# Patient Record
Sex: Male | Born: 1985 | Race: Black or African American | Hispanic: No | Marital: Single
Health system: Midwestern US, Community
[De-identification: ages and names within clinical notes are randomized; demographics above are authoritative.]

## PROBLEM LIST (undated history)

## (undated) DIAGNOSIS — R591 Generalized enlarged lymph nodes: Secondary | ICD-10-CM

## (undated) DIAGNOSIS — J984 Other disorders of lung: Secondary | ICD-10-CM

## (undated) DIAGNOSIS — R6510 Systemic inflammatory response syndrome (SIRS) of non-infectious origin without acute organ dysfunction: Secondary | ICD-10-CM

## (undated) DIAGNOSIS — G932 Benign intracranial hypertension: Secondary | ICD-10-CM

## (undated) DIAGNOSIS — J189 Pneumonia, unspecified organism: Secondary | ICD-10-CM

## (undated) DIAGNOSIS — B451 Cerebral cryptococcosis: Secondary | ICD-10-CM

## (undated) DIAGNOSIS — G9340 Encephalopathy, unspecified: Secondary | ICD-10-CM

## (undated) DIAGNOSIS — R945 Abnormal results of liver function studies: Secondary | ICD-10-CM

## (undated) DIAGNOSIS — N179 Acute kidney failure, unspecified: Secondary | ICD-10-CM

## (undated) DIAGNOSIS — I749 Embolism and thrombosis of unspecified artery: Secondary | ICD-10-CM

## (undated) DIAGNOSIS — G039 Meningitis, unspecified: Secondary | ICD-10-CM

## (undated) DIAGNOSIS — R918 Other nonspecific abnormal finding of lung field: Secondary | ICD-10-CM

## (undated) DIAGNOSIS — E871 Hypo-osmolality and hyponatremia: Secondary | ICD-10-CM

## (undated) DIAGNOSIS — B457 Disseminated cryptococcosis: Secondary | ICD-10-CM

## (undated) DIAGNOSIS — K611 Rectal abscess: Secondary | ICD-10-CM

## (undated) DIAGNOSIS — E43 Unspecified severe protein-calorie malnutrition: Secondary | ICD-10-CM

## (undated) DIAGNOSIS — L270 Generalized skin eruption due to drugs and medicaments taken internally: Secondary | ICD-10-CM

## (undated) DIAGNOSIS — D649 Anemia, unspecified: Secondary | ICD-10-CM

## (undated) DIAGNOSIS — B20 Human immunodeficiency virus [HIV] disease: Secondary | ICD-10-CM

## (undated) DIAGNOSIS — J9600 Acute respiratory failure, unspecified whether with hypoxia or hypercapnia: Secondary | ICD-10-CM

## (undated) HISTORY — DX: Cerebral cryptococcosis: B45.1

## (undated) HISTORY — DX: Meningitis, unspecified: G03.9

## (undated) HISTORY — DX: Benign intracranial hypertension: G93.2

## (undated) HISTORY — DX: Hypo-osmolality and hyponatremia: E87.1

## (undated) HISTORY — DX: Pneumonia, unspecified organism: J18.9

## (undated) HISTORY — DX: Other disorders of lung: J98.4

## (undated) HISTORY — DX: Human immunodeficiency virus (HIV) disease: B20

## (undated) HISTORY — DX: Acute respiratory failure, unspecified whether with hypoxia or hypercapnia: J96.00

## (undated) HISTORY — DX: Encephalopathy, unspecified: G93.40

## (undated) HISTORY — DX: Anemia, unspecified: D64.9

## (undated) HISTORY — DX: Generalized enlarged lymph nodes: R59.1

## (undated) HISTORY — DX: Generalized skin eruption due to drugs and medicaments taken internally: L27.0

## (undated) HISTORY — DX: Unspecified severe protein-calorie malnutrition: E43

## (undated) HISTORY — DX: Hypomagnesemia: E83.42

## (undated) HISTORY — DX: Acute kidney failure, unspecified: N17.9

## (undated) HISTORY — DX: Disseminated cryptococcosis: B45.7

## (undated) HISTORY — DX: Other nonspecific abnormal finding of lung field: R91.8

## (undated) HISTORY — DX: Systemic inflammatory response syndrome (sirs) of non-infectious origin without acute organ dysfunction: R65.10

## (undated) HISTORY — PX: INCISION AND DRAINAGE PERIRECTAL ABSCESS: SHX1804

## (undated) HISTORY — DX: Embolism and thrombosis of unspecified artery: I74.9

## (undated) HISTORY — DX: Abnormal results of liver function studies: R94.5

---

## 2008-04-04 HISTORY — PX: FLEXIBLE SIGMOIDOSCOPY: SHX1649

## 2008-04-04 LAB — HM SIGMOIDOSCOPY

## 2011-06-20 DIAGNOSIS — G8929 Other chronic pain: Secondary | ICD-10-CM | POA: Diagnosis present

## 2016-04-04 ENCOUNTER — Emergency Department: Admit: 2016-04-04 | Payer: Self-pay | Primary: Family Medicine

## 2016-04-04 ENCOUNTER — Inpatient Hospital Stay
Admit: 2016-04-04 | Discharge: 2016-04-04 | Disposition: A | Discharge status: Home / Self Care | Payer: Self-pay | Attending: Emergency Medicine

## 2016-04-04 DIAGNOSIS — R1084 Generalized abdominal pain: Secondary | ICD-10-CM

## 2016-04-04 LAB — HEPATIC FUNCTION PANEL
A-G Ratio: 0.6 — ABNORMAL LOW (ref 0.8–1.7)
ALT (SGPT): 15 U/L — ABNORMAL LOW (ref 16–61)
AST (SGOT): 17 U/L (ref 15–37)
Albumin: 3.3 g/dL — ABNORMAL LOW (ref 3.4–5.0)
Alk. phosphatase: 83 U/L (ref 45–117)
Bilirubin, direct: 0.2 MG/DL (ref 0.0–0.2)
Bilirubin, total: 0.4 MG/DL (ref 0.2–1.0)
Globulin: 5.5 g/dL — ABNORMAL HIGH (ref 2.0–4.0)
Protein, total: 8.8 g/dL — ABNORMAL HIGH (ref 6.4–8.2)

## 2016-04-04 LAB — LIPASE: Lipase: 140 U/L (ref 73–393)

## 2016-04-04 LAB — URINALYSIS W/ RFLX MICROSCOPIC
Bilirubin: NEGATIVE
Blood: NEGATIVE
Glucose: NEGATIVE mg/dL
Ketone: NEGATIVE mg/dL
Leukocyte Esterase: NEGATIVE
Nitrites: NEGATIVE
Specific gravity: 1.026 (ref 1.005–1.030)
Urobilinogen: 2 EU/dL — ABNORMAL HIGH (ref 0.2–1.0)
pH (UA): 6.5 (ref 5.0–8.0)

## 2016-04-04 LAB — CBC WITH AUTOMATED DIFF
ABS. BASOPHILS: 0 10*3/uL (ref 0.0–0.06)
ABS. EOSINOPHILS: 0.1 10*3/uL (ref 0.0–0.4)
ABS. LYMPHOCYTES: 0.9 10*3/uL (ref 0.9–3.6)
ABS. MONOCYTES: 0.5 10*3/uL (ref 0.05–1.2)
ABS. NEUTROPHILS: 3.2 10*3/uL (ref 1.8–8.0)
BASOPHILS: 0 % (ref 0–2)
EOSINOPHILS: 2 % (ref 0–5)
HCT: 34.2 % — ABNORMAL LOW (ref 36.0–48.0)
HGB: 10.9 g/dL — ABNORMAL LOW (ref 13.0–16.0)
LYMPHOCYTES: 19 % — ABNORMAL LOW (ref 21–52)
MCH: 27.7 PG (ref 24.0–34.0)
MCHC: 31.9 g/dL (ref 31.0–37.0)
MCV: 87 FL (ref 74.0–97.0)
MONOCYTES: 10 % (ref 3–10)
MPV: 10.8 FL (ref 9.2–11.8)
NEUTROPHILS: 69 % (ref 40–73)
PLATELET: 170 10*3/uL (ref 135–420)
RBC: 3.93 M/uL — ABNORMAL LOW (ref 4.70–5.50)
RDW: 13.5 % (ref 11.6–14.5)
WBC: 4.7 10*3/uL (ref 4.6–13.2)

## 2016-04-04 LAB — URINE MICROSCOPIC ONLY
RBC: 0 /hpf (ref 0–5)
WBC: 0 /hpf (ref 0–4)

## 2016-04-04 LAB — METABOLIC PANEL, BASIC
Anion gap: 4 mmol/L (ref 3.0–18)
BUN/Creatinine ratio: 17 (ref 12–20)
BUN: 13 MG/DL (ref 7.0–18)
CO2: 27 mmol/L (ref 21–32)
Calcium: 8.4 MG/DL — ABNORMAL LOW (ref 8.5–10.1)
Chloride: 106 mmol/L (ref 100–108)
Creatinine: 0.75 MG/DL (ref 0.6–1.3)
GFR est AA: >60 mL/min/{1.73_m2} (ref >60)
GFR est non-AA: >60 mL/min/{1.73_m2} (ref >60)
Glucose: 88 mg/dL (ref 74–99)
Potassium: 3.5 mmol/L (ref 3.5–5.5)
Sodium: 137 mmol/L (ref 136–145)

## 2016-04-04 MED ORDER — TRAMADOL 50 MG TAB
50 mg | ORAL_TABLET | Freq: Four times a day (QID) | ORAL | 0 refills | Status: AC | PRN
Start: 2016-04-04 — End: ?

## 2016-04-04 MED ORDER — NAPROXEN 500 MG TAB
500 mg | ORAL_TABLET | Freq: Two times a day (BID) | ORAL | 0 refills | Status: AC
Start: 2016-04-04 — End: 2016-04-14

## 2016-04-04 MED ORDER — POLYETHYLENE GLYCOL 3350 100 % ORAL POWDER
17 gram/dose | Freq: Every day | ORAL | 0 refills | Status: AC
Start: 2016-04-04 — End: ?

## 2016-04-04 MED ORDER — KETOROLAC TROMETHAMINE 30 MG/ML INJECTION
30 mg/mL (1 mL) | INTRAMUSCULAR | Status: AC
Start: 2016-04-04 — End: 2016-04-04
  Administered 2016-04-04: 08:00:00 via INTRAVENOUS

## 2016-04-04 MED ORDER — METHYLPREDNISOLONE 4 MG TABS IN A DOSE PACK
4 mg | ORAL | 0 refills | Status: AC
Start: 2016-04-04 — End: ?

## 2016-04-04 MED FILL — KETOROLAC TROMETHAMINE 30 MG/ML INJECTION: 30 mg/mL (1 mL) | INTRAMUSCULAR | Qty: 1

## 2016-04-04 NOTE — ED Provider Notes (Signed)
HPI Comments: 30yo M c/o back pain, abdominal pain, and testicular pain.  Back pain started 2 days ago after a pallet fell and hit him in the lower back.  It has been sore since then.  [name] still ambulatory.  The pain shoots down the right posterior thigh and occasionally causes numbness and tingling in the toes.  No weakness or permanent numbness in the extremities.  No urinary incontinence.  Since yesterday he has been having lower abdominal pain and bilateral testicular pain.  No swelling.  Started around noon.  Denies nausea, vomiting, diarrhea.  No change in appetite.  Denies dysuria, hematuria, penile discharge.  No fevers.  The pain has been dull and constant.      Patient is a 30 y.o. male presenting with back pain.   Back Pain    Associated symptoms include abdominal pain. Pertinent negatives include no chest pain, no fever and no dysuria.        History reviewed. No pertinent past medical history.    History reviewed. No pertinent surgical history.      History reviewed. No pertinent family history.    Social History     Social History   ??? Marital status: SINGLE     Spouse name: N/A   ??? Number of children: N/A   ??? Years of education: N/A     Occupational History   ??? Not on file.     Social History Main Topics   ??? Smoking status: Current Every Day Smoker     Packs/day: 0.25     Years: 15.00   ??? Smokeless tobacco: Not on file   ??? Alcohol use No   ??? Drug use: No   ??? Sexual activity: Not on file     Other Topics Concern   ??? Not on file     Social History Narrative   ??? No narrative on file         ALLERGIES: Review of patient's allergies indicates no known allergies.    Review of Systems   Constitutional: Negative for fever.   HENT: Negative for facial swelling.    Eyes: Negative for visual disturbance.   Respiratory: Negative for shortness of breath.    Cardiovascular: Negative for chest pain.   Gastrointestinal: Positive for abdominal pain. Negative for constipation, diarrhea, nausea and vomiting.    Genitourinary: Positive for testicular pain. Negative for discharge, dysuria, penile pain and scrotal swelling.   Musculoskeletal: Positive for back pain. Negative for neck pain.   Skin: Negative for rash.   Neurological: Negative for dizziness.   Psychiatric/Behavioral: Negative for confusion.   All other systems reviewed and are negative.      Vitals:    04/04/16 0105   BP: 121/68   Pulse: 92   Resp: 16   Temp: 98.9 ??F (37.2 ??C)   SpO2: 99%   Weight: 77.1 kg (170 lb)            Physical Exam   Constitutional: He appears well-developed and well-nourished. No distress.   HENT:   Head: Normocephalic and atraumatic.   Eyes: Conjunctivae are normal.   Neck: Normal range of motion. Neck supple.   Cardiovascular: Normal rate.    Pulmonary/Chest: Effort normal.   Abdominal: Soft. There is tenderness in the right lower quadrant, suprapubic area and left lower quadrant.   Genitourinary: Right testis shows tenderness. Right testis shows no swelling. Left testis shows tenderness. Left testis shows no swelling. No discharge found.   Musculoskeletal:  Normal range of motion.   Right lower back tenderness, primarily muscular site, mild tenderness to L-spine.     Neurological: [name] alert.   Skin: Skin is warm and dry. [name] not diaphoretic.   Psychiatric: He has a normal mood and affect.   Nursing note and vitals reviewed.       MDM  Number of Diagnoses or Management Options  Abdominal pain, generalized: new and requires workup  Acute right-sided low back pain with right-sided sciatica: new and requires workup  Constipation, unspecified constipation type: new and requires workup  Testicular pain: new and requires workup  Diagnosis management comments: 30yo M with right back pain s/p trauma with sciatic symptoms, and lower abdominal pain with testicular pain.      [place] and CT negative- only issue constipation.  Labs WNL.  Treatment for lower back pain with sciatica.  Treatment for constipation.   Discussed  treatment plan, return precautions, symptomatic relief, and expected time to improvement.  All questions answered. Patient is stable for discharge and outpatient management.           Amount and/or Complexity of Data Reviewed  Clinical lab tests: ordered and reviewed  Tests in the radiology section of CPT??: ordered and reviewed      ED Course       Procedures      Diagnosis:   1. Acute right-sided low back pain with right-sided sciatica    2. Constipation, unspecified constipation type    3. Testicular pain    4. Abdominal pain, generalized          Disposition: home    Follow-up Information     Follow up With Details Comments Contact Info    [facility] EMERGENCY DEPT In 2 days If symptoms do not improve [address] [facility] [address]  [phone]    [facility] EMERGENCY DEPT  Immediately if symptoms worsen [address] [place] [address]  [phone]          Patient's Medications   Start Taking    METHYLPREDNISOLONE (MEDROL, PAK,) 4 MG TABLET    Take as directed    NAPROXEN (NAPROSYN) 500 MG TABLET    Take 1 Tab by mouth two (2) times daily (with meals) for 10 days.    POLYETHYLENE GLYCOL (MIRALAX) 17 GRAM/DOSE POWDER    Take 17 g by mouth daily. 1 tablespoon with 8 oz of water daily    TRAMADOL (ULTRAM) 50 MG TABLET    Take 1 Tab by mouth every six (6) hours as needed for Pain. Max Daily Amount: 200 mg.   Continue Taking    No medications on file   These Medications have changed    No medications on file   Stop Taking    No medications on file     Ladavia Lindenbaum A Joakim Huesman, PA-C

## 2016-04-04 NOTE — ED Triage Notes (Signed)
Pt states boxes fell on him at work on Friday, and back pain has gotten progressively worse, pain is across the lower back with radiation down to the right foot, pt describes pain as sharp, with numbness in right foot. Pt has pain with passing stool, states he has been "holding it" pt is able to ambulate but painful to walk, states the pain is better when he is supine, pt took a friends percocet 5 hours ago that seemed to reduce the pain.

## 2016-04-04 NOTE — ED Notes (Signed)
I have reviewed discharge instructions with the patient.  The patient verbalized understanding. The patient was discharged with three prescriptions.

## 2016-04-06 LAB — CHLAMYDIA/NEISSERIA AMPLIFICATION
Chlamydia amplification: NEGATIVE
N. gonorrhoeae amplification: NEGATIVE

## 2016-07-07 DIAGNOSIS — B20 Human immunodeficiency virus [HIV] disease: Secondary | ICD-10-CM | POA: Diagnosis present

## 2016-07-07 HISTORY — DX: Human immunodeficiency virus (HIV) disease: B20

## 2016-10-31 ENCOUNTER — Encounter (HOSPITAL_COMMUNITY): Payer: Self-pay | Admitting: Emergency Medicine

## 2016-10-31 ENCOUNTER — Emergency Department (HOSPITAL_COMMUNITY): Payer: Medicaid Other

## 2016-10-31 ENCOUNTER — Emergency Department (HOSPITAL_COMMUNITY)
Admission: EM | Admit: 2016-10-31 | Discharge: 2016-10-31 | Disposition: A | Discharge status: Home / Self Care | Payer: Medicaid Other | Attending: Emergency Medicine | Admitting: Emergency Medicine

## 2016-10-31 DIAGNOSIS — F17210 Nicotine dependence, cigarettes, uncomplicated: Secondary | ICD-10-CM | POA: Diagnosis not present

## 2016-10-31 DIAGNOSIS — L0231 Cutaneous abscess of buttock: Secondary | ICD-10-CM | POA: Diagnosis present

## 2016-10-31 DIAGNOSIS — L0291 Cutaneous abscess, unspecified: Secondary | ICD-10-CM

## 2016-10-31 DIAGNOSIS — Z79899 Other long term (current) drug therapy: Secondary | ICD-10-CM | POA: Insufficient documentation

## 2016-10-31 HISTORY — DX: Rectal abscess: K61.1

## 2016-10-31 HISTORY — DX: Human immunodeficiency virus (HIV) disease: B20

## 2016-10-31 LAB — BASIC METABOLIC PANEL
Anion gap: 5 (ref 5–15)
BUN: 13 mg/dL (ref 6–20)
CALCIUM: 8.3 mg/dL — AB (ref 8.9–10.3)
CHLORIDE: 106 mmol/L (ref 101–111)
CO2: 26 mmol/L (ref 22–32)
CREATININE: 0.84 mg/dL (ref 0.61–1.24)
GFR calc non Af Amer: >60 mL/min (ref >60)
GLUCOSE: 87 mg/dL (ref 65–99)
Potassium: 3.6 mmol/L (ref 3.5–5.1)
Sodium: 137 mmol/L (ref 135–145)

## 2016-10-31 LAB — CBC WITH DIFFERENTIAL/PLATELET
Basophils Absolute: 0 10*3/uL (ref 0.0–0.1)
Basophils Relative: 0 %
Eosinophils Absolute: 0.1 10*3/uL (ref 0.0–0.7)
Eosinophils Relative: 2 %
HCT: 27.9 % — ABNORMAL LOW (ref 39.0–52.0)
Hemoglobin: 8.8 g/dL — ABNORMAL LOW (ref 13.0–17.0)
Lymphocytes Relative: 11 %
Lymphs Abs: 0.8 10*3/uL (ref 0.7–4.0)
MCH: 26.2 pg (ref 26.0–34.0)
MCHC: 31.5 g/dL (ref 30.0–36.0)
MCV: 83 fL (ref 78.0–100.0)
Monocytes Absolute: 0.6 10*3/uL (ref 0.1–1.0)
Monocytes Relative: 8 %
Neutro Abs: 5.6 10*3/uL (ref 1.7–7.7)
Neutrophils Relative %: 79 %
Platelets: 306 10*3/uL (ref 150–400)
RBC: 3.36 MIL/uL — ABNORMAL LOW (ref 4.22–5.81)
RDW: 15.8 % — ABNORMAL HIGH (ref 11.5–15.5)
WBC: 7.1 10*3/uL (ref 4.0–10.5)

## 2016-10-31 LAB — I-STAT CHEM 8, ED
BUN: 12 mg/dL (ref 6–20)
Calcium, Ion: 1.16 mmol/L (ref 1.15–1.40)
Chloride: 105 mmol/L (ref 101–111)
Creatinine, Ser: 0.8 mg/dL (ref 0.61–1.24)
Glucose, Bld: 90 mg/dL (ref 65–99)
HCT: 29 % — ABNORMAL LOW (ref 39.0–52.0)
Hemoglobin: 9.9 g/dL — ABNORMAL LOW (ref 13.0–17.0)
Potassium: 4 mmol/L (ref 3.5–5.1)
Sodium: 142 mmol/L (ref 135–145)
TCO2: 27 mmol/L (ref 0–100)

## 2016-10-31 MED ORDER — HYDROMORPHONE HCL 1 MG/ML IJ SOLN
1.0000 mg | Freq: Once | INTRAMUSCULAR | Status: AC
  Administered 2016-10-31: 1 mg via INTRAVENOUS
  Filled 2016-10-31: qty 1

## 2016-10-31 MED ORDER — LIDOCAINE HCL 2 % IJ SOLN
20.0000 mL | Freq: Once | INTRAMUSCULAR | Status: AC
  Administered 2016-10-31: 400 mg
  Filled 2016-10-31: qty 20

## 2016-10-31 MED ORDER — OXYCODONE-ACETAMINOPHEN 5-325 MG PO TABS: 2.0000 | ORAL_TABLET | ORAL | 0 refills | Status: DC | PRN

## 2016-10-31 MED ORDER — SULFAMETHOXAZOLE-TRIMETHOPRIM 800-160 MG PO TABS: 1.0000 | ORAL_TABLET | Freq: Two times a day (BID) | ORAL | 0 refills | Status: AC

## 2016-10-31 MED ORDER — LIDOCAINE HCL 2 % IJ SOLN
INTRAMUSCULAR | Status: AC
  Filled 2016-10-31: qty 20

## 2016-10-31 MED ORDER — MORPHINE SULFATE (PF) 4 MG/ML IV SOLN
4.0000 mg | Freq: Once | INTRAVENOUS | Status: AC
  Administered 2016-10-31: 4 mg via INTRAVENOUS
  Filled 2016-10-31: qty 1

## 2016-10-31 MED ORDER — IOPAMIDOL (ISOVUE-300) INJECTION 61%
INTRAVENOUS | Status: AC
  Administered 2016-10-31: 100 mL
  Filled 2016-10-31: qty 100

## 2016-10-31 NOTE — ED Notes (Signed)
Patient saw seen leaving the department in patient gown. Patient was caught and brought back by staff. When back in the department patient asked for juice, stated he was cleared to eat and drink.

## 2016-10-31 NOTE — ED Notes (Signed)
Patient's phone # [phone]

## 2016-10-31 NOTE — Consult Note (Signed)
[facility] Surgery Consult Note  William Pena [birth year]  [id number].    Requesting MD: Roderic Palau, MD Chief Complaint/Reason for Consult: perirectal abscess  HPI:  William Pena is a 31 y/o male with a medical history significant for HIV and recurrent perirectal abscess who presented to [facility] with a cc of perirectal pain. According to the patient, and consistent with cart review in care everywhere, the patient had an I&D of a perirectal abscess 07/2016 but abscess worsened and a perianal fistula developed requiring another I&D with placement of a seton 08/2016. Today the patient reports perirectal pain that is described as constant and worse with sitting. [name] in [place] visiting family for Easter and did not want to come to the ED, but the pain became intolerable and he had to seek help. He reports his last bowel movement was today and denies hematochezia, melena, or purulent stools. He reports that at baseline he wears a maxi-pad to contain the constant, foul-smelling drainage from his seton. When asked about his compliance with HIV medications he admits that, until recently, he was not seeing an ID doctor regularly and was not compliant with his medications. He reports that the past couple of months he has been seeing Dr. Tobie Poet (ID) and Dr. Ronita Hipps (colorectal surgery) in [place]. He reports that he is struggling financially and that has contributed to his medical non-compliance. When asked about sitz baths he reports that he currently lives with a lot of people who share a bathroom and he does not feel it is clean enough for soaking. He states that paying for medication is difficult but he does have some family (aunt, mother) who are willing to help. He currently smokes 1 pack of cigarettes every 3 days and denies the use if marijuana or illicit drugs. He reports taking percocet 5-325 and oxycodone to control his pain as prescribed by his physicians in [place] (consistent with history on [place]  substance database, I personally reviewed). The patient does request that any future surgeries be performed by his current doctors in [place]. He has an appointment with his colorectal surgeon on 11/16/16.  ROS: Review of Systems  Constitutional: Negative for chills and fever.  Respiratory: Negative for cough, sputum production, shortness of breath and wheezing.   Cardiovascular: Negative for chest pain and palpitations.  Gastrointestinal: Negative for abdominal pain, blood in stool, constipation, diarrhea, heartburn, melena, nausea and vomiting.  Genitourinary: Negative.   All other systems reviewed and are negative.   No family history on file.  Past Medical History:  Diagnosis Date  . HIV (human immunodeficiency virus infection) ([place])   . Perirectal abscess     History reviewed. No pertinent surgical history.  Social History:  reports that he has been smoking Cigarettes.  He has been smoking about 0.33 packs per day. He does not have any smokeless tobacco history on file. He reports that he does not drink alcohol. His drug history is not on file.  Allergies: No Known Allergies   (Not in a hospital admission)  Blood pressure 112/71, pulse 77, temperature 98.1 F (36.7 C), temperature source Oral, resp. rate 16, SpO2 97 %. Physical Exam: Physical Exam  Constitutional: He appears well-developed. No distress.  HENT:  Head: Normocephalic and atraumatic.  Mouth/Throat: Oropharynx is clear and moist.  Eyes: EOM are normal. Pupils are equal, round, and reactive to light.  Neck: Normal range of motion. Neck supple. No tracheal deviation present. No thyromegaly present.  Cardiovascular: Normal rate, regular rhythm, normal heart sounds and  intact distal pulses.   Pulmonary/Chest: Effort normal and breath sounds normal. No stridor. No respiratory distress.  Abdominal: Soft. Bowel sounds are normal. He exhibits no distension and no mass. There is no tenderness. There is no guarding.   Genitourinary:  Genitourinary Comments: plastic and silk seton in right, medial gluteal cleft with what appears to be a well-healing wound superiorly - no visible granulation tissue. Two <1cm abscesses inferior to the seton that are tender to palpation without significant surrounding induration or erythema. There is no drainage. The anal sphincter is in tact.   Skin: [name] not diaphoretic.    Results for orders placed or performed during the hospital encounter of 10/31/16 (from the past 48 hour(s))  CBC with Differential     Status: Abnormal   Collection Time: 10/31/16  9:21 AM  Result Value Ref Range   WBC 7.1 4.0 - 10.5 K/uL   RBC 3.36 (L) 4.22 - 5.81 MIL/uL   Hemoglobin 8.8 (L) 13.0 - 17.0 g/dL   HCT 27.9 (L) 39.0 - 52.0 %   MCV 83.0 78.0 - 100.0 fL   MCH 26.2 26.0 - 34.0 pg   MCHC 31.5 30.0 - 36.0 g/dL   RDW 15.8 (H) 11.5 - 15.5 %   Platelets 306 150 - 400 K/uL   Neutrophils Relative % 79 %   Neutro Abs 5.6 1.7 - 7.7 K/uL   Lymphocytes Relative 11 %   Lymphs Abs 0.8 0.7 - 4.0 K/uL   Monocytes Relative 8 %   Monocytes Absolute 0.6 0.1 - 1.0 K/uL   Eosinophils Relative 2 %   Eosinophils Absolute 0.1 0.0 - 0.7 K/uL   Basophils Relative 0 %   Basophils Absolute 0.0 0.0 - 0.1 K/uL  Basic metabolic panel     Status: Abnormal   Collection Time: 10/31/16 10:09 AM  Result Value Ref Range   Sodium 137 135 - 145 mmol/L   Potassium 3.6 3.5 - 5.1 mmol/L   Chloride 106 101 - 111 mmol/L   CO2 26 22 - 32 mmol/L   Glucose, Bld 87 65 - 99 mg/dL   BUN 13 6 - 20 mg/dL   Creatinine, Ser 0.84 0.61 - 1.24 mg/dL   Calcium 8.3 (L) 8.9 - 10.3 mg/dL   GFR calc non Af Amer >60 >60 mL/min   GFR calc Af Amer >60 >60 mL/min    Comment: (NOTE) The eGFR has been calculated using the CKD EPI equation. This calculation has not been validated in all clinical situations. eGFR's persistently <60 mL/min signify possible Chronic Kidney Disease.    Anion gap 5 5 - 15  I-Stat Chem 8, ED     Status:  Abnormal   Collection Time: 10/31/16 11:25 AM  Result Value Ref Range   Sodium 142 135 - 145 mmol/L   Potassium 4.0 3.5 - 5.1 mmol/L   Chloride 105 101 - 111 mmol/L   BUN 12 6 - 20 mg/dL   Creatinine, Ser 0.80 0.61 - 1.24 mg/dL   Glucose, Bld 90 65 - 99 mg/dL   Calcium, Ion 1.16 1.15 - 1.40 mmol/L   TCO2 27 0 - 100 mmol/L   Hemoglobin 9.9 (L) 13.0 - 17.0 g/dL   HCT 29.0 (L) 39.0 - 52.0 %   Ct Abdomen Pelvis W Contrast  Result Date: 10/31/2016 CLINICAL DATA:  Perirectal abscesses since December, status post multiple surgeries, with drains in place EXAM: CT ABDOMEN AND PELVIS WITH CONTRAST TECHNIQUE: Multidetector CT imaging of the abdomen and pelvis  was performed using the standard protocol following bolus administration of intravenous contrast. CONTRAST:  150m ISOVUE-300 IOPAMIDOL (ISOVUE-300) INJECTION 61% COMPARISON:  None. FINDINGS: Lower chest: Lung bases are clear. Hepatobiliary: Liver is within normal limits. Subcentimeter gallstone (series 2/image 32), without associated inflammatory changes. Pancreas: Within normal limits. Spleen: Within normal limits. Adrenals/Urinary Tract: Adrenal glands are within normal limits. Kidneys are within normal limits. Bladder is within normal limits. Stomach/Bowel: Stomach is within normal limits. No evidence of bowel obstruction. Prior appendectomy. Wall thickening involving the distal rectum. Phlegmonous change along the posterior aspect of the anorectal region (series 2/image 95), although poorly evaluated on CT. Suspected fistulous communication along the 7 o'clock position (series 2/image 99), leading to a 3.8 x 1.5 cm subcutaneous abscess in the medial right buttock (series 3/image 6), with suspected cutaneous drainage (series 3/image 5). Additional surgical drain is present but incompletely visualized (series 2/ image 97). Vascular/Lymphatic: No evidence of abdominal aortic aneurysm. No suspicious abdominopelvic lymphadenopathy. Reproductive: Prostate is  grossly unremarkable. Other: No abdominopelvic ascites. Trace presacral fluid (series 2/ image 81). Musculoskeletal: Visualized osseous structures are within normal limits. IMPRESSION: Wall thickening/inflammatory changes involving the distal rectum. Suspected anorectal fistula at the 7 o'clock position, although poorly evaluated on CT. Associated 3.8 x 1.5 subcutaneous abscess in the medial right buttock, with suspected cutaneous drainage. Electronically Signed   By: SJulian HyM.D.   On: 10/31/2016 12:56   Assessment/Plan Recurrent perirectal abscesses with perianal fistula, in the setting of uncontrolled HIV. I discussed with the patient the importance of continued compliance with his HIV medications, as well as smoking cessation, and the role it plays in wound healing. At this time the patient is afebrile, vitals stable, no leukocytosis, and he is having regular bowel function. He has no acute surgical needs and I think it would be better for his continuity of care and long-term prognosis if he followed up with Dr. MRonita Hipps I recommend discharge with: - outpatient surgical follow up with Dr. MRonita Hipps this week if possible.  -  2 weeks of oral antibiotic therapy - PO pain control with Tylenol, ibuprofen, and oxycodone.  - Good hygiene and daily bathing; TID sitz baths if he gets access to a clean tub.  EJill Alexanders [facility] 10/31/2016, 2:37 PM Pager: [phone]: [phone]-Fri 7:00 am-4:30 pm Sat-Sun 7:00 am-11:30 am

## 2016-10-31 NOTE — ED Triage Notes (Signed)
Pt c/o perirectal abscesses onset in December, multiple surgeries performed, drains still in place from last surgery, new abscesses, very painful with malodorous drainage.

## 2016-10-31 NOTE — ED Notes (Signed)
Patient given 3 cups of juice.

## 2016-10-31 NOTE — ED Provider Notes (Signed)
[facility] DEPT Provider Note   CSN: [id number] Arrival date & time: 10/31/16  0827     History   Chief Complaint Chief Complaint  Patient presents with  . Abscess    HPI William Pena is a 31 y.o. male.  HPI   31 year old male with a history of HIV presents today with perirectal abscess.  Patient is in town visiting family for the holiday, his infectious disease specialist and Psychologist, sport and exercise is at [employer].  Patient notes that in January of this year he underwent surgery for recurring persisting perirectal abscess and gluteal abscess with a seton placed.  Patient notes that he has had waxing and waning of symptoms, notes over the last 2 days he has had worsening pain around the surgical site.  Patient notes pain with defecation, denies any blood or pus with his stool.  Patient denies any fevers, chills, nausea or vomiting.  Patient notes he has run out of his pain medication.  Patient is not taking his daily Bactrim as he ran out approximately 1 week ago.    Past Medical History:  Diagnosis Date  . HIV (human immunodeficiency virus infection) ([place])   . Perirectal abscess     There are no active problems to display for this patient.   History reviewed. No pertinent surgical history.     Home Medications    Prior to Admission medications   Medication Sig Start Date End Date Taking? Authorizing Provider  oxyCODONE-acetaminophen (PERCOCET/ROXICET) 5-325 MG tablet Take 2 tablets by mouth every 4 (four) hours as needed for severe pain. 10/31/16   Okey Regal, PA-C  sulfamethoxazole-trimethoprim (BACTRIM DS,SEPTRA DS) 800-160 MG tablet Take 1 tablet by mouth 2 (two) times daily. 10/31/16 11/07/16  Okey Regal, PA-C    Family History No family history on file.  Social History Social History  Substance Use Topics  . Smoking status: Current Every Day Smoker    Packs/day: 0.33    Types: Cigarettes  . Smokeless tobacco: Not on file  . Alcohol use No     Allergies     Patient has no known allergies.   Review of Systems Review of Systems  All other systems reviewed and are negative.    Physical Exam Updated Vital Signs BP 116/75 (BP Location: Right Arm)   Pulse 77   Temp 98.1 F (36.7 C) (Oral)   Resp 16   SpO2 97%   Physical Exam  Constitutional: [name] oriented to person, place, and time. He appears well-developed and well-nourished.  HENT:  Head: Normocephalic and atraumatic.  Eyes: Conjunctivae are normal. Pupils are equal, round, and reactive to light. Right eye exhibits no discharge. Left eye exhibits no discharge. No scleral icterus.  Neck: Normal range of motion. No JVD present. No tracheal deviation present.  Pulmonary/Chest: Effort normal. No stridor.  Genitourinary:  Genitourinary Comments: Seton drain noted in right perirectal region w/localized swelling, but no erythema or obvious fluctuance noted. No obvious anal sphincter involvement.  Two separate areas of fluctuance noted to right buttocks  Neurological: [name] alert and oriented to person, place, and time. Coordination normal.  Psychiatric: He has a normal mood and affect. His behavior is normal. Judgment and thought content normal.  Nursing note and vitals reviewed.    ED Treatments / Results  Labs (all labs ordered are listed, but only abnormal results are displayed) Labs Reviewed  CBC WITH DIFFERENTIAL/PLATELET - Abnormal; Notable for the following:       Result Value   RBC 3.36 (*)  Hemoglobin 8.8 (*)    HCT 27.9 (*)    RDW 15.8 (*)    All other components within normal limits  BASIC METABOLIC PANEL - Abnormal; Notable for the following:    Calcium 8.3 (*)    All other components within normal limits  I-STAT CHEM 8, ED - Abnormal; Notable for the following:    Hemoglobin 9.9 (*)    HCT 29.0 (*)    All other components within normal limits    EKG  EKG Interpretation None       Radiology Ct Abdomen Pelvis W Contrast  Result Date:  10/31/2016 CLINICAL DATA:  Perirectal abscesses since December, status post multiple surgeries, with drains in place EXAM: CT ABDOMEN AND PELVIS WITH CONTRAST TECHNIQUE: Multidetector CT imaging of the abdomen and pelvis was performed using the standard protocol following bolus administration of intravenous contrast. CONTRAST:  132mL ISOVUE-300 IOPAMIDOL (ISOVUE-300) INJECTION 61% COMPARISON:  None. FINDINGS: Lower chest: Lung bases are clear. Hepatobiliary: Liver is within normal limits. Subcentimeter gallstone (series 2/image 32), without associated inflammatory changes. Pancreas: Within normal limits. Spleen: Within normal limits. Adrenals/Urinary Tract: Adrenal glands are within normal limits. Kidneys are within normal limits. Bladder is within normal limits. Stomach/Bowel: Stomach is within normal limits. No evidence of bowel obstruction. Prior appendectomy. Wall thickening involving the distal rectum. Phlegmonous change along the posterior aspect of the anorectal region (series 2/image 95), although poorly evaluated on CT. Suspected fistulous communication along the 7 o'clock position (series 2/image 99), leading to a 3.8 x 1.5 cm subcutaneous abscess in the medial right buttock (series 3/image 6), with suspected cutaneous drainage (series 3/image 5). Additional surgical drain is present but incompletely visualized (series 2/ image 97). Vascular/Lymphatic: No evidence of abdominal aortic aneurysm. No suspicious abdominopelvic lymphadenopathy. Reproductive: Prostate is grossly unremarkable. Other: No abdominopelvic ascites. Trace presacral fluid (series 2/ image 81). Musculoskeletal: Visualized osseous structures are within normal limits. IMPRESSION: Wall thickening/inflammatory changes involving the distal rectum. Suspected anorectal fistula at the 7 o'clock position, although poorly evaluated on CT. Associated 3.8 x 1.5 subcutaneous abscess in the medial right buttock, with suspected cutaneous drainage.  Electronically Signed   By: Julian Hy M.D.   On: 10/31/2016 12:56    Procedures Procedures (including critical care time)  INCISION AND DRAINAGE Performed by: Elmer Ramp Consent: Verbal consent obtained. Risks and benefits: risks, benefits and alternatives were discussed Type: abscess  Body area: Buttocks  Anesthesia: local infiltration  Incision was made with a scalpel.  Local anesthetic: lidocaine 2 % 0 epinephrine  Anesthetic total: 4 ml  Complexity: complex Blunt dissection to break up loculations  Drainage: purulent  Drainage amount: Scant  Packing material:   Patient tolerance: Patient tolerated the procedure well with no immediate complications.   Medications Ordered in ED Medications  morphine 4 MG/ML injection 4 mg (4 mg Intravenous Given 10/31/16 0930)  morphine 4 MG/ML injection 4 mg (4 mg Intravenous Given 10/31/16 1148)  iopamidol (ISOVUE-300) 61 % injection (100 mLs  Contrast Given 10/31/16 1226)  HYDROmorphone (DILAUDID) injection 1 mg (1 mg Intravenous Given 10/31/16 1411)  lidocaine (XYLOCAINE) 2 % (with pres) injection 400 mg (400 mg Infiltration Given by Other 10/31/16 1458)  HYDROmorphone (DILAUDID) injection 1 mg (1 mg Intravenous Given 10/31/16 1639)     Initial Impression / Assessment and Plan / ED Course  I have reviewed the triage vital signs and the nursing notes.  Pertinent labs & imaging results that were available during my care of the patient were reviewed  by me and considered in my medical decision making (see chart for details).      Final Clinical Impressions(s) / ED Diagnoses   Final diagnoses:  Abscess      Assessment/Plan: 31 year old male presents today with rectal abscess.  CT scan shows questionable fistula.  General surgery was consulted who evaluated patient who recommended outpatient management with antibiotics and close follow-up with the surgeon.  Patient is very well-appearing afebrile nontoxic.  He did  have 2 separate distinct abscess that I I&D here.  Patient will follow up with infectious disease and surgery tomorrow.  [name] given strict return precautions, he verbalized understanding and agreement to today's plan had no further questions or concerns.     New Prescriptions Discharge Medication List as of 10/31/2016  4:20 PM    START taking these medications   Details  oxyCODONE-acetaminophen (PERCOCET/ROXICET) 5-325 MG tablet Take 2 tablets by mouth every 4 (four) hours as needed for severe pain., Starting Mon 10/31/2016, Print    sulfamethoxazole-trimethoprim (BACTRIM DS,SEPTRA DS) 800-160 MG tablet Take 1 tablet by mouth 2 (two) times daily., Starting Mon 10/31/2016, Until Mon 11/07/2016, Print         Okey Regal, PA-C 10/31/16 [place], MD 11/01/16 [phone]

## 2016-10-31 NOTE — Discharge Instructions (Signed)
Please read attached information.  Please follow-up with both your infectious disease specialist and surgeon tomorrow informing him of today's visit and all relevant data.  Please take antibiotics as directed, pain medication as prescribed.  If you develop any new or worsening signs or symptoms follow-up immediately.  Please continue taking all prescribed medications.

## 2016-10-31 NOTE — ED Notes (Signed)
Discharge instructions, follow up care, and rx x2 reviewed with patient. Patient verbalized understanding. 

## 2016-12-15 ENCOUNTER — Encounter (HOSPITAL_COMMUNITY): Payer: Self-pay | Admitting: Emergency Medicine

## 2016-12-15 ENCOUNTER — Emergency Department (HOSPITAL_COMMUNITY)
Admission: EM | Admit: 2016-12-15 | Discharge: 2016-12-15 | Disposition: A | Discharge status: Home / Self Care | Attending: Emergency Medicine | Admitting: Emergency Medicine

## 2016-12-15 DIAGNOSIS — K6289 Other specified diseases of anus and rectum: Secondary | ICD-10-CM | POA: Diagnosis not present

## 2016-12-15 DIAGNOSIS — Y939 Activity, unspecified: Secondary | ICD-10-CM | POA: Insufficient documentation

## 2016-12-15 DIAGNOSIS — Y999 Unspecified external cause status: Secondary | ICD-10-CM | POA: Insufficient documentation

## 2016-12-15 DIAGNOSIS — Z87891 Personal history of nicotine dependence: Secondary | ICD-10-CM | POA: Insufficient documentation

## 2016-12-15 DIAGNOSIS — W1839XA Other fall on same level, initial encounter: Secondary | ICD-10-CM | POA: Diagnosis not present

## 2016-12-15 DIAGNOSIS — S0990XA Unspecified injury of head, initial encounter: Secondary | ICD-10-CM | POA: Diagnosis present

## 2016-12-15 DIAGNOSIS — Y929 Unspecified place or not applicable: Secondary | ICD-10-CM | POA: Diagnosis not present

## 2016-12-15 MED ORDER — IBUPROFEN 800 MG PO TABS: 800.0000 mg | ORAL_TABLET | Freq: Three times a day (TID) | ORAL | 0 refills | Status: DC | PRN

## 2016-12-15 MED ORDER — IBUPROFEN 800 MG PO TABS
800.0000 mg | ORAL_TABLET | Freq: Once | ORAL | Status: AC
  Administered 2016-12-15: 800 mg via ORAL
  Filled 2016-12-15: qty 1

## 2016-12-15 NOTE — ED Provider Notes (Signed)
Emergency Department Provider Note   I have reviewed the triage vital signs and the nursing notes.   HISTORY  Chief Complaint Fall; Head Injury; and Abscess   HPI William Pena is a 31 y.o. male with PMH of HIV and chronic perirectal abscess presents to the emergency department for evaluation of rectal pain and fall with head injury. The patient has chronic problems with rectal abscess. He said multiple drainages in the past. [name] having pain in the area with some drainage. No fevers or chills. No lower abdominal pain. Patient is followed by a team at [facility] by report. Patient also had a fall in the shower yesterday evening. He states he slipped and fell striking the back of his head. No loss of consciousness. No nausea or vomiting since the incident. He has been experiencing a throbbing headache and difficulty concentrating. No difficulty walking. No neck pain.    Past Medical History:  Diagnosis Date  . HIV (human immunodeficiency virus infection) ([place])   . Perirectal abscess     There are no active problems to display for this patient.   History reviewed. No pertinent surgical history.  Current Outpatient Rx  . Order #: [id number] Class: Historical Med  . Order #: [id number] Class: Historical Med  . Order #: [id number] Class: Historical Med  . Order #: [id number] Class: Print    Allergies Patient has no known allergies.  History reviewed. No pertinent family history.  Social History Social History  Substance Use Topics  . Smoking status: Former Smoker    Packs/day: 0.33    Types: Cigarettes  . Smokeless tobacco: Never Used  . Alcohol use No    Review of Systems  Constitutional: No fever/chills Eyes: No visual changes. ENT: No sore throat. Cardiovascular: Denies chest pain. Respiratory: Denies shortness of breath. Gastrointestinal: No abdominal pain.  No nausea, no vomiting.  No diarrhea.  No constipation. Genitourinary: Negative for  dysuria. Musculoskeletal: Negative for back pain. Skin: Negative for rash. Positive healing rectal abscess.  Neurological: Negative for focal weakness or numbness. Positive HA.   10-point ROS otherwise negative.  ____________________________________________   PHYSICAL EXAM:  VITAL SIGNS: ED Triage Vitals [12/15/16 0543]  Enc Vitals Group     BP (!) 141/82     Pulse Rate 90     Resp 18     Temp 99.6 F (37.6 C)     Temp Source Oral     SpO2 98 %     Pain Score 10   Constitutional: Alert and oriented. Well appearing and in no acute distress. Eyes: Conjunctivae are normal. Head: Atraumatic. Normal TMs bilaterally with no other signs of basilar skull fracture.  Nose: No congestion/rhinnorhea. Mouth/Throat: Mucous membranes are moist.  Oropharynx non-erythematous. Neck: No stridor.  No meningeal signs.   Cardiovascular: Normal rate, regular rhythm. Good peripheral circulation. Grossly normal heart sounds.   Respiratory: Normal respiratory effort.  No retractions. Lungs CTAB. Gastrointestinal: Soft and nontender. No distention. Healing perirectal abscess with primrose drain in place. No area of new fluid collection, erythema, or induration.  Musculoskeletal: No lower extremity tenderness nor edema. No gross deformities of extremities. Neurologic:  Normal speech and language. No gross focal neurologic deficits are appreciated.  Skin:  Skin is warm, dry and intact. No rash noted. Psychiatric: Mood and affect are normal. Speech and behavior are normal.  ____________________________________________   PROCEDURES  Procedure(s) performed:   Procedures  None ____________________________________________   INITIAL IMPRESSION / ASSESSMENT AND PLAN / ED COURSE  Pertinent labs &  imaging results that were available during my care of the patient were reviewed by me and considered in my medical decision making (see chart for details).  Patient resents to the emergency department for  evaluation of fall in the shower yesterday with acute on chronic pain from rectal abscess. Patient with no evidence of acute abscess formation. He has several areas that of recently been incised and drained with a primrose drain in place. No erythema, fluctuance, induration. Plan for him to continue his Bactrim and follow with his primary care physician [facility] leaving jail. He showed no evidence of head trauma. No evidence of basilar skull fracture. I do not see an indication for head CT at this time. Suspect mild concussion symptoms. Will discharge back to [facility].   At this time, I do not feel there is any life-threatening condition present. I have reviewed and discussed all results (EKG, imaging, lab, urine as appropriate), exam findings with patient. I have reviewed nursing notes and appropriate previous records.  I feel the patient is safe to be discharged home without further emergent workup. Discussed usual and customary return precautions. Patient and family (if present) verbalize understanding and are comfortable with this plan.  Patient will follow-up with their primary care provider. If they do not have a primary care provider, information for follow-up has been provided to them. All questions have been answered.  ____________________________________________  FINAL CLINICAL IMPRESSION(S) / ED DIAGNOSES  Final diagnoses:  Injury of head, initial encounter  Rectal pain     MEDICATIONS GIVEN DURING THIS VISIT:  Medications  ibuprofen (ADVIL,MOTRIN) tablet 800 mg (800 mg Oral Given 12/15/16 0734)     NEW OUTPATIENT MEDICATIONS STARTED DURING THIS VISIT:  None   Note:  This document was prepared using Dragon voice recognition software and may include unintentional dictation errors.  Nanda Quinton, MD Emergency Medicine   Syann Cupples, [facility], MD 12/16/16 [phone]

## 2016-12-15 NOTE — ED Triage Notes (Signed)
[facility] [place] [facility] [place] he fell in the shower yesterday and hit his head  Pt denies LOC  Pt state since he fell he has had nausea, vomiting, and blurred vision and has had a constant migraine  Pt states he also has rectal abscess that is draining

## 2016-12-15 NOTE — Discharge Instructions (Signed)
You were seen in the Emergency Department (ED) today for a head injury.  Based on your evaluation, you may have sustained a concussion (or bruise) to your brain.  If you had a CT scan done, it did not show any evidence of serious injury or bleeding.    Continue taking your antibiotics for the healing rectal abscess. Those appear to be healing well at this time.   Symptoms to expect from a concussion include nausea, mild to moderate headache, difficulty concentrating or sleeping, and mild lightheadedness.  These symptoms should improve over the next few days to weeks, but it may take many weeks before you feel back to normal.  Return to the emergency department or follow-up with your primary care doctor if your symptoms are not improving over this time.  Signs of a more serious head injury include vomiting, severe headache, excessive sleepiness or confusion, and weakness or numbness in your face, arms or legs.  Return immediately to the Emergency Department if you experience any of these more concerning symptoms.    Rest, avoid strenuous physical or mental activity, and avoid activities that could potentially result in another head injury until all your symptoms from this head injury are completely resolved for at least 2-3 weeks.  If you participate in sports, get cleared by your doctor or trainer before returning to play.  You may take ibuprofen or acetaminophen over the counter according to label instructions for mild headache or scalp soreness.

## 2016-12-17 ENCOUNTER — Emergency Department (HOSPITAL_COMMUNITY)
Admission: EM | Admit: 2016-12-17 | Discharge: 2016-12-18 | Disposition: A | Discharge status: Home / Self Care | Attending: Emergency Medicine | Admitting: Emergency Medicine

## 2016-12-17 ENCOUNTER — Encounter (HOSPITAL_COMMUNITY): Payer: Self-pay

## 2016-12-17 ENCOUNTER — Emergency Department (HOSPITAL_COMMUNITY)

## 2016-12-17 DIAGNOSIS — Z79899 Other long term (current) drug therapy: Secondary | ICD-10-CM | POA: Insufficient documentation

## 2016-12-17 DIAGNOSIS — K611 Rectal abscess: Secondary | ICD-10-CM | POA: Insufficient documentation

## 2016-12-17 DIAGNOSIS — Z87891 Personal history of nicotine dependence: Secondary | ICD-10-CM | POA: Diagnosis not present

## 2016-12-17 LAB — URINALYSIS, ROUTINE W REFLEX MICROSCOPIC
Bacteria, UA: NONE SEEN
GLUCOSE, UA: NEGATIVE mg/dL
Ketones, ur: 20 mg/dL — AB
LEUKOCYTES UA: NEGATIVE
Nitrite: NEGATIVE
PH: 8 (ref 5.0–8.0)
Protein, ur: 100 mg/dL — AB
Specific Gravity, Urine: 1.03 (ref 1.005–1.030)
Squamous Epithelial / LPF: NONE SEEN

## 2016-12-17 LAB — I-STAT CHEM 8, ED
BUN: 19 mg/dL (ref 6–20)
CREATININE: 0.8 mg/dL (ref 0.61–1.24)
Calcium, Ion: 1.12 mmol/L — ABNORMAL LOW (ref 1.15–1.40)
Chloride: 97 mmol/L — ABNORMAL LOW (ref 101–111)
Glucose, Bld: 95 mg/dL (ref 65–99)
HCT: 33 % — ABNORMAL LOW (ref 39.0–52.0)
Hemoglobin: 11.2 g/dL — ABNORMAL LOW (ref 13.0–17.0)
Potassium: 3.9 mmol/L (ref 3.5–5.1)
Sodium: 137 mmol/L (ref 135–145)
TCO2: 33 mmol/L (ref 0–100)

## 2016-12-17 LAB — CBC WITH DIFFERENTIAL/PLATELET
BASOS PCT: 0 %
Basophils Absolute: 0 10*3/uL (ref 0.0–0.1)
EOS PCT: 0 %
Eosinophils Absolute: 0 10*3/uL (ref 0.0–0.7)
HEMATOCRIT: 31.2 % — AB (ref 39.0–52.0)
Hemoglobin: 9.6 g/dL — ABNORMAL LOW (ref 13.0–17.0)
LYMPHS ABS: 0.5 10*3/uL — AB (ref 0.7–4.0)
Lymphocytes Relative: 8 %
MCH: 26.3 pg (ref 26.0–34.0)
MCHC: 30.8 g/dL (ref 30.0–36.0)
MCV: 85.5 fL (ref 78.0–100.0)
Monocytes Absolute: 0.1 10*3/uL (ref 0.1–1.0)
Monocytes Relative: 1 %
NEUTROS ABS: 5.4 10*3/uL (ref 1.7–7.7)
Neutrophils Relative %: 85 %
OTHER: 6 %
Platelets: 191 10*3/uL (ref 150–400)
RBC: 3.65 MIL/uL — ABNORMAL LOW (ref 4.22–5.81)
RDW: 14.9 % (ref 11.5–15.5)
WBC: 6.3 10*3/uL (ref 4.0–10.5)

## 2016-12-17 LAB — I-STAT CG4 LACTIC ACID, ED: Lactic Acid, Venous: 1.27 mmol/L (ref 0.5–1.9)

## 2016-12-17 MED ORDER — KETOROLAC TROMETHAMINE 15 MG/ML IJ SOLN
15.0000 mg | Freq: Once | INTRAMUSCULAR | Status: AC
  Administered 2016-12-17: 15 mg via INTRAVENOUS
  Filled 2016-12-17: qty 1

## 2016-12-17 MED ORDER — SODIUM CHLORIDE 0.9 % IV BOLUS (SEPSIS)
1000.0000 mL | Freq: Once | INTRAVENOUS | Status: AC
  Administered 2016-12-17: 1000 mL via INTRAVENOUS

## 2016-12-17 MED ORDER — AMOXICILLIN-POT CLAVULANATE 875-125 MG PO TABS
1.0000 | ORAL_TABLET | Freq: Once | ORAL | Status: AC
  Administered 2016-12-17: 1 via ORAL
  Filled 2016-12-17: qty 1

## 2016-12-17 MED ORDER — ONDANSETRON HCL 4 MG/2ML IJ SOLN
4.0000 mg | Freq: Once | INTRAMUSCULAR | Status: AC
  Administered 2016-12-17: 4 mg via INTRAVENOUS
  Filled 2016-12-17: qty 2

## 2016-12-17 MED ORDER — DICYCLOMINE HCL 10 MG PO CAPS
10.0000 mg | ORAL_CAPSULE | Freq: Once | ORAL | Status: AC
  Administered 2016-12-17: 10 mg via ORAL
  Filled 2016-12-17: qty 1

## 2016-12-17 MED ORDER — IOPAMIDOL (ISOVUE-300) INJECTION 61%
INTRAVENOUS | Status: AC
  Administered 2016-12-17: 100 mL via INTRAVENOUS
  Filled 2016-12-17: qty 100

## 2016-12-17 MED ORDER — MORPHINE SULFATE (PF) 4 MG/ML IV SOLN
4.0000 mg | Freq: Once | INTRAVENOUS | Status: AC
  Administered 2016-12-17: 4 mg via INTRAVENOUS
  Filled 2016-12-17: qty 1

## 2016-12-17 NOTE — ED Provider Notes (Signed)
[facility] DEPT Provider Note   CSN: [id number] Arrival date & time: 12/17/16  1813     History   Chief Complaint Chief Complaint  Patient presents with  . Hypotension  . Rectal Problems    HPI William Pena is a 31 y.o. male.  The history is provided by the patient. No language interpreter was used.    William Pena is a 31 y.o. male who presents to the Emergency Department complaining of multiple compliants.  Mr. Cohron is currently incarcerated at the [facility] and has a history of HIV and perirectal abscesses been treated surgically at [facility]. Presents today for evaluation of nausea and vomiting after starting 4 days ago and striking his head. He also endorses persistent and worsening abdominal pain in the left lower quadrant radiating to the pelvis as well as increasing pain at its prior perirectal abscess site. He endorses subjective fevers. No diarrhea, dysuria. He does have decreased urinary output. [name] not currently on any of his medications and has been off of them for the last month.  Past Medical History:  Diagnosis Date  . HIV (human immunodeficiency virus infection) ([place])   . Perirectal abscess     There are no active problems to display for this patient.   Past Surgical History:  Procedure Laterality Date  . INCISION AND DRAINAGE PERIRECTAL ABSCESS         Home Medications    Prior to Admission medications   Medication Sig Start Date End Date Taking? Authorizing Provider  abacavir-dolutegravir-lamiVUDine (TRIUMEQ) 600-50-300 MG tablet Take 1 tablet by mouth daily.   Yes [provider]  acetaminophen (TYLENOL) 325 MG tablet Take 650 mg by mouth every 6 (six) hours as needed for moderate pain.    Yes [provider]  amoxicillin-clavulanate (AUGMENTIN) 875-125 MG tablet Take 1 tablet by mouth every 12 (twelve) hours. 12/18/16   Quintella Reichert, MD  ibuprofen (ADVIL,MOTRIN) 800 MG tablet Take 1 tablet (800 mg  total) by mouth every 8 (eight) hours as needed. Patient not taking: Reported on 12/17/2016 12/15/16   Long, Wonda Olds, MD  ondansetron (ZOFRAN ODT) 4 MG disintegrating tablet Take 1 tablet (4 mg total) by mouth every 8 (eight) hours as needed for nausea or vomiting. 12/18/16   Quintella Reichert, MD    Family History No family history on file.  Social History Social History  Substance Use Topics  . Smoking status: Former Smoker    Packs/day: 0.33    Types: Cigarettes  . Smokeless tobacco: Never Used  . Alcohol use No     Allergies   Patient has no known allergies.   Review of Systems Review of Systems  All other systems reviewed and are negative.    Physical Exam Updated Vital Signs BP 121/85 (BP Location: Right Arm)   Pulse (!) 109   Temp 98 F (36.7 C) (Oral)   Resp 18   Ht 5\' 6"  (1.676 m)   Wt 145 lb (65.8 kg)   SpO2 96%   BMI 23.40 kg/m   Physical Exam  Constitutional: [name] oriented to person, place, and time. He appears well-developed and well-nourished.  Uncomfortable appearing  HENT:  Head: Normocephalic and atraumatic.  Cardiovascular: Normal rate and regular rhythm.   No murmur heard. Pulmonary/Chest: Effort normal and breath sounds normal. No respiratory distress.  Abdominal: Soft. There is no rebound and no guarding.  Moderate diffuse abdominal tenderness without any guarding or rebound  Genitourinary:  Genitourinary Comments: There is significant  perirectal tenderness to palpation predominantly over the region of multiple healing abscesses to the right perirectal region. There is a loop drain in place with no active drainage. No significant induration.  Musculoskeletal: He exhibits no edema or tenderness.  Neurological: [name] alert and oriented to person, place, and time.  Skin: Skin is warm and dry.  Psychiatric: He has a normal mood and affect. His behavior is normal.  Nursing note and vitals reviewed.    ED Treatments / Results  Labs (all labs  ordered are listed, but only abnormal results are displayed) Labs Reviewed  COMPREHENSIVE METABOLIC PANEL - Abnormal; Notable for the following:       Result Value   Chloride 97 (*)    Total Protein 9.9 (*)    Albumin 3.2 (*)    ALT 13 (*)    All other components within normal limits  CBC WITH DIFFERENTIAL/PLATELET - Abnormal; Notable for the following:    RBC 3.65 (*)    Hemoglobin 9.6 (*)    HCT 31.2 (*)    Lymphs Abs 0.5 (*)    All other components within normal limits  URINALYSIS, ROUTINE W REFLEX MICROSCOPIC - Abnormal; Notable for the following:    Color, Urine AMBER (*)    Hgb urine dipstick SMALL (*)    Bilirubin Urine SMALL (*)    Ketones, ur 20 (*)    Protein, ur 100 (*)    All other components within normal limits  I-STAT CHEM 8, ED - Abnormal; Notable for the following:    Chloride 97 (*)    Calcium, Ion 1.12 (*)    Hemoglobin 11.2 (*)    HCT 33.0 (*)    All other components within normal limits  PATHOLOGIST SMEAR REVIEW  I-STAT CG4 LACTIC ACID, ED    EKG  EKG Interpretation None       Radiology Ct Head Wo Contrast  Result Date: 12/17/2016 CLINICAL DATA:  Headache EXAM: CT HEAD WITHOUT CONTRAST TECHNIQUE: Contiguous axial images were obtained from the base of the skull through the vertex without intravenous contrast. COMPARISON:  None. FINDINGS: Brain: No evidence of acute infarction, hemorrhage, hydrocephalus, extra-axial collection or mass lesion/mass effect. Vascular: No hyperdense vessel or unexpected calcification. Skull: Normal. Negative for fracture or focal lesion. Sinuses/Orbits: No acute finding. Other: None IMPRESSION: No acute intracranial abnormality. Electronically Signed   By: Ashley Royalty M.D.   On: 12/17/2016 21:58   Ct Abdomen Pelvis W Contrast  Result Date: 12/17/2016 CLINICAL DATA:  Known rectal abscesses. Frequent vomiting past 4 days. Hypotension earlier today. HIV positive. EXAM: CT ABDOMEN AND PELVIS WITH CONTRAST TECHNIQUE:  Multidetector CT imaging of the abdomen and pelvis was performed using the standard protocol following bolus administration of intravenous contrast. CONTRAST:  <See Chart> ISOVUE-300 IOPAMIDOL (ISOVUE-300) INJECTION 61% COMPARISON:  10/31/2016 FINDINGS: Lower chest: Lung bases are within normal. Hepatobiliary: Single punctate gallstone. Liver and biliary tree are within normal. Pancreas: Within normal. Spleen: Within normal. Adrenals/Urinary Tract: Adrenal glands are normal. Kidneys are normal in size without hydronephrosis or nephrolithiasis. Ureters and bladder are normal. Stomach/Bowel: Stomach and small bowel are within normal. Appendix is within normal. Colon is unremarkable. Vascular/Lymphatic: Vascular structures are within normal. Mildly prominent inguinal lymph nodes unchanged. Reproductive: Unremarkable. Other: Slight interval decrease in size of a medial right gluteal subcutaneous abscess measuring 1 x 3.3 cm (previously 1.5 x 3.8 cm). Small caliber drain projects very superficial lesion over the lower gluteal crease just above this subcutaneous abscess. Mild wall thickening  of the rectum with small round enhancing right perirectal abscess measuring 0.8 x 1.6 cm. Subtle perirectal inflammation. Few small perirectal lymph nodes are present. Slight worsening external iliac chain adenopathy with the largest node over the right iliac chain measuring 1.6 x 3.3 cm. Musculoskeletal: Within normal. IMPRESSION: Persistent rectal wall thickening and perirectal inflammation with new small right perirectal abscess measuring 0.8 x 1.6 cm. Interval decrease in size of a medial right gluteal subcutaneous abscess measuring 1 x 3.3 cm (previously 1.5 x 3.8 cm). Bilateral inguinal and iliac chain/ pelvic adenopathy slightly worse. Findings likely related to patient's ongoing infection described above as well as known HIV. Minimal cholelithiasis unchanged. These results were called by telephone at the time of interpretation  on 12/17/2016 at 10:13 pm to Dr. Quintella Reichert , who verbally acknowledged these results. Electronically Signed   By: Marin Olp M.D.   On: 12/17/2016 22:14    Procedures Procedures (including critical care time)  Medications Ordered in ED Medications  sodium chloride 0.9 % bolus 1,000 mL (0 mLs Intravenous Stopped 12/17/16 2201)  ondansetron (ZOFRAN) injection 4 mg (4 mg Intravenous Given 12/17/16 2008)  iopamidol (ISOVUE-300) 61 % injection (100 mLs Intravenous Contrast Given 12/17/16 2136)  dicyclomine (BENTYL) capsule 10 mg (10 mg Oral Given 12/17/16 2201)  ketorolac (TORADOL) 15 MG/ML injection 15 mg (15 mg Intravenous Given 12/17/16 2244)  amoxicillin-clavulanate (AUGMENTIN) 875-125 MG per tablet 1 tablet (1 tablet Oral Given 12/17/16 2344)  morphine 4 MG/ML injection 4 mg (4 mg Intravenous Given 12/17/16 2343)     Initial Impression / Assessment and Plan / ED Course  I have reviewed the triage vital signs and the nursing notes.  Pertinent labs & imaging results that were available during my care of the patient were reviewed by me and considered in my medical decision making (see chart for details).   patient with history of perirectal abscess and HIV here with vomiting for the last 4 days after hitting his head as well as perirectal pain that has been chronic but worsening since December. [name] nontoxic appearing on examination. He does have chronic changes to the perirectal area with some tenderness to the right perirectal region with skin thickening. There is no discrete abscess on examination. Given his tenderness CT abdomen was obtained and there is note of development of a new small abscess. Labs are at his baseline. [name] tolerating oral fluids in the emergency department without difficulty. Discussed with Dr. Donne Hazel with general surgery who recommended starting oral antibiotics with patient follow-up with his surgeon. Counseled patient on findings of studies and need to restart  antibiotics. Discussed outpatient follow-up and return precautions.  Final Clinical Impressions(s) / ED Diagnoses   Final diagnoses:  Peri-rectal abscess    New Prescriptions New Prescriptions   AMOXICILLIN-CLAVULANATE (AUGMENTIN) 875-125 MG TABLET    Take 1 tablet by mouth every 12 (twelve) hours.   ONDANSETRON (ZOFRAN ODT) 4 MG DISINTEGRATING TABLET    Take 1 tablet (4 mg total) by mouth every 8 (eight) hours as needed for nausea or vomiting.     Quintella Reichert, MD 12/18/16 [phone]

## 2016-12-17 NOTE — ED Notes (Signed)
Bed: QH60 Expected date:  Expected time:  Means of arrival:  Comments: Pt still in rm

## 2016-12-17 NOTE — ED Triage Notes (Signed)
He states he has known rectal abscesses; and that he has been vomiting frequently x 4 days. He comes with paperwork from the jail nurse which indicates he was hypotensive earlier today (80/40). [name] in no distress, but tells me he is having a lot of rectal pain.

## 2016-12-18 LAB — COMPREHENSIVE METABOLIC PANEL
ALT: 13 U/L — ABNORMAL LOW (ref 17–63)
ANION GAP: 8 (ref 5–15)
AST: 21 U/L (ref 15–41)
Albumin: 3.2 g/dL — ABNORMAL LOW (ref 3.5–5.0)
Alkaline Phosphatase: 69 U/L (ref 38–126)
BUN: 17 mg/dL (ref 6–20)
CHLORIDE: 97 mmol/L — AB (ref 101–111)
CO2: 30 mmol/L (ref 22–32)
CREATININE: 0.88 mg/dL (ref 0.61–1.24)
Calcium: 8.9 mg/dL (ref 8.9–10.3)
Glucose, Bld: 91 mg/dL (ref 65–99)
POTASSIUM: 3.7 mmol/L (ref 3.5–5.1)
Sodium: 135 mmol/L (ref 135–145)
TOTAL PROTEIN: 9.9 g/dL — AB (ref 6.5–8.1)
Total Bilirubin: 1 mg/dL (ref 0.3–1.2)

## 2016-12-18 MED ORDER — ONDANSETRON 4 MG PO TBDP: 4.0000 mg | ORAL_TABLET | Freq: Three times a day (TID) | ORAL | 0 refills | Status: DC | PRN

## 2016-12-18 MED ORDER — OXYCODONE-ACETAMINOPHEN 5-325 MG PO TABS
1.0000 | ORAL_TABLET | Freq: Once | ORAL | Status: AC
  Administered 2016-12-18: 1 via ORAL
  Filled 2016-12-18: qty 1

## 2016-12-18 MED ORDER — AMOXICILLIN-POT CLAVULANATE 875-125 MG PO TABS: 1.0000 | ORAL_TABLET | Freq: Two times a day (BID) | ORAL | 0 refills | Status: DC

## 2016-12-19 LAB — PATHOLOGIST SMEAR REVIEW

## 2016-12-20 ENCOUNTER — Emergency Department (HOSPITAL_COMMUNITY): Payer: Medicaid Other

## 2016-12-20 ENCOUNTER — Encounter (HOSPITAL_COMMUNITY): Payer: Self-pay

## 2016-12-20 ENCOUNTER — Emergency Department (HOSPITAL_COMMUNITY)
Admission: EM | Admit: 2016-12-20 | Discharge: 2016-12-20 | Disposition: A | Discharge status: Home / Self Care | Payer: Medicaid Other | Source: Home / Self Care | Attending: Emergency Medicine | Admitting: Emergency Medicine

## 2016-12-20 ENCOUNTER — Inpatient Hospital Stay (HOSPITAL_COMMUNITY)
Admission: EM | Admit: 2016-12-20 | Discharge: 2017-02-06 | DRG: 969 | Disposition: A | Discharge status: Skilled Nursing Facility | Payer: Medicaid Other | Attending: Family Medicine | Admitting: Family Medicine

## 2016-12-20 DIAGNOSIS — G9341 Metabolic encephalopathy: Secondary | ICD-10-CM | POA: Diagnosis present

## 2016-12-20 DIAGNOSIS — I749 Embolism and thrombosis of unspecified artery: Secondary | ICD-10-CM

## 2016-12-20 DIAGNOSIS — R6510 Systemic inflammatory response syndrome (SIRS) of non-infectious origin without acute organ dysfunction: Secondary | ICD-10-CM

## 2016-12-20 DIAGNOSIS — R7989 Other specified abnormal findings of blood chemistry: Secondary | ICD-10-CM | POA: Diagnosis present

## 2016-12-20 DIAGNOSIS — R291 Meningismus: Secondary | ICD-10-CM

## 2016-12-20 DIAGNOSIS — R0602 Shortness of breath: Secondary | ICD-10-CM

## 2016-12-20 DIAGNOSIS — T360X5A Adverse effect of penicillins, initial encounter: Secondary | ICD-10-CM | POA: Diagnosis not present

## 2016-12-20 DIAGNOSIS — Z978 Presence of other specified devices: Secondary | ICD-10-CM

## 2016-12-20 DIAGNOSIS — J984 Other disorders of lung: Secondary | ICD-10-CM

## 2016-12-20 DIAGNOSIS — Z9114 Patient's other noncompliance with medication regimen: Secondary | ICD-10-CM

## 2016-12-20 DIAGNOSIS — R402143 Coma scale, eyes open, spontaneous, at hospital admission: Secondary | ICD-10-CM | POA: Diagnosis present

## 2016-12-20 DIAGNOSIS — L270 Generalized skin eruption due to drugs and medicaments taken internally: Secondary | ICD-10-CM | POA: Diagnosis not present

## 2016-12-20 DIAGNOSIS — A419 Sepsis, unspecified organism: Secondary | ICD-10-CM | POA: Diagnosis not present

## 2016-12-20 DIAGNOSIS — Z4659 Encounter for fitting and adjustment of other gastrointestinal appliance and device: Secondary | ICD-10-CM

## 2016-12-20 DIAGNOSIS — Z91018 Allergy to other foods: Secondary | ICD-10-CM

## 2016-12-20 DIAGNOSIS — K611 Rectal abscess: Secondary | ICD-10-CM

## 2016-12-20 DIAGNOSIS — Z419 Encounter for procedure for purposes other than remedying health state, unspecified: Secondary | ICD-10-CM

## 2016-12-20 DIAGNOSIS — D62 Acute posthemorrhagic anemia: Secondary | ICD-10-CM | POA: Diagnosis not present

## 2016-12-20 DIAGNOSIS — Z79899 Other long term (current) drug therapy: Secondary | ICD-10-CM | POA: Insufficient documentation

## 2016-12-20 DIAGNOSIS — Z6822 Body mass index (BMI) 22.0-22.9, adult: Secondary | ICD-10-CM

## 2016-12-20 DIAGNOSIS — E876 Hypokalemia: Secondary | ICD-10-CM | POA: Diagnosis not present

## 2016-12-20 DIAGNOSIS — G039 Meningitis, unspecified: Secondary | ICD-10-CM

## 2016-12-20 DIAGNOSIS — K612 Anorectal abscess: Secondary | ICD-10-CM | POA: Diagnosis present

## 2016-12-20 DIAGNOSIS — B20 Human immunodeficiency virus [HIV] disease: Principal | ICD-10-CM | POA: Diagnosis present

## 2016-12-20 DIAGNOSIS — G9340 Encephalopathy, unspecified: Secondary | ICD-10-CM

## 2016-12-20 DIAGNOSIS — Z87891 Personal history of nicotine dependence: Secondary | ICD-10-CM

## 2016-12-20 DIAGNOSIS — J9600 Acute respiratory failure, unspecified whether with hypoxia or hypercapnia: Secondary | ICD-10-CM

## 2016-12-20 DIAGNOSIS — I6340 Cerebral infarction due to embolism of unspecified cerebral artery: Secondary | ICD-10-CM | POA: Diagnosis not present

## 2016-12-20 DIAGNOSIS — D638 Anemia in other chronic diseases classified elsewhere: Secondary | ICD-10-CM | POA: Diagnosis present

## 2016-12-20 DIAGNOSIS — R402363 Coma scale, best motor response, obeys commands, at hospital admission: Secondary | ICD-10-CM | POA: Diagnosis present

## 2016-12-20 DIAGNOSIS — J9601 Acute respiratory failure with hypoxia: Secondary | ICD-10-CM | POA: Diagnosis not present

## 2016-12-20 DIAGNOSIS — R339 Retention of urine, unspecified: Secondary | ICD-10-CM | POA: Diagnosis not present

## 2016-12-20 DIAGNOSIS — G932 Benign intracranial hypertension: Secondary | ICD-10-CM

## 2016-12-20 DIAGNOSIS — R591 Generalized enlarged lymph nodes: Secondary | ICD-10-CM

## 2016-12-20 DIAGNOSIS — E222 Syndrome of inappropriate secretion of antidiuretic hormone: Secondary | ICD-10-CM | POA: Diagnosis present

## 2016-12-20 DIAGNOSIS — I10 Essential (primary) hypertension: Secondary | ICD-10-CM | POA: Diagnosis present

## 2016-12-20 DIAGNOSIS — R918 Other nonspecific abnormal finding of lung field: Secondary | ICD-10-CM

## 2016-12-20 DIAGNOSIS — G8929 Other chronic pain: Secondary | ICD-10-CM | POA: Diagnosis present

## 2016-12-20 DIAGNOSIS — Z881 Allergy status to other antibiotic agents status: Secondary | ICD-10-CM

## 2016-12-20 DIAGNOSIS — R4182 Altered mental status, unspecified: Secondary | ICD-10-CM

## 2016-12-20 DIAGNOSIS — R402253 Coma scale, best verbal response, oriented, at hospital admission: Secondary | ICD-10-CM | POA: Diagnosis present

## 2016-12-20 DIAGNOSIS — E43 Unspecified severe protein-calorie malnutrition: Secondary | ICD-10-CM | POA: Diagnosis present

## 2016-12-20 DIAGNOSIS — N179 Acute kidney failure, unspecified: Secondary | ICD-10-CM | POA: Diagnosis not present

## 2016-12-20 DIAGNOSIS — B457 Disseminated cryptococcosis: Secondary | ICD-10-CM | POA: Diagnosis present

## 2016-12-20 DIAGNOSIS — K6289 Other specified diseases of anus and rectum: Secondary | ICD-10-CM

## 2016-12-20 DIAGNOSIS — R4020 Unspecified coma: Secondary | ICD-10-CM

## 2016-12-20 DIAGNOSIS — J9690 Respiratory failure, unspecified, unspecified whether with hypoxia or hypercapnia: Secondary | ICD-10-CM

## 2016-12-20 DIAGNOSIS — B370 Candidal stomatitis: Secondary | ICD-10-CM | POA: Diagnosis not present

## 2016-12-20 DIAGNOSIS — J189 Pneumonia, unspecified organism: Secondary | ICD-10-CM

## 2016-12-20 DIAGNOSIS — B450 Pulmonary cryptococcosis: Secondary | ICD-10-CM | POA: Diagnosis present

## 2016-12-20 DIAGNOSIS — R945 Abnormal results of liver function studies: Secondary | ICD-10-CM

## 2016-12-20 DIAGNOSIS — G0490 Encephalitis and encephalomyelitis, unspecified: Secondary | ICD-10-CM | POA: Diagnosis present

## 2016-12-20 DIAGNOSIS — L8990 Pressure ulcer of unspecified site, unspecified stage: Secondary | ICD-10-CM | POA: Insufficient documentation

## 2016-12-20 DIAGNOSIS — K639 Disease of intestine, unspecified: Secondary | ICD-10-CM

## 2016-12-20 DIAGNOSIS — J168 Pneumonia due to other specified infectious organisms: Secondary | ICD-10-CM | POA: Diagnosis present

## 2016-12-20 DIAGNOSIS — B451 Cerebral cryptococcosis: Secondary | ICD-10-CM | POA: Diagnosis present

## 2016-12-20 DIAGNOSIS — R509 Fever, unspecified: Secondary | ICD-10-CM

## 2016-12-20 DIAGNOSIS — L89152 Pressure ulcer of sacral region, stage 2: Secondary | ICD-10-CM | POA: Diagnosis not present

## 2016-12-20 DIAGNOSIS — E871 Hypo-osmolality and hyponatremia: Secondary | ICD-10-CM | POA: Diagnosis present

## 2016-12-20 DIAGNOSIS — D649 Anemia, unspecified: Secondary | ICD-10-CM | POA: Diagnosis present

## 2016-12-20 DIAGNOSIS — K560 Paralytic ileus: Secondary | ICD-10-CM

## 2016-12-20 LAB — CBC WITH DIFFERENTIAL/PLATELET
BASOS PCT: 1 %
Basophils Absolute: 0.1 10*3/uL (ref 0.0–0.1)
EOS PCT: 1 %
Eosinophils Absolute: 0.1 10*3/uL (ref 0.0–0.7)
HCT: 31.1 % — ABNORMAL LOW (ref 39.0–52.0)
HEMOGLOBIN: 9.9 g/dL — AB (ref 13.0–17.0)
Lymphocytes Relative: 8 %
Lymphs Abs: 0.6 10*3/uL — ABNORMAL LOW (ref 0.7–4.0)
MCH: 26.5 pg (ref 26.0–34.0)
MCHC: 31.8 g/dL (ref 30.0–36.0)
MCV: 83.4 fL (ref 78.0–100.0)
MONO ABS: 0.2 10*3/uL (ref 0.1–1.0)
Monocytes Relative: 3 %
NEUTROS ABS: 5.9 10*3/uL (ref 1.7–7.7)
Neutrophils Relative %: 87 %
PLATELETS: 131 10*3/uL — AB (ref 150–400)
RBC: 3.73 MIL/uL — ABNORMAL LOW (ref 4.22–5.81)
RDW: 14.8 % (ref 11.5–15.5)
WBC: 6.9 10*3/uL (ref 4.0–10.5)

## 2016-12-20 LAB — COMPREHENSIVE METABOLIC PANEL
ALBUMIN: 2.6 g/dL — AB (ref 3.5–5.0)
ALK PHOS: 75 U/L (ref 38–126)
ALT: 38 U/L (ref 17–63)
ALT: 62 U/L (ref 17–63)
ANION GAP: 9 (ref 5–15)
AST: 62 U/L — ABNORMAL HIGH (ref 15–41)
AST: 139 U/L — AB (ref 15–41)
Albumin: 2.9 g/dL — ABNORMAL LOW (ref 3.5–5.0)
Alkaline Phosphatase: 76 U/L (ref 38–126)
Anion gap: 5 (ref 5–15)
BUN: 16 mg/dL (ref 6–20)
BUN: 13 mg/dL (ref 6–20)
CALCIUM: 8.2 mg/dL — AB (ref 8.9–10.3)
CO2: 25 mmol/L (ref 22–32)
CO2: 24 mmol/L (ref 22–32)
CREATININE: 0.93 mg/dL (ref 0.61–1.24)
Calcium: 7.5 mg/dL — ABNORMAL LOW (ref 8.9–10.3)
Chloride: 94 mmol/L — ABNORMAL LOW (ref 101–111)
Chloride: 98 mmol/L — ABNORMAL LOW (ref 101–111)
Creatinine, Ser: 1.02 mg/dL (ref 0.61–1.24)
GFR calc Af Amer: >60 mL/min (ref >60)
GFR calc non Af Amer: >60 mL/min (ref >60)
GLUCOSE: 89 mg/dL (ref 65–99)
Glucose, Bld: 93 mg/dL (ref 65–99)
Potassium: 3.8 mmol/L (ref 3.5–5.1)
Potassium: 5.4 mmol/L — ABNORMAL HIGH (ref 3.5–5.1)
SODIUM: 127 mmol/L — AB (ref 135–145)
Sodium: 128 mmol/L — ABNORMAL LOW (ref 135–145)
Total Bilirubin: 0.6 mg/dL (ref 0.3–1.2)
Total Bilirubin: 1.3 mg/dL — ABNORMAL HIGH (ref 0.3–1.2)
Total Protein: 9.2 g/dL — ABNORMAL HIGH (ref 6.5–8.1)
Total Protein: 8.4 g/dL — ABNORMAL HIGH (ref 6.5–8.1)

## 2016-12-20 LAB — CBC
HCT: 28.9 % — ABNORMAL LOW (ref 39.0–52.0)
HEMOGLOBIN: 9.1 g/dL — AB (ref 13.0–17.0)
MCH: 26.1 pg (ref 26.0–34.0)
MCHC: 31.5 g/dL (ref 30.0–36.0)
MCV: 83 fL (ref 78.0–100.0)
Platelets: 110 10*3/uL — ABNORMAL LOW (ref 150–400)
RBC: 3.48 MIL/uL — ABNORMAL LOW (ref 4.22–5.81)
RDW: 14.8 % (ref 11.5–15.5)
WBC: 6.8 10*3/uL (ref 4.0–10.5)

## 2016-12-20 LAB — URINALYSIS, ROUTINE W REFLEX MICROSCOPIC
Bacteria, UA: NONE SEEN
Bilirubin Urine: NEGATIVE
GLUCOSE, UA: NEGATIVE mg/dL
Hgb urine dipstick: NEGATIVE
Ketones, ur: NEGATIVE mg/dL
Leukocytes, UA: NEGATIVE
Nitrite: NEGATIVE
PROTEIN: 100 mg/dL — AB
SPECIFIC GRAVITY, URINE: 1.026 (ref 1.005–1.030)
pH: 6 (ref 5.0–8.0)

## 2016-12-20 LAB — I-STAT CG4 LACTIC ACID, ED
LACTIC ACID, VENOUS: 1.02 mmol/L (ref 0.5–1.9)
LACTIC ACID, VENOUS: 1.14 mmol/L (ref 0.5–1.9)
LACTIC ACID, VENOUS: 2.33 mmol/L — AB (ref 0.5–1.9)

## 2016-12-20 MED ORDER — SODIUM CHLORIDE 0.9 % IV SOLN
INTRAVENOUS | Status: DC
Start: 2016-12-20 — End: 2016-12-20
  Administered 2016-12-20 (×2): via INTRAVENOUS

## 2016-12-20 MED ORDER — METRONIDAZOLE IN NACL 5-0.79 MG/ML-% IV SOLN
500.0000 mg | Freq: Once | INTRAVENOUS | Status: AC
  Administered 2016-12-20: 500 mg via INTRAVENOUS
  Filled 2016-12-20: qty 100

## 2016-12-20 MED ORDER — DIPHENHYDRAMINE HCL 25 MG PO CAPS
25.0000 mg | ORAL_CAPSULE | Freq: Once | ORAL | Status: AC
  Administered 2016-12-20: 25 mg via ORAL
  Filled 2016-12-20: qty 1

## 2016-12-20 MED ORDER — HYDROMORPHONE HCL 1 MG/ML IJ SOLN
1.0000 mg | Freq: Once | INTRAMUSCULAR | Status: AC
  Administered 2016-12-20: 1 mg via INTRAVENOUS
  Filled 2016-12-20: qty 1

## 2016-12-20 MED ORDER — MORPHINE SULFATE (PF) 4 MG/ML IV SOLN
INTRAVENOUS | Status: AC
  Filled 2016-12-20: qty 1

## 2016-12-20 MED ORDER — METOCLOPRAMIDE HCL 5 MG/ML IJ SOLN
10.0000 mg | Freq: Once | INTRAMUSCULAR | Status: AC
  Administered 2016-12-20: 10 mg via INTRAVENOUS
  Filled 2016-12-20: qty 2

## 2016-12-20 MED ORDER — MORPHINE SULFATE (PF) 4 MG/ML IV SOLN
4.0000 mg | Freq: Once | INTRAVENOUS | Status: AC
  Administered 2016-12-20: 4 mg via INTRAVENOUS
  Filled 2016-12-20: qty 1

## 2016-12-20 MED ORDER — IOPAMIDOL (ISOVUE-300) INJECTION 61%
INTRAVENOUS | Status: AC
  Administered 2016-12-20: 100 mL
  Filled 2016-12-20: qty 30

## 2016-12-20 MED ORDER — METRONIDAZOLE 500 MG PO TABS: 500.0000 mg | ORAL_TABLET | Freq: Two times a day (BID) | ORAL | 0 refills | Status: DC

## 2016-12-20 MED ORDER — SODIUM CHLORIDE 0.9 % IV BOLUS (SEPSIS)
1000.0000 mL | Freq: Once | INTRAVENOUS | Status: AC
  Administered 2016-12-20: 1000 mL via INTRAVENOUS

## 2016-12-20 MED ORDER — ACETAMINOPHEN 500 MG PO TABS
1000.0000 mg | ORAL_TABLET | Freq: Once | ORAL | Status: AC
  Administered 2016-12-20: 1000 mg via ORAL
  Filled 2016-12-20: qty 2

## 2016-12-20 MED ORDER — CLINDAMYCIN HCL 150 MG PO CAPS: 150.0000 mg | ORAL_CAPSULE | Freq: Four times a day (QID) | ORAL | 0 refills | Status: DC

## 2016-12-20 MED ORDER — OXYCODONE HCL 5 MG PO TABS
5.0000 mg | ORAL_TABLET | Freq: Once | ORAL | Status: DC
  Filled 2016-12-20: qty 1

## 2016-12-20 MED ORDER — IOPAMIDOL (ISOVUE-300) INJECTION 61%
INTRAVENOUS | Status: AC
  Filled 2016-12-20: qty 100

## 2016-12-20 MED ORDER — SODIUM CHLORIDE 0.9 % IV BOLUS (SEPSIS)
2000.0000 mL | Freq: Once | INTRAVENOUS | Status: AC
  Administered 2016-12-20: 2000 mL via INTRAVENOUS

## 2016-12-20 MED ORDER — CLINDAMYCIN PHOSPHATE 600 MG/50ML IV SOLN
600.0000 mg | Freq: Once | INTRAVENOUS | Status: AC
  Administered 2016-12-20: 600 mg via INTRAVENOUS
  Filled 2016-12-20: qty 50

## 2016-12-20 MED ORDER — DIPHENHYDRAMINE HCL 50 MG/ML IJ SOLN
12.5000 mg | Freq: Once | INTRAMUSCULAR | Status: AC
  Administered 2016-12-20: 12.5 mg via INTRAVENOUS
  Filled 2016-12-20: qty 1

## 2016-12-20 NOTE — ED Notes (Signed)
Bed: CA98 Expected date:  Expected time:  Means of arrival:  Comments: N, v from jail

## 2016-12-20 NOTE — ED Notes (Signed)
Pt refused oral pain medication. Requests he only be given IV narcotics for pain.  States he "can't take by mouth medications, only IV".

## 2016-12-20 NOTE — ED Notes (Signed)
PT UNDER SHERIFF CUSTODY

## 2016-12-20 NOTE — ED Triage Notes (Addendum)
Pt brought in by [facility]. Pt c/o rectal bleeding and generalized body aches x1 week. Pt states he has chest pain, shortness of breath, light headed. Pt was seen at [facility] this morning for same.  Denies cough. AOx4.

## 2016-12-20 NOTE — ED Notes (Signed)
ED Provider at bedside. Aware of rectal bleeding and request for pain medication.

## 2016-12-20 NOTE — ED Provider Notes (Signed)
[facility] DEPT Provider Note   CSN: [id number] Arrival date & time: 12/20/16  1815     History   Chief Complaint Chief Complaint  Patient presents with  . Rectal Bleeding  . Generalized Body Aches    HPI William Pena is a 31 y.o. male.  The history is provided by the patient.  Illness  This is a chronic problem. The current episode started more than 1 week ago. The problem occurs constantly. The problem has been gradually improving. Pertinent negatives include no chest pain, no abdominal pain, no headaches and no shortness of breath. Associated symptoms comments: Patient with hx of rectal abscesses for the last few months. Had a drain placed 1 month ago by a Psychologist, sport and exercise at [employer]. Patient unable to follow up because currently incarcerated. Has multiple visits to ED this past week. Patient with continued fevers and pain. . Nothing aggravates the symptoms. The symptoms are relieved by acetaminophen. Treatments tried: multiple round of antibiotics and currently on clindamycin and flagyl.    Past Medical History:  Diagnosis Date  . HIV (human immunodeficiency virus infection) ([place])   . Perirectal abscess     Patient Active Problem List   Diagnosis Date Noted  . Perirectal abscess 12/21/2016    Past Surgical History:  Procedure Laterality Date  . INCISION AND DRAINAGE PERIRECTAL ABSCESS         Home Medications    Prior to Admission medications   Medication Sig Start Date End Date Taking? Authorizing Provider  acetaminophen (TYLENOL) 325 MG tablet Take 975 mg by mouth 3 (three) times daily as needed for moderate pain.     [provider]  amoxicillin-clavulanate (AUGMENTIN) 875-125 MG tablet Take 1 tablet by mouth every 12 (twelve) hours. Patient not taking: Reported on 12/20/2016 12/18/16   Quintella Reichert, MD  clindamycin (CLEOCIN) 150 MG capsule Take 1 capsule (150 mg total) by mouth every 6 (six) hours. 12/20/16   Lacretia Leigh, MD  docusate sodium (COLACE)  100 MG capsule Take 100 mg by mouth 2 (two) times daily.    [provider]  ibuprofen (ADVIL,MOTRIN) 800 MG tablet Take 1 tablet (800 mg total) by mouth every 8 (eight) hours as needed. Patient not taking: Reported on 12/17/2016 12/15/16   Long, Wonda Olds, MD  metroNIDAZOLE (FLAGYL) 500 MG tablet Take 1 tablet (500 mg total) by mouth 2 (two) times daily. 12/20/16   Lacretia Leigh, MD  ondansetron (ZOFRAN ODT) 4 MG disintegrating tablet Take 1 tablet (4 mg total) by mouth every 8 (eight) hours as needed for nausea or vomiting. 12/18/16   Quintella Reichert, MD  sulfamethoxazole-trimethoprim (BACTRIM DS,SEPTRA DS) 800-160 MG tablet Take 1 tablet by mouth daily.    [provider]    Family History History reviewed. No pertinent family history.  Social History Social History  Substance Use Topics  . Smoking status: Former Smoker    Packs/day: 0.33    Types: Cigarettes  . Smokeless tobacco: Never Used  . Alcohol use No     Allergies   Augmentin [amoxicillin-pot clavulanate]   Review of Systems Review of Systems  Constitutional: Negative for chills and fever.  HENT: Negative for ear pain and sore throat.   Eyes: Negative for pain and visual disturbance.  Respiratory: Negative for cough and shortness of breath.   Cardiovascular: Negative for chest pain and palpitations.  Gastrointestinal: Negative for abdominal pain and vomiting.  Genitourinary: Negative for dysuria and hematuria.  Musculoskeletal: Negative for arthralgias and back pain.  Skin:  Positive for wound. Negative for color change and rash.  Neurological: Negative for seizures, syncope and headaches.  All other systems reviewed and are negative.    Physical Exam Updated Vital Signs  ED Triage Vitals  Enc Vitals Group     BP 12/20/16 1830 (!) 123/91     Pulse Rate 12/20/16 1830 78     Resp 12/20/16 1830 12     Temp 12/20/16 1819 (!) 103.2 F (39.6 C)     Temp Source 12/20/16 1819 Oral     SpO2  12/20/16 1830 97 %     Weight 12/20/16 1819 140 lb (63.5 kg)     Height 12/20/16 1819 5\' 6"  (1.676 m)     Head Circumference --      Peak Flow --      Pain Score 12/20/16 1818 10     Pain Loc --      Pain Edu? --      Excl. in [place]? --     Physical Exam  Constitutional: [name] oriented to person, place, and time. He appears well-developed. He appears distressed.  HENT:  Head: Normocephalic and atraumatic.  Eyes: Conjunctivae are normal. Pupils are equal, round, and reactive to light.  Neck: Normal range of motion. Neck supple.  Cardiovascular: Normal rate, regular rhythm, normal heart sounds and intact distal pulses.   No murmur heard. Pulmonary/Chest: Effort normal and breath sounds normal. No respiratory distress. He has no wheezes. He has no rales.  Abdominal: Soft. Bowel sounds are normal. He exhibits no distension. There is no tenderness.  Genitourinary:  Genitourinary Comments: Rectal drain in place, no purulent drainage, no obvious fluctuance, multiple I and D sites  Musculoskeletal: Normal range of motion. He exhibits no edema.  Neurological: [name] alert and oriented to person, place, and time.  Skin: Skin is warm and dry.  Psychiatric: He has a normal mood and affect.  Nursing note and vitals reviewed.    ED Treatments / Results  Labs (all labs ordered are listed, but only abnormal results are displayed) Labs Reviewed  COMPREHENSIVE METABOLIC PANEL - Abnormal; Notable for the following:       Result Value   Sodium 127 (*)    Potassium 5.4 (*)    Chloride 98 (*)    Calcium 7.5 (*)    Total Protein 8.4 (*)    Albumin 2.6 (*)    AST 139 (*)    Total Bilirubin 1.3 (*)    All other components within normal limits  CBC - Abnormal; Notable for the following:    RBC 3.48 (*)    Hemoglobin 9.1 (*)    HCT 28.9 (*)    Platelets 110 (*)    All other components within normal limits  I-STAT CG4 LACTIC ACID, ED - Abnormal; Notable for the following:    Lactic Acid, Venous  2.33 (*)    All other components within normal limits  I-STAT CG4 LACTIC ACID, ED    EKG  EKG Interpretation  Date/Time:  Tuesday Dec 20 2016 18:21:04 EDT Ventricular Rate:  80 PR Interval:    QRS Duration: 92 QT Interval:  348 QTC Calculation: 402 R Axis:   89 Text Interpretation:  Sinus rhythm Borderline short PR interval RSR' in V1 or V2, right VCD or RVH No acute changes No old tracing to compare Confirmed by Varney Biles [id number]) on 12/20/2016 6:53:22 PM       Radiology Dg Chest 2 View  Result Date: 12/20/2016 CLINICAL  DATA:  Abdominal pain with nausea and vomiting and shortness of breath EXAM: CHEST  2 VIEW COMPARISON:  None. FINDINGS: The heart size and mediastinal contours are within normal limits. Both lungs are clear. The visualized skeletal structures are unremarkable. IMPRESSION: No active cardiopulmonary disease. Electronically Signed   By: Inez Catalina M.D.   On: 12/20/2016 07:48    Procedures Procedures (including critical care time)  Medications Ordered in ED Medications  iopamidol (ISOVUE-300) 61 % injection (not administered)  piperacillin-tazobactam (ZOSYN) IVPB 3.375 g (not administered)  simethicone (MYLICON) chewable tablet 40 mg (not administered)  acetaminophen (TYLENOL) tablet 1,000 mg (1,000 mg Oral Given 12/20/16 1855)  HYDROmorphone (DILAUDID) injection 1 mg (1 mg Intravenous Given 12/20/16 1944)  sodium chloride 0.9 % bolus 1,000 mL (0 mLs Intravenous Stopped 12/20/16 2200)  diphenhydrAMINE (BENADRYL) capsule 25 mg (25 mg Oral Given 12/20/16 2226)  HYDROmorphone (DILAUDID) injection 1 mg (1 mg Intravenous Given 12/20/16 2225)  iopamidol (ISOVUE-300) 61 % injection (100 mLs  Contrast Given 12/20/16 2334)     Initial Impression / Assessment and Plan / ED Course  I have reviewed the triage vital signs and the nursing notes.  Pertinent labs & imaging results that were available during my care of the patient were reviewed by me and considered in my  medical decision making (see chart for details).     William Pena is a 31 year old male with history of HIV who presents with fever. Patient's vitals at time of arrival to the ED are significant for fever but otherwise unremarkable. Patient is currently incarcerated and here with prison staff. Patient with multiple visits to the ED over the last several days including this morning for fever and concern for worsening infection from rectal abscesses. Patient had rectal abscess drained at [facility] 1 month ago and has now been incarcerated. Patient was seen at [facility] emergency department today and had unremarkable sepsis workup. Patient had normal lactic acid, no leukocytosis, negative urine studies, negative chest x-ray. Patient was given IV clindamycin and flagyl and discharged to home with prescription. Patient returns due to increased fever to 103 and tachycardia at prison. Concern for worsening infection. Exam is overall unremarkable. Rectal abscesses have no purulent drainage, no obvious new fluctuance. However, patient is uncomfortable. There is a drain in place with no active drainage. General Surgery was consulted as patient has had ED visits and continues to have fever despite antibiotics. Concerned that the infection is not controlled. Neurosurgery recommends repeating CT abdomen and pelvis with IV and rectal contrast. CBC, CMP, lactic acid sent for. Patient given normal saline bolus and IV Dilaudid for pain and tylenol. Hx of HIV, not currently treated, last CD4 count <1 and HIV quant 50,000 but no new recent lab work.  Patient with no leukocytosis. Lactic acidosis likely an error as repeat was normal. Patient otherwise with no significant electrolyte abnormalities or anemia. CT scan showed possible pneumonia, proctitis, rectal wall thickening, likely remaining abscess. General surgery likely to take patient for surgery in the morning as patient likely needs re-exploration. Patient to be admitted  to medicine service for antibiotics and further treatment. Patient to be started on vanc/zosyn/azithro and multiple sources of infection and empiric rx of GC/Cl. CD4, HIV quant, blood cultures collected.   Final Clinical Impressions(s) / ED Diagnoses   Final diagnoses:  Rectal abscess  Fever, unspecified fever cause  Community acquired pneumonia, unspecified laterality    New Prescriptions New Prescriptions   No medications on file  Lennice Sites, DO 12/27/16 Shively, Ankit, MD 12/27/16 2152

## 2016-12-20 NOTE — ED Triage Notes (Signed)
Pt comes to ed, from jail, pt c/o is abdominal pain x5 days with nausea and vomiting. Pt was here 2 days ago. Pt  Has rectal abscess and dark urine. Pt hx of HIV and vomiting blood.  Pt in police custody.  657ml, Tylenol.

## 2016-12-20 NOTE — ED Provider Notes (Signed)
[facility] DEPT Provider Note   CSN: [id number] Arrival date & time: 12/20/16  0531     History   Chief Complaint Chief Complaint  Patient presents with  . Abdominal Pain    generalized   . Emesis    x5   . Generalized Body Aches  . Fever    HPI William Pena is a 31 y.o. male.  31 year old male with history of HIV and perirectal abscess presents with one week of worsening abdominal discomfort as well as nausea and vomiting with some associated mild headache. He denies any photophobia. No mental status changes. Mild neck discomfort appreciated which is from his prior fall. Seen in the ED twice for perirectal pain and had abdominal CT 2 days ago which showed a new forming abscess. Gen. surgery was consulted at that time and patient was recommended to take oral antibiotics. He states compliance with this and developed a temperature today. [name] been short of breath but denies any cough. Does endorse some urinary symptoms. Denies any increased rectal drainage. [name] currently incarcerated and was brought here by general officials      Past Medical History:  Diagnosis Date  . HIV (human immunodeficiency virus infection) ([place])   . Perirectal abscess     There are no active problems to display for this patient.   Past Surgical History:  Procedure Laterality Date  . INCISION AND DRAINAGE PERIRECTAL ABSCESS         Home Medications    Prior to Admission medications   Medication Sig Start Date End Date Taking? Authorizing Provider  abacavir-dolutegravir-lamiVUDine (TRIUMEQ) 600-50-300 MG tablet Take 1 tablet by mouth daily.    [provider]  acetaminophen (TYLENOL) 325 MG tablet Take 650 mg by mouth every 6 (six) hours as needed for moderate pain.     [provider]  amoxicillin-clavulanate (AUGMENTIN) 875-125 MG tablet Take 1 tablet by mouth every 12 (twelve) hours. 12/18/16   Quintella Reichert, MD  ibuprofen (ADVIL,MOTRIN) 800 MG tablet Take 1  tablet (800 mg total) by mouth every 8 (eight) hours as needed. Patient not taking: Reported on 12/17/2016 12/15/16   Long, Wonda Olds, MD  ondansetron (ZOFRAN ODT) 4 MG disintegrating tablet Take 1 tablet (4 mg total) by mouth every 8 (eight) hours as needed for nausea or vomiting. 12/18/16   Quintella Reichert, MD    Family History No family history on file.  Social History Social History  Substance Use Topics  . Smoking status: Former Smoker    Packs/day: 0.33    Types: Cigarettes  . Smokeless tobacco: Never Used  . Alcohol use No     Allergies   Patient has no known allergies.   Review of Systems Review of Systems  All other systems reviewed and are negative.    Physical Exam Updated Vital Signs BP (!) 121/97   Pulse 78   Temp (!) 100.4 F (38 C) (Oral)   Resp 18   Ht 1.676 m (5\' 6" )   Wt 65.8 kg (145 lb)   SpO2 100%   BMI 23.40 kg/m   Physical Exam  Constitutional: [name] oriented to person, place, and time. He appears well-developed and well-nourished.  Non-toxic appearance. No distress.  HENT:  Head: Normocephalic and atraumatic.  Eyes: Conjunctivae, EOM and lids are normal. Pupils are equal, round, and reactive to light.  Neck: Normal range of motion. Neck supple. No tracheal deviation present. No thyroid mass present.  Cardiovascular: Regular rhythm and normal heart sounds.  Tachycardia present.  Exam reveals no gallop.   No murmur heard. Pulmonary/Chest: Effort normal and breath sounds normal. No stridor. No respiratory distress. He has no decreased breath sounds. He has no wheezes. He has no rhonchi. He has no rales.  Abdominal: Soft. Normal appearance and bowel sounds are normal. He exhibits no distension. There is no tenderness. There is no rebound and no CVA tenderness.  Musculoskeletal: Normal range of motion. He exhibits no edema or tenderness.  Neurological: [name] alert and oriented to person, place, and time. He has normal strength. No cranial nerve  deficit or sensory deficit. GCS eye subscore is 4. GCS verbal subscore is 5. GCS motor subscore is 6.  Skin: Skin is warm and dry. No abrasion and no rash noted.  Psychiatric: He has a normal mood and affect. His speech is normal and behavior is normal.  Nursing note and vitals reviewed.    ED Treatments / Results  Labs (all labs ordered are listed, but only abnormal results are displayed) Labs Reviewed  CBC WITH DIFFERENTIAL/PLATELET - Abnormal; Notable for the following:       Result Value   RBC 3.73 (*)    Hemoglobin 9.9 (*)    HCT 31.1 (*)    Platelets 131 (*)    All other components within normal limits  CULTURE, BLOOD (ROUTINE X 2)  CULTURE, BLOOD (ROUTINE X 2)  COMPREHENSIVE METABOLIC PANEL  URINALYSIS, ROUTINE W REFLEX MICROSCOPIC  I-STAT CG4 LACTIC ACID, ED    EKG  EKG Interpretation None       Radiology No results found.  Procedures Procedures (including critical care time)  Medications Ordered in ED Medications  0.9 %  sodium chloride infusion ( Intravenous New Bag/Given 12/20/16 0706)  sodium chloride 0.9 % bolus 2,000 mL (not administered)  metoCLOPramide (REGLAN) injection 10 mg (not administered)  morphine 4 MG/ML injection 4 mg (not administered)  diphenhydrAMINE (BENADRYL) injection 12.5 mg (not administered)     Initial Impression / Assessment and Plan / ED Course  I have reviewed the triage vital signs and the nursing notes.  Pertinent labs & imaging results that were available during my care of the patient were reviewed by me and considered in my medical decision making (see chart for details).     Patient had not been compliant with his Augmentin due to a prior allergic reaction. I spoke with the jail staff and have informed him that I will place the patient on clindamycin as well as Flagyl. Patient was febrile here and that was treated and he was also given Flagyl and clindamycin IV piggyback. He has no leukocytosis at this time. Patient's  anal and gluteal folds were examined and no signs of purulent drainage. Drain is intact. He was medicated for pain during the stay here and is taking by mouth fluids and food well. His urinalysis shows no signs of dehydration. His lactate is normal. Patient to receive prescriptions for Flagyl and clindamycin. No clear indication for admission at this time.  Final Clinical Impressions(s) / ED Diagnoses   Final diagnoses:  SOB (shortness of breath)    New Prescriptions New Prescriptions   No medications on file     Lacretia Leigh, MD 12/20/16 1313

## 2016-12-20 NOTE — ED Notes (Signed)
Pt ambulated to restroom. 

## 2016-12-20 NOTE — ED Notes (Signed)
Pt states blood in stool. Bright red in stool  Reported by pt ans sheriff

## 2016-12-20 NOTE — H&P (Addendum)
William Pena is an 31 y.o. male.   Chief Complaint: Perirectal abscess HPI:  Pt is a 31 yo M with history of recurrent perirectal abscesses.  He was treated at [facility] with surgery in January by Dr. Ronita Hipps, but now is incarcerated in [place].  He has had multiple different types of drains, but currently just has a draining seton in place at the site of a fistula.  He has been to the ED 3 times in the last week.  Due to his current incarceration in [facility], he cannot be transferred to [place].  He reports severe rectal pain.  He has had fever to 103 and had sepsis workup this AM at [facility] long.  He did have a CT on 5/19 that showed small abscess and antibiotics were recommended.  He was not able to be compliant with that for unclear reasons.  He has also had abdominal pain.  [name] also [facility] for his AIDS by Dr. Tobie Poet.  [name] frequently homeless as well.  [name] reportedly on his HIV meds.  Last CD 4 count looks like 1 /microliter (10/04/2016) and last viral load looks like 50,936/mL (08/08/2016).  Past Medical History:  Diagnosis Date  . HIV (human immunodeficiency virus infection) ([place])   . Perirectal abscess     Past Surgical History:  Procedure Laterality Date  . INCISION AND DRAINAGE PERIRECTAL ABSCESS      History reviewed. No pertinent family history. Social History:  reports that he has quit smoking. His smoking use included Cigarettes. He smoked 0.33 packs per day. He has never used smokeless tobacco. He reports that he does not drink alcohol or use drugs.  Allergies:  Allergies  Allergen Reactions  . Augmentin [Amoxicillin-Pot Clavulanate]     PER Jail MAR   Medications Triumeq (abacavir-dolutegravir-lamivudine) augmentin (new Colace Ibuprofen Bactrim (PCP prophylaxis) bactrim   Results for orders placed or performed during the hospital encounter of 12/20/16 (from the past 48 hour(s))  Comprehensive metabolic panel     Status: Abnormal   Collection Time:  12/20/16  7:47 PM  Result Value Ref Range   Sodium 127 (L) 135 - 145 mmol/L   Potassium 5.4 (H) 3.5 - 5.1 mmol/L   Chloride 98 (L) 101 - 111 mmol/L   CO2 24 22 - 32 mmol/L   Glucose, Bld 89 65 - 99 mg/dL   BUN 13 6 - 20 mg/dL   Creatinine, Ser 1.02 0.61 - 1.24 mg/dL   Calcium 7.5 (L) 8.9 - 10.3 mg/dL   Total Protein 8.4 (H) 6.5 - 8.1 g/dL   Albumin 2.6 (L) 3.5 - 5.0 g/dL   AST 139 (H) 15 - 41 U/L   ALT 62 17 - 63 U/L   Alkaline Phosphatase 76 38 - 126 U/L   Total Bilirubin 1.3 (H) 0.3 - 1.2 mg/dL   GFR calc non Af Amer >60 >60 mL/min   GFR calc Af Amer >60 >60 mL/min    Comment: (NOTE) The eGFR has been calculated using the CKD EPI equation. This calculation has not been validated in all clinical situations. eGFR's persistently <60 mL/min signify possible Chronic Kidney Disease.    Anion gap 5 5 - 15  CBC     Status: Abnormal   Collection Time: 12/20/16  7:47 PM  Result Value Ref Range   WBC 6.8 4.0 - 10.5 K/uL   RBC 3.48 (L) 4.22 - 5.81 MIL/uL   Hemoglobin 9.1 (L) 13.0 - 17.0 g/dL  HCT 28.9 (L) 39.0 - 52.0 %   MCV 83.0 78.0 - 100.0 fL   MCH 26.1 26.0 - 34.0 pg   MCHC 31.5 30.0 - 36.0 g/dL   RDW 14.8 11.5 - 15.5 %   Platelets 110 (L) 150 - 400 K/uL    Comment: REPEATED TO VERIFY SPECIMEN CHECKED FOR CLOTS PLATELETS APPEAR DECREASED PLATELET COUNT CONFIRMED BY SMEAR   I-Stat CG4 Lactic Acid, ED     Status: Abnormal   Collection Time: 12/20/16  9:49 PM  Result Value Ref Range   Lactic Acid, Venous 2.33 (HH) 0.5 - 1.9 mmol/L   Comment NOTIFIED PHYSICIAN   I-Stat CG4 Lactic Acid, ED     Status: None   Collection Time: 12/20/16 10:01 PM  Result Value Ref Range   Lactic Acid, Venous 1.14 0.5 - 1.9 mmol/L   Dg Chest 2 View  Result Date: 12/20/2016 CLINICAL DATA:  Abdominal pain with nausea and vomiting and shortness of breath EXAM: CHEST  2 VIEW COMPARISON:  None. FINDINGS: The heart size and mediastinal contours are within normal limits. Both lungs are clear. The  visualized skeletal structures are unremarkable. IMPRESSION: No active cardiopulmonary disease. Electronically Signed   By: Inez Catalina M.D.   On: 12/20/2016 07:48    Review of Systems  Constitutional: Positive for chills and fever. Negative for weight loss.  HENT: Negative.   Eyes: Negative.   Respiratory: Negative.   Cardiovascular: Negative.   Gastrointestinal: Positive for abdominal pain and nausea.       Rectal pain  Genitourinary: Negative.   Musculoskeletal: Negative.   Skin: Negative.   Neurological: Negative.   Endo/Heme/Allergies: Negative.   Psychiatric/Behavioral: Negative.     Blood pressure 123/78, pulse 78, temperature (!) 102.1 F (38.9 C), resp. rate (!) 3, height '5\' 6"'  (1.676 m), weight 63.5 kg (140 lb), SpO2 97 %. Physical Exam  Constitutional: [name] oriented to person, place, and time. He appears well-developed and well-nourished. No distress.  HENT:  Head: Normocephalic and atraumatic.  Eyes: Conjunctivae are normal. Pupils are equal, round, and reactive to light. No scleral icterus.  Neck: Normal range of motion. Neck supple.  Cardiovascular: Normal rate and regular rhythm.   Respiratory: Effort normal. No respiratory distress.  GI: Soft. He exhibits no distension. There is no tenderness.  Genitourinary:     Genitourinary Comments: Red area designates significant tenderness. The black dots in the red area denote prior incisions/openings.  There is no active drainage in these locations.   Black loop is draining seton.  This is not tender.   Musculoskeletal: Normal range of motion. He exhibits no deformity.  Neurological: [name] alert and oriented to person, place, and time.  Skin: Skin is warm and dry. [name] not diaphoretic.  Numerous tattoos  Psychiatric: He has a normal mood and affect. His behavior is normal. Judgment and thought content normal.     Assessment/Plan Perirectal abscess Proctitis Possible  pneumonia hyponatremia AIDS Incarceration  Ct to better evaluate deep areas given recurrent and complicated nature of his perirectal history.  Especially if going to the OR, would be helpful to know if he has deep component.   CT does not show deep abscess.    Pt to be referred to medicine for admit given other issues. Will likely plan I&D later 5/23 unless other issues arise or if abscess spontaneously drains.   IV antibiotics   Lesly Pontarelli, MD 12/21/2016, 1:01 AM

## 2016-12-20 NOTE — ED Notes (Signed)
Pt is alert and oriented x 4 and is verbally responsive.  

## 2016-12-20 NOTE — Discharge Instructions (Signed)
Return here for purulent drainage from the rectum, fever above 101 that does not respond to Tylenol, or any other problems

## 2016-12-20 NOTE — ED Notes (Signed)
Pt transported to CT ?

## 2016-12-21 ENCOUNTER — Encounter (HOSPITAL_COMMUNITY): Payer: Self-pay | Admitting: Family Medicine

## 2016-12-21 ENCOUNTER — Encounter (HOSPITAL_COMMUNITY)
Admission: EM | Disposition: A | Discharge status: Skilled Nursing Facility | Payer: Self-pay | Source: Home / Self Care | Attending: Emergency Medicine

## 2016-12-21 ENCOUNTER — Inpatient Hospital Stay (HOSPITAL_COMMUNITY): Payer: Medicaid Other

## 2016-12-21 ENCOUNTER — Inpatient Hospital Stay (HOSPITAL_COMMUNITY): Payer: Medicaid Other | Admitting: Certified Registered Nurse Anesthetist

## 2016-12-21 ENCOUNTER — Ambulatory Visit: Payer: Self-pay | Admitting: Internal Medicine

## 2016-12-21 DIAGNOSIS — A419 Sepsis, unspecified organism: Secondary | ICD-10-CM | POA: Diagnosis not present

## 2016-12-21 DIAGNOSIS — D62 Acute posthemorrhagic anemia: Secondary | ICD-10-CM | POA: Diagnosis not present

## 2016-12-21 DIAGNOSIS — B370 Candidal stomatitis: Secondary | ICD-10-CM | POA: Diagnosis not present

## 2016-12-21 DIAGNOSIS — D61818 Other pancytopenia: Secondary | ICD-10-CM | POA: Diagnosis not present

## 2016-12-21 DIAGNOSIS — R591 Generalized enlarged lymph nodes: Secondary | ICD-10-CM | POA: Diagnosis not present

## 2016-12-21 DIAGNOSIS — E222 Syndrome of inappropriate secretion of antidiuretic hormone: Secondary | ICD-10-CM | POA: Diagnosis present

## 2016-12-21 DIAGNOSIS — D638 Anemia in other chronic diseases classified elsewhere: Secondary | ICD-10-CM | POA: Diagnosis not present

## 2016-12-21 DIAGNOSIS — Z4659 Encounter for fitting and adjustment of other gastrointestinal appliance and device: Secondary | ICD-10-CM | POA: Diagnosis not present

## 2016-12-21 DIAGNOSIS — G039 Meningitis, unspecified: Secondary | ICD-10-CM | POA: Diagnosis not present

## 2016-12-21 DIAGNOSIS — G9340 Encephalopathy, unspecified: Secondary | ICD-10-CM | POA: Diagnosis not present

## 2016-12-21 DIAGNOSIS — Z7189 Other specified counseling: Secondary | ICD-10-CM | POA: Diagnosis not present

## 2016-12-21 DIAGNOSIS — K611 Rectal abscess: Secondary | ICD-10-CM | POA: Diagnosis present

## 2016-12-21 DIAGNOSIS — J984 Other disorders of lung: Secondary | ICD-10-CM | POA: Diagnosis not present

## 2016-12-21 DIAGNOSIS — D649 Anemia, unspecified: Secondary | ICD-10-CM

## 2016-12-21 DIAGNOSIS — E871 Hypo-osmolality and hyponatremia: Secondary | ICD-10-CM | POA: Diagnosis present

## 2016-12-21 DIAGNOSIS — G0490 Encephalitis and encephalomyelitis, unspecified: Secondary | ICD-10-CM | POA: Diagnosis present

## 2016-12-21 DIAGNOSIS — N179 Acute kidney failure, unspecified: Secondary | ICD-10-CM | POA: Diagnosis not present

## 2016-12-21 DIAGNOSIS — J9600 Acute respiratory failure, unspecified whether with hypoxia or hypercapnia: Secondary | ICD-10-CM | POA: Diagnosis not present

## 2016-12-21 DIAGNOSIS — J189 Pneumonia, unspecified organism: Secondary | ICD-10-CM | POA: Diagnosis not present

## 2016-12-21 DIAGNOSIS — R7989 Other specified abnormal findings of blood chemistry: Secondary | ICD-10-CM

## 2016-12-21 DIAGNOSIS — R1310 Dysphagia, unspecified: Secondary | ICD-10-CM | POA: Diagnosis not present

## 2016-12-21 DIAGNOSIS — L270 Generalized skin eruption due to drugs and medicaments taken internally: Secondary | ICD-10-CM | POA: Diagnosis not present

## 2016-12-21 DIAGNOSIS — B20 Human immunodeficiency virus [HIV] disease: Secondary | ICD-10-CM | POA: Diagnosis present

## 2016-12-21 DIAGNOSIS — B451 Cerebral cryptococcosis: Secondary | ICD-10-CM | POA: Diagnosis present

## 2016-12-21 DIAGNOSIS — T360X5A Adverse effect of penicillins, initial encounter: Secondary | ICD-10-CM | POA: Diagnosis not present

## 2016-12-21 DIAGNOSIS — I639 Cerebral infarction, unspecified: Secondary | ICD-10-CM | POA: Diagnosis not present

## 2016-12-21 DIAGNOSIS — Z9114 Patient's other noncompliance with medication regimen: Secondary | ICD-10-CM | POA: Diagnosis not present

## 2016-12-21 DIAGNOSIS — K612 Anorectal abscess: Secondary | ICD-10-CM | POA: Diagnosis present

## 2016-12-21 DIAGNOSIS — G9341 Metabolic encephalopathy: Secondary | ICD-10-CM | POA: Diagnosis present

## 2016-12-21 DIAGNOSIS — R338 Other retention of urine: Secondary | ICD-10-CM | POA: Diagnosis not present

## 2016-12-21 DIAGNOSIS — Z87891 Personal history of nicotine dependence: Secondary | ICD-10-CM | POA: Diagnosis not present

## 2016-12-21 DIAGNOSIS — B450 Pulmonary cryptococcosis: Secondary | ICD-10-CM | POA: Diagnosis present

## 2016-12-21 DIAGNOSIS — E876 Hypokalemia: Secondary | ICD-10-CM | POA: Diagnosis not present

## 2016-12-21 DIAGNOSIS — D469 Myelodysplastic syndrome, unspecified: Secondary | ICD-10-CM | POA: Diagnosis not present

## 2016-12-21 DIAGNOSIS — Z880 Allergy status to penicillin: Secondary | ICD-10-CM | POA: Diagnosis not present

## 2016-12-21 DIAGNOSIS — G8929 Other chronic pain: Secondary | ICD-10-CM | POA: Diagnosis not present

## 2016-12-21 DIAGNOSIS — B458 Other forms of cryptococcosis: Secondary | ICD-10-CM | POA: Diagnosis not present

## 2016-12-21 DIAGNOSIS — Z978 Presence of other specified devices: Secondary | ICD-10-CM | POA: Diagnosis not present

## 2016-12-21 DIAGNOSIS — Z881 Allergy status to other antibiotic agents status: Secondary | ICD-10-CM | POA: Diagnosis not present

## 2016-12-21 DIAGNOSIS — R4182 Altered mental status, unspecified: Secondary | ICD-10-CM | POA: Diagnosis not present

## 2016-12-21 DIAGNOSIS — B459 Cryptococcosis, unspecified: Secondary | ICD-10-CM | POA: Diagnosis not present

## 2016-12-21 DIAGNOSIS — I6340 Cerebral infarction due to embolism of unspecified cerebral artery: Secondary | ICD-10-CM | POA: Diagnosis not present

## 2016-12-21 DIAGNOSIS — D696 Thrombocytopenia, unspecified: Secondary | ICD-10-CM | POA: Diagnosis not present

## 2016-12-21 DIAGNOSIS — Z91018 Allergy to other foods: Secondary | ICD-10-CM | POA: Diagnosis not present

## 2016-12-21 DIAGNOSIS — Z515 Encounter for palliative care: Secondary | ICD-10-CM | POA: Diagnosis not present

## 2016-12-21 DIAGNOSIS — G932 Benign intracranial hypertension: Secondary | ICD-10-CM | POA: Diagnosis not present

## 2016-12-21 DIAGNOSIS — Z833 Family history of diabetes mellitus: Secondary | ICD-10-CM | POA: Diagnosis not present

## 2016-12-21 DIAGNOSIS — J9601 Acute respiratory failure with hypoxia: Secondary | ICD-10-CM | POA: Diagnosis not present

## 2016-12-21 DIAGNOSIS — R945 Abnormal results of liver function studies: Secondary | ICD-10-CM

## 2016-12-21 DIAGNOSIS — B457 Disseminated cryptococcosis: Secondary | ICD-10-CM | POA: Diagnosis present

## 2016-12-21 DIAGNOSIS — E43 Unspecified severe protein-calorie malnutrition: Secondary | ICD-10-CM | POA: Diagnosis present

## 2016-12-21 DIAGNOSIS — I749 Embolism and thrombosis of unspecified artery: Secondary | ICD-10-CM | POA: Diagnosis not present

## 2016-12-21 DIAGNOSIS — J168 Pneumonia due to other specified infectious organisms: Secondary | ICD-10-CM | POA: Diagnosis present

## 2016-12-21 HISTORY — DX: Hypo-osmolality and hyponatremia: E87.1

## 2016-12-21 HISTORY — DX: Other specified abnormal findings of blood chemistry: R79.89

## 2016-12-21 HISTORY — PX: INCISION AND DRAINAGE PERIRECTAL ABSCESS: SHX1804

## 2016-12-21 HISTORY — DX: Anemia, unspecified: D64.9

## 2016-12-21 LAB — CBC
HCT: 28.8 % — ABNORMAL LOW (ref 39.0–52.0)
Hemoglobin: 8.9 g/dL — ABNORMAL LOW (ref 13.0–17.0)
MCH: 25.9 pg — ABNORMAL LOW (ref 26.0–34.0)
MCHC: 30.9 g/dL (ref 30.0–36.0)
MCV: 83.7 fL (ref 78.0–100.0)
PLATELETS: 111 10*3/uL — AB (ref 150–400)
RBC: 3.44 MIL/uL — AB (ref 4.22–5.81)
RDW: 14.9 % (ref 11.5–15.5)
WBC: 6.9 10*3/uL (ref 4.0–10.5)

## 2016-12-21 LAB — SURGICAL PCR SCREEN
MRSA, PCR: NEGATIVE
STAPHYLOCOCCUS AUREUS: NEGATIVE

## 2016-12-21 LAB — BASIC METABOLIC PANEL
ANION GAP: 4 — AB (ref 5–15)
BUN: 9 mg/dL (ref 6–20)
CO2: 24 mmol/L (ref 22–32)
Calcium: 7.8 mg/dL — ABNORMAL LOW (ref 8.9–10.3)
Chloride: 98 mmol/L — ABNORMAL LOW (ref 101–111)
Creatinine, Ser: 0.91 mg/dL (ref 0.61–1.24)
GFR calc Af Amer: >60 mL/min (ref >60)
Glucose, Bld: 85 mg/dL (ref 65–99)
POTASSIUM: 3.8 mmol/L (ref 3.5–5.1)
SODIUM: 126 mmol/L — AB (ref 135–145)

## 2016-12-21 LAB — T-HELPER CELLS (CD4) COUNT (NOT AT ARMC)
CD4 % Helper T Cell: 2 % — ABNORMAL LOW (ref 33–55)
CD4 T Cell Abs: 21 /uL — ABNORMAL LOW (ref 400–2700)

## 2016-12-21 LAB — CRYPTOCOCCAL ANTIGEN: CRYPTO AG: POSITIVE — AB

## 2016-12-21 LAB — OSMOLALITY: OSMOLALITY: 270 mosm/kg — AB (ref 275–295)

## 2016-12-21 SURGERY — INCISION AND DRAINAGE, ABSCESS, PERIRECTAL
Anesthesia: General

## 2016-12-21 MED ORDER — MORPHINE SULFATE (PF) 4 MG/ML IV SOLN
1.0000 mg | INTRAVENOUS | Status: DC | PRN
  Administered 2016-12-21 (×5): 2 mg via INTRAVENOUS
  Filled 2016-12-21 (×5): qty 1

## 2016-12-21 MED ORDER — CEFEPIME HCL 2 G IJ SOLR
2.0000 g | Freq: Three times a day (TID) | INTRAMUSCULAR | Status: DC
  Administered 2016-12-21: 2 g via INTRAVENOUS
  Filled 2016-12-21 (×3): qty 2

## 2016-12-21 MED ORDER — ABACAVIR-DOLUTEGRAVIR-LAMIVUD 600-50-300 MG PO TABS
1.0000 | ORAL_TABLET | Freq: Every day | ORAL | Status: DC
  Administered 2016-12-21: 1 via ORAL
  Filled 2016-12-21 (×3): qty 1

## 2016-12-21 MED ORDER — DOCUSATE SODIUM 100 MG PO CAPS: 100.0000 mg | ORAL_CAPSULE | Freq: Two times a day (BID) | ORAL | Status: DC

## 2016-12-21 MED ORDER — PIPERACILLIN-TAZOBACTAM 3.375 G IVPB: 3.3750 g | Freq: Three times a day (TID) | INTRAVENOUS | Status: DC

## 2016-12-21 MED ORDER — OXYCODONE HCL 5 MG PO TABS: 5.0000 mg | ORAL_TABLET | ORAL | Status: DC | PRN

## 2016-12-21 MED ORDER — DEXTROSE 5 % IV SOLN
2.0000 g | Freq: Once | INTRAVENOUS | Status: AC
  Administered 2016-12-21: 2 g via INTRAVENOUS
  Filled 2016-12-21: qty 2

## 2016-12-21 MED ORDER — ONDANSETRON HCL 4 MG/2ML IJ SOLN
4.0000 mg | Freq: Four times a day (QID) | INTRAMUSCULAR | Status: DC | PRN
  Administered 2016-12-22: 4 mg via INTRAVENOUS
  Filled 2016-12-21 (×3): qty 2

## 2016-12-21 MED ORDER — SULFAMETHOXAZOLE-TRIMETHOPRIM 800-160 MG PO TABS
1.0000 | ORAL_TABLET | Freq: Every day | ORAL | Status: DC
  Administered 2016-12-22 – 2016-12-23 (×2): 1 via ORAL
  Filled 2016-12-21 (×3): qty 1

## 2016-12-21 MED ORDER — AZITHROMYCIN 250 MG PO TABS: 250.0000 mg | ORAL_TABLET | Freq: Every day | ORAL | Status: DC

## 2016-12-21 MED ORDER — VANCOMYCIN HCL IN DEXTROSE 1-5 GM/200ML-% IV SOLN
1000.0000 mg | Freq: Once | INTRAVENOUS | Status: AC
  Administered 2016-12-21: 1000 mg via INTRAVENOUS
  Filled 2016-12-21: qty 200

## 2016-12-21 MED ORDER — ONDANSETRON 4 MG PO TBDP: 4.0000 mg | ORAL_TABLET | Freq: Four times a day (QID) | ORAL | Status: DC | PRN

## 2016-12-21 MED ORDER — ONDANSETRON 4 MG PO TBDP
4.0000 mg | ORAL_TABLET | Freq: Four times a day (QID) | ORAL | Status: DC | PRN
  Filled 2016-12-21: qty 1

## 2016-12-21 MED ORDER — ONDANSETRON 4 MG PO TBDP: 4.0000 mg | ORAL_TABLET | Freq: Three times a day (TID) | ORAL | Status: DC | PRN

## 2016-12-21 MED ORDER — HYDROMORPHONE HCL 1 MG/ML IJ SOLN
1.0000 mg | INTRAMUSCULAR | Status: DC | PRN
  Administered 2016-12-21 – 2016-12-22 (×5): 1 mg via INTRAVENOUS
  Filled 2016-12-21 (×5): qty 1

## 2016-12-21 MED ORDER — ENOXAPARIN SODIUM 30 MG/0.3ML ~~LOC~~ SOLN: 30.0000 mg | SUBCUTANEOUS | Status: DC

## 2016-12-21 MED ORDER — FLUCYTOSINE 250 MG PO CAPS
250.0000 mg | ORAL_CAPSULE | Freq: Four times a day (QID) | ORAL | Status: DC
  Filled 2016-12-21 (×2): qty 1

## 2016-12-21 MED ORDER — DIPHENHYDRAMINE HCL 25 MG PO CAPS
25.0000 mg | ORAL_CAPSULE | Freq: Four times a day (QID) | ORAL | Status: DC | PRN
  Administered 2016-12-24: 25 mg via ORAL
  Filled 2016-12-21: qty 1

## 2016-12-21 MED ORDER — ONDANSETRON HCL 4 MG/2ML IJ SOLN: 4.0000 mg | Freq: Four times a day (QID) | INTRAMUSCULAR | Status: DC | PRN

## 2016-12-21 MED ORDER — SODIUM CHLORIDE 0.9 % IV SOLN
INTRAVENOUS | Status: DC
  Administered 2016-12-21 – 2017-02-01 (×38): via INTRAVENOUS
  Administered 2017-02-02: 1000 mL via INTRAVENOUS
  Administered 2017-02-05: 06:00:00 via INTRAVENOUS

## 2016-12-21 MED ORDER — LACTATED RINGERS IV SOLN
INTRAVENOUS | Status: DC | PRN
  Administered 2016-12-21 (×2): via INTRAVENOUS

## 2016-12-21 MED ORDER — VANCOMYCIN HCL IN DEXTROSE 750-5 MG/150ML-% IV SOLN
750.0000 mg | Freq: Three times a day (TID) | INTRAVENOUS | Status: DC
  Administered 2016-12-21 – 2016-12-22 (×2): 750 mg via INTRAVENOUS
  Filled 2016-12-21 (×4): qty 150

## 2016-12-21 MED ORDER — KETOROLAC TROMETHAMINE 15 MG/ML IJ SOLN
15.0000 mg | Freq: Four times a day (QID) | INTRAMUSCULAR | Status: AC
  Administered 2016-12-21: 15 mg via INTRAVENOUS
  Filled 2016-12-21: qty 1

## 2016-12-21 MED ORDER — ACETAMINOPHEN 325 MG PO TABS: 650.0000 mg | ORAL_TABLET | Freq: Four times a day (QID) | ORAL | Status: DC | PRN

## 2016-12-21 MED ORDER — FENTANYL CITRATE (PF) 100 MCG/2ML IJ SOLN
INTRAMUSCULAR | Status: DC | PRN
  Administered 2016-12-21 (×3): 50 ug via INTRAVENOUS

## 2016-12-21 MED ORDER — ACETAMINOPHEN 650 MG RE SUPP: 650.0000 mg | Freq: Four times a day (QID) | RECTAL | Status: DC | PRN

## 2016-12-21 MED ORDER — DEXTROSE-NACL 5-0.9 % IV SOLN: INTRAVENOUS | Status: DC

## 2016-12-21 MED ORDER — SIMETHICONE 80 MG PO CHEW
40.0000 mg | CHEWABLE_TABLET | Freq: Four times a day (QID) | ORAL | Status: DC | PRN
  Filled 2016-12-21: qty 1

## 2016-12-21 MED ORDER — PROPOFOL 10 MG/ML IV BOLUS
INTRAVENOUS | Status: DC | PRN
  Administered 2016-12-21: 150 mg via INTRAVENOUS

## 2016-12-21 MED ORDER — SULFAMETHOXAZOLE-TRIMETHOPRIM 800-160 MG PO TABS: 1.0000 | ORAL_TABLET | Freq: Every day | ORAL | Status: DC

## 2016-12-21 MED ORDER — FENTANYL CITRATE (PF) 250 MCG/5ML IJ SOLN
INTRAMUSCULAR | Status: AC
  Filled 2016-12-21: qty 5

## 2016-12-21 MED ORDER — ACETAMINOPHEN 325 MG PO TABS: 975.0000 mg | ORAL_TABLET | Freq: Three times a day (TID) | ORAL | Status: DC | PRN

## 2016-12-21 MED ORDER — DIPHENHYDRAMINE HCL 50 MG/ML IJ SOLN
25.0000 mg | Freq: Four times a day (QID) | INTRAMUSCULAR | Status: DC | PRN
  Administered 2016-12-22: 25 mg via INTRAVENOUS
  Filled 2016-12-21: qty 1

## 2016-12-21 MED ORDER — OXYCODONE HCL 5 MG/5ML PO SOLN: 5.0000 mg | Freq: Once | ORAL | Status: DC | PRN

## 2016-12-21 MED ORDER — PIPERACILLIN-TAZOBACTAM 3.375 G IVPB 30 MIN: 3.3750 g | Freq: Once | INTRAVENOUS | Status: DC

## 2016-12-21 MED ORDER — HYDROMORPHONE HCL 1 MG/ML IJ SOLN: 0.2500 mg | INTRAMUSCULAR | Status: DC | PRN

## 2016-12-21 MED ORDER — FLUCYTOSINE 250 MG PO CAPS
1500.0000 mg | ORAL_CAPSULE | Freq: Four times a day (QID) | ORAL | Status: DC
  Administered 2016-12-22: 1500 mg via ORAL
  Filled 2016-12-21: qty 3
  Filled 2016-12-21: qty 6
  Filled 2016-12-21: qty 3

## 2016-12-21 MED ORDER — DEXTROSE 5 % IV SOLN
3.0000 mg/kg | INTRAVENOUS | Status: DC
  Administered 2016-12-22: 190 mg via INTRAVENOUS
  Filled 2016-12-21: qty 190

## 2016-12-21 MED ORDER — POLYETHYLENE GLYCOL 3350 17 G PO PACK: 17.0000 g | PACK | Freq: Every day | ORAL | Status: DC | PRN

## 2016-12-21 MED ORDER — MIDAZOLAM HCL 5 MG/5ML IJ SOLN
INTRAMUSCULAR | Status: DC | PRN
  Administered 2016-12-21: 2 mg via INTRAVENOUS

## 2016-12-21 MED ORDER — PROPOFOL 10 MG/ML IV BOLUS
INTRAVENOUS | Status: AC
  Filled 2016-12-21: qty 40

## 2016-12-21 MED ORDER — 0.9 % SODIUM CHLORIDE (POUR BTL) OPTIME
TOPICAL | Status: DC | PRN
  Administered 2016-12-21: 2000 mL

## 2016-12-21 MED ORDER — ENOXAPARIN SODIUM 40 MG/0.4ML ~~LOC~~ SOLN
40.0000 mg | SUBCUTANEOUS | Status: DC
Start: 2016-12-22 — End: 2016-12-21

## 2016-12-21 MED ORDER — SODIUM CHLORIDE 0.9 % IV BOLUS (SEPSIS)
500.0000 mL | INTRAVENOUS | Status: AC
  Administered 2016-12-22: 500 mL via INTRAVENOUS

## 2016-12-21 MED ORDER — SODIUM CHLORIDE 0.9 % IV BOLUS (SEPSIS)
500.0000 mL | INTRAVENOUS | Status: DC
  Administered 2016-12-22: 500 mL via INTRAVENOUS

## 2016-12-21 MED ORDER — POTASSIUM CHLORIDE IN NACL 20-0.45 MEQ/L-% IV SOLN
INTRAVENOUS | Status: DC
  Filled 2016-12-21: qty 1000

## 2016-12-21 MED ORDER — DIPHENHYDRAMINE HCL 12.5 MG/5ML PO ELIX: 12.5000 mg | ORAL_SOLUTION | Freq: Four times a day (QID) | ORAL | Status: DC | PRN

## 2016-12-21 MED ORDER — DEXTROSE 5 % IV SOLN
2.0000 g | INTRAVENOUS | Status: DC
  Administered 2016-12-21: 2 g via INTRAVENOUS
  Filled 2016-12-21 (×2): qty 2

## 2016-12-21 MED ORDER — MORPHINE SULFATE (PF) 4 MG/ML IV SOLN: 1.0000 mg | INTRAVENOUS | Status: DC | PRN

## 2016-12-21 MED ORDER — ONDANSETRON HCL 4 MG/2ML IJ SOLN
INTRAMUSCULAR | Status: DC | PRN
  Administered 2016-12-21: 4 mg via INTRAVENOUS

## 2016-12-21 MED ORDER — MIDAZOLAM HCL 2 MG/2ML IJ SOLN
INTRAMUSCULAR | Status: AC
  Filled 2016-12-21: qty 2

## 2016-12-21 MED ORDER — OXYCODONE HCL 5 MG PO TABS
5.0000 mg | ORAL_TABLET | ORAL | Status: DC | PRN
  Administered 2016-12-22 (×2): 10 mg via ORAL
  Filled 2016-12-21 (×2): qty 2

## 2016-12-21 MED ORDER — SODIUM CHLORIDE 0.9 % IV BOLUS (SEPSIS): 500.0000 mL | INTRAVENOUS | Status: DC

## 2016-12-21 MED ORDER — LIDOCAINE 2% (20 MG/ML) 5 ML SYRINGE
INTRAMUSCULAR | Status: DC | PRN
  Administered 2016-12-21: 50 mg via INTRAVENOUS

## 2016-12-21 MED ORDER — KETOROLAC TROMETHAMINE 15 MG/ML IJ SOLN
15.0000 mg | Freq: Four times a day (QID) | INTRAMUSCULAR | Status: DC | PRN
  Administered 2016-12-22 – 2016-12-26 (×4): 15 mg via INTRAVENOUS
  Filled 2016-12-21 (×4): qty 1

## 2016-12-21 MED ORDER — DIPHENHYDRAMINE HCL 50 MG/ML IJ SOLN: 12.5000 mg | Freq: Four times a day (QID) | INTRAMUSCULAR | Status: DC | PRN

## 2016-12-21 MED ORDER — OXYCODONE HCL 5 MG PO TABS: 5.0000 mg | ORAL_TABLET | Freq: Once | ORAL | Status: DC | PRN

## 2016-12-21 MED ORDER — DEXTROSE 5 % IV SOLN
2.0000 g | INTRAVENOUS | Status: DC
  Filled 2016-12-21: qty 2

## 2016-12-21 MED ORDER — AZITHROMYCIN 1 G PO PACK
1.0000 g | PACK | Freq: Once | ORAL | Status: AC
  Administered 2016-12-21: 1 g via ORAL
  Filled 2016-12-21: qty 1

## 2016-12-21 MED ORDER — METRONIDAZOLE IN NACL 5-0.79 MG/ML-% IV SOLN
500.0000 mg | Freq: Three times a day (TID) | INTRAVENOUS | Status: DC
  Administered 2016-12-21 – 2016-12-22 (×4): 500 mg via INTRAVENOUS
  Filled 2016-12-21 (×6): qty 100

## 2016-12-21 MED ORDER — METRONIDAZOLE IN NACL 5-0.79 MG/ML-% IV SOLN
500.0000 mg | Freq: Three times a day (TID) | INTRAVENOUS | Status: DC
Start: 2016-12-21 — End: 2016-12-21
  Administered 2016-12-21: 500 mg via INTRAVENOUS
  Filled 2016-12-21 (×3): qty 100

## 2016-12-21 SURGICAL SUPPLY — 45 items
BLADE SURG 10 STRL SS (BLADE) ×3 IMPLANT
BLADE SURG 15 STRL LF DISP TIS (BLADE) ×1 IMPLANT
BLADE SURG 15 STRL SS (BLADE) ×2
CANISTER SUCT 3000ML PPV (MISCELLANEOUS) ×3 IMPLANT
COVER SURGICAL LIGHT HANDLE (MISCELLANEOUS) ×3 IMPLANT
DRAPE STERI 35X30 U-POUCH (DRAPES) ×3 IMPLANT
DRAPE UTILITY XL STRL (DRAPES) IMPLANT
DRSG PAD ABDOMINAL 8X10 ST (GAUZE/BANDAGES/DRESSINGS) ×3 IMPLANT
ELECT CAUTERY BLADE 6.4 (BLADE) ×3 IMPLANT
ELECT REM PT RETURN 9FT ADLT (ELECTROSURGICAL) ×3
ELECTRODE REM PT RTRN 9FT ADLT (ELECTROSURGICAL) ×1 IMPLANT
GAUZE PACKING IODOFORM 1/4X15 (GAUZE/BANDAGES/DRESSINGS) ×3 IMPLANT
GAUZE SPONGE 4X4 12PLY STRL (GAUZE/BANDAGES/DRESSINGS) IMPLANT
GAUZE SPONGE 4X4 12PLY STRL LF (GAUZE/BANDAGES/DRESSINGS) ×3 IMPLANT
GLOVE BIO SURGEON STRL SZ7.5 (GLOVE) ×3 IMPLANT
GLOVE BIOGEL M 6.5 STRL (GLOVE) ×12 IMPLANT
GLOVE BIOGEL PI IND STRL 6 (GLOVE) ×1 IMPLANT
GLOVE BIOGEL PI IND STRL 8 (GLOVE) ×1 IMPLANT
GLOVE BIOGEL PI INDICATOR 6 (GLOVE) ×2
GLOVE BIOGEL PI INDICATOR 8 (GLOVE) ×2
GOWN STRL REUS W/ TWL LRG LVL3 (GOWN DISPOSABLE) ×3 IMPLANT
GOWN STRL REUS W/ TWL XL LVL3 (GOWN DISPOSABLE) IMPLANT
GOWN STRL REUS W/TWL LRG LVL3 (GOWN DISPOSABLE) ×6
GOWN STRL REUS W/TWL XL LVL3 (GOWN DISPOSABLE)
IV CATH 22GX1 FEP (IV SOLUTION) ×3 IMPLANT
KIT BASIN OR (CUSTOM PROCEDURE TRAY) ×3 IMPLANT
KIT ROOM TURNOVER OR (KITS) ×3 IMPLANT
NS IRRIG 1000ML POUR BTL (IV SOLUTION) ×6 IMPLANT
PACK LITHOTOMY IV (CUSTOM PROCEDURE TRAY) ×3 IMPLANT
PAD ARMBOARD 7.5X6 YLW CONV (MISCELLANEOUS) ×3 IMPLANT
PENCIL BUTTON HOLSTER BLD 10FT (ELECTRODE) ×3 IMPLANT
SPONGE LAP 18X18 X RAY DECT (DISPOSABLE) ×3 IMPLANT
SURGILUBE 2OZ TUBE FLIPTOP (MISCELLANEOUS) ×3 IMPLANT
SWAB COLLECTION DEVICE MRSA (MISCELLANEOUS) ×3 IMPLANT
SWAB CULTURE ESWAB REG 1ML (MISCELLANEOUS) IMPLANT
SWAB CULTURE LIQUID MINI MALE (MISCELLANEOUS) ×3 IMPLANT
SYR 30ML LL (SYRINGE) ×3 IMPLANT
SYR BULB IRRIGATION 50ML (SYRINGE) ×3 IMPLANT
TOWEL OR 17X24 6PK STRL BLUE (TOWEL DISPOSABLE) ×6 IMPLANT
TOWEL OR 17X26 10 PK STRL BLUE (TOWEL DISPOSABLE) IMPLANT
TUBE CONNECTING 12'X1/4 (SUCTIONS) ×1
TUBE CONNECTING 12X1/4 (SUCTIONS) ×2 IMPLANT
UNDERPAD 30X30 (UNDERPADS AND DIAPERS) ×9 IMPLANT
WATER STERILE IRR 1000ML POUR (IV SOLUTION) ×6 IMPLANT
YANKAUER SUCT BULB TIP NO VENT (SUCTIONS) ×3 IMPLANT

## 2016-12-21 NOTE — Anesthesia Preprocedure Evaluation (Signed)
Anesthesia Evaluation  Patient identified by MRN, date of birth, ID band Patient awake    Reviewed: Allergy & Precautions, NPO status , Patient's Chart, lab work & pertinent test results  Airway Mallampati: II   Neck ROM: full    Dental   Pulmonary former smoker,    breath sounds clear to auscultation       Cardiovascular negative cardio ROS   Rhythm:regular Rate:Normal     Neuro/Psych    GI/Hepatic   Endo/Other    Renal/GU      Musculoskeletal   Abdominal   Peds  Hematology  (+) Blood dyscrasia, anemia , HIV,   Anesthesia Other Findings   Reproductive/Obstetrics                             Anesthesia Physical Anesthesia Plan  ASA: II  Anesthesia Plan: General   Post-op Pain Management:    Induction: Intravenous  Airway Management Planned: LMA  Additional Equipment:   Intra-op Plan:   Post-operative Plan:   Informed Consent: I have reviewed the patients History and Physical, chart, labs and discussed the procedure including the risks, benefits and alternatives for the proposed anesthesia with the patient or authorized representative who has indicated his/her understanding and acceptance.     Plan Discussed with: CRNA, Anesthesiologist and Surgeon  Anesthesia Plan Comments:         Anesthesia Quick Evaluation

## 2016-12-21 NOTE — Anesthesia Postprocedure Evaluation (Signed)
Anesthesia Post Note  Patient: William Pena  Procedure(s) Performed: Procedure(s) (LRB): IRRIGATION AND DEBRIDEMENT PERIRECTAL ABSCESS (N/A)  Patient location during evaluation: PACU Anesthesia Type: General Level of consciousness: awake and alert Pain management: pain level controlled Vital Signs Assessment: post-procedure vital signs reviewed and stable Respiratory status: spontaneous breathing, nonlabored ventilation and respiratory function stable Cardiovascular status: blood pressure returned to baseline and stable Postop Assessment: no signs of nausea or vomiting Anesthetic complications: no       Last Vitals:  Vitals:   12/21/16 1511 12/21/16 1525  BP: 139/88 (!) 149/91  Pulse:  76  Resp: 16 18  Temp: 36.6 C 36.5 C    Last Pain:  Vitals:   12/21/16 1525  TempSrc: Oral  PainSc:                  William Pena,William Pena

## 2016-12-21 NOTE — Consult Note (Signed)
Hospitalist Service Medical Consultation   William Pena  DTO:[id number]  DOB: Jul 18, 1986  DOA: 12/20/2016  PCP: [name], MD   Outpatient Specialists:  Dr. Tobie Poet, [facility]     Dr. Ronita Hipps, [facility]   Requesting physician: Stark Klein, MD  Reason for consultation: HIV/AIDS, hyponatremia   History of Present Illness: William Pena is an 31 y.o. male with HIV/AIDS last CD4 count <50 in Jan who presents with recurrent perirectal abscess.  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  He was seen at [facility] in Dec, had I&D in the OR, then returned in January 2018 one month later with worsening, again went for drainage, had drain and seton placed.    Since then, he has had pain but no fever, chills, purulent drainage, need for repeat antibiotics.  He restarted ART in late Feb, was taking daily until about 6 weeks ago when he was incarcerated in [name].  Since being in [facility] he has not been given either his ART or PCP ppx.  He has been having to change his dressings himself, which he cannot see to do.  Now, in the last week, he has had intermittent fevers, chills, worsening rectal pain, new brown and green drainage, nausea and vomiting.  Seen four days ago for N/V, found to have new abscesses on CT, given oral antibiotics Augmentin and instructions to follow up with his surgeon at [facility] which he cannot do because he is in jail.  Seen again this morning for fever, worsening pain, given Flagyl and clindamycin, discharged on both orally and discharged.  Now, tonight, worse fever, too weak to stand, was brought back.  ED course: -Temp 103.79F, heart rate 78, respirations and pulse ox normal, BP 123/91 -Na 127, K 5.4, Cr 0.93 (baseline 0.9), WBC 6.8K, Hgb 9.1 (baseline) -Lactic acid 1.04 -UA clear -CXR clear -CT abdomen pelvis showed small  subcutaneous abscess again, mild right airspace opacity -Blood cultures were obtained and he was given vanc and Zosyn for perirectal abscess and Zithromax presumably for empiric coverage of chlamydia -Case was discussed with Gen Surg who recommended operative debridement today       Review of Systems:  Review of Systems  Constitutional: Positive for chills, fever and malaise/fatigue.  Respiratory: Negative for cough, hemoptysis, sputum production and shortness of breath.   Cardiovascular: Negative for chest pain.  Neurological: Positive for weakness.     Past Medical History: Past Medical History:  Diagnosis Date  . HIV (human immunodeficiency virus infection) ([place])   . Perirectal abscess     Past Surgical History: Past Surgical History:  Procedure Laterality Date  . INCISION AND DRAINAGE PERIRECTAL ABSCESS       Allergies:   Allergies  Allergen Reactions  . Augmentin [Amoxicillin-Pot Clavulanate]     "break out"     Social History:   From [place], lived with GF before incarceration he claims.  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  Old notes report he was homeless.  Nonsmoker.  Denies IVDU.     Family History: Family History  Problem Relation Age of Onset  . Diabetes Other      Physical Exam: Vitals:   12/20/16 2215 12/20/16 2230 12/21/16 0051 12/21/16 0143  BP: 102/65 123/78 139/82   Pulse: 81 78  Resp:      Temp:    100.1 F (37.8 C)  TempSrc:    Oral  SpO2: 98% 97%    Weight:      Height:        Constitutional: Alert and awake, oriented x3, not in any acute distress.  Listless. Eyes: PERLA, EOMI, irises appear normal, anicteric sclera,  ENMT: external ears and nose appear normal, hearing normal            Lips appears normal, oropharynx mucosa, tongue, posterior pharynx appear normal  Lymph: posterior auricular nodes, submandibular nodes, in symmetric distribution, no supraclavic, axillary Neck: neck appears normal, no masses other than nodes  as above, normal ROM, no thyromegaly, no JVD  CVS: S1-S2 clear, no murmur rubs or gallops, no LE edema, normal pedal pulses  Respiratory:  no wheezing, rales on right. Respiratory effort normal. No accessory muscle use.  GI: Tender on left, voluntary guarding, no rigidity, no hepatomegaly that I can appreciate  Musculoskeletal: no cyanosis, clubbing or edema noted bilaterally Neuro: Cranial nerves II-XII intact, strength, sensation normal Psych: judgement and insight appear normal, stable mood and affect, mental status Skin: no rashes or lesions or ulcers, no induration or nodules, rectal exam deferred    Data reviewed:  I have personally reviewed following labs and imaging studies Labs:  CBC:  Recent Labs Lab 12/17/16 2001 12/17/16 2033 12/20/16 0647 12/20/16 1947  WBC 6.3  --  6.9 6.8  NEUTROABS 5.4  --  5.9  --   HGB 9.6* 11.2* 9.9* 9.1*  HCT 31.2* 33.0* 31.1* 28.9*  MCV 85.5  --  83.4 83.0  PLT 191  --  131* 110*    Basic Metabolic Panel:  Recent Labs Lab 12/17/16 2001 12/17/16 2033 12/20/16 0647 12/20/16 1947  NA 135 137 128* 127*  K 3.7 3.9 3.8 5.4*  CL 97* 97* 94* 98*  CO2 30  --  25 24  GLUCOSE 91 95 93 89  BUN 17 19 16 13   CREATININE 0.88 0.80 0.93 1.02  CALCIUM 8.9  --  8.2* 7.5*   GFR Estimated Creatinine Clearance: 94.2 mL/min (by C-G formula based on SCr of 1.02 mg/dL). Liver Function Tests:  Recent Labs Lab 12/17/16 2001 12/20/16 0647 12/20/16 1947  AST 21 62* 139*  ALT 13* 38 62  ALKPHOS 69 75 76  BILITOT 1.0 0.6 1.3*  PROT 9.9* 9.2* 8.4*  ALBUMIN 3.2* 2.9* 2.6*   No results for input(s): LIPASE, AMYLASE in the last 168 hours. No results for input(s): AMMONIA in the last 168 hours. Coagulation profile No results for input(s): INR, PROTIME in the last 168 hours.  Cardiac Enzymes: No results for input(s): CKTOTAL, CKMB, CKMBINDEX, TROPONINI in the last 168 hours. BNP: Invalid input(s): POCBNP CBG: No results for input(s): GLUCAP in  the last 168 hours. D-Dimer No results for input(s): DDIMER in the last 72 hours. Hgb A1c No results for input(s): HGBA1C in the last 72 hours. Lipid Profile No results for input(s): CHOL, HDL, LDLCALC, TRIG, CHOLHDL, LDLDIRECT in the last 72 hours. Thyroid function studies No results for input(s): TSH, T4TOTAL, T3FREE, THYROIDAB in the last 72 hours.  Invalid input(s): FREET3 Anemia work up No results for input(s): VITAMINB12, FOLATE, FERRITIN, TIBC, IRON, RETICCTPCT in the last 72 hours. Urinalysis    Component Value Date/Time   COLORURINE YELLOW 12/20/2016 0706   APPEARANCEUR CLEAR 12/20/2016 0706   LABSPEC 1.026 12/20/2016 0706   PHURINE 6.0 12/20/2016 0706   GLUCOSEU NEGATIVE 12/20/2016  [place] 12/20/2016 0706   BILIRUBINUR NEGATIVE 12/20/2016 0706   KETONESUR NEGATIVE 12/20/2016 0706   PROTEINUR 100 (A) 12/20/2016 0706   NITRITE NEGATIVE 12/20/2016 0706   LEUKOCYTESUR NEGATIVE 12/20/2016 0706     Sepsis Labs Invalid input(s): PROCALCITONIN,  WBC,  LACTICIDVEN Microbiology No results found for this or any previous visit (from the past 240 hour(s)).     Inpatient Medications:   Scheduled Meds: . abacavir-dolutegravir-lamiVUDine  1 tablet Oral Daily  . azithromycin  1 g Oral Once  . iopamidol      . sulfamethoxazole-trimethoprim  1 tablet Oral Daily   Continuous Infusions: . ceFEPime (MAXIPIME) IV    . ceFEPime (MAXIPIME) IV    . metronidazole    . vancomycin    . vancomycin       Radiological Exams on Admission: CXR reviewed, I do not appreciate a RLL opacity; CT report reviewed; CT lung fields reviewed, agree looks like infiltrate Dg Chest 2 View  Result Date: 12/20/2016 CLINICAL DATA:  Abdominal pain with nausea and vomiting and shortness of breath EXAM: CHEST  2 VIEW COMPARISON:  None. FINDINGS: The heart size and mediastinal contours are within normal limits. Both lungs are clear. The visualized skeletal structures are unremarkable.  IMPRESSION: No active cardiopulmonary disease. Electronically Signed   By: Inez Catalina M.D.   On: 12/20/2016 07:48   Ct Abdomen Pelvis W Contrast  Result Date: 12/21/2016 CLINICAL DATA:  Known rectal abscesses, status post incision and drainage. Personal history of HIV. Initial encounter. EXAM: CT ABDOMEN AND PELVIS WITH CONTRAST TECHNIQUE: Multidetector CT imaging of the abdomen and pelvis was performed using the standard protocol following bolus administration of intravenous contrast. CONTRAST:  138mL ISOVUE-300 IOPAMIDOL (ISOVUE-300) INJECTION 61% COMPARISON:  CT of the abdomen and pelvis performed 12/17/2016 FINDINGS: Lower chest: Mild right basilar airspace opacity raises concern for pneumonia. A 1.6 cm azygoesophageal recess node is seen. Hepatobiliary: The liver is unremarkable in appearance. A stone is noted dependently within the gallbladder. The gallbladder is otherwise unremarkable. The common bile duct remains normal in caliber. Pancreas: The pancreas is within normal limits. Spleen: The spleen is unremarkable in appearance. Adrenals/Urinary Tract: The adrenal glands are unremarkable in appearance. The kidneys are within normal limits. There is no evidence of hydronephrosis. No renal or ureteral stones are identified. No perinephric stranding is seen. Stomach/Bowel: The stomach is unremarkable in appearance. The small bowel is within normal limits. The patient is status post appendectomy. Mild wall thickening is noted along the distal sigmoid colon and rectum, raising concern for proctitis. The remainder of the colon is filled with contrast and is unremarkable in appearance. Vascular/Lymphatic: The abdominal aorta is unremarkable in appearance. The inferior vena cava is grossly unremarkable. Prominent retroperitoneal nodes are seen, measuring up to 1.1 cm in short axis. Prominent nodes are also noted at both sides of the pelvis, measuring up to 1.7 cm in short axis. Enlarged bilateral inguinal nodes  are seen, measuring up to 1.6 cm in short axis. Reproductive: The bladder is mildly distended and contains trace contrast. The prostate remains normal in size. Other: Trace ascites is noted about the right side of the abdomen. Diffuse presacral soft tissue inflammation is again noted. There is diffuse soft tissue edema tracking about the anorectal canal, without definite evidence of abscess. Edema is seen tracking along the right side of the gluteal cleft, similar in appearance to the prior study. This may reflect a persistent subcutaneous abscess, given stability from the prior  study, though not well assessed on delayed images. A small drainage catheter is noted posterior to the anorectal canal. Scrotal wall edema is noted, worse on the right, with small bilateral hydroceles. Musculoskeletal: No acute osseous abnormalities are identified. The visualized musculature is unremarkable in appearance. IMPRESSION: 1. Edema tracking along the right side of the gluteal cleft is similar in appearance to the prior study. This reflected a subcutaneous abscess on the prior study. This most likely reflects a persistent small subcutaneous abscess. 2. Diffuse soft tissue edema tracking about the anorectal canal, without additional abscess. Diffuse presacral soft tissue inflammation noted. 3. Scrotal wall edema, worse on the right, with small bilateral hydroceles. 4. Mild right basilar airspace opacity raises concern for pneumonia. 5. Mild wall thickening along the distal sigmoid colon and rectum, raising concern for proctitis. 6. Enlarged azygoesophageal recess, retroperitoneal, pelvic sidewall and bilateral inguinal nodes seen, measuring up to 1.7 cm in short axis. This likely reflects the underlying infection. 7. Cholelithiasis.  Gallbladder otherwise unremarkable. Electronically Signed   By: Garald Balding M.D.   On: 12/21/2016 01:29     ECG: Rate 80, QTc 402, normal sinus rhytthm. Independently  reviewed.         Impression/Recommendations   1. Perirectal abscess Does not appear to meet SIRS/sepsis criteria at admission.  Culture from [facility] from [place] in Jan showed GPCs and anaerobes. -Per General Surgery -Agree with vancomycin, cefepime    2. HIV/AIDS: -F/u Viral load, CD4 -Restart Triumeq -Restart Bactrim daily for PCP ppx -Consult Social Work, patient states his lawyer can get him out on unsecured bond for medical necessity  3. Hyponatremia: -Check urine Na, free water clearance -IVF and trend Na  4. Anemia: Normocytic, stable.    5. Elevated LFTs: Mild. -Trend  6. Abnormal lung CT: Patient asymptomatic and CXR clear, but rales on exam correspond to CT findings.   -Covered by vancomycin, cefepime -Add azithromycin for 5 days         Thank you for this consultation.  Our [facility] hospitalist team will follow the patient with you.     Edwin Dada M.D. [facility] 12/21/2016, 2:04 AM

## 2016-12-21 NOTE — ED Notes (Signed)
Per Dr. Barry Dienes pt only to have ice chips until 3a then NPO

## 2016-12-21 NOTE — ED Notes (Signed)
Pt ambulated to restroom. 

## 2016-12-21 NOTE — ED Notes (Signed)
Pt transported back to CT 

## 2016-12-21 NOTE — Progress Notes (Addendum)
Consult  PROGRESS NOTE                                                                                                                                                                                                             Patient Demographics:    William Pena, is a 31 y.o. male, DOB - Jul 02, 1986, VZD:[id number]  Admit date - 12/20/2016   Admitting Physician Md Edison Pace, MD  Outpatient Primary MD for the patient is [name], [name], MD  LOS - 0   Chief Complaint  Patient presents with  . Rectal Bleeding  . Generalized Body Aches       Brief Narrative   This is a no charge note, patient seen earlier by Dr. Loleta Books as a consult, chart, labs and imaging were reviewed.   Subjective:    William Pena today has, No headache, No chest pain, No abdominal pain -Had fever 103 on presentation.    Assessment  & Plan :    Principal Problem:   Perirectal abscess Active Problems:   Chronic pain   AIDS (acquired immunodeficiency syndrome), CD4 <=200 (HCC)   Hyponatremia   Normocytic anemia   Elevated LFTs    Perirectal abscess - Management per primary surgical team, and for I&D today. - Culture from [place] from [place] in Jan showed GPCs and anaerobes. - Per General Surgery - On IV vancomycin and cefepime, ID to see today .  HIV/AIDS: - F/u Viral load, CD4, most recent CD4 count is one on 10/04/2016 at [facility]. - Restart Triumeq, Restart Bactrim daily for PCP ppx - Consult Social Work, patient states his lawyer can get him out on unsecured bond for medical necessity  Hyponatremia: - Sodium is 126 today, patient appears to be volume depleted, will start on IV normal saline.  Anemia: - Normocytic, stable.    Elevated LFTs: - Mild.Trend  Abnormal lung CT: - Patient asymptomatic and CXR clear, but rales on exam correspond to CT findings.   - Covered by vancomycin, cefepime - Add azithromycin for 5 days      Code Status :  Full  Family Communication  : D/W patient  Procedures  : None  DVT Prophylaxis  : SCDs  Lab Results  Component Value Date   PLT 111 (L) 12/21/2016    Antibiotics  :    Anti-infectives  Start     Dose/Rate Route Frequency Ordered Stop   12/22/16 1000  azithromycin (ZITHROMAX) tablet 250 mg  Status:  Discontinued     250 mg Oral Daily 12/21/16 0224 12/21/16 1131   12/21/16 1600  cefTRIAXone (ROCEPHIN) 2 g in dextrose 5 % 50 mL IVPB     2 g 100 mL/hr over 30 Minutes Intravenous Every 24 hours 12/21/16 1131     12/21/16 1400  vancomycin (VANCOCIN) IVPB 750 mg/150 ml premix     750 mg 150 mL/hr over 60 Minutes Intravenous Every 8 hours 12/21/16 0142     12/21/16 1000  sulfamethoxazole-trimethoprim (BACTRIM DS,SEPTRA DS) 800-160 MG per tablet 1 tablet  Status:  Discontinued     1 tablet Oral Daily 12/21/16 0058 12/21/16 0100   12/21/16 1000  abacavir-dolutegravir-lamiVUDine (TRIUMEQ) 600-50-300 MG per tablet 1 tablet     1 tablet Oral Daily 12/21/16 0204     12/21/16 1000  sulfamethoxazole-trimethoprim (BACTRIM DS,SEPTRA DS) 800-160 MG per tablet 1 tablet     1 tablet Oral Daily 12/21/16 0204     12/21/16 0800  ceFEPIme (MAXIPIME) 2 g in dextrose 5 % 50 mL IVPB  Status:  Discontinued     2 g 100 mL/hr over 30 Minutes Intravenous Every 8 hours 12/21/16 0137 12/21/16 1131   12/21/16 0600  piperacillin-tazobactam (ZOSYN) IVPB 3.375 g  Status:  Discontinued     3.375 g 12.5 mL/hr over 240 Minutes Intravenous Every 8 hours 12/21/16 0058 12/21/16 0132   12/21/16 0200  metroNIDAZOLE (FLAGYL) IVPB 500 mg     500 mg 100 mL/hr over 60 Minutes Intravenous Every 8 hours 12/21/16 0137     12/21/16 0145  ceFEPIme (MAXIPIME) 2 g in dextrose 5 % 50 mL IVPB     2 g 100 mL/hr over 30 Minutes Intravenous  Once 12/21/16 0137 12/21/16 0358   12/21/16 0145  vancomycin (VANCOCIN) IVPB 1000 mg/200 mL premix     1,000 mg 200 mL/hr over 60 Minutes Intravenous  Once 12/21/16 0142 12/21/16 0429    12/21/16 0130  piperacillin-tazobactam (ZOSYN) IVPB 3.375 g  Status:  Discontinued     3.375 g 100 mL/hr over 30 Minutes Intravenous  Once 12/21/16 0115 12/21/16 0132   12/21/16 0130  azithromycin (ZITHROMAX) powder 1 g     1 g Oral  Once 12/21/16 0125 12/21/16 0328        Objective:   Vitals:   12/21/16 0051 12/21/16 0143 12/21/16 0223 12/21/16 0459  BP: 139/82  124/71 119/82  Pulse:   65 62  Resp:   18 17  Temp:  100.1 F (37.8 C) (!) 100.8 F (38.2 C) 99.9 F (37.7 C)  TempSrc:  Oral Oral Oral  SpO2:   97% 98%  Weight:   64.2 kg (141 lb 8 oz)   Height:   5\' 6"  (1.676 m)     Wt Readings from Last 3 Encounters:  12/21/16 64.2 kg (141 lb 8 oz)  12/20/16 65.8 kg (145 lb)  12/17/16 65.8 kg (145 lb)     Intake/Output Summary (Last 24 hours) at 12/21/16 1133 Last data filed at 12/21/16 0900  Gross per 24 hour  Intake              350 ml  Output              600 ml  Net             -250 ml     Physical  Exam  Sleeping comfortably in no apparent distress  Symmetrical Chest wall movement, Good air movement bilaterally, CTAB RRR,No Gallops,Rubs or new Murmurs, No Parasternal Heave +ve B.Sounds, Abd Soft, No tenderness,  No rebound - guarding or rigidity. No Cyanosis, Clubbing or edema, No new Rash or bruise     Data Review:    CBC  Recent Labs Lab 12/17/16 2001 12/17/16 2033 12/20/16 0647 12/20/16 1947 12/21/16 0825  WBC 6.3  --  6.9 6.8 6.9  HGB 9.6* 11.2* 9.9* 9.1* 8.9*  HCT 31.2* 33.0* 31.1* 28.9* 28.8*  PLT 191  --  131* 110* 111*  MCV 85.5  --  83.4 83.0 83.7  MCH 26.3  --  26.5 26.1 25.9*  MCHC 30.8  --  31.8 31.5 30.9  RDW 14.9  --  14.8 14.8 14.9  LYMPHSABS 0.5*  --  0.6*  --   --   MONOABS 0.1  --  0.2  --   --   EOSABS 0.0  --  0.1  --   --   BASOSABS 0.0  --  0.1  --   --     Chemistries   Recent Labs Lab 12/17/16 2001 12/17/16 2033 12/20/16 0647 12/20/16 1947 12/21/16 0825  NA 135 137 128* 127* 126*  K 3.7 3.9 3.8 5.4* 3.8  CL  97* 97* 94* 98* 98*  CO2 30  --  25 24 24   GLUCOSE 91 95 93 89 85  BUN 17 19 16 13 9   CREATININE 0.88 0.80 0.93 1.02 0.91  CALCIUM 8.9  --  8.2* 7.5* 7.8*  AST 21  --  62* 139*  --   ALT 13*  --  38 62  --   ALKPHOS 69  --  75 76  --   BILITOT 1.0  --  0.6 1.3*  --    ------------------------------------------------------------------------------------------------------------------ No results for input(s): CHOL, HDL, LDLCALC, TRIG, CHOLHDL, LDLDIRECT in the last 72 hours.  No results found for: HGBA1C ------------------------------------------------------------------------------------------------------------------ No results for input(s): TSH, T4TOTAL, T3FREE, THYROIDAB in the last 72 hours.  Invalid input(s): FREET3 ------------------------------------------------------------------------------------------------------------------ No results for input(s): VITAMINB12, FOLATE, FERRITIN, TIBC, IRON, RETICCTPCT in the last 72 hours.  Coagulation profile No results for input(s): INR, PROTIME in the last 168 hours.  No results for input(s): DDIMER in the last 72 hours.  Cardiac Enzymes No results for input(s): CKMB, TROPONINI, MYOGLOBIN in the last 168 hours.  Invalid input(s): CK ------------------------------------------------------------------------------------------------------------------ No results found for: BNP  Inpatient Medications  Scheduled Meds: . abacavir-dolutegravir-lamiVUDine  1 tablet Oral Daily  . sulfamethoxazole-trimethoprim  1 tablet Oral Daily   Continuous Infusions: . sodium chloride 100 mL/hr at 12/21/16 0922  . cefTRIAXone (ROCEPHIN)  IV    . metronidazole 500 mg (12/21/16 1007)  . vancomycin     PRN Meds:.morphine injection, simethicone  Micro Results Recent Results (from the past 240 hour(s))  Surgical pcr screen     Status: None   Collection Time: 12/21/16  2:25 AM  Result Value Ref Range Status   MRSA, PCR NEGATIVE NEGATIVE Final    Staphylococcus aureus NEGATIVE NEGATIVE Final    Comment:        The Xpert SA Assay (FDA approved for NASAL specimens in patients over 81 years of age), is one component of a comprehensive surveillance program.  Test performance has been validated by [name] for patients greater than or equal to 5 year old. It is not intended to diagnose infection nor to guide or monitor treatment.  Radiology Reports Dg Chest 2 View  Result Date: 12/20/2016 CLINICAL DATA:  Abdominal pain with nausea and vomiting and shortness of breath EXAM: CHEST  2 VIEW COMPARISON:  None. FINDINGS: The heart size and mediastinal contours are within normal limits. Both lungs are clear. The visualized skeletal structures are unremarkable. IMPRESSION: No active cardiopulmonary disease. Electronically Signed   By: Inez Catalina M.D.   On: 12/20/2016 07:48   Ct Head Wo Contrast  Result Date: 12/17/2016 CLINICAL DATA:  Headache EXAM: CT HEAD WITHOUT CONTRAST TECHNIQUE: Contiguous axial images were obtained from the base of the skull through the vertex without intravenous contrast. COMPARISON:  None. FINDINGS: Brain: No evidence of acute infarction, hemorrhage, hydrocephalus, extra-axial collection or mass lesion/mass effect. Vascular: No hyperdense vessel or unexpected calcification. Skull: Normal. Negative for fracture or focal lesion. Sinuses/Orbits: No acute finding. Other: None IMPRESSION: No acute intracranial abnormality. Electronically Signed   By: Ashley Royalty M.D.   On: 12/17/2016 21:58   Ct Abdomen Pelvis W Contrast  Result Date: 12/21/2016 CLINICAL DATA:  Known rectal abscesses, status post incision and drainage. Personal history of HIV. Initial encounter. EXAM: CT ABDOMEN AND PELVIS WITH CONTRAST TECHNIQUE: Multidetector CT imaging of the abdomen and pelvis was performed using the standard protocol following bolus administration of intravenous contrast. CONTRAST:  115mL ISOVUE-300 IOPAMIDOL (ISOVUE-300)  INJECTION 61% COMPARISON:  CT of the abdomen and pelvis performed 12/17/2016 FINDINGS: Lower chest: Mild right basilar airspace opacity raises concern for pneumonia. A 1.6 cm azygoesophageal recess node is seen. Hepatobiliary: The liver is unremarkable in appearance. A stone is noted dependently within the gallbladder. The gallbladder is otherwise unremarkable. The common bile duct remains normal in caliber. Pancreas: The pancreas is within normal limits. Spleen: The spleen is unremarkable in appearance. Adrenals/Urinary Tract: The adrenal glands are unremarkable in appearance. The kidneys are within normal limits. There is no evidence of hydronephrosis. No renal or ureteral stones are identified. No perinephric stranding is seen. Stomach/Bowel: The stomach is unremarkable in appearance. The small bowel is within normal limits. The patient is status post appendectomy. Mild wall thickening is noted along the distal sigmoid colon and rectum, raising concern for proctitis. The remainder of the colon is filled with contrast and is unremarkable in appearance. Vascular/Lymphatic: The abdominal aorta is unremarkable in appearance. The inferior vena cava is grossly unremarkable. Prominent retroperitoneal nodes are seen, measuring up to 1.1 cm in short axis. Prominent nodes are also noted at both sides of the pelvis, measuring up to 1.7 cm in short axis. Enlarged bilateral inguinal nodes are seen, measuring up to 1.6 cm in short axis. Reproductive: The bladder is mildly distended and contains trace contrast. The prostate remains normal in size. Other: Trace ascites is noted about the right side of the abdomen. Diffuse presacral soft tissue inflammation is again noted. There is diffuse soft tissue edema tracking about the anorectal canal, without definite evidence of abscess. Edema is seen tracking along the right side of the gluteal cleft, similar in appearance to the prior study. This may reflect a persistent subcutaneous  abscess, given stability from the prior study, though not well assessed on delayed images. A small drainage catheter is noted posterior to the anorectal canal. Scrotal wall edema is noted, worse on the right, with small bilateral hydroceles. Musculoskeletal: No acute osseous abnormalities are identified. The visualized musculature is unremarkable in appearance. IMPRESSION: 1. Edema tracking along the right side of the gluteal cleft is similar in appearance to the prior study. This reflected a  subcutaneous abscess on the prior study. This most likely reflects a persistent small subcutaneous abscess. 2. Diffuse soft tissue edema tracking about the anorectal canal, without additional abscess. Diffuse presacral soft tissue inflammation noted. 3. Scrotal wall edema, worse on the right, with small bilateral hydroceles. 4. Mild right basilar airspace opacity raises concern for pneumonia. 5. Mild wall thickening along the distal sigmoid colon and rectum, raising concern for proctitis. 6. Enlarged azygoesophageal recess, retroperitoneal, pelvic sidewall and bilateral inguinal nodes seen, measuring up to 1.7 cm in short axis. This likely reflects the underlying infection. 7. Cholelithiasis.  Gallbladder otherwise unremarkable. Electronically Signed   By: Garald Balding M.D.   On: 12/21/2016 01:29   Ct Abdomen Pelvis W Contrast  Result Date: 12/17/2016 CLINICAL DATA:  Known rectal abscesses. Frequent vomiting past 4 days. Hypotension earlier today. HIV positive. EXAM: CT ABDOMEN AND PELVIS WITH CONTRAST TECHNIQUE: Multidetector CT imaging of the abdomen and pelvis was performed using the standard protocol following bolus administration of intravenous contrast. CONTRAST:  <See Chart> ISOVUE-300 IOPAMIDOL (ISOVUE-300) INJECTION 61% COMPARISON:  10/31/2016 FINDINGS: Lower chest: Lung bases are within normal. Hepatobiliary: Single punctate gallstone. Liver and biliary tree are within normal. Pancreas: Within normal. Spleen:  Within normal. Adrenals/Urinary Tract: Adrenal glands are normal. Kidneys are normal in size without hydronephrosis or nephrolithiasis. Ureters and bladder are normal. Stomach/Bowel: Stomach and small bowel are within normal. Appendix is within normal. Colon is unremarkable. Vascular/Lymphatic: Vascular structures are within normal. Mildly prominent inguinal lymph nodes unchanged. Reproductive: Unremarkable. Other: Slight interval decrease in size of a medial right gluteal subcutaneous abscess measuring 1 x 3.3 cm (previously 1.5 x 3.8 cm). Small caliber drain projects very superficial lesion over the lower gluteal crease just above this subcutaneous abscess. Mild wall thickening of the rectum with small round enhancing right perirectal abscess measuring 0.8 x 1.6 cm. Subtle perirectal inflammation. Few small perirectal lymph nodes are present. Slight worsening external iliac chain adenopathy with the largest node over the right iliac chain measuring 1.6 x 3.3 cm. Musculoskeletal: Within normal. IMPRESSION: Persistent rectal wall thickening and perirectal inflammation with new small right perirectal abscess measuring 0.8 x 1.6 cm. Interval decrease in size of a medial right gluteal subcutaneous abscess measuring 1 x 3.3 cm (previously 1.5 x 3.8 cm). Bilateral inguinal and iliac chain/ pelvic adenopathy slightly worse. Findings likely related to patient's ongoing infection described above as well as known HIV. Minimal cholelithiasis unchanged. These results were called by telephone at the time of interpretation on 12/17/2016 at 10:13 pm to Dr. Quintella Reichert , who verbally acknowledged these results. Electronically Signed   By: Marin Olp M.D.   On: 12/17/2016 22:14     ELGERGAWY, DAWOOD M.D on 12/21/2016 at 11:33 AM  Between 7am to 7pm - Pager - [phone]  After 7pm go to [email or web address] - password [facility]  [facility] -  Office  [phone]

## 2016-12-21 NOTE — Progress Notes (Addendum)
Pt is in bed, calm, cooperative. Police officer in the room, pt hand cuffed in bed. NPO maintained, for surgery today. 1200 To pre op, report given.  1520 Received pt back from PACU, A&O, denies pain at this time. Pt had I incontinence of loose stools. Dressing to buttocks changed.  [name] NP was notified of  Positive cryptococcal antigen titer >/= 2560.

## 2016-12-21 NOTE — Discharge Instructions (Signed)
Perirectal Abscess An abscess is an infected area that contains a collection of pus. A perirectal abscess is an abscess that is near the opening of the anus or around the rectum. A perirectal abscess can cause a lot of pain, especially during bowel movements. What are the causes? This condition is almost always caused by an infection that starts in an anal gland. What increases the risk? This condition is more likely to develop in:  People with diabetes or inflammatory bowel disease.  People whose body defense system (immune system) is weak.  People who have anal sex.  People who have a sexually transmitted disease (STD).  People who have certain kinds of cancers, such as rectal carcinoma, leukemia, or lymphoma. What are the signs or symptoms? The main symptom of this condition is pain. The pain may be a throbbing pain that gets worse during bowel movements. Other symptoms include:  Fever.  Swelling.  Redness.  Bleeding.  Constipation. How is this diagnosed? The condition is diagnosed with a physical exam. If the abscess is not visible, a health care provider may need to place a finger inside the rectum to find the abscess. Sometimes, imaging tests are done to determine the size and location of the abscess. These tests may include:  An ultrasound.  An MRI.  A CT scan. How is this treated? This condition is usually treated with incision and drainage surgery. Incision and drainage surgery involves making an incision over the abscess to drain the pus. Treatment may also involve antibiotic medicine, pain medicine, stool softeners, or laxatives. Follow these instructions at home:  Take medicines only as directed by your health care provider.  If you were prescribed an antibiotic, finish all of it even if you start to feel better.  To relieve pain, try sitting:  In a warm, shallow bath (sitz bath).  On a heating pad with the setting on low.  On an inflatable donut-shaped  cushion.  Follow any diet instructions as directed by your health care provider.  Keep all follow-up visits as directed by your health care provider. This is important. Contact a health care provider if:  Your abscess is bleeding.  You have pain, swelling, or redness that is getting worse.  You are constipated.  You feel ill.  You have muscle aches or chills.  You have a fever.  Your symptoms return after the abscess has healed. This information is not intended to replace advice given to you by your health care provider. Make sure you discuss any questions you have with your health care provider. Document Released: 07/15/2000 Document Revised: 12/24/2015 Document Reviewed: 05/28/2014 Elsevier Interactive Patient Education  2017 [employer].   Incision and Drainage, Care After Refer to this sheet in the next few weeks. These instructions provide you with information about caring for yourself after your procedure. Your health care provider may also give you more specific instructions. Your treatment has been planned according to current medical practices, but problems sometimes occur. Call your health care provider if you have any problems or questions after your procedure. What can I expect after the procedure? After the procedure, it is common to have:  Pain or discomfort around your incision site.  Drainage from your incision. Follow these instructions at home:  Take over-the-counter and prescription medicines only as told by your health care provider.  If you were prescribed an antibiotic medicine, take it as told by your health care provider.Do not stop taking the antibiotic even if you start to  feel better.  Followinstructions from your health care provider about:  How to take care of your incision.  When and how you should change your packing and bandage (dressing). Wash your hands with soap and water before you change your dressing. If soap and water are not  available, use hand sanitizer.  When you should remove your dressing.  Do not take baths, swim, or use a hot tub until your health care provider approves.  Keep all follow-up visits as told by your health care provider. This is important.  Check your incision area every day for signs of infection. Check for:  More redness, swelling, or pain.  More fluid or blood.  Warmth.  Pus or a bad smell. Contact a health care provider if:  Your cyst or abscess returns.  You have a fever.  You have more redness, swelling, or pain around your incision.  You have more fluid or blood coming from your incision.  Your incision feels warm to the touch.  You have pus or a bad smell coming from your incision. Get help right away if:  You have severe pain or bleeding.  You cannot eat or drink without vomiting.  You have decreased urine output.  You become short of breath.  You have chest pain.  You cough up blood.  The area where the incision and drainage occurred becomes numb or it tingles. This information is not intended to replace advice given to you by your health care provider. Make sure you discuss any questions you have with your health care provider. Document Released: 10/10/2011 Document Revised: 12/18/2015 Document Reviewed: 05/08/2015 Elsevier Interactive Patient Education  2017 [employer].

## 2016-12-21 NOTE — Progress Notes (Signed)
Pharmacy Antibiotic Note  William Pena is a 31 y.o. male admitted on 12/20/2016 with recurrent perirectal abscesses and possible PNA on CT.  Pharmacy has been consulted for vancomycin dosing.  Also started on cefepime and Flagyl for gram negative and anaerobe coverage.  Plan: Vancomycin 1000mg  x1 in ED then 750mg  IV every 8 hours.  Goal trough 15-20 mcg/mL.  Height: 5\' 6"  (167.6 cm) Weight: 140 lb (63.5 kg) IBW/kg (Calculated) : 63.8  Temp (24hrs), Avg:100.9 F (38.3 C), Min:98.6 F (37 C), Max:103.2 F (39.6 C)   Recent Labs Lab 12/17/16 2001 12/17/16 2017 12/17/16 2033 12/20/16 0647 12/20/16 0704 12/20/16 1947 12/20/16 2149 12/20/16 2201  WBC 6.3  --   --  6.9  --  6.8  --   --   CREATININE 0.88  --  0.80 0.93  --  1.02  --   --   LATICACIDVEN  --  1.27  --   --  1.02  --  2.33* 1.14    Estimated Creatinine Clearance: 94.2 mL/min (by C-G formula based on SCr of 1.02 mg/dL).    Allergies  Allergen Reactions  . Augmentin [Amoxicillin-Pot Clavulanate]     "break out"    Thank you for allowing pharmacy to be a part of this patient's care.  Wynona Neat, PharmD, BCPS  12/21/2016 1:37 AM

## 2016-12-21 NOTE — Care Management Note (Signed)
Case Management Note  Patient Details  Name: William Pena MRN: [id number] Date of Birth: 1986-07-12  Subjective/Objective:                    Action/Plan:  Patient from [facility]. Spoke with Agricultural engineer of Nursing at [facility].   Medical update given verbally and requested medical information faxed to [facility] at [phone].   Expected Discharge Date:                  Expected Discharge Plan:  [facility]  In-House Referral:     Discharge planning Services     Post Acute Care Choice:    Choice offered to:     DME Arranged:    DME Agency:     HH Arranged:    [place] Agency:     Status of Service:  In process, will continue to follow  If discussed at Long Length of Stay Meetings, dates discussed:    Additional Comments:  Marilu Favre, RN 12/21/2016, 10:32 AM

## 2016-12-21 NOTE — Transfer of Care (Signed)
Immediate Anesthesia Transfer of Care Note  Patient: William Pena  Procedure(s) Performed: Procedure(s): IRRIGATION AND DEBRIDEMENT PERIRECTAL ABSCESS (N/A)  Patient Location: PACU  Anesthesia Type:General  Level of Consciousness: drowsy and responds to stimulation  Airway & Oxygen Therapy: Patient Spontanous Breathing and Patient connected to nasal cannula oxygen  Post-op Assessment: Report given to RN and Post -op Vital signs reviewed and stable  Post vital signs: Reviewed and stable  Last Vitals:  Vitals:   12/21/16 0223 12/21/16 0459  BP: 124/71 119/82  Pulse: 65 62  Resp: 18 17  Temp: (!) 38.2 C 37.7 C    Last Pain:  Vitals:   12/21/16 1038  TempSrc:   PainSc: Asleep         Complications: No apparent anesthesia complications

## 2016-12-21 NOTE — Op Note (Signed)
12/21/2016  1:44 PM  PATIENT:  William Pena  31 y.o. male  PRE-OPERATIVE DIAGNOSIS:  PERIRECTAL ABSCESS  POST-OPERATIVE DIAGNOSIS:  PERIRECTAL ABSCESS  PROCEDURE:  Procedure(s):  EXAM UNDER ANESTHESIA, IRRIGATION AND DEBRIDEMENT PERIRECTAL ABSCESS (N/A)  SURGEON:  Surgeon(s) and Role:    * Ralene Ok, MD - Primary  ASSISTANTS: none   ANESTHESIA:   general  EBL: 25cc  Total I/O In: -  Out: 450 [Urine:450]  BLOOD ADMINISTERED:none  DRAINS: 1/4" iodoform guaze   LOCAL MEDICATIONS USED:  NONE  SPECIMEN:  Source of Specimen:  Perianal abscess  DISPOSITION OF SPECIMEN:  micro  COUNTS:  YES  TOURNIQUET:  * No tourniquets in log *  DICTATION: .Dragon Dictation Indications for procedure: Patient is a 31 year old male with a history of recurrent perianal abscesses. Patient has history of HIV. Patient presented with several day history of right gluteal/perianal pain. Patient presented to ER for further evaluation. Patient through a CT scan which revealed some inflammation to the right gluteal area. Patient was consented and taken back to the operating (EUA and I&D of perianal abscess.  Findings: The seton in place that appeared to be superficial to the anal sphincter. This joint to previous. Abscesses. This was superficial enough that this was incised and marsupialized. There was a pocket of purulence into draining sites just lateral to this. These were connected. This went deep however did not communicate with the rectum. There was no fistula visualized.  Details of procedure: After the patient was consented he was taken back to the operating room and placed in the supine position with bilateral wrist in place. He underwent general ventricular anesthesia. He was placed in lithotomy position. A timeout was called and all facts verified. Patient prepped and draped sterile fashion.  At this time the area of the lateral drain site was cannulated with a hemostat. This  appeared to proceed deep and towards the rectum. This was incised. Cultures were obtained. There was some purulent scar from this area. At this time an anoscope was introduced into the rectum. There was some stool in the rectum. At this time a Angiocath and hydrogen peroxide were injected into the exterior drainage site. This was infiltrated. There was no communication seen or tracked internally. There was a second drain site just anterior and lateral to the initial drainage site. These 2 connected. This was incised and the tract was connected. At this time there was irrigated out with sterile saline. The area was then packed with 1/4 inch iodoform gauze.  The previous seton that was in placed with superficial and seemed to be superficial to the internal sphincter. The tract was incised and marsupialized. The seton was removed.  At this time the area was dressed with 4 x 4's, AVD pad, and mesh panties. The patient tied the procedure well. Patient was taken to recovery in stable condition.  PLAN OF CARE: Admit for overnight observation  PATIENT DISPOSITION:  PACU - hemodynamically stable.   Delay start of Pharmacological VTE agent (>24hrs) due to surgical blood loss or risk of bleeding: not applicable

## 2016-12-21 NOTE — Progress Notes (Signed)
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 Please document opening pressure and send CSF Cryptococcal Ag, Toxo PCR, Cell count, Protein, Glucose, Aerobic and Fungal cultures. Discussed with Dr. Baxter Flattery.   Of note his CD4 count today is 21. VL still pending.   [name], MSN, NP-C [facility] for Infectious [place] Cell: [phone] Pager: [phone]  12/21/2016 4:14 PM

## 2016-12-21 NOTE — Consult Note (Signed)
[facility] for Infectious Disease  Total days of antibiotics 0        Day 5/23         Admission Date: 12/20/2016 Consult Date: 12/21/2016       Reason for Consult: HIV disease, perirectal abscess     Referring Physician: Dr. Waldron Labs  Principal Problem:   Perirectal abscess Active Problems:   Chronic pain   AIDS (acquired immunodeficiency syndrome), CD4 <=200 (HCC)   Hyponatremia   Normocytic anemia   Elevated LFTs    HPI: William Pena is a 31 y.o. male with recurrent perirectal abscesses. Most recently was debrided in December 2017 and January 2018 at [facility] by Dr. Ronita Hipps. He presented to ED with temp 103 deg, purulent drainage and severe perirectal pain. Over the last week he has reported intermittent fevers, chills, n/v in addition to the above. Has been incarcerated in [place] jail for the last 6 weeks and has been without his ART/OI prophylaxis and reports performing his dressings himself. CT on 5/19 at [facility] showed small abscess and was recommended to be treated with Augmentin, then Flagyl + Clindamycin, however he was unable to take these as advised. He had blood cultures drawn and has received flagyl, vancomycin, clindamycin and cefepime thus far. Was also started on Azithromycin and Bactrim DS daily for OI prophylaxis. [name] being followed by [facility] Barry Dienes) while he is here for I&D today.   HIV - Diagnosed in 2006; most recently with follow up at [facility] 10/24/16 after 3 years of no care due in part to incarceration. At that time was started on Triumeq. He receives care for his HIV disease at [facility] as well and per chart records was off meds for about 5 years. Of note he cannot be transferred at this time due to incarceration in [place].   08-08-2016 >> VL- 50,936 CD4 - 1  He reports that this will be his third surgery for his recurrent abscesses. Had some period of improvement after treatment with augmentin . Significant tenderness and pain in his neck for several weeks now  with intermittent fevers/chills. Denies headaches, n/v/d.   Past Medical History:  Diagnosis Date  . HIV (human immunodeficiency virus infection) ([place])   . Perirectal abscess     Allergies:  Allergies  Allergen Reactions  . Augmentin [Amoxicillin-Pot Clavulanate]     "break out"    Current antibiotics: Antibiotics Given (last 72 hours)    Date/Time Action Medication Dose Rate   12/21/16 0328 Given   azithromycin (ZITHROMAX) powder 1 g 1 g    12/21/16 0328 New Bag/Given   ceFEPIme (MAXIPIME) 2 g in dextrose 5 % 50 mL IVPB 2 g 100 mL/hr   12/21/16 0329 New Bag/Given   metroNIDAZOLE (FLAGYL) IVPB 500 mg 500 mg 100 mL/hr   12/21/16 0329 New Bag/Given   vancomycin (VANCOCIN) IVPB 1000 mg/200 mL premix 1,000 mg 200 mL/hr   12/21/16 6433 New Bag/Given   ceFEPIme (MAXIPIME) 2 g in dextrose 5 % 50 mL IVPB 2 g 100 mL/hr      MEDICATIONS: . abacavir-dolutegravir-lamiVUDine  1 tablet Oral Daily  . [START ON 12/22/2016] azithromycin  250 mg Oral Daily  . iopamidol      . sulfamethoxazole-trimethoprim  1 tablet Oral Daily    Social History  Substance Use Topics  . Smoking status: Former Smoker    Packs/day: 0.33    Types: Cigarettes  . Smokeless tobacco: Never Used  . Alcohol use No  Family History  Problem Relation Age of Onset  . Diabetes Other     Review of Systems - History obtained from chart review and the patient General ROS: positive for fever, chills ENT ROS: negative for sore throat, dental complaints or oral lesions Respiratory ROS: no cough, shortness of breath, or wheezing Cardiovascular ROS: no chest pain or dyspnea on exertion Gastrointestinal ROS: no abdominal pain, change in bowel habits, or black or bloody stools Genito-Urinary ROS: positive for draining wound around anus Neurological ROS: no TIA or stroke symptoms, negative for headaches Neck/Back: swollen lymph nodes and pain/tenderness to neck    OBJECTIVE: Temp:  [98.6 F (37 C)-103.2 F (39.6  C)] 99.9 F (37.7 C) (05/23 0459) Pulse Rate:  [62-81] 62 (05/23 0459) Resp:  [3-18] 17 (05/23 0459) BP: (102-139)/(61-91) 119/82 (05/23 0459) SpO2:  [92 %-99 %] 98 % (05/23 0459) Weight:  [140 lb (63.5 kg)-141 lb 8 oz (64.2 kg)] 141 lb 8 oz (64.2 kg) (05/23 0223)   General appearance: Sleepy but wakes easily in discussion. Appears to be in pain.  Throat: lips, mucosa, and tongue normal; teeth and gums normal Neck: marked submandibular, anterior, posterior adenopathy Back: symmetric, no curvature. ROM normal. No CVA tenderness., tenderness with chin to chest and rotation of neck Resp: clear to auscultation bilaterally Cardio: regular rate and rhythm and S1, S2 normal GI: soft, non-tender; bowel sounds normal; no masses,  no organomegaly Male genitalia: deferred as he is going to surgery soon and in pain Skin: Skin color, texture, turgor normal. No rashes or lesions or multiple tattoos   LABS: Results for orders placed or performed during the hospital encounter of 12/20/16 (from the past 48 hour(s))  Comprehensive metabolic panel     Status: Abnormal   Collection Time: 12/20/16  7:47 PM  Result Value Ref Range   Sodium 127 (L) 135 - 145 mmol/L   Potassium 5.4 (H) 3.5 - 5.1 mmol/L   Chloride 98 (L) 101 - 111 mmol/L   CO2 24 22 - 32 mmol/L   Glucose, Bld 89 65 - 99 mg/dL   BUN 13 6 - 20 mg/dL   Creatinine, Ser 1.02 0.61 - 1.24 mg/dL   Calcium 7.5 (L) 8.9 - 10.3 mg/dL   Total Protein 8.4 (H) 6.5 - 8.1 g/dL   Albumin 2.6 (L) 3.5 - 5.0 g/dL   AST 139 (H) 15 - 41 U/L   ALT 62 17 - 63 U/L   Alkaline Phosphatase 76 38 - 126 U/L   Total Bilirubin 1.3 (H) 0.3 - 1.2 mg/dL   GFR calc non Af Amer >60 >60 mL/min   GFR calc Af Amer >60 >60 mL/min    Comment: (NOTE) The eGFR has been calculated using the CKD EPI equation. This calculation has not been validated in all clinical situations. eGFR's persistently <60 mL/min signify possible Chronic Kidney Disease.    Anion gap 5 5 - 15  CBC      Status: Abnormal   Collection Time: 12/20/16  7:47 PM  Result Value Ref Range   WBC 6.8 4.0 - 10.5 K/uL   RBC 3.48 (L) 4.22 - 5.81 MIL/uL   Hemoglobin 9.1 (L) 13.0 - 17.0 g/dL   HCT 28.9 (L) 39.0 - 52.0 %   MCV 83.0 78.0 - 100.0 fL   MCH 26.1 26.0 - 34.0 pg   MCHC 31.5 30.0 - 36.0 g/dL   RDW 14.8 11.5 - 15.5 %   Platelets 110 (L) 150 - 400 K/uL  Comment: REPEATED TO VERIFY SPECIMEN CHECKED FOR CLOTS PLATELETS APPEAR DECREASED PLATELET COUNT CONFIRMED BY SMEAR   I-Stat CG4 Lactic Acid, ED     Status: Abnormal   Collection Time: 12/20/16  9:49 PM  Result Value Ref Range   Lactic Acid, Venous 2.33 (HH) 0.5 - 1.9 mmol/L   Comment NOTIFIED PHYSICIAN   I-Stat CG4 Lactic Acid, ED     Status: None   Collection Time: 12/20/16 10:01 PM  Result Value Ref Range   Lactic Acid, Venous 1.14 0.5 - 1.9 mmol/L  Surgical pcr screen     Status: None   Collection Time: 12/21/16  2:25 AM  Result Value Ref Range   MRSA, PCR NEGATIVE NEGATIVE   Staphylococcus aureus NEGATIVE NEGATIVE    Comment:        The Xpert SA Assay (FDA approved for NASAL specimens in patients over 37 years of age), is one component of a comprehensive surveillance program.  Test performance has been validated by [facility] for patients greater than or equal to 60 year old. It is not intended to diagnose infection nor to guide or monitor treatment.   Osmolality     Status: Abnormal   Collection Time: 12/21/16  4:11 AM  Result Value Ref Range   Osmolality 270 (L) 275 - 295 mOsm/kg    MICRO:   IMAGING: Dg Chest 2 View  Result Date: 12/20/2016 CLINICAL DATA:  Abdominal pain with nausea and vomiting and shortness of breath EXAM: CHEST  2 VIEW COMPARISON:  None. FINDINGS: The heart size and mediastinal contours are within normal limits. Both lungs are clear. The visualized skeletal structures are unremarkable. IMPRESSION: No active cardiopulmonary disease. Electronically Signed   By: Inez Catalina M.D.   On:  12/20/2016 07:48   Ct Abdomen Pelvis W Contrast  Result Date: 12/21/2016 CLINICAL DATA:  Known rectal abscesses, status post incision and drainage. Personal history of HIV. Initial encounter. EXAM: CT ABDOMEN AND PELVIS WITH CONTRAST TECHNIQUE: Multidetector CT imaging of the abdomen and pelvis was performed using the standard protocol following bolus administration of intravenous contrast. CONTRAST:  191m ISOVUE-300 IOPAMIDOL (ISOVUE-300) INJECTION 61% COMPARISON:  CT of the abdomen and pelvis performed 12/17/2016 FINDINGS: Lower chest: Mild right basilar airspace opacity raises concern for pneumonia. A 1.6 cm azygoesophageal recess node is seen. Hepatobiliary: The liver is unremarkable in appearance. A stone is noted dependently within the gallbladder. The gallbladder is otherwise unremarkable. The common bile duct remains normal in caliber. Pancreas: The pancreas is within normal limits. Spleen: The spleen is unremarkable in appearance. Adrenals/Urinary Tract: The adrenal glands are unremarkable in appearance. The kidneys are within normal limits. There is no evidence of hydronephrosis. No renal or ureteral stones are identified. No perinephric stranding is seen. Stomach/Bowel: The stomach is unremarkable in appearance. The small bowel is within normal limits. The patient is status post appendectomy. Mild wall thickening is noted along the distal sigmoid colon and rectum, raising concern for proctitis. The remainder of the colon is filled with contrast and is unremarkable in appearance. Vascular/Lymphatic: The abdominal aorta is unremarkable in appearance. The inferior vena cava is grossly unremarkable. Prominent retroperitoneal nodes are seen, measuring up to 1.1 cm in short axis. Prominent nodes are also noted at both sides of the pelvis, measuring up to 1.7 cm in short axis. Enlarged bilateral inguinal nodes are seen, measuring up to 1.6 cm in short axis. Reproductive: The bladder is mildly distended and  contains trace contrast. The prostate remains normal in size.  Other: Trace ascites is noted about the right side of the abdomen. Diffuse presacral soft tissue inflammation is again noted. There is diffuse soft tissue edema tracking about the anorectal canal, without definite evidence of abscess. Edema is seen tracking along the right side of the gluteal cleft, similar in appearance to the prior study. This may reflect a persistent subcutaneous abscess, given stability from the prior study, though not well assessed on delayed images. A small drainage catheter is noted posterior to the anorectal canal. Scrotal wall edema is noted, worse on the right, with small bilateral hydroceles. Musculoskeletal: No acute osseous abnormalities are identified. The visualized musculature is unremarkable in appearance. IMPRESSION: 1. Edema tracking along the right side of the gluteal cleft is similar in appearance to the prior study. This reflected a subcutaneous abscess on the prior study. This most likely reflects a persistent small subcutaneous abscess. 2. Diffuse soft tissue edema tracking about the anorectal canal, without additional abscess. Diffuse presacral soft tissue inflammation noted. 3. Scrotal wall edema, worse on the right, with small bilateral hydroceles. 4. Mild right basilar airspace opacity raises concern for pneumonia. 5. Mild wall thickening along the distal sigmoid colon and rectum, raising concern for proctitis. 6. Enlarged azygoesophageal recess, retroperitoneal, pelvic sidewall and bilateral inguinal nodes seen, measuring up to 1.7 cm in short axis. This likely reflects the underlying infection. 7. Cholelithiasis.  Gallbladder otherwise unremarkable. Electronically Signed   By: Garald Balding M.D.   On: 12/21/2016 01:29    HISTORICAL MICRO/IMAGING  Assessment/Plan:  31 yo male with recurrent draining perirectal abscesses in the setting of AIDS.   1. Perirectal Abscess = -planning I&D this afternoon    -presumably polymicrobial considering location of wound. Will narrow antibiotics a bit. Keep Flagyl and Vancomycin for now until we have cultures. Start ceftriaxone.   2. AIDS =  -HLA B 5701 neg 08/09/16. Continue Triumeq for now -Positive for GC/C 08/03/2016 (no documentation of treatment) - will recheck urine and oral swab (don't want to perform rectal with current draining abscesses/pain) -Check RPR today -[name] at significant risk for acquired OI's including PCP, cryptococcal meningitis and disseminated MAC with CD4 count of 1, recent incarceration and no prophylaxis.  -Will check head CT today after surgery as he will need LP with his nuchal rigidity, fevers and lymphadenopathy in setting of AIDS.  -Check acid fast blood culture   3. Pneumonia =  -CT of a/p revealed opacity in RLL concerning for pneumonia -Will check Chest CT to get a better image of RLL opacity and lymphadenopathy. May need to consider needle biopsy to assess for MAC.   [name], MSN, NP-C [facility] for Infectious [place] Cell: [phone] Pager: [phone]  12/21/2016  1:06 PM

## 2016-12-22 ENCOUNTER — Encounter (HOSPITAL_COMMUNITY)

## 2016-12-22 ENCOUNTER — Encounter (HOSPITAL_COMMUNITY)
Admission: EM | Disposition: A | Discharge status: Skilled Nursing Facility | Payer: Self-pay | Source: Home / Self Care | Attending: Emergency Medicine

## 2016-12-22 ENCOUNTER — Encounter (HOSPITAL_COMMUNITY): Payer: Self-pay | Admitting: General Surgery

## 2016-12-22 DIAGNOSIS — R918 Other nonspecific abnormal finding of lung field: Secondary | ICD-10-CM

## 2016-12-22 LAB — CERVICOVAGINAL ANCILLARY ONLY
Chlamydia: NEGATIVE
NEISSERIA GONORRHEA: NEGATIVE

## 2016-12-22 LAB — CSF CELL COUNT WITH DIFFERENTIAL
RBC COUNT CSF: 106 /mm3 — AB
TUBE #: 4
WBC, CSF: 4 /mm3 (ref 0–5)

## 2016-12-22 LAB — COMPREHENSIVE METABOLIC PANEL
ALBUMIN: 2.2 g/dL — AB (ref 3.5–5.0)
ALT: 89 U/L — ABNORMAL HIGH (ref 17–63)
AST: 154 U/L — ABNORMAL HIGH (ref 15–41)
Alkaline Phosphatase: 82 U/L (ref 38–126)
Anion gap: 6 (ref 5–15)
BILIRUBIN TOTAL: 0.8 mg/dL (ref 0.3–1.2)
BUN: 7 mg/dL (ref 6–20)
CO2: 24 mmol/L (ref 22–32)
Calcium: 7.3 mg/dL — ABNORMAL LOW (ref 8.9–10.3)
Chloride: 95 mmol/L — ABNORMAL LOW (ref 101–111)
Creatinine, Ser: 0.79 mg/dL (ref 0.61–1.24)
Glucose, Bld: 107 mg/dL — ABNORMAL HIGH (ref 65–99)
POTASSIUM: 3.4 mmol/L — AB (ref 3.5–5.1)
Sodium: 125 mmol/L — ABNORMAL LOW (ref 135–145)
TOTAL PROTEIN: 7.7 g/dL (ref 6.5–8.1)

## 2016-12-22 LAB — CBC
HEMATOCRIT: 26.6 % — AB (ref 39.0–52.0)
HEMOGLOBIN: 8.3 g/dL — AB (ref 13.0–17.0)
MCH: 25.9 pg — ABNORMAL LOW (ref 26.0–34.0)
MCHC: 31.2 g/dL (ref 30.0–36.0)
MCV: 82.9 fL (ref 78.0–100.0)
Platelets: 95 10*3/uL — ABNORMAL LOW (ref 150–400)
RBC: 3.21 MIL/uL — AB (ref 4.22–5.81)
RDW: 14.7 % (ref 11.5–15.5)
WBC: 9.3 10*3/uL (ref 4.0–10.5)

## 2016-12-22 LAB — OSMOLALITY, URINE: Osmolality, Ur: 531 mOsm/kg (ref 300–900)

## 2016-12-22 LAB — APTT: aPTT: 28 seconds (ref 24–36)

## 2016-12-22 LAB — OSMOLALITY: Osmolality: 269 mOsm/kg — ABNORMAL LOW (ref 275–295)

## 2016-12-22 LAB — PROTEIN AND GLUCOSE, CSF
GLUCOSE CSF: 44 mg/dL (ref 40–70)
Total  Protein, CSF: 72 mg/dL — ABNORMAL HIGH (ref 15–45)

## 2016-12-22 LAB — CYTOLOGY, (ORAL, ANAL, URETHRAL) ANCILLARY ONLY
Chlamydia: NEGATIVE
NEISSERIA GONORRHEA: NEGATIVE

## 2016-12-22 LAB — CRYPTOCOCCAL ANTIGEN, CSF: Crypto Ag: POSITIVE — AB

## 2016-12-22 LAB — SODIUM, URINE, RANDOM: Sodium, Ur: 165 mmol/L

## 2016-12-22 LAB — RPR: RPR: NONREACTIVE

## 2016-12-22 LAB — LACTIC ACID, PLASMA: Lactic Acid, Venous: 1.5 mmol/L (ref 0.5–1.9)

## 2016-12-22 LAB — PROTIME-INR
INR: 1.2
Prothrombin Time: 15.2 seconds (ref 11.4–15.2)

## 2016-12-22 LAB — HIV-1 RNA QUANT-NO REFLEX-BLD
HIV 1 RNA Quant: 1550000 copies/mL
LOG10 HIV-1 RNA: 6.19 {Log_copies}/mL

## 2016-12-22 SURGERY — VIDEO BRONCHOSCOPY WITHOUT FLUORO
Anesthesia: Moderate Sedation | Laterality: Bilateral

## 2016-12-22 MED ORDER — FLUCYTOSINE 250 MG PO CAPS
1500.0000 mg | ORAL_CAPSULE | Freq: Four times a day (QID) | ORAL | Status: DC
  Administered 2016-12-22 – 2016-12-26 (×17): 1500 mg via ORAL
  Filled 2016-12-22: qty 3
  Filled 2016-12-22 (×16): qty 6
  Filled 2016-12-22: qty 3
  Filled 2016-12-22 (×6): qty 6

## 2016-12-22 MED ORDER — POTASSIUM CHLORIDE CRYS ER 20 MEQ PO TBCR
40.0000 meq | EXTENDED_RELEASE_TABLET | Freq: Once | ORAL | Status: AC
  Administered 2016-12-22: 40 meq via ORAL
  Filled 2016-12-22: qty 2

## 2016-12-22 MED ORDER — DOXYCYCLINE HYCLATE 100 MG PO TABS
100.0000 mg | ORAL_TABLET | Freq: Two times a day (BID) | ORAL | Status: DC
  Administered 2016-12-22: 100 mg via ORAL
  Filled 2016-12-22: qty 1

## 2016-12-22 MED ORDER — SODIUM CHLORIDE 0.9 % IV BOLUS (SEPSIS)
500.0000 mL | INTRAVENOUS | Status: DC
  Administered 2016-12-23 – 2016-12-26 (×3): 500 mL via INTRAVENOUS

## 2016-12-22 MED ORDER — SODIUM CHLORIDE 0.9 % IV BOLUS (SEPSIS)
500.0000 mL | Freq: Once | INTRAVENOUS | Status: AC
  Administered 2016-12-22: 500 mL via INTRAVENOUS

## 2016-12-22 MED ORDER — MIDAZOLAM HCL 5 MG/ML IJ SOLN
INTRAMUSCULAR | Status: AC
  Filled 2016-12-22: qty 2

## 2016-12-22 MED ORDER — ACETAMINOPHEN 650 MG RE SUPP: 650.0000 mg | Freq: Four times a day (QID) | RECTAL | Status: DC

## 2016-12-22 MED ORDER — AMOXICILLIN-POT CLAVULANATE 875-125 MG PO TABS
1.0000 | ORAL_TABLET | Freq: Two times a day (BID) | ORAL | Status: DC
  Administered 2016-12-22 – 2016-12-23 (×3): 1 via ORAL
  Filled 2016-12-22 (×4): qty 1

## 2016-12-22 MED ORDER — MAGIC MOUTHWASH
5.0000 mL | Freq: Four times a day (QID) | ORAL | Status: DC
  Administered 2016-12-22 – 2016-12-26 (×13): 5 mL via ORAL
  Filled 2016-12-22 (×18): qty 5

## 2016-12-22 MED ORDER — ACETAMINOPHEN 325 MG PO TABS: 650.0000 mg | ORAL_TABLET | Freq: Four times a day (QID) | ORAL | Status: DC

## 2016-12-22 MED ORDER — SODIUM CHLORIDE 0.9 % IV BOLUS (SEPSIS)
500.0000 mL | INTRAVENOUS | Status: DC
  Administered 2016-12-23 – 2016-12-26 (×4): 500 mL via INTRAVENOUS

## 2016-12-22 MED ORDER — LIDOCAINE HCL (PF) 1 % IJ SOLN
5.0000 mL | Freq: Once | INTRAMUSCULAR | Status: DC
  Filled 2016-12-22 (×3): qty 5

## 2016-12-22 MED ORDER — DIPHENHYDRAMINE HCL 50 MG/ML IJ SOLN
25.0000 mg | Freq: Once | INTRAMUSCULAR | Status: AC
Start: 2016-12-22 — End: 2016-12-22
  Administered 2016-12-22: 25 mg via INTRAVENOUS
  Filled 2016-12-22: qty 1

## 2016-12-22 MED ORDER — VALACYCLOVIR HCL 500 MG PO TABS
1000.0000 mg | ORAL_TABLET | Freq: Two times a day (BID) | ORAL | Status: DC
  Administered 2016-12-22 – 2016-12-26 (×8): 1000 mg via ORAL
  Filled 2016-12-22 (×9): qty 2

## 2016-12-22 MED ORDER — HYDROMORPHONE HCL 1 MG/ML IJ SOLN
1.0000 mg | INTRAMUSCULAR | Status: DC | PRN
  Administered 2016-12-22 – 2016-12-23 (×5): 1 mg via INTRAVENOUS
  Filled 2016-12-22 (×5): qty 1

## 2016-12-22 MED ORDER — OXYCODONE HCL 5 MG PO TABS
5.0000 mg | ORAL_TABLET | ORAL | Status: DC | PRN
  Administered 2016-12-22 (×2): 10 mg via ORAL
  Administered 2016-12-23 (×2): 5 mg via ORAL
  Administered 2016-12-24: 10 mg via ORAL
  Administered 2016-12-24: 5 mg via ORAL
  Administered 2016-12-24 – 2016-12-25 (×3): 10 mg via ORAL
  Filled 2016-12-22 (×2): qty 2
  Filled 2016-12-22: qty 1
  Filled 2016-12-22: qty 2
  Filled 2016-12-22: qty 1
  Filled 2016-12-22 (×3): qty 2
  Filled 2016-12-22: qty 1

## 2016-12-22 MED ORDER — AZITHROMYCIN 600 MG PO TABS
1200.0000 mg | ORAL_TABLET | ORAL | Status: DC
  Administered 2016-12-23: 1200 mg via ORAL
  Filled 2016-12-22: qty 2

## 2016-12-22 MED ORDER — FENTANYL CITRATE (PF) 100 MCG/2ML IJ SOLN
INTRAMUSCULAR | Status: AC
  Filled 2016-12-22: qty 4

## 2016-12-22 MED ORDER — IBUPROFEN 200 MG PO TABS
600.0000 mg | ORAL_TABLET | Freq: Three times a day (TID) | ORAL | Status: DC
  Administered 2016-12-22 – 2016-12-24 (×7): 600 mg via ORAL
  Filled 2016-12-22 (×2): qty 1
  Filled 2016-12-22: qty 3
  Filled 2016-12-22 (×2): qty 1
  Filled 2016-12-22 (×2): qty 3

## 2016-12-22 MED ORDER — OXYCODONE HCL 5 MG PO TABS: 15.0000 mg | ORAL_TABLET | ORAL | Status: DC | PRN

## 2016-12-22 MED ORDER — DEXTROSE 5 % IV SOLN
200.0000 mg | INTRAVENOUS | Status: DC
  Administered 2016-12-23 – 2016-12-27 (×5): 200 mg via INTRAVENOUS
  Filled 2016-12-22 (×9): qty 200

## 2016-12-22 MED ORDER — DOXYCYCLINE HYCLATE 100 MG PO TABS
100.0000 mg | ORAL_TABLET | Freq: Two times a day (BID) | ORAL | Status: DC
  Administered 2016-12-22 – 2016-12-23 (×3): 100 mg via ORAL
  Filled 2016-12-22 (×3): qty 1

## 2016-12-22 MED ORDER — ACETAMINOPHEN 650 MG RE SUPP: 650.0000 mg | Freq: Four times a day (QID) | RECTAL | Status: DC | PRN

## 2016-12-22 MED ORDER — ACETAMINOPHEN 325 MG PO TABS
650.0000 mg | ORAL_TABLET | Freq: Four times a day (QID) | ORAL | Status: DC | PRN
  Administered 2016-12-22 – 2016-12-23 (×3): 650 mg via ORAL
  Filled 2016-12-22 (×3): qty 2

## 2016-12-22 NOTE — Progress Notes (Signed)
MD paged due to Cryptococcal Ag Titer resulting positive.

## 2016-12-22 NOTE — Consult Note (Signed)
Name: William Pena MRN: [id number] DOB: 07/09/1986    ADMISSION DATE:  12/20/2016 CONSULTATION DATE:  5/24 REFERRING MD :  Baxter Flattery   CHIEF COMPLAINT:  Request for BAL in setting of cavitary pulmonary lesions   BRIEF PATIENT DESCRIPTION:  31 year old male w/ HIV/AIDS. Admitted for perirectal abscess on 5/22 but also noted to have fever, night sweats, stiff neck. ID consulted and CT showed cavitary lesions in RUL, serum cryptococcal antigenemia titer was elevated. Pulm asked to see and perform FOB w/ washing to help determine  if CT changes reflect mTB OR possibly pulmonary cryptococcal disease.  SIGNIFICANT EVENTS    STUDIES:  CT head 5/24: nml CT chest 5/24: Bilateral airspace disease with a small cavitary lesion in the right upper lobe. Findings compatible with pneumonia. Trace right pleural fluid versus right pleural thickening. CT abd/pelvis 5/23: 1. Edema tracking along the right side of the gluteal cleft is similar in appearance to the prior study. This reflected a subcutaneous abscess on the prior study. This most likely reflects a persistent small subcutaneous abscess. 2. Diffuse soft tissue edema tracking about the anorectal canal, without additional abscess. Diffuse presacral soft tissue inflammation noted. 3. Scrotal wall edema, worse on the right, with small bilateral Hydroceles. 4. Mild right basilar airspace opacity raises concern for pneumonia. 5. Mild wall thickening along the distal sigmoid colon and rectum, raising concern for proctitis. 6. Enlarged azygoesophageal recess, retroperitoneal, pelvic sidewall and bilateral inguinal nodes seen, measuring up to 1.7 cm in short axis. This likely reflects the underlying infection. 7. Cholelithiasis.  Gallbladder otherwise unremarkable.  Culture data 5/23 BCX2>>> AFB 5/23>> Surgical wound culture 5/23: few wbc, few gm variable rods>>> Acid fast culture w/ reflexed sensitivities from sputum 5/23>>> afb from sputum  5/23>>>  ABX azith 5/22>>> Cefepime 5/22>>>5/23 Ceftriaxone 5/23>>> Flagyl 5/23>>> vanc 5/23>>> Amphotericin B 5/24>>> Flucytosine 5/24>>>  HISTORY OF PRESENT ILLNESS:   This is a 31 year old make w/ HIV/AIDS (last CD4 1 w/ VL 81K). Just started back on ART 2 months ago however he was incarcerated and reported he was not given his medications. He was admitted on 5/22 for perirectal abscess. During admission he was also noted to have HA, fever, chills, night sweats neck pain and LAN. ID was consulted and recommended imaging of chest, LP, and cultures be sent from perirectal I&D. Since that time CT chest results: "Bilateral airspace disease with a small cavitary lesion in the right upper lobe. Findings compatible with pneumonia.Trace right pleural fluid versus right pleural thickening". Also his serum cryptococcal antigenemia titer was elevated raising the possibility of disseminated cryptococcal infection. Including lung involvement. We have been asked to see by infectious disease for bronchoscopy and washings in effort to help determine if CT changes reflect mTB OR possibly pulmonary cryptococcal disease.   PAST MEDICAL HISTORY :   has a past medical history of HIV (human immunodeficiency virus infection) (William Pena) and Perirectal abscess.  has a past surgical history that includes Incision and drainage perirectal abscess and Incision and drainage perirectal abscess (N/A, 12/21/2016). Prior to Admission medications   Medication Sig Start Date End Date Taking? Authorizing Provider  acetaminophen (TYLENOL) 325 MG tablet Take 975 mg by mouth 3 (three) times daily as needed for moderate pain.    Yes [provider]  docusate sodium (COLACE) 100 MG capsule Take 100 mg by mouth 2 (two) times daily.   Yes [provider]  ondansetron (ZOFRAN ODT) 4 MG disintegrating tablet Take 1 tablet (4 mg  total) by mouth every 8 (eight) hours as needed for nausea or vomiting. 12/18/16  Yes Quintella Reichert, MD  sulfamethoxazole-trimethoprim (BACTRIM DS,SEPTRA DS) 800-160 MG tablet Take 1 tablet by mouth daily. 12/10/16 01/10/17 Yes [provider]  ibuprofen (ADVIL,MOTRIN) 800 MG tablet Take 1 tablet (800 mg total) by mouth every 8 (eight) hours as needed. Patient not taking: Reported on 12/17/2016 12/15/16   Long, Wonda Olds, MD   Allergies  Allergen Reactions  . Augmentin [Amoxicillin-Pot Clavulanate]     "break out"    FAMILY HISTORY:  family history includes Diabetes in his other. SOCIAL HISTORY:  reports that he has quit smoking. His smoking use included Cigarettes. He smoked 0.33 packs per day. He has never used smokeless tobacco. He reports that he does not drink alcohol or use drugs.  REVIEW OF SYSTEMS:   Constitutional: Negative for fever, chills, weight loss, malaise/fatigue and diaphoresis.  HENT: Negative for hearing loss, ear pain, nosebleeds, congestion, sore throat, neck pain, tinnitus and ear discharge.   Eyes: Negative for blurred vision, double vision, photophobia, pain, discharge and redness.  Respiratory: Negative for cough, hemoptysis, sputum production, shortness of breath, wheezing and stridor.   Cardiovascular: Negative for chest pain, palpitations, orthopnea, claudication, leg swelling and PND.  Gastrointestinal: Negative for heartburn, nausea, vomiting, abdominal pain, diarrhea, constipation, blood in stool and melena.  Genitourinary: Negative for dysuria, urgency, frequency, hematuria and flank pain.  Musculoskeletal: Negative for myalgias, back pain, joint pain and falls.  Skin: Negative for itching and rash.  Neurological: Negative for dizziness, tingling, tremors, sensory change, speech change, focal weakness, seizures, loss of consciousness, weakness and headaches.  Endo/Heme/Allergies: Negative for environmental allergies and polydipsia. Does not bruise/bleed easily.    VITAL SIGNS: Temp:  [97.7 F (36.5 C)-98.9 F (37.2 C)] 98.8 F (37.1  C) (05/24 0547) Pulse Rate:  [61-88] 62 (05/24 0547) Resp:  [8-19] 19 (05/24 0547) BP: (109-161)/(69-113) 137/87 (05/24 0547) SpO2:  [87 %-100 %] 99 % (05/24 0547)  PHYSICAL EXAMINATION: General:  Muscular male lying in bed Neuro:  alet and oriened x 3 but somewhat slow to respond  Psych: stoic HEENT:  No neck nodes Cardiovascular:  He refused Lungs:  No distress but refused ausculatation Abdomen:  Looks normal refused palpation Musculoskeletal:  No cyanosis no clubbing no edeam Skin:  Significant gang type tatoos in face and upper chest including derogatory terms about police  CT CHEST - personally visualised    Recent Labs Lab 12/20/16 1947 12/21/16 0825 12/22/16 0658  NA 127* 126* 125*  K 5.4* 3.8 3.4*  CL 98* 98* 95*  CO2 _0 BUN _1 CREATININE 1.02 0.91 0.79  GLUCOSE 89 85 107*    Recent Labs Lab 12/20/16 1947 12/21/16 0825 12/22/16 0658  HGB 9.1* 8.9* 8.3*  HCT 28.9* 28.8* 26.6*  WBC 6.8 6.9 9.3  PLT 110* 111* 95*   Ct Head Wo Contrast  Result Date: 12/21/2016 CLINICAL DATA:  HIV. Concern for disseminated infection or meningitis. Lymphadenopathy. Nuchal rigidity. EXAM: CT HEAD WITHOUT CONTRAST TECHNIQUE: Contiguous axial images were obtained from the base of the skull through the vertex without intravenous contrast. COMPARISON:  Head CT 12/17/2016 FINDINGS: Brain: No mass lesion, intraparenchymal hemorrhage or extra-axial collection. No evidence of acute cortical infarct. Brain parenchyma and CSF-containing spaces are normal for age. Vascular: No hyperdense vessel or unexpected calcification. Skull: Normal visualized skull base, calvarium and extracranial soft tissues. Sinuses/Orbits: No sinus fluid levels or advanced mucosal thickening. No  mastoid effusion. Normal orbits. IMPRESSION: Normal head CT. Electronically Signed   By: Ulyses Jarred M.D.   On: 12/21/2016 19:06   Ct Chest Wo Contrast  Result Date: 12/21/2016 CLINICAL DATA:  Evaluate for  lymph node enlargement. 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 31 year old with HIV. EXAM: CT CHEST WITHOUT CONTRAST TECHNIQUE: Multidetector CT imaging of the chest was performed following the standard protocol without IV contrast. COMPARISON:  Chest radiograph 12/20/2016 FINDINGS: Cardiovascular: Normal caliber of the thoracic aorta. No gross abnormality to the pulmonary arteries. Limited evaluation of the vascular structures without intravenous contrast. Mediastinum/Nodes: Limited evaluation for mediastinal and hilar lymphadenopathy on this noncontrast examination. There appears to be subcarinal fullness measuring up to 1.8 cm which probably represents enlarged lymph nodes. Soft tissue fullness in the right hilum on sequence 4, image 75 measures 1.6 cm in the short axis. Multiple small lymph nodes scattered throughout the bilateral axillary regions. There appears to be multiple lymph nodes in the paratracheal region. There is low density in the anterior mediastinum which is nonspecific but could be associated with thymus. Cannot exclude right supraclavicular lymphadenopathy. Lungs/Pleura: There is trace right pleural fluid versus right pleural thickening. Trachea and mainstem bronchi are patent. Patchy areas of airspace disease in both lower lobes and both upper lobes. Small cavitary lesion in the right upper lobe measuring 6 mm on sequence 6, image 47. Scattered areas of interstitial thickening in the right lung. Upper Abdomen: Images of the upper abdomen are unremarkable. Musculoskeletal: No acute bone abnormality. IMPRESSION: Bilateral airspace disease with a small cavitary lesion in the right upper lobe. Findings compatible with pneumonia. Trace right pleural fluid versus right pleural thickening. Mediastinal and hilar lymphadenopathy. Evaluation of the lymphadenopathy is limited on this noncontrast examination. There are also prominent bilateral axillary lymph nodes. Electronically Signed   By: Markus Daft M.D.   On:  12/21/2016 21:15   Ct Abdomen Pelvis W Contrast  Result Date: 12/21/2016 CLINICAL DATA:  Known rectal abscesses, status post incision and drainage. Personal history of HIV. Initial encounter. EXAM: CT ABDOMEN AND PELVIS WITH CONTRAST TECHNIQUE: Multidetector CT imaging of the abdomen and pelvis was performed using the standard protocol following bolus administration of intravenous contrast. CONTRAST:  164m ISOVUE-300 IOPAMIDOL (ISOVUE-300) INJECTION 61% COMPARISON:  CT of the abdomen and pelvis performed 12/17/2016 FINDINGS: Lower chest: Mild right basilar airspace opacity raises concern for pneumonia. A 1.6 cm azygoesophageal recess node is seen. Hepatobiliary: The liver is unremarkable in appearance. A stone is noted dependently within the gallbladder. The gallbladder is otherwise unremarkable. The common bile duct remains normal in caliber. Pancreas: The pancreas is within normal limits. Spleen: The spleen is unremarkable in appearance. Adrenals/Urinary Tract: The adrenal glands are unremarkable in appearance. The kidneys are within normal limits. There is no evidence of hydronephrosis. No renal or ureteral stones are identified. No perinephric stranding is seen. Stomach/Bowel: The stomach is unremarkable in appearance. The small bowel is within normal limits. The patient is status post appendectomy. Mild wall thickening is noted along the distal sigmoid colon and rectum, raising concern for proctitis. The remainder of the colon is filled with contrast and is unremarkable in appearance. Vascular/Lymphatic: The abdominal aorta is unremarkable in appearance. The inferior vena cava is grossly unremarkable. Prominent retroperitoneal nodes are seen, measuring up to 1.1 cm in short axis. Prominent nodes are also noted at both sides of the pelvis, measuring up to 1.7 cm in short axis. Enlarged bilateral inguinal nodes are seen, measuring up to 1.6 cm in short  axis. Reproductive: The bladder is mildly distended and  contains trace contrast. The prostate remains normal in size. Other: Trace ascites is noted about the right side of the abdomen. Diffuse presacral soft tissue inflammation is again noted. There is diffuse soft tissue edema tracking about the anorectal canal, without definite evidence of abscess. Edema is seen tracking along the right side of the gluteal cleft, similar in appearance to the prior study. This may reflect a persistent subcutaneous abscess, given stability from the prior study, though not well assessed on delayed images. A small drainage catheter is noted posterior to the anorectal canal. Scrotal wall edema is noted, worse on the right, with small bilateral hydroceles. Musculoskeletal: No acute osseous abnormalities are identified. The visualized musculature is unremarkable in appearance. IMPRESSION: 1. Edema tracking along the right side of the gluteal cleft is similar in appearance to the prior study. This reflected a subcutaneous abscess on the prior study. This most likely reflects a persistent small subcutaneous abscess. 2. Diffuse soft tissue edema tracking about the anorectal canal, without additional abscess. Diffuse presacral soft tissue inflammation noted. 3. Scrotal wall edema, worse on the right, with small bilateral hydroceles. 4. Mild right basilar airspace opacity raises concern for pneumonia. 5. Mild wall thickening along the distal sigmoid colon and rectum, raising concern for proctitis. 6. Enlarged azygoesophageal recess, retroperitoneal, pelvic sidewall and bilateral inguinal nodes seen, measuring up to 1.7 cm in short axis. This likely reflects the underlying infection. 7. Cholelithiasis.  Gallbladder otherwise unremarkable. Electronically Signed   By: Garald Balding M.D.   On: 12/21/2016 01:29    ASSESSMENT / PLAN:  Advanced HIV/AIDS Perirectal abscess Cavitary RUL lesion w/ bilateral pulmonary infiltrates.  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  R/o mTB vs pulmonary  cryptococcal disease.   Discussion Agree he is at high risk for opportunistic infection  Plan For FOB see Dr Golden Pop attestation above   Risks of pneumothorax, hemothorax, sedation/anesthesia complications such as cardiac or respiratory arrest or hypotension, stroke and bleeding all explained. Benefits of diagnosis but limitations of non-diagnosis also explained. Patient verbalized understanding and wished to proceed.     Dr. Brand Males, M.D., [place].C.P Pulmonary and Critical Care Medicine Staff Physician [facility] Pager: [phone], If no answer or between  15:00h - 7:00h: call [phone]  12/22/2016 11:23 AM

## 2016-12-22 NOTE — Progress Notes (Signed)
[facility]    Date of Admission:  12/20/2016   Total days of antibiotics 2        Day 2 Metronidazole, ceftriaxone (5/23)        Day 2 Ampho + flucytosine (5/23)        Bactrim/azithro prophylaxis   ID: William Pena is a 31 y.o. male with AIDS that presented to the hospital for recurrent perirectal abscess drainage. Also found to have fevers, lymphadenopathy, nuchal rigidity,   Principal Problem:   Perirectal abscess Active Problems:   Chronic pain   AIDS (acquired immunodeficiency syndrome), CD4 <=200 (HCC)   Hyponatremia   Normocytic anemia   Elevated LFTs   Subjective: Feeling a little better today. Pain in neck is still present.   [facility] to discuss ART adherence; report he has not been compliant with medications. When asking him why he was not taking the medications while incarcerated he did not answer me.   Medications:  . [START ON 12/23/2016] azithromycin  1,200 mg Oral Weekly  . doxycycline  100 mg Oral Q12H  . flucytosine  1,500 mg Oral Q6H  . ibuprofen  600 mg Oral TID  . sulfamethoxazole-trimethoprim  1 tablet Oral Daily    Objective: Vital signs in last 24 hours: Temp:  [97.7 F (36.5 C)-98.9 F (37.2 C)] 98.5 F (36.9 C) (05/24 1032) Pulse Rate:  [61-88] 62 (05/24 1032) Resp:  [8-19] 19 (05/24 1032) BP: (109-161)/(69-113) 147/88 (05/24 1032) SpO2:  [87 %-100 %] 100 % (05/24 1032)   General appearance: Cooperative but falls asleep easiy. Tearful during conversation at times.  Throat: Oral thrush noted Neck: marked anterior cervical adenopathy Resp: Diminished but clear, no cough observed  Chest wall: Right clavicular lymph node palpable - non-tender Cardio: normal apical impulse, S1, S2 normal and no murmur, rub or gallop Extremities: extremities normal, atraumatic, no cyanosis or edema and multiple tattoos Pulses: 2+ and symmetric Lymph nodes: Cervical adenopathy: enlarged posterior and anterior LNs,  enlarged submandibular LNs, Axillary adenopathy: bilateral enlarged LNs, Supraclavicular adenopathy: right LN enlarged and non-tender and Inguinal adenopathy: palpable and enlarged, non-tender Neurologic: Alert and oriented X 3. Denies headache, blurry vision or visual field changes. Positive posterior neck tenderness and pain with passive rotation and flexion.   Lab Results  Recent Labs  12/21/16 0825 12/22/16 0658  WBC 6.9 9.3  HGB 8.9* 8.3*  HCT 28.8* 26.6*  NA 126* 125*  K 3.8 3.4*  CL 98* 95*  CO2 24 24  BUN 9 7  CREATININE 0.91 0.79   Liver Panel  Recent Labs  12/20/16 1947 12/22/16 0658  PROT 8.4* 7.7  ALBUMIN 2.6* 2.2*  AST 139* 154*  ALT 62 89*  ALKPHOS 76 82  BILITOT 1.3* 0.8   Sedimentation Rate No results for input(s): ESRSEDRATE in the last 72 hours. C-Reactive Protein No results for input(s): CRP in the last 72 hours.  Microbiology:  Studies/Results: Ct Head Wo Contrast  Result Date: 12/21/2016 CLINICAL DATA:  HIV. Concern for disseminated infection or meningitis. Lymphadenopathy. Nuchal rigidity. EXAM: CT HEAD WITHOUT CONTRAST TECHNIQUE: Contiguous axial images were obtained from the base of the skull through the vertex without intravenous contrast. COMPARISON:  Head CT 12/17/2016 FINDINGS: Brain: No mass lesion, intraparenchymal hemorrhage or extra-axial collection. No evidence of acute cortical infarct. Brain parenchyma and CSF-containing spaces are normal for age. Vascular: No hyperdense vessel or unexpected calcification. Skull: Normal visualized skull base, calvarium and extracranial soft tissues. Sinuses/Orbits: No sinus  fluid levels or advanced mucosal thickening. No mastoid effusion. Normal orbits. IMPRESSION: Normal head CT. Electronically Signed   By: Ulyses Jarred M.D.   On: 12/21/2016 19:06   Ct Chest Wo Contrast  Result Date: 12/21/2016 CLINICAL DATA:  Evaluate for lymph node enlargement. 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 31 year old with HIV.  EXAM: CT CHEST WITHOUT CONTRAST TECHNIQUE: Multidetector CT imaging of the chest was performed following the standard protocol without IV contrast. COMPARISON:  Chest radiograph 12/20/2016 FINDINGS: Cardiovascular: Normal caliber of the thoracic aorta. No gross abnormality to the pulmonary arteries. Limited evaluation of the vascular structures without intravenous contrast. Mediastinum/Nodes: Limited evaluation for mediastinal and hilar lymphadenopathy on this noncontrast examination. There appears to be subcarinal fullness measuring up to 1.8 cm which probably represents enlarged lymph nodes. Soft tissue fullness in the right hilum on sequence 4, image 75 measures 1.6 cm in the short axis. Multiple small lymph nodes scattered throughout the bilateral axillary regions. There appears to be multiple lymph nodes in the paratracheal region. There is low density in the anterior mediastinum which is nonspecific but could be associated with thymus. Cannot exclude right supraclavicular lymphadenopathy. Lungs/Pleura: There is trace right pleural fluid versus right pleural thickening. Trachea and mainstem bronchi are patent. Patchy areas of airspace disease in both lower lobes and both upper lobes. Small cavitary lesion in the right upper lobe measuring 6 mm on sequence 6, image 47. Scattered areas of interstitial thickening in the right lung. Upper Abdomen: Images of the upper abdomen are unremarkable. Musculoskeletal: No acute bone abnormality. IMPRESSION: Bilateral airspace disease with a small cavitary lesion in the right upper lobe. Findings compatible with pneumonia. Trace right pleural fluid versus right pleural thickening. Mediastinal and hilar lymphadenopathy. Evaluation of the lymphadenopathy is limited on this noncontrast examination. There are also prominent bilateral axillary lymph nodes. Electronically Signed   By: Markus Daft M.D.   On: 12/21/2016 21:15   Ct Abdomen Pelvis W Contrast  Result Date:  12/21/2016 CLINICAL DATA:  Known rectal abscesses, status post incision and drainage. Personal history of HIV. Initial encounter. EXAM: CT ABDOMEN AND PELVIS WITH CONTRAST TECHNIQUE: Multidetector CT imaging of the abdomen and pelvis was performed using the standard protocol following bolus administration of intravenous contrast. CONTRAST:  181m ISOVUE-300 IOPAMIDOL (ISOVUE-300) INJECTION 61% COMPARISON:  CT of the abdomen and pelvis performed 12/17/2016 FINDINGS: Lower chest: Mild right basilar airspace opacity raises concern for pneumonia. A 1.6 cm azygoesophageal recess node is seen. Hepatobiliary: The liver is unremarkable in appearance. A stone is noted dependently within the gallbladder. The gallbladder is otherwise unremarkable. The common bile duct remains normal in caliber. Pancreas: The pancreas is within normal limits. Spleen: The spleen is unremarkable in appearance. Adrenals/Urinary Tract: The adrenal glands are unremarkable in appearance. The kidneys are within normal limits. There is no evidence of hydronephrosis. No renal or ureteral stones are identified. No perinephric stranding is seen. Stomach/Bowel: The stomach is unremarkable in appearance. The small bowel is within normal limits. The patient is status post appendectomy. Mild wall thickening is noted along the distal sigmoid colon and rectum, raising concern for proctitis. The remainder of the colon is filled with contrast and is unremarkable in appearance. Vascular/Lymphatic: The abdominal aorta is unremarkable in appearance. The inferior vena cava is grossly unremarkable. Prominent retroperitoneal nodes are seen, measuring up to 1.1 cm in short axis. Prominent nodes are also noted at both sides of the pelvis, measuring up to 1.7 cm in short axis. Enlarged bilateral inguinal nodes are seen,  measuring up to 1.6 cm in short axis. Reproductive: The bladder is mildly distended and contains trace contrast. The prostate remains normal in size.  Other: Trace ascites is noted about the right side of the abdomen. Diffuse presacral soft tissue inflammation is again noted. There is diffuse soft tissue edema tracking about the anorectal canal, without definite evidence of abscess. Edema is seen tracking along the right side of the gluteal cleft, similar in appearance to the prior study. This may reflect a persistent subcutaneous abscess, given stability from the prior study, though not well assessed on delayed images. A small drainage catheter is noted posterior to the anorectal canal. Scrotal wall edema is noted, worse on the right, with small bilateral hydroceles. Musculoskeletal: No acute osseous abnormalities are identified. The visualized musculature is unremarkable in appearance. IMPRESSION: 1. Edema tracking along the right side of the gluteal cleft is similar in appearance to the prior study. This reflected a subcutaneous abscess on the prior study. This most likely reflects a persistent small subcutaneous abscess. 2. Diffuse soft tissue edema tracking about the anorectal canal, without additional abscess. Diffuse presacral soft tissue inflammation noted. 3. Scrotal wall edema, worse on the right, with small bilateral hydroceles. 4. Mild right basilar airspace opacity raises concern for pneumonia. 5. Mild wall thickening along the distal sigmoid colon and rectum, raising concern for proctitis. 6. Enlarged azygoesophageal recess, retroperitoneal, pelvic sidewall and bilateral inguinal nodes seen, measuring up to 1.7 cm in short axis. This likely reflects the underlying infection. 7. Cholelithiasis.  Gallbladder otherwise unremarkable. Electronically Signed   By: Garald Balding M.D.   On: 12/21/2016 01:29     Assessment/Plan: 1. Perirectal Abscess = -s/p I&D >> cultures with few gram variable rods and WBC -Continue Ceftriaxone and Metronidazole.  -D/C Vancomycin to limit nephrotoxic agents as he now will require liposomal ampho for suspected  CM -Plan per surgery re: care and dressing changes.    2. AIDS =  -Will stop ART for now as he is at r/f IRIS. Discussed with patient.  -VL pending, CD4 21 -LP to be done today, appreciate assistance -Continue Bactrim QD with Azithro Qweek -RPR neg -D/W IR re: the possibility of biopsying lymph node with ultrasound as we are concerned about disseminated disease.   3. 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   3. 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 Adding Valtrex 1g BID x 10d course.   [name], MSN, NP-C [facility] for Infectious [place] Cell: [phone] Pager: [phone]  12/22/2016 4:12 PM    \

## 2016-12-22 NOTE — Procedures (Signed)
Lumbar Puncture Procedure Note  Pre-operative Diagnosis: r/o crypto men  Post-operative Diagnosis: same   Indications: Diagnostic  Procedure Details   Consent: Informed consent was obtained. Risks of the procedure were discussed including: infection, bleeding, pain and headache.  The patient was positioned under sterile conditions. Betadine solution and sterile drapes were utilized. A spinal needle was inserted at the L3 - L4 interspace.  Spinal fluid was obtained and sent to the laboratory.  Findings 59mL of bloody spinal fluid was obtained. But CLEARED - traumatic mild Opening Pressure: wnl cm H2O pressure. Closing Pressure: wnl cm H2O pressure.  Complications:  None; patient tolerated the procedure well.        Condition: stable  Plan Bed rest for 5 hours.  He kept archiog back, so could not fill tube 3    Lavon Paganini. Titus Mould, MD, [place] Pgr: [facility] \

## 2016-12-22 NOTE — Progress Notes (Signed)
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 He was incarcerated on 11/04/16 and seems his ART was not reported at intake by the patient. He missed FU appt with [facility] ID team d/t incarceration and has not had follow up since with any ID team.   [name], MSN, NP-C [place] for [facility]ious [place] Cell: [phone] Pager: [phone]

## 2016-12-22 NOTE — Progress Notes (Signed)
[facility] Progress Note  1 Day Post-Op  Subjective: CC: Pain Patient complaining of severe pain. Sleeping when I entered room, and fell asleep multiple times while I was in room. Tolerating PO intake. UOP good. VSS.   Objective: Vital signs in last 24 hours: Temp:  [97.7 F (36.5 C)-98.9 F (37.2 C)] 98.8 F (37.1 C) (05/24 0547) Pulse Rate:  [61-88] 62 (05/24 0547) Resp:  [8-19] 19 (05/24 0547) BP: (109-161)/(69-113) 137/87 (05/24 0547) SpO2:  [87 %-100 %] 99 % (05/24 0547) Last BM Date: 12/21/16  Intake/Output from previous day: 05/23 0701 - 05/24 0700 In: 4079 [P.O.:714; I.V.:2465; IV Piggyback:900] Out: 1450 [Urine:1450] Intake/Output this shift: Total I/O In: 0  Out: 200 [Urine:200]  PE: Gen:  Lethargic, but easily roused Card:  Regular rate and rhythm Pulm:  Normal effort, clear to auscultation bilaterally, good air movement Abd: Soft, non-tender, non-distended Perineal wound: 3 cm deep, packing removed with minimal bloody drainage, minimal erythema around incision, no induration. Skin: warm and dry, no rashes  Psych: A&Ox3   Lab Results:   Recent Labs  12/21/16 0825 12/22/16 0658  WBC 6.9 9.3  HGB 8.9* 8.3*  HCT 28.8* 26.6*  PLT 111* 95*   BMET  Recent Labs  12/21/16 0825 12/22/16 0658  NA 126* 125*  K 3.8 3.4*  CL 98* 95*  CO2 24 24  GLUCOSE 85 107*  BUN 9 7  CREATININE 0.91 0.79  CALCIUM 7.8* 7.3*   PT/INR No results for input(s): LABPROT, INR in the last 72 hours. CMP     Component Value Date/Time   NA 125 (L) 12/22/2016 0658   K 3.4 (L) 12/22/2016 0658   CL 95 (L) 12/22/2016 0658   CO2 24 12/22/2016 0658   GLUCOSE 107 (H) 12/22/2016 0658   BUN 7 12/22/2016 0658   CREATININE 0.79 12/22/2016 0658   CALCIUM 7.3 (L) 12/22/2016 0658   PROT 7.7 12/22/2016 0658   ALBUMIN 2.2 (L) 12/22/2016 0658   AST 154 (H) 12/22/2016 0658   ALT 89 (H) 12/22/2016 0658   ALKPHOS 82 12/22/2016 0658   BILITOT 0.8 12/22/2016 0658    GFRNONAA >60 12/22/2016 0658   GFRAA >60 12/22/2016 0658   Lipase  No results found for: LIPASE     Studies/Results: Ct Head Wo Contrast  Result Date: 12/21/2016 CLINICAL DATA:  HIV. Concern for disseminated infection or meningitis. Lymphadenopathy. Nuchal rigidity. EXAM: CT HEAD WITHOUT CONTRAST TECHNIQUE: Contiguous axial images were obtained from the base of the skull through the vertex without intravenous contrast. COMPARISON:  Head CT 12/17/2016 FINDINGS: Brain: No mass lesion, intraparenchymal hemorrhage or extra-axial collection. No evidence of acute cortical infarct. Brain parenchyma and CSF-containing spaces are normal for age. Vascular: No hyperdense vessel or unexpected calcification. Skull: Normal visualized skull base, calvarium and extracranial soft tissues. Sinuses/Orbits: No sinus fluid levels or advanced mucosal thickening. No mastoid effusion. Normal orbits. IMPRESSION: Normal head CT. Electronically Signed   By: Ulyses Jarred M.D.   On: 12/21/2016 19:06   Ct Chest Wo Contrast  Result Date: 12/21/2016 CLINICAL DATA:  Evaluate for lymph node enlargement. 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 31 year old with HIV. EXAM: CT CHEST WITHOUT CONTRAST TECHNIQUE: Multidetector CT imaging of the chest was performed following the standard protocol without IV contrast. COMPARISON:  Chest radiograph 12/20/2016 FINDINGS: Cardiovascular: Normal caliber of the thoracic aorta. No gross abnormality to the pulmonary arteries. Limited evaluation of the vascular structures without intravenous contrast. Mediastinum/Nodes: Limited evaluation for mediastinal and hilar lymphadenopathy on this  noncontrast examination. There appears to be subcarinal fullness measuring up to 1.8 cm which probably represents enlarged lymph nodes. Soft tissue fullness in the right hilum on sequence 4, image 75 measures 1.6 cm in the short axis. Multiple small lymph nodes scattered throughout the bilateral axillary regions. There appears  to be multiple lymph nodes in the paratracheal region. There is low density in the anterior mediastinum which is nonspecific but could be associated with thymus. Cannot exclude right supraclavicular lymphadenopathy. Lungs/Pleura: There is trace right pleural fluid versus right pleural thickening. Trachea and mainstem bronchi are patent. Patchy areas of airspace disease in both lower lobes and both upper lobes. Small cavitary lesion in the right upper lobe measuring 6 mm on sequence 6, image 47. Scattered areas of interstitial thickening in the right lung. Upper Abdomen: Images of the upper abdomen are unremarkable. Musculoskeletal: No acute bone abnormality. IMPRESSION: Bilateral airspace disease with a small cavitary lesion in the right upper lobe. Findings compatible with pneumonia. Trace right pleural fluid versus right pleural thickening. Mediastinal and hilar lymphadenopathy. Evaluation of the lymphadenopathy is limited on this noncontrast examination. There are also prominent bilateral axillary lymph nodes. Electronically Signed   By: Markus Daft M.D.   On: 12/21/2016 21:15   Ct Abdomen Pelvis W Contrast  Result Date: 12/21/2016 CLINICAL DATA:  Known rectal abscesses, status post incision and drainage. Personal history of HIV. Initial encounter. EXAM: CT ABDOMEN AND PELVIS WITH CONTRAST TECHNIQUE: Multidetector CT imaging of the abdomen and pelvis was performed using the standard protocol following bolus administration of intravenous contrast. CONTRAST:  164m ISOVUE-300 IOPAMIDOL (ISOVUE-300) INJECTION 61% COMPARISON:  CT of the abdomen and pelvis performed 12/17/2016 FINDINGS: Lower chest: Mild right basilar airspace opacity raises concern for pneumonia. A 1.6 cm azygoesophageal recess node is seen. Hepatobiliary: The liver is unremarkable in appearance. A stone is noted dependently within the gallbladder. The gallbladder is otherwise unremarkable. The common bile duct remains normal in caliber. Pancreas:  The pancreas is within normal limits. Spleen: The spleen is unremarkable in appearance. Adrenals/Urinary Tract: The adrenal glands are unremarkable in appearance. The kidneys are within normal limits. There is no evidence of hydronephrosis. No renal or ureteral stones are identified. No perinephric stranding is seen. Stomach/Bowel: The stomach is unremarkable in appearance. The small bowel is within normal limits. The patient is status post appendectomy. Mild wall thickening is noted along the distal sigmoid colon and rectum, raising concern for proctitis. The remainder of the colon is filled with contrast and is unremarkable in appearance. Vascular/Lymphatic: The abdominal aorta is unremarkable in appearance. The inferior vena cava is grossly unremarkable. Prominent retroperitoneal nodes are seen, measuring up to 1.1 cm in short axis. Prominent nodes are also noted at both sides of the pelvis, measuring up to 1.7 cm in short axis. Enlarged bilateral inguinal nodes are seen, measuring up to 1.6 cm in short axis. Reproductive: The bladder is mildly distended and contains trace contrast. The prostate remains normal in size. Other: Trace ascites is noted about the right side of the abdomen. Diffuse presacral soft tissue inflammation is again noted. There is diffuse soft tissue edema tracking about the anorectal canal, without definite evidence of abscess. Edema is seen tracking along the right side of the gluteal cleft, similar in appearance to the prior study. This may reflect a persistent subcutaneous abscess, given stability from the prior study, though not well assessed on delayed images. A small drainage catheter is noted posterior to the anorectal canal. Scrotal wall edema is  noted, worse on the right, with small bilateral hydroceles. Musculoskeletal: No acute osseous abnormalities are identified. The visualized musculature is unremarkable in appearance. IMPRESSION: 1. Edema tracking along the right side of the  gluteal cleft is similar in appearance to the prior study. This reflected a subcutaneous abscess on the prior study. This most likely reflects a persistent small subcutaneous abscess. 2. Diffuse soft tissue edema tracking about the anorectal canal, without additional abscess. Diffuse presacral soft tissue inflammation noted. 3. Scrotal wall edema, worse on the right, with small bilateral hydroceles. 4. Mild right basilar airspace opacity raises concern for pneumonia. 5. Mild wall thickening along the distal sigmoid colon and rectum, raising concern for proctitis. 6. Enlarged azygoesophageal recess, retroperitoneal, pelvic sidewall and bilateral inguinal nodes seen, measuring up to 1.7 cm in short axis. This likely reflects the underlying infection. 7. Cholelithiasis.  Gallbladder otherwise unremarkable. Electronically Signed   By: Garald Balding M.D.   On: 12/21/2016 01:29    Anti-infectives: Anti-infectives    Start     Dose/Rate Route Frequency Ordered Stop   12/23/16 0900  amphotericin B liposome (AMBISOME) 200 mg in dextrose 5 % 500 mL IVPB     200 mg 250 mL/hr over 120 Minutes Intravenous Every 24 hours 12/22/16 0919     12/23/16 0800  azithromycin (ZITHROMAX) tablet 1,200 mg     1,200 mg Oral Weekly 12/22/16 0811     12/22/16 1200  flucytosine (ANCOBON) capsule 1,500 mg     1,500 mg Oral Every 6 hours 12/22/16 0614     12/22/16 1000  azithromycin (ZITHROMAX) tablet 250 mg  Status:  Discontinued     250 mg Oral Daily 12/21/16 0224 12/21/16 1131   12/22/16 1000  doxycycline (VIBRA-TABS) tablet 100 mg     100 mg Oral Every 12 hours 12/22/16 0942     12/22/16 0000  amphotericin B liposome (AMBISOME) 190 mg in dextrose 5 % 500 mL IVPB  Status:  Discontinued     3 mg/kg  64.2 kg 250 mL/hr over 120 Minutes Intravenous Every 24 hours 12/21/16 2313 12/22/16 0919   12/22/16 0000  flucytosine (ANCOBON) capsule 250 mg  Status:  Discontinued     250 mg Oral Every 6 hours 12/21/16 2314 12/21/16 2325    12/22/16 0000  flucytosine (ANCOBON) capsule 1,500 mg  Status:  Discontinued     1,500 mg Oral Every 6 hours 12/21/16 2326 12/22/16 0614   12/21/16 1645  cefTRIAXone (ROCEPHIN) 2 g in dextrose 5 % 50 mL IVPB     2 g 100 mL/hr over 30 Minutes Intravenous Every 24 hours 12/21/16 1539     12/21/16 1645  metroNIDAZOLE (FLAGYL) IVPB 500 mg  Status:  Discontinued     500 mg 100 mL/hr over 60 Minutes Intravenous Every 8 hours 12/21/16 1539 12/21/16 2348   12/21/16 1600  cefTRIAXone (ROCEPHIN) 2 g in dextrose 5 % 50 mL IVPB  Status:  Discontinued     2 g 100 mL/hr over 30 Minutes Intravenous Every 24 hours 12/21/16 1131 12/21/16 1558   12/21/16 1400  vancomycin (VANCOCIN) IVPB 750 mg/150 ml premix  Status:  Discontinued     750 mg 150 mL/hr over 60 Minutes Intravenous Every 8 hours 12/21/16 0142 12/22/16 0942   12/21/16 1000  sulfamethoxazole-trimethoprim (BACTRIM DS,SEPTRA DS) 800-160 MG per tablet 1 tablet  Status:  Discontinued     1 tablet Oral Daily 12/21/16 0058 12/21/16 0100   12/21/16 1000  abacavir-dolutegravir-lamiVUDine (TRIUMEQ) 600-50-300 MG per tablet  1 tablet  Status:  Discontinued     1 tablet Oral Daily 12/21/16 0204 12/22/16 0903   12/21/16 1000  sulfamethoxazole-trimethoprim (BACTRIM DS,SEPTRA DS) 800-160 MG per tablet 1 tablet     1 tablet Oral Daily 12/21/16 0204     12/21/16 0800  ceFEPIme (MAXIPIME) 2 g in dextrose 5 % 50 mL IVPB  Status:  Discontinued     2 g 100 mL/hr over 30 Minutes Intravenous Every 8 hours 12/21/16 0137 12/21/16 1131   12/21/16 0600  piperacillin-tazobactam (ZOSYN) IVPB 3.375 g  Status:  Discontinued     3.375 g 12.5 mL/hr over 240 Minutes Intravenous Every 8 hours 12/21/16 0058 12/21/16 0132   12/21/16 0200  metroNIDAZOLE (FLAGYL) IVPB 500 mg     500 mg 100 mL/hr over 60 Minutes Intravenous Every 8 hours 12/21/16 0137     12/21/16 0145  ceFEPIme (MAXIPIME) 2 g in dextrose 5 % 50 mL IVPB     2 g 100 mL/hr over 30 Minutes Intravenous  Once 12/21/16  0137 12/21/16 0358   12/21/16 0145  vancomycin (VANCOCIN) IVPB 1000 mg/200 mL premix     1,000 mg 200 mL/hr over 60 Minutes Intravenous  Once 12/21/16 0142 12/21/16 0429   12/21/16 0130  piperacillin-tazobactam (ZOSYN) IVPB 3.375 g  Status:  Discontinued     3.375 g 100 mL/hr over 30 Minutes Intravenous  Once 12/21/16 0115 12/21/16 0132   12/21/16 0130  azithromycin (ZITHROMAX) powder 1 g     1 g Oral  Once 12/21/16 0125 12/21/16 0328       Assessment/Plan Perirectal abscess S/p I&D 5/23 Dr Rosendo Gros - POD#1 - packing removed and wound with minimal bloody drainage - repacked wound loosely with iodoform gauze - dressing changes BID or if contaminated - patient is complaining of severe pain but falling asleep during exam and dressing change. Will decrease pain medication due to concern over sedation and risk of respiratory compromise.   Proctitis Possible PNA Possible TB AIDS Incarceration  Internal medicine asking to take over primary management, we agree that this is appropriate at this time. Will continue to follow for wound care.   LOS: 1 day    Brigid Re , [facility] 12/22/2016, 9:44 AM Pager: [phone] Consults: [phone] Mon-Fri 7:00 am-4:30 pm Sat-Sun 7:00 am-11:30 am

## 2016-12-22 NOTE — Consult Note (Signed)
Chief Complaint: Patient was seen in consultation today for inguinal lymphadenopathy biopsy Chief Complaint  Patient presents with  . Rectal Bleeding  . Generalized Body Aches   at the request of Mee Hives NP-ID  Referring Physician(s): Mee Hives NP- ID  Supervising Physician: Arne Cleveland  Patient Status: [facility] - In-pt  History of Present Illness: William Pena is a 31 y.o. male   HIV/AIDS To ED with recurrent perirectal abscess Fevers; LAN; nuchal rigidity Work up : IMPRESSION: 1. Edema tracking along the right side of the gluteal cleft is similar in appearance to the prior study. This reflected a subcutaneous abscess on the prior study. This most likely reflects a persistent small subcutaneous abscess. 2. Diffuse soft tissue edema tracking about the anorectal canal, without additional abscess. Diffuse presacral soft tissue inflammation noted. 3. Scrotal wall edema, worse on the right, with small bilateral hydroceles. 4. Mild right basilar airspace opacity raises concern for pneumonia. 5. Mild wall thickening along the distal sigmoid colon and rectum, raising concern for proctitis. 6. Enlarged azygoesophageal recess, retroperitoneal, pelvic sidewall and bilateral inguinal nodes seen, measuring up to 1.7 cm in short axis. This likely reflects the underlying infection. 7. Cholelithiasis.  Gallbladder otherwise unremarkable.  Request from ID for biopsy to determine disseminated disease Dr Vernard Gambles has reviewed imaging and approves procedure   Past Medical History:  Diagnosis Date  . HIV (human immunodeficiency virus infection) ([place])   . Perirectal abscess     Past Surgical History:  Procedure Laterality Date  . INCISION AND DRAINAGE PERIRECTAL ABSCESS    . INCISION AND DRAINAGE PERIRECTAL ABSCESS N/A 12/21/2016   Procedure: IRRIGATION AND DEBRIDEMENT PERIRECTAL ABSCESS;  Surgeon: Ralene Ok, MD;  Location: [facility];  Service: General;  Laterality: N/A;     Allergies: Augmentin [amoxicillin-pot clavulanate]  Medications: Prior to Admission medications   Medication Sig Start Date End Date Taking? Authorizing Provider  acetaminophen (TYLENOL) 325 MG tablet Take 975 mg by mouth 3 (three) times daily as needed for moderate pain.    Yes [provider]  docusate sodium (COLACE) 100 MG capsule Take 100 mg by mouth 2 (two) times daily.   Yes [provider]  ondansetron (ZOFRAN ODT) 4 MG disintegrating tablet Take 1 tablet (4 mg total) by mouth every 8 (eight) hours as needed for nausea or vomiting. 12/18/16  Yes Quintella Reichert, MD  sulfamethoxazole-trimethoprim (BACTRIM DS,SEPTRA DS) 800-160 MG tablet Take 1 tablet by mouth daily. 12/10/16 01/10/17 Yes [provider]  ibuprofen (ADVIL,MOTRIN) 800 MG tablet Take 1 tablet (800 mg total) by mouth every 8 (eight) hours as needed. Patient not taking: Reported on 12/17/2016 12/15/16   Long, Wonda Olds, MD     Family History  Problem Relation Age of Onset  . Diabetes Other     Social History   Social History  . Marital status: Single    Spouse name: N/A  . Number of children: N/A  . Years of education: N/A   Social History Main Topics  . Smoking status: Former Smoker    Packs/day: 0.33    Types: Cigarettes  . Smokeless tobacco: Never Used  . Alcohol use No  . Drug use: No  . Sexual activity: Not Asked   Other Topics Concern  . None   Social History Narrative  . None    Review of Systems: A 12 point ROS discussed and pertinent positives are indicated in the HPI above.  All other systems are negative.  Review of Systems  Constitutional: Positive for activity change, appetite change, fatigue and fever.  Respiratory: Negative for cough and shortness of breath.   Cardiovascular: Negative for chest pain.  Gastrointestinal: Positive for abdominal pain.  Musculoskeletal: Positive for neck stiffness.  Neurological: Positive for weakness.  Psychiatric/Behavioral:  Negative for behavioral problems and confusion.    Vital Signs: BP (!) 147/88 (BP Location: Left Arm)   Pulse 62   Temp 98.5 F (36.9 C) (Oral)   Resp 19   Ht _0  (1.676 m)   Wt 141 lb 8 oz (64.2 kg)   SpO2 100%   BMI 22.84 kg/m   Physical Exam  Constitutional: [name] oriented to person, place, and time.  Cardiovascular: Normal rate and regular rhythm.   Pulmonary/Chest: Effort normal and breath sounds normal.  Abdominal: Soft. Bowel sounds are normal.  Musculoskeletal: Normal range of motion.  Neurological: [name] alert and oriented to person, place, and time.  Alert and oriented Groggy  Skin: Skin is warm and dry.  Multiple tattoos  Psychiatric: He has a normal mood and affect. His behavior is normal. Judgment and thought content normal.  Nursing note and vitals reviewed.   Mallampati Score:  MD Evaluation Airway: WNL Heart: WNL Abdomen: WNL Chest/ Lungs: WNL ASA  Classification: 3 Mallampati/Airway Score: One  Imaging: Dg Chest 2 View  Result Date: 12/20/2016 CLINICAL DATA:  Abdominal pain with nausea and vomiting and shortness of breath EXAM: CHEST  2 VIEW COMPARISON:  None. FINDINGS: The heart size and mediastinal contours are within normal limits. Both lungs are clear. The visualized skeletal structures are unremarkable. IMPRESSION: No active cardiopulmonary disease. Electronically Signed   By: Inez Catalina M.D.   On: 12/20/2016 07:48   Ct Head Wo Contrast  Result Date: 12/21/2016 CLINICAL DATA:  HIV. Concern for disseminated infection or meningitis. Lymphadenopathy. Nuchal rigidity. EXAM: CT HEAD WITHOUT CONTRAST TECHNIQUE: Contiguous axial images were obtained from the base of the skull through the vertex without intravenous contrast. COMPARISON:  Head CT 12/17/2016 FINDINGS: Brain: No mass lesion, intraparenchymal hemorrhage or extra-axial collection. No evidence of acute cortical infarct. Brain parenchyma and CSF-containing spaces are normal for age. Vascular:  No hyperdense vessel or unexpected calcification. Skull: Normal visualized skull base, calvarium and extracranial soft tissues. Sinuses/Orbits: No sinus fluid levels or advanced mucosal thickening. No mastoid effusion. Normal orbits. IMPRESSION: Normal head CT. Electronically Signed   By: Ulyses Jarred M.D.   On: 12/21/2016 19:06   Ct Head Wo Contrast  Result Date: 12/17/2016 CLINICAL DATA:  Headache EXAM: CT HEAD WITHOUT CONTRAST TECHNIQUE: Contiguous axial images were obtained from the base of the skull through the vertex without intravenous contrast. COMPARISON:  None. FINDINGS: Brain: No evidence of acute infarction, hemorrhage, hydrocephalus, extra-axial collection or mass lesion/mass effect. Vascular: No hyperdense vessel or unexpected calcification. Skull: Normal. Negative for fracture or focal lesion. Sinuses/Orbits: No acute finding. Other: None IMPRESSION: No acute intracranial abnormality. Electronically Signed   By: Ashley Royalty M.D.   On: 12/17/2016 21:58   Ct Chest Wo Contrast  Result Date: 12/21/2016 CLINICAL DATA:  Evaluate for lymph node enlargement. 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 31 year old with HIV. EXAM: CT CHEST WITHOUT CONTRAST TECHNIQUE: Multidetector CT imaging of the chest was performed following the standard protocol without IV contrast. COMPARISON:  Chest radiograph 12/20/2016 FINDINGS: Cardiovascular: Normal caliber of the thoracic aorta. No gross abnormality to the pulmonary arteries. Limited evaluation of the vascular structures without intravenous contrast. Mediastinum/Nodes: Limited evaluation for mediastinal and hilar lymphadenopathy on this noncontrast  examination. There appears to be subcarinal fullness measuring up to 1.8 cm which probably represents enlarged lymph nodes. Soft tissue fullness in the right hilum on sequence 4, image 75 measures 1.6 cm in the short axis. Multiple small lymph nodes scattered throughout the bilateral axillary regions. There appears to be  multiple lymph nodes in the paratracheal region. There is low density in the anterior mediastinum which is nonspecific but could be associated with thymus. Cannot exclude right supraclavicular lymphadenopathy. Lungs/Pleura: There is trace right pleural fluid versus right pleural thickening. Trachea and mainstem bronchi are patent. Patchy areas of airspace disease in both lower lobes and both upper lobes. Small cavitary lesion in the right upper lobe measuring 6 mm on sequence 6, image 47. Scattered areas of interstitial thickening in the right lung. Upper Abdomen: Images of the upper abdomen are unremarkable. Musculoskeletal: No acute bone abnormality. IMPRESSION: Bilateral airspace disease with a small cavitary lesion in the right upper lobe. Findings compatible with pneumonia. Trace right pleural fluid versus right pleural thickening. Mediastinal and hilar lymphadenopathy. Evaluation of the lymphadenopathy is limited on this noncontrast examination. There are also prominent bilateral axillary lymph nodes. Electronically Signed   By: Markus Daft M.D.   On: 12/21/2016 21:15   Ct Abdomen Pelvis W Contrast  Result Date: 12/21/2016 CLINICAL DATA:  Known rectal abscesses, status post incision and drainage. Personal history of HIV. Initial encounter. EXAM: CT ABDOMEN AND PELVIS WITH CONTRAST TECHNIQUE: Multidetector CT imaging of the abdomen and pelvis was performed using the standard protocol following bolus administration of intravenous contrast. CONTRAST:  152m ISOVUE-300 IOPAMIDOL (ISOVUE-300) INJECTION 61% COMPARISON:  CT of the abdomen and pelvis performed 12/17/2016 FINDINGS: Lower chest: Mild right basilar airspace opacity raises concern for pneumonia. A 1.6 cm azygoesophageal recess node is seen. Hepatobiliary: The liver is unremarkable in appearance. A stone is noted dependently within the gallbladder. The gallbladder is otherwise unremarkable. The common bile duct remains normal in caliber. Pancreas: The  pancreas is within normal limits. Spleen: The spleen is unremarkable in appearance. Adrenals/Urinary Tract: The adrenal glands are unremarkable in appearance. The kidneys are within normal limits. There is no evidence of hydronephrosis. No renal or ureteral stones are identified. No perinephric stranding is seen. Stomach/Bowel: The stomach is unremarkable in appearance. The small bowel is within normal limits. The patient is status post appendectomy. Mild wall thickening is noted along the distal sigmoid colon and rectum, raising concern for proctitis. The remainder of the colon is filled with contrast and is unremarkable in appearance. Vascular/Lymphatic: The abdominal aorta is unremarkable in appearance. The inferior vena cava is grossly unremarkable. Prominent retroperitoneal nodes are seen, measuring up to 1.1 cm in short axis. Prominent nodes are also noted at both sides of the pelvis, measuring up to 1.7 cm in short axis. Enlarged bilateral inguinal nodes are seen, measuring up to 1.6 cm in short axis. Reproductive: The bladder is mildly distended and contains trace contrast. The prostate remains normal in size. Other: Trace ascites is noted about the right side of the abdomen. Diffuse presacral soft tissue inflammation is again noted. There is diffuse soft tissue edema tracking about the anorectal canal, without definite evidence of abscess. Edema is seen tracking along the right side of the gluteal cleft, similar in appearance to the prior study. This may reflect a persistent subcutaneous abscess, given stability from the prior study, though not well assessed on delayed images. A small drainage catheter is noted posterior to the anorectal canal. Scrotal wall edema is noted,  worse on the right, with small bilateral hydroceles. Musculoskeletal: No acute osseous abnormalities are identified. The visualized musculature is unremarkable in appearance. IMPRESSION: 1. Edema tracking along the right side of the  gluteal cleft is similar in appearance to the prior study. This reflected a subcutaneous abscess on the prior study. This most likely reflects a persistent small subcutaneous abscess. 2. Diffuse soft tissue edema tracking about the anorectal canal, without additional abscess. Diffuse presacral soft tissue inflammation noted. 3. Scrotal wall edema, worse on the right, with small bilateral hydroceles. 4. Mild right basilar airspace opacity raises concern for pneumonia. 5. Mild wall thickening along the distal sigmoid colon and rectum, raising concern for proctitis. 6. Enlarged azygoesophageal recess, retroperitoneal, pelvic sidewall and bilateral inguinal nodes seen, measuring up to 1.7 cm in short axis. This likely reflects the underlying infection. 7. Cholelithiasis.  Gallbladder otherwise unremarkable. Electronically Signed   By: Garald Balding M.D.   On: 12/21/2016 01:29   Ct Abdomen Pelvis W Contrast  Result Date: 12/17/2016 CLINICAL DATA:  Known rectal abscesses. Frequent vomiting past 4 days. Hypotension earlier today. HIV positive. EXAM: CT ABDOMEN AND PELVIS WITH CONTRAST TECHNIQUE: Multidetector CT imaging of the abdomen and pelvis was performed using the standard protocol following bolus administration of intravenous contrast. CONTRAST:  <See Chart> ISOVUE-300 IOPAMIDOL (ISOVUE-300) INJECTION 61% COMPARISON:  10/31/2016 FINDINGS: Lower chest: Lung bases are within normal. Hepatobiliary: Single punctate gallstone. Liver and biliary tree are within normal. Pancreas: Within normal. Spleen: Within normal. Adrenals/Urinary Tract: Adrenal glands are normal. Kidneys are normal in size without hydronephrosis or nephrolithiasis. Ureters and bladder are normal. Stomach/Bowel: Stomach and small bowel are within normal. Appendix is within normal. Colon is unremarkable. Vascular/Lymphatic: Vascular structures are within normal. Mildly prominent inguinal lymph nodes unchanged. Reproductive: Unremarkable. Other:  Slight interval decrease in size of a medial right gluteal subcutaneous abscess measuring 1 x 3.3 cm (previously 1.5 x 3.8 cm). Small caliber drain projects very superficial lesion over the lower gluteal crease just above this subcutaneous abscess. Mild wall thickening of the rectum with small round enhancing right perirectal abscess measuring 0.8 x 1.6 cm. Subtle perirectal inflammation. Few small perirectal lymph nodes are present. Slight worsening external iliac chain adenopathy with the largest node over the right iliac chain measuring 1.6 x 3.3 cm. Musculoskeletal: Within normal. IMPRESSION: Persistent rectal wall thickening and perirectal inflammation with new small right perirectal abscess measuring 0.8 x 1.6 cm. Interval decrease in size of a medial right gluteal subcutaneous abscess measuring 1 x 3.3 cm (previously 1.5 x 3.8 cm). Bilateral inguinal and iliac chain/ pelvic adenopathy slightly worse. Findings likely related to patient's ongoing infection described above as well as known HIV. Minimal cholelithiasis unchanged. These results were called by telephone at the time of interpretation on 12/17/2016 at 10:13 pm to Dr. Quintella Reichert , who verbally acknowledged these results. Electronically Signed   By: Marin Olp M.D.   On: 12/17/2016 22:14    Labs:  CBC:  Recent Labs  12/20/16 0647 12/20/16 1947 12/21/16 0825 12/22/16 0658  WBC 6.9 6.8 6.9 9.3  HGB 9.9* 9.1* 8.9* 8.3*  HCT 31.1* 28.9* 28.8* 26.6*  PLT 131* 110* 111* 95*    COAGS: No results for input(s): INR, APTT in the last 8760 hours.  BMP:  Recent Labs  12/20/16 0647 12/20/16 1947 12/21/16 0825 12/22/16 0658  NA 128* 127* 126* 125*  K 3.8 5.4* 3.8 3.4*  CL 94* 98* 98* 95*  CO2 _0 GLUCOSE 93  89 85 107*  BUN _0 CALCIUM 8.2* 7.5* 7.8* 7.3*  CREATININE 0.93 1.02 0.91 0.79  GFRNONAA >60 >60 >60 >60  GFRAA >60 >60 >60 >60    LIVER FUNCTION TESTS:  Recent Labs  12/17/16 2001 12/20/16 0647  12/20/16 1947 12/22/16 0658  BILITOT 1.0 0.6 1.3* 0.8  AST 21 62* 139* 154*  ALT 13* 38 62 89*  ALKPHOS 69 75 76 82  PROT 9.9* 9.2* 8.4* 7.7  ALBUMIN 3.2* 2.9* 2.6* 2.2*    TUMOR MARKERS: No results for input(s): AFPTM, CEA, CA199, CHROMGRNA in the last 8760 hours.  Assessment and Plan:  HIV/AIDS Lymphadenopathy; fevers For inguinal LN biopsy in IR 5/25 Risks and Benefits discussed with the patient including, but not limited to bleeding, infection, damage to adjacent structures or low yield requiring additional tests. All of the patient's questions were answered, patient is agreeable to proceed. Consent signed and in chart.   Thank you for this interesting consult.  I greatly enjoyed meeting [facility] and look forward to participating in their care.  A copy of this report was sent to the requesting provider on this date.  Electronically Signed: Lavonia Drafts, PA-C 12/22/2016, 11:49 AM   I spent a total of 40 Minutes    in face to face in clinical consultation, greater than 50% of which was counseling/coordinating care for inguinal LN bx

## 2016-12-22 NOTE — Progress Notes (Signed)
RN paged attending MD to give the results of gram stain. Per microbiology, gram stain from spinal fluid is showing yeast. RN awaiting a return call.

## 2016-12-22 NOTE — Progress Notes (Addendum)
Consult  PROGRESS NOTE                                                                                                                                                                                                             Patient Demographics:    William Pena, is a 31 y.o. male, DOB - [birth year], FUX:[id number]  Admit date - 12/20/2016   Admitting Physician Md Edison Pace, MD  Outpatient Primary MD for the patient is [name], [name], MD  LOS - 1   Chief Complaint  Patient presents with  . Rectal Bleeding  . 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  [facility] took over primary management on 5/24 given significant comorbidities.   Subjective:    [name] today Feeling a little better today, still complains of neck pain, a febrile . Denies any cough, chest pain or shortness of breath    Assessment  & Plan :    Principal Problem:   Perirectal abscess Active Problems:   Chronic pain   AIDS (acquired immune deficiency syndrome) (HCC)   Hyponatremia   Normocytic anemia   Elevated LFTs   Pulmonary infiltrates   Perirectal abscess - Status post I&D 5/23 by Dr. Rosendo Gros, wound care per surgical team, initially on vancomycin and cefepime, vancomycin stopped 5/24. And cefepime transitioned to ceftriaxone and Flagyl on 5/23.  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 - Patient with nuchal rigidity, need to rule out meningitis, LP under fluoroscopy is pending. - Antibiotics management per ID, continue with Bactrim daily, and azithromycin weekly. - IR seeing patient for possible lymph node biopsy as concern for disseminated disease. 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 - high risk for adrenal insufficiency given disseminated disease, will perform ACTH stimulation 3. test  Anemia: - Normocytic, stable.    Elevated LFTs: - Mild.Trend  Pneumonia - CT chest with right upper small cavitary lesions, suspicion for mTB vs. MAC vs Crypto lung involvement - [facility] in consult appreciated, plan for bronchoscopy to R/O TB.     Code Status : Full  Family Communication  : D/W patient  Consults : Surgery, ID, IR, PCCM  Procedures  : None  DVT Prophylaxis  : SCDs  Lab Results  Component Value Date   PLT 95 (L) 12/22/2016    Antibiotics  :    Anti-infectives    Start     Dose/Rate Route Frequency Ordered Stop   12/23/16 0900  amphotericin B liposome (AMBISOME) 200 mg in dextrose 5 % 500 mL IVPB     200 mg 250 mL/hr over 120 Minutes Intravenous Every 24 hours 12/22/16 0919     12/23/16 0800  azithromycin (ZITHROMAX) tablet 1,200 mg     1,200 mg Oral Weekly 12/22/16 0811     12/22/16 1200  flucytosine (ANCOBON) capsule 1,500 mg     1,500 mg Oral Every 6 hours 12/22/16 0614     12/22/16 1000  azithromycin (ZITHROMAX) tablet 250 mg  Status:  Discontinued     250 mg Oral Daily 12/21/16 0224 12/21/16 1131    12/22/16 1000  doxycycline (VIBRA-TABS) tablet 100 mg     100 mg Oral Every 12 hours 12/22/16 0942     12/22/16 0000  amphotericin B liposome (AMBISOME) 190 mg in dextrose 5 % 500 mL IVPB  Status:  Discontinued     3 mg/kg  64.2 kg 250 mL/hr over 120 Minutes Intravenous Every 24 hours 12/21/16 2313 12/22/16 0919   12/22/16 0000  flucytosine (ANCOBON) capsule 250 mg  Status:  Discontinued     250 mg Oral Every 6 hours 12/21/16 2314 12/21/16 2325   12/22/16 0000  flucytosine (ANCOBON) capsule 1,500 mg  Status:  Discontinued     1,500 mg Oral Every 6 hours 12/21/16 2326 12/22/16 0614   12/21/16 1645  cefTRIAXone (ROCEPHIN) 2 g in dextrose 5 % 50 mL IVPB     2 g 100 mL/hr over 30 Minutes Intravenous Every 24 hours 12/21/16 1539     12/21/16 1645  metroNIDAZOLE (FLAGYL) IVPB 500 mg  Status:  Discontinued     500 mg 100 mL/hr over 60 Minutes Intravenous Every 8 hours 12/21/16 1539 12/21/16 2348   12/21/16 1600  cefTRIAXone (ROCEPHIN) 2 g in dextrose 5 % 50 mL IVPB  Status:  Discontinued     2 g 100 mL/hr over 30 Minutes Intravenous Every 24 hours 12/21/16 1131 12/21/16 1558   12/21/16 1400  vancomycin (VANCOCIN) IVPB 750 mg/150 ml premix  Status:  Discontinued     750 mg 150 mL/hr over 60 Minutes Intravenous Every 8 hours 12/21/16 0142 12/22/16 0942   12/21/16 1000  sulfamethoxazole-trimethoprim (BACTRIM DS,SEPTRA DS) 800-160 MG per tablet 1 tablet  Status:  Discontinued     1 tablet Oral Daily 12/21/16 0058 12/21/16 0100   12/21/16 1000  abacavir-dolutegravir-lamiVUDine (TRIUMEQ) 600-50-300 MG per tablet 1 tablet  Status:  Discontinued  1 tablet Oral Daily 12/21/16 0204 12/22/16 0903   12/21/16 1000  sulfamethoxazole-trimethoprim (BACTRIM DS,SEPTRA DS) 800-160 MG per tablet 1 tablet     1 tablet Oral Daily 12/21/16 0204     12/21/16 0800  ceFEPIme (MAXIPIME) 2 g in dextrose 5 % 50 mL IVPB  Status:  Discontinued     2 g 100 mL/hr over 30 Minutes Intravenous Every 8 hours 12/21/16 0137  12/21/16 1131   12/21/16 0600  piperacillin-tazobactam (ZOSYN) IVPB 3.375 g  Status:  Discontinued     3.375 g 12.5 mL/hr over 240 Minutes Intravenous Every 8 hours 12/21/16 0058 12/21/16 0132   12/21/16 0200  metroNIDAZOLE (FLAGYL) IVPB 500 mg     500 mg 100 mL/hr over 60 Minutes Intravenous Every 8 hours 12/21/16 0137     12/21/16 0145  ceFEPIme (MAXIPIME) 2 g in dextrose 5 % 50 mL IVPB     2 g 100 mL/hr over 30 Minutes Intravenous  Once 12/21/16 0137 12/21/16 0358   12/21/16 0145  vancomycin (VANCOCIN) IVPB 1000 mg/200 mL premix     1,000 mg 200 mL/hr over 60 Minutes Intravenous  Once 12/21/16 0142 12/21/16 0429   12/21/16 0130  piperacillin-tazobactam (ZOSYN) IVPB 3.375 g  Status:  Discontinued     3.375 g 100 mL/hr over 30 Minutes Intravenous  Once 12/21/16 0115 12/21/16 0132   12/21/16 0130  azithromycin (ZITHROMAX) powder 1 g     1 g Oral  Once 12/21/16 0125 12/21/16 0328        Objective:   Vitals:   12/21/16 1525 12/21/16 2102 12/22/16 0547 12/22/16 1032  BP: (!) 149/91 119/82 137/87 (!) 147/88  Pulse: 76 61 62 62  Resp: _0 Temp: 97.7 F (36.5 C) 98.9 F (37.2 C) 98.8 F (37.1 C) 98.5 F (36.9 C)  TempSrc: Oral Oral  Oral  SpO2: 100% 96% 99% 100%  Weight:      Height:        Wt Readings from Last 3 Encounters:  12/21/16 64.2 kg (141 lb 8 oz)  12/20/16 65.8 kg (145 lb)  12/17/16 65.8 kg (145 lb)     Intake/Output Summary (Last 24 hours) at 12/22/16 1318 Last data filed at 12/22/16 1127  Gross per 24 hour  Intake          4198.99 ml  Output             1750 ml  Net          2448.99 ml     Physical Exam  lethargic today, but wakes up and answer questions appropriately  Oral thrush, cervical lymphadenopathy, posterior neck tenderness Further entry bilaterally, no wheezing rales or rhonchi, no use of accessory muscle S1, S2 Heard, RRR, no rubs murmurs gallops, Abdomen soft, nontender, nondistended Ext  with no edemas, clubbing or  cyanosis     Data Review:    CBC  Recent Labs Lab 12/17/16 2001 12/17/16 2033 12/20/16 0647 12/20/16 1947 12/21/16 0825 12/22/16 0658  WBC 6.3  --  6.9 6.8 6.9 9.3  HGB 9.6* 11.2* 9.9* 9.1* 8.9* 8.3*  HCT 31.2* 33.0* 31.1* 28.9* 28.8* 26.6*  PLT 191  --  131* 110* 111* 95*  MCV 85.5  --  83.4 83.0 83.7 82.9  MCH 26.3  --  26.5 26.1 25.9* 25.9*  MCHC 30.8  --  31.8 31.5 30.9 31.2  RDW 14.9  --  14.8 14.8 14.9 14.7  LYMPHSABS 0.5*  --  0.6*  --   --   --  MONOABS 0.1  --  0.2  --   --   --   EOSABS 0.0  --  0.1  --   --   --   BASOSABS 0.0  --  0.1  --   --   --     Chemistries   Recent Labs Lab 12/17/16 2001 12/17/16 2033 12/20/16 0647 12/20/16 1947 12/21/16 0825 12/22/16 0658  NA 135 137 128* 127* 126* 125*  K 3.7 3.9 3.8 5.4* 3.8 3.4*  CL 97* 97* 94* 98* 98* 95*  CO2 30  --  _0 GLUCOSE 91 95 93 89 85 107*  BUN _1 CREATININE 0.88 0.80 0.93 1.02 0.91 0.79  CALCIUM 8.9  --  8.2* 7.5* 7.8* 7.3*  AST 21  --  62* 139*  --  154*  ALT 13*  --  38 62  --  89*  ALKPHOS 69  --  75 76  --  82  BILITOT 1.0  --  0.6 1.3*  --  0.8   ------------------------------------------------------------------------------------------------------------------ No results for input(s): CHOL, HDL, LDLCALC, TRIG, CHOLHDL, LDLDIRECT in the last 72 hours.  No results found for: HGBA1C ------------------------------------------------------------------------------------------------------------------ No results for input(s): TSH, T4TOTAL, T3FREE, THYROIDAB in the last 72 hours.  Invalid input(s): FREET3 ------------------------------------------------------------------------------------------------------------------ No results for input(s): VITAMINB12, FOLATE, FERRITIN, TIBC, IRON, RETICCTPCT in the last 72 hours.  Coagulation profile  Recent Labs Lab 12/22/16 1125  INR 1.20    No results for input(s): DDIMER in the last 72 hours.  Cardiac Enzymes No  results for input(s): CKMB, TROPONINI, MYOGLOBIN in the last 168 hours.  Invalid input(s): CK ------------------------------------------------------------------------------------------------------------------ No results found for: BNP  Inpatient Medications  Scheduled Meds: . [START ON 12/23/2016] azithromycin  1,200 mg Oral Weekly  . doxycycline  100 mg Oral Q12H  . flucytosine  1,500 mg Oral Q6H  . ibuprofen  600 mg Oral TID  . lidocaine (PF)  5 mL Other Once  . sulfamethoxazole-trimethoprim  1 tablet Oral Daily   Continuous Infusions: . sodium chloride 100 mL/hr at 12/22/16 0546  . [START ON 12/23/2016] amphotericin  B  Liposome (AMBISOME) ADULT IV    . cefTRIAXone (ROCEPHIN)  IV Stopped (12/21/16 1652)  . metronidazole Stopped (12/22/16 1122)  . [START ON 12/23/2016] sodium chloride    . [START ON 12/23/2016] sodium chloride     PRN Meds:.acetaminophen **OR** acetaminophen, diphenhydrAMINE **OR** diphenhydrAMINE, HYDROmorphone (DILAUDID) injection, [COMPLETED] ketorolac **FOLLOWED BY** ketorolac, ondansetron **OR** ondansetron (ZOFRAN) IV, oxyCODONE, polyethylene glycol, simethicone  Micro Results Recent Results (from the past 240 hour(s))  Culture, blood (Routine X 2) w Reflex to ID Panel     Status: None (Preliminary result)   Collection Time: 12/20/16  6:42 AM  Result Value Ref Range Status   Specimen Description BLOOD LEFT ANTECUBITAL  Final   Special Requests   Final    BOTTLES DRAWN AEROBIC AND ANAEROBIC Blood Culture adequate volume   Culture   Final    NO GROWTH 1 DAY Performed at [facility], [place] [address] [place], [place] [address]    Report Status PENDING  Incomplete  Culture, blood (Routine X 2) w Reflex to ID Panel     Status: None (Preliminary result)   Collection Time: 12/20/16  6:54 AM  Result Value Ref Range Status   Specimen Description BLOOD RIGHT ANTECUBITAL  Final   Special Requests IN PEDIATRIC BOTTLE Blood Culture adequate volume  Final    Culture  Final    NO GROWTH 1 DAY Performed at [facility], [place] [address], [place], [place] [address]    Report Status PENDING  Incomplete  Surgical pcr screen     Status: None   Collection Time: 12/21/16  2:25 AM  Result Value Ref Range Status   MRSA, PCR NEGATIVE NEGATIVE Final   Staphylococcus aureus NEGATIVE NEGATIVE Final    Comment:        The Xpert SA Assay (FDA approved for NASAL specimens in patients over 77 years of age), is one component of a comprehensive surveillance program.  Test performance has been validated by [facility] for patients greater than or equal to 70 year old. It is not intended to diagnose infection nor to guide or monitor treatment.   Aerobic/Anaerobic Culture (surgical/deep wound)     Status: None (Preliminary result)   Collection Time: 12/21/16  1:39 PM  Result Value Ref Range Status   Specimen Description ABSCESS  Final   Special Requests   Final    PERIANAL PATIENT ON FOLLOWING  SEPTRA DS AND ZITHROMAX   Gram Stain   Final    FEW WBC PRESENT,BOTH PMN AND MONONUCLEAR FEW GRAM VARIABLE ROD    Culture CULTURE REINCUBATED FOR BETTER GROWTH  Final   Report Status PENDING  Incomplete    Radiology Reports Dg Chest 2 View  Result Date: 12/20/2016 CLINICAL DATA:  Abdominal pain with nausea and vomiting and shortness of breath EXAM: CHEST  2 VIEW COMPARISON:  None. FINDINGS: The heart size and mediastinal contours are within normal limits. Both lungs are clear. The visualized skeletal structures are unremarkable. IMPRESSION: No active cardiopulmonary disease. Electronically Signed   By: Inez Catalina M.D.   On: 12/20/2016 07:48   Ct Head Wo Contrast  Result Date: 12/21/2016 CLINICAL DATA:  HIV. Concern for disseminated infection or meningitis. Lymphadenopathy. Nuchal rigidity. EXAM: CT HEAD WITHOUT CONTRAST TECHNIQUE: Contiguous axial images were obtained from the base of the skull through the vertex without intravenous contrast.  COMPARISON:  Head CT 12/17/2016 FINDINGS: Brain: No mass lesion, intraparenchymal hemorrhage or extra-axial collection. No evidence of acute cortical infarct. Brain parenchyma and CSF-containing spaces are normal for age. Vascular: No hyperdense vessel or unexpected calcification. Skull: Normal visualized skull base, calvarium and extracranial soft tissues. Sinuses/Orbits: No sinus fluid levels or advanced mucosal thickening. No mastoid effusion. Normal orbits. IMPRESSION: Normal head CT. Electronically Signed   By: Ulyses Jarred M.D.   On: 12/21/2016 19:06   Ct Head Wo Contrast  Result Date: 12/17/2016 CLINICAL DATA:  Headache EXAM: CT HEAD WITHOUT CONTRAST TECHNIQUE: Contiguous axial images were obtained from the base of the skull through the vertex without intravenous contrast. COMPARISON:  None. FINDINGS: Brain: No evidence of acute infarction, hemorrhage, hydrocephalus, extra-axial collection or mass lesion/mass effect. Vascular: No hyperdense vessel or unexpected calcification. Skull: Normal. Negative for fracture or focal lesion. Sinuses/Orbits: No acute finding. Other: None IMPRESSION: No acute intracranial abnormality. Electronically Signed   By: Ashley Royalty M.D.   On: 12/17/2016 21:58   Ct Chest Wo Contrast  Result Date: 12/21/2016 CLINICAL DATA:  Evaluate for lymph node enlargement. 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 31 year old with HIV. EXAM: CT CHEST WITHOUT CONTRAST TECHNIQUE: Multidetector CT imaging of the chest was performed following the standard protocol without IV contrast. COMPARISON:  Chest radiograph 12/20/2016 FINDINGS: Cardiovascular: Normal caliber of the thoracic aorta. No gross abnormality to the pulmonary arteries. Limited evaluation of the vascular structures without intravenous contrast. Mediastinum/Nodes: Limited evaluation for  mediastinal and hilar lymphadenopathy on this noncontrast examination. There appears to be subcarinal fullness measuring up to 1.8 cm which probably  represents enlarged lymph nodes. Soft tissue fullness in the right hilum on sequence 4, image 75 measures 1.6 cm in the short axis. Multiple small lymph nodes scattered throughout the bilateral axillary regions. There appears to be multiple lymph nodes in the paratracheal region. There is low density in the anterior mediastinum which is nonspecific but could be associated with thymus. Cannot exclude right supraclavicular lymphadenopathy. Lungs/Pleura: There is trace right pleural fluid versus right pleural thickening. Trachea and mainstem bronchi are patent. Patchy areas of airspace disease in both lower lobes and both upper lobes. Small cavitary lesion in the right upper lobe measuring 6 mm on sequence 6, image 47. Scattered areas of interstitial thickening in the right lung. Upper Abdomen: Images of the upper abdomen are unremarkable. Musculoskeletal: No acute bone abnormality. IMPRESSION: Bilateral airspace disease with a small cavitary lesion in the right upper lobe. Findings compatible with pneumonia. Trace right pleural fluid versus right pleural thickening. Mediastinal and hilar lymphadenopathy. Evaluation of the lymphadenopathy is limited on this noncontrast examination. There are also prominent bilateral axillary lymph nodes. Electronically Signed   By: Markus Daft M.D.   On: 12/21/2016 21:15   Ct Abdomen Pelvis W Contrast  Result Date: 12/21/2016 CLINICAL DATA:  Known rectal abscesses, status post incision and drainage. Personal history of HIV. Initial encounter. EXAM: CT ABDOMEN AND PELVIS WITH CONTRAST TECHNIQUE: Multidetector CT imaging of the abdomen and pelvis was performed using the standard protocol following bolus administration of intravenous contrast. CONTRAST:  153m ISOVUE-300 IOPAMIDOL (ISOVUE-300) INJECTION 61% COMPARISON:  CT of the abdomen and pelvis performed 12/17/2016 FINDINGS: Lower chest: Mild right basilar airspace opacity raises concern for pneumonia. A 1.6 cm azygoesophageal  recess node is seen. Hepatobiliary: The liver is unremarkable in appearance. A stone is noted dependently within the gallbladder. The gallbladder is otherwise unremarkable. The common bile duct remains normal in caliber. Pancreas: The pancreas is within normal limits. Spleen: The spleen is unremarkable in appearance. Adrenals/Urinary Tract: The adrenal glands are unremarkable in appearance. The kidneys are within normal limits. There is no evidence of hydronephrosis. No renal or ureteral stones are identified. No perinephric stranding is seen. Stomach/Bowel: The stomach is unremarkable in appearance. The small bowel is within normal limits. The patient is status post appendectomy. Mild wall thickening is noted along the distal sigmoid colon and rectum, raising concern for proctitis. The remainder of the colon is filled with contrast and is unremarkable in appearance. Vascular/Lymphatic: The abdominal aorta is unremarkable in appearance. The inferior vena cava is grossly unremarkable. Prominent retroperitoneal nodes are seen, measuring up to 1.1 cm in short axis. Prominent nodes are also noted at both sides of the pelvis, measuring up to 1.7 cm in short axis. Enlarged bilateral inguinal nodes are seen, measuring up to 1.6 cm in short axis. Reproductive: The bladder is mildly distended and contains trace contrast. The prostate remains normal in size. Other: Trace ascites is noted about the right side of the abdomen. Diffuse presacral soft tissue inflammation is again noted. There is diffuse soft tissue edema tracking about the anorectal canal, without definite evidence of abscess. Edema is seen tracking along the right side of the gluteal cleft, similar in appearance to the prior study. This may reflect a persistent subcutaneous abscess, given stability from the prior study, though not well assessed on delayed images. A small drainage catheter is noted posterior to the  anorectal canal. Scrotal wall edema is noted,  worse on the right, with small bilateral hydroceles. Musculoskeletal: No acute osseous abnormalities are identified. The visualized musculature is unremarkable in appearance. IMPRESSION: 1. Edema tracking along the right side of the gluteal cleft is similar in appearance to the prior study. This reflected a subcutaneous abscess on the prior study. This most likely reflects a persistent small subcutaneous abscess. 2. Diffuse soft tissue edema tracking about the anorectal canal, without additional abscess. Diffuse presacral soft tissue inflammation noted. 3. Scrotal wall edema, worse on the right, with small bilateral hydroceles. 4. Mild right basilar airspace opacity raises concern for pneumonia. 5. Mild wall thickening along the distal sigmoid colon and rectum, raising concern for proctitis. 6. Enlarged azygoesophageal recess, retroperitoneal, pelvic sidewall and bilateral inguinal nodes seen, measuring up to 1.7 cm in short axis. This likely reflects the underlying infection. 7. Cholelithiasis.  Gallbladder otherwise unremarkable. Electronically Signed   By: Garald Balding M.D.   On: 12/21/2016 01:29   Ct Abdomen Pelvis W Contrast  Result Date: 12/17/2016 CLINICAL DATA:  Known rectal abscesses. Frequent vomiting past 4 days. Hypotension earlier today. HIV positive. EXAM: CT ABDOMEN AND PELVIS WITH CONTRAST TECHNIQUE: Multidetector CT imaging of the abdomen and pelvis was performed using the standard protocol following bolus administration of intravenous contrast. CONTRAST:  <See Chart> ISOVUE-300 IOPAMIDOL (ISOVUE-300) INJECTION 61% COMPARISON:  10/31/2016 FINDINGS: Lower chest: Lung bases are within normal. Hepatobiliary: Single punctate gallstone. Liver and biliary tree are within normal. Pancreas: Within normal. Spleen: Within normal. Adrenals/Urinary Tract: Adrenal glands are normal. Kidneys are normal in size without hydronephrosis or nephrolithiasis. Ureters and bladder are normal. Stomach/Bowel: Stomach  and small bowel are within normal. Appendix is within normal. Colon is unremarkable. Vascular/Lymphatic: Vascular structures are within normal. Mildly prominent inguinal lymph nodes unchanged. Reproductive: Unremarkable. Other: Slight interval decrease in size of a medial right gluteal subcutaneous abscess measuring 1 x 3.3 cm (previously 1.5 x 3.8 cm). Small caliber drain projects very superficial lesion over the lower gluteal crease just above this subcutaneous abscess. Mild wall thickening of the rectum with small round enhancing right perirectal abscess measuring 0.8 x 1.6 cm. Subtle perirectal inflammation. Few small perirectal lymph nodes are present. Slight worsening external iliac chain adenopathy with the largest node over the right iliac chain measuring 1.6 x 3.3 cm. Musculoskeletal: Within normal. IMPRESSION: Persistent rectal wall thickening and perirectal inflammation with new small right perirectal abscess measuring 0.8 x 1.6 cm. Interval decrease in size of a medial right gluteal subcutaneous abscess measuring 1 x 3.3 cm (previously 1.5 x 3.8 cm). Bilateral inguinal and iliac chain/ pelvic adenopathy slightly worse. Findings likely related to patient's ongoing infection described above as well as known HIV. Minimal cholelithiasis unchanged. These results were called by telephone at the time of interpretation on 12/17/2016 at 10:13 pm to Dr. Quintella Reichert , who verbally acknowledged these results. Electronically Signed   By: Marin Olp M.D.   On: 12/17/2016 22:14     Valiant Dills M.D on 12/22/2016 at 1:18 PM  Between 7am to 7pm - Pager - [phone]  After 7pm go to [email or web address] - password [email or web address]  [facility] -  Office  [phone]

## 2016-12-23 ENCOUNTER — Inpatient Hospital Stay (HOSPITAL_COMMUNITY): Payer: Medicaid Other

## 2016-12-23 ENCOUNTER — Encounter (HOSPITAL_COMMUNITY)

## 2016-12-23 ENCOUNTER — Encounter (HOSPITAL_COMMUNITY)
Admission: EM | Disposition: A | Discharge status: Skilled Nursing Facility | Payer: Self-pay | Source: Home / Self Care | Attending: Emergency Medicine

## 2016-12-23 DIAGNOSIS — J9600 Acute respiratory failure, unspecified whether with hypoxia or hypercapnia: Secondary | ICD-10-CM

## 2016-12-23 DIAGNOSIS — G9340 Encephalopathy, unspecified: Secondary | ICD-10-CM

## 2016-12-23 DIAGNOSIS — R6510 Systemic inflammatory response syndrome (SIRS) of non-infectious origin without acute organ dysfunction: Secondary | ICD-10-CM

## 2016-12-23 HISTORY — DX: Systemic inflammatory response syndrome (sirs) of non-infectious origin without acute organ dysfunction: R65.10

## 2016-12-23 HISTORY — DX: Encephalopathy, unspecified: G93.40

## 2016-12-23 HISTORY — DX: Acute respiratory failure, unspecified whether with hypoxia or hypercapnia: J96.00

## 2016-12-23 HISTORY — DX: Hypomagnesemia: E83.42

## 2016-12-23 LAB — BLOOD GAS, ARTERIAL
ACID-BASE DEFICIT: 4.2 mmol/L — AB (ref 0.0–2.0)
BICARBONATE: 19.6 mmol/L — AB (ref 20.0–28.0)
Drawn by: 27407
FIO2: 21
O2 SAT: 94.9 %
PATIENT TEMPERATURE: 98.6
PCO2 ART: 31.9 mmHg — AB (ref 32.0–48.0)
PO2 ART: 72.8 mmHg — AB (ref 83.0–108.0)
pH, Arterial: 7.406 (ref 7.350–7.450)

## 2016-12-23 LAB — BASIC METABOLIC PANEL
Anion gap: 7 (ref 5–15)
BUN: 10 mg/dL (ref 6–20)
CO2: 21 mmol/L — AB (ref 22–32)
Calcium: 7.5 mg/dL — ABNORMAL LOW (ref 8.9–10.3)
Chloride: 101 mmol/L (ref 101–111)
Creatinine, Ser: 1.12 mg/dL (ref 0.61–1.24)
GFR calc Af Amer: >60 mL/min (ref >60)
Glucose, Bld: 73 mg/dL (ref 65–99)
POTASSIUM: 4.7 mmol/L (ref 3.5–5.1)
Sodium: 129 mmol/L — ABNORMAL LOW (ref 135–145)

## 2016-12-23 LAB — BLOOD CULTURE ID PANEL (REFLEXED)
Acinetobacter baumannii: NOT DETECTED
CANDIDA GLABRATA: NOT DETECTED
CANDIDA KRUSEI: NOT DETECTED
CANDIDA PARAPSILOSIS: NOT DETECTED
CANDIDA TROPICALIS: NOT DETECTED
Candida albicans: NOT DETECTED
ESCHERICHIA COLI: NOT DETECTED
Enterobacter cloacae complex: NOT DETECTED
Enterobacteriaceae species: NOT DETECTED
Enterococcus species: NOT DETECTED
Haemophilus influenzae: NOT DETECTED
Klebsiella oxytoca: NOT DETECTED
Klebsiella pneumoniae: NOT DETECTED
Listeria monocytogenes: NOT DETECTED
Neisseria meningitidis: NOT DETECTED
Proteus species: NOT DETECTED
Pseudomonas aeruginosa: NOT DETECTED
SERRATIA MARCESCENS: NOT DETECTED
STAPHYLOCOCCUS AUREUS BCID: NOT DETECTED
Staphylococcus species: NOT DETECTED
Streptococcus agalactiae: NOT DETECTED
Streptococcus pneumoniae: NOT DETECTED
Streptococcus pyogenes: NOT DETECTED
Streptococcus species: NOT DETECTED

## 2016-12-23 LAB — CBC
HCT: 26.6 % — ABNORMAL LOW (ref 39.0–52.0)
HEMOGLOBIN: 8.3 g/dL — AB (ref 13.0–17.0)
MCH: 25.9 pg — ABNORMAL LOW (ref 26.0–34.0)
MCHC: 31.2 g/dL (ref 30.0–36.0)
MCV: 82.9 fL (ref 78.0–100.0)
PLATELETS: 128 10*3/uL — AB (ref 150–400)
RBC: 3.21 MIL/uL — ABNORMAL LOW (ref 4.22–5.81)
RDW: 15 % (ref 11.5–15.5)
WBC: 6.2 10*3/uL (ref 4.0–10.5)

## 2016-12-23 LAB — LACTIC ACID, PLASMA: LACTIC ACID, VENOUS: 1.3 mmol/L (ref 0.5–1.9)

## 2016-12-23 LAB — PROCALCITONIN: PROCALCITONIN: 10.72 ng/mL

## 2016-12-23 LAB — MAGNESIUM: Magnesium: 1.3 mg/dL — ABNORMAL LOW (ref 1.7–2.4)

## 2016-12-23 SURGERY — VIDEO BRONCHOSCOPY WITHOUT FLUORO
Anesthesia: Moderate Sedation | Laterality: Bilateral

## 2016-12-23 MED ORDER — MAGNESIUM SULFATE 4 GM/100ML IV SOLN
4.0000 g | Freq: Once | INTRAVENOUS | Status: AC
  Administered 2016-12-23: 4 g via INTRAVENOUS
  Filled 2016-12-23 (×2): qty 100

## 2016-12-23 MED ORDER — HYDROMORPHONE HCL 1 MG/ML IJ SOLN
0.5000 mg | INTRAMUSCULAR | Status: DC | PRN
  Administered 2016-12-23 – 2016-12-25 (×5): 0.5 mg via INTRAVENOUS
  Filled 2016-12-23 (×5): qty 1

## 2016-12-23 MED ORDER — DIPHENHYDRAMINE HCL 50 MG/ML IJ SOLN
25.0000 mg | Freq: Once | INTRAMUSCULAR | Status: AC
Start: 2016-12-23 — End: 2016-12-23
  Administered 2016-12-23: 25 mg via INTRAVENOUS
  Filled 2016-12-23: qty 1

## 2016-12-23 MED ORDER — SULFAMETHOXAZOLE-TRIMETHOPRIM 800-160 MG PO TABS
1.0000 | ORAL_TABLET | Freq: Two times a day (BID) | ORAL | Status: DC
  Administered 2016-12-23 – 2016-12-26 (×6): 1 via ORAL
  Filled 2016-12-23 (×7): qty 1

## 2016-12-23 MED ORDER — FAMOTIDINE IN NACL 20-0.9 MG/50ML-% IV SOLN
20.0000 mg | Freq: Once | INTRAVENOUS | Status: AC
  Administered 2016-12-23: 20 mg via INTRAVENOUS
  Filled 2016-12-23: qty 50

## 2016-12-23 MED ORDER — SODIUM CHLORIDE 0.9 % IV BOLUS (SEPSIS)
1000.0000 mL | Freq: Once | INTRAVENOUS | Status: AC
  Administered 2016-12-23: 1000 mL via INTRAVENOUS

## 2016-12-23 MED ORDER — SODIUM CHLORIDE 0.9 % IV SOLN
INTRAVENOUS | Status: DC
  Administered 2016-12-23 – 2016-12-25 (×2): via INTRAVENOUS

## 2016-12-23 MED ORDER — CEFTRIAXONE SODIUM 2 G IJ SOLR
2.0000 g | INTRAMUSCULAR | Status: DC
  Administered 2016-12-23 – 2016-12-27 (×5): 2 g via INTRAVENOUS
  Filled 2016-12-23 (×8): qty 2

## 2016-12-23 NOTE — Progress Notes (Addendum)
[facility] for Infectious Disease    Date of Admission:  12/20/2016   Total days of antibiotics 4        Day 2 L-ampho/flucytosine        Day 2 valcyclovir        Day 2 doxy/amox clav   ID: William Pena is a 31 y.o. 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 CSF and blood cx showing budding yeast c/w cryptococcal infection. This morning was thought to have altered mental status (which cancelled his bronch procedure) but now improved  Medications:  . amoxicillin-clavulanate  1 tablet Oral BID WC  . azithromycin  1,200 mg Oral Weekly  . doxycycline  100 mg Oral BID WC  . flucytosine  1,500 mg Oral Q6H  . ibuprofen  600 mg Oral TID  . lidocaine (PF)  5 mL Other Once  . magic mouthwash  5 mL Oral QID  . sulfamethoxazole-trimethoprim  1 tablet Oral Daily  . valACYclovir  1,000 mg Oral BID    Objective: Vital signs in last 24 hours: Temp:  [99.9 F (37.7 C)-102.1 F (38.9 C)] 100.5 F (38.1 C) (05/25 1754) Pulse Rate:  [101-119] 101 (05/25 0610) Resp:  [17-18] 17 (05/25 0610) BP: (115-132)/(63-85) 130/85 (05/25 1754) SpO2:  [95 %-97 %] 97 % (05/25 0610)  Physical Exam  Constitutional: [name] oriented to person, place, and time. He appears well-developed and well-nourished. No distress. Has newly redface HENT: + thrush, black tongue Mouth/Throat: Oropharynx is clear and moist. No oropharyngeal exudate.  Cardiovascular: Normal rate, regular rhythm and normal heart sounds. Exam reveals no gallop and no friction rub.  No murmur heard.    Pulmonary/Chest: mild bilateral rhonchi Abdominal: Soft. Bowel sounds are normal. He exhibits no distension. There is no tenderness.  Lymphadenopathy: +cervical adenopathy.  Neurological: [name] alert and oriented to person, place, and time.  Skin: erythema to face, possibly to chest Psychiatric: He has a normal mood and affect. His behavior is normal.    Lab Results  Recent Labs  12/22/16 0658 12/23/16 0507  WBC 9.3 6.2  HGB 8.3* 8.3*  HCT 26.6* 26.6*  NA 125* 129*  K 3.4* 4.7  CL 95* 101  CO2 24 21*  BUN 7 10  CREATININE 0.79 1.12   Liver Panel  Recent Labs  12/20/16 1947 12/22/16 0658  PROT 8.4* 7.7  ALBUMIN 2.6* 2.2*  AST 139* 154*  ALT 62 89*  ALKPHOS 76 82  BILITOT 1.3* 0.8    Microbiology: 5/23 or cx = mixed flora 5/22 blood cx = yeast 5/24 csf= too young to read. Yeast on gram stain CSF + crag 1:2058+ Studies/Results: Ct Head Wo Contrast  Result Date: 12/23/2016 CLINICAL DATA:  Mental status changes EXAM: CT HEAD WITHOUT CONTRAST TECHNIQUE: Contiguous axial images were obtained from the base of the skull through the vertex without intravenous contrast. COMPARISON:  None. FINDINGS: Brain: No evidence of acute infarction, hemorrhage, hydrocephalus, extra-axial collection or mass lesion/mass effect. Vascular: No hyperdense vessel or unexpected calcification. Skull: Normal. Negative for fracture or focal lesion. Sinuses/Orbits: No acute  finding. Other: None. IMPRESSION: Normal brain. Electronically Signed   By: Kerby Moors M.D.   On: 12/23/2016 12:11   Ct Head Wo Contrast  Result Date: 12/21/2016 CLINICAL DATA:  HIV. Concern for disseminated infection or meningitis. Lymphadenopathy. Nuchal rigidity. EXAM: CT HEAD WITHOUT CONTRAST TECHNIQUE: Contiguous axial images were obtained from the base of the skull through the vertex without intravenous contrast. COMPARISON:  Head CT 12/17/2016 FINDINGS: Brain: No mass lesion, intraparenchymal hemorrhage or extra-axial  collection. No evidence of acute cortical infarct. Brain parenchyma and CSF-containing spaces are normal for age. Vascular: No hyperdense vessel or unexpected calcification. Skull: Normal visualized skull base, calvarium and extracranial soft tissues. Sinuses/Orbits: No sinus fluid levels or advanced mucosal thickening. No mastoid effusion. Normal orbits. IMPRESSION: Normal head CT. Electronically Signed   By: Ulyses Jarred M.D.   On: 12/21/2016 19:06   Ct Chest Wo Contrast  Result Date: 12/21/2016 CLINICAL DATA:  Evaluate for lymph node enlargement. 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 31 year old with HIV. EXAM: CT CHEST WITHOUT CONTRAST TECHNIQUE: Multidetector CT imaging of the chest was performed following the standard protocol without IV contrast. COMPARISON:  Chest radiograph 12/20/2016 FINDINGS: Cardiovascular: Normal caliber of the thoracic aorta. No gross abnormality to the pulmonary arteries. Limited evaluation of the vascular structures without intravenous contrast. Mediastinum/Nodes: Limited evaluation for mediastinal and hilar lymphadenopathy on this noncontrast examination. There appears to be subcarinal fullness measuring up to 1.8 cm which probably represents enlarged lymph nodes. Soft tissue fullness in the right hilum on sequence 4, image 75 measures 1.6 cm in the short axis. Multiple small lymph nodes scattered throughout the bilateral axillary regions. There appears to be multiple lymph nodes in the paratracheal region. There is low density in the anterior mediastinum which is nonspecific but could be associated with thymus. Cannot exclude right supraclavicular lymphadenopathy. Lungs/Pleura: There is trace right pleural fluid versus right pleural thickening. Trachea and mainstem bronchi are patent. Patchy areas of airspace disease in both lower lobes and both upper lobes. Small cavitary lesion in the right upper lobe measuring 6 mm on sequence 6, image 47. Scattered areas of interstitial thickening  in the right lung. Upper Abdomen: Images of the upper abdomen are unremarkable. Musculoskeletal: No acute bone abnormality. IMPRESSION: Bilateral airspace disease with a small cavitary lesion in the right upper lobe. Findings compatible with pneumonia. Trace right pleural fluid versus right pleural thickening. Mediastinal and hilar lymphadenopathy. Evaluation of the lymphadenopathy is limited on this noncontrast examination. There are also prominent bilateral axillary lymph nodes. Electronically Signed   By: Markus Daft M.D.   On: 12/21/2016 21:15   Dg Chest Port 1 View  Result Date: 12/23/2016 CLINICAL DATA:  Generalized body aches 1 week. Chest pain. Short of breath. HIV positive. EXAM: PORTABLE CHEST 1 VIEW COMPARISON:  12/20/2016 FINDINGS: The heart is borderline enlarged. Central and basilar hazy opacities of developed. No pneumothorax. There is new prominence of the hilar regions. IMPRESSION: New hilar prominence and bilateral hazy airspace opacities compared with 3 days ago. Differential diagnosis includes new pulmonary edema with increased size of the hilar lymph nodes or an inflammatory process. Opportunistic infection should be considered. Electronically Signed   By: Marybelle Killings M.D.   On: 12/23/2016 09:44     Assessment/Plan: Plan: 1) CM = continue with l-ampho plus flucytosine with 548m pre and post NS infusion. His LP showed paucity for WBC in his CSF. This is a very poor prognostic factor since he would be at higher risk for IRIS for when  ART is restarted. Based on COAT study, we may consider postponing ART initiation beyond 2 wk and possibly start at 5 wk post CM treatment.   Briefly spoke about severity of illness and mortality. [name] full code per our discussion  2) genital herpes = continue with valtrex 1gm bid x 7 d  3) cavitary lung disease=  We will need to keep on airborne until can rule out mTb. It is likely cryptococcal disease since he has + in blood and in CSF. The ddx  includes cryptococcal disease, mTb, MAC. Need to touch base back with PCCM to decide when they can do bronchoscopy. Recommend to check quantiferon.   4) diffuse lymphadenopathy = it would be useful to get LN biopsy  by IR to see if also has concurrent MAC. Send tissue to path as well as AFB and fungal culture. Will check MAC PCr and MTB PCR  5) aids= poorly controlled. Holding off on ART start due to CM treatment  6) peri rectal abscess= he has rash to amox/clav so we will change to ceftriaxone. And temporarily increase bactrim to BID dosing for abscess coverage. Currently on day 4  7) oi proph = continue on bactrim and weekly azithro  8) rash = likely from amox/clav. Will d/c  Dr Tommy Medal to see patient over the weekend.  Baxter Flattery [facility] for Infectious Diseases Cell: [phone] Pager: [phone]  12/23/2016, 6:10 PM

## 2016-12-23 NOTE — Assessment & Plan Note (Signed)
On 12/23/2016 AM at [facility] noticed to have florid SIRS  Plan Will check lactate and procalcitonin Fluid bolus 1L  Rest per triad High risk decompensation - if needs ICU call CCM back

## 2016-12-23 NOTE — Progress Notes (Signed)
Patient brought to bronchoscopy suite for bronchoscopy procedure.  Upon arrival, patient was confused.  Oral temperature checked with a temperature of 102.9.  Unable to have consent signed by patient due to confusion.  MD called, came to examine patient.  MD cancelled procedure due to decreased SpO2 of 92%, and increased temperature.  He felt he was too unstable for moderate sedation.

## 2016-12-23 NOTE — Consult Note (Signed)
Name: William Pena MRN: [id number] DOB: March 15, 1986    ADMISSION DATE:  12/20/2016 CONSULTATION DATE:  5/24 REFERRING MD :  Baxter Flattery   CHIEF COMPLAINT:  Request for BAL in setting of cavitary pulmonary lesions    BRIEF  This is a 32 year old make w/ HIV/AIDS (last CD4 1 w/ VL 81K). Just started back on ART 2 months ago however he was incarcerated and reported he was not given his medications. He was admitted on 5/22 for perirectal abscess. During admission he was also noted to have HA, fever, chills, night sweats neck pain and LAN. ID was consulted and recommended imaging of chest, LP, and cultures be sent from perirectal I&D. Since that time CT chest results: "Bilateral airspace disease with a small cavitary lesion in the right upper lobe. Findings compatible with pneumonia.Trace right pleural fluid versus right pleural thickening". Also his serum cryptococcal antigenemia titer was elevated raising the possibility of disseminated cryptococcal infection. Including lung involvement. We have been asked to see by infectious disease for bronchoscopy and washings in effort to help determine if CT changes reflect mTB OR possibly pulmonary cryptococcal disease.    SIGNIFICANT EVENTS    STUDIES:  CT head 5/24: nml CT chest 5/24: Bilateral airspace disease with a small cavitary lesion in the right upper lobe. Findings compatible with pneumonia. Trace right pleural fluid versus right pleural thickening. CT abd/pelvis 5/23: 1. Edema tracking along the right side of the gluteal cleft is similar in appearance to the prior study. This reflected a subcutaneous abscess on the prior study. This most likely reflects a persistent small subcutaneous abscess. 2. Diffuse soft tissue edema tracking about the anorectal canal, without additional abscess. Diffuse presacral soft tissue inflammation noted. 3. Scrotal wall edema, worse on the right, with small bilateral Hydroceles. 4. Mild right basilar airspace  opacity raises concern for pneumonia. 5. Mild wall thickening along the distal sigmoid colon and rectum, raising concern for proctitis. 6. Enlarged azygoesophageal recess, retroperitoneal, pelvic sidewall and bilateral inguinal nodes seen, measuring up to 1.7 cm in short axis. This likely reflects the underlying infection. 7. Cholelithiasis.  Gallbladder otherwise unremarkable.  Culture data 5/23 BCX2>>> AFB 5/23>> Surgical wound culture 5/23: few wbc, few gm variable rods>>> Acid fast culture w/ reflexed sensitivities from sputum 5/23>>> afb from sputum 5/23>>>  ABX azith 5/22>>> Cefepime 5/22>>>5/23 Ceftriaxone 5/23>>> Flagyl 5/23>>> vanc 5/23>>> Amphotericin B 5/24>>> Flucytosine 5/24>>>   SUBJECTIVE/OVERNIGHT/INTERVAL HX 12/23/2016 - bronch had to be postponed yesterday (he had verbally consented yesterday) due to LP and need to be supine for several hours post LP . This AM brought down to bronch suite but noted tob e febrile 102.17F and confused and unable to consent.   VITAL SIGNS: Temp:  [97.9 F (36.6 C)-102.1 F (38.9 C)] 100.1 F (37.8 C) (05/25 0610) Pulse Rate:  [62-119] 101 (05/25 0610) Resp:  [17-19] 17 (05/25 0610) BP: (115-147)/(63-94) 123/67 (05/25 0610) SpO2:  [95 %-100 %] 97 % (05/25 0610)  PHYSICAL EXAMINATION:   General Appearance:    Looks more flushed and quieter but without distress  Head:    Normocephalic, without obvious abnormality, atraumatic  Eyes:    PERRL - yes, conjunctiva/corneas - yes      Ears:    Normal external ear canals, both ears  Nose:   NG tube - no  Throat:  ETT TUBE - no , OG tube - no  Neck:   Supple,  No enlargement/tenderness/nodules     Lungs:  Clear to auscultation bilaterally, BUT MILDLY TACHYPNEIC  Chest wall:    No deformity  Heart:    S1 and S2 normal, no murmur, CVP - no.  Pressors - no TACHYCARDIC  Abdomen:     Soft, no masses, no organomegaly  Genitalia:    Not done  Rectal:   not done  Extremities:    Extremities- intact     Skin:   Intact in exposed areas with TATOOS     Neurologic:   Sedation - none -> RASS - 0 . Moves all 4s - yes. CAM-ICU - positive for delirium . Orientation - x1+ only       PULMONARY  Recent Labs Lab 12/17/16 2033  TCO2 33    CBC  Recent Labs Lab 12/21/16 0825 12/22/16 0658 12/23/16 0507  HGB 8.9* 8.3* 8.3*  HCT 28.8* 26.6* 26.6*  WBC 6.9 9.3 6.2  PLT 111* 95* 128*    COAGULATION  Recent Labs Lab 12/22/16 1125  INR 1.20    CARDIAC  No results for input(s): TROPONINI in the last 168 hours. No results for input(s): PROBNP in the last 168 hours.   CHEMISTRY  Recent Labs Lab 12/20/16 0647 12/20/16 1947 12/21/16 0825 12/22/16 0658 12/23/16 0507  NA 128* 127* 126* 125* 129*  K 3.8 5.4* 3.8 3.4* 4.7  CL 94* 98* 98* 95* 101  CO2 _0 21*  GLUCOSE 93 89 85 107* 73  BUN _1 CREATININE 0.93 1.02 0.91 0.79 1.12  CALCIUM 8.2* 7.5* 7.8* 7.3* 7.5*  MG  --   --   --   --  1.3*   Estimated Creatinine Clearance: 86.2 mL/min (by C-G formula based on SCr of 1.12 mg/dL).   LIVER  Recent Labs Lab 12/17/16 2001 12/20/16 0647 12/20/16 1947 12/22/16 0658 12/22/16 1125  AST 21 62* 139* 154*  --   ALT 13* 38 62 89*  --   ALKPHOS 69 75 76 82  --   BILITOT 1.0 0.6 1.3* 0.8  --   PROT 9.9* 9.2* 8.4* 7.7  --   ALBUMIN 3.2* 2.9* 2.6* 2.2*  --   INR  --   --   --   --  1.20     INFECTIOUS  Recent Labs Lab 12/20/16 2149 12/20/16 2201 12/22/16 2053  LATICACIDVEN 2.33* 1.14 1.5     ENDOCRINE CBG (last 3)  No results for input(s): GLUCAP in the last 72 hours.       IMAGING x48h  - image(s) personally visualized  -   highlighted in bold Ct Head Wo Contrast  Result Date: 12/21/2016 CLINICAL DATA:  HIV. Concern for disseminated infection or meningitis. Lymphadenopathy. Nuchal rigidity. EXAM: CT HEAD WITHOUT CONTRAST TECHNIQUE: Contiguous axial images were obtained from the base of the skull through the  vertex without intravenous contrast. COMPARISON:  Head CT 12/17/2016 FINDINGS: Brain: No mass lesion, intraparenchymal hemorrhage or extra-axial collection. No evidence of acute cortical infarct. Brain parenchyma and CSF-containing spaces are normal for age. Vascular: No hyperdense vessel or unexpected calcification. Skull: Normal visualized skull base, calvarium and extracranial soft tissues. Sinuses/Orbits: No sinus fluid levels or advanced mucosal thickening. No mastoid effusion. Normal orbits. IMPRESSION: Normal head CT. Electronically Signed   By: Ulyses Jarred M.D.   On: 12/21/2016 19:06   Ct Chest Wo Contrast  Result Date: 12/21/2016 CLINICAL DATA:  Evaluate for lymph node enlargement. 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 31 year old with HIV. EXAM: CT CHEST WITHOUT CONTRAST TECHNIQUE:  Multidetector CT imaging of the chest was performed following the standard protocol without IV contrast. COMPARISON:  Chest radiograph 12/20/2016 FINDINGS: Cardiovascular: Normal caliber of the thoracic aorta. No gross abnormality to the pulmonary arteries. Limited evaluation of the vascular structures without intravenous contrast. Mediastinum/Nodes: Limited evaluation for mediastinal and hilar lymphadenopathy on this noncontrast examination. There appears to be subcarinal fullness measuring up to 1.8 cm which probably represents enlarged lymph nodes. Soft tissue fullness in the right hilum on sequence 4, image 75 measures 1.6 cm in the short axis. Multiple small lymph nodes scattered throughout the bilateral axillary regions. There appears to be multiple lymph nodes in the paratracheal region. There is low density in the anterior mediastinum which is nonspecific but could be associated with thymus. Cannot exclude right supraclavicular lymphadenopathy. Lungs/Pleura: There is trace right pleural fluid versus right pleural thickening. Trachea and mainstem bronchi are patent. Patchy areas of airspace disease in both lower lobes and  both upper lobes. Small cavitary lesion in the right upper lobe measuring 6 mm on sequence 6, image 47. Scattered areas of interstitial thickening in the right lung. Upper Abdomen: Images of the upper abdomen are unremarkable. Musculoskeletal: No acute bone abnormality. IMPRESSION: Bilateral airspace disease with a small cavitary lesion in the right upper lobe. Findings compatible with pneumonia. Trace right pleural fluid versus right pleural thickening. Mediastinal and hilar lymphadenopathy. Evaluation of the lymphadenopathy is limited on this noncontrast examination. There are also prominent bilateral axillary lymph nodes. Electronically Signed   By: Markus Daft M.D.   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  Plan Per ID and triad hospitaslit  SIRS (systemic inflammatory response syndrome) ([place]) On 12/23/2016 AM at [facility] noticed to have florid SIRS  Plan Will check lactate and procalcitonin Fluid bolus 1L  Rest per triad High risk decompensation - if needs ICU call CCM back  Acute respiratory failure with hypoxia ([place]) Has mild hypoxemia pulse ox 92% with tachypnea RR 20 on 12/23/2016  This is in setting of immune suppression and pulmonary infiltrates High risk decompensation  PLAN Check cxr Check abg Low threshold to bipap to ward off intubation   D/w Dr Irena Reichmann who will follow status and results PCCM will be  on standby. Call if needed either for bronch when stable or ccm if decompensates   .  Rest per NP/medical resident whose note is outlined above and that I agree with  The patient is critically ill with multiple organ systems failure and requires high complexity decision making for assessment and support, frequent evaluation and titration of therapies, application of advanced monitoring technologies and extensive interpretation of multiple databases.   Critical Care Time devoted to patient care services described in this note is  30  Minutes. This time reflects time of care of this signee Dr Brand Males. This critical care time does not reflect procedure time, or teaching time or supervisory time of PA/NP/Med student/Med Resident etc  but could involve care discussion time    Dr. Brand Males, M.D., [facility].C.P Pulmonary and Critical Care Medicine Staff Physician [facility] Pager: [phone], If no answer or between  15:00h - 7:00h: call [phone]  12/23/2016 9:03 AM

## 2016-12-23 NOTE — Assessment & Plan Note (Signed)
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

## 2016-12-23 NOTE — Assessment & Plan Note (Signed)
neds bronch to rule out MTb  And brought down to bronch suite  Unable to bronc 12/23/2016 in bronch suite due to risk due to development of SIRS + encephalopathy + mild hypoxemia  In addition no HCPOA at bedside to cosnent  Plan Cancel bronch ID to consider emprirc Rx Please call back for bronch when stable On other hand if decompensates and gets intubated can bronch then

## 2016-12-23 NOTE — Progress Notes (Signed)
PHARMACY - PHYSICIAN COMMUNICATION CRITICAL VALUE ALERT - BLOOD CULTURE IDENTIFICATION (BCID)  Results for orders placed or performed during the hospital encounter of 12/20/16  Blood Culture ID Panel (Reflexed) (Collected: 12/20/2016  6:54 AM)  Result Value Ref Range   Enterococcus species NOT DETECTED NOT DETECTED   Listeria monocytogenes NOT DETECTED NOT DETECTED   Staphylococcus species NOT DETECTED NOT DETECTED   Staphylococcus aureus NOT DETECTED NOT DETECTED   Streptococcus species NOT DETECTED NOT DETECTED   Streptococcus agalactiae NOT DETECTED NOT DETECTED   Streptococcus pneumoniae NOT DETECTED NOT DETECTED   Streptococcus pyogenes NOT DETECTED NOT DETECTED   Acinetobacter baumannii NOT DETECTED NOT DETECTED   Enterobacteriaceae species NOT DETECTED NOT DETECTED   Enterobacter cloacae complex NOT DETECTED NOT DETECTED   Escherichia coli NOT DETECTED NOT DETECTED   Klebsiella oxytoca NOT DETECTED NOT DETECTED   Klebsiella pneumoniae NOT DETECTED NOT DETECTED   Proteus species NOT DETECTED NOT DETECTED   Serratia marcescens NOT DETECTED NOT DETECTED   Haemophilus influenzae NOT DETECTED NOT DETECTED   Neisseria meningitidis NOT DETECTED NOT DETECTED   Pseudomonas aeruginosa NOT DETECTED NOT DETECTED   Candida albicans NOT DETECTED NOT DETECTED   Candida glabrata NOT DETECTED NOT DETECTED   Candida krusei NOT DETECTED NOT DETECTED   Candida parapsilosis NOT DETECTED NOT DETECTED   Candida tropicalis NOT DETECTED NOT DETECTED    31 year old male who is 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 He has a blood culture drawn on admission that is growing yeast.  The BCID was negative, which would be expected as Cryptococcus is not on the BCID panel.  Changes to prescribed antibiotics required: Continue liposomal Amphotericin B + flucytosine.    Norva Riffle 12/23/2016  9:16 AM

## 2016-12-23 NOTE — Progress Notes (Signed)
[facility] Progress Note  2 Days Post-Op  Subjective: CC: No complaints Patient not speaking to me much this AM, grunting yes/no.  UOP good. Febrile overnight.  Objective: Vital signs in last 24 hours: Temp:  [97.9 F (36.6 C)-102.1 F (38.9 C)] 100.1 F (37.8 C) (05/25 0610) Pulse Rate:  [62-119] 101 (05/25 0610) Resp:  [17-19] 17 (05/25 0610) BP: (115-147)/(63-94) 123/67 (05/25 0610) SpO2:  [95 %-100 %] 97 % (05/25 0610) Last BM Date: 12/21/16  Intake/Output from previous day: 05/24 0701 - 05/25 0700 In: 2728.8 [P.O.:340; I.V.:2338.8; IV Piggyback:50] Out: 1700 [Urine:1200; Emesis/NG output:500] Intake/Output this shift: No intake/output data recorded.  PE: Gen:  Alert, NAD Card:  Regular rate and rhythm Pulm:  Normal effort, clear to auscultation bilaterally Abd: Soft, non-tender, non-distended Perineal wound: wound depth 2.5 cm. Bloody drainage on packing, minimal erythema around.  Skin: warm and dry, no rashes. Multiple tattoos  Psych: A&Ox3   Lab Results:   Recent Labs  12/22/16 0658 12/23/16 0507  WBC 9.3 6.2  HGB 8.3* 8.3*  HCT 26.6* 26.6*  PLT 95* 128*   BMET  Recent Labs  12/22/16 0658 12/23/16 0507  NA 125* 129*  K 3.4* 4.7  CL 95* 101  CO2 24 21*  GLUCOSE 107* 73  BUN 7 10  CREATININE 0.79 1.12  CALCIUM 7.3* 7.5*   PT/INR  Recent Labs  12/22/16 1125  LABPROT 15.2  INR 1.20   CMP     Component Value Date/Time   NA 129 (L) 12/23/2016 0507   K 4.7 12/23/2016 0507   CL 101 12/23/2016 0507   CO2 21 (L) 12/23/2016 0507   GLUCOSE 73 12/23/2016 0507   BUN 10 12/23/2016 0507   CREATININE 1.12 12/23/2016 0507   CALCIUM 7.5 (L) 12/23/2016 0507   PROT 7.7 12/22/2016 0658   ALBUMIN 2.2 (L) 12/22/2016 0658   AST 154 (H) 12/22/2016 0658   ALT 89 (H) 12/22/2016 0658   ALKPHOS 82 12/22/2016 0658   BILITOT 0.8 12/22/2016 0658   GFRNONAA >60 12/23/2016 0507   GFRAA >60 12/23/2016 0507    Studies/Results: Ct Head Wo  Contrast  Result Date: 12/21/2016 CLINICAL DATA:  HIV. Concern for disseminated infection or meningitis. Lymphadenopathy. Nuchal rigidity. EXAM: CT HEAD WITHOUT CONTRAST TECHNIQUE: Contiguous axial images were obtained from the base of the skull through the vertex without intravenous contrast. COMPARISON:  Head CT 12/17/2016 FINDINGS: Brain: No mass lesion, intraparenchymal hemorrhage or extra-axial collection. No evidence of acute cortical infarct. Brain parenchyma and CSF-containing spaces are normal for age. Vascular: No hyperdense vessel or unexpected calcification. Skull: Normal visualized skull base, calvarium and extracranial soft tissues. Sinuses/Orbits: No sinus fluid levels or advanced mucosal thickening. No mastoid effusion. Normal orbits. IMPRESSION: Normal head CT. Electronically Signed   By: Ulyses Jarred M.D.   On: 12/21/2016 19:06   Ct Chest Wo Contrast  Result Date: 12/21/2016 CLINICAL DATA:  Evaluate for lymph node enlargement. 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 31 year old with HIV. EXAM: CT CHEST WITHOUT CONTRAST TECHNIQUE: Multidetector CT imaging of the chest was performed following the standard protocol without IV contrast. COMPARISON:  Chest radiograph 12/20/2016 FINDINGS: Cardiovascular: Normal caliber of the thoracic aorta. No gross abnormality to the pulmonary arteries. Limited evaluation of the vascular structures without intravenous contrast. Mediastinum/Nodes: Limited evaluation for mediastinal and hilar lymphadenopathy on this noncontrast examination. There appears to be subcarinal fullness measuring up to 1.8 cm which probably represents enlarged lymph nodes. Soft tissue fullness in the right hilum on sequence  4, image 75 measures 1.6 cm in the short axis. Multiple small lymph nodes scattered throughout the bilateral axillary regions. There appears to be multiple lymph nodes in the paratracheal region. There is low density in the anterior mediastinum which is nonspecific but could be  associated with thymus. Cannot exclude right supraclavicular lymphadenopathy. Lungs/Pleura: There is trace right pleural fluid versus right pleural thickening. Trachea and mainstem bronchi are patent. Patchy areas of airspace disease in both lower lobes and both upper lobes. Small cavitary lesion in the right upper lobe measuring 6 mm on sequence 6, image 47. Scattered areas of interstitial thickening in the right lung. Upper Abdomen: Images of the upper abdomen are unremarkable. Musculoskeletal: No acute bone abnormality. IMPRESSION: Bilateral airspace disease with a small cavitary lesion in the right upper lobe. Findings compatible with pneumonia. Trace right pleural fluid versus right pleural thickening. Mediastinal and hilar lymphadenopathy. Evaluation of the lymphadenopathy is limited on this noncontrast examination. There are also prominent bilateral axillary lymph nodes. Electronically Signed   By: Markus Daft M.D.   On: 12/21/2016 21:15    Anti-infectives: Anti-infectives    Start     Dose/Rate Route Frequency Ordered Stop   12/23/16 0900  amphotericin B liposome (AMBISOME) 200 mg in dextrose 5 % 500 mL IVPB     200 mg 250 mL/hr over 120 Minutes Intravenous Every 24 hours 12/22/16 0919     12/23/16 0800  azithromycin (ZITHROMAX) tablet 1,200 mg     1,200 mg Oral Weekly 12/22/16 0811     12/22/16 2200  valACYclovir (VALTREX) tablet 1,000 mg     1,000 mg Oral 2 times daily 12/22/16 1611 12/29/16 2159   12/22/16 1800  amoxicillin-clavulanate (AUGMENTIN) 875-125 MG per tablet 1 tablet     1 tablet Oral 2 times daily with meals 12/22/16 1446     12/22/16 1800  doxycycline (VIBRA-TABS) tablet 100 mg     100 mg Oral 2 times daily with meals 12/22/16 1446     12/22/16 1200  flucytosine (ANCOBON) capsule 1,500 mg     1,500 mg Oral Every 6 hours 12/22/16 0614     12/22/16 1000  azithromycin (ZITHROMAX) tablet 250 mg  Status:  Discontinued     250 mg Oral Daily 12/21/16 0224 12/21/16 1131   12/22/16  1000  doxycycline (VIBRA-TABS) tablet 100 mg  Status:  Discontinued     100 mg Oral Every 12 hours 12/22/16 0942 12/22/16 1446   12/22/16 0000  amphotericin B liposome (AMBISOME) 190 mg in dextrose 5 % 500 mL IVPB  Status:  Discontinued     3 mg/kg  64.2 kg 250 mL/hr over 120 Minutes Intravenous Every 24 hours 12/21/16 2313 12/22/16 0919   12/22/16 0000  flucytosine (ANCOBON) capsule 250 mg  Status:  Discontinued     250 mg Oral Every 6 hours 12/21/16 2314 12/21/16 2325   12/22/16 0000  flucytosine (ANCOBON) capsule 1,500 mg  Status:  Discontinued     1,500 mg Oral Every 6 hours 12/21/16 2326 12/22/16 0614   12/21/16 1645  cefTRIAXone (ROCEPHIN) 2 g in dextrose 5 % 50 mL IVPB  Status:  Discontinued     2 g 100 mL/hr over 30 Minutes Intravenous Every 24 hours 12/21/16 1539 12/22/16 1446   12/21/16 1645  metroNIDAZOLE (FLAGYL) IVPB 500 mg  Status:  Discontinued     500 mg 100 mL/hr over 60 Minutes Intravenous Every 8 hours 12/21/16 1539 12/21/16 2348   12/21/16 1600  cefTRIAXone (ROCEPHIN) 2  g in dextrose 5 % 50 mL IVPB  Status:  Discontinued     2 g 100 mL/hr over 30 Minutes Intravenous Every 24 hours 12/21/16 1131 12/21/16 1558   12/21/16 1400  vancomycin (VANCOCIN) IVPB 750 mg/150 ml premix  Status:  Discontinued     750 mg 150 mL/hr over 60 Minutes Intravenous Every 8 hours 12/21/16 0142 12/22/16 0942   12/21/16 1000  sulfamethoxazole-trimethoprim (BACTRIM DS,SEPTRA DS) 800-160 MG per tablet 1 tablet  Status:  Discontinued     1 tablet Oral Daily 12/21/16 0058 12/21/16 0100   12/21/16 1000  abacavir-dolutegravir-lamiVUDine (TRIUMEQ) 600-50-300 MG per tablet 1 tablet  Status:  Discontinued     1 tablet Oral Daily 12/21/16 0204 12/22/16 0903   12/21/16 1000  sulfamethoxazole-trimethoprim (BACTRIM DS,SEPTRA DS) 800-160 MG per tablet 1 tablet     1 tablet Oral Daily 12/21/16 0204     12/21/16 0800  ceFEPIme (MAXIPIME) 2 g in dextrose 5 % 50 mL IVPB  Status:  Discontinued     2 g 100 mL/hr  over 30 Minutes Intravenous Every 8 hours 12/21/16 0137 12/21/16 1131   12/21/16 0600  piperacillin-tazobactam (ZOSYN) IVPB 3.375 g  Status:  Discontinued     3.375 g 12.5 mL/hr over 240 Minutes Intravenous Every 8 hours 12/21/16 0058 12/21/16 0132   12/21/16 0200  metroNIDAZOLE (FLAGYL) IVPB 500 mg  Status:  Discontinued     500 mg 100 mL/hr over 60 Minutes Intravenous Every 8 hours 12/21/16 0137 12/22/16 1446   12/21/16 0145  ceFEPIme (MAXIPIME) 2 g in dextrose 5 % 50 mL IVPB     2 g 100 mL/hr over 30 Minutes Intravenous  Once 12/21/16 0137 12/21/16 0358   12/21/16 0145  vancomycin (VANCOCIN) IVPB 1000 mg/200 mL premix     1,000 mg 200 mL/hr over 60 Minutes Intravenous  Once 12/21/16 0142 12/21/16 0429   12/21/16 0130  piperacillin-tazobactam (ZOSYN) IVPB 3.375 g  Status:  Discontinued     3.375 g 100 mL/hr over 30 Minutes Intravenous  Once 12/21/16 0115 12/21/16 0132   12/21/16 0130  azithromycin (ZITHROMAX) powder 1 g     1 g Oral  Once 12/21/16 0125 12/21/16 0328      Assessment/Plan Perirectal abscess S/p I&D 5/23 Dr Rosendo Gros - POD#2 - packing removed and wound with moderate bloody drainage - repacked wound loosely with iodoform gauze - dressing changes BID or if contaminated - continue abx per ID  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

## 2016-12-23 NOTE — Progress Notes (Signed)
Consult  PROGRESS NOTE                                                                                                                                                                                                             Patient Demographics:    William Pena, is a 31 y.o. male, DOB - May 17, 1986, FUX:[id number]  Admit date - 12/20/2016   Admitting Physician Md Edison Pace, MD  Outpatient Primary MD for the patient is [name], [name], MD  LOS - 2   Chief Complaint  Patient presents with  . Rectal Bleeding  . 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  [facility] hospitalist took over primary management on 5/24 given significant comorbidities.   Subjective:    William Pena today Appears to be confused, unable to answer questions, no significant events overnight, a bronchoscopy and felt ill yesterday given tenuous respiratory status.   Assessment  & Plan :    Principal Problem:   Perirectal abscess Active Problems:   Chronic pain   AIDS (acquired immune deficiency syndrome) (HCC)   Hyponatremia   Normocytic anemia   Elevated LFTs   Pulmonary infiltrates   Hypomagnesemia   Encephalopathy acute   SIRS (systemic inflammatory response syndrome) (HCC)   Acute respiratory failure with hypoxia (HCC)   Perirectal abscess - Status post I&D 5/23 by Dr. Rosendo Gros, wound care per surgical team, initially on vancomycin and cefepime, Currently on Augmentin and  doxycycline.  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 - Hepatic management per ID, continue with Bactrim daily, and azithromycin weekly - Patient with diffuse lymphadenopathy, and 4 IR biopsy of lymph node to rule out MAC. 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 - high risk for adrenal insufficiency given disseminated disease,follow  ACTH stimulation  test  Anemia: - Normocytic, stable.    Elevated LFTs: - Mild.Trend  Pneumonia - CT chest with right upper small cavitary lesions, suspicion for mTB vs. MAC vs Crypto lung involvement - Bronchoscopy has been canceled today given fever, tachypnea.  Acute encephalopathy - Lethargic earlier today, CT head with no acute finding, ABG with no evidence of CO2 retention, mental status currently back to baseline, I think this is most likely related to narcotics, I will decrease his IV Dilaudid.   Code Status : Full  Family Communication  : D/W patient  Consults : Surgery, ID, IR, PCCM  Procedures  : LP 2/23  DVT Prophylaxis  : SCDs, will resume on lovenox after LN biopsy.  Lab Results  Component Value Date   PLT 128 (L) 12/23/2016    Antibiotics  :    Anti-infectives    Start     Dose/Rate Route Frequency Ordered Stop   12/23/16 0900  amphotericin B  liposome (AMBISOME) 200 mg in dextrose 5 % 500 mL IVPB     200 mg 250 mL/hr over 120 Minutes Intravenous Every 24 hours 12/22/16 0919     12/23/16 0800  azithromycin (ZITHROMAX) tablet 1,200 mg     1,200 mg Oral Weekly 12/22/16 0811     12/22/16 2200  valACYclovir (VALTREX) tablet 1,000 mg     1,000 mg Oral 2 times daily 12/22/16 1611 12/29/16 2159   12/22/16 1800  amoxicillin-clavulanate (AUGMENTIN) 875-125 MG per tablet 1 tablet     1 tablet Oral 2 times daily with meals 12/22/16 1446     12/22/16 1800  doxycycline (VIBRA-TABS) tablet 100 mg     100 mg Oral 2 times daily with meals 12/22/16 1446     12/22/16 1200  flucytosine (ANCOBON) capsule 1,500 mg     1,500 mg Oral Every 6 hours 12/22/16 0614     12/22/16 1000  azithromycin (ZITHROMAX) tablet 250 mg  Status:  Discontinued     250 mg Oral Daily 12/21/16 0224 12/21/16 1131   12/22/16 1000  doxycycline (VIBRA-TABS) tablet 100 mg  Status:  Discontinued     100 mg Oral Every 12 hours 12/22/16 0942 12/22/16 1446   12/22/16 0000  amphotericin B liposome (AMBISOME) 190 mg in dextrose 5 % 500 mL IVPB  Status:  Discontinued     3 mg/kg  64.2 kg 250 mL/hr over 120 Minutes Intravenous Every 24 hours 12/21/16 2313 12/22/16 0919   12/22/16 0000  flucytosine (ANCOBON) capsule 250 mg  Status:  Discontinued     250 mg Oral Every 6 hours 12/21/16 2314 12/21/16 2325   12/22/16 0000  flucytosine (ANCOBON) capsule 1,500 mg  Status:  Discontinued     1,500 mg Oral Every 6 hours 12/21/16 2326 12/22/16 0614   12/21/16 1645  cefTRIAXone (ROCEPHIN) 2 g in dextrose 5 % 50 mL IVPB  Status:  Discontinued     2 g 100 mL/hr over 30 Minutes Intravenous Every 24 hours 12/21/16 1539  12/22/16 1446   12/21/16 1645  metroNIDAZOLE (FLAGYL) IVPB 500 mg  Status:  Discontinued     500 mg 100 mL/hr over 60 Minutes Intravenous Every 8 hours 12/21/16 1539 12/21/16 2348   12/21/16 1600  cefTRIAXone (ROCEPHIN) 2 g in dextrose 5 % 50 mL IVPB  Status:  Discontinued     2  g 100 mL/hr over 30 Minutes Intravenous Every 24 hours 12/21/16 1131 12/21/16 1558   12/21/16 1400  vancomycin (VANCOCIN) IVPB 750 mg/150 ml premix  Status:  Discontinued     750 mg 150 mL/hr over 60 Minutes Intravenous Every 8 hours 12/21/16 0142 12/22/16 0942   12/21/16 1000  sulfamethoxazole-trimethoprim (BACTRIM DS,SEPTRA DS) 800-160 MG per tablet 1 tablet  Status:  Discontinued     1 tablet Oral Daily 12/21/16 0058 12/21/16 0100   12/21/16 1000  abacavir-dolutegravir-lamiVUDine (TRIUMEQ) 600-50-300 MG per tablet 1 tablet  Status:  Discontinued     1 tablet Oral Daily 12/21/16 0204 12/22/16 0903   12/21/16 1000  sulfamethoxazole-trimethoprim (BACTRIM DS,SEPTRA DS) 800-160 MG per tablet 1 tablet     1 tablet Oral Daily 12/21/16 0204     12/21/16 0800  ceFEPIme (MAXIPIME) 2 g in dextrose 5 % 50 mL IVPB  Status:  Discontinued     2 g 100 mL/hr over 30 Minutes Intravenous Every 8 hours 12/21/16 0137 12/21/16 1131   12/21/16 0600  piperacillin-tazobactam (ZOSYN) IVPB 3.375 g  Status:  Discontinued     3.375 g 12.5 mL/hr over 240 Minutes Intravenous Every 8 hours 12/21/16 0058 12/21/16 0132   12/21/16 0200  metroNIDAZOLE (FLAGYL) IVPB 500 mg  Status:  Discontinued     500 mg 100 mL/hr over 60 Minutes Intravenous Every 8 hours 12/21/16 0137 12/22/16 1446   12/21/16 0145  ceFEPIme (MAXIPIME) 2 g in dextrose 5 % 50 mL IVPB     2 g 100 mL/hr over 30 Minutes Intravenous  Once 12/21/16 0137 12/21/16 0358   12/21/16 0145  vancomycin (VANCOCIN) IVPB 1000 mg/200 mL premix     1,000 mg 200 mL/hr over 60 Minutes Intravenous  Once 12/21/16 0142 12/21/16 0429   12/21/16 0130  piperacillin-tazobactam (ZOSYN) IVPB 3.375 g  Status:  Discontinued     3.375 g 100 mL/hr over 30 Minutes Intravenous  Once 12/21/16 0115 12/21/16 0132   12/21/16 0130  azithromycin (ZITHROMAX) powder 1 g     1 g Oral  Once 12/21/16 0125 12/21/16 0328        Objective:   Vitals:   12/22/16 1357 12/22/16 2026 12/22/16 2203  12/23/16 0610  BP: (!) 129/94 132/78 115/63 123/67  Pulse: 63 (!) 119 (!) 108 (!) 101  Resp: 19 18  17   Temp: 97.9 F (36.6 C) (!) 102.1 F (38.9 C) 99.9 F (37.7 C) 100.1 F (37.8 C)  TempSrc: Oral Oral Oral Oral  SpO2: 98% 95% 95% 97%  Weight:      Height:        Wt Readings from Last 3 Encounters:  12/21/16 64.2 kg (141 lb 8 oz)  12/20/16 65.8 kg (145 lb)  12/17/16 65.8 kg (145 lb)     Intake/Output Summary (Last 24 hours) at 12/23/16 1326 Last data filed at 12/23/16 1215  Gross per 24 hour  Intake          2608.75 ml  Output             1250 ml  Net  1358.75 ml     Physical Exam  confused, lethargic  but wakes up, and follow commands  oral thrush present, cervical lymphadenopathy Good air entry bilaterally, metatarsus muscle  S1, S2 Heard, RRR, no rubs murmurs gallob Abdomen soft, nontender nondistended no edema, clubbing or cyanosis     Data Review:    CBC  Recent Labs Lab 12/17/16 2001  12/20/16 0647 12/20/16 1947 12/21/16 0825 12/22/16 0658 12/23/16 0507  WBC 6.3  --  6.9 6.8 6.9 9.3 6.2  HGB 9.6*  < > 9.9* 9.1* 8.9* 8.3* 8.3*  HCT 31.2*  < > 31.1* 28.9* 28.8* 26.6* 26.6*  PLT 191  --  131* 110* 111* 95* 128*  MCV 85.5  --  83.4 83.0 83.7 82.9 82.9  MCH 26.3  --  26.5 26.1 25.9* 25.9* 25.9*  MCHC 30.8  --  31.8 31.5 30.9 31.2 31.2  RDW 14.9  --  14.8 14.8 14.9 14.7 15.0  LYMPHSABS 0.5*  --  0.6*  --   --   --   --   MONOABS 0.1  --  0.2  --   --   --   --   EOSABS 0.0  --  0.1  --   --   --   --   BASOSABS 0.0  --  0.1  --   --   --   --   < > = values in this interval not displayed.  Chemistries   Recent Labs Lab 12/17/16 2001  12/20/16 0647 12/20/16 1947 12/21/16 0825 12/22/16 0658 12/23/16 0507  NA 135  < > 128* 127* 126* 125* 129*  K 3.7  < > 3.8 5.4* 3.8 3.4* 4.7  CL 97*  < > 94* 98* 98* 95* 101  CO2 30  --  25 24 24 24  21*  GLUCOSE 91  < > 93 89 85 107* 73  BUN 17  < > 16 13 9 7 10   CREATININE 0.88  < > 0.93  1.02 0.91 0.79 1.12  CALCIUM 8.9  --  8.2* 7.5* 7.8* 7.3* 7.5*  MG  --   --   --   --   --   --  1.3*  AST 21  --  62* 139*  --  154*  --   ALT 13*  --  38 62  --  89*  --   ALKPHOS 69  --  75 76  --  82  --   BILITOT 1.0  --  0.6 1.3*  --  0.8  --   < > = values in this interval not displayed. ------------------------------------------------------------------------------------------------------------------ No results for input(s): CHOL, HDL, LDLCALC, TRIG, CHOLHDL, LDLDIRECT in the last 72 hours.  No results found for: HGBA1C ------------------------------------------------------------------------------------------------------------------ No results for input(s): TSH, T4TOTAL, T3FREE, THYROIDAB in the last 72 hours.  Invalid input(s): FREET3 ------------------------------------------------------------------------------------------------------------------ No results for input(s): VITAMINB12, FOLATE, FERRITIN, TIBC, IRON, RETICCTPCT in the last 72 hours.  Coagulation profile  Recent Labs Lab 12/22/16 1125  INR 1.20    No results for input(s): DDIMER in the last 72 hours.  Cardiac Enzymes No results for input(s): CKMB, TROPONINI, MYOGLOBIN in the last 168 hours.  Invalid input(s): CK ------------------------------------------------------------------------------------------------------------------ No results found for: BNP  Inpatient Medications  Scheduled Meds: . amoxicillin-clavulanate  1 tablet Oral BID WC  . azithromycin  1,200 mg Oral Weekly  . doxycycline  100 mg Oral BID WC  . flucytosine  1,500 mg Oral Q6H  . ibuprofen  600 mg  Oral TID  . lidocaine (PF)  5 mL Other Once  . magic mouthwash  5 mL Oral QID  . sulfamethoxazole-trimethoprim  1 tablet Oral Daily  . valACYclovir  1,000 mg Oral BID   Continuous Infusions: . sodium chloride 100 mL/hr at 12/23/16 0449  . amphotericin  B  Liposome (AMBISOME) ADULT IV Stopped (12/23/16 1242)  . magnesium sulfate 1 - 4 g  bolus IVPB 4 g (12/23/16 1208)  . sodium chloride Stopped (12/23/16 1030)  . sodium chloride 500 mL (12/23/16 1243)   PRN Meds:.acetaminophen **OR** acetaminophen, diphenhydrAMINE **OR** diphenhydrAMINE, HYDROmorphone (DILAUDID) injection, [COMPLETED] ketorolac **FOLLOWED BY** ketorolac, ondansetron **OR** ondansetron (ZOFRAN) IV, oxyCODONE, polyethylene glycol, simethicone  Micro Results Recent Results (from the past 240 hour(s))  Culture, blood (Routine X 2) w Reflex to ID Panel     Status: None (Preliminary result)   Collection Time: 12/20/16  6:42 AM  Result Value Ref Range Status   Specimen Description BLOOD LEFT ANTECUBITAL  Final   Special Requests   Final    BOTTLES DRAWN AEROBIC AND ANAEROBIC Blood Culture adequate volume   Culture   Final    NO GROWTH 2 DAYS Performed at [facility], [place] [address] [place], [place] [address]    Report Status PENDING  Incomplete  Culture, blood (Routine X 2) w Reflex to ID Panel     Status: None (Preliminary result)   Collection Time: 12/20/16  6:54 AM  Result Value Ref Range Status   Specimen Description BLOOD RIGHT ANTECUBITAL  Final   Special Requests IN PEDIATRIC BOTTLE Blood Culture adequate volume  Final   Culture  Setup Time   Final    IN PEDIATRIC BOTTLE YEAST Organism ID to follow CRITICAL RESULT CALLED TO, READ BACK BY AND VERIFIED WITHJosefine Class, AT [id number] 12/23/16 BY Rush Landmark Performed at [facility], [place] [address], [place], [place] [address]    Culture YEAST  Final   Report Status PENDING  Incomplete  Blood Culture ID Panel (Reflexed)     Status: None   Collection Time: 12/20/16  6:54 AM  Result Value Ref Range Status   Enterococcus species NOT DETECTED NOT DETECTED Final   Listeria monocytogenes NOT DETECTED NOT DETECTED Final   Staphylococcus species NOT DETECTED NOT DETECTED Final   Staphylococcus aureus NOT DETECTED NOT DETECTED Final   Streptococcus species NOT DETECTED NOT DETECTED Final    Streptococcus agalactiae NOT DETECTED NOT DETECTED Final   Streptococcus pneumoniae NOT DETECTED NOT DETECTED Final   Streptococcus pyogenes NOT DETECTED NOT DETECTED Final   Acinetobacter baumannii NOT DETECTED NOT DETECTED Final   Enterobacteriaceae species NOT DETECTED NOT DETECTED Final   Enterobacter cloacae complex NOT DETECTED NOT DETECTED Final   Escherichia coli NOT DETECTED NOT DETECTED Final   Klebsiella oxytoca NOT DETECTED NOT DETECTED Final   Klebsiella pneumoniae NOT DETECTED NOT DETECTED Final   Proteus species NOT DETECTED NOT DETECTED Final   Serratia marcescens NOT DETECTED NOT DETECTED Final   Haemophilus influenzae NOT DETECTED NOT DETECTED Final   Neisseria meningitidis NOT DETECTED NOT DETECTED Final   Pseudomonas aeruginosa NOT DETECTED NOT DETECTED Final   Candida albicans NOT DETECTED NOT DETECTED Final   Candida glabrata NOT DETECTED NOT DETECTED Final   Candida krusei NOT DETECTED NOT DETECTED Final   Candida parapsilosis NOT DETECTED NOT DETECTED Final   Candida tropicalis NOT DETECTED NOT DETECTED Final    Comment: Performed at [facility], [place]  [address] [place], [place] [address]  Surgical pcr screen     Status: None   Collection Time: 12/21/16  2:25 AM  Result Value Ref Range Status   MRSA, PCR NEGATIVE NEGATIVE Final   Staphylococcus aureus NEGATIVE NEGATIVE Final    Comment:        The Xpert SA Assay (FDA approved for NASAL specimens in patients over 53 years of age), is one component of a comprehensive surveillance program.  Test performance has been validated by [facility] for patients greater than or equal to 42 year old. It is not intended to diagnose infection nor to guide or monitor treatment.   Blood culture (routine x 2)     Status: None (Preliminary result)   Collection Time: 12/21/16  4:11 AM  Result Value Ref Range Status   Specimen Description BLOOD LEFT ANTECUBITAL  Final   Special Requests   Final    BOTTLES DRAWN  AEROBIC ONLY Blood Culture adequate volume   Culture NO GROWTH 1 DAY  Final   Report Status PENDING  Incomplete  Blood culture (routine x 2)     Status: None (Preliminary result)   Collection Time: 12/21/16  4:11 AM  Result Value Ref Range Status   Specimen Description BLOOD RIGHT HAND  Final   Special Requests IN PEDIATRIC BOTTLE Blood Culture adequate volume  Final   Culture NO GROWTH 1 DAY  Final   Report Status PENDING  Incomplete  Aerobic/Anaerobic Culture (surgical/deep wound)     Status: None (Preliminary result)   Collection Time: 12/21/16  1:39 PM  Result Value Ref Range Status   Specimen Description ABSCESS  Final   Special Requests   Final    PERIANAL PATIENT ON FOLLOWING  SEPTRA DS AND ZITHROMAX   Gram Stain   Final    FEW WBC PRESENT,BOTH PMN AND MONONUCLEAR FEW GRAM VARIABLE ROD    Culture CULTURE REINCUBATED FOR BETTER GROWTH  Final   Report Status PENDING  Incomplete  CSF culture with Stat gram stain     Status: None (Preliminary result)   Collection Time: 12/22/16  9:29 AM  Result Value Ref Range Status   Specimen Description BACK  Final   Special Requests Immunocompromised  Final   Gram Stain   Final    CYTOSPIN SMEAR WBC PRESENT, PREDOMINANTLY MONONUCLEAR YEAST CRITICAL RESULT CALLED TO, READ BACK BY AND VERIFIED WITH: RN R BUTLER [phone] 65 MLM    Culture TOO YOUNG TO READ  Final   Report Status PENDING  Incomplete  Culture, fungus without smear     Status: None (Preliminary result)   Collection Time: 12/22/16  9:51 AM  Result Value Ref Range Status   Specimen Description CSF  Final   Special Requests Immunocompromised  Final   Culture NO FUNGUS ISOLATED AFTER 1 DAY  Final   Report Status PENDING  Incomplete    Radiology Reports Dg Chest 2 View  Result Date: 12/20/2016 CLINICAL DATA:  Abdominal pain with nausea and vomiting and shortness of breath EXAM: CHEST  2 VIEW COMPARISON:  None. FINDINGS: The heart size and mediastinal contours are within  normal limits. Both lungs are clear. The visualized skeletal structures are unremarkable. IMPRESSION: No active cardiopulmonary disease. Electronically Signed   By: Inez Catalina M.D.   On: 12/20/2016 07:48   Ct Head Wo Contrast  Result Date: 12/23/2016 CLINICAL DATA:  Mental status changes EXAM: CT HEAD WITHOUT CONTRAST TECHNIQUE: Contiguous axial images were obtained from the base of the skull through  the vertex without intravenous contrast. COMPARISON:  None. FINDINGS: Brain: No evidence of acute infarction, hemorrhage, hydrocephalus, extra-axial collection or mass lesion/mass effect. Vascular: No hyperdense vessel or unexpected calcification. Skull: Normal. Negative for fracture or focal lesion. Sinuses/Orbits: No acute finding. Other: None. IMPRESSION: Normal brain. Electronically Signed   By: Kerby Moors M.D.   On: 12/23/2016 12:11   Ct Head Wo Contrast  Result Date: 12/21/2016 CLINICAL DATA:  HIV. Concern for disseminated infection or meningitis. Lymphadenopathy. Nuchal rigidity. EXAM: CT HEAD WITHOUT CONTRAST TECHNIQUE: Contiguous axial images were obtained from the base of the skull through the vertex without intravenous contrast. COMPARISON:  Head CT 12/17/2016 FINDINGS: Brain: No mass lesion, intraparenchymal hemorrhage or extra-axial collection. No evidence of acute cortical infarct. Brain parenchyma and CSF-containing spaces are normal for age. Vascular: No hyperdense vessel or unexpected calcification. Skull: Normal visualized skull base, calvarium and extracranial soft tissues. Sinuses/Orbits: No sinus fluid levels or advanced mucosal thickening. No mastoid effusion. Normal orbits. IMPRESSION: Normal head CT. Electronically Signed   By: Ulyses Jarred M.D.   On: 12/21/2016 19:06   Ct Head Wo Contrast  Result Date: 12/17/2016 CLINICAL DATA:  Headache EXAM: CT HEAD WITHOUT CONTRAST TECHNIQUE: Contiguous axial images were obtained from the base of the skull through the vertex without  intravenous contrast. COMPARISON:  None. FINDINGS: Brain: No evidence of acute infarction, hemorrhage, hydrocephalus, extra-axial collection or mass lesion/mass effect. Vascular: No hyperdense vessel or unexpected calcification. Skull: Normal. Negative for fracture or focal lesion. Sinuses/Orbits: No acute finding. Other: None IMPRESSION: No acute intracranial abnormality. Electronically Signed   By: Ashley Royalty M.D.   On: 12/17/2016 21:58   Ct Chest Wo Contrast  Result Date: 12/21/2016 CLINICAL DATA:  Evaluate for lymph node enlargement. 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 31 year old with HIV. EXAM: CT CHEST WITHOUT CONTRAST TECHNIQUE: Multidetector CT imaging of the chest was performed following the standard protocol without IV contrast. COMPARISON:  Chest radiograph 12/20/2016 FINDINGS: Cardiovascular: Normal caliber of the thoracic aorta. No gross abnormality to the pulmonary arteries. Limited evaluation of the vascular structures without intravenous contrast. Mediastinum/Nodes: Limited evaluation for mediastinal and hilar lymphadenopathy on this noncontrast examination. There appears to be subcarinal fullness measuring up to 1.8 cm which probably represents enlarged lymph nodes. Soft tissue fullness in the right hilum on sequence 4, image 75 measures 1.6 cm in the short axis. Multiple small lymph nodes scattered throughout the bilateral axillary regions. There appears to be multiple lymph nodes in the paratracheal region. There is low density in the anterior mediastinum which is nonspecific but could be associated with thymus. Cannot exclude right supraclavicular lymphadenopathy. Lungs/Pleura: There is trace right pleural fluid versus right pleural thickening. Trachea and mainstem bronchi are patent. Patchy areas of airspace disease in both lower lobes and both upper lobes. Small cavitary lesion in the right upper lobe measuring 6 mm on sequence 6, image 47. Scattered areas of interstitial thickening in the  right lung. Upper Abdomen: Images of the upper abdomen are unremarkable. Musculoskeletal: No acute bone abnormality. IMPRESSION: Bilateral airspace disease with a small cavitary lesion in the right upper lobe. Findings compatible with pneumonia. Trace right pleural fluid versus right pleural thickening. Mediastinal and hilar lymphadenopathy. Evaluation of the lymphadenopathy is limited on this noncontrast examination. There are also prominent bilateral axillary lymph nodes. Electronically Signed   By: Markus Daft M.D.   On: 12/21/2016 21:15   Ct Abdomen Pelvis W Contrast  Result Date: 12/21/2016 CLINICAL DATA:  Known rectal abscesses, status post incision  and drainage. Personal history of HIV. Initial encounter. EXAM: CT ABDOMEN AND PELVIS WITH CONTRAST TECHNIQUE: Multidetector CT imaging of the abdomen and pelvis was performed using the standard protocol following bolus administration of intravenous contrast. CONTRAST:  170m ISOVUE-300 IOPAMIDOL (ISOVUE-300) INJECTION 61% COMPARISON:  CT of the abdomen and pelvis performed 12/17/2016 FINDINGS: Lower chest: Mild right basilar airspace opacity raises concern for pneumonia. A 1.6 cm azygoesophageal recess node is seen. Hepatobiliary: The liver is unremarkable in appearance. A stone is noted dependently within the gallbladder. The gallbladder is otherwise unremarkable. The common bile duct remains normal in caliber. Pancreas: The pancreas is within normal limits. Spleen: The spleen is unremarkable in appearance. Adrenals/Urinary Tract: The adrenal glands are unremarkable in appearance. The kidneys are within normal limits. There is no evidence of hydronephrosis. No renal or ureteral stones are identified. No perinephric stranding is seen. Stomach/Bowel: The stomach is unremarkable in appearance. The small bowel is within normal limits. The patient is status post appendectomy. Mild wall thickening is noted along the distal sigmoid colon and rectum, raising concern  for proctitis. The remainder of the colon is filled with contrast and is unremarkable in appearance. Vascular/Lymphatic: The abdominal aorta is unremarkable in appearance. The inferior vena cava is grossly unremarkable. Prominent retroperitoneal nodes are seen, measuring up to 1.1 cm in short axis. Prominent nodes are also noted at both sides of the pelvis, measuring up to 1.7 cm in short axis. Enlarged bilateral inguinal nodes are seen, measuring up to 1.6 cm in short axis. Reproductive: The bladder is mildly distended and contains trace contrast. The prostate remains normal in size. Other: Trace ascites is noted about the right side of the abdomen. Diffuse presacral soft tissue inflammation is again noted. There is diffuse soft tissue edema tracking about the anorectal canal, without definite evidence of abscess. Edema is seen tracking along the right side of the gluteal cleft, similar in appearance to the prior study. This may reflect a persistent subcutaneous abscess, given stability from the prior study, though not well assessed on delayed images. A small drainage catheter is noted posterior to the anorectal canal. Scrotal wall edema is noted, worse on the right, with small bilateral hydroceles. Musculoskeletal: No acute osseous abnormalities are identified. The visualized musculature is unremarkable in appearance. IMPRESSION: 1. Edema tracking along the right side of the gluteal cleft is similar in appearance to the prior study. This reflected a subcutaneous abscess on the prior study. This most likely reflects a persistent small subcutaneous abscess. 2. Diffuse soft tissue edema tracking about the anorectal canal, without additional abscess. Diffuse presacral soft tissue inflammation noted. 3. Scrotal wall edema, worse on the right, with small bilateral hydroceles. 4. Mild right basilar airspace opacity raises concern for pneumonia. 5. Mild wall thickening along the distal sigmoid colon and rectum, raising  concern for proctitis. 6. Enlarged azygoesophageal recess, retroperitoneal, pelvic sidewall and bilateral inguinal nodes seen, measuring up to 1.7 cm in short axis. This likely reflects the underlying infection. 7. Cholelithiasis.  Gallbladder otherwise unremarkable. Electronically Signed   By: JGarald BaldingM.D.   On: 12/21/2016 01:29   Ct Abdomen Pelvis W Contrast  Result Date: 12/17/2016 CLINICAL DATA:  Known rectal abscesses. Frequent vomiting past 4 days. Hypotension earlier today. HIV positive. EXAM: CT ABDOMEN AND PELVIS WITH CONTRAST TECHNIQUE: Multidetector CT imaging of the abdomen and pelvis was performed using the standard protocol following bolus administration of intravenous contrast. CONTRAST:  <See Chart> ISOVUE-300 IOPAMIDOL (ISOVUE-300) INJECTION 61% COMPARISON:  10/31/2016 FINDINGS: Lower  chest: Lung bases are within normal. Hepatobiliary: Single punctate gallstone. Liver and biliary tree are within normal. Pancreas: Within normal. Spleen: Within normal. Adrenals/Urinary Tract: Adrenal glands are normal. Kidneys are normal in size without hydronephrosis or nephrolithiasis. Ureters and bladder are normal. Stomach/Bowel: Stomach and small bowel are within normal. Appendix is within normal. Colon is unremarkable. Vascular/Lymphatic: Vascular structures are within normal. Mildly prominent inguinal lymph nodes unchanged. Reproductive: Unremarkable. Other: Slight interval decrease in size of a medial right gluteal subcutaneous abscess measuring 1 x 3.3 cm (previously 1.5 x 3.8 cm). Small caliber drain projects very superficial lesion over the lower gluteal crease just above this subcutaneous abscess. Mild wall thickening of the rectum with small round enhancing right perirectal abscess measuring 0.8 x 1.6 cm. Subtle perirectal inflammation. Few small perirectal lymph nodes are present. Slight worsening external iliac chain adenopathy with the largest node over the right iliac chain measuring 1.6 x  3.3 cm. Musculoskeletal: Within normal. IMPRESSION: Persistent rectal wall thickening and perirectal inflammation with new small right perirectal abscess measuring 0.8 x 1.6 cm. Interval decrease in size of a medial right gluteal subcutaneous abscess measuring 1 x 3.3 cm (previously 1.5 x 3.8 cm). Bilateral inguinal and iliac chain/ pelvic adenopathy slightly worse. Findings likely related to patient's ongoing infection described above as well as known HIV. Minimal cholelithiasis unchanged. These results were called by telephone at the time of interpretation on 12/17/2016 at 10:13 pm to Dr. Quintella Reichert , who verbally acknowledged these results. Electronically Signed   By: Marin Olp M.D.   On: 12/17/2016 22:14   Dg Chest Port 1 View  Result Date: 12/23/2016 CLINICAL DATA:  Generalized body aches 1 week. Chest pain. Short of breath. HIV positive. EXAM: PORTABLE CHEST 1 VIEW COMPARISON:  12/20/2016 FINDINGS: The heart is borderline enlarged. Central and basilar hazy opacities of developed. No pneumothorax. There is new prominence of the hilar regions. IMPRESSION: New hilar prominence and bilateral hazy airspace opacities compared with 3 days ago. Differential diagnosis includes new pulmonary edema with increased size of the hilar lymph nodes or an inflammatory process. Opportunistic infection should be considered. Electronically Signed   By: Marybelle Killings M.D.   On: 12/23/2016 09:44     ELGERGAWY, DAWOOD M.D on 12/23/2016 at 1:26 PM  Between 7am to 7pm - Pager - [phone]  After 7pm go to [email or web address] - password [facility]  [facility] -  Office  [phone]

## 2016-12-23 NOTE — Assessment & Plan Note (Signed)
Replete mag 1.3 with 4gm mag sulfate

## 2016-12-23 NOTE — Assessment & Plan Note (Signed)
Has mild hypoxemia pulse ox 92% with tachypnea RR 20 on 12/23/2016  This is in setting of immune suppression and pulmonary infiltrates High risk decompensation  PLAN Check cxr Check abg Low threshold to bipap to ward off intubation

## 2016-12-24 DIAGNOSIS — J189 Pneumonia, unspecified organism: Secondary | ICD-10-CM

## 2016-12-24 DIAGNOSIS — B451 Cerebral cryptococcosis: Secondary | ICD-10-CM

## 2016-12-24 DIAGNOSIS — J188 Other pneumonia, unspecified organism: Secondary | ICD-10-CM

## 2016-12-24 DIAGNOSIS — B20 Human immunodeficiency virus [HIV] disease: Principal | ICD-10-CM

## 2016-12-24 DIAGNOSIS — R21 Rash and other nonspecific skin eruption: Secondary | ICD-10-CM

## 2016-12-24 DIAGNOSIS — L818 Other specified disorders of pigmentation: Secondary | ICD-10-CM

## 2016-12-24 DIAGNOSIS — B457 Disseminated cryptococcosis: Secondary | ICD-10-CM

## 2016-12-24 DIAGNOSIS — G0490 Encephalitis and encephalomyelitis, unspecified: Secondary | ICD-10-CM

## 2016-12-24 DIAGNOSIS — K611 Rectal abscess: Secondary | ICD-10-CM

## 2016-12-24 DIAGNOSIS — J984 Other disorders of lung: Secondary | ICD-10-CM

## 2016-12-24 DIAGNOSIS — R591 Generalized enlarged lymph nodes: Secondary | ICD-10-CM

## 2016-12-24 DIAGNOSIS — L270 Generalized skin eruption due to drugs and medicaments taken internally: Secondary | ICD-10-CM

## 2016-12-24 LAB — BASIC METABOLIC PANEL
Anion gap: 5 (ref 5–15)
BUN: 10 mg/dL (ref 6–20)
CO2: 19 mmol/L — ABNORMAL LOW (ref 22–32)
Calcium: 7.5 mg/dL — ABNORMAL LOW (ref 8.9–10.3)
Chloride: 109 mmol/L (ref 101–111)
Creatinine, Ser: 1.05 mg/dL (ref 0.61–1.24)
GFR calc Af Amer: >60 mL/min (ref >60)
Glucose, Bld: 104 mg/dL — ABNORMAL HIGH (ref 65–99)
POTASSIUM: 3.3 mmol/L — AB (ref 3.5–5.1)
SODIUM: 133 mmol/L — AB (ref 135–145)

## 2016-12-24 LAB — BLOOD CULTURE ID PANEL (REFLEXED)
ACINETOBACTER BAUMANNII: NOT DETECTED
CANDIDA ALBICANS: NOT DETECTED
Candida glabrata: NOT DETECTED
Candida krusei: NOT DETECTED
Candida parapsilosis: NOT DETECTED
Candida tropicalis: NOT DETECTED
ENTEROBACTERIACEAE SPECIES: NOT DETECTED
ENTEROCOCCUS SPECIES: NOT DETECTED
Enterobacter cloacae complex: NOT DETECTED
Escherichia coli: NOT DETECTED
HAEMOPHILUS INFLUENZAE: NOT DETECTED
KLEBSIELLA OXYTOCA: NOT DETECTED
Klebsiella pneumoniae: NOT DETECTED
LISTERIA MONOCYTOGENES: NOT DETECTED
NEISSERIA MENINGITIDIS: NOT DETECTED
PSEUDOMONAS AERUGINOSA: NOT DETECTED
Proteus species: NOT DETECTED
STREPTOCOCCUS AGALACTIAE: NOT DETECTED
STREPTOCOCCUS PYOGENES: NOT DETECTED
STREPTOCOCCUS SPECIES: NOT DETECTED
Serratia marcescens: NOT DETECTED
Staphylococcus aureus (BCID): NOT DETECTED
Staphylococcus species: NOT DETECTED
Streptococcus pneumoniae: NOT DETECTED

## 2016-12-24 LAB — CBC
HCT: 24.8 % — ABNORMAL LOW (ref 39.0–52.0)
HEMOGLOBIN: 7.8 g/dL — AB (ref 13.0–17.0)
MCH: 26.2 pg (ref 26.0–34.0)
MCHC: 31.5 g/dL (ref 30.0–36.0)
MCV: 83.2 fL (ref 78.0–100.0)
Platelets: 64 10*3/uL — ABNORMAL LOW (ref 150–400)
RBC: 2.98 MIL/uL — ABNORMAL LOW (ref 4.22–5.81)
RDW: 15.1 % (ref 11.5–15.5)
WBC: 8.2 10*3/uL (ref 4.0–10.5)

## 2016-12-24 LAB — PROCALCITONIN: PROCALCITONIN: 8.72 ng/mL

## 2016-12-24 LAB — MAGNESIUM: MAGNESIUM: 2.3 mg/dL (ref 1.7–2.4)

## 2016-12-24 MED ORDER — POTASSIUM CHLORIDE CRYS ER 20 MEQ PO TBCR
40.0000 meq | EXTENDED_RELEASE_TABLET | Freq: Once | ORAL | Status: DC
  Filled 2016-12-24: qty 2

## 2016-12-24 MED ORDER — POTASSIUM CHLORIDE CRYS ER 20 MEQ PO TBCR
40.0000 meq | EXTENDED_RELEASE_TABLET | ORAL | Status: AC
  Administered 2016-12-24: 40 meq via ORAL

## 2016-12-24 NOTE — Progress Notes (Signed)
3 Days Post-Op    CC:  Perirectal abscess/draining seton in current fistula from [facility]  Subjective: Buttocks abscess looks OK, he can shower and Sitz bath with it.    Objective: Vital signs in last 24 hours: Temp:  [98.4 F (36.9 C)-101.3 F (38.5 C)] 98.4 F (36.9 C) (05/26 0747) Pulse Rate:  [92] 92 (05/26 0747) Resp:  [17-25] 17 (05/26 0747) BP: (105-130)/(60-87) 114/80 (05/26 0747) SpO2:  [97 %-99 %] 97 % (05/26 0747) Last BM Date: 12/23/16 956 PO 3100 IV 300 urine recorded Still spiking fevers up to 101.3 last PM Na 133, K+ 3.3, CO2 19 WBC 8.2 H/H 7.8/24.8/platelets down to 64K  Intake/Output from previous day: 05/25 0701 - 05/26 0700 In: 4152.3 [P.O.:956; I.V.:1646.3; IV Piggyback:1550] Out: 300 [Urine:300] Intake/Output this shift: No intake/output data recorded.  General appearance: alert and not very cooperative Skin: Skin color, texture, turgor normal. No rashes or lesions or open site is clean, needs ongoing dressing changes. No cellulits no drainage on exam  Lab Results:   Recent Labs  12/23/16 0507 12/24/16 0249  WBC 6.2 8.2  HGB 8.3* 7.8*  HCT 26.6* 24.8*  PLT 128* 64*    BMET  Recent Labs  12/23/16 0507 12/24/16 0249  NA 129* 133*  K 4.7 3.3*  CL 101 109  CO2 21* 19*  GLUCOSE 73 104*  BUN 10 10  CREATININE 1.12 1.05  CALCIUM 7.5* 7.5*   PT/INR  Recent Labs  12/22/16 1125  LABPROT 15.2  INR 1.20     Recent Labs Lab 12/17/16 2001 12/20/16 0647 12/20/16 1947 12/22/16 0658  AST 21 62* 139* 154*  ALT 13* 38 62 89*  ALKPHOS 69 75 76 82  BILITOT 1.0 0.6 1.3* 0.8  PROT 9.9* 9.2* 8.4* 7.7  ALBUMIN 3.2* 2.9* 2.6* 2.2*     Lipase  No results found for: LIPASE   Medications: . azithromycin  1,200 mg Oral Weekly  . flucytosine  1,500 mg Oral Q6H  . ibuprofen  600 mg Oral TID  . lidocaine (PF)  5 mL Other Once  . magic mouthwash  5 mL Oral QID  . potassium chloride  40 mEq Oral Once  . sulfamethoxazole-trimethoprim  1  tablet Oral Q12H  . valACYclovir  1,000 mg Oral BID   . sodium chloride 100 mL/hr at 12/23/16 0449  . sodium chloride 75 mL/hr at 12/23/16 1845  . amphotericin  B  Liposome (AMBISOME) ADULT IV Stopped (12/23/16 1242)  . cefTRIAXone (ROCEPHIN)  IV Stopped (12/23/16 2155)  . sodium chloride 500 mL (12/24/16 0751)  . sodium chloride 500 mL (12/24/16 0750)   Anti-infectives    Start     Dose/Rate Route Frequency Ordered Stop   12/23/16 2200  sulfamethoxazole-trimethoprim (BACTRIM DS,SEPTRA DS) 800-160 MG per tablet 1 tablet     1 tablet Oral Every 12 hours 12/23/16 1822     12/23/16 1900  cefTRIAXone (ROCEPHIN) 2 g in dextrose 5 % 50 mL IVPB     2 g 100 mL/hr over 30 Minutes Intravenous Every 24 hours 12/23/16 1821     12/23/16 0900  amphotericin B liposome (AMBISOME) 200 mg in dextrose 5 % 500 mL IVPB     200 mg 250 mL/hr over 120 Minutes Intravenous Every 24 hours 12/22/16 0919     12/23/16 0800  azithromycin (ZITHROMAX) tablet 1,200 mg     1,200 mg Oral Weekly 12/22/16 0811     12/22/16 2200  valACYclovir (VALTREX) tablet 1,000 mg  1,000 mg Oral 2 times daily 12/22/16 1611 12/29/16 2159   12/22/16 1800  amoxicillin-clavulanate (AUGMENTIN) 875-125 MG per tablet 1 tablet  Status:  Discontinued     1 tablet Oral 2 times daily with meals 12/22/16 1446 12/23/16 1821   12/22/16 1800  doxycycline (VIBRA-TABS) tablet 100 mg  Status:  Discontinued     100 mg Oral 2 times daily with meals 12/22/16 1446 12/23/16 1821   12/22/16 1200  flucytosine (ANCOBON) capsule 1,500 mg     1,500 mg Oral Every 6 hours 12/22/16 0614     12/22/16 1000  azithromycin (ZITHROMAX) tablet 250 mg  Status:  Discontinued     250 mg Oral Daily 12/21/16 0224 12/21/16 1131   12/22/16 1000  doxycycline (VIBRA-TABS) tablet 100 mg  Status:  Discontinued     100 mg Oral Every 12 hours 12/22/16 0942 12/22/16 1446   12/22/16 0000  amphotericin B liposome (AMBISOME) 190 mg in dextrose 5 % 500 mL IVPB  Status:  Discontinued      3 mg/kg  64.2 kg 250 mL/hr over 120 Minutes Intravenous Every 24 hours 12/21/16 2313 12/22/16 0919   12/22/16 0000  flucytosine (ANCOBON) capsule 250 mg  Status:  Discontinued     250 mg Oral Every 6 hours 12/21/16 2314 12/21/16 2325   12/22/16 0000  flucytosine (ANCOBON) capsule 1,500 mg  Status:  Discontinued     1,500 mg Oral Every 6 hours 12/21/16 2326 12/22/16 0614   12/21/16 1645  cefTRIAXone (ROCEPHIN) 2 g in dextrose 5 % 50 mL IVPB  Status:  Discontinued     2 g 100 mL/hr over 30 Minutes Intravenous Every 24 hours 12/21/16 1539 12/22/16 1446   12/21/16 1645  metroNIDAZOLE (FLAGYL) IVPB 500 mg  Status:  Discontinued     500 mg 100 mL/hr over 60 Minutes Intravenous Every 8 hours 12/21/16 1539 12/21/16 2348   12/21/16 1600  cefTRIAXone (ROCEPHIN) 2 g in dextrose 5 % 50 mL IVPB  Status:  Discontinued     2 g 100 mL/hr over 30 Minutes Intravenous Every 24 hours 12/21/16 1131 12/21/16 1558   12/21/16 1400  vancomycin (VANCOCIN) IVPB 750 mg/150 ml premix  Status:  Discontinued     750 mg 150 mL/hr over 60 Minutes Intravenous Every 8 hours 12/21/16 0142 12/22/16 0942   12/21/16 1000  sulfamethoxazole-trimethoprim (BACTRIM DS,SEPTRA DS) 800-160 MG per tablet 1 tablet  Status:  Discontinued     1 tablet Oral Daily 12/21/16 0058 12/21/16 0100   12/21/16 1000  abacavir-dolutegravir-lamiVUDine (TRIUMEQ) 600-50-300 MG per tablet 1 tablet  Status:  Discontinued     1 tablet Oral Daily 12/21/16 0204 12/22/16 0903   12/21/16 1000  sulfamethoxazole-trimethoprim (BACTRIM DS,SEPTRA DS) 800-160 MG per tablet 1 tablet  Status:  Discontinued     1 tablet Oral Daily 12/21/16 0204 12/23/16 1822   12/21/16 0800  ceFEPIme (MAXIPIME) 2 g in dextrose 5 % 50 mL IVPB  Status:  Discontinued     2 g 100 mL/hr over 30 Minutes Intravenous Every 8 hours 12/21/16 0137 12/21/16 1131   12/21/16 0600  piperacillin-tazobactam (ZOSYN) IVPB 3.375 g  Status:  Discontinued     3.375 g 12.5 mL/hr over 240 Minutes  Intravenous Every 8 hours 12/21/16 0058 12/21/16 0132   12/21/16 0200  metroNIDAZOLE (FLAGYL) IVPB 500 mg  Status:  Discontinued     500 mg 100 mL/hr over 60 Minutes Intravenous Every 8 hours 12/21/16 0137 12/22/16 1446   12/21/16 0145  ceFEPIme (MAXIPIME) 2 g in dextrose 5 % 50 mL IVPB     2 g 100 mL/hr over 30 Minutes Intravenous  Once 12/21/16 0137 12/21/16 0358   12/21/16 0145  vancomycin (VANCOCIN) IVPB 1000 mg/200 mL premix     1,000 mg 200 mL/hr over 60 Minutes Intravenous  Once 12/21/16 0142 12/21/16 0429   12/21/16 0130  piperacillin-tazobactam (ZOSYN) IVPB 3.375 g  Status:  Discontinued     3.375 g 100 mL/hr over 30 Minutes Intravenous  Once 12/21/16 0115 12/21/16 0132   12/21/16 0130  azithromycin (ZITHROMAX) powder 1 g     1 g Oral  Once 12/21/16 0125 12/21/16 0328       Assessment/Plan Perirectal abscess; S/p I&D 5/23 Dr Rosendo Gros HIV/AIDS - with disseminated cryptococcal disease Pulmonary infiltrates SIRS (systemic inflammatory response syndrome) (HCC) Acute respiratory failure with hypoxia Chronic pain Anemia Encephalopathy Hyponatremia  FEN: IV fluids ID: Multiple per Dr. Baxter Flattery (ID) DVT:  SCD    Plan:  BID and Prn dressing changes.  Shower and get soap and water into the site.  From our standpoint this can be done at home.      LOS: 3 days    Kathlyne Loud 12/24/2016 [phone]

## 2016-12-24 NOTE — Plan of Care (Signed)
Problem: Health Behavior/Discharge Planning: Goal: Ability to manage health-related needs will improve Outcome: Progressing Discussed plan of care for the bathroom and medications with some teach back displayed  Comments: Topic of pain medication and not given when patient is resting with their eyes closed disucssed.

## 2016-12-24 NOTE — Progress Notes (Signed)
Subjective: No new complaints   Antibiotics:  Anti-infectives    Start     Dose/Rate Route Frequency Ordered Stop   12/23/16 2200  sulfamethoxazole-trimethoprim (BACTRIM DS,SEPTRA DS) 800-160 MG per tablet 1 tablet     1 tablet Oral Every 12 hours 12/23/16 1822     12/23/16 1900  cefTRIAXone (ROCEPHIN) 2 g in dextrose 5 % 50 mL IVPB     2 g 100 mL/hr over 30 Minutes Intravenous Every 24 hours 12/23/16 1821     12/23/16 0900  amphotericin B liposome (AMBISOME) 200 mg in dextrose 5 % 500 mL IVPB     200 mg 250 mL/hr over 120 Minutes Intravenous Every 24 hours 12/22/16 0919     12/23/16 0800  azithromycin (ZITHROMAX) tablet 1,200 mg     1,200 mg Oral Weekly 12/22/16 0811     12/22/16 2200  valACYclovir (VALTREX) tablet 1,000 mg     1,000 mg Oral 2 times daily 12/22/16 1611 12/29/16 2159   12/22/16 1800  amoxicillin-clavulanate (AUGMENTIN) 875-125 MG per tablet 1 tablet  Status:  Discontinued     1 tablet Oral 2 times daily with meals 12/22/16 1446 12/23/16 1821   12/22/16 1800  doxycycline (VIBRA-TABS) tablet 100 mg  Status:  Discontinued     100 mg Oral 2 times daily with meals 12/22/16 1446 12/23/16 1821   12/22/16 1200  flucytosine (ANCOBON) capsule 1,500 mg     1,500 mg Oral Every 6 hours 12/22/16 0614     12/22/16 1000  azithromycin (ZITHROMAX) tablet 250 mg  Status:  Discontinued     250 mg Oral Daily 12/21/16 0224 12/21/16 1131   12/22/16 1000  doxycycline (VIBRA-TABS) tablet 100 mg  Status:  Discontinued     100 mg Oral Every 12 hours 12/22/16 0942 12/22/16 1446   12/22/16 0000  amphotericin B liposome (AMBISOME) 190 mg in dextrose 5 % 500 mL IVPB  Status:  Discontinued     3 mg/kg  64.2 kg 250 mL/hr over 120 Minutes Intravenous Every 24 hours 12/21/16 2313 12/22/16 0919   12/22/16 0000  flucytosine (ANCOBON) capsule 250 mg  Status:  Discontinued     250 mg Oral Every 6 hours 12/21/16 2314 12/21/16 2325   12/22/16 0000  flucytosine (ANCOBON) capsule 1,500 mg   Status:  Discontinued     1,500 mg Oral Every 6 hours 12/21/16 2326 12/22/16 0614   12/21/16 1645  cefTRIAXone (ROCEPHIN) 2 g in dextrose 5 % 50 mL IVPB  Status:  Discontinued     2 g 100 mL/hr over 30 Minutes Intravenous Every 24 hours 12/21/16 1539 12/22/16 1446   12/21/16 1645  metroNIDAZOLE (FLAGYL) IVPB 500 mg  Status:  Discontinued     500 mg 100 mL/hr over 60 Minutes Intravenous Every 8 hours 12/21/16 1539 12/21/16 2348   12/21/16 1600  cefTRIAXone (ROCEPHIN) 2 g in dextrose 5 % 50 mL IVPB  Status:  Discontinued     2 g 100 mL/hr over 30 Minutes Intravenous Every 24 hours 12/21/16 1131 12/21/16 1558   12/21/16 1400  vancomycin (VANCOCIN) IVPB 750 mg/150 ml premix  Status:  Discontinued     750 mg 150 mL/hr over 60 Minutes Intravenous Every 8 hours 12/21/16 0142 12/22/16 0942   12/21/16 1000  sulfamethoxazole-trimethoprim (BACTRIM DS,SEPTRA DS) 800-160 MG per tablet 1 tablet  Status:  Discontinued     1 tablet Oral Daily 12/21/16 0058 12/21/16 0100   12/21/16 1000  abacavir-dolutegravir-lamiVUDine (  TRIUMEQ) 992-42-683 MG per tablet 1 tablet  Status:  Discontinued     1 tablet Oral Daily 12/21/16 0204 12/22/16 0903   12/21/16 1000  sulfamethoxazole-trimethoprim (BACTRIM DS,SEPTRA DS) 800-160 MG per tablet 1 tablet  Status:  Discontinued     1 tablet Oral Daily 12/21/16 0204 12/23/16 1822   12/21/16 0800  ceFEPIme (MAXIPIME) 2 g in dextrose 5 % 50 mL IVPB  Status:  Discontinued     2 g 100 mL/hr over 30 Minutes Intravenous Every 8 hours 12/21/16 0137 12/21/16 1131   12/21/16 0600  piperacillin-tazobactam (ZOSYN) IVPB 3.375 g  Status:  Discontinued     3.375 g 12.5 mL/hr over 240 Minutes Intravenous Every 8 hours 12/21/16 0058 12/21/16 0132   12/21/16 0200  metroNIDAZOLE (FLAGYL) IVPB 500 mg  Status:  Discontinued     500 mg 100 mL/hr over 60 Minutes Intravenous Every 8 hours 12/21/16 0137 12/22/16 1446   12/21/16 0145  ceFEPIme (MAXIPIME) 2 g in dextrose 5 % 50 mL IVPB     2 g 100  mL/hr over 30 Minutes Intravenous  Once 12/21/16 0137 12/21/16 0358   12/21/16 0145  vancomycin (VANCOCIN) IVPB 1000 mg/200 mL premix     1,000 mg 200 mL/hr over 60 Minutes Intravenous  Once 12/21/16 0142 12/21/16 0429   12/21/16 0130  piperacillin-tazobactam (ZOSYN) IVPB 3.375 g  Status:  Discontinued     3.375 g 100 mL/hr over 30 Minutes Intravenous  Once 12/21/16 0115 12/21/16 0132   12/21/16 0130  azithromycin (ZITHROMAX) powder 1 g     1 g Oral  Once 12/21/16 0125 12/21/16 0328      Medications: Scheduled Meds: . azithromycin  1,200 mg Oral Weekly  . flucytosine  1,500 mg Oral Q6H  . lidocaine (PF)  5 mL Other Once  . magic mouthwash  5 mL Oral QID  . potassium chloride  40 mEq Oral NOW  . sulfamethoxazole-trimethoprim  1 tablet Oral Q12H  . valACYclovir  1,000 mg Oral BID   Continuous Infusions: . sodium chloride 100 mL/hr at 12/23/16 0449  . sodium chloride 75 mL/hr at 12/23/16 1845  . amphotericin  B  Liposome (AMBISOME) ADULT IV 200 mg (12/24/16 0953)  . cefTRIAXone (ROCEPHIN)  IV Stopped (12/23/16 2155)  . sodium chloride Stopped (12/23/16 1030)  . sodium chloride Stopped (12/24/16 0850)   PRN Meds:.acetaminophen **OR** acetaminophen, diphenhydrAMINE **OR** diphenhydrAMINE, HYDROmorphone (DILAUDID) injection, [COMPLETED] ketorolac **FOLLOWED BY** ketorolac, ondansetron **OR** ondansetron (ZOFRAN) IV, oxyCODONE, polyethylene glycol, simethicone    Objective: Weight change:   Intake/Output Summary (Last 24 hours) at 12/24/16 1030 Last data filed at 12/24/16 0600  Gross per 24 hour  Intake          4152.25 ml  Output              300 ml  Net          3852.25 ml   Blood pressure 114/80, pulse 92, temperature 98.4 F (36.9 C), temperature source Oral, resp. rate 17, height 5\' 6"  (1.676 m), weight 141 lb 8 oz (64.2 kg), SpO2 97 %. Temp:  [98.4 F (36.9 C)-101.3 F (38.5 C)] 98.4 F (36.9 C) (05/26 0747) Pulse Rate:  [92] 92 (05/26 0747) Resp:  [17-25] 17 (05/26  0747) BP: (105-130)/(60-87) 114/80 (05/26 0747) SpO2:  [97 %-99 %] 97 % (05/26 0747)  Physical Exam: General: Alert and awake, oriented x3, not wearing any clothes except for underwear HEENT: anicteric sclera, pupils reactive to light and accommodation,  EOMI CVS regular rate, normal r,  no murmur rubs or gallops Chest: clear to auscultation bilaterally, no wheezing, rales or rhonchi Abdomen: soft nontender, nondistended, normal bowel sounds, Extremities: no  clubbing or edema noted bilaterally Skin: Multiple tattoos his abscess site was not examined Neuro: nonfocal  CBC:  CBC Latest Ref Rng & Units 12/24/2016 12/23/2016 12/22/2016  WBC 4.0 - 10.5 K/uL 8.2 6.2 9.3  Hemoglobin 13.0 - 17.0 g/dL 7.8(L) 8.3(L) 8.3(L)  Hematocrit 39.0 - 52.0 % 24.8(L) 26.6(L) 26.6(L)  Platelets 150 - 400 K/uL 64(L) 128(L) 95(L)  \   BMET  Recent Labs  12/23/16 0507 12/24/16 0249  NA 129* 133*  K 4.7 3.3*  CL 101 109  CO2 21* 19*  GLUCOSE 73 104*  BUN 10 10  CREATININE 1.12 1.05  CALCIUM 7.5* 7.5*     Liver Panel   Recent Labs  12/22/16 0658  PROT 7.7  ALBUMIN 2.2*  AST 154*  ALT 89*  ALKPHOS 82  BILITOT 0.8       Sedimentation Rate No results for input(s): ESRSEDRATE in the last 72 hours. C-Reactive Protein No results for input(s): CRP in the last 72 hours.  Micro Results: Recent Results (from the past 720 hour(s))  Culture, blood (Routine X 2) w Reflex to ID Panel     Status: None (Preliminary result)   Collection Time: 12/20/16  6:42 AM  Result Value Ref Range Status   Specimen Description BLOOD LEFT ANTECUBITAL  Final   Special Requests   Final    BOTTLES DRAWN AEROBIC AND ANAEROBIC Blood Culture adequate volume   Culture  Setup Time AEROBIC BOTTLE ONLY YEAST   Final   Culture   Final    NO GROWTH 3 DAYS Performed at [facility], [address], [place], [place] [address]    Report Status PENDING  Incomplete  Culture, blood (Routine X 2) w Reflex to ID  Panel     Status: None (Preliminary result)   Collection Time: 12/20/16  6:54 AM  Result Value Ref Range Status   Specimen Description BLOOD RIGHT ANTECUBITAL  Final   Special Requests IN PEDIATRIC BOTTLE Blood Culture adequate volume  Final   Culture  Setup Time   Final    IN PEDIATRIC BOTTLE YEAST CRITICAL RESULT CALLED TO, READ BACK BY AND VERIFIED WITHJerel Shepherd PHARMD, AT [id number] 12/23/16 BY Rush Landmark Performed at [facility], [place] [address], [place], [place] [address]    Culture YEAST  Final   Report Status PENDING  Incomplete  Blood Culture ID Panel (Reflexed)     Status: None   Collection Time: 12/20/16  6:54 AM  Result Value Ref Range Status   Enterococcus species NOT DETECTED NOT DETECTED Final   Listeria monocytogenes NOT DETECTED NOT DETECTED Final   Staphylococcus species NOT DETECTED NOT DETECTED Final   Staphylococcus aureus NOT DETECTED NOT DETECTED Final   Streptococcus species NOT DETECTED NOT DETECTED Final   Streptococcus agalactiae NOT DETECTED NOT DETECTED Final   Streptococcus pneumoniae NOT DETECTED NOT DETECTED Final   Streptococcus pyogenes NOT DETECTED NOT DETECTED Final   Acinetobacter baumannii NOT DETECTED NOT DETECTED Final   Enterobacteriaceae species NOT DETECTED NOT DETECTED Final   Enterobacter cloacae complex NOT DETECTED NOT DETECTED Final   Escherichia coli NOT DETECTED NOT DETECTED Final   Klebsiella oxytoca NOT DETECTED NOT DETECTED Final   Klebsiella pneumoniae NOT DETECTED NOT DETECTED Final   Proteus species NOT DETECTED NOT DETECTED Final  Serratia marcescens NOT DETECTED NOT DETECTED Final   Haemophilus influenzae NOT DETECTED NOT DETECTED Final   Neisseria meningitidis NOT DETECTED NOT DETECTED Final   Pseudomonas aeruginosa NOT DETECTED NOT DETECTED Final   Candida albicans NOT DETECTED NOT DETECTED Final   Candida glabrata NOT DETECTED NOT DETECTED Final   Candida krusei NOT DETECTED NOT DETECTED Final   Candida parapsilosis  NOT DETECTED NOT DETECTED Final   Candida tropicalis NOT DETECTED NOT DETECTED Final    Comment: Performed at [facility], [place] [address], [place], [place] [address]  Surgical pcr screen     Status: None   Collection Time: 12/21/16  2:25 AM  Result Value Ref Range Status   MRSA, PCR NEGATIVE NEGATIVE Final   Staphylococcus aureus NEGATIVE NEGATIVE Final    Comment:        The Xpert SA Assay (FDA approved for NASAL specimens in patients over 71 years of age), is one component of a comprehensive surveillance program.  Test performance has been validated by [facility] for patients greater than or equal to 66 year old. It is not intended to diagnose infection nor to guide or monitor treatment.   Blood culture (routine x 2)     Status: None (Preliminary result)   Collection Time: 12/21/16  4:11 AM  Result Value Ref Range Status   Specimen Description BLOOD LEFT ANTECUBITAL  Final   Special Requests   Final    BOTTLES DRAWN AEROBIC ONLY Blood Culture adequate volume   Culture NO GROWTH 2 DAYS  Final   Report Status PENDING  Incomplete  Blood culture (routine x 2)     Status: None (Preliminary result)   Collection Time: 12/21/16  4:11 AM  Result Value Ref Range Status   Specimen Description BLOOD RIGHT HAND  Final   Special Requests IN PEDIATRIC BOTTLE Blood Culture adequate volume  Final   Culture  Setup Time   Final    IN PEDIATRIC BOTTLE YEAST CRITICAL RESULT CALLED TO, READ BACK BY AND VERIFIED WITH: G.HIBBETT PHARMD, [id number] 12/24/16 M.CAMPBELL    Culture YEAST  Final   Report Status PENDING  Incomplete  Blood Culture ID Panel (Reflexed)     Status: None   Collection Time: 12/21/16  4:11 AM  Result Value Ref Range Status   Enterococcus species NOT DETECTED NOT DETECTED Final   Listeria monocytogenes NOT DETECTED NOT DETECTED Final   Staphylococcus species NOT DETECTED NOT DETECTED Final   Staphylococcus aureus NOT DETECTED NOT DETECTED Final   Streptococcus species  NOT DETECTED NOT DETECTED Final   Streptococcus agalactiae NOT DETECTED NOT DETECTED Final   Streptococcus pneumoniae NOT DETECTED NOT DETECTED Final   Streptococcus pyogenes NOT DETECTED NOT DETECTED Final   Acinetobacter baumannii NOT DETECTED NOT DETECTED Final   Enterobacteriaceae species NOT DETECTED NOT DETECTED Final   Enterobacter cloacae complex NOT DETECTED NOT DETECTED Final   Escherichia coli NOT DETECTED NOT DETECTED Final   Klebsiella oxytoca NOT DETECTED NOT DETECTED Final   Klebsiella pneumoniae NOT DETECTED NOT DETECTED Final   Proteus species NOT DETECTED NOT DETECTED Final   Serratia marcescens NOT DETECTED NOT DETECTED Final   Haemophilus influenzae NOT DETECTED NOT DETECTED Final   Neisseria meningitidis NOT DETECTED NOT DETECTED Final   Pseudomonas aeruginosa NOT DETECTED NOT DETECTED Final   Candida albicans NOT DETECTED NOT DETECTED Final   Candida glabrata NOT DETECTED NOT DETECTED Final   Candida krusei NOT DETECTED NOT DETECTED Final   Candida parapsilosis NOT  DETECTED NOT DETECTED Final   Candida tropicalis NOT DETECTED NOT DETECTED Final  Aerobic/Anaerobic Culture (surgical/deep wound)     Status: Abnormal (Preliminary result)   Collection Time: 12/21/16  1:39 PM  Result Value Ref Range Status   Specimen Description ABSCESS  Final   Special Requests   Final    PERIANAL PATIENT ON FOLLOWING  SEPTRA DS AND ZITHROMAX   Gram Stain   Final    FEW WBC PRESENT,BOTH PMN AND MONONUCLEAR FEW GRAM VARIABLE ROD    Culture (A)  Final    MULTIPLE ORGANISMS PRESENT, NONE PREDOMINANT HOLDING FOR POSSIBLE ANAEROBE    Report Status PENDING  Incomplete  CSF culture with Stat gram stain     Status: None (Preliminary result)   Collection Time: 12/22/16  9:29 AM  Result Value Ref Range Status   Specimen Description BACK  Final   Special Requests Immunocompromised  Final   Gram Stain   Final    CYTOSPIN SMEAR WBC PRESENT, PREDOMINANTLY MONONUCLEAR YEAST CRITICAL  RESULT CALLED TO, READ BACK BY AND VERIFIED WITH: RN R BUTLER [phone]    Culture ABUNDANT YEAST IDENTIFICATION TO FOLLOW   Final   Report Status PENDING  Incomplete  Culture, fungus without smear     Status: None (Preliminary result)   Collection Time: 12/22/16  9:51 AM  Result Value Ref Range Status   Specimen Description CSF  Final   Special Requests Immunocompromised  Final   Culture NO FUNGUS ISOLATED AFTER 1 DAY  Final   Report Status PENDING  Incomplete    Studies/Results: Ct Head Wo Contrast  Result Date: 12/23/2016 CLINICAL DATA:  Mental status changes EXAM: CT HEAD WITHOUT CONTRAST TECHNIQUE: Contiguous axial images were obtained from the base of the skull through the vertex without intravenous contrast. COMPARISON:  None. FINDINGS: Brain: No evidence of acute infarction, hemorrhage, hydrocephalus, extra-axial collection or mass lesion/mass effect. Vascular: No hyperdense vessel or unexpected calcification. Skull: Normal. Negative for fracture or focal lesion. Sinuses/Orbits: No acute finding. Other: None. IMPRESSION: Normal brain. Electronically Signed   By: Kerby Moors M.D.   On: 12/23/2016 12:11   Dg Chest Port 1 View  Result Date: 12/23/2016 CLINICAL DATA:  Generalized body aches 1 week. Chest pain. Short of breath. HIV positive. EXAM: PORTABLE CHEST 1 VIEW COMPARISON:  12/20/2016 FINDINGS: The heart is borderline enlarged. Central and basilar hazy opacities of developed. No pneumothorax. There is new prominence of the hilar regions. IMPRESSION: New hilar prominence and bilateral hazy airspace opacities compared with 3 days ago. Differential diagnosis includes new pulmonary edema with increased size of the hilar lymph nodes or an inflammatory process. Opportunistic infection should be considered. Electronically Signed   By: Marybelle Killings M.D.   On: 12/23/2016 09:44      Assessment/Plan:  INTERVAL HISTORY:  Rash resolved   Principal Problem:   Perirectal  abscess Active Problems:   Chronic pain   AIDS (acquired immune deficiency syndrome) (HCC)   Hyponatremia   Normocytic anemia   Elevated LFTs   Pulmonary infiltrates   Hypomagnesemia   Encephalopathy acute   SIRS (systemic inflammatory response syndrome) (HCC)   Acute respiratory failure with hypoxia (HCC)    William Pena is a 31 y.o. male with  HIV/AIDS and now disseminated cryptococcal infection with cryptococcemia, cryptococcal meningitis, cavitary PNA (likely from crypto as well but could be TB as well), perirectal abscess, lymphadenopathy.  #1 Cryptococcemia with meningitis:  Continue ampho lipid + flucytosine, replete electrolytes and watch for  hematological toxicity from flucytosine since we can't monitor the levels here.  He will need 2 weeks of therapy followed by high-dose fluconazole.  I would favor postponing his antiretrovirals for 5 weeks.  Currently he denies headache but certainly he has return of headache he needs another lumbar puncture to ensure that we are controlling his intracranial pressures.  #2 HIV and AIDS: Fairly scandalous and unacceptable that he was not administered his antiretrovirals in prison especially given the degree of immune compromise he had when he was diagnosed  We will hold off on restarting them to try to avoid immune reconstitution inflammatory syndrome in the central nervous system   He will need PCP prophylaxis in the interim certainly  #3 perirectal abscess continue current antibiotics including ceftriaxone and Bactrim which will provide PCP prophylaxis as well.  Greatly appreciate general surgery.  #4 cavitary pneumonia: Could certainly be due to cryptococcus but I worry he could have tuberculosis I think he needs a bronchoscopy to get deep specimens for AFB stain and culture fungal stain and cultures bacterial cultures biopsy if needed  #4 Rash: resolved presumed from his pcn but he is tolerating ceph  #5 LA: would benefit  from biopsy. I will make sure we have at is sent AFB blood cultures.       LOS: 3 days   Alcide Evener 12/24/2016, 10:30 AM

## 2016-12-24 NOTE — Progress Notes (Signed)
Consult  PROGRESS NOTE                                                                                                                                                                                                             Patient Demographics:    William Pena, is a 31 y.o. male, DOB - 10-23-1985, WGN:[id number]  Admit date - 12/20/2016   Admitting Physician Md Edison Pace, MD  Outpatient Primary MD for the patient is [name], [name], MD  LOS - 3   Chief Complaint  Patient presents with  . Rectal Bleeding  . 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  [facility] hospitalist took over primary management on 5/24 given significant comorbidities.   Subjective:    William Pena today Reports he is feeling better, denies any complaints, no significant events overnight, denies any cough, chest pain or shortness of breath.   Assessment  & Plan :    Principal Problem:   Perirectal abscess Active Problems:   Chronic pain   AIDS (acquired immune deficiency syndrome) (HCC)   Hyponatremia   Normocytic anemia   Elevated LFTs   Pulmonary infiltrates   Hypomagnesemia   Encephalopathy acute   SIRS (systemic inflammatory response syndrome) (HCC)   Acute respiratory failure (HCC)   Rectal abscess   Cryptococcal meningoencephalitis (HCC)   Cryptococcosis (HCC)   Cavitary pneumonia   Diffuse lymphadenopathy   Drug rash   Perirectal abscess - Status post I&D 5/23 by  Dr. Rosendo Gros, wound care per surgical team, initially on vancomycin and cefepime, changed to Augmentin and doxycycline, Augmentin stopped secondary to rash, currently on Rocephin and Bactrim which will provide PCP prophylaxis as well.  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  Anemia: - Normocytic, stable.    Elevated LFTs: - Mild.Trend  Cavitary Pneumonia - CT chest with right upper small cavitary lesions, suspicion for mTB vs. MAC vs Crypto lung involvement - Bronchoscopy canceled on 5/25 secondary to frequent respiratory status, appears to be better, he will need bronchoscopy.  Acute encephalopathy - Secondary to narcotics, resolved .  Rash - Secondary to Augmentin.  Lymphadenopathy - Lymph node biopsy by IR.  Thrombocytopenia - Platelets dropped to 64 today, he is on flucytosine, will monitor closely, if continues to drop, will discuss with ID about stopping flucytosine  Hypokalemia - Repleted, recheck in a.m.   Code Status : Full  Family Communication  : D/W patient  Consults : Surgery, ID, IR, PCCM  Procedures  : LP 2/23  DVT Prophylaxis  : SCDs, will resume on lovenox after LN  biopsy.  Lab Results  Component Value Date   PLT 64 (L) 12/24/2016    Antibiotics  :    Anti-infectives    Start     Dose/Rate Route Frequency Ordered Stop   12/23/16 2200  sulfamethoxazole-trimethoprim (BACTRIM DS,SEPTRA DS) 800-160 MG per tablet 1 tablet     1 tablet Oral Every 12 hours 12/23/16 1822     12/23/16 1900  cefTRIAXone (ROCEPHIN) 2 g in dextrose 5 % 50 mL IVPB     2 g 100 mL/hr over 30 Minutes Intravenous Every 24 hours 12/23/16 1821     12/23/16 0900  amphotericin B liposome (AMBISOME) 200 mg in dextrose 5 % 500 mL IVPB     200 mg 250 mL/hr over 120 Minutes Intravenous Every 24 hours 12/22/16 0919     12/23/16 0800  azithromycin (ZITHROMAX) tablet 1,200 mg     1,200 mg Oral Weekly 12/22/16 0811     12/22/16 2200  valACYclovir (VALTREX) tablet 1,000 mg     1,000 mg Oral 2 times daily 12/22/16 1611 12/29/16 2159   12/22/16 1800  amoxicillin-clavulanate (AUGMENTIN) 875-125 MG per tablet 1 tablet  Status:  Discontinued     1 tablet Oral 2 times daily with meals 12/22/16 1446 12/23/16 1821   12/22/16 1800  doxycycline (VIBRA-TABS) tablet 100 mg  Status:  Discontinued     100 mg Oral 2 times daily with meals 12/22/16 1446 12/23/16 1821   12/22/16 1200  flucytosine (ANCOBON) capsule 1,500 mg     1,500 mg Oral Every 6 hours 12/22/16 0614     12/22/16 1000  azithromycin (ZITHROMAX) tablet 250 mg  Status:  Discontinued     250 mg Oral Daily 12/21/16 0224 12/21/16 1131   12/22/16 1000  doxycycline (VIBRA-TABS) tablet 100 mg  Status:  Discontinued     100 mg Oral Every 12 hours 12/22/16 0942 12/22/16 1446   12/22/16 0000  amphotericin B liposome (AMBISOME) 190 mg in dextrose 5 % 500 mL IVPB  Status:  Discontinued     3 mg/kg  64.2  kg 250 mL/hr over 120 Minutes Intravenous Every 24 hours 12/21/16 2313 12/22/16 0919   12/22/16 0000  flucytosine (ANCOBON) capsule 250 mg  Status:  Discontinued     250 mg Oral Every 6 hours 12/21/16 2314 12/21/16 2325   12/22/16 0000  flucytosine  (ANCOBON) capsule 1,500 mg  Status:  Discontinued     1,500 mg Oral Every 6 hours 12/21/16 2326 12/22/16 0614   12/21/16 1645  cefTRIAXone (ROCEPHIN) 2 g in dextrose 5 % 50 mL IVPB  Status:  Discontinued     2 g 100 mL/hr over 30 Minutes Intravenous Every 24 hours 12/21/16 1539 12/22/16 1446   12/21/16 1645  metroNIDAZOLE (FLAGYL) IVPB 500 mg  Status:  Discontinued     500 mg 100 mL/hr over 60 Minutes Intravenous Every 8 hours 12/21/16 1539 12/21/16 2348   12/21/16 1600  cefTRIAXone (ROCEPHIN) 2 g in dextrose 5 % 50 mL IVPB  Status:  Discontinued     2 g 100 mL/hr over 30 Minutes Intravenous Every 24 hours 12/21/16 1131 12/21/16 1558   12/21/16 1400  vancomycin (VANCOCIN) IVPB 750 mg/150 ml premix  Status:  Discontinued     750 mg 150 mL/hr over 60 Minutes Intravenous Every 8 hours 12/21/16 0142 12/22/16 0942   12/21/16 1000  sulfamethoxazole-trimethoprim (BACTRIM DS,SEPTRA DS) 800-160 MG per tablet 1 tablet  Status:  Discontinued     1 tablet Oral Daily 12/21/16 0058 12/21/16 0100   12/21/16 1000  abacavir-dolutegravir-lamiVUDine (TRIUMEQ) 600-50-300 MG per tablet 1 tablet  Status:  Discontinued     1 tablet Oral Daily 12/21/16 0204 12/22/16 0903   12/21/16 1000  sulfamethoxazole-trimethoprim (BACTRIM DS,SEPTRA DS) 800-160 MG per tablet 1 tablet  Status:  Discontinued     1 tablet Oral Daily 12/21/16 0204 12/23/16 1822   12/21/16 0800  ceFEPIme (MAXIPIME) 2 g in dextrose 5 % 50 mL IVPB  Status:  Discontinued     2 g 100 mL/hr over 30 Minutes Intravenous Every 8 hours 12/21/16 0137 12/21/16 1131   12/21/16 0600  piperacillin-tazobactam (ZOSYN) IVPB 3.375 g  Status:  Discontinued     3.375 g 12.5 mL/hr over 240 Minutes Intravenous Every 8 hours 12/21/16 0058 12/21/16 0132   12/21/16 0200  metroNIDAZOLE (FLAGYL) IVPB 500 mg  Status:  Discontinued     500 mg 100 mL/hr over 60 Minutes Intravenous Every 8 hours 12/21/16 0137 12/22/16 1446   12/21/16 0145  ceFEPIme (MAXIPIME) 2 g in dextrose 5  % 50 mL IVPB     2 g 100 mL/hr over 30 Minutes Intravenous  Once 12/21/16 0137 12/21/16 0358   12/21/16 0145  vancomycin (VANCOCIN) IVPB 1000 mg/200 mL premix     1,000 mg 200 mL/hr over 60 Minutes Intravenous  Once 12/21/16 0142 12/21/16 0429   12/21/16 0130  piperacillin-tazobactam (ZOSYN) IVPB 3.375 g  Status:  Discontinued     3.375 g 100 mL/hr over 30 Minutes Intravenous  Once 12/21/16 0115 12/21/16 0132   12/21/16 0130  azithromycin (ZITHROMAX) powder 1 g     1 g Oral  Once 12/21/16 0125 12/21/16 0328        Objective:   Vitals:   12/23/16 1925 12/23/16 2325 12/24/16 0324 12/24/16 0747  BP: 129/87 105/60 110/65 114/80  Pulse:    92  Resp: (!) 25 (!) 22  17  Temp: (!) 101.3 F (38.5 C) 99.9 F (37.7 C) 98.4 F (36.9 C) 98.4 F (36.9 C)  TempSrc: Oral Oral Oral Oral  SpO2: 97% 99% 99% 97%  Weight:      Height:        Wt Readings from Last 3 Encounters:  12/21/16 64.2 kg (141 lb 8 oz)  12/20/16 65.8 kg (145 lb)  12/17/16 65.8 kg (145 lb)     Intake/Output Summary (Last 24 hours) at 12/24/16 1144 Last data filed at 12/24/16 0600  Gross per 24 hour  Intake          4152.25 ml  Output              300 ml  Net          3852.25 ml     Physical Exam  Awake alert 3, in no apparent distress Oral thrush, cervical lymphadenopathy Good air entry bilaterally, no wheezing or rhonchi S1, S2 heard , no rubs, gallops, murmur. Abdomen soft, nontender nondistended no edema, clubbing or cyanosis     Data Review:    CBC  Recent Labs Lab 12/17/16 2001  12/20/16 6789 12/20/16 1947 12/21/16 0825 12/22/16 0658 12/23/16 0507 12/24/16 0249  WBC 6.3  --  6.9 6.8 6.9 9.3 6.2 8.2  HGB 9.6*  < > 9.9* 9.1* 8.9* 8.3* 8.3* 7.8*  HCT 31.2*  < > 31.1* 28.9* 28.8* 26.6* 26.6* 24.8*  PLT 191  --  131* 110* 111* 95* 128* 64*  MCV 85.5  --  83.4 83.0 83.7 82.9 82.9 83.2  MCH 26.3  --  26.5 26.1 25.9* 25.9* 25.9* 26.2  MCHC 30.8  --  31.8 31.5 30.9 31.2 31.2 31.5  RDW 14.9   --  14.8 14.8 14.9 14.7 15.0 15.1  LYMPHSABS 0.5*  --  0.6*  --   --   --   --   --   MONOABS 0.1  --  0.2  --   --   --   --   --   EOSABS 0.0  --  0.1  --   --   --   --   --   BASOSABS 0.0  --  0.1  --   --   --   --   --   < > = values in this interval not displayed.  Chemistries   Recent Labs Lab 12/17/16 2001  12/20/16 3810 12/20/16 1947 12/21/16 0825 12/22/16 0658 12/23/16 0507 12/24/16 0249  NA 135  < > 128* 127* 126* 125* 129* 133*  K 3.7  < > 3.8 5.4* 3.8 3.4* 4.7 3.3*  CL 97*  < > 94* 98* 98* 95* 101 109  CO2 30  --  _0 21* 19*  GLUCOSE 91  < > 93 89 85 107* 73 104*  BUN 17  < > _1 CREATININE 0.88  < > 0.93 1.02 0.91 0.79 1.12 1.05  CALCIUM 8.9  --  8.2* 7.5* 7.8* 7.3* 7.5* 7.5*  MG  --   --   --   --   --   --  1.3* 2.3  AST 21  --  62* 139*  --  154*  --   --   ALT 13*  --  38 62  --  89*  --   --   ALKPHOS 69  --  75 76  --  82  --   --   BILITOT 1.0  --  0.6 1.3*  --  0.8  --   --   < > = values in this interval not  displayed. ------------------------------------------------------------------------------------------------------------------ No results for input(s): CHOL, HDL, LDLCALC, TRIG, CHOLHDL, LDLDIRECT in the last 72 hours.  No results found for: HGBA1C ------------------------------------------------------------------------------------------------------------------ No results for input(s): TSH, T4TOTAL, T3FREE, THYROIDAB in the last 72 hours.  Invalid input(s): FREET3 ------------------------------------------------------------------------------------------------------------------ No results for input(s): VITAMINB12, FOLATE, FERRITIN, TIBC, IRON, RETICCTPCT in the last 72 hours.  Coagulation profile  Recent Labs Lab 12/22/16 1125  INR 1.20    No results for input(s): DDIMER in the last 72 hours.  Cardiac Enzymes No results for input(s): CKMB, TROPONINI, MYOGLOBIN in the last 168 hours.  Invalid input(s):  CK ------------------------------------------------------------------------------------------------------------------ No results found for: BNP  Inpatient Medications  Scheduled Meds: . azithromycin  1,200 mg Oral Weekly  . flucytosine  1,500 mg Oral Q6H  . lidocaine (PF)  5 mL Other Once  . magic mouthwash  5 mL Oral QID  . potassium chloride  40 mEq Oral NOW  . sulfamethoxazole-trimethoprim  1 tablet Oral Q12H  . valACYclovir  1,000 mg Oral BID   Continuous Infusions: . sodium chloride 100 mL/hr at 12/23/16 0449  . sodium chloride 75 mL/hr at 12/23/16 1845  . amphotericin  B  Liposome (AMBISOME) ADULT IV 200 mg (12/24/16 0953)  . cefTRIAXone (ROCEPHIN)  IV Stopped (12/23/16 2155)  . sodium chloride Stopped (12/23/16 1030)  . sodium chloride Stopped (12/24/16 0850)   PRN Meds:.acetaminophen **OR** acetaminophen, diphenhydrAMINE **OR** diphenhydrAMINE, HYDROmorphone (DILAUDID) injection, [COMPLETED] ketorolac **FOLLOWED BY** ketorolac, ondansetron **OR** ondansetron (ZOFRAN) IV, oxyCODONE, polyethylene glycol, simethicone  Micro Results Recent Results (from the past 240 hour(s))  Culture, blood (Routine X 2) w Reflex to ID Panel     Status: None (Preliminary result)   Collection Time: 12/20/16  6:42 AM  Result Value Ref Range Status   Specimen Description BLOOD LEFT ANTECUBITAL  Final   Special Requests   Final    BOTTLES DRAWN AEROBIC AND ANAEROBIC Blood Culture adequate volume   Culture  Setup Time AEROBIC BOTTLE ONLY YEAST   Final   Culture   Final    NO GROWTH 4 DAYS Performed at [facility], [place] [address], [place], [place] [address]    Report Status PENDING  Incomplete  Culture, blood (Routine X 2) w Reflex to ID Panel     Status: Abnormal (Preliminary result)   Collection Time: 12/20/16  6:54 AM  Result Value Ref Range Status   Specimen Description BLOOD RIGHT ANTECUBITAL  Final   Special Requests IN PEDIATRIC BOTTLE Blood Culture adequate volume  Final    Culture  Setup Time   Final    IN PEDIATRIC BOTTLE YEAST CRITICAL RESULT CALLED TO, READ BACK BY AND VERIFIED WITHJerel Shepherd PHARMD, AT 2633 12/23/16 BY Rush Landmark Performed at [facility], [place] [address] [place], [place] [address]    Culture CRYPTOCOCCUS NEOFORMANS (A)  Final   Report Status PENDING  Incomplete  Blood Culture ID Panel (Reflexed)     Status: None   Collection Time: 12/20/16  6:54 AM  Result Value Ref Range Status   Enterococcus species NOT DETECTED NOT DETECTED Final   Listeria monocytogenes NOT DETECTED NOT DETECTED Final   Staphylococcus species NOT DETECTED NOT DETECTED Final   Staphylococcus aureus NOT DETECTED NOT DETECTED Final   Streptococcus species NOT DETECTED NOT DETECTED Final   Streptococcus agalactiae NOT DETECTED NOT DETECTED Final   Streptococcus pneumoniae NOT DETECTED NOT DETECTED Final   Streptococcus pyogenes NOT DETECTED NOT DETECTED Final   Acinetobacter baumannii NOT DETECTED NOT DETECTED  Final   Enterobacteriaceae species NOT DETECTED NOT DETECTED Final   Enterobacter cloacae complex NOT DETECTED NOT DETECTED Final   Escherichia coli NOT DETECTED NOT DETECTED Final   Klebsiella oxytoca NOT DETECTED NOT DETECTED Final   Klebsiella pneumoniae NOT DETECTED NOT DETECTED Final   Proteus species NOT DETECTED NOT DETECTED Final   Serratia marcescens NOT DETECTED NOT DETECTED Final   Haemophilus influenzae NOT DETECTED NOT DETECTED Final   Neisseria meningitidis NOT DETECTED NOT DETECTED Final   Pseudomonas aeruginosa NOT DETECTED NOT DETECTED Final   Candida albicans NOT DETECTED NOT DETECTED Final   Candida glabrata NOT DETECTED NOT DETECTED Final   Candida krusei NOT DETECTED NOT DETECTED Final   Candida parapsilosis NOT DETECTED NOT DETECTED Final   Candida tropicalis NOT DETECTED NOT DETECTED Final    Comment: Performed at [facility], [place] [address], [place], [place] [address]  Surgical pcr screen     Status: None   Collection  Time: 12/21/16  2:25 AM  Result Value Ref Range Status   MRSA, PCR NEGATIVE NEGATIVE Final   Staphylococcus aureus NEGATIVE NEGATIVE Final    Comment:        The Xpert SA Assay (FDA approved for NASAL specimens in patients over 17 years of age), is one component of a comprehensive surveillance program.  Test performance has been validated by [facility] for patients greater than or equal to 5 year old. It is not intended to diagnose infection nor to guide or monitor treatment.   Blood culture (routine x 2)     Status: None (Preliminary result)   Collection Time: 12/21/16  4:11 AM  Result Value Ref Range Status   Specimen Description BLOOD LEFT ANTECUBITAL  Final   Special Requests   Final    BOTTLES DRAWN AEROBIC ONLY Blood Culture adequate volume   Culture NO GROWTH 3 DAYS  Final   Report Status PENDING  Incomplete  Blood culture (routine x 2)     Status: None (Preliminary result)   Collection Time: 12/21/16  4:11 AM  Result Value Ref Range Status   Specimen Description BLOOD RIGHT HAND  Final   Special Requests IN PEDIATRIC BOTTLE Blood Culture adequate volume  Final   Culture  Setup Time   Final    IN PEDIATRIC BOTTLE YEAST CRITICAL RESULT CALLED TO, READ BACK BY AND VERIFIED WITH: G.HIBBETT PHARMD, 2706 12/24/16 M.CAMPBELL    Culture YEAST  Final   Report Status PENDING  Incomplete  Blood Culture ID Panel (Reflexed)     Status: None   Collection Time: 12/21/16  4:11 AM  Result Value Ref Range Status   Enterococcus species NOT DETECTED NOT DETECTED Final   Listeria monocytogenes NOT DETECTED NOT DETECTED Final   Staphylococcus species NOT DETECTED NOT DETECTED Final   Staphylococcus aureus NOT DETECTED NOT DETECTED Final   Streptococcus species NOT DETECTED NOT DETECTED Final   Streptococcus agalactiae NOT DETECTED NOT DETECTED Final   Streptococcus pneumoniae NOT DETECTED NOT DETECTED Final   Streptococcus pyogenes NOT DETECTED NOT DETECTED Final   Acinetobacter  baumannii NOT DETECTED NOT DETECTED Final   Enterobacteriaceae species NOT DETECTED NOT DETECTED Final   Enterobacter cloacae complex NOT DETECTED NOT DETECTED Final   Escherichia coli NOT DETECTED NOT DETECTED Final   Klebsiella oxytoca NOT DETECTED NOT DETECTED Final   Klebsiella pneumoniae NOT DETECTED NOT DETECTED Final   Proteus species NOT DETECTED NOT DETECTED Final   Serratia marcescens NOT DETECTED NOT DETECTED Final  Haemophilus influenzae NOT DETECTED NOT DETECTED Final   Neisseria meningitidis NOT DETECTED NOT DETECTED Final   Pseudomonas aeruginosa NOT DETECTED NOT DETECTED Final   Candida albicans NOT DETECTED NOT DETECTED Final   Candida glabrata NOT DETECTED NOT DETECTED Final   Candida krusei NOT DETECTED NOT DETECTED Final   Candida parapsilosis NOT DETECTED NOT DETECTED Final   Candida tropicalis NOT DETECTED NOT DETECTED Final  Aerobic/Anaerobic Culture (surgical/deep wound)     Status: Abnormal (Preliminary result)   Collection Time: 12/21/16  1:39 PM  Result Value Ref Range Status   Specimen Description ABSCESS  Final   Special Requests   Final    PERIANAL PATIENT ON FOLLOWING  SEPTRA DS AND ZITHROMAX   Gram Stain   Final    FEW WBC PRESENT,BOTH PMN AND MONONUCLEAR FEW GRAM VARIABLE ROD    Culture (A)  Final    MULTIPLE ORGANISMS PRESENT, NONE PREDOMINANT HOLDING FOR POSSIBLE ANAEROBE    Report Status PENDING  Incomplete  CSF culture with Stat gram stain     Status: None (Preliminary result)   Collection Time: 12/22/16  9:29 AM  Result Value Ref Range Status   Specimen Description BACK  Final   Special Requests Immunocompromised  Final   Gram Stain   Final    CYTOSPIN SMEAR WBC PRESENT, PREDOMINANTLY MONONUCLEAR YEAST CRITICAL RESULT CALLED TO, READ BACK BY AND VERIFIED WITH: RN R BUTLER [phone] MLM    Culture ABUNDANT YEAST IDENTIFICATION TO FOLLOW   Final   Report Status PENDING  Incomplete  Culture, fungus without smear     Status: None  (Preliminary result)   Collection Time: 12/22/16  9:51 AM  Result Value Ref Range Status   Specimen Description CSF  Final   Special Requests Immunocompromised  Final   Culture NO FUNGUS ISOLATED AFTER 1 DAY  Final   Report Status PENDING  Incomplete    Radiology Reports Dg Chest 2 View  Result Date: 12/20/2016 CLINICAL DATA:  Abdominal pain with nausea and vomiting and shortness of breath EXAM: CHEST  2 VIEW COMPARISON:  None. FINDINGS: The heart size and mediastinal contours are within normal limits. Both lungs are clear. The visualized skeletal structures are unremarkable. IMPRESSION: No active cardiopulmonary disease. Electronically Signed   By: Inez Catalina M.D.   On: 12/20/2016 07:48   Ct Head Wo Contrast  Result Date: 12/23/2016 CLINICAL DATA:  Mental status changes EXAM: CT HEAD WITHOUT CONTRAST TECHNIQUE: Contiguous axial images were obtained from the base of the skull through the vertex without intravenous contrast. COMPARISON:  None. FINDINGS: Brain: No evidence of acute infarction, hemorrhage, hydrocephalus, extra-axial collection or mass lesion/mass effect. Vascular: No hyperdense vessel or unexpected calcification. Skull: Normal. Negative for fracture or focal lesion. Sinuses/Orbits: No acute finding. Other: None. IMPRESSION: Normal brain. Electronically Signed   By: Kerby Moors M.D.   On: 12/23/2016 12:11   Ct Head Wo Contrast  Result Date: 12/21/2016 CLINICAL DATA:  HIV. Concern for disseminated infection or meningitis. Lymphadenopathy. Nuchal rigidity. EXAM: CT HEAD WITHOUT CONTRAST TECHNIQUE: Contiguous axial images were obtained from the base of the skull through the vertex without intravenous contrast. COMPARISON:  Head CT 12/17/2016 FINDINGS: Brain: No mass lesion, intraparenchymal hemorrhage or extra-axial collection. No evidence of acute cortical infarct. Brain parenchyma and CSF-containing spaces are normal for age. Vascular: No hyperdense vessel or unexpected  calcification. Skull: Normal visualized skull base, calvarium and extracranial soft tissues. Sinuses/Orbits: No sinus fluid levels or advanced mucosal thickening. No mastoid effusion.  Normal orbits. IMPRESSION: Normal head CT. Electronically Signed   By: Ulyses Jarred M.D.   On: 12/21/2016 19:06   Ct Head Wo Contrast  Result Date: 12/17/2016 CLINICAL DATA:  Headache EXAM: CT HEAD WITHOUT CONTRAST TECHNIQUE: Contiguous axial images were obtained from the base of the skull through the vertex without intravenous contrast. COMPARISON:  None. FINDINGS: Brain: No evidence of acute infarction, hemorrhage, hydrocephalus, extra-axial collection or mass lesion/mass effect. Vascular: No hyperdense vessel or unexpected calcification. Skull: Normal. Negative for fracture or focal lesion. Sinuses/Orbits: No acute finding. Other: None IMPRESSION: No acute intracranial abnormality. Electronically Signed   By: Ashley Royalty M.D.   On: 12/17/2016 21:58   Ct Chest Wo Contrast  Result Date: 12/21/2016 CLINICAL DATA:  Evaluate for lymph node enlargement. 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 31 year old with HIV. EXAM: CT CHEST WITHOUT CONTRAST TECHNIQUE: Multidetector CT imaging of the chest was performed following the standard protocol without IV contrast. COMPARISON:  Chest radiograph 12/20/2016 FINDINGS: Cardiovascular: Normal caliber of the thoracic aorta. No gross abnormality to the pulmonary arteries. Limited evaluation of the vascular structures without intravenous contrast. Mediastinum/Nodes: Limited evaluation for mediastinal and hilar lymphadenopathy on this noncontrast examination. There appears to be subcarinal fullness measuring up to 1.8 cm which probably represents enlarged lymph nodes. Soft tissue fullness in the right hilum on sequence 4, image 75 measures 1.6 cm in the short axis. Multiple small lymph nodes scattered throughout the bilateral axillary regions. There appears to be multiple lymph nodes in the paratracheal  region. There is low density in the anterior mediastinum which is nonspecific but could be associated with thymus. Cannot exclude right supraclavicular lymphadenopathy. Lungs/Pleura: There is trace right pleural fluid versus right pleural thickening. Trachea and mainstem bronchi are patent. Patchy areas of airspace disease in both lower lobes and both upper lobes. Small cavitary lesion in the right upper lobe measuring 6 mm on sequence 6, image 47. Scattered areas of interstitial thickening in the right lung. Upper Abdomen: Images of the upper abdomen are unremarkable. Musculoskeletal: No acute bone abnormality. IMPRESSION: Bilateral airspace disease with a small cavitary lesion in the right upper lobe. Findings compatible with pneumonia. Trace right pleural fluid versus right pleural thickening. Mediastinal and hilar lymphadenopathy. Evaluation of the lymphadenopathy is limited on this noncontrast examination. There are also prominent bilateral axillary lymph nodes. Electronically Signed   By: Markus Daft M.D.   On: 12/21/2016 21:15   Ct Abdomen Pelvis W Contrast  Result Date: 12/21/2016 CLINICAL DATA:  Known rectal abscesses, status post incision and drainage. Personal history of HIV. Initial encounter. EXAM: CT ABDOMEN AND PELVIS WITH CONTRAST TECHNIQUE: Multidetector CT imaging of the abdomen and pelvis was performed using the standard protocol following bolus administration of intravenous contrast. CONTRAST:  168m ISOVUE-300 IOPAMIDOL (ISOVUE-300) INJECTION 61% COMPARISON:  CT of the abdomen and pelvis performed 12/17/2016 FINDINGS: Lower chest: Mild right basilar airspace opacity raises concern for pneumonia. A 1.6 cm azygoesophageal recess node is seen. Hepatobiliary: The liver is unremarkable in appearance. A stone is noted dependently within the gallbladder. The gallbladder is otherwise unremarkable. The common bile duct remains normal in caliber. Pancreas: The pancreas is within normal limits. Spleen:  The spleen is unremarkable in appearance. Adrenals/Urinary Tract: The adrenal glands are unremarkable in appearance. The kidneys are within normal limits. There is no evidence of hydronephrosis. No renal or ureteral stones are identified. No perinephric stranding is seen. Stomach/Bowel: The stomach is unremarkable in appearance. The small bowel is within normal limits. The patient is  status post appendectomy. Mild wall thickening is noted along the distal sigmoid colon and rectum, raising concern for proctitis. The remainder of the colon is filled with contrast and is unremarkable in appearance. Vascular/Lymphatic: The abdominal aorta is unremarkable in appearance. The inferior vena cava is grossly unremarkable. Prominent retroperitoneal nodes are seen, measuring up to 1.1 cm in short axis. Prominent nodes are also noted at both sides of the pelvis, measuring up to 1.7 cm in short axis. Enlarged bilateral inguinal nodes are seen, measuring up to 1.6 cm in short axis. Reproductive: The bladder is mildly distended and contains trace contrast. The prostate remains normal in size. Other: Trace ascites is noted about the right side of the abdomen. Diffuse presacral soft tissue inflammation is again noted. There is diffuse soft tissue edema tracking about the anorectal canal, without definite evidence of abscess. Edema is seen tracking along the right side of the gluteal cleft, similar in appearance to the prior study. This may reflect a persistent subcutaneous abscess, given stability from the prior study, though not well assessed on delayed images. A small drainage catheter is noted posterior to the anorectal canal. Scrotal wall edema is noted, worse on the right, with small bilateral hydroceles. Musculoskeletal: No acute osseous abnormalities are identified. The visualized musculature is unremarkable in appearance. IMPRESSION: 1. Edema tracking along the right side of the gluteal cleft is similar in appearance to the  prior study. This reflected a subcutaneous abscess on the prior study. This most likely reflects a persistent small subcutaneous abscess. 2. Diffuse soft tissue edema tracking about the anorectal canal, without additional abscess. Diffuse presacral soft tissue inflammation noted. 3. Scrotal wall edema, worse on the right, with small bilateral hydroceles. 4. Mild right basilar airspace opacity raises concern for pneumonia. 5. Mild wall thickening along the distal sigmoid colon and rectum, raising concern for proctitis. 6. Enlarged azygoesophageal recess, retroperitoneal, pelvic sidewall and bilateral inguinal nodes seen, measuring up to 1.7 cm in short axis. This likely reflects the underlying infection. 7. Cholelithiasis.  Gallbladder otherwise unremarkable. Electronically Signed   By: Garald Balding M.D.   On: 12/21/2016 01:29   Ct Abdomen Pelvis W Contrast  Result Date: 12/17/2016 CLINICAL DATA:  Known rectal abscesses. Frequent vomiting past 4 days. Hypotension earlier today. HIV positive. EXAM: CT ABDOMEN AND PELVIS WITH CONTRAST TECHNIQUE: Multidetector CT imaging of the abdomen and pelvis was performed using the standard protocol following bolus administration of intravenous contrast. CONTRAST:  <See Chart> ISOVUE-300 IOPAMIDOL (ISOVUE-300) INJECTION 61% COMPARISON:  10/31/2016 FINDINGS: Lower chest: Lung bases are within normal. Hepatobiliary: Single punctate gallstone. Liver and biliary tree are within normal. Pancreas: Within normal. Spleen: Within normal. Adrenals/Urinary Tract: Adrenal glands are normal. Kidneys are normal in size without hydronephrosis or nephrolithiasis. Ureters and bladder are normal. Stomach/Bowel: Stomach and small bowel are within normal. Appendix is within normal. Colon is unremarkable. Vascular/Lymphatic: Vascular structures are within normal. Mildly prominent inguinal lymph nodes unchanged. Reproductive: Unremarkable. Other: Slight interval decrease in size of a medial right  gluteal subcutaneous abscess measuring 1 x 3.3 cm (previously 1.5 x 3.8 cm). Small caliber drain projects very superficial lesion over the lower gluteal crease just above this subcutaneous abscess. Mild wall thickening of the rectum with small round enhancing right perirectal abscess measuring 0.8 x 1.6 cm. Subtle perirectal inflammation. Few small perirectal lymph nodes are present. Slight worsening external iliac chain adenopathy with the largest node over the right iliac chain measuring 1.6 x 3.3 cm. Musculoskeletal: Within normal. IMPRESSION: Persistent rectal  wall thickening and perirectal inflammation with new small right perirectal abscess measuring 0.8 x 1.6 cm. Interval decrease in size of a medial right gluteal subcutaneous abscess measuring 1 x 3.3 cm (previously 1.5 x 3.8 cm). Bilateral inguinal and iliac chain/ pelvic adenopathy slightly worse. Findings likely related to patient's ongoing infection described above as well as known HIV. Minimal cholelithiasis unchanged. These results were called by telephone at the time of interpretation on 12/17/2016 at 10:13 pm to Dr. Quintella Reichert , who verbally acknowledged these results. Electronically Signed   By: Marin Olp M.D.   On: 12/17/2016 22:14   Dg Chest Port 1 View  Result Date: 12/23/2016 CLINICAL DATA:  Generalized body aches 1 week. Chest pain. Short of breath. HIV positive. EXAM: PORTABLE CHEST 1 VIEW COMPARISON:  12/20/2016 FINDINGS: The heart is borderline enlarged. Central and basilar hazy opacities of developed. No pneumothorax. There is new prominence of the hilar regions. IMPRESSION: New hilar prominence and bilateral hazy airspace opacities compared with 3 days ago. Differential diagnosis includes new pulmonary edema with increased size of the hilar lymph nodes or an inflammatory process. Opportunistic infection should be considered. Electronically Signed   By: Marybelle Killings M.D.   On: 12/23/2016 09:44     ELGERGAWY, DAWOOD M.D on  12/24/2016 at 11:44 AM  Between 7am to 7pm - Pager - [phone]  After 7pm go to [email or web address] - password [facility]  [facility] -  Office  [phone]

## 2016-12-25 DIAGNOSIS — R591 Generalized enlarged lymph nodes: Secondary | ICD-10-CM

## 2016-12-25 DIAGNOSIS — B20 Human immunodeficiency virus [HIV] disease: Secondary | ICD-10-CM

## 2016-12-25 DIAGNOSIS — G932 Benign intracranial hypertension: Secondary | ICD-10-CM

## 2016-12-25 LAB — CULTURE, BLOOD (ROUTINE X 2): Special Requests: ADEQUATE

## 2016-12-25 LAB — CSF CELL COUNT WITH DIFFERENTIAL
LYMPHS CSF: 25 % — AB (ref 40–80)
RBC COUNT CSF: 9 /mm3 — AB
SEGMENTED NEUTROPHILS-CSF: 75 % — AB (ref 0–6)
Tube #: 4
WBC, CSF: 29 /mm3 (ref 0–5)

## 2016-12-25 LAB — BASIC METABOLIC PANEL
Anion gap: 6 (ref 5–15)
BUN: 6 mg/dL (ref 6–20)
CALCIUM: 7.9 mg/dL — AB (ref 8.9–10.3)
CO2: 18 mmol/L — AB (ref 22–32)
CREATININE: 0.82 mg/dL (ref 0.61–1.24)
Chloride: 112 mmol/L — ABNORMAL HIGH (ref 101–111)
GLUCOSE: 84 mg/dL (ref 65–99)
Potassium: 4.1 mmol/L (ref 3.5–5.1)
Sodium: 136 mmol/L (ref 135–145)

## 2016-12-25 LAB — PROTEIN AND GLUCOSE, CSF
Glucose, CSF: 45 mg/dL (ref 40–70)
Total  Protein, CSF: 122 mg/dL — ABNORMAL HIGH (ref 15–45)

## 2016-12-25 LAB — CRYPTOCOCCAL ANTIGEN, CSF
Crypto Ag: POSITIVE — AB
Cryptococcal Ag Titer: >2560 — AB

## 2016-12-25 LAB — CBC
HCT: 24.1 % — ABNORMAL LOW (ref 39.0–52.0)
Hemoglobin: 7.5 g/dL — ABNORMAL LOW (ref 13.0–17.0)
MCH: 25.6 pg — AB (ref 26.0–34.0)
MCHC: 31.1 g/dL (ref 30.0–36.0)
MCV: 82.3 fL (ref 78.0–100.0)
PLATELETS: 79 10*3/uL — AB (ref 150–400)
RBC: 2.93 MIL/uL — ABNORMAL LOW (ref 4.22–5.81)
RDW: 15.3 % (ref 11.5–15.5)
WBC: 8.7 10*3/uL (ref 4.0–10.5)

## 2016-12-25 LAB — PHOSPHORUS: Phosphorus: 2.2 mg/dL — ABNORMAL LOW (ref 2.5–4.6)

## 2016-12-25 LAB — CSF CULTURE

## 2016-12-25 LAB — CSF CULTURE W GRAM STAIN

## 2016-12-25 LAB — PROCALCITONIN: Procalcitonin: 4.24 ng/mL

## 2016-12-25 LAB — MAGNESIUM: MAGNESIUM: 1.7 mg/dL (ref 1.7–2.4)

## 2016-12-25 MED ORDER — SODIUM PHOSPHATES 45 MMOLE/15ML IV SOLN
20.0000 mmol | Freq: Once | INTRAVENOUS | Status: AC
  Administered 2016-12-25: 20 mmol via INTRAVENOUS
  Filled 2016-12-25: qty 6.67

## 2016-12-25 MED ORDER — LORAZEPAM 2 MG/ML IJ SOLN
1.0000 mg | Freq: Once | INTRAMUSCULAR | Status: AC
  Administered 2016-12-25: 1 mg via INTRAVENOUS
  Filled 2016-12-25: qty 1

## 2016-12-25 MED ORDER — HYDROMORPHONE HCL 1 MG/ML IJ SOLN: 1.0000 mg | Freq: Once | INTRAMUSCULAR | Status: AC

## 2016-12-25 MED ORDER — MAGNESIUM SULFATE IN D5W 1-5 GM/100ML-% IV SOLN
1.0000 g | Freq: Once | INTRAVENOUS | Status: AC
  Administered 2016-12-25: 1 g via INTRAVENOUS
  Filled 2016-12-25: qty 100

## 2016-12-25 MED ORDER — HYDROMORPHONE HCL 1 MG/ML IJ SOLN
0.5000 mg | Freq: Four times a day (QID) | INTRAMUSCULAR | Status: DC | PRN
  Administered 2016-12-26: 0.5 mg via INTRAVENOUS
  Filled 2016-12-25: qty 1
  Filled 2016-12-25 (×2): qty 0.5

## 2016-12-25 MED ORDER — OXYCODONE HCL 5 MG PO TABS
5.0000 mg | ORAL_TABLET | Freq: Four times a day (QID) | ORAL | Status: DC | PRN
  Administered 2016-12-25: 10 mg via ORAL
  Filled 2016-12-25: qty 2

## 2016-12-25 MED ORDER — HYDROMORPHONE HCL 1 MG/ML IJ SOLN
INTRAMUSCULAR | Status: AC
  Administered 2016-12-25: 1 mg
  Filled 2016-12-25: qty 1

## 2016-12-25 MED ORDER — LIDOCAINE HCL (PF) 1 % IJ SOLN
INTRAMUSCULAR | Status: AC
  Administered 2016-12-25: 16:00:00
  Filled 2016-12-25: qty 5

## 2016-12-25 NOTE — Progress Notes (Signed)
Subjective:  "My brain is killing me!" further elucidated that he began having severe headaches this am. NO visual problems at this time   Antibiotics:  Anti-infectives    Start     Dose/Rate Route Frequency Ordered Stop   12/23/16 2200  sulfamethoxazole-trimethoprim (BACTRIM DS,SEPTRA DS) 800-160 MG per tablet 1 tablet     1 tablet Oral Every 12 hours 12/23/16 1822     12/23/16 1900  cefTRIAXone (ROCEPHIN) 2 g in dextrose 5 % 50 mL IVPB     2 g 100 mL/hr over 30 Minutes Intravenous Every 24 hours 12/23/16 1821     12/23/16 0900  amphotericin B liposome (AMBISOME) 200 mg in dextrose 5 % 500 mL IVPB     200 mg 250 mL/hr over 120 Minutes Intravenous Every 24 hours 12/22/16 0919     12/23/16 0800  azithromycin (ZITHROMAX) tablet 1,200 mg     1,200 mg Oral Weekly 12/22/16 0811     12/22/16 2200  valACYclovir (VALTREX) tablet 1,000 mg     1,000 mg Oral 2 times daily 12/22/16 1611 12/29/16 2159   12/22/16 1800  amoxicillin-clavulanate (AUGMENTIN) 875-125 MG per tablet 1 tablet  Status:  Discontinued     1 tablet Oral 2 times daily with meals 12/22/16 1446 12/23/16 1821   12/22/16 1800  doxycycline (VIBRA-TABS) tablet 100 mg  Status:  Discontinued     100 mg Oral 2 times daily with meals 12/22/16 1446 12/23/16 1821   12/22/16 1200  flucytosine (ANCOBON) capsule 1,500 mg     1,500 mg Oral Every 6 hours 12/22/16 0614     12/22/16 1000  azithromycin (ZITHROMAX) tablet 250 mg  Status:  Discontinued     250 mg Oral Daily 12/21/16 0224 12/21/16 1131   12/22/16 1000  doxycycline (VIBRA-TABS) tablet 100 mg  Status:  Discontinued     100 mg Oral Every 12 hours 12/22/16 0942 12/22/16 1446   12/22/16 0000  amphotericin B liposome (AMBISOME) 190 mg in dextrose 5 % 500 mL IVPB  Status:  Discontinued     3 mg/kg  64.2 kg 250 mL/hr over 120 Minutes Intravenous Every 24 hours 12/21/16 2313 12/22/16 0919   12/22/16 0000  flucytosine (ANCOBON) capsule 250 mg  Status:  Discontinued     250 mg  Oral Every 6 hours 12/21/16 2314 12/21/16 2325   12/22/16 0000  flucytosine (ANCOBON) capsule 1,500 mg  Status:  Discontinued     1,500 mg Oral Every 6 hours 12/21/16 2326 12/22/16 0614   12/21/16 1645  cefTRIAXone (ROCEPHIN) 2 g in dextrose 5 % 50 mL IVPB  Status:  Discontinued     2 g 100 mL/hr over 30 Minutes Intravenous Every 24 hours 12/21/16 1539 12/22/16 1446   12/21/16 1645  metroNIDAZOLE (FLAGYL) IVPB 500 mg  Status:  Discontinued     500 mg 100 mL/hr over 60 Minutes Intravenous Every 8 hours 12/21/16 1539 12/21/16 2348   12/21/16 1600  cefTRIAXone (ROCEPHIN) 2 g in dextrose 5 % 50 mL IVPB  Status:  Discontinued     2 g 100 mL/hr over 30 Minutes Intravenous Every 24 hours 12/21/16 1131 12/21/16 1558   12/21/16 1400  vancomycin (VANCOCIN) IVPB 750 mg/150 ml premix  Status:  Discontinued     750 mg 150 mL/hr over 60 Minutes Intravenous Every 8 hours 12/21/16 0142 12/22/16 0942   12/21/16 1000  sulfamethoxazole-trimethoprim (BACTRIM DS,SEPTRA DS) 800-160 MG per tablet 1 tablet  Status:  Discontinued     1 tablet Oral Daily 12/21/16 0058 12/21/16 0100   12/21/16 1000  abacavir-dolutegravir-lamiVUDine (TRIUMEQ) 600-50-300 MG per tablet 1 tablet  Status:  Discontinued     1 tablet Oral Daily 12/21/16 0204 12/22/16 0903   12/21/16 1000  sulfamethoxazole-trimethoprim (BACTRIM DS,SEPTRA DS) 800-160 MG per tablet 1 tablet  Status:  Discontinued     1 tablet Oral Daily 12/21/16 0204 12/23/16 1822   12/21/16 0800  ceFEPIme (MAXIPIME) 2 g in dextrose 5 % 50 mL IVPB  Status:  Discontinued     2 g 100 mL/hr over 30 Minutes Intravenous Every 8 hours 12/21/16 0137 12/21/16 1131   12/21/16 0600  piperacillin-tazobactam (ZOSYN) IVPB 3.375 g  Status:  Discontinued     3.375 g 12.5 mL/hr over 240 Minutes Intravenous Every 8 hours 12/21/16 0058 12/21/16 0132   12/21/16 0200  metroNIDAZOLE (FLAGYL) IVPB 500 mg  Status:  Discontinued     500 mg 100 mL/hr over 60 Minutes Intravenous Every 8 hours  12/21/16 0137 12/22/16 1446   12/21/16 0145  ceFEPIme (MAXIPIME) 2 g in dextrose 5 % 50 mL IVPB     2 g 100 mL/hr over 30 Minutes Intravenous  Once 12/21/16 0137 12/21/16 0358   12/21/16 0145  vancomycin (VANCOCIN) IVPB 1000 mg/200 mL premix     1,000 mg 200 mL/hr over 60 Minutes Intravenous  Once 12/21/16 0142 12/21/16 0429   12/21/16 0130  piperacillin-tazobactam (ZOSYN) IVPB 3.375 g  Status:  Discontinued     3.375 g 100 mL/hr over 30 Minutes Intravenous  Once 12/21/16 0115 12/21/16 0132   12/21/16 0130  azithromycin (ZITHROMAX) powder 1 g     1 g Oral  Once 12/21/16 0125 12/21/16 0328      Medications: Scheduled Meds: . azithromycin  1,200 mg Oral Weekly  . flucytosine  1,500 mg Oral Q6H  . lidocaine (PF)  5 mL Other Once  . magic mouthwash  5 mL Oral QID  . sulfamethoxazole-trimethoprim  1 tablet Oral Q12H  . valACYclovir  1,000 mg Oral BID   Continuous Infusions: . sodium chloride 100 mL/hr at 12/23/16 0449  . amphotericin  B  Liposome (AMBISOME) ADULT IV Stopped (12/25/16 1246)  . cefTRIAXone (ROCEPHIN)  IV 2 g (12/25/16 1828)  . sodium chloride Stopped (12/25/16 0835)  . sodium chloride Stopped (12/25/16 1330)   PRN Meds:.acetaminophen **OR** acetaminophen, diphenhydrAMINE **OR** diphenhydrAMINE, HYDROmorphone (DILAUDID) injection, [COMPLETED] ketorolac **FOLLOWED BY** ketorolac, ondansetron **OR** ondansetron (ZOFRAN) IV, oxyCODONE, polyethylene glycol, simethicone    Objective: Weight change:   Intake/Output Summary (Last 24 hours) at 12/25/16 1833 Last data filed at 12/25/16 1447  Gross per 24 hour  Intake          4406.67 ml  Output             1825 ml  Net          2581.67 ml   Blood pressure 129/86, pulse (!) 30, temperature 98.2 F (36.8 C), temperature source Axillary, resp. rate 18, height 5\' 6"  (1.676 m), weight 141 lb 8 oz (64.2 kg), SpO2 96 %. Temp:  [98.1 F (36.7 C)-100.7 F (38.2 C)] 98.2 F (36.8 C) (05/27 1700) Pulse Rate:  [30-104] 30  (05/27 0600) Resp:  [13-23] 18 (05/27 1700) BP: (95-129)/(57-104) 129/86 (05/27 1134) SpO2:  [65 %-99 %] 96 % (05/27 1700)  Physical Exam: General: Alert and awake, oriented x3, not wearing any clothes except for underwear,  HEENT: anicteric sclera, pupils reactive to light  and accommodation, EOMI rash on face  CVS regular rate, normal r,  no murmur rubs or gallops Chest: clear to auscultation bilaterally, no wheezing, rales or rhonchi Abdomen: soft nontender, nondistended, normal bowel sounds, Extremities: no  clubbing or edema noted bilaterally Skin: Multiple tattoos his abscess site was not examined, bandage over perirectal area Neuro: nonfocal  CBC:  CBC Latest Ref Rng & Units 12/25/2016 12/24/2016 12/23/2016  WBC 4.0 - 10.5 K/uL 8.7 8.2 6.2  Hemoglobin 13.0 - 17.0 g/dL 7.5(L) 7.8(L) 8.3(L)  Hematocrit 39.0 - 52.0 % 24.1(L) 24.8(L) 26.6(L)  Platelets 150 - 400 K/uL 79(L) 64(L) 128(L)  \   BMET  Recent Labs  12/24/16 0249 12/25/16 0345  NA 133* 136  K 3.3* 4.1  CL 109 112*  CO2 19* 18*  GLUCOSE 104* 84  BUN 10 6  CREATININE 1.05 0.82  CALCIUM 7.5* 7.9*     Liver Panel  No results for input(s): PROT, ALBUMIN, AST, ALT, ALKPHOS, BILITOT, BILIDIR, IBILI in the last 72 hours.     Sedimentation Rate No results for input(s): ESRSEDRATE in the last 72 hours. C-Reactive Protein No results for input(s): CRP in the last 72 hours.  Micro Results: Recent Results (from the past 720 hour(s))  Culture, blood (Routine X 2) w Reflex to ID Panel     Status: Abnormal (Preliminary result)   Collection Time: 12/20/16  6:42 AM  Result Value Ref Range Status   Specimen Description BLOOD LEFT ANTECUBITAL  Final   Special Requests   Final    BOTTLES DRAWN AEROBIC AND ANAEROBIC Blood Culture adequate volume   Culture  Setup Time   Final    AEROBIC BOTTLE ONLY YEAST CRITICAL VALUE NOTED.  VALUE IS CONSISTENT WITH PREVIOUSLY REPORTED AND CALLED VALUE. Performed at [facility], [place] [address] [place], [place] [address]    Culture CRYPTOCOCCUS NEOFORMANS (A)  Final   Report Status PENDING  Incomplete  Culture, blood (Routine X 2) w Reflex to ID Panel     Status: Abnormal   Collection Time: 12/20/16  6:54 AM  Result Value Ref Range Status   Specimen Description BLOOD RIGHT ANTECUBITAL  Final   Special Requests IN PEDIATRIC BOTTLE Blood Culture adequate volume  Final   Culture  Setup Time   Final    IN PEDIATRIC BOTTLE YEAST CRITICAL RESULT CALLED TO, READ BACK BY AND VERIFIED WITHJerel Shepherd PHARMD, AT 7124 12/23/16 BY Rush Landmark Performed at [facility], [place] [address] [place], [place] [address]    Culture CRYPTOCOCCUS NEOFORMANS (A)  Final   Report Status 12/25/2016 FINAL  Final  Blood Culture ID Panel (Reflexed)     Status: None   Collection Time: 12/20/16  6:54 AM  Result Value Ref Range Status   Enterococcus species NOT DETECTED NOT DETECTED Final   Listeria monocytogenes NOT DETECTED NOT DETECTED Final   Staphylococcus species NOT DETECTED NOT DETECTED Final   Staphylococcus aureus NOT DETECTED NOT DETECTED Final   Streptococcus species NOT DETECTED NOT DETECTED Final   Streptococcus agalactiae NOT DETECTED NOT DETECTED Final   Streptococcus pneumoniae NOT DETECTED NOT DETECTED Final   Streptococcus pyogenes NOT DETECTED NOT DETECTED Final   Acinetobacter baumannii NOT DETECTED NOT DETECTED Final   Enterobacteriaceae species NOT DETECTED NOT DETECTED Final   Enterobacter cloacae complex NOT DETECTED NOT DETECTED Final   Escherichia coli NOT DETECTED NOT DETECTED Final   Klebsiella oxytoca NOT DETECTED NOT DETECTED Final   Klebsiella pneumoniae NOT  DETECTED NOT DETECTED Final   Proteus species NOT DETECTED NOT DETECTED Final   Serratia marcescens NOT DETECTED NOT DETECTED Final   Haemophilus influenzae NOT DETECTED NOT DETECTED Final   Neisseria meningitidis NOT DETECTED NOT DETECTED Final   Pseudomonas aeruginosa NOT DETECTED NOT  DETECTED Final   Candida albicans NOT DETECTED NOT DETECTED Final   Candida glabrata NOT DETECTED NOT DETECTED Final   Candida krusei NOT DETECTED NOT DETECTED Final   Candida parapsilosis NOT DETECTED NOT DETECTED Final   Candida tropicalis NOT DETECTED NOT DETECTED Final    Comment: Performed at [facility], [place] [address] [place], [place] [address]  Surgical pcr screen     Status: None   Collection Time: 12/21/16  2:25 AM  Result Value Ref Range Status   MRSA, PCR NEGATIVE NEGATIVE Final   Staphylococcus aureus NEGATIVE NEGATIVE Final    Comment:        The Xpert SA Assay (FDA approved for NASAL specimens in patients over 29 years of age), is one component of a comprehensive surveillance program.  Test performance has been validated by [facility] for patients greater than or equal to 10 year old. It is not intended to diagnose infection nor to guide or monitor treatment.   Blood culture (routine x 2)     Status: Abnormal (Preliminary result)   Collection Time: 12/21/16  4:11 AM  Result Value Ref Range Status   Specimen Description BLOOD LEFT ANTECUBITAL  Final   Special Requests   Final    BOTTLES DRAWN AEROBIC ONLY Blood Culture adequate volume   Culture  Setup Time   Final    YEAST AEROBIC BOTTLE ONLY CRITICAL VALUE NOTED.  VALUE IS CONSISTENT WITH PREVIOUSLY REPORTED AND CALLED VALUE.    Culture CRYPTOCOCCUS NEOFORMANS (A)  Final   Report Status PENDING  Incomplete  Blood culture (routine x 2)     Status: Abnormal (Preliminary result)   Collection Time: 12/21/16  4:11 AM  Result Value Ref Range Status   Specimen Description BLOOD RIGHT HAND  Final   Special Requests IN PEDIATRIC BOTTLE Blood Culture adequate volume  Final   Culture  Setup Time   Final    IN PEDIATRIC BOTTLE YEAST CRITICAL RESULT CALLED TO, READ BACK BY AND VERIFIED WITH: G.HIBBETT PHARMD, 7510 12/24/16 M.CAMPBELL    Culture CRYPTOCOCCUS NEOFORMANS (A)  Final   Report Status PENDING   Incomplete  Blood Culture ID Panel (Reflexed)     Status: None   Collection Time: 12/21/16  4:11 AM  Result Value Ref Range Status   Enterococcus species NOT DETECTED NOT DETECTED Final   Listeria monocytogenes NOT DETECTED NOT DETECTED Final   Staphylococcus species NOT DETECTED NOT DETECTED Final   Staphylococcus aureus NOT DETECTED NOT DETECTED Final   Streptococcus species NOT DETECTED NOT DETECTED Final   Streptococcus agalactiae NOT DETECTED NOT DETECTED Final   Streptococcus pneumoniae NOT DETECTED NOT DETECTED Final   Streptococcus pyogenes NOT DETECTED NOT DETECTED Final   Acinetobacter baumannii NOT DETECTED NOT DETECTED Final   Enterobacteriaceae species NOT DETECTED NOT DETECTED Final   Enterobacter cloacae complex NOT DETECTED NOT DETECTED Final   Escherichia coli NOT DETECTED NOT DETECTED Final   Klebsiella oxytoca NOT DETECTED NOT DETECTED Final   Klebsiella pneumoniae NOT DETECTED NOT DETECTED Final   Proteus species NOT DETECTED NOT DETECTED Final   Serratia marcescens NOT DETECTED NOT DETECTED Final   Haemophilus influenzae NOT DETECTED NOT DETECTED Final   Neisseria meningitidis  NOT DETECTED NOT DETECTED Final   Pseudomonas aeruginosa NOT DETECTED NOT DETECTED Final   Candida albicans NOT DETECTED NOT DETECTED Final   Candida glabrata NOT DETECTED NOT DETECTED Final   Candida krusei NOT DETECTED NOT DETECTED Final   Candida parapsilosis NOT DETECTED NOT DETECTED Final   Candida tropicalis NOT DETECTED NOT DETECTED Final  Aerobic/Anaerobic Culture (surgical/deep wound)     Status: Abnormal (Preliminary result)   Collection Time: 12/21/16  1:39 PM  Result Value Ref Range Status   Specimen Description ABSCESS  Final   Special Requests   Final    PERIANAL PATIENT ON FOLLOWING  SEPTRA DS AND ZITHROMAX   Gram Stain   Final    FEW WBC PRESENT,BOTH PMN AND MONONUCLEAR FEW GRAM VARIABLE ROD    Culture (A)  Final    MULTIPLE ORGANISMS PRESENT, NONE PREDOMINANT HOLDING  FOR POSSIBLE ANAEROBE    Report Status PENDING  Incomplete  CSF culture with Stat gram stain     Status: None   Collection Time: 12/22/16  9:29 AM  Result Value Ref Range Status   Specimen Description BACK  Final   Special Requests Immunocompromised  Final   Gram Stain   Final    CYTOSPIN SMEAR WBC PRESENT, PREDOMINANTLY MONONUCLEAR YEAST CRITICAL RESULT CALLED TO, READ BACK BY AND VERIFIED WITH: RN Morton Peters [id number] MLM    Culture ABUNDANT CRYPTOCOCCUS NEOFORMANS  Final   Report Status 12/25/2016 FINAL  Final  Culture, fungus without smear     Status: Abnormal (Preliminary result)   Collection Time: 12/22/16  9:51 AM  Result Value Ref Range Status   Specimen Description CSF  Final   Special Requests Immunocompromised  Final   Culture CRYPTOCOCCUS NEOFORMANS (A)  Final   Report Status PENDING  Incomplete  CSF culture with Stat gram stain     Status: None (Preliminary result)   Collection Time: 12/25/16  5:04 PM  Result Value Ref Range Status   Specimen Description CSF  Final   Special Requests Immunocompromised  Final   Gram Stain   Final    WBC PRESENT,BOTH PMN AND MONONUCLEAR BUDDING YEAST SEEN CYTOSPIN SMEAR CRITICAL RESULT CALLED TO, READ BACK BY AND VERIFIED WITH: L TURNER,RN AT 1821 12/25/16 BY L BENFIELD    Culture PENDING  Incomplete   Report Status PENDING  Incomplete    Studies/Results: No results found.    Assessment/Plan:  INTERVAL HISTORY:  Rash resolved  Severe headache  Principal Problem:   Perirectal abscess Active Problems:   Chronic pain   AIDS (acquired immune deficiency syndrome) (HCC)   Hyponatremia   Normocytic anemia   Elevated LFTs   Pulmonary infiltrates   Hypomagnesemia   Encephalopathy acute   SIRS (systemic inflammatory response syndrome) (HCC)   Acute respiratory failure (HCC)   Rectal abscess   Cryptococcal meningoencephalitis (HCC)   Cryptococcosis (HCC)   Cavitary pneumonia   Diffuse lymphadenopathy   Drug  rash    William Pena is a 31 y.o. male with  HIV/AIDS and now disseminated cryptococcal infection with cryptococcemia, cryptococcal meningitis, cavitary PNA (likely from crypto as well but could be TB as well), perirectal abscess, lymphadenopathy.  #1 Cryptococcemia with meningitis:   I SUSPECTED his ICP was high and indeed LP showed it to be > 55 cm H20  I WOULD REPEAT HIS LP WITHIN THE NEXT 24 to 48 hours  He has a very HIGH BURDEN of Cryptococcal infection  I suspect he will continue to require every other  to even daily LP and if this ends up being the case and continues he would need Neurosurgical consultation for placement of SHUNT  Continue ampho lipid + flucytosine, replete electrolytes and watch for hematological toxicity from flucytosine since we can't monitor the levels here.   He will need 2 weeks of therapy followed by high-dose fluconazole.  I would definitely DELAY his ARV x 5 weeks and THEN monitor VERY closely for IRIS   #2 HIV and AIDS: Fairly scandalous and unacceptable that he was not administered his antiretrovirals in prison especially given the degree of immune compromise he had when he was diagnosed  Now we should delay his ARV for at least 5 weeks   He will need PCP prophylaxis in the interim certainly  NOTE HIS MOTHER IS AWARE OF HIS DIAGNOSIS and she would make decisions IF he became incapicated  #3 perirectal abscess continue current antibiotics including ceftriaxone and Bactrim which will provide PCP prophylaxis as well.  Greatly appreciate general surgery.    #4 cavitary pneumonia: Could certainly be due to cryptococcus but I worry he could have tuberculosis I think he needs a bronchoscopy to get deep specimens for AFB stain and culture fungal stain and cultures bacterial cultures biopsy if needed  He should be quite capable of undergoing bronchoscopy, though would give this lower priority to repeat LP's it would be nice to take him out of  airborne as I suspect this is cryptococcal lung infection as well.  #4 Rash: resolved presumed from his pcn but he is tolerating ceph  #5 LA: might benefit from biopsy. This could be IRIS to M avium. AFB blood cultures have been sent  Right now focus needs to be on managing his ICP  The last patient I had who had pressures this high went blind. Monitor for headaches and in particular peripheral vision changes. Again daily to every other day LP may be needed  I spent greater than 120 minutes with the patient including greater than 50% of time in face to face counsel of the patient re his HIV, AIDS, cryptocococal meningoencephalitis, perirectal abscess in protracted repeat counselling re need for LP  and in coordination of his care.  Dr. Baxter Flattery is back tomorrow.      LOS: 4 days   Alcide Evener 12/25/2016, 6:33 PM

## 2016-12-25 NOTE — Procedures (Signed)
  Permit: Risks benefits of the procedure were reviewed thoroughly with the patient. Benefits being lowering of his likely HIGH ICP. Risks not limited to pain, infection, trauma to nerve, bleeding.   He signed the permission and gave informed consent. He did request to talk to his mother and talked to her and his grandmother before agreeing to go forward with the procedure   Description: patient was placed in fetal position with legs tucked in head down on lateral position  Iliac crest palpated, L4 and L5 vertebral bodies palpated. Attempts to numb superficial skin were initially resisted by patient moving and jolting in the bed.   We had already given 1mg  of IV ativan. He asked for pain relief and we then gave him 1 mg of IV dilaudid. After roughly 5-7 minutes he was sedated and in fact fell asleep. Vitals monitored throughout.  I was able to numb the subcutaneous tissue and deep tissue with 1% licocaine  I used the spinal needle and made two passes in the L4-L5 space without success. With insertion more inferiorly I was able to enter L4-5 space with clear CSF returning  OPENING PRESSURE: I watched pressure go to 41 cm H20 where it appeared to stabilize but then when I could not longer see meniscus CSF came out of the top of the tube  Therefore OP > 55 cm H20  4 tubes with approximately 38 ml of CSF were collected  Closing pressure = 18 cm H20  Needle withdrawn.  Instructions to RN for the patient to lie in bed for 5 hours  Specimens: 4 tubes of CSF taken to main lab by me personally and sent for studies  EBL: minimal  Complications: none

## 2016-12-25 NOTE — Progress Notes (Signed)
Pt refusing to wear telemetry. States"I'm taking this shit off". Explained reason for telemetry . Pt still refusing . MD notified. Will monitor closely and attempt to get pt to comply. Susie Cassette RN

## 2016-12-25 NOTE — Progress Notes (Signed)
Consult  PROGRESS NOTE                                                                                                                                                                                                             Patient Demographics:    William Pena, is a 31 y.o. male, DOB - [birth year], BPZ:[id number]  Admit date - 12/20/2016   Admitting Physician Md Edison Pace, MD  Outpatient Primary MD for the patient is [name], [name], MD  LOS - 4   Chief Complaint  Patient presents with  . Rectal Bleeding  . 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  [facility] hospitalist took over primary management on 5/24 given significant comorbidities.   Subjective:    William Pena today Reports he is feeling better,Complaints of pain at the abscess site, without overnight, but low-grade temperature this afternoon, denies cough, chest pain or shortness of breath .   Assessment  & Plan :    Principal Problem:   Perirectal abscess Active Problems:   Chronic pain   AIDS (acquired immune deficiency syndrome) (HCC)   Hyponatremia   Normocytic anemia   Elevated LFTs   Pulmonary infiltrates   Hypomagnesemia   Encephalopathy acute   SIRS (systemic inflammatory response syndrome) (HCC)   Acute respiratory failure (HCC)   Rectal abscess   Cryptococcal meningoencephalitis (HCC)   Cryptococcosis (HCC)   Cavitary pneumonia   Diffuse lymphadenopathy   Drug  rash   Perirectal abscess - Status post I&D 5/23 by Dr. Rosendo Gros, wound care per surgical team, initially on vancomycin and cefepime, changed to Augmentin and doxycycline, Augmentin stopped secondary to rash, currently on Rocephin and Bactrim which will provide PCP prophylaxis as well.  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 - Reports headache to ED physician today, plan for LP to relieve pressure, thankfully ID able to assist with that .   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 sodium is 136 today   Anemia: - Normocyticontinue to treat down gradually, this is most likely due to multiple lab draws and anemia of chronic illness, hemoglobin 7.5 today, but he likely will need transfusion in 1-2 days   Elevated LFTs: - Mild.Trend  Cavitary Pneumonia - CT chest with right upper small cavitary lesions, suspicion for mTB vs. MAC vs Crypto lung involvement - Bronchoscopy canceled on 5/25 secondary to frequent respiratory status, appears to be better, he will need bronchoscopy.  Acute encephalopathy - Secondary to narcotics, resolved .  Rash - Secondary to  Augmentin.  Lymphadenopathy - Lymph node biopsy by IR.  Thrombocytopenia - Platelets dropped to 64 today, he is on flucytosine, will monitor closely, appears to be improving, platelet count is 79  Hypophosphatemia  - repleted.   Code Status : Full  Family Communication  : D/W patient  Consults : Surgery, ID, IR, PCCM  Procedures  : LP 2/23  DVT Prophylaxis  : SCDs, will resume on lovenox after LN biopsy.  Lab Results  Component Value Date   PLT 79 (L) 12/25/2016    Antibiotics  :    Anti-infectives    Start     Dose/Rate Route Frequency Ordered Stop   12/23/16 2200  sulfamethoxazole-trimethoprim (BACTRIM DS,SEPTRA DS) 800-160 MG per tablet 1 tablet     1 tablet Oral Every 12 hours 12/23/16 1822     12/23/16 1900  cefTRIAXone (ROCEPHIN) 2 g in dextrose 5 % 50 mL IVPB     2 g 100 mL/hr over 30 Minutes Intravenous Every 24 hours 12/23/16 1821     12/23/16 0900  amphotericin B liposome (AMBISOME) 200 mg in dextrose 5 % 500 mL IVPB     200 mg 250 mL/hr over 120 Minutes Intravenous Every 24 hours 12/22/16 0919     12/23/16 0800  azithromycin (ZITHROMAX) tablet 1,200 mg     1,200 mg Oral Weekly 12/22/16 0811     12/22/16 2200  valACYclovir (VALTREX) tablet 1,000 mg     1,000 mg Oral 2 times daily 12/22/16 1611 12/29/16 2159   12/22/16 1800  amoxicillin-clavulanate (AUGMENTIN) 875-125 MG per tablet 1 tablet  Status:  Discontinued     1 tablet Oral 2 times daily with meals 12/22/16 1446 12/23/16 1821   12/22/16 1800  doxycycline (VIBRA-TABS) tablet 100 mg  Status:  Discontinued     100 mg Oral 2 times daily with meals 12/22/16 1446 12/23/16 1821   12/22/16 1200  flucytosine (ANCOBON) capsule 1,500 mg     1,500 mg Oral Every 6 hours 12/22/16 0614     12/22/16 1000  azithromycin (ZITHROMAX) tablet 250 mg  Status:  Discontinued  250 mg Oral Daily 12/21/16 0224 12/21/16 1131   12/22/16 1000  doxycycline (VIBRA-TABS) tablet 100 mg  Status:  Discontinued     100 mg Oral Every  12 hours 12/22/16 0942 12/22/16 1446   12/22/16 0000  amphotericin B liposome (AMBISOME) 190 mg in dextrose 5 % 500 mL IVPB  Status:  Discontinued     3 mg/kg  64.2 kg 250 mL/hr over 120 Minutes Intravenous Every 24 hours 12/21/16 2313 12/22/16 0919   12/22/16 0000  flucytosine (ANCOBON) capsule 250 mg  Status:  Discontinued     250 mg Oral Every 6 hours 12/21/16 2314 12/21/16 2325   12/22/16 0000  flucytosine (ANCOBON) capsule 1,500 mg  Status:  Discontinued     1,500 mg Oral Every 6 hours 12/21/16 2326 12/22/16 0614   12/21/16 1645  cefTRIAXone (ROCEPHIN) 2 g in dextrose 5 % 50 mL IVPB  Status:  Discontinued     2 g 100 mL/hr over 30 Minutes Intravenous Every 24 hours 12/21/16 1539 12/22/16 1446   12/21/16 1645  metroNIDAZOLE (FLAGYL) IVPB 500 mg  Status:  Discontinued     500 mg 100 mL/hr over 60 Minutes Intravenous Every 8 hours 12/21/16 1539 12/21/16 2348   12/21/16 1600  cefTRIAXone (ROCEPHIN) 2 g in dextrose 5 % 50 mL IVPB  Status:  Discontinued     2 g 100 mL/hr over 30 Minutes Intravenous Every 24 hours 12/21/16 1131 12/21/16 1558   12/21/16 1400  vancomycin (VANCOCIN) IVPB 750 mg/150 ml premix  Status:  Discontinued     750 mg 150 mL/hr over 60 Minutes Intravenous Every 8 hours 12/21/16 0142 12/22/16 0942   12/21/16 1000  sulfamethoxazole-trimethoprim (BACTRIM DS,SEPTRA DS) 800-160 MG per tablet 1 tablet  Status:  Discontinued     1 tablet Oral Daily 12/21/16 0058 12/21/16 0100   12/21/16 1000  abacavir-dolutegravir-lamiVUDine (TRIUMEQ) 600-50-300 MG per tablet 1 tablet  Status:  Discontinued     1 tablet Oral Daily 12/21/16 0204 12/22/16 0903   12/21/16 1000  sulfamethoxazole-trimethoprim (BACTRIM DS,SEPTRA DS) 800-160 MG per tablet 1 tablet  Status:  Discontinued     1 tablet Oral Daily 12/21/16 0204 12/23/16 1822   12/21/16 0800  ceFEPIme (MAXIPIME) 2 g in dextrose 5 % 50 mL IVPB  Status:  Discontinued     2 g 100 mL/hr over 30 Minutes Intravenous Every 8 hours 12/21/16 0137  12/21/16 1131   12/21/16 0600  piperacillin-tazobactam (ZOSYN) IVPB 3.375 g  Status:  Discontinued     3.375 g 12.5 mL/hr over 240 Minutes Intravenous Every 8 hours 12/21/16 0058 12/21/16 0132   12/21/16 0200  metroNIDAZOLE (FLAGYL) IVPB 500 mg  Status:  Discontinued     500 mg 100 mL/hr over 60 Minutes Intravenous Every 8 hours 12/21/16 0137 12/22/16 1446   12/21/16 0145  ceFEPIme (MAXIPIME) 2 g in dextrose 5 % 50 mL IVPB     2 g 100 mL/hr over 30 Minutes Intravenous  Once 12/21/16 0137 12/21/16 0358   12/21/16 0145  vancomycin (VANCOCIN) IVPB 1000 mg/200 mL premix     1,000 mg 200 mL/hr over 60 Minutes Intravenous  Once 12/21/16 0142 12/21/16 0429   12/21/16 0130  piperacillin-tazobactam (ZOSYN) IVPB 3.375 g  Status:  Discontinued     3.375 g 100 mL/hr over 30 Minutes Intravenous  Once 12/21/16 0115 12/21/16 0132   12/21/16 0130  azithromycin (ZITHROMAX) powder 1 g     1 g Oral  Once 12/21/16 0125 12/21/16 0328  Objective:   Vitals:   12/25/16 0500 12/25/16 0600 12/25/16 0820 12/25/16 1134  BP:   115/68 129/86  Pulse:  (!) 30    Resp: 19 16 17 20   Temp:   99 F (37.2 C) (!) 100.7 F (38.2 C)  TempSrc:   Oral Oral  SpO2:  96%    Weight:      Height:        Wt Readings from Last 3 Encounters:  12/21/16 64.2 kg (141 lb 8 oz)  12/20/16 65.8 kg (145 lb)  12/17/16 65.8 kg (145 lb)     Intake/Output Summary (Last 24 hours) at 12/25/16 1328 Last data filed at 12/25/16 0735  Gross per 24 hour  Intake             3025 ml  Output             1325 ml  Net             1700 ml     Physical Exam  Awake alert oriented 3, no apparent distress , rash and face is improving  Oral thrush is improving, patient with cervical lymphadenopathy  Good air entry bilaterally, no wheezing or rhonchi S1, S2 heard , no rubs, gallops, murmur Abdomen soft, nontender nondistended, bowel sounds present no edema, clubbing or cyanosis     Data Review:    CBC  Recent Labs Lab  12/20/16 0647  12/21/16 0825 12/22/16 0658 12/23/16 0507 12/24/16 0249 12/25/16 0345  WBC 6.9  < > 6.9 9.3 6.2 8.2 8.7  HGB 9.9*  < > 8.9* 8.3* 8.3* 7.8* 7.5*  HCT 31.1*  < > 28.8* 26.6* 26.6* 24.8* 24.1*  PLT 131*  < > 111* 95* 128* 64* 79*  MCV 83.4  < > 83.7 82.9 82.9 83.2 82.3  MCH 26.5  < > 25.9* 25.9* 25.9* 26.2 25.6*  MCHC 31.8  < > 30.9 31.2 31.2 31.5 31.1  RDW 14.8  < > 14.9 14.7 15.0 15.1 15.3  LYMPHSABS 0.6*  --   --   --   --   --   --   MONOABS 0.2  --   --   --   --   --   --   EOSABS 0.1  --   --   --   --   --   --   BASOSABS 0.1  --   --   --   --   --   --   < > = values in this interval not displayed.  Chemistries   Recent Labs Lab 12/20/16 0647 12/20/16 1947 12/21/16 0825 12/22/16 0658 12/23/16 0507 12/24/16 0249 12/25/16 0345  NA 128* 127* 126* 125* 129* 133* 136  K 3.8 5.4* 3.8 3.4* 4.7 3.3* 4.1  CL 94* 98* 98* 95* 101 109 112*  CO2 25 24 24 24  21* 19* 18*  GLUCOSE 93 89 85 107* 73 104* 84  BUN 16 13 9 7 10 10 6   CREATININE 0.93 1.02 0.91 0.79 1.12 1.05 0.82  CALCIUM 8.2* 7.5* 7.8* 7.3* 7.5* 7.5* 7.9*  MG  --   --   --   --  1.3* 2.3 1.7  AST 62* 139*  --  154*  --   --   --   ALT 38 62  --  89*  --   --   --   ALKPHOS 75 76  --  82  --   --   --  BILITOT 0.6 1.3*  --  0.8  --   --   --    ------------------------------------------------------------------------------------------------------------------ No results for input(s): CHOL, HDL, LDLCALC, TRIG, CHOLHDL, LDLDIRECT in the last 72 hours.  No results found for: HGBA1C ------------------------------------------------------------------------------------------------------------------ No results for input(s): TSH, T4TOTAL, T3FREE, THYROIDAB in the last 72 hours.  Invalid input(s): FREET3 ------------------------------------------------------------------------------------------------------------------ No results for input(s): VITAMINB12, FOLATE, FERRITIN, TIBC, IRON, RETICCTPCT in the last  72 hours.  Coagulation profile  Recent Labs Lab 12/22/16 1125  INR 1.20    No results for input(s): DDIMER in the last 72 hours.  Cardiac Enzymes No results for input(s): CKMB, TROPONINI, MYOGLOBIN in the last 168 hours.  Invalid input(s): CK ------------------------------------------------------------------------------------------------------------------ No results found for: BNP  Inpatient Medications  Scheduled Meds: . azithromycin  1,200 mg Oral Weekly  . flucytosine  1,500 mg Oral Q6H  . lidocaine (PF)  5 mL Other Once  . LORazepam  1 mg Intravenous Once  . magic mouthwash  5 mL Oral QID  . sulfamethoxazole-trimethoprim  1 tablet Oral Q12H  . valACYclovir  1,000 mg Oral BID   Continuous Infusions: . sodium chloride 100 mL/hr at 12/23/16 0449  . amphotericin  B  Liposome (AMBISOME) ADULT IV Stopped (12/25/16 1246)  . cefTRIAXone (ROCEPHIN)  IV Stopped (12/24/16 1916)  . sodium chloride Stopped (12/25/16 0835)  . sodium chloride 500 mL (12/25/16 1230)  . sodium phosphate  Dextrose 5% IVPB 20 mmol (12/25/16 0847)   PRN Meds:.acetaminophen **OR** acetaminophen, diphenhydrAMINE **OR** diphenhydrAMINE, HYDROmorphone (DILAUDID) injection, [COMPLETED] ketorolac **FOLLOWED BY** ketorolac, ondansetron **OR** ondansetron (ZOFRAN) IV, oxyCODONE, polyethylene glycol, simethicone  Micro Results Recent Results (from the past 240 hour(s))  Culture, blood (Routine X 2) w Reflex to ID Panel     Status: Abnormal (Preliminary result)   Collection Time: 12/20/16  6:42 AM  Result Value Ref Range Status   Specimen Description BLOOD LEFT ANTECUBITAL  Final   Special Requests   Final    BOTTLES DRAWN AEROBIC AND ANAEROBIC Blood Culture adequate volume   Culture  Setup Time   Final    AEROBIC BOTTLE ONLY YEAST CRITICAL VALUE NOTED.  VALUE IS CONSISTENT WITH PREVIOUSLY REPORTED AND CALLED VALUE. Performed at [facility], [place] [address], [place], [place] [address]    Culture  CRYPTOCOCCUS NEOFORMANS (A)  Final   Report Status PENDING  Incomplete  Culture, blood (Routine X 2) w Reflex to ID Panel     Status: Abnormal   Collection Time: 12/20/16  6:54 AM  Result Value Ref Range Status   Specimen Description BLOOD RIGHT ANTECUBITAL  Final   Special Requests IN PEDIATRIC BOTTLE Blood Culture adequate volume  Final   Culture  Setup Time   Final    IN PEDIATRIC BOTTLE YEAST CRITICAL RESULT CALLED TO, READ BACK BY AND VERIFIED WITHJerel Shepherd PHARMD, AT 9357 12/23/16 BY Rush Landmark Performed at [facility], [place] [address] [place], [place] [address]    Culture CRYPTOCOCCUS NEOFORMANS (A)  Final   Report Status 12/25/2016 FINAL  Final  Blood Culture ID Panel (Reflexed)     Status: None   Collection Time: 12/20/16  6:54 AM  Result Value Ref Range Status   Enterococcus species NOT DETECTED NOT DETECTED Final   Listeria monocytogenes NOT DETECTED NOT DETECTED Final   Staphylococcus species NOT DETECTED NOT DETECTED Final   Staphylococcus aureus NOT DETECTED NOT DETECTED Final   Streptococcus species NOT DETECTED NOT DETECTED Final   Streptococcus agalactiae NOT DETECTED  NOT DETECTED Final   Streptococcus pneumoniae NOT DETECTED NOT DETECTED Final   Streptococcus pyogenes NOT DETECTED NOT DETECTED Final   Acinetobacter baumannii NOT DETECTED NOT DETECTED Final   Enterobacteriaceae species NOT DETECTED NOT DETECTED Final   Enterobacter cloacae complex NOT DETECTED NOT DETECTED Final   Escherichia coli NOT DETECTED NOT DETECTED Final   Klebsiella oxytoca NOT DETECTED NOT DETECTED Final   Klebsiella pneumoniae NOT DETECTED NOT DETECTED Final   Proteus species NOT DETECTED NOT DETECTED Final   Serratia marcescens NOT DETECTED NOT DETECTED Final   Haemophilus influenzae NOT DETECTED NOT DETECTED Final   Neisseria meningitidis NOT DETECTED NOT DETECTED Final   Pseudomonas aeruginosa NOT DETECTED NOT DETECTED Final   Candida albicans NOT DETECTED NOT DETECTED Final    Candida glabrata NOT DETECTED NOT DETECTED Final   Candida krusei NOT DETECTED NOT DETECTED Final   Candida parapsilosis NOT DETECTED NOT DETECTED Final   Candida tropicalis NOT DETECTED NOT DETECTED Final    Comment: Performed at [facility], [place] [address] [place], [place] [address]  Surgical pcr screen     Status: None   Collection Time: 12/21/16  2:25 AM  Result Value Ref Range Status   MRSA, PCR NEGATIVE NEGATIVE Final   Staphylococcus aureus NEGATIVE NEGATIVE Final    Comment:        The Xpert SA Assay (FDA approved for NASAL specimens in patients over 25 years of age), is one component of a comprehensive surveillance program.  Test performance has been validated by [facility] for patients greater than or equal to 63 year old. It is not intended to diagnose infection nor to guide or monitor treatment.   Blood culture (routine x 2)     Status: None (Preliminary result)   Collection Time: 12/21/16  4:11 AM  Result Value Ref Range Status   Specimen Description BLOOD LEFT ANTECUBITAL  Final   Special Requests   Final    BOTTLES DRAWN AEROBIC ONLY Blood Culture adequate volume   Culture  Setup Time   Final    YEAST AEROBIC BOTTLE ONLY CRITICAL VALUE NOTED.  VALUE IS CONSISTENT WITH PREVIOUSLY REPORTED AND CALLED VALUE.    Culture YEAST CULTURE REINCUBATED FOR BETTER GROWTH   Final   Report Status PENDING  Incomplete  Blood culture (routine x 2)     Status: Abnormal (Preliminary result)   Collection Time: 12/21/16  4:11 AM  Result Value Ref Range Status   Specimen Description BLOOD RIGHT HAND  Final   Special Requests IN PEDIATRIC BOTTLE Blood Culture adequate volume  Final   Culture  Setup Time   Final    IN PEDIATRIC BOTTLE YEAST CRITICAL RESULT CALLED TO, READ BACK BY AND VERIFIED WITH: G.HIBBETT PHARMD, 7425 12/24/16 M.CAMPBELL    Culture CRYPTOCOCCUS NEOFORMANS (A)  Final   Report Status PENDING  Incomplete  Blood Culture ID Panel (Reflexed)      Status: None   Collection Time: 12/21/16  4:11 AM  Result Value Ref Range Status   Enterococcus species NOT DETECTED NOT DETECTED Final   Listeria monocytogenes NOT DETECTED NOT DETECTED Final   Staphylococcus species NOT DETECTED NOT DETECTED Final   Staphylococcus aureus NOT DETECTED NOT DETECTED Final   Streptococcus species NOT DETECTED NOT DETECTED Final   Streptococcus agalactiae NOT DETECTED NOT DETECTED Final   Streptococcus pneumoniae NOT DETECTED NOT DETECTED Final   Streptococcus pyogenes NOT DETECTED NOT DETECTED Final   Acinetobacter baumannii NOT DETECTED NOT DETECTED Final  Enterobacteriaceae species NOT DETECTED NOT DETECTED Final   Enterobacter cloacae complex NOT DETECTED NOT DETECTED Final   Escherichia coli NOT DETECTED NOT DETECTED Final   Klebsiella oxytoca NOT DETECTED NOT DETECTED Final   Klebsiella pneumoniae NOT DETECTED NOT DETECTED Final   Proteus species NOT DETECTED NOT DETECTED Final   Serratia marcescens NOT DETECTED NOT DETECTED Final   Haemophilus influenzae NOT DETECTED NOT DETECTED Final   Neisseria meningitidis NOT DETECTED NOT DETECTED Final   Pseudomonas aeruginosa NOT DETECTED NOT DETECTED Final   Candida albicans NOT DETECTED NOT DETECTED Final   Candida glabrata NOT DETECTED NOT DETECTED Final   Candida krusei NOT DETECTED NOT DETECTED Final   Candida parapsilosis NOT DETECTED NOT DETECTED Final   Candida tropicalis NOT DETECTED NOT DETECTED Final  Aerobic/Anaerobic Culture (surgical/deep wound)     Status: Abnormal (Preliminary result)   Collection Time: 12/21/16  1:39 PM  Result Value Ref Range Status   Specimen Description ABSCESS  Final   Special Requests   Final    PERIANAL PATIENT ON FOLLOWING  SEPTRA DS AND ZITHROMAX   Gram Stain   Final    FEW WBC PRESENT,BOTH PMN AND MONONUCLEAR FEW GRAM VARIABLE ROD    Culture (A)  Final    MULTIPLE ORGANISMS PRESENT, NONE PREDOMINANT HOLDING FOR POSSIBLE ANAEROBE    Report Status PENDING   Incomplete  CSF culture with Stat gram stain     Status: None   Collection Time: 12/22/16  9:29 AM  Result Value Ref Range Status   Specimen Description BACK  Final   Special Requests Immunocompromised  Final   Gram Stain   Final    CYTOSPIN SMEAR WBC PRESENT, PREDOMINANTLY MONONUCLEAR YEAST CRITICAL RESULT CALLED TO, READ BACK BY AND VERIFIED WITH: RN Morton Peters [id number] MLM    Culture ABUNDANT CRYPTOCOCCUS NEOFORMANS  Final   Report Status 12/25/2016 FINAL  Final  Culture, fungus without smear     Status: Abnormal (Preliminary result)   Collection Time: 12/22/16  9:51 AM  Result Value Ref Range Status   Specimen Description CSF  Final   Special Requests Immunocompromised  Final   Culture CRYPTOCOCCUS NEOFORMANS (A)  Final   Report Status PENDING  Incomplete    Radiology Reports Dg Chest 2 View  Result Date: 12/20/2016 CLINICAL DATA:  Abdominal pain with nausea and vomiting and shortness of breath EXAM: CHEST  2 VIEW COMPARISON:  None. FINDINGS: The heart size and mediastinal contours are within normal limits. Both lungs are clear. The visualized skeletal structures are unremarkable. IMPRESSION: No active cardiopulmonary disease. Electronically Signed   By: Inez Catalina M.D.   On: 12/20/2016 07:48   Ct Head Wo Contrast  Result Date: 12/23/2016 CLINICAL DATA:  Mental status changes EXAM: CT HEAD WITHOUT CONTRAST TECHNIQUE: Contiguous axial images were obtained from the base of the skull through the vertex without intravenous contrast. COMPARISON:  None. FINDINGS: Brain: No evidence of acute infarction, hemorrhage, hydrocephalus, extra-axial collection or mass lesion/mass effect. Vascular: No hyperdense vessel or unexpected calcification. Skull: Normal. Negative for fracture or focal lesion. Sinuses/Orbits: No acute finding. Other: None. IMPRESSION: Normal brain. Electronically Signed   By: Kerby Moors M.D.   On: 12/23/2016 12:11   Ct Head Wo Contrast  Result Date:  12/21/2016 CLINICAL DATA:  HIV. Concern for disseminated infection or meningitis. Lymphadenopathy. Nuchal rigidity. EXAM: CT HEAD WITHOUT CONTRAST TECHNIQUE: Contiguous axial images were obtained from the base of the skull through the vertex without intravenous contrast. COMPARISON:  Head CT 12/17/2016 FINDINGS: Brain: No mass lesion, intraparenchymal hemorrhage or extra-axial collection. No evidence of acute cortical infarct. Brain parenchyma and CSF-containing spaces are normal for age. Vascular: No hyperdense vessel or unexpected calcification. Skull: Normal visualized skull base, calvarium and extracranial soft tissues. Sinuses/Orbits: No sinus fluid levels or advanced mucosal thickening. No mastoid effusion. Normal orbits. IMPRESSION: Normal head CT. Electronically Signed   By: Ulyses Jarred M.D.   On: 12/21/2016 19:06   Ct Head Wo Contrast  Result Date: 12/17/2016 CLINICAL DATA:  Headache EXAM: CT HEAD WITHOUT CONTRAST TECHNIQUE: Contiguous axial images were obtained from the base of the skull through the vertex without intravenous contrast. COMPARISON:  None. FINDINGS: Brain: No evidence of acute infarction, hemorrhage, hydrocephalus, extra-axial collection or mass lesion/mass effect. Vascular: No hyperdense vessel or unexpected calcification. Skull: Normal. Negative for fracture or focal lesion. Sinuses/Orbits: No acute finding. Other: None IMPRESSION: No acute intracranial abnormality. Electronically Signed   By: Ashley Royalty M.D.   On: 12/17/2016 21:58   Ct Chest Wo Contrast  Result Date: 12/21/2016 CLINICAL DATA:  Evaluate for lymph node enlargement. 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 31 year old with HIV. EXAM: CT CHEST WITHOUT CONTRAST TECHNIQUE: Multidetector CT imaging of the chest was performed following the standard protocol without IV contrast. COMPARISON:  Chest radiograph 12/20/2016 FINDINGS: Cardiovascular: Normal caliber of the thoracic aorta. No gross abnormality to the pulmonary arteries.  Limited evaluation of the vascular structures without intravenous contrast. Mediastinum/Nodes: Limited evaluation for mediastinal and hilar lymphadenopathy on this noncontrast examination. There appears to be subcarinal fullness measuring up to 1.8 cm which probably represents enlarged lymph nodes. Soft tissue fullness in the right hilum on sequence 4, image 75 measures 1.6 cm in the short axis. Multiple small lymph nodes scattered throughout the bilateral axillary regions. There appears to be multiple lymph nodes in the paratracheal region. There is low density in the anterior mediastinum which is nonspecific but could be associated with thymus. Cannot exclude right supraclavicular lymphadenopathy. Lungs/Pleura: There is trace right pleural fluid versus right pleural thickening. Trachea and mainstem bronchi are patent. Patchy areas of airspace disease in both lower lobes and both upper lobes. Small cavitary lesion in the right upper lobe measuring 6 mm on sequence 6, image 47. Scattered areas of interstitial thickening in the right lung. Upper Abdomen: Images of the upper abdomen are unremarkable. Musculoskeletal: No acute bone abnormality. IMPRESSION: Bilateral airspace disease with a small cavitary lesion in the right upper lobe. Findings compatible with pneumonia. Trace right pleural fluid versus right pleural thickening. Mediastinal and hilar lymphadenopathy. Evaluation of the lymphadenopathy is limited on this noncontrast examination. There are also prominent bilateral axillary lymph nodes. Electronically Signed   By: Markus Daft M.D.   On: 12/21/2016 21:15   Ct Abdomen Pelvis W Contrast  Result Date: 12/21/2016 CLINICAL DATA:  Known rectal abscesses, status post incision and drainage. Personal history of HIV. Initial encounter. EXAM: CT ABDOMEN AND PELVIS WITH CONTRAST TECHNIQUE: Multidetector CT imaging of the abdomen and pelvis was performed using the standard protocol following bolus administration of  intravenous contrast. CONTRAST:  143m ISOVUE-300 IOPAMIDOL (ISOVUE-300) INJECTION 61% COMPARISON:  CT of the abdomen and pelvis performed 12/17/2016 FINDINGS: Lower chest: Mild right basilar airspace opacity raises concern for pneumonia. A 1.6 cm azygoesophageal recess node is seen. Hepatobiliary: The liver is unremarkable in appearance. A stone is noted dependently within the gallbladder. The gallbladder is otherwise unremarkable. The common bile duct remains normal in caliber. Pancreas: The pancreas is within normal limits.  Spleen: The spleen is unremarkable in appearance. Adrenals/Urinary Tract: The adrenal glands are unremarkable in appearance. The kidneys are within normal limits. There is no evidence of hydronephrosis. No renal or ureteral stones are identified. No perinephric stranding is seen. Stomach/Bowel: The stomach is unremarkable in appearance. The small bowel is within normal limits. The patient is status post appendectomy. Mild wall thickening is noted along the distal sigmoid colon and rectum, raising concern for proctitis. The remainder of the colon is filled with contrast and is unremarkable in appearance. Vascular/Lymphatic: The abdominal aorta is unremarkable in appearance. The inferior vena cava is grossly unremarkable. Prominent retroperitoneal nodes are seen, measuring up to 1.1 cm in short axis. Prominent nodes are also noted at both sides of the pelvis, measuring up to 1.7 cm in short axis. Enlarged bilateral inguinal nodes are seen, measuring up to 1.6 cm in short axis. Reproductive: The bladder is mildly distended and contains trace contrast. The prostate remains normal in size. Other: Trace ascites is noted about the right side of the abdomen. Diffuse presacral soft tissue inflammation is again noted. There is diffuse soft tissue edema tracking about the anorectal canal, without definite evidence of abscess. Edema is seen tracking along the right side of the gluteal cleft, similar in  appearance to the prior study. This may reflect a persistent subcutaneous abscess, given stability from the prior study, though not well assessed on delayed images. A small drainage catheter is noted posterior to the anorectal canal. Scrotal wall edema is noted, worse on the right, with small bilateral hydroceles. Musculoskeletal: No acute osseous abnormalities are identified. The visualized musculature is unremarkable in appearance. IMPRESSION: 1. Edema tracking along the right side of the gluteal cleft is similar in appearance to the prior study. This reflected a subcutaneous abscess on the prior study. This most likely reflects a persistent small subcutaneous abscess. 2. Diffuse soft tissue edema tracking about the anorectal canal, without additional abscess. Diffuse presacral soft tissue inflammation noted. 3. Scrotal wall edema, worse on the right, with small bilateral hydroceles. 4. Mild right basilar airspace opacity raises concern for pneumonia. 5. Mild wall thickening along the distal sigmoid colon and rectum, raising concern for proctitis. 6. Enlarged azygoesophageal recess, retroperitoneal, pelvic sidewall and bilateral inguinal nodes seen, measuring up to 1.7 cm in short axis. This likely reflects the underlying infection. 7. Cholelithiasis.  Gallbladder otherwise unremarkable. Electronically Signed   By: Garald Balding M.D.   On: 12/21/2016 01:29   Ct Abdomen Pelvis W Contrast  Result Date: 12/17/2016 CLINICAL DATA:  Known rectal abscesses. Frequent vomiting past 4 days. Hypotension earlier today. HIV positive. EXAM: CT ABDOMEN AND PELVIS WITH CONTRAST TECHNIQUE: Multidetector CT imaging of the abdomen and pelvis was performed using the standard protocol following bolus administration of intravenous contrast. CONTRAST:  <See Chart> ISOVUE-300 IOPAMIDOL (ISOVUE-300) INJECTION 61% COMPARISON:  10/31/2016 FINDINGS: Lower chest: Lung bases are within normal. Hepatobiliary: Single punctate gallstone.  Liver and biliary tree are within normal. Pancreas: Within normal. Spleen: Within normal. Adrenals/Urinary Tract: Adrenal glands are normal. Kidneys are normal in size without hydronephrosis or nephrolithiasis. Ureters and bladder are normal. Stomach/Bowel: Stomach and small bowel are within normal. Appendix is within normal. Colon is unremarkable. Vascular/Lymphatic: Vascular structures are within normal. Mildly prominent inguinal lymph nodes unchanged. Reproductive: Unremarkable. Other: Slight interval decrease in size of a medial right gluteal subcutaneous abscess measuring 1 x 3.3 cm (previously 1.5 x 3.8 cm). Small caliber drain projects very superficial lesion over the lower gluteal crease just above  this subcutaneous abscess. Mild wall thickening of the rectum with small round enhancing right perirectal abscess measuring 0.8 x 1.6 cm. Subtle perirectal inflammation. Few small perirectal lymph nodes are present. Slight worsening external iliac chain adenopathy with the largest node over the right iliac chain measuring 1.6 x 3.3 cm. Musculoskeletal: Within normal. IMPRESSION: Persistent rectal wall thickening and perirectal inflammation with new small right perirectal abscess measuring 0.8 x 1.6 cm. Interval decrease in size of a medial right gluteal subcutaneous abscess measuring 1 x 3.3 cm (previously 1.5 x 3.8 cm). Bilateral inguinal and iliac chain/ pelvic adenopathy slightly worse. Findings likely related to patient's ongoing infection described above as well as known HIV. Minimal cholelithiasis unchanged. These results were called by telephone at the time of interpretation on 12/17/2016 at 10:13 pm to Dr. Quintella Reichert , who verbally acknowledged these results. Electronically Signed   By: Marin Olp M.D.   On: 12/17/2016 22:14   Dg Chest Port 1 View  Result Date: 12/23/2016 CLINICAL DATA:  Generalized body aches 1 week. Chest pain. Short of breath. HIV positive. EXAM: PORTABLE CHEST 1 VIEW  COMPARISON:  12/20/2016 FINDINGS: The heart is borderline enlarged. Central and basilar hazy opacities of developed. No pneumothorax. There is new prominence of the hilar regions. IMPRESSION: New hilar prominence and bilateral hazy airspace opacities compared with 3 days ago. Differential diagnosis includes new pulmonary edema with increased size of the hilar lymph nodes or an inflammatory process. Opportunistic infection should be considered. Electronically Signed   By: Marybelle Killings M.D.   On: 12/23/2016 09:44     Essance Gatti M.D on 12/25/2016 at 1:28 PM  Between 7am to 7pm - Pager - [phone]  After 7pm go to [email or web address] - password [facility]  [facility] -  Office  [phone]

## 2016-12-25 NOTE — Progress Notes (Signed)
Patient's bed alarm now on; pt. Took leads off and walked to bathroom urinating on the floor. Patient is acting strange and out of character at this time. Will continue to monitor.

## 2016-12-25 NOTE — Progress Notes (Addendum)
CRITICAL VALUE ALERT  Critical Value:  SCF WBC 29  Date & Time Notied:  12/25/16 @ 1914  Provider Notified: Dr. Olevia Bowens  Orders Received/Actions taken: Called back patient already on medications for it per MD

## 2016-12-25 NOTE — Plan of Care (Signed)
Problem: Pain Managment: Goal: General experience of comfort will improve Outcome: Progressing Discussed with patient plan of care tonight with some teach back displayed

## 2016-12-25 NOTE — ED Provider Notes (Signed)
Medical screening examination/treatment/procedure(s) were conducted as a shared visit with non-physician practitioner(s) and myself.  I personally evaluated the patient during the encounter.   EKG Interpretation  Date/Time:  Tuesday Dec 20 2016 18:21:04 EDT Ventricular Rate:  80 PR Interval:    QRS Duration: 92 QT Interval:  348 QTC Calculation: 402 R Axis:   89 Text Interpretation:  Sinus rhythm Borderline short PR interval RSR' in V1 or V2, right VCD or RVH No acute changes No old tracing to compare Confirmed by Varney Biles [phone]) on 12/20/2016 6:53:22 PM       I performed a history and physical examination of  William Pena and discussed his management with the resident physician. I agree with the history, physical, assessment, and plan of care, with the following exceptions: None I was present for the following procedures: None  Time Spent in Critical Care of the patient:   Time spent in discussions with the patient and family: 10 min  William Pena   Pt comes in with cc of fevers, chills. He also c/o weakness, dib and dizziness.. He has perirectal abscess. Pt has HIV. Seen earlier at [place]. Pt being treated for perirectal abscess with oral antbiotics. Since pt is deteriorating, we will get CT and consult surgery. Pt had a neg CXR earlier today.    Varney Biles, MD 12/25/16 [phone]

## 2016-12-26 ENCOUNTER — Telehealth: Payer: Self-pay | Admitting: Emergency Medicine

## 2016-12-26 ENCOUNTER — Inpatient Hospital Stay (HOSPITAL_COMMUNITY): Payer: Medicaid Other

## 2016-12-26 LAB — CBC
HCT: 22.3 % — ABNORMAL LOW (ref 39.0–52.0)
Hemoglobin: 6.9 g/dL — CL (ref 13.0–17.0)
MCH: 25.3 pg — AB (ref 26.0–34.0)
MCHC: 30.9 g/dL (ref 30.0–36.0)
MCV: 81.7 fL (ref 78.0–100.0)
Platelets: 108 10*3/uL — ABNORMAL LOW (ref 150–400)
RBC: 2.73 MIL/uL — AB (ref 4.22–5.81)
RDW: 15.4 % (ref 11.5–15.5)
WBC: 6.9 10*3/uL (ref 4.0–10.5)

## 2016-12-26 LAB — CULTURE, BLOOD (ROUTINE X 2)
SPECIAL REQUESTS: ADEQUATE
SPECIAL REQUESTS: ADEQUATE
Special Requests: ADEQUATE

## 2016-12-26 LAB — BASIC METABOLIC PANEL
ANION GAP: 5 (ref 5–15)
BUN: <5 mg/dL — ABNORMAL LOW (ref 6–20)
CO2: 19 mmol/L — AB (ref 22–32)
CREATININE: 0.88 mg/dL (ref 0.61–1.24)
Calcium: 8 mg/dL — ABNORMAL LOW (ref 8.9–10.3)
Chloride: 112 mmol/L — ABNORMAL HIGH (ref 101–111)
Glucose, Bld: 86 mg/dL (ref 65–99)
Potassium: 3.3 mmol/L — ABNORMAL LOW (ref 3.5–5.1)
Sodium: 136 mmol/L (ref 135–145)

## 2016-12-26 LAB — POCT I-STAT 3, ART BLOOD GAS (G3+)
Acid-base deficit: 1 mmol/L (ref 0.0–2.0)
Bicarbonate: 22.6 mmol/L (ref 20.0–28.0)
O2 Saturation: 100 %
PCO2 ART: 31.8 mmHg — AB (ref 32.0–48.0)
PH ART: 7.46 — AB (ref 7.350–7.450)
PO2 ART: 348 mmHg — AB (ref 83.0–108.0)
Patient temperature: 98.6
TCO2: 24 mmol/L (ref 0–100)

## 2016-12-26 LAB — TRIGLYCERIDES: Triglycerides: 144 mg/dL (ref <150)

## 2016-12-26 LAB — ABO/RH: ABO/RH(D): O POS

## 2016-12-26 LAB — PATHOLOGIST SMEAR REVIEW

## 2016-12-26 LAB — PREPARE RBC (CROSSMATCH)

## 2016-12-26 LAB — HEMOGLOBIN AND HEMATOCRIT, BLOOD
HEMATOCRIT: 30 % — AB (ref 39.0–52.0)
HEMOGLOBIN: 9.7 g/dL — AB (ref 13.0–17.0)

## 2016-12-26 LAB — MAGNESIUM: MAGNESIUM: 1.8 mg/dL (ref 1.7–2.4)

## 2016-12-26 MED ORDER — FENTANYL BOLUS VIA INFUSION
50.0000 ug | INTRAVENOUS | Status: DC | PRN
  Administered 2016-12-27: 75 ug via INTRAVENOUS
  Filled 2016-12-26 (×2): qty 50

## 2016-12-26 MED ORDER — PROPOFOL 1000 MG/100ML IV EMUL
0.0000 ug/kg/min | INTRAVENOUS | Status: DC
  Administered 2016-12-26: 5 ug/kg/min via INTRAVENOUS
  Administered 2016-12-27 (×2): 30 ug/kg/min via INTRAVENOUS
  Filled 2016-12-26 (×3): qty 100

## 2016-12-26 MED ORDER — FENTANYL CITRATE (PF) 100 MCG/2ML IJ SOLN
100.0000 ug | Freq: Once | INTRAMUSCULAR | Status: AC
  Administered 2016-12-26: 100 ug via INTRAVENOUS

## 2016-12-26 MED ORDER — MIDAZOLAM HCL 2 MG/2ML IJ SOLN
2.0000 mg | Freq: Once | INTRAMUSCULAR | Status: AC
  Administered 2016-12-26: 2 mg via INTRAVENOUS

## 2016-12-26 MED ORDER — CHLORHEXIDINE GLUCONATE 0.12% ORAL RINSE (MEDLINE KIT)
15.0000 mL | Freq: Two times a day (BID) | OROMUCOSAL | Status: DC
  Administered 2016-12-26 – 2016-12-27 (×2): 15 mL via OROMUCOSAL

## 2016-12-26 MED ORDER — ORAL CARE MOUTH RINSE: 15.0000 mL | OROMUCOSAL | Status: DC

## 2016-12-26 MED ORDER — HYDROMORPHONE HCL 1 MG/ML IJ SOLN
1.0000 mg | Freq: Once | INTRAMUSCULAR | Status: AC
  Administered 2016-12-26: 0.5 mg via INTRAVENOUS

## 2016-12-26 MED ORDER — LIDOCAINE HCL (PF) 1 % IJ SOLN
INTRAMUSCULAR | Status: AC
  Filled 2016-12-26: qty 5

## 2016-12-26 MED ORDER — AZITHROMYCIN 200 MG/5ML PO SUSR
1200.0000 mg | ORAL | Status: DC
  Administered 2016-12-30: 1200 mg
  Filled 2016-12-26: qty 30

## 2016-12-26 MED ORDER — SODIUM CHLORIDE 0.9 % IV SOLN
Freq: Once | INTRAVENOUS | Status: AC
  Administered 2016-12-26: 09:00:00 via INTRAVENOUS

## 2016-12-26 MED ORDER — ETOMIDATE 2 MG/ML IV SOLN
20.0000 mg | Freq: Once | INTRAVENOUS | Status: AC
  Administered 2016-12-26: 20 mg via INTRAVENOUS

## 2016-12-26 MED ORDER — VALACYCLOVIR HCL 500 MG PO TABS
1000.0000 mg | ORAL_TABLET | Freq: Two times a day (BID) | ORAL | Status: AC
  Administered 2016-12-27 – 2017-01-01 (×12): 1000 mg
  Filled 2016-12-26 (×14): qty 2

## 2016-12-26 MED ORDER — ACETAMINOPHEN 160 MG/5ML PO SOLN: 650.0000 mg | Freq: Four times a day (QID) | ORAL | Status: DC | PRN

## 2016-12-26 MED ORDER — HYDROMORPHONE HCL 1 MG/ML IJ SOLN: 1.0000 mg | Freq: Once | INTRAMUSCULAR | Status: DC

## 2016-12-26 MED ORDER — FENTANYL 2500MCG IN NS 250ML (10MCG/ML) PREMIX INFUSION
25.0000 ug/h | INTRAVENOUS | Status: DC
  Administered 2016-12-26: 50 ug/h via INTRAVENOUS
  Administered 2016-12-27: 150 ug/h via INTRAVENOUS
  Administered 2016-12-27: 25 ug/h via INTRAVENOUS
  Administered 2016-12-29 – 2016-12-30 (×2): 50 ug/h via INTRAVENOUS
  Filled 2016-12-26 (×4): qty 250

## 2016-12-26 MED ORDER — POTASSIUM CHLORIDE CRYS ER 20 MEQ PO TBCR
30.0000 meq | EXTENDED_RELEASE_TABLET | Freq: Two times a day (BID) | ORAL | Status: DC
  Administered 2016-12-26: 11:00:00 30 meq via ORAL
  Filled 2016-12-26: qty 1

## 2016-12-26 MED ORDER — SULFAMETHOXAZOLE-TRIMETHOPRIM 800-160 MG PO TABS: 1.0000 | ORAL_TABLET | Freq: Two times a day (BID) | ORAL | Status: DC

## 2016-12-26 MED ORDER — HYDROMORPHONE HCL 1 MG/ML IJ SOLN: 1.0000 mg | Freq: Four times a day (QID) | INTRAMUSCULAR | Status: DC | PRN

## 2016-12-26 MED ORDER — BUTAMBEN-TETRACAINE-BENZOCAINE 2-2-14 % EX AERO: 1.0000 | INHALATION_SPRAY | Freq: Once | CUTANEOUS | Status: AC

## 2016-12-26 MED ORDER — PHENYLEPHRINE HCL 0.25 % NA SOLN: 1.0000 | Freq: Four times a day (QID) | NASAL | Status: DC | PRN

## 2016-12-26 MED ORDER — MAGNESIUM SULFATE IN D5W 1-5 GM/100ML-% IV SOLN
1.0000 g | Freq: Once | INTRAVENOUS | Status: AC
  Administered 2016-12-26: 1 g via INTRAVENOUS
  Filled 2016-12-26: qty 100

## 2016-12-26 MED ORDER — ROCURONIUM BROMIDE 50 MG/5ML IV SOLN
50.0000 mg | Freq: Once | INTRAVENOUS | Status: AC
  Administered 2016-12-26: 50 mg via INTRAVENOUS

## 2016-12-26 MED ORDER — DOCUSATE SODIUM 50 MG/5ML PO LIQD: 100.0000 mg | Freq: Two times a day (BID) | ORAL | Status: DC | PRN

## 2016-12-26 MED ORDER — LIDOCAINE HCL 2 % EX GEL: 1.0000 "application " | Freq: Once | CUTANEOUS | Status: AC

## 2016-12-26 MED ORDER — FLUCYTOSINE 250 MG PO CAPS
1500.0000 mg | ORAL_CAPSULE | Freq: Four times a day (QID) | ORAL | Status: DC
  Administered 2016-12-27 – 2017-01-12 (×52): 1500 mg
  Filled 2016-12-26 (×14): qty 6
  Filled 2016-12-26 (×2): qty 3
  Filled 2016-12-26 (×32): qty 6
  Filled 2016-12-26: qty 3
  Filled 2016-12-26 (×9): qty 6
  Filled 2016-12-26: qty 3
  Filled 2016-12-26: qty 6
  Filled 2016-12-26: qty 3
  Filled 2016-12-26 (×13): qty 6
  Filled 2016-12-26: qty 3
  Filled 2016-12-26 (×3): qty 6

## 2016-12-26 MED ORDER — MIDAZOLAM HCL 2 MG/2ML IJ SOLN
2.0000 mg | INTRAMUSCULAR | Status: DC | PRN
  Administered 2016-12-28 – 2016-12-31 (×4): 2 mg via INTRAVENOUS
  Filled 2016-12-26 (×5): qty 2

## 2016-12-26 MED ORDER — POTASSIUM CHLORIDE 20 MEQ/15ML (10%) PO SOLN
30.0000 meq | Freq: Two times a day (BID) | ORAL | Status: AC
  Administered 2016-12-26 – 2016-12-27 (×2): 30 meq
  Filled 2016-12-26 (×2): qty 30

## 2016-12-26 MED ORDER — NALOXONE HCL 0.4 MG/ML IJ SOLN
INTRAMUSCULAR | Status: AC
  Filled 2016-12-26: qty 1

## 2016-12-26 MED ORDER — ORAL CARE MOUTH RINSE
15.0000 mL | Freq: Four times a day (QID) | OROMUCOSAL | Status: DC
  Administered 2016-12-27 (×3): 15 mL via OROMUCOSAL

## 2016-12-26 MED ORDER — FENTANYL CITRATE (PF) 100 MCG/2ML IJ SOLN
INTRAMUSCULAR | Status: AC
  Filled 2016-12-26: qty 4

## 2016-12-26 MED ORDER — HEPARIN SODIUM (PORCINE) 5000 UNIT/ML IJ SOLN: 5000.0000 [IU] | Freq: Two times a day (BID) | INTRAMUSCULAR | Status: DC

## 2016-12-26 MED ORDER — FAMOTIDINE IN NACL 20-0.9 MG/50ML-% IV SOLN
20.0000 mg | Freq: Two times a day (BID) | INTRAVENOUS | Status: DC
  Administered 2016-12-26 – 2017-01-02 (×14): 20 mg via INTRAVENOUS
  Filled 2016-12-26 (×14): qty 50

## 2016-12-26 MED ORDER — MIDAZOLAM HCL 2 MG/2ML IJ SOLN
INTRAMUSCULAR | Status: AC
  Filled 2016-12-26: qty 4

## 2016-12-26 MED ORDER — BISACODYL 10 MG RE SUPP: 10.0000 mg | Freq: Every day | RECTAL | Status: DC | PRN

## 2016-12-26 MED ORDER — CHLORHEXIDINE GLUCONATE 0.12% ORAL RINSE (MEDLINE KIT): 15.0000 mL | Freq: Two times a day (BID) | OROMUCOSAL | Status: DC

## 2016-12-26 MED ORDER — SULFAMETHOXAZOLE-TRIMETHOPRIM 200-40 MG/5ML PO SUSP
20.0000 mL | Freq: Two times a day (BID) | ORAL | Status: DC
  Administered 2016-12-26 – 2016-12-28 (×4): 20 mL
  Filled 2016-12-26 (×4): qty 20

## 2016-12-26 NOTE — Progress Notes (Signed)
Name: William Pena MRN: [id number] DOB: [birth year]    ADMISSION DATE:  12/20/2016 CONSULTATION DATE:  5/24 REFERRING MD :  Baxter Flattery   CHIEF COMPLAINT:  Request for BAL in setting of cavitary pulmonary lesions   BRIEF  This is a 31 year old make w/ HIV/AIDS (last CD4 1 w/ VL 81K). Just started back on ART 2 months ago however he was incarcerated and reported he was not given his medications. He was admitted on 5/22 for perirectal abscess. During admission he was also noted to have HA, fever, chills, night sweats neck pain and LAN. ID was consulted and recommended imaging of chest, LP, and cultures be sent from perirectal I&D. Since that time CT chest results: "Bilateral airspace disease with a small cavitary lesion in the right upper lobe. Findings compatible with pneumonia.Trace right pleural fluid versus right pleural thickening". Also his serum cryptococcal antigenemia titer was elevated raising the possibility of disseminated cryptococcal infection. Including lung involvement. We have been asked to see by infectious disease for bronchoscopy and washings in effort to help determine if CT changes reflect mTB OR possibly pulmonary cryptococcal disease. Initially unable to perform bronchoscopy due to hypoxia.   SIGNIFICANT EVENTS   STUDIES:  CT head 5/24: nml CT chest 5/24: Bilateral airspace disease with a small cavitary lesion in the right upper lobe. Findings compatible with pneumonia. Trace right pleural fluid versus right pleural thickening. CT abd/pelvis 5/23: Edema tracking along the right side of the gluteal cleft is similar in appearance to the prior study. This reflected a subcutaneous abscess on the prior study. This most likely reflects a persistent small subcutaneous abscess. 2. Diffuse soft tissue edema tracking about the anorectal canal, without additional abscess. Diffuse presacral soft tissue inflammation noted. 3. Scrotal wall edema, worse on the right, with small bilateral  Hydroceles. 4. Mild right basilar airspace opacity raises concern for pneumonia. 5. Mild wall thickening along the distal sigmoid colon and rectum, raising concern for proctitis. 6. Enlarged azygoesophageal recess, retroperitoneal, pelvic sidewall and bilateral inguinal nodes seen, measuring up to 1.7 cm in short axis. This likely reflects the underlying infection. 7. Cholelithiasis.  Gallbladder otherwise unremarkable.  Culture data 5/23 BCX2>>> CRYPTOCOCCUS NEOFORMANS  AFB 5/23>> Surgical wound culture 5/23: few wbc, few gm variable rods>>> Acid fast culture w/ reflexed sensitivities from sputum 5/23>>> afb from sputum 5/23>>> BAL 5/28 >  ABX azith 5/22>>> Cefepime 5/22>>>5/23 Ceftriaxone 5/23>>> Flagyl 5/23>>> vanc 5/23>>> Amphotericin B 5/24>>> Flucytosine 5/24>>>   SUBJECTIVE/OVERNIGHT/INTERVAL HX: 5/28 called by primary for altered LOC. He became very lethargic/obtunded requiring CT and LP, but airway was an issue so PCCM asked to see for ICU transfer and intubation.    VITAL SIGNS: Temp:  [97 F (36.1 C)-98.7 F (37.1 C)] 98.4 F (36.9 C) (05/28 1522) Pulse Rate:  [32-81] 62 (05/28 1850) Resp:  [14-21] 20 (05/28 1522) BP: (119-137)/(75-103) 132/87 (05/28 1522) SpO2:  [87 %-100 %] 100 % (05/28 1850)  PHYSICAL EXAMINATION: General:  Young male with multiple tattoos. Normal body habitus.  Neuro:  Lethargic, will arouse with noxious stimuli. Moving R side, not left. Intermittent shaking of R arm.  HEENT:  [place]/AT, No JVD noted, PERRL Cardiovascular:  RRR, no MRG Lungs:  coarse Abdomen:  Soft, non-distended Musculoskeletal:  No acute deformity Skin:  Intact, MMM, dry flaky skin on face.   PULMONARY  Recent Labs Lab 12/23/16 1310  PHART 7.406  PCO2ART 31.9*  PO2ART 72.8*  HCO3 19.6*  O2SAT 94.9    CBC  Recent  Labs Lab 12/24/16 0249 12/25/16 0345 12/26/16 0230  HGB 7.8* 7.5* 6.9*  HCT 24.8* 24.1* 22.3*  WBC 8.2 8.7 6.9  PLT 64* 79* 108*     COAGULATION  Recent Labs Lab 12/22/16 1125  INR 1.20    CARDIAC  No results for input(s): TROPONINI in the last 168 hours. No results for input(s): PROBNP in the last 168 hours.   CHEMISTRY  Recent Labs Lab 12/22/16 0658 12/23/16 0507 12/24/16 0249 12/25/16 0345 12/26/16 0230  NA 125* 129* 133* 136 136  K 3.4* 4.7 3.3* 4.1 3.3*  CL 95* 101 109 112* 112*  CO2 24 21* 19* 18* 19*  GLUCOSE 107* 73 104* 84 86  BUN _0 <5*  CREATININE 0.79 1.12 1.05 0.82 0.88  CALCIUM 7.3* 7.5* 7.5* 7.9* 8.0*  MG  --  1.3* 2.3 1.7 1.8  PHOS  --   --   --  2.2*  --    Estimated Creatinine Clearance: 109.8 mL/min (by C-G formula based on SCr of 0.88 mg/dL).   LIVER  Recent Labs Lab 12/20/16 0647 12/20/16 1947 12/22/16 0658 12/22/16 1125  AST 62* 139* 154*  --   ALT 38 62 89*  --   ALKPHOS 75 76 82  --   BILITOT 0.6 1.3* 0.8  --   PROT 9.2* 8.4* 7.7  --   ALBUMIN 2.9* 2.6* 2.2*  --   INR  --   --   --  1.20     INFECTIOUS  Recent Labs Lab 12/20/16 2201 12/22/16 2053 12/23/16 1124 12/23/16 1400 12/24/16 0249 12/25/16 0345  LATICACIDVEN 1.14 1.5  --  1.3  --   --   PROCALCITON  --   --  10.72  --  8.72 4.24     ENDOCRINE CBG (last 3)  No results for input(s): GLUCAP in the last 72 hours.   IMAGING x48h  - image(s) personally visualized  -   highlighted in bold Ct Head Wo Contrast  Result Date: 12/26/2016 CLINICAL DATA:  Meningitis. EXAM: CT HEAD WITHOUT CONTRAST TECHNIQUE: Contiguous axial images were obtained from the base of the skull through the vertex without intravenous contrast. COMPARISON:  CT scan of Dec 23, 2016. FINDINGS: Brain: No evidence of acute infarction, hemorrhage, hydrocephalus, extra-axial collection or mass lesion/mass effect. Vascular: No hyperdense vessel or unexpected calcification. Skull: Normal. Negative for fracture or focal lesion. Sinuses/Orbits: No acute finding. Other: None. IMPRESSION: Normal head CT. Electronically Signed    By: Marijo Conception, M.D.   On: 12/26/2016 18:37    DISCUSSION: 31 year old male with history HIV/AIDS admitted [facility]/22 from jail for fevers with known perirectal abscess. 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 5/28 worsening mental status requiring intubation for LP and CT. PCCM to assume care.   ASSESSMENT / PLAN:  PULMONARY A: Acute hypoxemic respiratory failure Diffuse bilateral infiltrates - disseminated cryptococcus Cavitary lesions  P:   Full vent support CXR ABG VAP bundle ABX as below FOB with BAL done 5/28   CARDIOVASCULAR A:  No acute issues  P:  Tele monitoring in ICU  RENAL A:   Hyponatremia secondary to SIADH Hypokalemia  P:   Monitor BMP Fluid restrict  GASTROINTESTINAL A:   No acute issues  P:   NPO Protonix  HEMATOLOGIC A:   No acute issues   P:  Follow CBC SQ heparin SCDs  INFECTIOUS A:   Disseminated crypto Cavitary PNA Rule out TB Perirectal abscess HIV/AIDS (CD4 count  21) Genital herpes  P:   ID following Day 5 L-ampho/flycytosine Day 5 acyclovir Day 7 of proph bactrim/weekly azithro  ENDOCRINE A:   No acute issues  P:   Follow glucose on BMP  NEUROLOGIC A:   Cryptococcal meningitis  P:   RASS goal: 0 Propofol infusion PRN fentanyl infusion LP today by Neurology  FAMILY  - Updates: No family available  - Inter-disciplinary family meet or Palliative Care meeting due by:  6/3   Georgann Housekeeper, AGACNP-BC [place] [facility]/Critical [facility] Pager [phone] or [phone]  12/26/2016 7:21 PM

## 2016-12-26 NOTE — Progress Notes (Signed)
MD at bedside to perform LP. Pt lethargic and worsening mental status. MD and RN took pt to CT STAT and rapid was called. PCCM at bedside and pt taken to 2H03.

## 2016-12-26 NOTE — Progress Notes (Signed)
[facility] for Infectious Disease    Date of Admission:  12/20/2016   Total days of antibiotics 7        Day 5 L-ampho/flycytosine        Day 5 acyclovir        Day 7 oi proph bactrim/weekly azithro   ID: Tanor Glaspy is a 31 y.o. male with advanced hiv disease/AIDS admitted for perirectal abscess found to have cryptococcemia and cryptococcal meningitis Principal Problem:   Perirectal abscess Active Problems:   Chronic pain   AIDS (acquired immune deficiency syndrome) (HCC)   Hyponatremia   Normocytic anemia   Elevated LFTs   Pulmonary infiltrates   Hypomagnesemia   Encephalopathy acute   SIRS (systemic inflammatory response syndrome) (HCC)   Acute respiratory failure (HCC)   Rectal abscess   Cryptococcal meningoencephalitis (HCC)   Cryptococcosis (HCC)   Cavitary pneumonia   Diffuse lymphadenopathy   Drug rash   Elevated intracranial pressure   HIV disease (HCC)   Lymphadenopathy of head and neck    Subjective: He reported having headache yesterday and underwent bedside LP with >> elevated OP of 55. Today feels headache is improved this am. Repeat csf count shows wbc of 29, glu 45 but seg cells of 75%, gram stain with budding yeast  Addendum: having severe frontal L sided headache this afternoon  RN reports having some incontinence, occasionally getting into wound bed  Lab work shows hypokalemia and anemia  He has been released from jail/no longer has guard at bedside since 5/25. Family aware of condition of his health  Medications:  . azithromycin  1,200 mg Oral Weekly  . flucytosine  1,500 mg Oral Q6H  . heparin subcutaneous  5,000 Units Subcutaneous Q12H  . lidocaine (PF)  5 mL Other Once  . magic mouthwash  5 mL Oral QID  . potassium chloride  30 mEq Oral BID  . sulfamethoxazole-trimethoprim  1 tablet Oral Q12H  . valACYclovir  1,000 mg Oral BID    Objective: Vital signs in last 24 hours: Temp:  [97 F (36.1 C)-98.7 F (37.1 C)] 98.4 F  (36.9 C) (05/28 1522) Pulse Rate:  [25-81] 65 (05/28 0736) Resp:  [14-21] 20 (05/28 1522) BP: (119-137)/(75-103) 132/87 (05/28 1522) SpO2:  [76 %-100 %] 100 % (05/28 1522) Physical Exam  Constitutional: [name] oriented to person, place, and time. He appears well-developed and well-nourished. mild distress from headache HENT:  Mouth/Throat: Oropharynx is clear and moist. No oropharyngeal exudate.  Neck: mild nuchal rigidity Cardiovascular: Normal rate, regular rhythm and normal heart sounds. Exam reveals no gallop and no friction rub.  No murmur heard.  Pulmonary/Chest: Effort normal and breath sounds normal. No respiratory distress. He has no wheezes.  Abdominal: Soft. Bowel sounds are normal. He exhibits no distension. There is no tenderness.  Back: numerous needle marks from LP but no associated erythema Lymphadenopathy: + cervical adenopathy.  Neurological: [name] alert and oriented to person, place, and time.  Skin: Skin is warm and dry. Dry flaky skin to the face. No longer has erythema. Numerous tattoos Psychiatric: impatient due to pain.    Lab Results  Recent Labs  12/25/16 0345 12/26/16 0230  WBC 8.7 6.9  HGB 7.5* 6.9*  HCT 24.1* 22.3*  NA 136 136  K 4.1 3.3*  CL 112* 112*  CO2 18* 19*  BUN 6 <5*  CREATININE 0.82 0.88    Microbiology: 5/27 csf - +growth budding yeast on gram stain 5/24 csf cryptococcus neoformans  5/23 blood cx cryptococcus neoformans  Assessment/Plan: Disseminated cryptococcal disease including CM - continue with L-ampho plus flucytosine. Close monitoring for side effects/repletion of electrolytes. Currently on day 5 of 14. Oddly, repeat LP now shows neutrophilic predominance. Will send repeat LP and body fluid count tomorrow. Watch for ICP signs  HA = spoke with dr. Leonel Ramsay who is helping [place] out to do LP this evening. Recommend giving 1-2mg  ativan plus 1-2mg  dilaudid for procedure. Will give 1mg  dilaudid now for headache  HIV/AIDS =  continue on oi proph with bactrim Ss daily plus azithromycin weekly  Genital herpes =currently day 5 of 7-10 on acyclovir  Perirectal abscess= treated with short course of abtx. Continue with daily dressing changes  Cavitary lesions = will still need bronchoscopy to rule out mTB given clinical history and being incarcerated  Lymphadenopathy = still follow up with IR when they will be doing procesdure early in the week  Fever= improving curve, but temp of 100.7 yesterday   Baxter Flattery [facility] for Infectious Diseases Cell: [phone] Pager: [phone]  12/26/2016, 5:03 PM

## 2016-12-26 NOTE — Progress Notes (Signed)
Patient refusing lab draw for type and screen will have them redraw.

## 2016-12-26 NOTE — Progress Notes (Signed)
Patient's mom called back and gave verbal order over the phone for blood consent with a second nurse verification at this time.  Lab still coming to draw type and screen to process blood.

## 2016-12-26 NOTE — Telephone Encounter (Addendum)
Post ED Visit - Positive Culture Follow-up  Culture report reviewed by antimicrobial stewardship pharmacist:  []  Elenor Quinones, Pharm.D. []  Heide Guile, Pharm.D., BCPS AQ-ID []  Parks Neptune, Pharm.D., BCPS []  Alycia Rossetti, Pharm.D., BCPS []  Wann, [place].D., BCPS, AAHIVP []  Legrand Como, Pharm.D., BCPS, AAHIVP [x]  Salome Arnt, PharmD, BCPS []  Dimitri Ped, PharmD, BCPS []  Vincenza Hews, PharmD, BCPS  Positive blood culture Treated with sulfamethoxazole-trimethoprim and clindamycin and metronidazole, admitted to [facility] and no further patient follow-up is required at this time.  Hazle Nordmann 12/26/2016, 12:20 PM

## 2016-12-26 NOTE — Progress Notes (Signed)
Patient confused and acting out of chararter throughout the night after procedure and medications given for it.  Called mother William Pena for blood consent and left message at [phone].

## 2016-12-26 NOTE — Procedures (Signed)
Intubation Procedure Note Kao Conry [id number] [birth year]  Procedure: Intubation Indications: Airway protection and maintenance  Procedure Details Consent: Unable to obtain consent because of emergent medical necessity. Time Out: Verified patient identification, verified procedure, site/side was marked, verified correct patient position, special equipment/implants available, medications/allergies/relevent history reviewed, required imaging and test results available.  Performed  Maximum clean technique was used including cap, gloves, hand hygiene and mask.  MAC and 4 Glidescope  Medications: Fentanyl 158mg Versed 277mEtomidate 2066mocuronium 53m11mrade 1 airway view  Positive color change on EZ cap  Positive BL breath sounds.   Evaluation Hemodynamic Status: BP stable throughout; O2 sats: stable throughout Patient's Current Condition: stable Complications: No apparent complications Patient did tolerate procedure well. Chest X-ray ordered to verify placement.  CXR: pending.   PaulGeorgann HousekeeperACNP-BC [facility] Pager [phone]/2018 7:41 PM

## 2016-12-26 NOTE — Consult Note (Addendum)
Neurology Consultation Reason for Consult: Cryptococcal meningitis.  Referring Physician: Abelino Derrick  CC: Obtundation  History is obtained from: Nurse, referring physician  HPI: William Pena is a 31 y.o. male with recently diagnosed cryptococcal meningitis who was doing well earlier in the day but has progressively been complaining of worsening headache, and then more recently became less responsive. He was having some incontinence as well.  On my arrival at bedside, he was obtunded, with focal exam findings of decreased movement on the left side and had a bradycardia down to the low 50s. He was taking now for a stat head CT, was having Cheyne-Stokes breathing en route with occasional long pauses. He was taken back up to the ICU for intubation and after the head CT revealed no focal lesions or contrast medications to lumbar puncture  I performed LP at the bedside. After reducing his pressure from 54-23, he actually was seen to have more movement on the left side.   ROS:  to obtain due to altered mental status.   Past Medical History:  Diagnosis Date  . HIV (human immunodeficiency virus infection) ([place])   . Perirectal abscess      Family History  Problem Relation Age of Onset  . Diabetes Other      Social History:  reports that he has quit smoking. His smoking use included Cigarettes. He smoked 0.33 packs per day. He has never used smokeless tobacco. He reports that he does not drink alcohol or use drugs.   Exam: Current vital signs: BP (!) 146/101   Pulse 62   Temp 98.4 F (36.9 C) (Oral)   Resp (!) 24   Ht 5\' 6"  (1.676 m)   Wt 64.2 kg (141 lb 8 oz)   SpO2 100%   BMI 22.84 kg/m  Vital signs in last 24 hours: Temp:  [97.3 F (36.3 C)-98.7 F (37.1 C)] 98.4 F (36.9 C) (05/28 1522) Pulse Rate:  [32-81] 62 (05/28 1850) Resp:  [14-24] 24 (05/28 1850) BP: (120-146)/(75-103) 146/101 (05/28 1850) SpO2:  [87 %-100 %] 100 % (05/28 1850) FiO2 (%):  [100 %] 100 % (05/28  1850)   Physical Exam  Constitutional: Ill appearing Psych: Affect appropriate to situation Eyes: No scleral injection HENT: No OP obstrucion Head: Normocephalic.  Cardiovascular: Normal rate and regular rhythm.  Respiratory: Effort normal and breath sounds normal to anterior ascultation GI: Soft.  No distension. There is no tenderness.  Skin: Wounds on the sacral area  Neuro: Mental Status: Patient is obtunded, he does localize with his right arm but does not follow commands or interact. Cranial Nerves: II: He does not blink to threat from either direction, but his pupils are equal and reactive III,IV, VI: Eyes are conjugate and midline, he resists any attempt check doll's eye V: VII: He blinks to eyelid stimulation bilaterally Motor: He moves his right side purposefully, minimal withdrawal in the left leg and does not move his left arm. Sensory: He localizes on the right, but does not withdrawal on the left, does not really grimace on either side Cerebellar: He does not perform   I have reviewed labs in epic and the results pertinent to this consultation are: CSF WBC 29 Elevated protein at 122 CSF crypto antigen >2560  I have reviewed the images obtained: CT head-no acute findings  Impression: 31 year old male who developed progressive worsening mental status along with focal neurological symptoms in the setting of elevated ICP. Symptoms did seem to improve after lumbar puncture, but I'm not  sure of the pathophysiology of these symptoms. One concern with crypto meningitis is ischemic infarct and I do think that he needs an MRI to evaluate for this.  Recommendations: 1) stat MRI 2) I have discussed with Dr. Kathyrn Sheriff who will consider CSF drainage. 3) antibiotics per infectious disease 4) neurology will continue to follow  This patient is critically ill and at significant risk of neurological worsening, death and care requires constant monitoring of vital signs,  hemodynamics,respiratory and cardiac monitoring, neurological assessment, discussion with family, other specialists and medical decision making of high complexity. I spent 45 minutes of neurocritical care time  in the care of  this patient.  Roland Rack, MD [facility] [phone]  If 7pm- 7am, please page neurology on call as listed in [place]. 12/26/2016  8:30 PM

## 2016-12-26 NOTE — Procedures (Signed)
Indication: Elevated ICP  Risks of the procedure were dicussed with the patient including post-LP headache, bleeding, infection, weakness/numbness of legs(radiculopathy), death.  The patient/patient's proxy agreed and written consent was obtained.   The patient was prepped and draped, and using sterile technique a 20 gauge quinke spinal needle was inserted in the L 3-4 space. There was a quick flash of blood indicating venous access but then with repositioning the needle blood-tinged CSF which quickly cleared was obtained. The opening pressure was 53 cm H2O. Closing pressure was 23 CM H2O.  Roland Rack, MD [facility] [phone]  If 7pm- 7am, please page neurology on call as listed in [place].

## 2016-12-26 NOTE — Progress Notes (Addendum)
12 lead ekg from 12/26/16 @ 0442 done in error.

## 2016-12-26 NOTE — Progress Notes (Addendum)
Consult  PROGRESS NOTE                                                                                                                                                                                                             Patient Demographics:    William Pena, is a 31 y.o. male, DOB - Mar 02, 1986, NLZ:[id number]  Admit date - 12/20/2016   Admitting Physician Md Edison Pace, MD  Outpatient Primary MD for the patient is [name], [name], MD  LOS - 5   Chief Complaint  Patient presents with  . Rectal Bleeding  . 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  [facility] hospitalist took over primary management on 5/24 given significant comorbidities.   Subjective:    [name] today Denies any diarrhea, abdominal pain, nausea or vomiting, when asked about headache today, he said he has mild headache, denies any visual disturbances.   Assessment  & Plan :    Principal Problem:   Perirectal abscess Active Problems:   Chronic pain   AIDS (acquired immune deficiency syndrome) (HCC)   Hyponatremia   Normocytic anemia   Elevated LFTs   Pulmonary infiltrates   Hypomagnesemia   Encephalopathy acute   SIRS (systemic inflammatory response syndrome) (HCC)   Acute respiratory failure (HCC)   Rectal abscess   Cryptococcal meningoencephalitis (HCC)   Cryptococcosis (HCC)   Cavitary pneumonia   Diffuse lymphadenopathy   Drug rash   Elevated intracranial pressure   HIV  disease (HCC)   Lymphadenopathy of head and neck   Perirectal abscess - Status post I&D 5/23 by Dr. Rosendo Gros, wound care per surgical team, initially on vancomycin and cefepime, changed to Augmentin and doxycycline, Augmentin stopped secondary to rash, currently on Rocephin and Bactrim which will provide PCP prophylaxis as well.  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 - Patient with headache, so he required LP 5/24, another repeat LP in 5/27 with opening pressure>55, will need LP frequently daily versus every other day, discussed with neurosurgery, at this point no indication yet for shunt or drain, but it continues here for another 2-3 days then be appropriate, Dr. Titus Mould to perform LP tomorrow.  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  Thrombocytopenia - Most likely due to infectious process, as well patient is on Flucytosine, monitor closely, improving. TODAY IN 108.  Elevated LFTs: - Mild.Trend  Cavitary Pneumonia - CT chest with right upper small  cavitary lesions, suspicion for mTB vs. MAC vs Crypto lung involvement - Bronchoscopy canceled on 5/25 secondary to frequent respiratory status, appears to be better, he will need bronchoscopy.  Acute encephalopathy - Secondary to narcotics, resolved .  Rash - Secondary to Augmentin, is improving.  Lymphadenopathy - Lymph node biopsy by IR. Discuss with IR, scheduled for tomorrow  Hypophosphatemia  - repleted.  Hypokalemia - Repleted   Code Status : Full  Family Communication  : D/W patient  Consults : Surgery, ID, IR, PCCM  Procedures  : LP 5/23, LP 5/27  DVT Prophylaxis  : SCDs, Roberts heparin  Lab Results  Component Value Date   PLT 108 (L) 12/26/2016    Antibiotics  :    Anti-infectives    Start     Dose/Rate Route Frequency Ordered Stop   12/23/16 2200  sulfamethoxazole-trimethoprim (BACTRIM DS,SEPTRA DS) 800-160 MG per tablet 1 tablet     1 tablet Oral Every 12 hours 12/23/16 1822     12/23/16 1900  cefTRIAXone (ROCEPHIN) 2 g in dextrose 5 % 50 mL IVPB     2 g 100 mL/hr over 30 Minutes Intravenous Every 24 hours 12/23/16 1821     12/23/16 0900  amphotericin B liposome (AMBISOME) 200 mg in dextrose 5 % 500 mL IVPB     200 mg 250 mL/hr over 120 Minutes Intravenous Every 24 hours 12/22/16 0919     12/23/16 0800  azithromycin (ZITHROMAX) tablet 1,200 mg     1,200 mg Oral Weekly 12/22/16 0811     12/22/16 2200  valACYclovir (VALTREX) tablet 1,000 mg     1,000 mg Oral 2 times daily 12/22/16 1611 12/29/16 2159   12/22/16 1800  amoxicillin-clavulanate (AUGMENTIN) 875-125 MG per tablet 1 tablet  Status:  Discontinued     1 tablet Oral 2 times daily with meals 12/22/16 1446 12/23/16 1821   12/22/16 1800  doxycycline (VIBRA-TABS) tablet 100 mg  Status:  Discontinued     100 mg Oral 2 times daily with meals 12/22/16 1446 12/23/16 1821   12/22/16 1200  flucytosine (ANCOBON) capsule 1,500 mg  1,500 mg Oral Every 6 hours 12/22/16 0614     12/22/16 1000  azithromycin  (ZITHROMAX) tablet 250 mg  Status:  Discontinued     250 mg Oral Daily 12/21/16 0224 12/21/16 1131   12/22/16 1000  doxycycline (VIBRA-TABS) tablet 100 mg  Status:  Discontinued     100 mg Oral Every 12 hours 12/22/16 0942 12/22/16 1446   12/22/16 0000  amphotericin B liposome (AMBISOME) 190 mg in dextrose 5 % 500 mL IVPB  Status:  Discontinued     3 mg/kg  64.2 kg 250 mL/hr over 120 Minutes Intravenous Every 24 hours 12/21/16 2313 12/22/16 0919   12/22/16 0000  flucytosine (ANCOBON) capsule 250 mg  Status:  Discontinued     250 mg Oral Every 6 hours 12/21/16 2314 12/21/16 2325   12/22/16 0000  flucytosine (ANCOBON) capsule 1,500 mg  Status:  Discontinued     1,500 mg Oral Every 6 hours 12/21/16 2326 12/22/16 0614   12/21/16 1645  cefTRIAXone (ROCEPHIN) 2 g in dextrose 5 % 50 mL IVPB  Status:  Discontinued     2 g 100 mL/hr over 30 Minutes Intravenous Every 24 hours 12/21/16 1539 12/22/16 1446   12/21/16 1645  metroNIDAZOLE (FLAGYL) IVPB 500 mg  Status:  Discontinued     500 mg 100 mL/hr over 60 Minutes Intravenous Every 8 hours 12/21/16 1539 12/21/16 2348   12/21/16 1600  cefTRIAXone (ROCEPHIN) 2 g in dextrose 5 % 50 mL IVPB  Status:  Discontinued     2 g 100 mL/hr over 30 Minutes Intravenous Every 24 hours 12/21/16 1131 12/21/16 1558   12/21/16 1400  vancomycin (VANCOCIN) IVPB 750 mg/150 ml premix  Status:  Discontinued     750 mg 150 mL/hr over 60 Minutes Intravenous Every 8 hours 12/21/16 0142 12/22/16 0942   12/21/16 1000  sulfamethoxazole-trimethoprim (BACTRIM DS,SEPTRA DS) 800-160 MG per tablet 1 tablet  Status:  Discontinued     1 tablet Oral Daily 12/21/16 0058 12/21/16 0100   12/21/16 1000  abacavir-dolutegravir-lamiVUDine (TRIUMEQ) 600-50-300 MG per tablet 1 tablet  Status:  Discontinued     1 tablet Oral Daily 12/21/16 0204 12/22/16 0903   12/21/16 1000  sulfamethoxazole-trimethoprim (BACTRIM DS,SEPTRA DS) 800-160 MG per tablet 1 tablet  Status:  Discontinued     1 tablet  Oral Daily 12/21/16 0204 12/23/16 1822   12/21/16 0800  ceFEPIme (MAXIPIME) 2 g in dextrose 5 % 50 mL IVPB  Status:  Discontinued     2 g 100 mL/hr over 30 Minutes Intravenous Every 8 hours 12/21/16 0137 12/21/16 1131   12/21/16 0600  piperacillin-tazobactam (ZOSYN) IVPB 3.375 g  Status:  Discontinued     3.375 g 12.5 mL/hr over 240 Minutes Intravenous Every 8 hours 12/21/16 0058 12/21/16 0132   12/21/16 0200  metroNIDAZOLE (FLAGYL) IVPB 500 mg  Status:  Discontinued     500 mg 100 mL/hr over 60 Minutes Intravenous Every 8 hours 12/21/16 0137 12/22/16 1446   12/21/16 0145  ceFEPIme (MAXIPIME) 2 g in dextrose 5 % 50 mL IVPB     2 g 100 mL/hr over 30 Minutes Intravenous  Once 12/21/16 0137 12/21/16 0358   12/21/16 0145  vancomycin (VANCOCIN) IVPB 1000 mg/200 mL premix     1,000 mg 200 mL/hr over 60 Minutes Intravenous  Once 12/21/16 0142 12/21/16 0429   12/21/16 0130  piperacillin-tazobactam (ZOSYN) IVPB 3.375 g  Status:  Discontinued     3.375 g 100 mL/hr over 30 Minutes Intravenous  Once  12/21/16 0115 12/21/16 0132   12/21/16 0130  azithromycin (ZITHROMAX) powder 1 g     1 g Oral  Once 12/21/16 0125 12/21/16 0328        Objective:   Vitals:   12/26/16 0600 12/26/16 0736 12/26/16 0857 12/26/16 0935  BP:  131/81 (!) 137/103 (!) 135/94  Pulse: (!) 32 65    Resp: 14 16 18 18   Temp:  97.3 F (36.3 C) 97.9 F (36.6 C) 97.4 F (36.3 C)  TempSrc:  Oral Oral Axillary  SpO2: (!) 87% 98% 100% 100%  Weight:      Height:        Wt Readings from Last 3 Encounters:  12/21/16 64.2 kg (141 lb 8 oz)  12/20/16 65.8 kg (145 lb)  12/17/16 65.8 kg (145 lb)     Intake/Output Summary (Last 24 hours) at 12/26/16 1114 Last data filed at 12/26/16 1029  Gross per 24 hour  Intake          4803.33 ml  Output             3147 ml  Net          1656.33 ml     Physical Exam  Awake, alert oriented 3, no apparent distress  Oral thrush continues to improve, almost gone  Low-density  bilaterally, no wheezing rales or rhonchi  S1, S2 here, no rubs, gallops  Abdomen soft, nontender, nondistended, bowel sounds present  no edema, clubbing or cyanosis     Data Review:    CBC  Recent Labs Lab 12/20/16 0647  12/22/16 0658 12/23/16 0507 12/24/16 0249 12/25/16 0345 12/26/16 0230  WBC 6.9  < > 9.3 6.2 8.2 8.7 6.9  HGB 9.9*  < > 8.3* 8.3* 7.8* 7.5* 6.9*  HCT 31.1*  < > 26.6* 26.6* 24.8* 24.1* 22.3*  PLT 131*  < > 95* 128* 64* 79* 108*  MCV 83.4  < > 82.9 82.9 83.2 82.3 81.7  MCH 26.5  < > 25.9* 25.9* 26.2 25.6* 25.3*  MCHC 31.8  < > 31.2 31.2 31.5 31.1 30.9  RDW 14.8  < > 14.7 15.0 15.1 15.3 15.4  LYMPHSABS 0.6*  --   --   --   --   --   --   MONOABS 0.2  --   --   --   --   --   --   EOSABS 0.1  --   --   --   --   --   --   BASOSABS 0.1  --   --   --   --   --   --   < > = values in this interval not displayed.  Chemistries   Recent Labs Lab 12/20/16 0647 12/20/16 1947  12/22/16 0658 12/23/16 0507 12/24/16 0249 12/25/16 0345 12/26/16 0230  NA 128* 127*  < > 125* 129* 133* 136 136  K 3.8 5.4*  < > 3.4* 4.7 3.3* 4.1 3.3*  CL 94* 98*  < > 95* 101 109 112* 112*  CO2 25 24  < > 24 21* 19* 18* 19*  GLUCOSE 93 89  < > 107* 73 104* 84 86  BUN 16 13  < > 7 10 10 6  <5*  CREATININE 0.93 1.02  < > 0.79 1.12 1.05 0.82 0.88  CALCIUM 8.2* 7.5*  < > 7.3* 7.5* 7.5* 7.9* 8.0*  MG  --   --   --   --  1.3* 2.3 1.7 1.8  AST 62* 139*  --  154*  --   --   --   --   ALT 38 62  --  89*  --   --   --   --   ALKPHOS 75 76  --  82  --   --   --   --   BILITOT 0.6 1.3*  --  0.8  --   --   --   --   < > = values in this interval not displayed. ------------------------------------------------------------------------------------------------------------------ No results for input(s): CHOL, HDL, LDLCALC, TRIG, CHOLHDL, LDLDIRECT in the last 72 hours.  No results found for:  HGBA1C ------------------------------------------------------------------------------------------------------------------ No results for input(s): TSH, T4TOTAL, T3FREE, THYROIDAB in the last 72 hours.  Invalid input(s): FREET3 ------------------------------------------------------------------------------------------------------------------ No results for input(s): VITAMINB12, FOLATE, FERRITIN, TIBC, IRON, RETICCTPCT in the last 72 hours.  Coagulation profile  Recent Labs Lab 12/22/16 1125  INR 1.20    No results for input(s): DDIMER in the last 72 hours.  Cardiac Enzymes No results for input(s): CKMB, TROPONINI, MYOGLOBIN in the last 168 hours.  Invalid input(s): CK ------------------------------------------------------------------------------------------------------------------ No results found for: BNP  Inpatient Medications  Scheduled Meds: . azithromycin  1,200 mg Oral Weekly  . flucytosine  1,500 mg Oral Q6H  . lidocaine (PF)  5 mL Other Once  . magic mouthwash  5 mL Oral QID  . potassium chloride  30 mEq Oral BID  . sulfamethoxazole-trimethoprim  1 tablet Oral Q12H  . valACYclovir  1,000 mg Oral BID   Continuous Infusions: . sodium chloride 75 mL/hr at 12/25/16 2031  . amphotericin  B  Liposome (AMBISOME) ADULT IV 200 mg (12/26/16 1029)  . cefTRIAXone (ROCEPHIN)  IV Stopped (12/25/16 1858)  . magnesium sulfate 1 - 4 g bolus IVPB    . sodium chloride Stopped (12/26/16 0947)  . sodium chloride Stopped (12/25/16 1330)   PRN Meds:.acetaminophen **OR** acetaminophen, diphenhydrAMINE **OR** diphenhydrAMINE, HYDROmorphone (DILAUDID) injection, [COMPLETED] ketorolac **FOLLOWED BY** ketorolac, ondansetron **OR** ondansetron (ZOFRAN) IV, oxyCODONE, polyethylene glycol, simethicone  Micro Results Recent Results (from the past 240 hour(s))  Culture, blood (Routine X 2) w Reflex to ID Panel     Status: Abnormal   Collection Time: 12/20/16  6:42 AM  Result Value Ref Range  Status   Specimen Description BLOOD LEFT ANTECUBITAL  Final   Special Requests   Final    BOTTLES DRAWN AEROBIC AND ANAEROBIC Blood Culture adequate volume   Culture  Setup Time   Final    AEROBIC BOTTLE ONLY YEAST CRITICAL VALUE NOTED.  VALUE IS CONSISTENT WITH PREVIOUSLY REPORTED AND CALLED VALUE. Performed at [facility], [place] [address] [place], [place] [address]    Culture CRYPTOCOCCUS NEOFORMANS (A)  Final   Report Status 12/26/2016 FINAL  Final  Culture, blood (Routine X 2) w Reflex to ID Panel     Status: Abnormal   Collection Time: 12/20/16  6:54 AM  Result Value Ref Range Status   Specimen Description BLOOD RIGHT ANTECUBITAL  Final   Special Requests IN PEDIATRIC BOTTLE Blood Culture adequate volume  Final   Culture  Setup Time   Final    IN PEDIATRIC BOTTLE YEAST CRITICAL RESULT CALLED TO, READ BACK BY AND VERIFIED WITHJerel Shepherd PHARMD, AT 5053 12/23/16 BY Rush Landmark Performed at [facility], [place] [address] [place], [place] [address]    Culture CRYPTOCOCCUS NEOFORMANS (A)  Final   Report Status 12/25/2016 FINAL  Final  Blood Culture ID Panel (Reflexed)  Status: None   Collection Time: 12/20/16  6:54 AM  Result Value Ref Range Status   Enterococcus species NOT DETECTED NOT DETECTED Final   Listeria monocytogenes NOT DETECTED NOT DETECTED Final   Staphylococcus species NOT DETECTED NOT DETECTED Final   Staphylococcus aureus NOT DETECTED NOT DETECTED Final   Streptococcus species NOT DETECTED NOT DETECTED Final   Streptococcus agalactiae NOT DETECTED NOT DETECTED Final   Streptococcus pneumoniae NOT DETECTED NOT DETECTED Final   Streptococcus pyogenes NOT DETECTED NOT DETECTED Final   Acinetobacter baumannii NOT DETECTED NOT DETECTED Final   Enterobacteriaceae species NOT DETECTED NOT DETECTED Final   Enterobacter cloacae complex NOT DETECTED NOT DETECTED Final   Escherichia coli NOT DETECTED NOT DETECTED Final   Klebsiella oxytoca NOT DETECTED NOT  DETECTED Final   Klebsiella pneumoniae NOT DETECTED NOT DETECTED Final   Proteus species NOT DETECTED NOT DETECTED Final   Serratia marcescens NOT DETECTED NOT DETECTED Final   Haemophilus influenzae NOT DETECTED NOT DETECTED Final   Neisseria meningitidis NOT DETECTED NOT DETECTED Final   Pseudomonas aeruginosa NOT DETECTED NOT DETECTED Final   Candida albicans NOT DETECTED NOT DETECTED Final   Candida glabrata NOT DETECTED NOT DETECTED Final   Candida krusei NOT DETECTED NOT DETECTED Final   Candida parapsilosis NOT DETECTED NOT DETECTED Final   Candida tropicalis NOT DETECTED NOT DETECTED Final    Comment: Performed at [facility], [address] [place], [place] [address]  Surgical pcr screen     Status: None   Collection Time: 12/21/16  2:25 AM  Result Value Ref Range Status   MRSA, PCR NEGATIVE NEGATIVE Final   Staphylococcus aureus NEGATIVE NEGATIVE Final    Comment:        The Xpert SA Assay (FDA approved for NASAL specimens in patients over 45 years of age), is one component of a comprehensive surveillance program.  Test performance has been validated by [facility] for patients greater than or equal to 97 year old. It is not intended to diagnose infection nor to guide or monitor treatment.   Blood culture (routine x 2)     Status: Abnormal   Collection Time: 12/21/16  4:11 AM  Result Value Ref Range Status   Specimen Description BLOOD LEFT ANTECUBITAL  Final   Special Requests   Final    BOTTLES DRAWN AEROBIC ONLY Blood Culture adequate volume   Culture  Setup Time   Final    YEAST AEROBIC BOTTLE ONLY CRITICAL VALUE NOTED.  VALUE IS CONSISTENT WITH PREVIOUSLY REPORTED AND CALLED VALUE.    Culture CRYPTOCOCCUS NEOFORMANS (A)  Final   Report Status 12/26/2016 FINAL  Final  Blood culture (routine x 2)     Status: Abnormal   Collection Time: 12/21/16  4:11 AM  Result Value Ref Range Status   Specimen Description BLOOD RIGHT HAND  Final   Special Requests  IN PEDIATRIC BOTTLE Blood Culture adequate volume  Final   Culture  Setup Time   Final    IN PEDIATRIC BOTTLE YEAST CRITICAL RESULT CALLED TO, READ BACK BY AND VERIFIED WITH: G.HIBBETT PHARMD, [id number] 12/24/16 M.CAMPBELL    Culture CRYPTOCOCCUS NEOFORMANS (A)  Final   Report Status 12/26/2016 FINAL  Final  Blood Culture ID Panel (Reflexed)     Status: None   Collection Time: 12/21/16  4:11 AM  Result Value Ref Range Status   Enterococcus species NOT DETECTED NOT DETECTED Final   Listeria monocytogenes NOT DETECTED NOT DETECTED Final   Staphylococcus  species NOT DETECTED NOT DETECTED Final   Staphylococcus aureus NOT DETECTED NOT DETECTED Final   Streptococcus species NOT DETECTED NOT DETECTED Final   Streptococcus agalactiae NOT DETECTED NOT DETECTED Final   Streptococcus pneumoniae NOT DETECTED NOT DETECTED Final   Streptococcus pyogenes NOT DETECTED NOT DETECTED Final   Acinetobacter baumannii NOT DETECTED NOT DETECTED Final   Enterobacteriaceae species NOT DETECTED NOT DETECTED Final   Enterobacter cloacae complex NOT DETECTED NOT DETECTED Final   Escherichia coli NOT DETECTED NOT DETECTED Final   Klebsiella oxytoca NOT DETECTED NOT DETECTED Final   Klebsiella pneumoniae NOT DETECTED NOT DETECTED Final   Proteus species NOT DETECTED NOT DETECTED Final   Serratia marcescens NOT DETECTED NOT DETECTED Final   Haemophilus influenzae NOT DETECTED NOT DETECTED Final   Neisseria meningitidis NOT DETECTED NOT DETECTED Final   Pseudomonas aeruginosa NOT DETECTED NOT DETECTED Final   Candida albicans NOT DETECTED NOT DETECTED Final   Candida glabrata NOT DETECTED NOT DETECTED Final   Candida krusei NOT DETECTED NOT DETECTED Final   Candida parapsilosis NOT DETECTED NOT DETECTED Final   Candida tropicalis NOT DETECTED NOT DETECTED Final  Aerobic/Anaerobic Culture (surgical/deep wound)     Status: Abnormal (Preliminary result)   Collection Time: 12/21/16  1:39 PM  Result Value Ref Range  Status   Specimen Description ABSCESS  Final   Special Requests   Final    PERIANAL PATIENT ON FOLLOWING  SEPTRA DS AND ZITHROMAX   Gram Stain   Final    FEW WBC PRESENT,BOTH PMN AND MONONUCLEAR FEW GRAM VARIABLE ROD    Culture (A)  Final    MULTIPLE ORGANISMS PRESENT, NONE PREDOMINANT HOLDING FOR POSSIBLE ANAEROBE    Report Status PENDING  Incomplete  CSF culture with Stat gram stain     Status: None   Collection Time: 12/22/16  9:29 AM  Result Value Ref Range Status   Specimen Description BACK  Final   Special Requests Immunocompromised  Final   Gram Stain   Final    CYTOSPIN SMEAR WBC PRESENT, PREDOMINANTLY MONONUCLEAR YEAST CRITICAL RESULT CALLED TO, READ BACK BY AND VERIFIED WITH: RN Morton Peters [id number] MLM    Culture ABUNDANT CRYPTOCOCCUS NEOFORMANS  Final   Report Status 12/25/2016 FINAL  Final  Culture, fungus without smear     Status: Abnormal (Preliminary result)   Collection Time: 12/22/16  9:51 AM  Result Value Ref Range Status   Specimen Description CSF  Final   Special Requests Immunocompromised  Final   Culture CRYPTOCOCCUS NEOFORMANS (A)  Final   Report Status PENDING  Incomplete  CSF culture with Stat gram stain     Status: None (Preliminary result)   Collection Time: 12/25/16  5:04 PM  Result Value Ref Range Status   Specimen Description CSF  Final   Special Requests Immunocompromised  Final   Gram Stain   Final    WBC PRESENT,BOTH PMN AND MONONUCLEAR BUDDING YEAST SEEN CYTOSPIN SMEAR CRITICAL RESULT CALLED TO, READ BACK BY AND VERIFIED WITH: L TURNER,RN AT 1821 12/25/16 BY L BENFIELD    Culture TOO YOUNG TO READ  Final   Report Status PENDING  Incomplete    Radiology Reports Dg Chest 2 View  Result Date: 12/20/2016 CLINICAL DATA:  Abdominal pain with nausea and vomiting and shortness of breath EXAM: CHEST  2 VIEW COMPARISON:  None. FINDINGS: The heart size and mediastinal contours are within normal limits. Both lungs are clear. The visualized  skeletal structures are unremarkable. IMPRESSION: No  active cardiopulmonary disease. Electronically Signed   By: Inez Catalina M.D.   On: 12/20/2016 07:48   Ct Head Wo Contrast  Result Date: 12/23/2016 CLINICAL DATA:  Mental status changes EXAM: CT HEAD WITHOUT CONTRAST TECHNIQUE: Contiguous axial images were obtained from the base of the skull through the vertex without intravenous contrast. COMPARISON:  None. FINDINGS: Brain: No evidence of acute infarction, hemorrhage, hydrocephalus, extra-axial collection or mass lesion/mass effect. Vascular: No hyperdense vessel or unexpected calcification. Skull: Normal. Negative for fracture or focal lesion. Sinuses/Orbits: No acute finding. Other: None. IMPRESSION: Normal brain. Electronically Signed   By: Kerby Moors M.D.   On: 12/23/2016 12:11   Ct Head Wo Contrast  Result Date: 12/21/2016 CLINICAL DATA:  HIV. Concern for disseminated infection or meningitis. Lymphadenopathy. Nuchal rigidity. EXAM: CT HEAD WITHOUT CONTRAST TECHNIQUE: Contiguous axial images were obtained from the base of the skull through the vertex without intravenous contrast. COMPARISON:  Head CT 12/17/2016 FINDINGS: Brain: No mass lesion, intraparenchymal hemorrhage or extra-axial collection. No evidence of acute cortical infarct. Brain parenchyma and CSF-containing spaces are normal for age. Vascular: No hyperdense vessel or unexpected calcification. Skull: Normal visualized skull base, calvarium and extracranial soft tissues. Sinuses/Orbits: No sinus fluid levels or advanced mucosal thickening. No mastoid effusion. Normal orbits. IMPRESSION: Normal head CT. Electronically Signed   By: Ulyses Jarred M.D.   On: 12/21/2016 19:06   Ct Head Wo Contrast  Result Date: 12/17/2016 CLINICAL DATA:  Headache EXAM: CT HEAD WITHOUT CONTRAST TECHNIQUE: Contiguous axial images were obtained from the base of the skull through the vertex without intravenous contrast. COMPARISON:  None. FINDINGS: Brain:  No evidence of acute infarction, hemorrhage, hydrocephalus, extra-axial collection or mass lesion/mass effect. Vascular: No hyperdense vessel or unexpected calcification. Skull: Normal. Negative for fracture or focal lesion. Sinuses/Orbits: No acute finding. Other: None IMPRESSION: No acute intracranial abnormality. Electronically Signed   By: Ashley Royalty M.D.   On: 12/17/2016 21:58   Ct Chest Wo Contrast  Result Date: 12/21/2016 CLINICAL DATA:  Evaluate for lymph node enlargement. 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 31 year old with HIV. EXAM: CT CHEST WITHOUT CONTRAST TECHNIQUE: Multidetector CT imaging of the chest was performed following the standard protocol without IV contrast. COMPARISON:  Chest radiograph 12/20/2016 FINDINGS: Cardiovascular: Normal caliber of the thoracic aorta. No gross abnormality to the pulmonary arteries. Limited evaluation of the vascular structures without intravenous contrast. Mediastinum/Nodes: Limited evaluation for mediastinal and hilar lymphadenopathy on this noncontrast examination. There appears to be subcarinal fullness measuring up to 1.8 cm which probably represents enlarged lymph nodes. Soft tissue fullness in the right hilum on sequence 4, image 75 measures 1.6 cm in the short axis. Multiple small lymph nodes scattered throughout the bilateral axillary regions. There appears to be multiple lymph nodes in the paratracheal region. There is low density in the anterior mediastinum which is nonspecific but could be associated with thymus. Cannot exclude right supraclavicular lymphadenopathy. Lungs/Pleura: There is trace right pleural fluid versus right pleural thickening. Trachea and mainstem bronchi are patent. Patchy areas of airspace disease in both lower lobes and both upper lobes. Small cavitary lesion in the right upper lobe measuring 6 mm on sequence 6, image 47. Scattered areas of interstitial thickening in the right lung. Upper Abdomen: Images of the upper abdomen are  unremarkable. Musculoskeletal: No acute bone abnormality. IMPRESSION: Bilateral airspace disease with a small cavitary lesion in the right upper lobe. Findings compatible with pneumonia. Trace right pleural fluid versus right pleural thickening. Mediastinal and hilar lymphadenopathy. Evaluation  of the lymphadenopathy is limited on this noncontrast examination. There are also prominent bilateral axillary lymph nodes. Electronically Signed   By: Markus Daft M.D.   On: 12/21/2016 21:15   Ct Abdomen Pelvis W Contrast  Result Date: 12/21/2016 CLINICAL DATA:  Known rectal abscesses, status post incision and drainage. Personal history of HIV. Initial encounter. EXAM: CT ABDOMEN AND PELVIS WITH CONTRAST TECHNIQUE: Multidetector CT imaging of the abdomen and pelvis was performed using the standard protocol following bolus administration of intravenous contrast. CONTRAST:  150m ISOVUE-300 IOPAMIDOL (ISOVUE-300) INJECTION 61% COMPARISON:  CT of the abdomen and pelvis performed 12/17/2016 FINDINGS: Lower chest: Mild right basilar airspace opacity raises concern for pneumonia. A 1.6 cm azygoesophageal recess node is seen. Hepatobiliary: The liver is unremarkable in appearance. A stone is noted dependently within the gallbladder. The gallbladder is otherwise unremarkable. The common bile duct remains normal in caliber. Pancreas: The pancreas is within normal limits. Spleen: The spleen is unremarkable in appearance. Adrenals/Urinary Tract: The adrenal glands are unremarkable in appearance. The kidneys are within normal limits. There is no evidence of hydronephrosis. No renal or ureteral stones are identified. No perinephric stranding is seen. Stomach/Bowel: The stomach is unremarkable in appearance. The small bowel is within normal limits. The patient is status post appendectomy. Mild wall thickening is noted along the distal sigmoid colon and rectum, raising concern for proctitis. The remainder of the colon is filled with  contrast and is unremarkable in appearance. Vascular/Lymphatic: The abdominal aorta is unremarkable in appearance. The inferior vena cava is grossly unremarkable. Prominent retroperitoneal nodes are seen, measuring up to 1.1 cm in short axis. Prominent nodes are also noted at both sides of the pelvis, measuring up to 1.7 cm in short axis. Enlarged bilateral inguinal nodes are seen, measuring up to 1.6 cm in short axis. Reproductive: The bladder is mildly distended and contains trace contrast. The prostate remains normal in size. Other: Trace ascites is noted about the right side of the abdomen. Diffuse presacral soft tissue inflammation is again noted. There is diffuse soft tissue edema tracking about the anorectal canal, without definite evidence of abscess. Edema is seen tracking along the right side of the gluteal cleft, similar in appearance to the prior study. This may reflect a persistent subcutaneous abscess, given stability from the prior study, though not well assessed on delayed images. A small drainage catheter is noted posterior to the anorectal canal. Scrotal wall edema is noted, worse on the right, with small bilateral hydroceles. Musculoskeletal: No acute osseous abnormalities are identified. The visualized musculature is unremarkable in appearance. IMPRESSION: 1. Edema tracking along the right side of the gluteal cleft is similar in appearance to the prior study. This reflected a subcutaneous abscess on the prior study. This most likely reflects a persistent small subcutaneous abscess. 2. Diffuse soft tissue edema tracking about the anorectal canal, without additional abscess. Diffuse presacral soft tissue inflammation noted. 3. Scrotal wall edema, worse on the right, with small bilateral hydroceles. 4. Mild right basilar airspace opacity raises concern for pneumonia. 5. Mild wall thickening along the distal sigmoid colon and rectum, raising concern for proctitis. 6. Enlarged azygoesophageal recess,  retroperitoneal, pelvic sidewall and bilateral inguinal nodes seen, measuring up to 1.7 cm in short axis. This likely reflects the underlying infection. 7. Cholelithiasis.  Gallbladder otherwise unremarkable. Electronically Signed   By: JGarald BaldingM.D.   On: 12/21/2016 01:29   Ct Abdomen Pelvis W Contrast  Result Date: 12/17/2016 CLINICAL DATA:  Known rectal abscesses. Frequent  vomiting past 4 days. Hypotension earlier today. HIV positive. EXAM: CT ABDOMEN AND PELVIS WITH CONTRAST TECHNIQUE: Multidetector CT imaging of the abdomen and pelvis was performed using the standard protocol following bolus administration of intravenous contrast. CONTRAST:  <See Chart> ISOVUE-300 IOPAMIDOL (ISOVUE-300) INJECTION 61% COMPARISON:  10/31/2016 FINDINGS: Lower chest: Lung bases are within normal. Hepatobiliary: Single punctate gallstone. Liver and biliary tree are within normal. Pancreas: Within normal. Spleen: Within normal. Adrenals/Urinary Tract: Adrenal glands are normal. Kidneys are normal in size without hydronephrosis or nephrolithiasis. Ureters and bladder are normal. Stomach/Bowel: Stomach and small bowel are within normal. Appendix is within normal. Colon is unremarkable. Vascular/Lymphatic: Vascular structures are within normal. Mildly prominent inguinal lymph nodes unchanged. Reproductive: Unremarkable. Other: Slight interval decrease in size of a medial right gluteal subcutaneous abscess measuring 1 x 3.3 cm (previously 1.5 x 3.8 cm). Small caliber drain projects very superficial lesion over the lower gluteal crease just above this subcutaneous abscess. Mild wall thickening of the rectum with small round enhancing right perirectal abscess measuring 0.8 x 1.6 cm. Subtle perirectal inflammation. Few small perirectal lymph nodes are present. Slight worsening external iliac chain adenopathy with the largest node over the right iliac chain measuring 1.6 x 3.3 cm. Musculoskeletal: Within normal. IMPRESSION:  Persistent rectal wall thickening and perirectal inflammation with new small right perirectal abscess measuring 0.8 x 1.6 cm. Interval decrease in size of a medial right gluteal subcutaneous abscess measuring 1 x 3.3 cm (previously 1.5 x 3.8 cm). Bilateral inguinal and iliac chain/ pelvic adenopathy slightly worse. Findings likely related to patient's ongoing infection described above as well as known HIV. Minimal cholelithiasis unchanged. These results were called by telephone at the time of interpretation on 12/17/2016 at 10:13 pm to Dr. Quintella Reichert , who verbally acknowledged these results. Electronically Signed   By: Marin Olp M.D.   On: 12/17/2016 22:14   Dg Chest Port 1 View  Result Date: 12/23/2016 CLINICAL DATA:  Generalized body aches 1 week. Chest pain. Short of breath. HIV positive. EXAM: PORTABLE CHEST 1 VIEW COMPARISON:  12/20/2016 FINDINGS: The heart is borderline enlarged. Central and basilar hazy opacities of developed. No pneumothorax. There is new prominence of the hilar regions. IMPRESSION: New hilar prominence and bilateral hazy airspace opacities compared with 3 days ago. Differential diagnosis includes new pulmonary edema with increased size of the hilar lymph nodes or an inflammatory process. Opportunistic infection should be considered. Electronically Signed   By: Marybelle Killings M.D.   On: 12/23/2016 09:44     ELGERGAWY, DAWOOD M.D on 12/26/2016 at 11:14 AM  Between 7am to 7pm - Pager - [phone]  After 7pm go to [email or web address] - password [facility]  [facility] -  Office  [phone]

## 2016-12-26 NOTE — Procedures (Signed)
Bronchoscopy Procedure Note Gaberiel Youngblood [id number] [birth year]  Procedure: Bronchoscopy Indications: Obtain specimens for culture and/or other diagnostic studies  Procedure Details Consent: Unable to obtain consent because of altered level of consciousness. Time Out: Verified patient identification, verified procedure, site/side was marked, verified correct patient position, special equipment/implants available, medications/allergies/relevent history reviewed, required imaging and test results available.  Performed  31 yo male with HIV/AIDS and pulmonary infiltrates.  Intubated prior to procedure.  Placed on 100% FiO2.  Bronchoscope entered through the ETT and carina visualized.    Directed to left main bronchus.  Visualized Lt upper, lingular, and lower lobes.  Mucosa normal and no endobronchial lesions.  Directed to right main bronchus.  Visualized Rt upper, middle, and lower lobes.  Mucosa normal and no endobronchial lesions.  Wedged bronchoscope into right middle lobe.  Instilled 20 ml of saline for BAL with 10 ml of clear fluid returned.  Plan Will send BAL for culture, AFB, fungal culture, cytology.  Chesley Mires, MD [facility] Pulmonary/Critical Care 12/26/2016, 7:17 PM Pager:  [phone] After 3pm call: [phone]

## 2016-12-26 NOTE — Progress Notes (Signed)
CRITICAL VALUE ALERT  Critical Value:  Hgb 6.9  Date & Time Notied:  12/26/16 @ 0345  Provider Notified:  D. Olevia Bowens  Orders Received/Actions taken:  See EPIC for orders

## 2016-12-26 NOTE — Progress Notes (Signed)
Received call from Dr Jacolyn Reedy in regards to patient's situation We discussed the pt's current status including he need for multiple LP  [name] reportedly neuro intact otherwise, just has headaches I have reviewed the case with Dr Kathyrn Sheriff No indication for Lumbar drain yet. Rec continued LP as needed Will consider Lumbar drain should elevated pressure remain Call for any concerns

## 2016-12-27 ENCOUNTER — Inpatient Hospital Stay (HOSPITAL_COMMUNITY): Payer: Medicaid Other | Admitting: Anesthesiology

## 2016-12-27 ENCOUNTER — Inpatient Hospital Stay (HOSPITAL_COMMUNITY): Payer: Medicaid Other

## 2016-12-27 ENCOUNTER — Encounter (HOSPITAL_COMMUNITY)
Admission: EM | Disposition: A | Discharge status: Skilled Nursing Facility | Payer: Self-pay | Source: Home / Self Care | Attending: Emergency Medicine

## 2016-12-27 DIAGNOSIS — R402434 Glasgow coma scale score 3-8, 24 hours or more after hospital admission: Secondary | ICD-10-CM

## 2016-12-27 DIAGNOSIS — J9600 Acute respiratory failure, unspecified whether with hypoxia or hypercapnia: Secondary | ICD-10-CM

## 2016-12-27 HISTORY — PX: PLACEMENT OF LUMBAR DRAIN: SHX6028

## 2016-12-27 LAB — CBC
HCT: 29.3 % — ABNORMAL LOW (ref 39.0–52.0)
HEMOGLOBIN: 9.7 g/dL — AB (ref 13.0–17.0)
MCH: 27 pg (ref 26.0–34.0)
MCHC: 33.1 g/dL (ref 30.0–36.0)
MCV: 81.6 fL (ref 78.0–100.0)
PLATELETS: 146 10*3/uL — AB (ref 150–400)
RBC: 3.59 MIL/uL — ABNORMAL LOW (ref 4.22–5.81)
RDW: 15 % (ref 11.5–15.5)
WBC: 9.7 10*3/uL (ref 4.0–10.5)

## 2016-12-27 LAB — TYPE AND SCREEN
ABO/RH(D): O POS
Antibody Screen: NEGATIVE
UNIT DIVISION: 0
Unit division: 0

## 2016-12-27 LAB — MAGNESIUM
MAGNESIUM: 1.5 mg/dL — AB (ref 1.7–2.4)
Magnesium: 3.1 mg/dL — ABNORMAL HIGH (ref 1.7–2.4)

## 2016-12-27 LAB — BASIC METABOLIC PANEL
Anion gap: 8 (ref 5–15)
BUN: <5 mg/dL — ABNORMAL LOW (ref 6–20)
CALCIUM: 8.4 mg/dL — AB (ref 8.9–10.3)
CO2: 19 mmol/L — ABNORMAL LOW (ref 22–32)
CREATININE: 0.99 mg/dL (ref 0.61–1.24)
Chloride: 110 mmol/L (ref 101–111)
GFR calc Af Amer: >60 mL/min (ref >60)
GLUCOSE: 108 mg/dL — AB (ref 65–99)
Potassium: 3.7 mmol/L (ref 3.5–5.1)
Sodium: 137 mmol/L (ref 135–145)

## 2016-12-27 LAB — GLUCOSE, CAPILLARY
GLUCOSE-CAPILLARY: 135 mg/dL — AB (ref 65–99)
GLUCOSE-CAPILLARY: 93 mg/dL (ref 65–99)
Glucose-Capillary: 101 mg/dL — ABNORMAL HIGH (ref 65–99)
Glucose-Capillary: 107 mg/dL — ABNORMAL HIGH (ref 65–99)
Glucose-Capillary: 129 mg/dL — ABNORMAL HIGH (ref 65–99)
Glucose-Capillary: 138 mg/dL — ABNORMAL HIGH (ref 65–99)
Glucose-Capillary: 142 mg/dL — ABNORMAL HIGH (ref 65–99)

## 2016-12-27 LAB — BPAM RBC
BLOOD PRODUCT EXPIRATION DATE: 201806042359
Blood Product Expiration Date: 201806022359
ISSUE DATE / TIME: 201805280903
ISSUE DATE / TIME: 201805281250
UNIT TYPE AND RH: 5100
Unit Type and Rh: 9500

## 2016-12-27 LAB — HEPATIC FUNCTION PANEL
ALT: 57 U/L (ref 17–63)
AST: 36 U/L (ref 15–41)
Albumin: 2.3 g/dL — ABNORMAL LOW (ref 3.5–5.0)
Alkaline Phosphatase: 144 U/L — ABNORMAL HIGH (ref 38–126)
BILIRUBIN DIRECT: 0.2 mg/dL (ref 0.1–0.5)
BILIRUBIN INDIRECT: 0.5 mg/dL (ref 0.3–0.9)
BILIRUBIN TOTAL: 0.7 mg/dL (ref 0.3–1.2)
Total Protein: 8.5 g/dL — ABNORMAL HIGH (ref 6.5–8.1)

## 2016-12-27 LAB — AEROBIC/ANAEROBIC CULTURE W GRAM STAIN (SURGICAL/DEEP WOUND)

## 2016-12-27 LAB — AEROBIC/ANAEROBIC CULTURE (SURGICAL/DEEP WOUND)

## 2016-12-27 LAB — PNEUMOCYSTIS JIROVECI SMEAR BY DFA: Pneumocystis jiroveci Ag: NEGATIVE

## 2016-12-27 LAB — ACID FAST SMEAR (AFB)

## 2016-12-27 LAB — PATHOLOGIST SMEAR REVIEW

## 2016-12-27 LAB — ACID FAST SMEAR (AFB, MYCOBACTERIA): Acid Fast Smear: NEGATIVE

## 2016-12-27 LAB — PHOSPHORUS: Phosphorus: 5.1 mg/dL — ABNORMAL HIGH (ref 2.5–4.6)

## 2016-12-27 SURGERY — PLACEMENT OF LUMBAR DRAIN
Anesthesia: General | Site: Spine Lumbar

## 2016-12-27 MED ORDER — 0.9 % SODIUM CHLORIDE (POUR BTL) OPTIME
TOPICAL | Status: DC | PRN
  Administered 2016-12-27: 1000 mL

## 2016-12-27 MED ORDER — PROPOFOL 10 MG/ML IV BOLUS
INTRAVENOUS | Status: AC
  Filled 2016-12-27: qty 20

## 2016-12-27 MED ORDER — ROCURONIUM BROMIDE 100 MG/10ML IV SOLN
INTRAVENOUS | Status: DC | PRN
  Administered 2016-12-27: 30 mg via INTRAVENOUS

## 2016-12-27 MED ORDER — LIDOCAINE-EPINEPHRINE 1 %-1:100000 IJ SOLN
INTRAMUSCULAR | Status: DC | PRN
  Administered 2016-12-27: 3 mL

## 2016-12-27 MED ORDER — VITAL AF 1.2 CAL PO LIQD
1000.0000 mL | ORAL | Status: DC
  Administered 2016-12-27 – 2017-01-03 (×9): 1000 mL
  Filled 2016-12-27 (×6): qty 1000

## 2016-12-27 MED ORDER — FENTANYL CITRATE (PF) 250 MCG/5ML IJ SOLN
INTRAMUSCULAR | Status: AC
  Filled 2016-12-27: qty 5

## 2016-12-27 MED ORDER — BUPIVACAINE HCL (PF) 0.5 % IJ SOLN
INTRAMUSCULAR | Status: DC | PRN
  Administered 2016-12-27: 3 mL

## 2016-12-27 MED ORDER — IOPAMIDOL (ISOVUE-300) INJECTION 61%
INTRAVENOUS | Status: AC
  Filled 2016-12-27: qty 50

## 2016-12-27 MED ORDER — LIDOCAINE-EPINEPHRINE 1 %-1:100000 IJ SOLN
INTRAMUSCULAR | Status: AC
  Filled 2016-12-27: qty 1

## 2016-12-27 MED ORDER — MIDAZOLAM HCL 2 MG/2ML IJ SOLN
INTRAMUSCULAR | Status: DC | PRN
  Administered 2016-12-27: 2 mg via INTRAVENOUS

## 2016-12-27 MED ORDER — POTASSIUM CHLORIDE 20 MEQ/15ML (10%) PO SOLN
40.0000 meq | Freq: Once | ORAL | Status: AC
  Administered 2016-12-27: 40 meq
  Filled 2016-12-27: qty 30

## 2016-12-27 MED ORDER — MIDAZOLAM HCL 2 MG/2ML IJ SOLN
INTRAMUSCULAR | Status: AC
  Filled 2016-12-27: qty 2

## 2016-12-27 MED ORDER — GADOBENATE DIMEGLUMINE 529 MG/ML IV SOLN
15.0000 mL | Freq: Once | INTRAVENOUS | Status: AC | PRN
  Administered 2016-12-27: 13 mL via INTRAVENOUS

## 2016-12-27 MED ORDER — LACTATED RINGERS IV SOLN
INTRAVENOUS | Status: DC | PRN
  Administered 2016-12-27: 09:00:00 via INTRAVENOUS

## 2016-12-27 MED ORDER — BUPIVACAINE HCL (PF) 0.5 % IJ SOLN
INTRAMUSCULAR | Status: AC
  Filled 2016-12-27: qty 30

## 2016-12-27 MED ORDER — MAGNESIUM SULFATE 4 GM/100ML IV SOLN
4.0000 g | Freq: Once | INTRAVENOUS | Status: AC
  Administered 2016-12-27: 4 g via INTRAVENOUS
  Filled 2016-12-27: qty 100

## 2016-12-27 MED ORDER — PRO-STAT SUGAR FREE PO LIQD: 30.0000 mL | Freq: Two times a day (BID) | ORAL | Status: DC

## 2016-12-27 MED ORDER — CHLORHEXIDINE GLUCONATE 0.12% ORAL RINSE (MEDLINE KIT)
15.0000 mL | Freq: Two times a day (BID) | OROMUCOSAL | Status: DC
  Administered 2016-12-27 – 2017-01-05 (×19): 15 mL via OROMUCOSAL

## 2016-12-27 MED ORDER — ORAL CARE MOUTH RINSE
15.0000 mL | OROMUCOSAL | Status: DC
  Administered 2016-12-27 – 2017-01-04 (×78): 15 mL via OROMUCOSAL

## 2016-12-27 MED ORDER — VITAL HIGH PROTEIN PO LIQD: 1000.0000 mL | ORAL | Status: DC

## 2016-12-27 SURGICAL SUPPLY — 50 items
BLADE 11 SAFETY STRL DISP (BLADE) ×3 IMPLANT
BLADE CLIPPER SURG (BLADE) IMPLANT
BLADE SURG 15 STRL LF DISP TIS (BLADE) ×1 IMPLANT
BLADE SURG 15 STRL SS (BLADE) ×2
CARTRIDGE OIL MAESTRO DRILL (MISCELLANEOUS) IMPLANT
DERMABOND ADVANCED (GAUZE/BANDAGES/DRESSINGS)
DERMABOND ADVANCED .7 DNX12 (GAUZE/BANDAGES/DRESSINGS) IMPLANT
DIFFUSER DRILL AIR PNEUMATIC (MISCELLANEOUS) IMPLANT
DRAIN SUBARACHNOID (WOUND CARE) ×3 IMPLANT
DRAPE C-ARM 42X72 X-RAY (DRAPES) ×3 IMPLANT
DRAPE HALF SHEET 40X57 (DRAPES) IMPLANT
DRAPE LAPAROTOMY 100X72X124 (DRAPES) ×3 IMPLANT
DRAPE SURG 17X23 STRL (DRAPES) ×3 IMPLANT
DRSG TEGADERM 4X4.75 (GAUZE/BANDAGES/DRESSINGS) ×3 IMPLANT
DURAPREP 26ML APPLICATOR (WOUND CARE) ×3 IMPLANT
GAUZE SPONGE 4X4 16PLY XRAY LF (GAUZE/BANDAGES/DRESSINGS) IMPLANT
GLOVE BIO SURGEON STRL SZ7 (GLOVE) IMPLANT
GLOVE BIOGEL PI IND STRL 6.5 (GLOVE) ×2 IMPLANT
GLOVE BIOGEL PI IND STRL 7.0 (GLOVE) IMPLANT
GLOVE BIOGEL PI IND STRL 7.5 (GLOVE) ×2 IMPLANT
GLOVE BIOGEL PI INDICATOR 6.5 (GLOVE) ×4
GLOVE BIOGEL PI INDICATOR 7.0 (GLOVE)
GLOVE BIOGEL PI INDICATOR 7.5 (GLOVE) ×4
GLOVE ECLIPSE 7.0 STRL STRAW (GLOVE) ×3 IMPLANT
GLOVE EXAM NITRILE LRG STRL (GLOVE) IMPLANT
GLOVE EXAM NITRILE XL STR (GLOVE) IMPLANT
GLOVE EXAM NITRILE XS STR PU (GLOVE) IMPLANT
GLOVE SURG SS PI 6.5 STRL IVOR (GLOVE) ×6 IMPLANT
GOWN STRL REUS W/ TWL LRG LVL3 (GOWN DISPOSABLE) ×3 IMPLANT
GOWN STRL REUS W/ TWL XL LVL3 (GOWN DISPOSABLE) IMPLANT
GOWN STRL REUS W/TWL 2XL LVL3 (GOWN DISPOSABLE) IMPLANT
GOWN STRL REUS W/TWL LRG LVL3 (GOWN DISPOSABLE) ×6
GOWN STRL REUS W/TWL XL LVL3 (GOWN DISPOSABLE)
HEMOSTAT POWDER SURGIFOAM 1G (HEMOSTASIS) IMPLANT
KIT BASIN OR (CUSTOM PROCEDURE TRAY) ×3 IMPLANT
KIT DRAIN CSF ACCUDRAIN (MISCELLANEOUS) ×3 IMPLANT
KIT ROOM TURNOVER OR (KITS) ×3 IMPLANT
NEEDLE HYPO 25X1 1.5 SAFETY (NEEDLE) ×3 IMPLANT
NEEDLE SPNL 18GX3.5 QUINCKE PK (NEEDLE) IMPLANT
NS IRRIG 1000ML POUR BTL (IV SOLUTION) ×3 IMPLANT
OIL CARTRIDGE MAESTRO DRILL (MISCELLANEOUS)
PACK SURGICAL SETUP 50X90 (CUSTOM PROCEDURE TRAY) IMPLANT
PAD ARMBOARD 7.5X6 YLW CONV (MISCELLANEOUS) ×15 IMPLANT
SPECIMEN JAR SMALL (MISCELLANEOUS) IMPLANT
SPONGE SURGIFOAM ABS GEL SZ50 (HEMOSTASIS) IMPLANT
SUT VICRYL 3-0 RB1 18 ABS (SUTURE) ×3 IMPLANT
SWABSTICK BENZOIN STERILE (MISCELLANEOUS) ×3 IMPLANT
SYR CONTROL 10ML LL (SYRINGE) IMPLANT
TOWEL GREEN STERILE (TOWEL DISPOSABLE) ×3 IMPLANT
TOWEL GREEN STERILE FF (TOWEL DISPOSABLE) ×2 IMPLANT

## 2016-12-27 NOTE — Progress Notes (Signed)
Pt transported to MRI with RN, RT Dub Mikes), and transporter Jefm Miles). Pt returned to 9G10 without complication, vital signs stable. Will continue to monitor pt closely.

## 2016-12-27 NOTE — Progress Notes (Signed)
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  He was treated at [facility] with surgery in January by Dr. Ronita Hipps, but now is incarcerated in [place].  He has had multiple different types of drains, but currently just has a draining seton in place at the site of a fistula.  He has been to the ED 3 times in the last week.  Due to his current incarceration in [facility], he cannot be transferred to [place].     Pt s/p procedures 5/28, 5/29: LUMBAR PUNCTURE, H. Rivera Colon  Patient is currently intubated on ventilator support MV: 10 L/min Temp (24hrs), Avg:99 F (37.2 C), Min:98 F (36.7 C), Max:101.1 F (38.4 C)  OGT in place   Pt with advanced HIV/AIDS, admitted for perirectal abscess.   Noted to have fever, night sweats, lymphadenopathy and stiff neck.   ID consulted.  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  Labs and medications reviewed.  Magnesium 1.5 (L). CBG's [id number].  Unable to complete Nutrition-Focused physical exam at this time.   Diet Order:  Diet NPO time specified  Skin:  Reviewed, no issues  Last BM:  5/29  Height:   Ht Readings from Last 1 Encounters:  12/27/16 5\' 6"  (1.676 m)   Weight:   Wt Readings from Last 1 Encounters:  12/21/16 141 lb 8 oz (64.2 kg)   Ideal Body Weight:  59 kg  BMI:  Body mass index is 22.84 kg/m.  Estimated  Nutritional Needs:   Kcal:  1985  Protein:  120-130 gm  Fluid:  per MD  EDUCATION NEEDS:   No education needs identified at this time  Arthur Holms, RD, LDN Pager #: [phone] After-Hours Pager #: [phone]

## 2016-12-27 NOTE — Progress Notes (Addendum)
Subjective: Sedated and intubated.  Objective: Current vital signs: BP (!) 153/99   Pulse 87   Temp 98.7 F (37.1 C) (Oral)   Resp (!) 24   Ht 5' 6" (1.676 m)   Wt 64.2 kg (141 lb 8 oz)   SpO2 97%   BMI 22.84 kg/m  Vital signs in last 24 hours: Temp:  [97.3 F (36.3 C)-98.7 F (37.1 C)] 98.7 F (37.1 C) (05/29 0400) Pulse Rate:  [47-87] 87 (05/29 0700) Resp:  [16-24] 24 (05/29 0700) BP: (113-171)/(75-112) 153/99 (05/29 0700) SpO2:  [94 %-100 %] 97 % (05/29 0700) FiO2 (%):  [50 %-100 %] 50 % (05/29 0400)  Intake/Output from previous day: 05/28 0701 - 05/29 0700 In: 5491.4 [P.O.:1080; I.V.:1921.4; Blood:700; NG/GT:90; IV Piggyback:1700] Out: 4203 [Urine:3700; Emesis/NG output:500; Stool:3] Intake/Output this shift: No intake/output data recorded. Nutritional status: Diet NPO time specified  General: Skin: Noncyanotic, anicteric Ext: Warm and well-perfused  Neurologic Exam: Exam while sedated and intubated, prior to lumbar drain placement. Findings consistent with sedation, with unreactive pupils, no oculocephalic reflex, no eye movement, flaccidly symmetric facies, flaccid extremities x 4 without movement to noxious, depressed reflexes.   Lab Results: Results for orders placed or performed during the hospital encounter of 12/20/16 (from the past 48 hour(s))  CSF culture with Stat gram stain     Status: None (Preliminary result)   Collection Time: 12/25/16  5:04 PM  Result Value Ref Range   Specimen Description CSF    Special Requests Immunocompromised    Gram Stain      WBC PRESENT,BOTH PMN AND MONONUCLEAR BUDDING YEAST SEEN CYTOSPIN SMEAR CRITICAL RESULT CALLED TO, READ BACK BY AND VERIFIED WITH: L TURNER,RN AT 1821 12/25/16 BY L BENFIELD    Culture TOO YOUNG TO READ    Report Status PENDING   Culture, fungus without smear     Status: None (Preliminary result)   Collection Time: 12/25/16  5:05 PM  Result Value Ref Range   Specimen Description CSF    Special  Requests Immunocompromised    Culture NO FUNGUS ISOLATED AFTER 1 DAY    Report Status PENDING   Cryptococcal antigen, CSF     Status: Abnormal   Collection Time: 12/25/16  5:09 PM  Result Value Ref Range   Crypto Ag POSITIVE (A) NEGATIVE   Cryptococcal Ag Titer >2,560 (A) NOT INDICATED    Comment: CRITICAL RESULT CALLED TO, READ BACK BY AND VERIFIED WITH: TO LTURNER(RN) BY LBENFIELD AT 1821   CSF cell count with differential     Status: Abnormal   Collection Time: 12/25/16  5:09 PM  Result Value Ref Range   Tube # 4    Color, CSF COLORLESS COLORLESS   Appearance, CSF HAZY (A) CLEAR   Supernatant NOT INDICATED    RBC Count, CSF 9 (H) 0 /cu mm   WBC, CSF 29 (HH) 0 - 5 /cu mm    Comment: CRITICAL RESULT CALLED TO, READ BACK BY AND VERIFIED WITH: K.NELSON,RN 12/25/16 _0  BY V.WILKINS    Segmented Neutrophils-CSF 75 (H) 0 - 6 %   Lymphs, CSF 25 (L) 40 - 80 %   Other Cells, CSF FUNGAL ELEMENTS/YEAST SEEN   Protein and glucose, CSF     Status: Abnormal   Collection Time: 12/25/16  5:09 PM  Result Value Ref Range   Glucose, CSF 45 40 - 70 mg/dL   Total  Protein, CSF 122 (H) 15 - 45 mg/dL  Magnesium     Status: None  Collection Time: 12/26/16  2:30 AM  Result Value Ref Range   Magnesium 1.8 1.7 - 2.4 mg/dL  Basic metabolic panel     Status: Abnormal   Collection Time: 12/26/16  2:30 AM  Result Value Ref Range   Sodium 136 135 - 145 mmol/L   Potassium 3.3 (L) 3.5 - 5.1 mmol/L    Comment: DELTA CHECK NOTED   Chloride 112 (H) 101 - 111 mmol/L   CO2 19 (L) 22 - 32 mmol/L   Glucose, Bld 86 65 - 99 mg/dL   BUN <5 (L) 6 - 20 mg/dL   Creatinine, Ser 0.88 0.61 - 1.24 mg/dL   Calcium 8.0 (L) 8.9 - 10.3 mg/dL   GFR calc non Af Amer >60 >60 mL/min   GFR calc Af Amer >60 >60 mL/min    Comment: (NOTE) The eGFR has been calculated using the CKD EPI equation. This calculation has not been validated in all clinical situations. eGFR's persistently <60 mL/min signify possible Chronic  Kidney Disease.    Anion gap 5 5 - 15  CBC     Status: Abnormal   Collection Time: 12/26/16  2:30 AM  Result Value Ref Range   WBC 6.9 4.0 - 10.5 K/uL   RBC 2.73 (L) 4.22 - 5.81 MIL/uL   Hemoglobin 6.9 (LL) 13.0 - 17.0 g/dL    Comment: REPEATED TO VERIFY CRITICAL RESULT CALLED TO, READ BACK BY AND VERIFIED WITH: T.NELISON,RN 8546 12/26/16 M.CAMPBELL    HCT 22.3 (L) 39.0 - 52.0 %   MCV 81.7 78.0 - 100.0 fL   MCH 25.3 (L) 26.0 - 34.0 pg   MCHC 30.9 30.0 - 36.0 g/dL   RDW 15.4 11.5 - 15.5 %   Platelets 108 (L) 150 - 400 K/uL    Comment: CONSISTENT WITH PREVIOUS RESULT  Prepare RBC     Status: None   Collection Time: 12/26/16  7:05 AM  Result Value Ref Range   Order Confirmation ORDER PROCESSED BY BLOOD BANK   Type and screen [place]     Status: None   Collection Time: 12/26/16  7:05 AM  Result Value Ref Range   ABO/RH(D) O POS    Antibody Screen NEG    Sample Expiration 12/29/2016    Unit Number [id number]    Blood Component Type RED CELLS,LR    Unit division 00    Status of Unit ISSUED,FINAL    Transfusion Status OK TO TRANSFUSE    Crossmatch Result Compatible    Unit Number [id number]    Blood Component Type RED CELLS,LR    Unit division 00    Status of Unit ISSUED,FINAL    Transfusion Status OK TO TRANSFUSE    Crossmatch Result Compatible   ABO/Rh     Status: None   Collection Time: 12/26/16  7:05 AM  Result Value Ref Range   ABO/RH(D) O POS   Hemoglobin and hematocrit, blood     Status: Abnormal   Collection Time: 12/26/16  6:57 PM  Result Value Ref Range   Hemoglobin 9.7 (L) 13.0 - 17.0 g/dL    Comment: POST TRANSFUSION SPECIMEN   HCT 30.0 (L) 39.0 - 52.0 %  Triglycerides     Status: None   Collection Time: 12/26/16  7:02 PM  Result Value Ref Range   Triglycerides 144 <150 mg/dL  Culture, bal-quantitative     Status: None (Preliminary result)   Collection Time: 12/26/16  7:06 PM  Result Value Ref Range   Specimen  Description  BRONCHIAL ALVEOLAR LAVAGE    Special Requests Immunocompromised    Gram Stain      RARE WBC PRESENT, PREDOMINANTLY PMN NO ORGANISMS SEEN    Culture PENDING    Report Status PENDING   I-STAT 3, arterial blood gas (G3+)     Status: Abnormal   Collection Time: 12/26/16  8:28 PM  Result Value Ref Range   pH, Arterial 7.460 (H) 7.350 - 7.450   pCO2 arterial 31.8 (L) 32.0 - 48.0 mmHg   pO2, Arterial 348.0 (H) 83.0 - 108.0 mmHg   Bicarbonate 22.6 20.0 - 28.0 mmol/L   TCO2 24 0 - 100 mmol/L   O2 Saturation 100.0 %   Acid-base deficit 1.0 0.0 - 2.0 mmol/L   Patient temperature 98.6 F    Collection site RADIAL, ALLEN'S TEST ACCEPTABLE    Drawn by RT    Sample type ARTERIAL   Glucose, capillary     Status: Abnormal   Collection Time: 12/26/16  8:48 PM  Result Value Ref Range   Glucose-Capillary 129 (H) 65 - 99 mg/dL  Glucose, capillary     Status: None   Collection Time: 12/27/16 12:21 AM  Result Value Ref Range   Glucose-Capillary 93 65 - 99 mg/dL  Magnesium     Status: Abnormal   Collection Time: 12/27/16  5:24 AM  Result Value Ref Range   Magnesium 1.5 (L) 1.7 - 2.4 mg/dL  Basic metabolic panel     Status: Abnormal   Collection Time: 12/27/16  5:24 AM  Result Value Ref Range   Sodium 137 135 - 145 mmol/L   Potassium 3.7 3.5 - 5.1 mmol/L   Chloride 110 101 - 111 mmol/L   CO2 19 (L) 22 - 32 mmol/L   Glucose, Bld 108 (H) 65 - 99 mg/dL   BUN <5 (L) 6 - 20 mg/dL   Creatinine, Ser 0.99 0.61 - 1.24 mg/dL   Calcium 8.4 (L) 8.9 - 10.3 mg/dL   GFR calc non Af Amer >60 >60 mL/min   GFR calc Af Amer >60 >60 mL/min    Comment: (NOTE) The eGFR has been calculated using the CKD EPI equation. This calculation has not been validated in all clinical situations. eGFR's persistently <60 mL/min signify possible Chronic Kidney Disease.    Anion gap 8 5 - 15  Hepatic function panel     Status: Abnormal   Collection Time: 12/27/16  5:24 AM  Result Value Ref Range   Total Protein 8.5 (H) 6.5  - 8.1 g/dL   Albumin 2.3 (L) 3.5 - 5.0 g/dL   AST 36 15 - 41 U/L   ALT 57 17 - 63 U/L   Alkaline Phosphatase 144 (H) 38 - 126 U/L   Total Bilirubin 0.7 0.3 - 1.2 mg/dL   Bilirubin, Direct 0.2 0.1 - 0.5 mg/dL   Indirect Bilirubin 0.5 0.3 - 0.9 mg/dL  CBC     Status: Abnormal   Collection Time: 12/27/16  5:24 AM  Result Value Ref Range   WBC 9.7 4.0 - 10.5 K/uL   RBC 3.59 (L) 4.22 - 5.81 MIL/uL   Hemoglobin 9.7 (L) 13.0 - 17.0 g/dL   HCT 29.3 (L) 39.0 - 52.0 %   MCV 81.6 78.0 - 100.0 fL   MCH 27.0 26.0 - 34.0 pg   MCHC 33.1 30.0 - 36.0 g/dL   RDW 15.0 11.5 - 15.5 %   Platelets 146 (L) 150 - 400 K/uL    Recent Results (from the  past 240 hour(s))  Culture, blood (Routine X 2) w Reflex to ID Panel     Status: Abnormal   Collection Time: 12/20/16  6:42 AM  Result Value Ref Range Status   Specimen Description BLOOD LEFT ANTECUBITAL  Final   Special Requests   Final    BOTTLES DRAWN AEROBIC AND ANAEROBIC Blood Culture adequate volume   Culture  Setup Time   Final    AEROBIC BOTTLE ONLY YEAST CRITICAL VALUE NOTED.  VALUE IS CONSISTENT WITH PREVIOUSLY REPORTED AND CALLED VALUE. Performed at [facility], [place] [address] [place], [place] [address]    Culture CRYPTOCOCCUS NEOFORMANS (A)  Final   Report Status 12/26/2016 FINAL  Final  Culture, blood (Routine X 2) w Reflex to ID Panel     Status: Abnormal   Collection Time: 12/20/16  6:54 AM  Result Value Ref Range Status   Specimen Description BLOOD RIGHT ANTECUBITAL  Final   Special Requests IN PEDIATRIC BOTTLE Blood Culture adequate volume  Final   Culture  Setup Time   Final    IN PEDIATRIC BOTTLE YEAST CRITICAL RESULT CALLED TO, READ BACK BY AND VERIFIED WITHJerel Shepherd PHARMD, AT 7846 12/23/16 BY Rush Landmark Performed at [facility], [place] [address] [place], [place] [address]    Culture CRYPTOCOCCUS NEOFORMANS (A)  Final   Report Status 12/25/2016 FINAL  Final  Blood Culture ID Panel (Reflexed)     Status: None    Collection Time: 12/20/16  6:54 AM  Result Value Ref Range Status   Enterococcus species NOT DETECTED NOT DETECTED Final   Listeria monocytogenes NOT DETECTED NOT DETECTED Final   Staphylococcus species NOT DETECTED NOT DETECTED Final   Staphylococcus aureus NOT DETECTED NOT DETECTED Final   Streptococcus species NOT DETECTED NOT DETECTED Final   Streptococcus agalactiae NOT DETECTED NOT DETECTED Final   Streptococcus pneumoniae NOT DETECTED NOT DETECTED Final   Streptococcus pyogenes NOT DETECTED NOT DETECTED Final   Acinetobacter baumannii NOT DETECTED NOT DETECTED Final   Enterobacteriaceae species NOT DETECTED NOT DETECTED Final   Enterobacter cloacae complex NOT DETECTED NOT DETECTED Final   Escherichia coli NOT DETECTED NOT DETECTED Final   Klebsiella oxytoca NOT DETECTED NOT DETECTED Final   Klebsiella pneumoniae NOT DETECTED NOT DETECTED Final   Proteus species NOT DETECTED NOT DETECTED Final   Serratia marcescens NOT DETECTED NOT DETECTED Final   Haemophilus influenzae NOT DETECTED NOT DETECTED Final   Neisseria meningitidis NOT DETECTED NOT DETECTED Final   Pseudomonas aeruginosa NOT DETECTED NOT DETECTED Final   Candida albicans NOT DETECTED NOT DETECTED Final   Candida glabrata NOT DETECTED NOT DETECTED Final   Candida krusei NOT DETECTED NOT DETECTED Final   Candida parapsilosis NOT DETECTED NOT DETECTED Final   Candida tropicalis NOT DETECTED NOT DETECTED Final    Comment: Performed at [facility], [place] [address], [place], [place] [address]  Surgical pcr screen     Status: None   Collection Time: 12/21/16  2:25 AM  Result Value Ref Range Status   MRSA, PCR NEGATIVE NEGATIVE Final   Staphylococcus aureus NEGATIVE NEGATIVE Final    Comment:        The Xpert SA Assay (FDA approved for NASAL specimens in patients over 8 years of age), is one component of a comprehensive surveillance program.  Test performance has been validated by [facility] for patients  greater than or equal to 60 year old. It is not intended to diagnose infection  nor to guide or monitor treatment.   Blood culture (routine x 2)     Status: Abnormal   Collection Time: 12/21/16  4:11 AM  Result Value Ref Range Status   Specimen Description BLOOD LEFT ANTECUBITAL  Final   Special Requests   Final    BOTTLES DRAWN AEROBIC ONLY Blood Culture adequate volume   Culture  Setup Time   Final    YEAST AEROBIC BOTTLE ONLY CRITICAL VALUE NOTED.  VALUE IS CONSISTENT WITH PREVIOUSLY REPORTED AND CALLED VALUE.    Culture CRYPTOCOCCUS NEOFORMANS (A)  Final   Report Status 12/26/2016 FINAL  Final  Blood culture (routine x 2)     Status: Abnormal   Collection Time: 12/21/16  4:11 AM  Result Value Ref Range Status   Specimen Description BLOOD RIGHT HAND  Final   Special Requests IN PEDIATRIC BOTTLE Blood Culture adequate volume  Final   Culture  Setup Time   Final    IN PEDIATRIC BOTTLE YEAST CRITICAL RESULT CALLED TO, READ BACK BY AND VERIFIED WITH: G.HIBBETT PHARMD, 7654 12/24/16 M.CAMPBELL    Culture CRYPTOCOCCUS NEOFORMANS (A)  Final   Report Status 12/26/2016 FINAL  Final  Blood Culture ID Panel (Reflexed)     Status: None   Collection Time: 12/21/16  4:11 AM  Result Value Ref Range Status   Enterococcus species NOT DETECTED NOT DETECTED Final   Listeria monocytogenes NOT DETECTED NOT DETECTED Final   Staphylococcus species NOT DETECTED NOT DETECTED Final   Staphylococcus aureus NOT DETECTED NOT DETECTED Final   Streptococcus species NOT DETECTED NOT DETECTED Final   Streptococcus agalactiae NOT DETECTED NOT DETECTED Final   Streptococcus pneumoniae NOT DETECTED NOT DETECTED Final   Streptococcus pyogenes NOT DETECTED NOT DETECTED Final   Acinetobacter baumannii NOT DETECTED NOT DETECTED Final   Enterobacteriaceae species NOT DETECTED NOT DETECTED Final   Enterobacter cloacae complex NOT DETECTED NOT DETECTED Final   Escherichia coli NOT DETECTED NOT DETECTED Final    Klebsiella oxytoca NOT DETECTED NOT DETECTED Final   Klebsiella pneumoniae NOT DETECTED NOT DETECTED Final   Proteus species NOT DETECTED NOT DETECTED Final   Serratia marcescens NOT DETECTED NOT DETECTED Final   Haemophilus influenzae NOT DETECTED NOT DETECTED Final   Neisseria meningitidis NOT DETECTED NOT DETECTED Final   Pseudomonas aeruginosa NOT DETECTED NOT DETECTED Final   Candida albicans NOT DETECTED NOT DETECTED Final   Candida glabrata NOT DETECTED NOT DETECTED Final   Candida krusei NOT DETECTED NOT DETECTED Final   Candida parapsilosis NOT DETECTED NOT DETECTED Final   Candida tropicalis NOT DETECTED NOT DETECTED Final  Aerobic/Anaerobic Culture (surgical/deep wound)     Status: Abnormal (Preliminary result)   Collection Time: 12/21/16  1:39 PM  Result Value Ref Range Status   Specimen Description ABSCESS  Final   Special Requests   Final    PERIANAL PATIENT ON FOLLOWING  SEPTRA DS AND ZITHROMAX   Gram Stain   Final    FEW WBC PRESENT,BOTH PMN AND MONONUCLEAR FEW GRAM VARIABLE ROD    Culture (A)  Final    MULTIPLE ORGANISMS PRESENT, NONE PREDOMINANT HOLDING FOR POSSIBLE ANAEROBE    Report Status PENDING  Incomplete  CSF culture with Stat gram stain     Status: None   Collection Time: 12/22/16  9:29 AM  Result Value Ref Range Status   Specimen Description BACK  Final   Special Requests Immunocompromised  Final   Gram Stain   Final    CYTOSPIN SMEAR WBC  PRESENT, PREDOMINANTLY MONONUCLEAR YEAST CRITICAL RESULT CALLED TO, READ BACK BY AND VERIFIED WITH: RN Morton Peters [phone] MLM    Culture ABUNDANT CRYPTOCOCCUS NEOFORMANS  Final   Report Status 12/25/2016 FINAL  Final  Culture, fungus without smear     Status: Abnormal (Preliminary result)   Collection Time: 12/22/16  9:51 AM  Result Value Ref Range Status   Specimen Description CSF  Final   Special Requests Immunocompromised  Final   Culture CRYPTOCOCCUS NEOFORMANS (A)  Final   Report Status PENDING   Incomplete  CSF culture with Stat gram stain     Status: None (Preliminary result)   Collection Time: 12/25/16  5:04 PM  Result Value Ref Range Status   Specimen Description CSF  Final   Special Requests Immunocompromised  Final   Gram Stain   Final    WBC PRESENT,BOTH PMN AND MONONUCLEAR BUDDING YEAST SEEN CYTOSPIN SMEAR CRITICAL RESULT CALLED TO, READ BACK BY AND VERIFIED WITH: L TURNER,RN AT 1821 12/25/16 BY L BENFIELD    Culture TOO YOUNG TO READ  Final   Report Status PENDING  Incomplete  Culture, fungus without smear     Status: None (Preliminary result)   Collection Time: 12/25/16  5:05 PM  Result Value Ref Range Status   Specimen Description CSF  Final   Special Requests Immunocompromised  Final   Culture NO FUNGUS ISOLATED AFTER 1 DAY  Final   Report Status PENDING  Incomplete  Culture, bal-quantitative     Status: None (Preliminary result)   Collection Time: 12/26/16  7:06 PM  Result Value Ref Range Status   Specimen Description BRONCHIAL ALVEOLAR LAVAGE  Final   Special Requests Immunocompromised  Final   Gram Stain   Final    RARE WBC PRESENT, PREDOMINANTLY PMN NO ORGANISMS SEEN    Culture PENDING  Incomplete   Report Status PENDING  Incomplete    Lipid Panel  Recent Labs  12/26/16 1902  TRIG 144    Studies/Results: Dg Abd 1 View  Result Date: 12/26/2016 CLINICAL DATA:  31 year old male status post intubation and enteric tube placement. EXAM: PORTABLE CHEST 1 VIEW COMPARISON:  Chest radiograph dated 12/23/2016 and chest CT dated 12/21/2016 FINDINGS: There has been interval placement of an endotracheal tube with tip approximately 4.5 cm above the carina. An enteric tube extends below the diaphragm with tip over the right L4 pedicle likely in the distal stomach. The side port of the enteric tube is likely located in the gastric body. There is a small right pleural effusion, increased from prior study. There is associated partial atelectasis of the right lung  base. Right hilar prominence as seen on the prior radiograph. There is no pneumothorax. The cardiac silhouette is within normal limits. There is paucity of the small bowel air. Air is noted throughout the colon. There is no bowel dilatation or evidence of obstruction. A 12 mm linear radiopaque density to the right of L2-L3 disc may represent calcification or surgical clip. No free air identified. The osseous structures and soft tissues appear unremarkable IMPRESSION: 1. Endotracheal tube above the carina and enteric tube the tip likely in the distal stomach. 2. Right-sided pleural effusion, increased compared to prior study. There is associated compressive atelectasis versus infiltrate of the right lung base. 3. Right hilar prominence, likely related to underlying lymphadenopathy. 4. No evidence of bowel obstruction. Electronically Signed   By: Anner Crete M.D.   On: 12/26/2016 20:01   Ct Head Wo Contrast  Result Date:  12/26/2016 CLINICAL DATA:  Meningitis. EXAM: CT HEAD WITHOUT CONTRAST TECHNIQUE: Contiguous axial images were obtained from the base of the skull through the vertex without intravenous contrast. COMPARISON:  CT scan of Dec 23, 2016. FINDINGS: Brain: No evidence of acute infarction, hemorrhage, hydrocephalus, extra-axial collection or mass lesion/mass effect. Vascular: No hyperdense vessel or unexpected calcification. Skull: Normal. Negative for fracture or focal lesion. Sinuses/Orbits: No acute finding. Other: None. IMPRESSION: Normal head CT. Electronically Signed   By: Marijo Conception, M.D.   On: 12/26/2016 18:37   Mr Jeri Cos YP Contrast  Result Date: 12/27/2016 CLINICAL DATA:  Worsening headache and decreased responsiveness 4 2 days. Of time did. History of cryptococcal meningitis. EXAM: MRI HEAD WITHOUT AND WITH CONTRAST TECHNIQUE: Multiplanar, multiecho pulse sequences of the brain and surrounding structures were obtained without and with intravenous contrast. CONTRAST:  38m  MULTIHANCE GADOBENATE DIMEGLUMINE 529 MG/ML IV SOLN COMPARISON:  CT HEAD Dec 26, 2016 and Dec 23, 2016 FINDINGS: INTRACRANIAL CONTENTS: Faint reduced diffusion and decreased ADC values within bilateral inferior basal ganglia associated with T2 bright signal and mild leptomeningeal enhancement. Favor FLAIR T2 hyperintense signal within the dentate nuclei. No susceptibility artifact to suggest hemorrhage. The ventricles and sulci are normal for patient's age. No mass mass effect. No abnormal extra-axial fluid collections. No extra-axial masses. VASCULAR: Normal major intracranial vascular flow voids present at skull base. SKULL AND UPPER CERVICAL SPINE: No abnormal sellar expansion. No suspicious calvarial bone marrow signal. Craniocervical junction maintained. SINUSES/ORBITS: Mild paranasal sinus mucosal thickening. Small bilateral mastoid effusions. The included ocular globes and orbital contents are non-suspicious. OTHER: Life-support lines in place. 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 Electronically Signed   By: CElon AlasM.D.   On: 12/27/2016 03:21   Dg Chest Port 1 View  Result Date: 12/26/2016 CLINICAL DATA:  31year old male status post intubation and enteric tube placement. EXAM: PORTABLE CHEST 1 VIEW COMPARISON:  Chest radiograph dated 12/23/2016 and chest CT dated 12/21/2016 FINDINGS: There has been interval placement of an endotracheal tube with tip approximately 4.5 cm above the carina. An enteric tube extends below the diaphragm with tip over the right L4 pedicle likely in the distal stomach. The side port of the enteric tube is likely located in the gastric body. There is a small right pleural effusion, increased from prior study. There is associated partial atelectasis of the right lung base. Right hilar prominence as seen on the prior radiograph. There is no pneumothorax. The  cardiac silhouette is within normal limits. There is paucity of the small bowel air. Air is noted throughout the colon. There is no bowel dilatation or evidence of obstruction. A 12 mm linear radiopaque density to the right of L2-L3 disc may represent calcification or surgical clip. No free air identified. The osseous structures and soft tissues appear unremarkable IMPRESSION: 1. Endotracheal tube above the carina and enteric tube the tip likely in the distal stomach. 2. Right-sided pleural effusion, increased compared to prior study. There is associated compressive atelectasis versus infiltrate of the right lung base. 3. Right hilar prominence, likely related to underlying lymphadenopathy. 4. No evidence of bowel obstruction. Electronically Signed   By: AAnner CreteM.D.   On: 12/26/2016 20:01    Medications:  Scheduled: . [START ON 12/30/2016] azithromycin  1,200 mg Per Tube Q Fri  . chlorhexidine gluconate (MEDLINE KIT)  15 mL Mouth Rinse BID  . feeding supplement (PRO-STAT SUGAR FREE  64)  30 mL Per Tube BID  . feeding supplement (VITAL HIGH PROTEIN)  1,000 mL Per Tube Q24H  . flucytosine  1,500 mg Per Tube Q6H  . lidocaine (PF)  5 mL Other Once  . mouth rinse  15 mL Mouth Rinse QID  . potassium chloride  40 mEq Per Tube Once  . sulfamethoxazole-trimethoprim  20 mL Per Tube Q12H  . valACYclovir  1,000 mg Per Tube BID    Impression: 31 year old male who developed progressive worsening mental status along with focal neurological symptoms in the setting of elevated ICP. Symptoms did seem to improve after lumbar puncture. CSF positive for cryptococcal meningitis.  1. CSF culture shows budding yeast and is cryptococcal Ag positive. Blood cultures also positive for cryptococcus. ID is following. On IV amphotericin B and flucytosine per NGT.   2. MRI performed at 2:58 AM today shows faint reduced diffusion and decreased ADC values within bilateral inferior basal ganglia associated with T2 bright  signal and mild leptomeningeal enhancement. 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  Recommendations: 1. Continue IV amphotericin B and flucytosine per NGT. ID following.  2. Also on ABX for perirectal abscess 3. On Valacyclovir for penile herpes 4. To OR for lumbar drain placement by Dr. Kathyrn Sheriff 5. Neurology will continue to follow   LOS: 6 days   40 minutes spent in the Neurological evaluation and management of this critically ill patient.   _0  signed: Dr. Kerney Elbe 12/27/2016  7:11 AM

## 2016-12-27 NOTE — Care Management (Signed)
CM spoke with April (Health Service Administrator at [facility]) and was informed that pt is no longer in custody - pt has been released with an unsecured bond.  DON Javier Glazier is out of office for the entire week

## 2016-12-27 NOTE — Progress Notes (Signed)
Pt arrived back to ICU from OR and placed back on vent.  RR changed per NP order.  Vitals stable at this time.

## 2016-12-27 NOTE — Progress Notes (Signed)
RT note- Patient taken to MRI and returned to 4N-25, remains on current ventilator settings, fio2 decreased to 40%.

## 2016-12-27 NOTE — Progress Notes (Signed)
[facility] for Infectious Disease    Date of Admission:  12/20/2016   Total days of antibiotics 8                                                                                     Day 6 L-ampho/flycytosine                                                                                     Day 6 acyclovir                                                                                     Day 8 oi proph bactrim/weekly azithro   ID: Fredrich Cory is a 31 y.o. male with  advanced hiv disease/AIDS admitted for perirectal abscess found to have cryptococcemia and cryptococcal meningitis  Principal Problem:   Perirectal abscess Active Problems:   Chronic pain   AIDS (acquired immune deficiency syndrome) (HCC)   Hyponatremia   Normocytic anemia   Elevated LFTs   Pulmonary infiltrates   Hypomagnesemia   Encephalopathy acute   SIRS (systemic inflammatory response syndrome) (HCC)   Acute respiratory failure (HCC)   Rectal abscess   Cryptococcal meningoencephalitis (HCC)   Cryptococcosis (HCC)   Cavitary pneumonia   Diffuse lymphadenopathy   Drug rash   Elevated intracranial pressure   HIV disease (HCC)   Lymphadenopathy of head and neck    Subjective: Intubated and non-responsive. His mother is at the bedside (very tearful; asking for chaplain services)   Medications:  . [START ON 12/30/2016] azithromycin  1,200 mg Per Tube Q Fri  . chlorhexidine gluconate (MEDLINE KIT)  15 mL Mouth Rinse BID  . feeding supplement (PRO-STAT SUGAR FREE 64)  30 mL Per Tube BID  . feeding supplement (VITAL HIGH PROTEIN)  1,000 mL Per Tube Q24H  . flucytosine  1,500 mg Per Tube Q6H  . lidocaine (PF)  5 mL Other Once  . mouth rinse  15 mL Mouth Rinse QID  . potassium chloride  40 mEq Per Tube Once  . sulfamethoxazole-trimethoprim  20 mL Per Tube Q12H  . valACYclovir  1,000 mg Per Tube BID    Objective: Vital signs in last 24 hours: Temp:  [97.8 F (36.6 C)-101.1 F (38.4 C)] 98.9 F  (37.2 C) (05/29 1135) Pulse Rate:  [47-87] 53 (05/29 1115) Resp:  [16-24] 16 (05/29 1115) BP: (101-171)/(64-112) 104/67 (05/29 1115) SpO2:  [94 %-100 %] 100 % (05/29 1115) FiO2 (%):  [50 %-100 %]  50 % (05/29 1115)   General appearance: unresponsive on ventilator without sedation  Eyes: Pupils 3/3 with delayed response Cardio: regular rate and rhythm, S1, S2 normal, no murmur, click, rub or gallop GI: soft, non-tender; bowel sounds normal; no masses,  no organomegaly Male genitalia: normal, deferred with family present Pulses: 2+ and symmetric Skin: Skin color, texture, turgor normal. No rashes or lesions or rash improved to face since stopping Amox-Clav Lymph nodes: + cervical, inguinal, axillary lymphadenopathy Neurologic: unresponsive  Lab Results  Recent Labs  12/26/16 0230 12/26/16 1857 12/27/16 0524  WBC 6.9  --  9.7  HGB 6.9* 9.7* 9.7*  HCT 22.3* 30.0* 29.3*  NA 136  --  137  K 3.3*  --  3.7  CL 112*  --  110  CO2 19*  --  19*  BUN <5*  --  <5*  CREATININE 0.88  --  0.99   Liver Panel  Recent Labs  12/27/16 0524  PROT 8.5*  ALBUMIN 2.3*  AST 36  ALT 57  ALKPHOS 144*  BILITOT 0.7  BILIDIR 0.2  IBILI 0.5   Sedimentation Rate No results for input(s): ESRSEDRATE in the last 72 hours. C-Reactive Protein No results for input(s): CRP in the last 72 hours.  Microbiology:  Studies/Results: Dg Abd 1 View  Result Date: 12/26/2016 CLINICAL DATA:  31 year old male status post intubation and enteric tube placement. EXAM: PORTABLE CHEST 1 VIEW COMPARISON:  Chest radiograph dated 12/23/2016 and chest CT dated 12/21/2016 FINDINGS: There has been interval placement of an endotracheal tube with tip approximately 4.5 cm above the carina. An enteric tube extends below the diaphragm with tip over the right L4 pedicle likely in the distal stomach. The side port of the enteric tube is likely located in the gastric body. There is a small right pleural effusion, increased from  prior study. There is associated partial atelectasis of the right lung base. Right hilar prominence as seen on the prior radiograph. There is no pneumothorax. The cardiac silhouette is within normal limits. There is paucity of the small bowel air. Air is noted throughout the colon. There is no bowel dilatation or evidence of obstruction. A 12 mm linear radiopaque density to the right of L2-L3 disc may represent calcification or surgical clip. No free air identified. The osseous structures and soft tissues appear unremarkable IMPRESSION: 1. Endotracheal tube above the carina and enteric tube the tip likely in the distal stomach. 2. Right-sided pleural effusion, increased compared to prior study. There is associated compressive atelectasis versus infiltrate of the right lung base. 3. Right hilar prominence, likely related to underlying lymphadenopathy. 4. No evidence of bowel obstruction. Electronically Signed   By: Anner Crete M.D.   On: 12/26/2016 20:01   Ct Head Wo Contrast  Result Date: 12/26/2016 CLINICAL DATA:  Meningitis. EXAM: CT HEAD WITHOUT CONTRAST TECHNIQUE: Contiguous axial images were obtained from the base of the skull through the vertex without intravenous contrast. COMPARISON:  CT scan of Dec 23, 2016. FINDINGS: Brain: No evidence of acute infarction, hemorrhage, hydrocephalus, extra-axial collection or mass lesion/mass effect. Vascular: No hyperdense vessel or unexpected calcification. Skull: Normal. Negative for fracture or focal lesion. Sinuses/Orbits: No acute finding. Other: None. IMPRESSION: Normal head CT. Electronically Signed   By: Marijo Conception, M.D.   On: 12/26/2016 18:37   Mr Jeri Cos ZO Contrast  Result Date: 12/27/2016 CLINICAL DATA:  Worsening headache and decreased responsiveness 4 2 days. Of time did. History of cryptococcal meningitis. EXAM: MRI HEAD  WITHOUT AND WITH CONTRAST TECHNIQUE: Multiplanar, multiecho pulse sequences of the brain and surrounding structures were  obtained without and with intravenous contrast. CONTRAST:  30m MULTIHANCE GADOBENATE DIMEGLUMINE 529 MG/ML IV SOLN COMPARISON:  CT HEAD Dec 26, 2016 and Dec 23, 2016 FINDINGS: INTRACRANIAL CONTENTS: Faint reduced diffusion and decreased ADC values within bilateral inferior basal ganglia associated with T2 bright signal and mild leptomeningeal enhancement. Favor FLAIR T2 hyperintense signal within the dentate nuclei. No susceptibility artifact to suggest hemorrhage. The ventricles and sulci are normal for patient's age. No mass mass effect. No abnormal extra-axial fluid collections. No extra-axial masses. VASCULAR: Normal major intracranial vascular flow voids present at skull base. SKULL AND UPPER CERVICAL SPINE: No abnormal sellar expansion. No suspicious calvarial bone marrow signal. Craniocervical junction maintained. SINUSES/ORBITS: Mild paranasal sinus mucosal thickening. Small bilateral mastoid effusions. The included ocular globes and orbital contents are non-suspicious. OTHER: Life-support lines in place. 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 Electronically Signed   By: CElon AlasM.D.   On: 12/27/2016 03:21   Dg Lumbar Spine 1 View  Result Date: 12/27/2016 CLINICAL DATA:  Placement of lumbar drain. EXAM: LUMBAR SPINE - 1 VIEW; DG C-ARM 61-120 MIN COMPARISON:  None. FLUOROSCOPY TIME:  6 seconds FINDINGS: A single image demonstrates a device at what appears to be the L4-5 level just to the right of midline. IMPRESSION: Device at L4-5 just to the right of midline. Electronically Signed   By: JLorriane ShireM.D.   On: 12/27/2016 09:59   Dg Chest Port 1 View  Result Date: 12/27/2016 CLINICAL DATA:  Respiratory failure. EXAM: PORTABLE CHEST 1 VIEW COMPARISON:  12/26/2016 FINDINGS: Endotracheal tube terminates 3 cm above the carina. Enteric tube courses into the left upper abdomen with tip  not imaged. Cardiomediastinal silhouette is unchanged. Prominence of the right paratracheal and right hilar soft tissues is again noted corresponding to lymphadenopathy demonstrated on recent CT. There is likely a persistent small right pleural effusion, although it is less conspicuous than on the prior radiograph. Interstitial and airspace opacities in the mid and lower lungs bilaterally have increased. No pneumothorax is identified. IMPRESSION: 1. Increased bilateral airspace disease concerning for pneumonia. 2. Small right pleural effusion. Electronically Signed   By: ALogan BoresM.D.   On: 12/27/2016 07:27   Dg Chest Port 1 View  Result Date: 12/26/2016 CLINICAL DATA:  [age]year old male status post intubation and enteric tube placement. EXAM: PORTABLE CHEST 1 VIEW COMPARISON:  Chest radiograph dated 12/23/2016 and chest CT dated 12/21/2016 FINDINGS: There has been interval placement of an endotracheal tube with tip approximately 4.5 cm above the carina. An enteric tube extends below the diaphragm with tip over the right L4 pedicle likely in the distal stomach. The side port of the enteric tube is likely located in the gastric body. There is a small right pleural effusion, increased from prior study. There is associated partial atelectasis of the right lung base. Right hilar prominence as seen on the prior radiograph. There is no pneumothorax. The cardiac silhouette is within normal limits. There is paucity of the small bowel air. Air is noted throughout the colon. There is no bowel dilatation or evidence of obstruction. A 12 mm linear radiopaque density to the right of L2-L3 disc may represent calcification or surgical clip. No free air identified. The osseous structures and soft tissues appear unremarkable IMPRESSION: 1. Endotracheal tube above the carina and enteric tube the tip likely  in the distal stomach. 2. Right-sided pleural effusion, increased compared to prior study. There is associated compressive  atelectasis versus infiltrate of the right lung base. 3. Right hilar prominence, likely related to underlying lymphadenopathy. 4. No evidence of bowel obstruction. Electronically Signed   By: Anner Crete M.D.   On: 12/26/2016 20:01   Dg C-arm 1-60 Min  Result Date: 12/27/2016 CLINICAL DATA:  Placement of lumbar drain. EXAM: LUMBAR SPINE - 1 VIEW; DG C-ARM 61-120 MIN COMPARISON:  None. FLUOROSCOPY TIME:  6 seconds FINDINGS: A single image demonstrates a device at what appears to be the L4-5 level just to the right of midline. IMPRESSION: Device at L4-5 just to the right of midline. Electronically Signed   By: Lorriane Shire M.D.   On: 12/27/2016 09:59     Assessment/Plan: Disseminated cryptococcal disease including CM - continue with L-ampho plus flucytosine. Close monitoring for side effects/repletion of electrolytes. Currently on day 6 of 14. Lumbar drain in place - greatly appreciate neurosurgery assistance. MRI of brain with new multiple areas of cryptococcal infarctions, unfortunately. Very important he gets PO flucytosine via NG tube while intubated please for full dual antifungal treatment of CM.   HIV/AIDS = continue on oi proph with bactrim plus azithromycin weekly. Defer ART for 5 weeks after CM treatment.   Genital herpes =currently day 6 of 7-10 on acyclovir --> will follow clinically in AM on day 7.   Perirectal abscess= treated with short course of abtx. Continue with daily dressing changes. Concern re: stool contamination with incontinence of bowels now. Monitor for clinical progression and wound care as needed to keep as clean as possible. Appreciate nursing assistance.   Cavitary lesions = bronchoscopy results pending to rule out mTB given clinical history and being incarcerated  Lymphadenopathy = still follow up with IR when they will be doing procesdure early in the week  Fever= improving curve, but temp of 100.7 yesterday    Mother updated regarding new findings  on MRI. Offered support and advised we need to watch and wait for signs of neurocognitive recovery while we support him.   [name], MSN, NP-C [facility] [place] Cell: [phone] Pager: [phone]  12/27/2016, 11:44 AM

## 2016-12-27 NOTE — Anesthesia Postprocedure Evaluation (Signed)
Anesthesia Post Note  Patient: William Pena  Procedure(s) Performed: Procedure(s) (LRB): PLACEMENT OF LUMBAR DRAIN (N/A)  Patient location during evaluation: SICU Anesthesia Type: General Level of consciousness: sedated and patient remains intubated per anesthesia plan Pain management: pain level controlled Vital Signs Assessment: post-procedure vital signs reviewed and stable Respiratory status: patient remains intubated per anesthesia plan Cardiovascular status: stable Anesthetic complications: no       Last Vitals:  Vitals:   12/27/16 1045 12/27/16 1100  BP: 101/64 103/67  Pulse: (!) 55 (!) 55  Resp: 16 16  Temp:      Last Pain:  Vitals:   12/27/16 0742  TempSrc: Oral  PainSc:                  William Pena,William Pena

## 2016-12-27 NOTE — Progress Notes (Signed)
STAT MRI completed. I have performed preliminary review of the images, which reveal extension of the previously seen DWI abnormality on the left. In the context of the patient's diagnosis of cryptococcal meningitis, the findings are most consistent with ischemic infarction within the basal ganglia, left worse than right, secondary to perivascular infiltration of the small arterial perforators by cryptococcal infection and associated inflammatory response. No treatment changes indicated. Continue antifungal treatment and supportive care with close monitoring and frequent neuro checks.   Electronically signed: Dr. Kerney Elbe

## 2016-12-27 NOTE — Consult Note (Signed)
CC:  Chief Complaint  Patient presents with  . Rectal Bleeding  . Generalized Body Aches    HPI: William Pena is a 31 y.o. male [facility] with the above complaints, with AIDS and positive serum cryptococcal Ag. Further workup included LP demonstrating yeast c/w cryptococcal meningitis. Became severely obtunded yesterday requiring intubation, had repeat LP with high pressure and tapped down to ~20cmH2O with some improvement. Spoke with neurology last night who requested lumbar drainage given the above events for more continuous CSF drainage.  PMH: Past Medical History:  Diagnosis Date  . HIV (human immunodeficiency virus infection) ([place])   . Perirectal abscess     PSH: Past Surgical History:  Procedure Laterality Date  . INCISION AND DRAINAGE PERIRECTAL ABSCESS    . INCISION AND DRAINAGE PERIRECTAL ABSCESS N/A 12/21/2016   Procedure: IRRIGATION AND DEBRIDEMENT PERIRECTAL ABSCESS;  Surgeon: Ralene Ok, MD;  Location: [facility];  Service: General;  Laterality: N/A;    SH: Social History  Substance Use Topics  . Smoking status: Former Smoker    Packs/day: 0.33    Types: Cigarettes  . Smokeless tobacco: Never Used  . Alcohol use No    MEDS: Prior to Admission medications   Medication Sig Start Date End Date Taking? Authorizing Provider  acetaminophen (TYLENOL) 325 MG tablet Take 975 mg by mouth 3 (three) times daily as needed for moderate pain.    Yes [provider]  docusate sodium (COLACE) 100 MG capsule Take 100 mg by mouth 2 (two) times daily.   Yes [provider]  ondansetron (ZOFRAN ODT) 4 MG disintegrating tablet Take 1 tablet (4 mg total) by mouth every 8 (eight) hours as needed for nausea or vomiting. 12/18/16  Yes Quintella Reichert, MD  sulfamethoxazole-trimethoprim (BACTRIM DS,SEPTRA DS) 800-160 MG tablet Take 1 tablet by mouth daily. 12/10/16 01/10/17 Yes [provider]  ibuprofen (ADVIL,MOTRIN) 800 MG tablet Take 1 tablet (800  mg total) by mouth every 8 (eight) hours as needed. Patient not taking: Reported on 12/17/2016 12/15/16   Long, Wonda Olds, MD    ALLERGY: Allergies  Allergen Reactions  . Augmentin [Amoxicillin-Pot Clavulanate]     "break out"    ROS: ROS  NEUROLOGIC EXAM: Intubated Breathes over vent Not following commands Localizes   IMGAING: MRI does not demonstrate ventriculomegaly or any radiographic signs of uncal/tentorial/tonsillar herniation.  IMPRESSION: - 31 y.o. male with likely cryptococcal meningitis requiring CSF drainage  PLAN: - Will place lumbar drain in OR with fluoro

## 2016-12-27 NOTE — Progress Notes (Signed)
Patient ID: William Pena, male   DOB: 06/09/1986, 31 y.o.   MRN: [id number]   Pt was scheduled for inguinal LAN biopsy last week Many events since then Intubated Unstable For OR today for lumbar drain placement  Will cancel LAN biopsy for now When pt is stable and able tolerate LAN biopsy Please re order.

## 2016-12-27 NOTE — Op Note (Signed)
PREOP DIAGNOSIS:  1. Cryptococcal meningitis   POSTOP DIAGNOSIS: Same  PROCEDURE: 1. Lumbar drain placement  SURGEON: Dr. Consuella Lose, MD  ASSISTANT: None  ANESTHESIA: IV sedation with local  EBL: Minimal  SPECIMENS: None  DRAINS: Lumbar drain  COMPLICATIONS: None immediate  CONDITION: Stable to ICU  HISTORY: William Pena is a 31 y.o. male admitted with disseminated cyrptococcal meningitis, with progressive obtundation requiring intubation yesterday. LP did demonstrate high pressure. Lumbar drainage was therefore indicated.  PROCEDURE IN DETAIL: After informed consent was obtained and witnessed, the patient was brought to the operating room. After he was sufficiently sedated, he was turned into the prone position and all pressure points were padded.   After timeout was conducted, AP fluoroscopy was used to confirm the surface projection of the interlaminar space at L4-5. This area was then infiltrated with local anesthetic. Tuohy needle was then introduced into the thecal space under fluoroscopic guidance. A stylette was removed and CSF was obtained. The lumbar drain catheter was then introduced and advanced to approximately 10 cm. Good flow of CSF from the distal end of the catheter was observed. The Touhy needle was then removed. The drain was then secured to the skin with a stitch. The sterile dressing was then placed. The patient tolerated the procedure well and was returned to the intensive care unit in stable hemodynamic condition.

## 2016-12-27 NOTE — Progress Notes (Signed)
Pt transferred to 4N25 at this time.  Pt remains unresponsive.  Report given to [place].

## 2016-12-27 NOTE — Anesthesia Preprocedure Evaluation (Signed)
Anesthesia Evaluation   Patient unresponsive    Reviewed: Unable to perform ROS - Chart review only  Airway Mallampati: Intubated       Dental no notable dental hx.    Pulmonary pneumonia, former smoker,    + rhonchi        Cardiovascular  Rhythm:Regular Rate:Normal     Neuro/Psych Meningitis /encephalitis    GI/Hepatic   Endo/Other    Renal/GU      Musculoskeletal   Abdominal   Peds  Hematology  (+) anemia , HIV, HiV and complications   Anesthesia Other Findings   Reproductive/Obstetrics                             Anesthesia Physical Anesthesia Plan  ASA: IV  Anesthesia Plan: General   Post-op Pain Management:    Induction: Inhalational  Airway Management Planned: Oral ETT  Additional Equipment:   Intra-op Plan:   Post-operative Plan: Post-operative intubation/ventilation  Informed Consent: I have reviewed the patients History and Physical, chart, labs and discussed the procedure including the risks, benefits and alternatives for the proposed anesthesia with the patient or authorized representative who has indicated his/her understanding and acceptance.   Dental advisory given and History available from chart only  Plan Discussed with:   Anesthesia Plan Comments:         Anesthesia Quick Evaluation

## 2016-12-27 NOTE — Progress Notes (Signed)
6 Days Post-Op    CC:  Perirectal abscess/draining seton in current fistula from [facility]  Subjective: Lumbar drain placed yesterday. Intubated.   Objective: Vital signs in last 24 hours: Temp:  [97.4 F (36.3 C)-101.1 F (38.4 C)] 101.1 F (38.4 C) (05/29 0742) Pulse Rate:  [47-87] 87 (05/29 0700) Resp:  [16-24] 24 (05/29 0700) BP: (113-171)/(75-112) 153/99 (05/29 0700) SpO2:  [94 %-100 %] 97 % (05/29 0700) FiO2 (%):  [50 %-100 %] 50 % (05/29 0400) Last BM Date: 12/26/16   Intake/Output from previous day: 05/28 0701 - 05/29 0700 In: 5491.4 [P.O.:1080; I.V.:1921.4; Blood:700; NG/GT:90; IV Piggyback:1700] Out: 4203 [Urine:3700; Emesis/NG output:500; Stool:3] Intake/Output this shift: No intake/output data recorded.  Perirectal wound:  1.5 cm in deepest aspect of abscess. One other area draining medially to drainage site.    Lab Results:   Recent Labs  12/26/16 0230 12/26/16 1857 12/27/16 0524  WBC 6.9  --  9.7  HGB 6.9* 9.7* 9.7*  HCT 22.3* 30.0* 29.3*  PLT 108*  --  146*    BMET  Recent Labs  12/26/16 0230 12/27/16 0524  NA 136 137  K 3.3* 3.7  CL 112* 110  CO2 19* 19*  GLUCOSE 86 108*  BUN <5* <5*  CREATININE 0.88 0.99  CALCIUM 8.0* 8.4*     Recent Labs Lab 12/20/16 1947 12/22/16 0658 12/27/16 0524  AST 139* 154* 36  ALT 62 89* 57  ALKPHOS 76 82 144*  BILITOT 1.3* 0.8 0.7  PROT 8.4* 7.7 8.5*  ALBUMIN 2.6* 2.2* 2.3*       Medications: . [START ON 12/30/2016] azithromycin  1,200 mg Per Tube Q Fri  . chlorhexidine gluconate (MEDLINE KIT)  15 mL Mouth Rinse BID  . flucytosine  1,500 mg Per Tube Q6H  . lidocaine (PF)  5 mL Other Once  . mouth rinse  15 mL Mouth Rinse QID  . potassium chloride  30 mEq Per Tube BID  . sulfamethoxazole-trimethoprim  20 mL Per Tube Q12H  . valACYclovir  1,000 mg Per Tube BID   . sodium chloride 75 mL/hr at 12/27/16 0600  . amphotericin  B  Liposome (AMBISOME) ADULT IV Stopped (12/26/16 1229)  . cefTRIAXone  (ROCEPHIN)  IV Stopped (12/26/16 2230)  . famotidine (PEPCID) IV Stopped (12/26/16 2055)  . fentaNYL infusion INTRAVENOUS 150 mcg/hr (12/27/16 0625)  . propofol (DIPRIVAN) infusion 30 mcg/kg/min (12/27/16 0615)   Antibiotics Given (last 72 hours)    Date/Time Action Medication Dose Rate   12/24/16 0953 Given   valACYclovir (VALTREX) tablet 1,000 mg 1,000 mg    12/24/16 0953 Given   sulfamethoxazole-trimethoprim (BACTRIM DS,SEPTRA DS) 800-160 MG per tablet 1 tablet 1 tablet    12/24/16 1846 New Bag/Given   cefTRIAXone (ROCEPHIN) 2 g in dextrose 5 % 50 mL IVPB 2 g 100 mL/hr   12/24/16 2223 Given   valACYclovir (VALTREX) tablet 1,000 mg 1,000 mg    12/24/16 2223 Given   sulfamethoxazole-trimethoprim (BACTRIM DS,SEPTRA DS) 800-160 MG per tablet 1 tablet 1 tablet    12/25/16 1006 Given   valACYclovir (VALTREX) tablet 1,000 mg 1,000 mg    12/25/16 1006 Given   sulfamethoxazole-trimethoprim (BACTRIM DS,SEPTRA DS) 800-160 MG per tablet 1 tablet 1 tablet    12/25/16 1828 New Bag/Given   cefTRIAXone (ROCEPHIN) 2 g in dextrose 5 % 50 mL IVPB 2 g 100 mL/hr   12/25/16 2128 Given   valACYclovir (VALTREX) tablet 1,000 mg 1,000 mg    12/25/16 2128 Given  sulfamethoxazole-trimethoprim (BACTRIM DS,SEPTRA DS) 800-160 MG per tablet 1 tablet 1 tablet    12/26/16 1034 Given   sulfamethoxazole-trimethoprim (BACTRIM DS,SEPTRA DS) 800-160 MG per tablet 1 tablet 1 tablet    12/26/16 1038 Given   valACYclovir (VALTREX) tablet 1,000 mg 1,000 mg    12/26/16 2200 New Bag/Given   cefTRIAXone (ROCEPHIN) 2 g in dextrose 5 % 50 mL IVPB 2 g 100 mL/hr   12/26/16 2200 Given   sulfamethoxazole-trimethoprim (BACTRIM,SEPTRA) 200-40 MG/5ML suspension 20 mL 20 mL       Assessment/Plan Perirectal abscess; S/p I&D 5/23 Dr Rosendo Gros - continue BID dressing changes - abx per ID - healing well, no surgical intervention needed at this time - will continue to see periodically while admitted. 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  Pulmonary infiltrates - intubated 12/27/16 SIRS (systemic inflammatory response syndrome) (HCC) Acute respiratory failure with hypoxia Chronic pain Anemia Encephalopathy Hyponatremia  FEN: IV fluids ID: Multiple per Dr. Baxter Flattery (ID) DVT:  SCD  Brigid Re , [facility] Surgery 12/28/2016, 11:04 AM Pager: [phone] Consults: [phone] Mon-Fri 7:00 am-4:30 pm Sat-Sun 7:00 am-11:30 am   LOS: 6 days

## 2016-12-27 NOTE — Progress Notes (Signed)
S/O: Status post lumbar drain placement by Neurosurgery. On returning to ICU, he is clinically not arousable and not moving any extremities  BP 104/67   Pulse (!) 53   Temp 98.9 F (37.2 C) (Oral)   Resp 16   Ht 5\' 6"  (1.676 m)   Wt 64.2 kg (141 lb 8 oz)   SpO2 100%   BMI 22.84 kg/m    45 minutes after sedation was stopped, he is not arousable and not moving any extremities spontaneously or to noxious stimuli.  A/R: 1. STAT CT obtained, revealing no acute changes on my preliminary reading. Await official radiology report.  2. STAT MRI with and without contrast has been ordered.  Electronically signed: Dr. Kerney Elbe

## 2016-12-27 NOTE — Progress Notes (Signed)
Responded to request from nurse to see pt's family in rm when on floor. First provided emotional/spiritual support, ministry of presence/touch  to pt's cousin, then his mom when she entered rm. Latter assured me that prayer matters and she already had her church folks praying for pt. She prayed for me as a woman of God, that all the pts I see would be blessed by God. Chaplain available for f/u.   12/27/16 1700  Clinical Encounter Type  Visited With Patient and family together;Health care provider  Visit Type Initial;Psychological support;Spiritual support;Social support;Critical Care  Referral From Nurse  Spiritual Encounters  Spiritual Needs Prayer;Emotional  Stress Factors  Patient Stress Factors Health changes;Loss of control  Family Stress Factors Family relationships;Health changes;Loss of control   Gerrit Heck, Chaplain

## 2016-12-27 NOTE — Progress Notes (Signed)
Per Dr. Kathyrn Sheriff, pt's [facility] may be elevated w/ lumbar drain.

## 2016-12-27 NOTE — Transfer of Care (Signed)
Immediate Anesthesia Transfer of Care Note  Patient: William Pena  Procedure(s) Performed: Procedure(s): PLACEMENT OF LUMBAR DRAIN (N/A)  Patient Location: ICU  Anesthesia Type:General  Level of Consciousness: sedated and Patient remains intubated per anesthesia plan  Airway & Oxygen Therapy: Patient remains intubated per anesthesia plan and Patient placed on Ventilator (see vital sign flow sheet for setting)  Post-op Assessment: Report given to RN and Post -op Vital signs reviewed and stable  Post vital signs: Reviewed and stable  Last Vitals:  Vitals:   12/27/16 0800 12/27/16 0900  BP: (!) 138/93 (!) 142/99  Pulse: 78 76  Resp: (!) 24 (!) 24  Temp:      Last Pain:  Vitals:   12/27/16 0742  TempSrc: Oral  PainSc:       Patients Stated Pain Goal: 2 (88/71/95 9747)  Complications: No apparent anesthesia complications

## 2016-12-27 NOTE — Progress Notes (Addendum)
Name: Anothony Bursch MRN: [id number] DOB: Mar 14, 1986    ADMISSION DATE:  12/20/2016 CONSULTATION DATE:  5/24 REFERRING MD :  Baxter Flattery   CHIEF COMPLAINT:  Disseminated cryptococcus  BRIEF  This is a 31 year old make w/ HIV/AIDS (last CD4 1 w/ VL 81K). Just started back on ART 2 months ago however he was incarcerated and reported he was not given his medications. He was admitted on 5/22 for perirectal abscess. During admission he was also noted to have HA, fever, chills, night sweats neck pain and LAN. ID was consulted and recommended imaging of chest, LP, and cultures be sent from perirectal I&D. Since that time CT chest results: "Bilateral airspace disease with a small cavitary lesion in the right upper lobe. Findings compatible with pneumonia.Trace right pleural fluid versus right pleural thickening". Also his serum cryptococcal antigenemia titer was elevated raising the possibility of disseminated cryptococcal infection. Including lung involvement. We have been asked to see by infectious disease for bronchoscopy and washings in effort to help determine if CT changes reflect mTB OR possibly pulmonary cryptococcal disease. Initially unable to perform initial bronchoscopy due to hypoxia. 5/28 called by primary for altered LOC. He became very lethargic/obtunded requiring CT and LP, but airway was an issue so PCCM asked to see for ICU transfer and intubation.   SIGNIFICANT EVENTS   STUDIES:  CT head 5/24: nml CT chest 5/24: Bilateral airspace disease with a small cavitary lesion in the right upper lobe. Findings compatible with pneumonia. Trace right pleural fluid versus right pleural thickening. CT abd/pelvis 5/23: Edema tracking along the right side of the gluteal cleft is similar in appearance to the prior study. This reflected a subcutaneous abscess on the prior study. This most likely reflects a persistent small subcutaneous abscess. 2. Diffuse soft tissue edema tracking about the anorectal  canal, without additional abscess. Diffuse presacral soft tissue inflammation noted. 3. Scrotal wall edema, worse on the right, with small bilateral Hydroceles. 4. Mild right basilar airspace opacity raises concern for pneumonia. 5. Mild wall thickening along the distal sigmoid colon and rectum, raising concern for proctitis. 6. Enlarged azygoesophageal recess, retroperitoneal, pelvic sidewall and bilateral inguinal nodes seen, measuring up to 1.7 cm in short axis. This likely reflects the underlying infection. 7. Cholelithiasis.  Gallbladder otherwise unremarkable.  Culture data 5/23 BCX2>>> CRYPTOCOCCUS NEOFORMANS  AFB 5/23>> Csf 5/27>>>yeast Surgical wound culture 5/23: few wbc, few gm variable rods>>> Acid fast culture w/ reflexed sensitivities from sputum 5/23>>> afb from sputum 5/23>>> BAL 5/28 >  ABX azith weekly 5/22>>> Ceftriaxone 5/23>>> Flagyl 5/23>>> Amphotericin B 5/23>>> Flucytosine 5/23>>> Valcyclovir 5/24 > Bactrim 5/24 >  Cefepime 5/22>>>5/23 vanc 5/23 only  SUBJECTIVE/OVERNIGHT/INTERVAL HX: Moved to ICU and intubated for altered LOC. FOB done for washings. LP done with opening pressure 53cmH20. Drained to 23cmH2O. Felt to have some neuro improvement at that time. Neurosurgery consulted and planning to take to OR for lumbar drain.  VITAL SIGNS: Temp:  [97.4 F (36.3 C)-101.1 F (38.4 C)] 101.1 F (38.4 C) (05/29 0742) Pulse Rate:  [47-87] 78 (05/29 0800) Resp:  [16-24] 24 (05/29 0800) BP: (113-171)/(75-112) 138/93 (05/29 0800) SpO2:  [94 %-100 %] 94 % (05/29 0800) FiO2 (%):  [50 %-100 %] 50 % (05/29 0731)  PHYSICAL EXAMINATION:  General:  Young male on vent. Normal body habitus, multiple tattoos  Neuro:  sedated HEENT:  /AT, ETT, No JVD, PERRL Cardiovascular:  RRR, no MRG Lungs:  Coarse bilateral Abdomen:  Soft, non-distended Musculoskeletal:  No acute deformity  Skin:  Intact, MMM, Dry flaky skin face.    PULMONARY  Recent Labs Lab  12/23/16 1310 12/26/16 2028  PHART 7.406 7.460*  PCO2ART 31.9* 31.8*  PO2ART 72.8* 348.0*  HCO3 19.6* 22.6  TCO2  --  24  O2SAT 94.9 100.0    CBC  Recent Labs Lab 12/25/16 0345 12/26/16 0230 12/26/16 1857 12/27/16 0524  HGB 7.5* 6.9* 9.7* 9.7*  HCT 24.1* 22.3* 30.0* 29.3*  WBC 8.7 6.9  --  9.7  PLT 79* 108*  --  146*    COAGULATION  Recent Labs Lab 12/22/16 1125  INR 1.20    CARDIAC  No results for input(s): TROPONINI in the last 168 hours. No results for input(s): PROBNP in the last 168 hours.   CHEMISTRY  Recent Labs Lab 12/23/16 0507 12/24/16 0249 12/25/16 0345 12/26/16 0230 12/27/16 0524  NA 129* 133* 136 136 137  K 4.7 3.3* 4.1 3.3* 3.7  CL 101 109 112* 112* 110  CO2 21* 19* 18* 19* 19*  GLUCOSE 73 104* 84 86 108*  BUN _0 <5* <5*  CREATININE 1.12 1.05 0.82 0.88 0.99  CALCIUM 7.5* 7.5* 7.9* 8.0* 8.4*  MG 1.3* 2.3 1.7 1.8 1.5*  PHOS  --   --  2.2*  --   --    Estimated Creatinine Clearance: 97.6 mL/min (by C-G formula based on SCr of 0.99 mg/dL).   LIVER  Recent Labs Lab 12/20/16 1947 12/22/16 0658 12/22/16 1125 12/27/16 0524  AST 139* 154*  --  36  ALT 62 89*  --  57  ALKPHOS 76 82  --  144*  BILITOT 1.3* 0.8  --  0.7  PROT 8.4* 7.7  --  8.5*  ALBUMIN 2.6* 2.2*  --  2.3*  INR  --   --  1.20  --      INFECTIOUS  Recent Labs Lab 12/20/16 2201 12/22/16 2053 12/23/16 1124 12/23/16 1400 12/24/16 0249 12/25/16 0345  LATICACIDVEN 1.14 1.5  --  1.3  --   --   PROCALCITON  --   --  10.72  --  8.72 4.24     ENDOCRINE CBG (last 3)   Recent Labs  12/27/16 0021 12/27/16 0337 12/27/16 0737  GLUCAP 93 107* 101*     IMAGING x48h  - image(s) personally visualized  -   highlighted in bold Dg Abd 1 View  Result Date: 12/26/2016 CLINICAL DATA:  31 year old male status post intubation and enteric tube placement. EXAM: PORTABLE CHEST 1 VIEW COMPARISON:  Chest radiograph dated 12/23/2016 and chest CT dated 12/21/2016  FINDINGS: There has been interval placement of an endotracheal tube with tip approximately 4.5 cm above the carina. An enteric tube extends below the diaphragm with tip over the right L4 pedicle likely in the distal stomach. The side port of the enteric tube is likely located in the gastric body. There is a small right pleural effusion, increased from prior study. There is associated partial atelectasis of the right lung base. Right hilar prominence as seen on the prior radiograph. There is no pneumothorax. The cardiac silhouette is within normal limits. There is paucity of the small bowel air. Air is noted throughout the colon. There is no bowel dilatation or evidence of obstruction. A 12 mm linear radiopaque density to the right of L2-L3 disc may represent calcification or surgical clip. No free air identified. The osseous structures and soft tissues appear unremarkable IMPRESSION: 1. Endotracheal tube above the carina and enteric tube the tip  likely in the distal stomach. 2. Right-sided pleural effusion, increased compared to prior study. There is associated compressive atelectasis versus infiltrate of the right lung base. 3. Right hilar prominence, likely related to underlying lymphadenopathy. 4. No evidence of bowel obstruction. Electronically Signed   By: Anner Crete M.D.   On: 12/26/2016 20:01   Ct Head Wo Contrast  Result Date: 12/26/2016 CLINICAL DATA:  Meningitis. EXAM: CT HEAD WITHOUT CONTRAST TECHNIQUE: Contiguous axial images were obtained from the base of the skull through the vertex without intravenous contrast. COMPARISON:  CT scan of Dec 23, 2016. FINDINGS: Brain: No evidence of acute infarction, hemorrhage, hydrocephalus, extra-axial collection or mass lesion/mass effect. Vascular: No hyperdense vessel or unexpected calcification. Skull: Normal. Negative for fracture or focal lesion. Sinuses/Orbits: No acute finding. Other: None. IMPRESSION: Normal head CT. Electronically Signed   By:  Marijo Conception, M.D.   On: 12/26/2016 18:37   Mr Jeri Cos BT Contrast  Result Date: 12/27/2016 CLINICAL DATA:  Worsening headache and decreased responsiveness 4 2 days. Of time did. History of cryptococcal meningitis. EXAM: MRI HEAD WITHOUT AND WITH CONTRAST TECHNIQUE: Multiplanar, multiecho pulse sequences of the brain and surrounding structures were obtained without and with intravenous contrast. CONTRAST:  42m MULTIHANCE GADOBENATE DIMEGLUMINE 529 MG/ML IV SOLN COMPARISON:  CT HEAD Dec 26, 2016 and Dec 23, 2016 FINDINGS: INTRACRANIAL CONTENTS: Faint reduced diffusion and decreased ADC values within bilateral inferior basal ganglia associated with T2 bright signal and mild leptomeningeal enhancement. Favor FLAIR T2 hyperintense signal within the dentate nuclei. No susceptibility artifact to suggest hemorrhage. The ventricles and sulci are normal for patient's age. No mass mass effect. No abnormal extra-axial fluid collections. No extra-axial masses. VASCULAR: Normal major intracranial vascular flow voids present at skull base. SKULL AND UPPER CERVICAL SPINE: No abnormal sellar expansion. No suspicious calvarial bone marrow signal. Craniocervical junction maintained. SINUSES/ORBITS: Mild paranasal sinus mucosal thickening. Small bilateral mastoid effusions. The included ocular globes and orbital contents are non-suspicious. OTHER: Life-support lines in place. 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 Electronically Signed   By: CElon AlasM.D.   On: 12/27/2016 03:21   Dg Chest Port 1 View  Result Date: 12/27/2016 CLINICAL DATA:  Respiratory failure. EXAM: PORTABLE CHEST 1 VIEW COMPARISON:  12/26/2016 FINDINGS: Endotracheal tube terminates 3 cm above the carina. Enteric tube courses into the left upper abdomen with tip not imaged. Cardiomediastinal silhouette is unchanged. Prominence of the right  paratracheal and right hilar soft tissues is again noted corresponding to lymphadenopathy demonstrated on recent CT. There is likely a persistent small right pleural effusion, although it is less conspicuous than on the prior radiograph. Interstitial and airspace opacities in the mid and lower lungs bilaterally have increased. No pneumothorax is identified. IMPRESSION: 1. Increased bilateral airspace disease concerning for pneumonia. 2. Small right pleural effusion. Electronically Signed   By: ALogan BoresM.D.   On: 12/27/2016 07:27   Dg Chest Port 1 View  Result Date: 12/26/2016 CLINICAL DATA:  31year old male status post intubation and enteric tube placement. EXAM: PORTABLE CHEST 1 VIEW COMPARISON:  Chest radiograph dated 12/23/2016 and chest CT dated 12/21/2016 FINDINGS: There has been interval placement of an endotracheal tube with tip approximately 4.5 cm above the carina. An enteric tube extends below the diaphragm with tip over the right L4 pedicle likely in the distal stomach. The side port of the enteric tube is likely located in the gastric body. There  is a small right pleural effusion, increased from prior study. There is associated partial atelectasis of the right lung base. Right hilar prominence as seen on the prior radiograph. There is no pneumothorax. The cardiac silhouette is within normal limits. There is paucity of the small bowel air. Air is noted throughout the colon. There is no bowel dilatation or evidence of obstruction. A 12 mm linear radiopaque density to the right of L2-L3 disc may represent calcification or surgical clip. No free air identified. The osseous structures and soft tissues appear unremarkable IMPRESSION: 1. Endotracheal tube above the carina and enteric tube the tip likely in the distal stomach. 2. Right-sided pleural effusion, increased compared to prior study. There is associated compressive atelectasis versus infiltrate of the right lung base. 3. Right hilar  prominence, likely related to underlying lymphadenopathy. 4. No evidence of bowel obstruction. Electronically Signed   By: Anner Crete M.D.   On: 12/26/2016 20:01    DISCUSSION: 31 year old male with history HIV/AIDS [facility] fevers with known perirectal abscess. 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 5/28 worsening mental status requiring intubation for LP and CT. PCCM to assume care. Pressure on LP elevated and drained. To OR today for lumbar drain placement. Will remain intubated for now.   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) Hypokalemia  P:   Monitor BMP Fluid restrict  GASTROINTESTINAL A:   No acute issues  P:   NPO Protonix  HEMATOLOGIC A:   Anemia, chronic (Hgb at baseline)  P:  Follow CBC SQ heparin SCDs  INFECTIOUS A:   Disseminated cryptococcus, including meningitis Cavitary PNA Rule out TB Perirectal abscess HIV/AIDS (CD4 count 21) Genital herpes  P:   ID following Day 6 L-ampho/flycytosine Day 6 acyclovir Day 8 of proph bactrim/weekly azithro Airborne precautions  ENDOCRINE A:   No acute issues  P:   Follow glucose on BMP  NEUROLOGIC A:   Cryptococcal meningitis Encephalitis  P:   RASS goal: -1 to -2 Propofol infusion PRN fentanyl  For lumbar drain today Neurology, neurosurgery following  FAMILY  - Updates: No family available  - Inter-disciplinary family meet or Palliative Care meeting due by:  6/3  Georgann Housekeeper, AGACNP-BC [place] [facility]/Critical Care Pager [phone] or [phone]  12/27/2016 8:47  AM  STAFF NOTE: Linwood Dibbles, MD FACP have personally reviewed patient's available data, including medical history, events of note, physical examination and test results as part of my evaluation. I have discussed with resident/NP and other care providers such as pharmacist, RN and RRT. In addition, I personally evaluated patient and elicited key findings of: on sedation, rass -2, not fc, ronchi rt, abndo soft, tattoos noted, no edema, no abdo pain, no cranial nerve pulsy, presents with AIDS< crypto meningitis without cryptococcoma but now escalating ICP elevation  weith neuro changes requiring intubation, in setting PNA rt / nodule changes c/w crypto PNA/ r/o cavitation unclear etiology?, for OR drain lumbar to reduce pressure, post OR for WUA, BP is tolerating , ABG last reviewed, reduce MV, need to feed and wean post OR, no family at bedside, now predominent rt  sided infiltrarte asp?, ABX per ID The patient is critically ill with multiple organ systems failure and requires high complexity decision making for assessment and support, frequent evaluation and titration of therapies, application of advanced monitoring technologies and extensive interpretation of multiple databases.   Critical Care Time devoted to patient care services described in this note is 35 Minutes. This time reflects time of care of this signee: Merrie Roof, MD FACP. This critical care time does not reflect procedure time, or teaching time or supervisory time of PA/NP/Med student/Med Resident etc but could involve care discussion time. Rest per NP/medical resident whose note is outlined above and that I agree with   Lavon Paganini. Titus Mould, MD, [place] Pgr: [facility] 12/27/2016 9:17 AM

## 2016-12-28 ENCOUNTER — Encounter (HOSPITAL_COMMUNITY): Payer: Self-pay | Admitting: Neurosurgery

## 2016-12-28 ENCOUNTER — Inpatient Hospital Stay (HOSPITAL_COMMUNITY): Payer: Medicaid Other

## 2016-12-28 LAB — QUANTIFERON IN TUBE
QFT TB AG MINUS NIL VALUE: 0.01 [IU]/mL
QUANTIFERON MITOGEN VALUE: 1.2 IU/mL
QUANTIFERON TB AG VALUE: 0.13 IU/mL
QUANTIFERON TB GOLD: NEGATIVE
Quantiferon Nil Value: 0.12 IU/mL

## 2016-12-28 LAB — GLUCOSE, CAPILLARY
GLUCOSE-CAPILLARY: 142 mg/dL — AB (ref 65–99)
GLUCOSE-CAPILLARY: 117 mg/dL — AB (ref 65–99)
Glucose-Capillary: 125 mg/dL — ABNORMAL HIGH (ref 65–99)
Glucose-Capillary: 154 mg/dL — ABNORMAL HIGH (ref 65–99)
Glucose-Capillary: 125 mg/dL — ABNORMAL HIGH (ref 65–99)

## 2016-12-28 LAB — COMPREHENSIVE METABOLIC PANEL
ALK PHOS: 135 U/L — AB (ref 38–126)
ALT: 42 U/L (ref 17–63)
ANION GAP: 5 (ref 5–15)
AST: 30 U/L (ref 15–41)
Albumin: 2.2 g/dL — ABNORMAL LOW (ref 3.5–5.0)
BILIRUBIN TOTAL: 0.4 mg/dL (ref 0.3–1.2)
BUN: 11 mg/dL (ref 6–20)
CALCIUM: 8 mg/dL — AB (ref 8.9–10.3)
CO2: 19 mmol/L — ABNORMAL LOW (ref 22–32)
Chloride: 110 mmol/L (ref 101–111)
Creatinine, Ser: 1.25 mg/dL — ABNORMAL HIGH (ref 0.61–1.24)
GLUCOSE: 146 mg/dL — AB (ref 65–99)
POTASSIUM: 4.3 mmol/L (ref 3.5–5.1)
Sodium: 134 mmol/L — ABNORMAL LOW (ref 135–145)
TOTAL PROTEIN: 8.3 g/dL — AB (ref 6.5–8.1)

## 2016-12-28 LAB — MAGNESIUM
MAGNESIUM: 2 mg/dL (ref 1.7–2.4)
Magnesium: 2.3 mg/dL (ref 1.7–2.4)

## 2016-12-28 LAB — QUANTIFERON TB GOLD ASSAY (BLOOD)

## 2016-12-28 LAB — CBC
HEMATOCRIT: 27.7 % — AB (ref 39.0–52.0)
HEMOGLOBIN: 8.8 g/dL — AB (ref 13.0–17.0)
MCH: 26.9 pg (ref 26.0–34.0)
MCHC: 31.8 g/dL (ref 30.0–36.0)
MCV: 84.7 fL (ref 78.0–100.0)
Platelets: 155 10*3/uL (ref 150–400)
RBC: 3.27 MIL/uL — AB (ref 4.22–5.81)
RDW: 16 % — ABNORMAL HIGH (ref 11.5–15.5)
WBC: 10.1 10*3/uL (ref 4.0–10.5)

## 2016-12-28 LAB — PHOSPHORUS
PHOSPHORUS: 4.4 mg/dL (ref 2.5–4.6)
PHOSPHORUS: 4.1 mg/dL (ref 2.5–4.6)

## 2016-12-28 MED ORDER — SULFAMETHOXAZOLE-TRIMETHOPRIM 200-40 MG/5ML PO SUSP
20.0000 mL | Freq: Every day | ORAL | Status: DC
  Administered 2016-12-29 – 2016-12-30 (×2): 20 mL
  Filled 2016-12-28 (×2): qty 20

## 2016-12-28 MED ORDER — POTASSIUM CHLORIDE 20 MEQ/15ML (10%) PO SOLN
40.0000 meq | Freq: Every day | ORAL | Status: DC
  Administered 2016-12-28 – 2017-01-01 (×5): 40 meq
  Filled 2016-12-28 (×5): qty 30

## 2016-12-28 MED ORDER — SODIUM CHLORIDE 0.9 % IV BOLUS (SEPSIS)
500.0000 mL | INTRAVENOUS | Status: DC
Start: 2016-12-28 — End: 2017-01-02
  Administered 2016-12-28 – 2017-01-02 (×6): 500 mL via INTRAVENOUS

## 2016-12-28 MED ORDER — SODIUM CHLORIDE 0.9 % IV BOLUS (SEPSIS)
500.0000 mL | INTRAVENOUS | Status: DC
  Administered 2016-12-28 – 2017-01-01 (×5): 500 mL via INTRAVENOUS

## 2016-12-28 MED ORDER — SODIUM CHLORIDE 0.9 % IV BOLUS (SEPSIS): 500.0000 mL | INTRAVENOUS | Status: DC

## 2016-12-28 MED ORDER — AMPHOTERICIN B LIPOSOME 50 MG IV SUSR
3.5000 mg/kg | INTRAVENOUS | Status: DC
  Filled 2016-12-28: qty 240

## 2016-12-28 MED ORDER — DEXTROSE 5 % IV SOLN
3.5000 mg/kg | INTRAVENOUS | Status: DC
  Administered 2016-12-28 – 2016-12-30 (×3): 240 mg via INTRAVENOUS
  Filled 2016-12-28 (×4): qty 240

## 2016-12-28 NOTE — Progress Notes (Signed)
LD functioning adequately, draining 20cc Q2 hrs. Exam remains poor, not awake, no spontaneous motor movements. Prognosis appears poor. - Cont LD at 20q2 - Mgmt per primary service/ID

## 2016-12-28 NOTE — Progress Notes (Signed)
Encountered pt's mom in hall, who thanked me again for praying for her son last night. Gave her prayer shawl I was bringing to her. She was moved and will place it on pt's bed. Chaplain available for f/u.    12/28/16 0700  Clinical Encounter Type  Visited With Family  Visit Type Follow-up;Psychological support;Spiritual support;Social support;Critical Care  Referral From [place] A Arine Foley, [place]

## 2016-12-28 NOTE — Progress Notes (Signed)
Sputum sample sent to lab for acid-fast smear per MD order

## 2016-12-28 NOTE — Progress Notes (Signed)
Name: William Pena MRN: [id number] DOB: May 30, 1986    ADMISSION DATE:  12/20/2016 CONSULTATION DATE:  5/24 REFERRING MD :  Baxter Flattery   CHIEF COMPLAINT:  Disseminated cryptococcus  BRIEF  This is a 31 year old make w/ HIV/AIDS (last CD4 1 w/ VL 81K). Just started back on ART 2 months ago however he was incarcerated and reported he was not given his medications. He was admitted on 5/22 for perirectal abscess. During admission he was also noted to have HA, fever, chills, night sweats neck pain and LAN. ID was consulted and recommended imaging of chest, LP, and cultures be sent from perirectal I&D. Since that time CT chest results: "Bilateral airspace disease with a small cavitary lesion in the right upper lobe. Findings compatible with pneumonia.Trace right pleural fluid versus right pleural thickening". Also his serum cryptococcal antigenemia titer was elevated raising the possibility of disseminated cryptococcal infection. Including lung involvement. We have been asked to see by infectious disease for bronchoscopy and washings in effort to help determine if CT changes reflect mTB OR possibly pulmonary cryptococcal disease. Initially unable to perform initial bronchoscopy due to hypoxia. 5/28 called by primary for altered LOC. He became very lethargic/obtunded requiring CT and LP, but airway was an issue so PCCM asked to see for ICU transfer and intubation.   SIGNIFICANT EVENTS   STUDIES:  CT head 5/24: nml CT chest 5/24: Bilateral airspace disease with a small cavitary lesion in the right upper lobe. Findings compatible with pneumonia. Trace right pleural fluid versus right pleural thickening. CT abd/pelvis 5/23: Edema tracking along the right side of the gluteal cleft is similar in appearance to the prior study. This reflected a subcutaneous abscess on the prior study. This most likely reflects a persistent small subcutaneous abscess. 2. Diffuse soft tissue edema tracking about the anorectal  canal, without additional abscess. Diffuse presacral soft tissue inflammation noted. 3. Scrotal wall edema, worse on the right, with small bilateral Hydroceles. 4. Mild right basilar airspace opacity raises concern for pneumonia. 5. Mild wall thickening along the distal sigmoid colon and rectum, raising concern for proctitis. 6. Enlarged azygoesophageal recess, retroperitoneal, pelvic sidewall and bilateral inguinal nodes seen, measuring up to 1.7 cm in short axis. This likely reflects the underlying infection. 7. Cholelithiasis.  Gallbladder otherwise unremarkable.  Culture data 5/23 BCX2>>> CRYPTOCOCCUS NEOFORMANS  AFB 5/28>> negative Csf 5/27>>>yeast Surgical wound culture 5/23: few wbc, few gm variable rods>>> Acid fast culture w/ reflexed sensitivities from sputum 5/23>>> afb from sputum 5/23>>> BAL 5/28 > BAL fungal 5/28 >> negative so far >>  BAL PCP 5/28 >> negative  ABX azith weekly 5/22>>> Ceftriaxone 5/23>>> Flagyl 5/23>>> Amphotericin B 5/23>>> Flucytosine 5/23>>> Valcyclovir 5/24 > Bactrim 5/24 >  Cefepime 5/22>>>5/23 vanc 5/23 only  SUBJECTIVE/OVERNIGHT/INTERVAL HX: Moved to ICU and intubated for altered LOC. FOB done for washings. LP done with opening pressure 53cmH20. Drained to 23cmH2O. Felt to have some neuro improvement at that time. Neurosurgery consulted and placed lumbar drain 5/29  VITAL SIGNS: Temp:  [98 F (36.7 C)-98.9 F (37.2 C)] 98 F (36.7 C) (05/30 0800) Pulse Rate:  [51-74] 59 (05/30 1000) Resp:  [12-19] 15 (05/30 1000) BP: (101-123)/(64-84) 120/80 (05/30 1000) SpO2:  [100 %] 100 % (05/30 1000) FiO2 (%):  [40 %-50 %] 40 % (05/30 0800) Weight:  [66.5 kg (146 lb 9.7 oz)-67.6 kg (149 lb 0.5 oz)] 67.6 kg (149 lb 0.5 oz) (05/30 0407)  PHYSICAL EXAMINATION:  General:  Ill-appearing man, ventilated Neuro: Sedated, some grimace  with stimulation, not following commands HEENT: ET tube in place, no JVD, pupils equal Cardiovascular:  Regular, no  murmur Lungs:  Coarse bilaterally, no wheezing Abdomen: Soft, benign, positive bowel sounds Musculoskeletal:  No deformities Skin: No apparent rashes   PULMONARY  Recent Labs Lab 12/23/16 1310 12/26/16 2028  PHART 7.406 7.460*  PCO2ART 31.9* 31.8*  PO2ART 72.8* 348.0*  HCO3 19.6* 22.6  TCO2  --  24  O2SAT 94.9 100.0    CBC  Recent Labs Lab 12/26/16 0230 12/26/16 1857 12/27/16 0524 12/28/16 0512  HGB 6.9* 9.7* 9.7* 8.8*  HCT 22.3* 30.0* 29.3* 27.7*  WBC 6.9  --  9.7 10.1  PLT 108*  --  146* 155    COAGULATION  Recent Labs Lab 12/22/16 1125  INR 1.20    CARDIAC  No results for input(s): TROPONINI in the last 168 hours. No results for input(s): PROBNP in the last 168 hours.   CHEMISTRY  Recent Labs Lab 12/24/16 0249 12/25/16 0345 12/26/16 0230 12/27/16 0524 12/27/16 1810 12/28/16 0512  NA 133* 136 136 137  --  134*  K 3.3* 4.1 3.3* 3.7  --  4.3  CL 109 112* 112* 110  --  110  CO2 19* 18* 19* 19*  --  19*  GLUCOSE 104* 84 86 108*  --  146*  BUN 10 6 <5* <5*  --  11  CREATININE 1.05 0.82 0.88 0.99  --  1.25*  CALCIUM 7.5* 7.9* 8.0* 8.4*  --  8.0*  MG 2.3 1.7 1.8 1.5* 3.1* 2.3  PHOS  --  2.2*  --   --  5.1* 4.4   Estimated Creatinine Clearance: 77.3 mL/min (A) (by C-G formula based on SCr of 1.25 mg/dL (H)).   LIVER  Recent Labs Lab 12/22/16 0658 12/22/16 1125 12/27/16 0524 12/28/16 0512  AST 154*  --  36 30  ALT 89*  --  57 42  ALKPHOS 82  --  144* 135*  BILITOT 0.8  --  0.7 0.4  PROT 7.7  --  8.5* 8.3*  ALBUMIN 2.2*  --  2.3* 2.2*  INR  --  1.20  --   --      INFECTIOUS  Recent Labs Lab 12/22/16 2053 12/23/16 1124 12/23/16 1400 12/24/16 0249 12/25/16 0345  LATICACIDVEN 1.5  --  1.3  --   --   PROCALCITON  --  10.72  --  8.72 4.24     ENDOCRINE CBG (last 3)   Recent Labs  12/27/16 2347 12/28/16 0352 12/28/16 0830  GLUCAP 138* 125* 142*     IMAGING x48h  - image(s) personally visualized  -   highlighted in  bold Dg Abd 1 View  Result Date: 12/26/2016 CLINICAL DATA:  31 year old male status post intubation and enteric tube placement. EXAM: PORTABLE CHEST 1 VIEW COMPARISON:  Chest radiograph dated 12/23/2016 and chest CT dated 12/21/2016 FINDINGS: There has been interval placement of an endotracheal tube with tip approximately 4.5 cm above the carina. An enteric tube extends below the diaphragm with tip over the right L4 pedicle likely in the distal stomach. The side port of the enteric tube is likely located in the gastric body. There is a small right pleural effusion, increased from prior study. There is associated partial atelectasis of the right lung base. Right hilar prominence as seen on the prior radiograph. There is no pneumothorax. The cardiac silhouette is within normal limits. There is paucity of the small bowel air. Air is noted throughout the  colon. There is no bowel dilatation or evidence of obstruction. A 12 mm linear radiopaque density to the right of L2-L3 disc may represent calcification or surgical clip. No free air identified. The osseous structures and soft tissues appear unremarkable IMPRESSION: 1. Endotracheal tube above the carina and enteric tube the tip likely in the distal stomach. 2. Right-sided pleural effusion, increased compared to prior study. There is associated compressive atelectasis versus infiltrate of the right lung base. 3. Right hilar prominence, likely related to underlying lymphadenopathy. 4. No evidence of bowel obstruction. Electronically Signed   By: Anner Crete M.D.   On: 12/26/2016 20:01   Ct Head Wo Contrast  Result Date: 12/27/2016 CLINICAL DATA:  Cryptococcal meningitis. Unresponsive. Lumbar drain placement earlier today. EXAM: CT HEAD WITHOUT CONTRAST TECHNIQUE: Contiguous axial images were obtained from the base of the skull through the vertex without intravenous contrast. COMPARISON:  Head CT 12/26/2016 and MRI 12/27/2016 FINDINGS: Brain: There is new  abnormal hypoattenuation involving the left caudate head as well as new or worsening hypoattenuation involving the anterior left lentiform nucleus. There is at most subtle hypoattenuation inferiorly in the right basal ganglia. No acute cortical infarct, intracranial hemorrhage, midline shift, or extra-axial fluid collection is seen. The ventricles and sulci are normal in size. Vascular: No hyperdense vessel. Skull: No fracture or focal osseous lesion. Sinuses/Orbits: Minimal sphenoid sinus mucosal thickening. Clear mastoid air cells. Unremarkable orbits. Other: None. IMPRESSION: Abnormal low-density in the left basal ganglia which may reflect worsening infection or acute infarction. Electronically Signed   By: Logan Bores M.D.   On: 12/27/2016 12:13   Ct Head Wo Contrast  Result Date: 12/26/2016 CLINICAL DATA:  Meningitis. EXAM: CT HEAD WITHOUT CONTRAST TECHNIQUE: Contiguous axial images were obtained from the base of the skull through the vertex without intravenous contrast. COMPARISON:  CT scan of Dec 23, 2016. FINDINGS: Brain: No evidence of acute infarction, hemorrhage, hydrocephalus, extra-axial collection or mass lesion/mass effect. Vascular: No hyperdense vessel or unexpected calcification. Skull: Normal. Negative for fracture or focal lesion. Sinuses/Orbits: No acute finding. Other: None. IMPRESSION: Normal head CT. Electronically Signed   By: Marijo Conception, M.D.   On: 12/26/2016 18:37   Mr Brain Wo Contrast  Result Date: 12/27/2016 CLINICAL DATA:  Meningitis. Placement of lumbar drain earlier today with subsequent unresponsiveness. Assess for anesthesia complication or other unexpected finding. EXAM: MRI HEAD WITHOUT CONTRAST TECHNIQUE: Multiplanar, multiecho pulse sequences of the brain and surrounding structures were obtained without intravenous contrast. COMPARISON:  CT same day.  MRI same day. FINDINGS: Brain: Diffusion imaging shows persistent areas of acute infarction at the inferior basal  ganglia bilaterally. There is new acute infarction affecting the caudate and putamen on the left, not present earlier today. Areas of acute infarction of the frontoparietal cortical and subcortical brain are probably progressive as well. No hydrocephalus. No extra-axial collection. Ventricular size is stable. Vascular: Flow present in the major vessels at the base of the brain. Skull and upper cervical spine: Negative Sinuses/Orbits: Sinuses are clear. Bilateral mastoid effusions again demonstrated. Other: Hypercellular marrow pattern as seen previously. 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 Worsening of small vessel occlusive infarctions along the superior surface of the brain an underlying white matter as well. Findings are consistent with progressive complication of meningitis. Electronically Signed   By: Nelson Chimes M.D.   On: 12/27/2016 13:53   Mr  Brain W Wo Contrast  Result Date: 12/27/2016 CLINICAL DATA:  Worsening headache and decreased responsiveness 4 2 days. Of time did. History of cryptococcal meningitis. EXAM: MRI HEAD WITHOUT AND WITH CONTRAST TECHNIQUE: Multiplanar, multiecho pulse sequences of the brain and surrounding structures were obtained without and with intravenous contrast. CONTRAST:  70m MULTIHANCE GADOBENATE DIMEGLUMINE 529 MG/ML IV SOLN COMPARISON:  CT HEAD Dec 26, 2016 and Dec 23, 2016 FINDINGS: INTRACRANIAL CONTENTS: Faint reduced diffusion and decreased ADC values within bilateral inferior basal ganglia associated with T2 bright signal and mild leptomeningeal enhancement. Favor FLAIR T2 hyperintense signal within the dentate nuclei. No susceptibility artifact to suggest hemorrhage. The ventricles and sulci are normal for patient's age. No mass mass effect. No abnormal extra-axial fluid collections. No extra-axial masses.  VASCULAR: Normal major intracranial vascular flow voids present at skull base. SKULL AND UPPER CERVICAL SPINE: No abnormal sellar expansion. No suspicious calvarial bone marrow signal. Craniocervical junction maintained. SINUSES/ORBITS: Mild paranasal sinus mucosal thickening. Small bilateral mastoid effusions. The included ocular globes and orbital contents are non-suspicious. OTHER: Life-support lines in place. 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 Electronically Signed   By: CElon AlasM.D.   On: 12/27/2016 03:21   Dg Lumbar Spine 1 View  Result Date: 12/27/2016 CLINICAL DATA:  Placement of lumbar drain. EXAM: LUMBAR SPINE - 1 VIEW; DG C-ARM 61-120 MIN COMPARISON:  None. FLUOROSCOPY TIME:  6 seconds FINDINGS: A single image demonstrates a device at what appears to be the L4-5 level just to the right of midline. IMPRESSION: Device at L4-5 just to the right of midline. Electronically Signed   By: JLorriane ShireM.D.   On: 12/27/2016 09:59   Dg Chest Port 1 View  Result Date: 12/28/2016 CLINICAL DATA:  Pneumonia, HIV, perirectal abscess.  Former smoker. EXAM: PORTABLE CHEST 1 VIEW COMPARISON:  Chest x-ray of Dec 27, 2016 FINDINGS: The lungs are well-expanded. Patchy interstitial densities have improved bilaterally. The retrocardiac region is less dense and right lower lobe infiltrate has improved. There is no significant pleural effusion. The cardiac silhouette is top-normal in size. The pulmonary vascularity is less prominent. The endotracheal tube tip measures 4 cm above the carina. The esophagogastric tube tip in proximal port project below the inferior margin of the image. IMPRESSION: Improved appearance of the lung parenchyma consistent with resolving pneumonia. The support tubes are in reasonable position. Electronically Signed   By: David  JMartiniqueM.D.   On: 12/28/2016 07:34   Dg Chest  Port 1 View  Result Date: 12/27/2016 CLINICAL DATA:  Respiratory failure. EXAM: PORTABLE CHEST 1 VIEW COMPARISON:  12/26/2016 FINDINGS: Endotracheal tube terminates 3 cm above the carina. Enteric tube courses into the left upper abdomen with tip not imaged. Cardiomediastinal silhouette is unchanged. Prominence of the right paratracheal and right hilar soft tissues is again noted corresponding to lymphadenopathy demonstrated on recent CT. There is likely a persistent small right pleural effusion, although it is less conspicuous than on the prior radiograph. Interstitial and airspace opacities in the mid and lower lungs bilaterally have increased. No pneumothorax is identified. IMPRESSION: 1. Increased bilateral airspace disease concerning for pneumonia. 2. Small right pleural effusion. Electronically Signed   By: ALogan BoresM.D.   On: 12/27/2016 07:27   Dg Chest Port 1 View  Result Date: 12/26/2016 CLINICAL DATA:  31year old male status post intubation and enteric tube placement. EXAM: PORTABLE CHEST 1 VIEW COMPARISON:  Chest radiograph dated 12/23/2016  and chest CT dated 12/21/2016 FINDINGS: There has been interval placement of an endotracheal tube with tip approximately 4.5 cm above the carina. An enteric tube extends below the diaphragm with tip over the right L4 pedicle likely in the distal stomach. The side port of the enteric tube is likely located in the gastric body. There is a small right pleural effusion, increased from prior study. There is associated partial atelectasis of the right lung base. Right hilar prominence as seen on the prior radiograph. There is no pneumothorax. The cardiac silhouette is within normal limits. There is paucity of the small bowel air. Air is noted throughout the colon. There is no bowel dilatation or evidence of obstruction. A 12 mm linear radiopaque density to the right of L2-L3 disc may represent calcification or surgical clip. No free air identified. The osseous  structures and soft tissues appear unremarkable IMPRESSION: 1. Endotracheal tube above the carina and enteric tube the tip likely in the distal stomach. 2. Right-sided pleural effusion, increased compared to prior study. There is associated compressive atelectasis versus infiltrate of the right lung base. 3. Right hilar prominence, likely related to underlying lymphadenopathy. 4. No evidence of bowel obstruction. Electronically Signed   By: Anner Crete M.D.   On: 12/26/2016 20:01   Dg C-arm 1-60 Min  Result Date: 12/27/2016 CLINICAL DATA:  Placement of lumbar drain. EXAM: LUMBAR SPINE - 1 VIEW; DG C-ARM 61-120 MIN COMPARISON:  None. FLUOROSCOPY TIME:  6 seconds FINDINGS: A single image demonstrates a device at what appears to be the L4-5 level just to the right of midline. IMPRESSION: Device at L4-5 just to the right of midline. Electronically Signed   By: Lorriane Shire M.D.   On: 12/27/2016 09:59    DISCUSSION: 31 year old male with history HIV/AIDS admitted [facility]/22 from [facility] for fevers with known perirectal abscess. 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 5/28 worsening mental status requiring intubation for LP and CT. PCCM to assume care. Pressure on LP elevated and drained. 5/29 lumbar drain placement. Progressive changes on MRI brain 5/29, ? Ischemic infarct BG due to inflammation / cryptococcal infection.   ASSESSMENT / PLAN:  PULMONARY A: Acute hypoxemic respiratory failure Bilateral pulmonary infiltrates - disseminated cryptococcus RUL infiltrate, ? Small area cavitation > PNA  P:   Continue PRVC, could consider pressure support but he does not have the mental status for extubation Follow chest x-ray VAP prevention order set Suspect that his pneumonia/infiltrates are either cryptococcus or bacterial. PCP DFA was negative. Continue current empiric coverage. 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) Hypokalemia  P:   Follow BMP, urine output Adjust amphotericin dosing per renal function, pharmacy following   GASTROINTESTINAL A:   No acute issues  P:   Continue nothing by mouth PPI prophylaxis  HEMATOLOGIC A:   Anemia, chronic (Hgb at baseline)  P:  Follow CBC SQ heparin SCDs  INFECTIOUS A:   Disseminated cryptococcus, including meningitis, possibly pneumonia Bilateral pulmonary infiltrates, pneumonia with micronodular cavitation No current evidence tuberculosis based on culture data but at risk No current evidence PCP, but at risk Perirectal abscess, drained HIV/AIDS (CD4 count 21) Genital herpes  P:   ID following Day 7 L-ampho/flycytosine, note acute renal failure, may need to be adjusted Day 7 acyclovir Day 9 of proph bactrim/weekly azithro Airborne precautions, possibly  clear based on his negative AFB cultures  ENDOCRINE A:   No acute issues  P:   Follow glucose on BMP  NEUROLOGIC A:   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  P:   RASS goal: -1  Begin to decrease sedating medications to assess mental status now that lumbar drain has been placed MRI concerning for possible irreversible basal ganglial injury Continue lumbar drainage, CSF, per neurosurgery recommendations Appreciate neurology assistance Continue to treat underlying cause, cryptococcus   FAMILY  - Updates: Status reviewed with the patient's mother at bedside on 5/30  - Inter-disciplinary family meet or Palliative Care meeting due by:  6/3  Independent CC time 9 minutes  Baltazar Apo, MD, PhD 12/28/2016, 11:05 AM [facility] [phone] or if no answer [phone]

## 2016-12-28 NOTE — Progress Notes (Signed)
[facility]    Date of Admission:  12/20/2016   Total days of antibiotics 9 Day 7 L-ampho/flycytosine Day 7 acyclovir  OI proph bactrim/weekly azithro   ID: William Pena is a 31 y.o. 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   Principal Problem:   Perirectal abscess Active Problems:   Chronic pain   AIDS (acquired immune deficiency syndrome) (HCC)   Hyponatremia   Normocytic anemia   Elevated LFTs   Pulmonary infiltrates   Hypomagnesemia   Encephalopathy acute   SIRS (systemic inflammatory response syndrome) (HCC)   Acute respiratory failure (HCC)   Rectal abscess   Cryptococcal meningoencephalitis (HCC)   Cryptococcosis (HCC)   Cavitary pneumonia   Diffuse lymphadenopathy   Drug rash   Elevated intracranial pressure   HIV disease (HCC)   Lymphadenopathy of head and neck    Subjective: Intubated. Responds non-purposefully with what appears to be decerebrate posturing and ETT biting. On minimal fentanyl infusion for now. No family at bedside.   Medications:  . [START ON 12/30/2016] azithromycin  1,200 mg Per Tube Q Fri  . chlorhexidine gluconate (MEDLINE KIT)  15 mL Mouth Rinse BID  . flucytosine  1,500 mg Per Tube Q6H  . lidocaine (PF)  5 mL Other Once  . mouth rinse  15 mL Mouth Rinse 10 times per day  . potassium chloride  40 mEq Per Tube Daily  . sulfamethoxazole-trimethoprim  20 mL Per Tube Q12H  . valACYclovir  1,000 mg Per Tube BID    Objective: Vital signs in last 24 hours: Temp:  [98 F (36.7 C)-98.9 F (37.2 C)] 98 F (36.7 C) (05/30 0800) Pulse  Rate:  [51-74] 71 (05/30 0722) Resp:  [12-20] 19 (05/30 0722) BP: (101-123)/(64-84) 119/67 (05/30 0722) SpO2:  [100 %] 100 % (05/30 0832) FiO2 (%):  [40 %-50 %] 40 % (05/30 0800) Weight:  [146 lb 9.7 oz (66.5 kg)-149 lb 0.5 oz (67.6 kg)] 149 lb 0.5 oz (67.6 kg) (05/30 0407)   General appearance: unresponsive on ventilator without sedation. Infrequent posturing movements  Eyes: Pupils 4/3 with very sluggish to no response to light. Left-deviated gaze on assessment.  Cardio: regular rate and rhythm, S1, S2 normal, no murmur, click, rub or gallop GI: soft, non-tender; bowel sounds normal; no masses,  no organomegaly Male genitalia: herpetic lesions appear crusted to palpation, however visually not much improvement.   Rectal abscesses - wound clean and dry. Packing with little serous drainage.  Pulses: 2+ and symmetric Skin: Skin color, texture, turgor normal. No rashes or lesions or rash improved to face since stopping Amox-Clav Lymph nodes: + cervical, inguinal, axillary lymphadenopathy Neurologic: unresponsive   Lab Results  Recent Labs  12/27/16 0524 12/28/16 0512  WBC 9.7 10.1  HGB 9.7* 8.8*  HCT 29.3* 27.7*  NA 137 134*  K 3.7 4.3  CL 110 110  CO2 19* 19*  BUN <5* 11  CREATININE 0.99 1.25*   Liver Panel  Recent Labs  12/27/16 0524 12/28/16 0512  PROT 8.5* 8.3*  ALBUMIN 2.3* 2.2*  AST 36 30  ALT 57 42  ALKPHOS 144* 135*  BILITOT 0.7 0.4  BILIDIR 0.2  --   IBILI 0.5  --    Sedimentation Rate No results for input(s): ESRSEDRATE in the last 72 hours. C-Reactive Protein No results for input(s): CRP in the last 72 hours.  Microbiology:  Studies/Results: Dg Abd 1 View  Result Date: 12/26/2016 CLINICAL DATA:  31 year old male status post intubation and enteric tube placement. EXAM: PORTABLE CHEST 1 VIEW COMPARISON:  Chest radiograph dated 12/23/2016 and chest CT dated 12/21/2016 FINDINGS: There has been interval placement of an endotracheal tube with tip  approximately 4.5 cm above the carina. An enteric tube extends below the diaphragm with tip over the right L4 pedicle likely in the distal stomach. The side port of the enteric tube is likely located in the gastric body. There is a small right pleural effusion, increased from prior study. There is associated partial atelectasis of the right lung base. Right hilar prominence as seen on the prior radiograph. There is no pneumothorax. The cardiac silhouette is within normal limits. There is paucity of the small bowel air. Air is noted throughout the colon. There is no bowel dilatation or evidence of obstruction. A 12 mm linear radiopaque density to the right of L2-L3 disc may represent calcification or surgical clip. No free air identified. The osseous structures and soft tissues appear unremarkable IMPRESSION: 1. Endotracheal tube above the carina and enteric tube the tip likely in the distal stomach. 2. Right-sided pleural effusion, increased compared to prior study. There is associated compressive atelectasis versus infiltrate of the right lung base. 3. Right hilar prominence, likely related to underlying lymphadenopathy. 4. No evidence of bowel obstruction. Electronically Signed   By: Anner Crete M.D.   On: 12/26/2016 20:01   Ct Head Wo Contrast  Result Date: 12/27/2016 CLINICAL DATA:  Cryptococcal meningitis. Unresponsive. Lumbar drain placement earlier today. EXAM: CT HEAD WITHOUT CONTRAST TECHNIQUE: Contiguous axial images were obtained from the base of the skull through the vertex without intravenous contrast. COMPARISON:  Head CT 12/26/2016 and MRI 12/27/2016 FINDINGS: Brain: There is new abnormal hypoattenuation involving the left caudate head as well as new or worsening hypoattenuation involving the anterior left lentiform nucleus. There is at most subtle hypoattenuation inferiorly in the right basal ganglia. No acute cortical infarct, intracranial hemorrhage, midline shift, or extra-axial fluid  collection is seen. The ventricles and sulci are normal in size. Vascular: No hyperdense vessel. Skull: No fracture or focal osseous lesion. Sinuses/Orbits: Minimal sphenoid sinus mucosal thickening. Clear mastoid air cells. Unremarkable orbits. Other: None. IMPRESSION: Abnormal low-density in the left basal ganglia which may reflect worsening infection or acute infarction. Electronically Signed   By: Logan Bores M.D.   On: 12/27/2016 12:13   Ct Head Wo Contrast  Result Date: 12/26/2016 CLINICAL DATA:  Meningitis. EXAM: CT HEAD WITHOUT CONTRAST TECHNIQUE: Contiguous axial images were obtained from the base of the skull through the vertex without intravenous contrast. COMPARISON:  CT scan of Dec 23, 2016. FINDINGS: Brain: No evidence of acute infarction, hemorrhage, hydrocephalus, extra-axial collection or mass lesion/mass effect. Vascular: No hyperdense vessel or unexpected calcification. Skull: Normal. Negative for fracture or focal lesion. Sinuses/Orbits: No acute finding. Other: None. IMPRESSION: Normal head CT. Electronically Signed   By: Marijo Conception, M.D.   On: 12/26/2016 18:37   Mr Brain Wo Contrast  Result Date: 12/27/2016 CLINICAL DATA:  Meningitis. Placement of lumbar drain earlier today with subsequent unresponsiveness. Assess for anesthesia complication or other unexpected finding. EXAM: MRI HEAD WITHOUT CONTRAST TECHNIQUE: Multiplanar, multiecho pulse sequences of the brain and surrounding structures were obtained without intravenous contrast. COMPARISON:  CT same day.  MRI same day. FINDINGS: Brain: Diffusion imaging shows persistent areas of acute infarction at the inferior basal ganglia bilaterally. There is new acute infarction affecting  the caudate and putamen on the left, not present earlier today. Areas of acute infarction of the frontoparietal cortical and subcortical brain are probably progressive as well. No hydrocephalus. No extra-axial collection. Ventricular size is stable.  Vascular: Flow present in the major vessels at the base of the brain. Skull and upper cervical spine: Negative Sinuses/Orbits: Sinuses are clear. Bilateral mastoid effusions again demonstrated. Other: Hypercellular marrow pattern as seen previously. 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 Worsening of small vessel occlusive infarctions along the superior surface of the brain an underlying white matter as well. Findings are consistent with progressive complication of meningitis. Electronically Signed   By: Nelson Chimes M.D.   On: 12/27/2016 13:53   Mr Jeri Cos UX Contrast  Result Date: 12/27/2016 CLINICAL DATA:  Worsening headache and decreased responsiveness 4 2 days. Of time did. History of cryptococcal meningitis. EXAM: MRI HEAD WITHOUT AND WITH CONTRAST TECHNIQUE: Multiplanar, multiecho pulse sequences of the brain and surrounding structures were obtained without and with intravenous contrast. CONTRAST:  61m MULTIHANCE GADOBENATE DIMEGLUMINE 529 MG/ML IV SOLN COMPARISON:  CT HEAD Dec 26, 2016 and Dec 23, 2016 FINDINGS: INTRACRANIAL CONTENTS: Faint reduced diffusion and decreased ADC values within bilateral inferior basal ganglia associated with T2 bright signal and mild leptomeningeal enhancement. Favor FLAIR T2 hyperintense signal within the dentate nuclei. No susceptibility artifact to suggest hemorrhage. The ventricles and sulci are normal for patient's age. No mass mass effect. No abnormal extra-axial fluid collections. No extra-axial masses. VASCULAR: Normal major intracranial vascular flow voids present at skull base. SKULL AND UPPER CERVICAL SPINE: No abnormal sellar expansion. No suspicious calvarial bone marrow signal. Craniocervical junction maintained. SINUSES/ORBITS: Mild paranasal sinus mucosal thickening. Small bilateral mastoid effusions. The  included ocular globes and orbital contents are non-suspicious. OTHER: Life-support lines in place. 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 Electronically Signed   By: CElon AlasM.D.   On: 12/27/2016 03:21   Dg Lumbar Spine 1 View  Result Date: 12/27/2016 CLINICAL DATA:  Placement of lumbar drain. EXAM: LUMBAR SPINE - 1 VIEW; DG C-ARM 61-120 MIN COMPARISON:  None. FLUOROSCOPY TIME:  6 seconds FINDINGS: A single image demonstrates a device at what appears to be the L4-5 level just to the right of midline. IMPRESSION: Device at L4-5 just to the right of midline. Electronically Signed   By: JLorriane ShireM.D.   On: 12/27/2016 09:59   Dg Chest Port 1 View  Result Date: 12/28/2016 CLINICAL DATA:  Pneumonia, HIV, perirectal abscess.  Former smoker. EXAM: PORTABLE CHEST 1 VIEW COMPARISON:  Chest x-ray of Dec 27, 2016 FINDINGS: The lungs are well-expanded. Patchy interstitial densities have improved bilaterally. The retrocardiac region is less dense and right lower lobe infiltrate has improved. There is no significant pleural effusion. The cardiac silhouette is top-normal in size. The pulmonary vascularity is less prominent. The endotracheal tube tip measures 4 cm above the carina. The esophagogastric tube tip in proximal port project below the inferior margin of the image. IMPRESSION: Improved appearance of the lung parenchyma consistent with resolving pneumonia. The support tubes are in reasonable position. Electronically Signed   By: David  JMartiniqueM.D.   On: 12/28/2016 07:34   Dg Chest Port 1 View  Result Date: 12/27/2016 CLINICAL DATA:  Respiratory failure. EXAM: PORTABLE CHEST 1 VIEW COMPARISON:  12/26/2016 FINDINGS:  Endotracheal tube terminates 3 cm above the carina. Enteric tube courses into the left upper abdomen with tip not imaged. Cardiomediastinal silhouette is unchanged. Prominence of  the right paratracheal and right hilar soft tissues is again noted corresponding to lymphadenopathy demonstrated on recent CT. There is likely a persistent small right pleural effusion, although it is less conspicuous than on the prior radiograph. Interstitial and airspace opacities in the mid and lower lungs bilaterally have increased. No pneumothorax is identified. IMPRESSION: 1. Increased bilateral airspace disease concerning for pneumonia. 2. Small right pleural effusion. Electronically Signed   By: Logan Bores M.D.   On: 12/27/2016 07:27   Dg Chest Port 1 View  Result Date: 12/26/2016 CLINICAL DATA:  31 year old male status post intubation and enteric tube placement. EXAM: PORTABLE CHEST 1 VIEW COMPARISON:  Chest radiograph dated 12/23/2016 and chest CT dated 12/21/2016 FINDINGS: There has been interval placement of an endotracheal tube with tip approximately 4.5 cm above the carina. An enteric tube extends below the diaphragm with tip over the right L4 pedicle likely in the distal stomach. The side port of the enteric tube is likely located in the gastric body. There is a small right pleural effusion, increased from prior study. There is associated partial atelectasis of the right lung base. Right hilar prominence as seen on the prior radiograph. There is no pneumothorax. The cardiac silhouette is within normal limits. There is paucity of the small bowel air. Air is noted throughout the colon. There is no bowel dilatation or evidence of obstruction. A 12 mm linear radiopaque density to the right of L2-L3 disc may represent calcification or surgical clip. No free air identified. The osseous structures and soft tissues appear unremarkable IMPRESSION: 1. Endotracheal tube above the carina and enteric tube the tip likely in the distal stomach. 2. Right-sided pleural effusion, increased compared to prior study. There is associated compressive atelectasis versus infiltrate of the right lung base. 3. Right hilar  prominence, likely related to underlying lymphadenopathy. 4. No evidence of bowel obstruction. Electronically Signed   By: Anner Crete M.D.   On: 12/26/2016 20:01   Dg C-arm 1-60 Min  Result Date: 12/27/2016 CLINICAL DATA:  Placement of lumbar drain. EXAM: LUMBAR SPINE - 1 VIEW; DG C-ARM 61-120 MIN COMPARISON:  None. FLUOROSCOPY TIME:  6 seconds FINDINGS: A single image demonstrates a device at what appears to be the L4-5 level just to the right of midline. IMPRESSION: Device at L4-5 just to the right of midline. Electronically Signed   By: Lorriane Shire M.D.   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 Lumbar drain in place - greatly appreciate neurosurgery assistance. Afebrile over night. WBC stable 10.1.   CM =  - continue on L-ampho. Oral flucytosine given via tube.  - Creatinine up today 0.88 >> 0.99 >> 1.25. [name] +15L since admission. He was without the Pre-/Post- infusion saline bolus for what appears 1 - 2 doses of ampho yesterday. Back on board now. Continue to monitor trend. May need to increase from 500 - 1000cc to assist with  tolerability as he is only halfway through induction.  -Repeat BCx pending for clearance   Perirectal abscess = - Received 7 days treatment - stop ceftriaxone and reduce bactrim to SS daily for OI proph - Wound care per surgery   Lung lesions =  - likely crypto involvement, but need to r/o mTB with recent incarceration. Continue Airborne precautions.  - Acid fast smear negative from recent BAL; mTB probe pending  - PCP smear negative - Will need to assess one more negative smear. Appreciate RT's assistance with obtaining sample.   hiv  disease=  - defer ART for 5 weeks after CM treatment  Stroke = 2/2 CM - Prognosis is poor with recent imaging of stroke in CM. D/W Neurology team and uncertain as to these events are recoverable. - Non-purposeful movements, biting ETT today. Is back on Fentanyl for comfort.   [name], MSN, NP-C [facility] [place] Cell: [phone] Pager: [phone]  12/28/2016, 9:44 AM

## 2016-12-28 NOTE — Progress Notes (Signed)
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 Pharmacy consulted to manage electrolytes. SCr up 1.25 (fluid boluses pre and post-ambisome added back 5/30).  K up to 3.7>4.3 (s/p K+ 16meq replacement yesterday) Mag up 1.5>2.3 (s/p mag sulfate 4g replacement yesterday)   Plan: Start KCl 76mEq PT daily If K<4 on 5/31, increase to BID Monitor K and Mag closely and order additional replacement prn   Elicia Lamp, PharmD, BCPS Clinical Pharmacist 12/28/2016 10:04 AM

## 2016-12-28 NOTE — Progress Notes (Signed)
Subjective: Sedated and intubated.   Objective: Current vital signs: BP 120/80   Pulse (!) 59   Temp 98 F (36.7 C) (Axillary)   Resp 15   Ht 5' 6" (1.676 m)   Wt 67.6 kg (149 lb 0.5 oz)   SpO2 100%   BMI 24.05 kg/m  Vital signs in last 24 hours: Temp:  [98 F (36.7 C)-98.9 F (37.2 C)] 98 F (36.7 C) (05/30 0800) Pulse Rate:  [51-74] 59 (05/30 1000) Resp:  [12-19] 15 (05/30 1000) BP: (101-123)/(64-84) 120/80 (05/30 1000) SpO2:  [100 %] 100 % (05/30 1000) FiO2 (%):  [40 %-50 %] 40 % (05/30 0800) Weight:  [66.5 kg (146 lb 9.7 oz)-67.6 kg (149 lb 0.5 oz)] 67.6 kg (149 lb 0.5 oz) (05/30 0407)  Intake/Output from previous day: 05/29 0701 - 05/30 0700 In: 3813.9 [I.V.:2076.1; NG/GT:1037.8; IV Piggyback:700] Out: 1972 [Urine:1820; Drains:152] Intake/Output this shift: Total I/O In: 946.7 [I.V.:236.7; NG/GT:210; IV Piggyback:500] Out: 118 [Urine:100; Drains:18] Nutritional status: Diet NPO time specified  Examination  General: Skin: Noncyanotic, anicteric Ext: Warm and well-perfused  Neurologic Exam: Exam while intubated, under relatively light sedation with fentanyl.  Ment: Comatose. No eye opening spontaneously or to stimuli. No attempts to communicate. No movement spontaneously or to stimuli.  CN: Pupils 5 mm and sluggishly reactive to 4 mm bilaterally. No blink to threat. Corneal reflexes intact. Slow, low amplitude roving EOM. No nystagmus. Face flaccidly symmetric.  Motor/Sensory: Upper extremities with weak tonic posturing bilaterally, fingers flexed and arms internally rotated, with decreased tone and no movement to noxious. Lower extremities flaccid with no movement to noxious.  Reflexes: Hypoactive x 4.   Lab Results: Results for orders placed or performed during the hospital encounter of 12/20/16 (from the past 48 hour(s))  Hemoglobin and hematocrit, blood     Status: Abnormal   Collection Time: 12/26/16  6:57 PM  Result Value Ref Range   Hemoglobin 9.7 (L)  13.0 - 17.0 g/dL    Comment: POST TRANSFUSION SPECIMEN   HCT 30.0 (L) 39.0 - 52.0 %  Triglycerides     Status: None   Collection Time: 12/26/16  7:02 PM  Result Value Ref Range   Triglycerides 144 <150 mg/dL  Acid Fast Smear (AFB)     Status: None   Collection Time: 12/26/16  7:06 PM  Result Value Ref Range   AFB Specimen Processing Concentration    Acid Fast Smear Negative     Comment: (NOTE) Performed At: [facility] [address] [place], [place] [id number] Lindon Romp MD WK:[id number]    Source (AFB) BRONCHIAL ALVEOLAR LAVAGE   Culture, fungus without smear     Status: None (Preliminary result)   Collection Time: 12/26/16  7:06 PM  Result Value Ref Range   Specimen Description BRONCHIAL ALVEOLAR LAVAGE    Special Requests Immunocompromised    Culture NO FUNGUS ISOLATED AFTER 2 DAYS    Report Status PENDING   Culture, bal-quantitative     Status: None (Preliminary result)   Collection Time: 12/26/16  7:06 PM  Result Value Ref Range   Specimen Description BRONCHIAL ALVEOLAR LAVAGE    Special Requests Immunocompromised    Gram Stain      RARE WBC PRESENT, PREDOMINANTLY PMN NO ORGANISMS SEEN    Culture NO GROWTH 2 DAYS    Report Status PENDING   Pneumocystis smear by DFA     Status: None   Collection Time: 12/26/16  7:06 PM  Result Value Ref Range  Specimen Source-[facility] BRONCHIAL ALVEOLAR LAVAGE    Pneumocystis jiroveci Ag NEGATIVE     Comment: Performed at [facility]  MTB NAA Without AFB Culture     Status: None (Preliminary result)   Collection Time: 12/26/16  7:06 PM  Result Value Ref Range   M Tuberculosis, Naa PENDING    AFB Specimen Processing Concentration     Comment: (NOTE) Performed At: [facility] [place], [place] [id number] Lindon Romp MD BH:[id number]   I-STAT 3, arterial blood gas (G3+)     Status: Abnormal   Collection Time: 12/26/16  8:28 PM  Result Value Ref Range   pH, Arterial 7.460  (H) 7.350 - 7.450   pCO2 arterial 31.8 (L) 32.0 - 48.0 mmHg   pO2, Arterial 348.0 (H) 83.0 - 108.0 mmHg   Bicarbonate 22.6 20.0 - 28.0 mmol/L   TCO2 24 0 - 100 mmol/L   O2 Saturation 100.0 %   Acid-base deficit 1.0 0.0 - 2.0 mmol/L   Patient temperature 98.6 F    Collection site RADIAL, ALLEN'S TEST ACCEPTABLE    Drawn by RT    Sample type ARTERIAL   Glucose, capillary     Status: Abnormal   Collection Time: 12/26/16  8:48 PM  Result Value Ref Range   Glucose-Capillary 129 (H) 65 - 99 mg/dL  Glucose, capillary     Status: None   Collection Time: 12/27/16 12:21 AM  Result Value Ref Range   Glucose-Capillary 93 65 - 99 mg/dL  Glucose, capillary     Status: Abnormal   Collection Time: 12/27/16  3:37 AM  Result Value Ref Range   Glucose-Capillary 107 (H) 65 - 99 mg/dL  Magnesium     Status: Abnormal   Collection Time: 12/27/16  5:24 AM  Result Value Ref Range   Magnesium 1.5 (L) 1.7 - 2.4 mg/dL  Basic metabolic panel     Status: Abnormal   Collection Time: 12/27/16  5:24 AM  Result Value Ref Range   Sodium 137 135 - 145 mmol/L   Potassium 3.7 3.5 - 5.1 mmol/L   Chloride 110 101 - 111 mmol/L   CO2 19 (L) 22 - 32 mmol/L   Glucose, Bld 108 (H) 65 - 99 mg/dL   BUN <5 (L) 6 - 20 mg/dL   Creatinine, Ser 0.99 0.61 - 1.24 mg/dL   Calcium 8.4 (L) 8.9 - 10.3 mg/dL   GFR calc non Af Amer >60 >60 mL/min   GFR calc Af Amer >60 >60 mL/min    Comment: (NOTE) The eGFR has been calculated using the CKD EPI equation. This calculation has not been validated in all clinical situations. eGFR's persistently <60 mL/min signify possible Chronic Kidney Disease.    Anion gap 8 5 - 15  Hepatic function panel     Status: Abnormal   Collection Time: 12/27/16  5:24 AM  Result Value Ref Range   Total Protein 8.5 (H) 6.5 - 8.1 g/dL   Albumin 2.3 (L) 3.5 - 5.0 g/dL   AST 36 15 - 41 U/L   ALT 57 17 - 63 U/L   Alkaline Phosphatase 144 (H) 38 - 126 U/L   Total Bilirubin 0.7 0.3 - 1.2 mg/dL    Bilirubin, Direct 0.2 0.1 - 0.5 mg/dL   Indirect Bilirubin 0.5 0.3 - 0.9 mg/dL  CBC     Status: Abnormal   Collection Time: 12/27/16  5:24 AM  Result Value Ref Range   WBC 9.7  4.0 - 10.5 K/uL   RBC 3.59 (L) 4.22 - 5.81 MIL/uL   Hemoglobin 9.7 (L) 13.0 - 17.0 g/dL   HCT 29.3 (L) 39.0 - 52.0 %   MCV 81.6 78.0 - 100.0 fL   MCH 27.0 26.0 - 34.0 pg   MCHC 33.1 30.0 - 36.0 g/dL   RDW 15.0 11.5 - 15.5 %   Platelets 146 (L) 150 - 400 K/uL  Glucose, capillary     Status: Abnormal   Collection Time: 12/27/16  7:37 AM  Result Value Ref Range   Glucose-Capillary 101 (H) 65 - 99 mg/dL   Comment 1 Notify RN   Glucose, capillary     Status: Abnormal   Collection Time: 12/27/16 11:34 AM  Result Value Ref Range   Glucose-Capillary 142 (H) 65 - 99 mg/dL   Comment 1 Notify RN   Magnesium     Status: Abnormal   Collection Time: 12/27/16  6:10 PM  Result Value Ref Range   Magnesium 3.1 (H) 1.7 - 2.4 mg/dL  Phosphorus     Status: Abnormal   Collection Time: 12/27/16  6:10 PM  Result Value Ref Range   Phosphorus 5.1 (H) 2.5 - 4.6 mg/dL  Glucose, capillary     Status: Abnormal   Collection Time: 12/27/16  8:38 PM  Result Value Ref Range   Glucose-Capillary 135 (H) 65 - 99 mg/dL  Glucose, capillary     Status: Abnormal   Collection Time: 12/27/16 11:47 PM  Result Value Ref Range   Glucose-Capillary 138 (H) 65 - 99 mg/dL  Glucose, capillary     Status: Abnormal   Collection Time: 12/28/16  3:52 AM  Result Value Ref Range   Glucose-Capillary 125 (H) 65 - 99 mg/dL  Magnesium     Status: None   Collection Time: 12/28/16  5:12 AM  Result Value Ref Range   Magnesium 2.3 1.7 - 2.4 mg/dL  Comprehensive metabolic panel     Status: Abnormal   Collection Time: 12/28/16  5:12 AM  Result Value Ref Range   Sodium 134 (L) 135 - 145 mmol/L   Potassium 4.3 3.5 - 5.1 mmol/L   Chloride 110 101 - 111 mmol/L   CO2 19 (L) 22 - 32 mmol/L   Glucose, Bld 146 (H) 65 - 99 mg/dL   BUN 11 6 - 20 mg/dL    Creatinine, Ser 1.25 (H) 0.61 - 1.24 mg/dL   Calcium 8.0 (L) 8.9 - 10.3 mg/dL   Total Protein 8.3 (H) 6.5 - 8.1 g/dL   Albumin 2.2 (L) 3.5 - 5.0 g/dL   AST 30 15 - 41 U/L   ALT 42 17 - 63 U/L   Alkaline Phosphatase 135 (H) 38 - 126 U/L   Total Bilirubin 0.4 0.3 - 1.2 mg/dL   GFR calc non Af Amer >60 >60 mL/min   GFR calc Af Amer >60 >60 mL/min    Comment: (NOTE) The eGFR has been calculated using the CKD EPI equation. This calculation has not been validated in all clinical situations. eGFR's persistently <60 mL/min signify possible Chronic Kidney Disease.    Anion gap 5 5 - 15  CBC     Status: Abnormal   Collection Time: 12/28/16  5:12 AM  Result Value Ref Range   WBC 10.1 4.0 - 10.5 K/uL   RBC 3.27 (L) 4.22 - 5.81 MIL/uL   Hemoglobin 8.8 (L) 13.0 - 17.0 g/dL   HCT 27.7 (L) 39.0 - 52.0 %   MCV 84.7 78.0 -  100.0 fL   MCH 26.9 26.0 - 34.0 pg   MCHC 31.8 30.0 - 36.0 g/dL   RDW 16.0 (H) 11.5 - 15.5 %   Platelets 155 150 - 400 K/uL  Phosphorus     Status: None   Collection Time: 12/28/16  5:12 AM  Result Value Ref Range   Phosphorus 4.4 2.5 - 4.6 mg/dL  Glucose, capillary     Status: Abnormal   Collection Time: 12/28/16  8:30 AM  Result Value Ref Range   Glucose-Capillary 142 (H) 65 - 99 mg/dL    Recent Results (from the past 240 hour(s))  Culture, blood (Routine X 2) w Reflex to ID Panel     Status: Abnormal   Collection Time: 12/20/16  6:42 AM  Result Value Ref Range Status   Specimen Description BLOOD LEFT ANTECUBITAL  Final   Special Requests   Final    BOTTLES DRAWN AEROBIC AND ANAEROBIC Blood Culture adequate volume   Culture  Setup Time   Final    AEROBIC BOTTLE ONLY YEAST CRITICAL VALUE NOTED.  VALUE IS CONSISTENT WITH PREVIOUSLY REPORTED AND CALLED VALUE. Performed at [facility], [place] [address] [place], [place] [address]    Culture CRYPTOCOCCUS NEOFORMANS (A)  Final   Report Status 12/26/2016 FINAL  Final  Culture, blood (Routine X 2) w Reflex to ID  Panel     Status: Abnormal   Collection Time: 12/20/16  6:54 AM  Result Value Ref Range Status   Specimen Description BLOOD RIGHT ANTECUBITAL  Final   Special Requests IN PEDIATRIC BOTTLE Blood Culture adequate volume  Final   Culture  Setup Time   Final    IN PEDIATRIC BOTTLE YEAST CRITICAL RESULT CALLED TO, READ BACK BY AND VERIFIED WITHJerel Shepherd PHARMD, AT 5361 12/23/16 BY Rush Landmark Performed at [facility], [place] [address] [place], [place] [address]    Culture CRYPTOCOCCUS NEOFORMANS (A)  Final   Report Status 12/25/2016 FINAL  Final  Blood Culture ID Panel (Reflexed)     Status: None   Collection Time: 12/20/16  6:54 AM  Result Value Ref Range Status   Enterococcus species NOT DETECTED NOT DETECTED Final   Listeria monocytogenes NOT DETECTED NOT DETECTED Final   Staphylococcus species NOT DETECTED NOT DETECTED Final   Staphylococcus aureus NOT DETECTED NOT DETECTED Final   Streptococcus species NOT DETECTED NOT DETECTED Final   Streptococcus agalactiae NOT DETECTED NOT DETECTED Final   Streptococcus pneumoniae NOT DETECTED NOT DETECTED Final   Streptococcus pyogenes NOT DETECTED NOT DETECTED Final   Acinetobacter baumannii NOT DETECTED NOT DETECTED Final   Enterobacteriaceae species NOT DETECTED NOT DETECTED Final   Enterobacter cloacae complex NOT DETECTED NOT DETECTED Final   Escherichia coli NOT DETECTED NOT DETECTED Final   Klebsiella oxytoca NOT DETECTED NOT DETECTED Final   Klebsiella pneumoniae NOT DETECTED NOT DETECTED Final   Proteus species NOT DETECTED NOT DETECTED Final   Serratia marcescens NOT DETECTED NOT DETECTED Final   Haemophilus influenzae NOT DETECTED NOT DETECTED Final   Neisseria meningitidis NOT DETECTED NOT DETECTED Final   Pseudomonas aeruginosa NOT DETECTED NOT DETECTED Final   Candida albicans NOT DETECTED NOT DETECTED Final   Candida glabrata NOT DETECTED NOT DETECTED Final   Candida krusei NOT DETECTED NOT DETECTED Final   Candida  parapsilosis NOT DETECTED NOT DETECTED Final   Candida tropicalis NOT DETECTED NOT DETECTED Final    Comment: Performed at [facility], [place] [address],  [place], [place] [address]  Surgical pcr screen     Status: None   Collection Time: 12/21/16  2:25 AM  Result Value Ref Range Status   MRSA, PCR NEGATIVE NEGATIVE Final   Staphylococcus aureus NEGATIVE NEGATIVE Final    Comment:        The Xpert SA Assay (FDA approved for NASAL specimens in patients over 53 years of age), is one component of a comprehensive surveillance program.  Test performance has been validated by [facility] for patients greater than or equal to 19 year old. It is not intended to diagnose infection nor to guide or monitor treatment.   Blood culture (routine x 2)     Status: Abnormal   Collection Time: 12/21/16  4:11 AM  Result Value Ref Range Status   Specimen Description BLOOD LEFT ANTECUBITAL  Final   Special Requests   Final    BOTTLES DRAWN AEROBIC ONLY Blood Culture adequate volume   Culture  Setup Time   Final    YEAST AEROBIC BOTTLE ONLY CRITICAL VALUE NOTED.  VALUE IS CONSISTENT WITH PREVIOUSLY REPORTED AND CALLED VALUE.    Culture CRYPTOCOCCUS NEOFORMANS (A)  Final   Report Status 12/26/2016 FINAL  Final  Blood culture (routine x 2)     Status: Abnormal   Collection Time: 12/21/16  4:11 AM  Result Value Ref Range Status   Specimen Description BLOOD RIGHT HAND  Final   Special Requests IN PEDIATRIC BOTTLE Blood Culture adequate volume  Final   Culture  Setup Time   Final    IN PEDIATRIC BOTTLE YEAST CRITICAL RESULT CALLED TO, READ BACK BY AND VERIFIED WITH: G.HIBBETT PHARMD, 1093 12/24/16 M.CAMPBELL    Culture CRYPTOCOCCUS NEOFORMANS (A)  Final   Report Status 12/26/2016 FINAL  Final  Blood Culture ID Panel (Reflexed)     Status: None   Collection Time: 12/21/16  4:11 AM  Result Value Ref Range Status   Enterococcus species NOT DETECTED NOT DETECTED Final   Listeria monocytogenes  NOT DETECTED NOT DETECTED Final   Staphylococcus species NOT DETECTED NOT DETECTED Final   Staphylococcus aureus NOT DETECTED NOT DETECTED Final   Streptococcus species NOT DETECTED NOT DETECTED Final   Streptococcus agalactiae NOT DETECTED NOT DETECTED Final   Streptococcus pneumoniae NOT DETECTED NOT DETECTED Final   Streptococcus pyogenes NOT DETECTED NOT DETECTED Final   Acinetobacter baumannii NOT DETECTED NOT DETECTED Final   Enterobacteriaceae species NOT DETECTED NOT DETECTED Final   Enterobacter cloacae complex NOT DETECTED NOT DETECTED Final   Escherichia coli NOT DETECTED NOT DETECTED Final   Klebsiella oxytoca NOT DETECTED NOT DETECTED Final   Klebsiella pneumoniae NOT DETECTED NOT DETECTED Final   Proteus species NOT DETECTED NOT DETECTED Final   Serratia marcescens NOT DETECTED NOT DETECTED Final   Haemophilus influenzae NOT DETECTED NOT DETECTED Final   Neisseria meningitidis NOT DETECTED NOT DETECTED Final   Pseudomonas aeruginosa NOT DETECTED NOT DETECTED Final   Candida albicans NOT DETECTED NOT DETECTED Final   Candida glabrata NOT DETECTED NOT DETECTED Final   Candida krusei NOT DETECTED NOT DETECTED Final   Candida parapsilosis NOT DETECTED NOT DETECTED Final   Candida tropicalis NOT DETECTED NOT DETECTED Final  Aerobic/Anaerobic Culture (surgical/deep wound)     Status: Abnormal   Collection Time: 12/21/16  1:39 PM  Result Value Ref Range Status   Specimen Description ABSCESS  Final   Special Requests   Final    PERIANAL PATIENT ON FOLLOWING  SEPTRA DS AND ZITHROMAX  Gram Stain   Final    FEW WBC PRESENT,BOTH PMN AND MONONUCLEAR FEW GRAM VARIABLE ROD    Culture (A)  Final    MULTIPLE ORGANISMS PRESENT, NONE PREDOMINANT MIXED ANAEROBIC FLORA PRESENT.  CALL LAB IF FURTHER IID REQUIRED.    Report Status 12/27/2016 FINAL  Final  CSF culture with Stat gram stain     Status: None   Collection Time: 12/22/16  9:29 AM  Result Value Ref Range Status   Specimen  Description BACK  Final   Special Requests Immunocompromised  Final   Gram Stain   Final    CYTOSPIN SMEAR WBC PRESENT, PREDOMINANTLY MONONUCLEAR YEAST CRITICAL RESULT CALLED TO, READ BACK BY AND VERIFIED WITH: RN Morton Peters [phone] MLM    Culture ABUNDANT CRYPTOCOCCUS NEOFORMANS  Final   Report Status 12/25/2016 FINAL  Final  Culture, fungus without smear     Status: Abnormal (Preliminary result)   Collection Time: 12/22/16  9:51 AM  Result Value Ref Range Status   Specimen Description CSF  Final   Special Requests Immunocompromised  Final   Culture CRYPTOCOCCUS NEOFORMANS (A)  Final   Report Status PENDING  Incomplete  CSF culture with Stat gram stain     Status: None (Preliminary result)   Collection Time: 12/25/16  5:04 PM  Result Value Ref Range Status   Specimen Description CSF  Final   Special Requests Immunocompromised  Final   Gram Stain   Final    WBC PRESENT,BOTH PMN AND MONONUCLEAR BUDDING YEAST SEEN CYTOSPIN SMEAR CRITICAL RESULT CALLED TO, READ BACK BY AND VERIFIED WITH: L TURNER,RN AT 1821 12/25/16 BY L BENFIELD    Culture CULTURE REINCUBATED FOR BETTER GROWTH  Final   Report Status PENDING  Incomplete  Culture, fungus without smear     Status: Abnormal (Preliminary result)   Collection Time: 12/25/16  5:05 PM  Result Value Ref Range Status   Specimen Description CSF  Final   Special Requests Immunocompromised  Final   Culture YEAST (A)  Final   Report Status PENDING  Incomplete  Acid Fast Smear (AFB)     Status: None   Collection Time: 12/26/16  7:06 PM  Result Value Ref Range Status   AFB Specimen Processing Concentration  Final   Acid Fast Smear Negative  Final    Comment: (NOTE) Performed At: [facility] [address] [place], [place] [id number] Lindon Romp MD OI:[id number]    Source (AFB) BRONCHIAL ALVEOLAR LAVAGE  Final  Culture, fungus without smear     Status: None (Preliminary result)   Collection Time: 12/26/16  7:06 PM   Result Value Ref Range Status   Specimen Description BRONCHIAL ALVEOLAR LAVAGE  Final   Special Requests Immunocompromised  Final   Culture NO FUNGUS ISOLATED AFTER 2 DAYS  Final   Report Status PENDING  Incomplete  Culture, bal-quantitative     Status: None (Preliminary result)   Collection Time: 12/26/16  7:06 PM  Result Value Ref Range Status   Specimen Description BRONCHIAL ALVEOLAR LAVAGE  Final   Special Requests Immunocompromised  Final   Gram Stain   Final    RARE WBC PRESENT, PREDOMINANTLY PMN NO ORGANISMS SEEN    Culture NO GROWTH 2 DAYS  Final   Report Status PENDING  Incomplete  Pneumocystis smear by DFA     Status: None   Collection Time: 12/26/16  7:06 PM  Result Value Ref Range Status   Specimen Source-[facility] BRONCHIAL ALVEOLAR LAVAGE  Final  Pneumocystis jiroveci Ag NEGATIVE  Final    Comment: Performed at [facility]    Lipid Panel  Recent Labs  12/26/16 1902  TRIG 144    Studies/Results: Dg Abd 1 View  Result Date: 12/26/2016 CLINICAL DATA:  31 year old male status post intubation and enteric tube placement. EXAM: PORTABLE CHEST 1 VIEW COMPARISON:  Chest radiograph dated 12/23/2016 and chest CT dated 12/21/2016 FINDINGS: There has been interval placement of an endotracheal tube with tip approximately 4.5 cm above the carina. An enteric tube extends below the diaphragm with tip over the right L4 pedicle likely in the distal stomach. The side port of the enteric tube is likely located in the gastric body. There is a small right pleural effusion, increased from prior study. There is associated partial atelectasis of the right lung base. Right hilar prominence as seen on the prior radiograph. There is no pneumothorax. The cardiac silhouette is within normal limits. There is paucity of the small bowel air. Air is noted throughout the colon. There is no bowel dilatation or evidence of obstruction. A 12 mm linear radiopaque density to the right of L2-L3  disc may represent calcification or surgical clip. No free air identified. The osseous structures and soft tissues appear unremarkable IMPRESSION: 1. Endotracheal tube above the carina and enteric tube the tip likely in the distal stomach. 2. Right-sided pleural effusion, increased compared to prior study. There is associated compressive atelectasis versus infiltrate of the right lung base. 3. Right hilar prominence, likely related to underlying lymphadenopathy. 4. No evidence of bowel obstruction. Electronically Signed   By: Anner Crete M.D.   On: 12/26/2016 20:01   Ct Head Wo Contrast  Result Date: 12/27/2016 CLINICAL DATA:  Cryptococcal meningitis. Unresponsive. Lumbar drain placement earlier today. EXAM: CT HEAD WITHOUT CONTRAST TECHNIQUE: Contiguous axial images were obtained from the base of the skull through the vertex without intravenous contrast. COMPARISON:  Head CT 12/26/2016 and MRI 12/27/2016 FINDINGS: Brain: There is new abnormal hypoattenuation involving the left caudate head as well as new or worsening hypoattenuation involving the anterior left lentiform nucleus. There is at most subtle hypoattenuation inferiorly in the right basal ganglia. No acute cortical infarct, intracranial hemorrhage, midline shift, or extra-axial fluid collection is seen. The ventricles and sulci are normal in size. Vascular: No hyperdense vessel. Skull: No fracture or focal osseous lesion. Sinuses/Orbits: Minimal sphenoid sinus mucosal thickening. Clear mastoid air cells. Unremarkable orbits. Other: None. IMPRESSION: Abnormal low-density in the left basal ganglia which may reflect worsening infection or acute infarction. Electronically Signed   By: Logan Bores M.D.   On: 12/27/2016 12:13   Ct Head Wo Contrast  Result Date: 12/26/2016 CLINICAL DATA:  Meningitis. EXAM: CT HEAD WITHOUT CONTRAST TECHNIQUE: Contiguous axial images were obtained from the base of the skull through the vertex without intravenous  contrast. COMPARISON:  CT scan of Dec 23, 2016. FINDINGS: Brain: No evidence of acute infarction, hemorrhage, hydrocephalus, extra-axial collection or mass lesion/mass effect. Vascular: No hyperdense vessel or unexpected calcification. Skull: Normal. Negative for fracture or focal lesion. Sinuses/Orbits: No acute finding. Other: None. IMPRESSION: Normal head CT. Electronically Signed   By: Marijo Conception, M.D.   On: 12/26/2016 18:37   Mr Brain Wo Contrast  Result Date: 12/27/2016 CLINICAL DATA:  Meningitis. Placement of lumbar drain earlier today with subsequent unresponsiveness. Assess for anesthesia complication or other unexpected finding. EXAM: MRI HEAD WITHOUT CONTRAST TECHNIQUE: Multiplanar, multiecho pulse sequences of the brain and surrounding structures were  obtained without intravenous contrast. COMPARISON:  CT same day.  MRI same day. FINDINGS: Brain: Diffusion imaging shows persistent areas of acute infarction at the inferior basal ganglia bilaterally. There is new acute infarction affecting the caudate and putamen on the left, not present earlier today. Areas of acute infarction of the frontoparietal cortical and subcortical brain are probably progressive as well. No hydrocephalus. No extra-axial collection. Ventricular size is stable. Vascular: Flow present in the major vessels at the base of the brain. Skull and upper cervical spine: Negative Sinuses/Orbits: Sinuses are clear. Bilateral mastoid effusions again demonstrated. Other: Hypercellular marrow pattern as seen previously. 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 Worsening of small vessel occlusive infarctions along the superior surface of the brain an underlying white matter as well. Findings are consistent with progressive complication of meningitis. Electronically Signed   By: Nelson Chimes  M.D.   On: 12/27/2016 13:53   Mr Jeri Cos BT Contrast  Result Date: 12/27/2016 CLINICAL DATA:  Worsening headache and decreased responsiveness 4 2 days. Of time did. History of cryptococcal meningitis. EXAM: MRI HEAD WITHOUT AND WITH CONTRAST TECHNIQUE: Multiplanar, multiecho pulse sequences of the brain and surrounding structures were obtained without and with intravenous contrast. CONTRAST:  92m MULTIHANCE GADOBENATE DIMEGLUMINE 529 MG/ML IV SOLN COMPARISON:  CT HEAD Dec 26, 2016 and Dec 23, 2016 FINDINGS: INTRACRANIAL CONTENTS: Faint reduced diffusion and decreased ADC values within bilateral inferior basal ganglia associated with T2 bright signal and mild leptomeningeal enhancement. Favor FLAIR T2 hyperintense signal within the dentate nuclei. No susceptibility artifact to suggest hemorrhage. The ventricles and sulci are normal for patient's age. No mass mass effect. No abnormal extra-axial fluid collections. No extra-axial masses. VASCULAR: Normal major intracranial vascular flow voids present at skull base. SKULL AND UPPER CERVICAL SPINE: No abnormal sellar expansion. No suspicious calvarial bone marrow signal. Craniocervical junction maintained. SINUSES/ORBITS: Mild paranasal sinus mucosal thickening. Small bilateral mastoid effusions. The included ocular globes and orbital contents are non-suspicious. OTHER: Life-support lines in place. 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 Electronically Signed   By: CElon AlasM.D.   On: 12/27/2016 03:21   Dg Lumbar Spine 1 View  Result Date: 12/27/2016 CLINICAL DATA:  Placement of lumbar drain. EXAM: LUMBAR SPINE - 1 VIEW; DG C-ARM 61-120 MIN COMPARISON:  None. FLUOROSCOPY TIME:  6 seconds FINDINGS: A single image demonstrates a device at what appears to be the L4-5 level just to the right of midline. IMPRESSION: Device at L4-5 just to the right of  midline. Electronically Signed   By: JLorriane ShireM.D.   On: 12/27/2016 09:59   Dg Chest Port 1 View  Result Date: 12/28/2016 CLINICAL DATA:  Pneumonia, HIV, perirectal abscess.  Former smoker. EXAM: PORTABLE CHEST 1 VIEW COMPARISON:  Chest x-ray of Dec 27, 2016 FINDINGS: The lungs are well-expanded. Patchy interstitial densities have improved bilaterally. The retrocardiac region is less dense and right lower lobe infiltrate has improved. There is no significant pleural effusion. The cardiac silhouette is top-normal in size. The pulmonary vascularity is less prominent. The endotracheal tube tip measures 4 cm above the carina. The esophagogastric tube tip in proximal port project below the inferior margin of the image. IMPRESSION: Improved appearance of the lung parenchyma consistent with resolving pneumonia. The support tubes are in reasonable position. Electronically Signed  By: David  Martinique M.D.   On: 12/28/2016 07:34   Dg Chest Port 1 View  Result Date: 12/27/2016 CLINICAL DATA:  Respiratory failure. EXAM: PORTABLE CHEST 1 VIEW COMPARISON:  12/26/2016 FINDINGS: Endotracheal tube terminates 3 cm above the carina. Enteric tube courses into the left upper abdomen with tip not imaged. Cardiomediastinal silhouette is unchanged. Prominence of the right paratracheal and right hilar soft tissues is again noted corresponding to lymphadenopathy demonstrated on recent CT. There is likely a persistent small right pleural effusion, although it is less conspicuous than on the prior radiograph. Interstitial and airspace opacities in the mid and lower lungs bilaterally have increased. No pneumothorax is identified. IMPRESSION: 1. Increased bilateral airspace disease concerning for pneumonia. 2. Small right pleural effusion. Electronically Signed   By: Logan Bores M.D.   On: 12/27/2016 07:27   Dg Chest Port 1 View  Result Date: 12/26/2016 CLINICAL DATA:  31 year old male status post intubation and enteric tube  placement. EXAM: PORTABLE CHEST 1 VIEW COMPARISON:  Chest radiograph dated 12/23/2016 and chest CT dated 12/21/2016 FINDINGS: There has been interval placement of an endotracheal tube with tip approximately 4.5 cm above the carina. An enteric tube extends below the diaphragm with tip over the right L4 pedicle likely in the distal stomach. The side port of the enteric tube is likely located in the gastric body. There is a small right pleural effusion, increased from prior study. There is associated partial atelectasis of the right lung base. Right hilar prominence as seen on the prior radiograph. There is no pneumothorax. The cardiac silhouette is within normal limits. There is paucity of the small bowel air. Air is noted throughout the colon. There is no bowel dilatation or evidence of obstruction. A 12 mm linear radiopaque density to the right of L2-L3 disc may represent calcification or surgical clip. No free air identified. The osseous structures and soft tissues appear unremarkable IMPRESSION: 1. Endotracheal tube above the carina and enteric tube the tip likely in the distal stomach. 2. Right-sided pleural effusion, increased compared to prior study. There is associated compressive atelectasis versus infiltrate of the right lung base. 3. Right hilar prominence, likely related to underlying lymphadenopathy. 4. No evidence of bowel obstruction. Electronically Signed   By: Anner Crete M.D.   On: 12/26/2016 20:01   Dg C-arm 1-60 Min  Result Date: 12/27/2016 CLINICAL DATA:  Placement of lumbar drain. EXAM: LUMBAR SPINE - 1 VIEW; DG C-ARM 61-120 MIN COMPARISON:  None. FLUOROSCOPY TIME:  6 seconds FINDINGS: A single image demonstrates a device at what appears to be the L4-5 level just to the right of midline. IMPRESSION: Device at L4-5 just to the right of midline. Electronically Signed   By: Lorriane Shire M.D.   On: 12/27/2016 09:59    Medications:   Current Facility-Administered Medications:  .  0.9  %  sodium chloride infusion, , Intravenous, Continuous, Elgergawy, Silver Huguenin, MD, Last Rate: 75 mL/hr at 12/28/16 1000 .  acetaminophen (TYLENOL) solution 650 mg, 650 mg, Per Tube, Q6H PRN, Halford Chessman, Vineet, MD .  amphotericin B liposome (AMBISOME) 240 mg in dextrose 5 % 500 mL IVPB, 3.5 mg/kg, Intravenous, Q24H, Byrum, Rose Fillers, MD .  Derrill Memo ON 12/30/2016] azithromycin (ZITHROMAX) 200 MG/5ML suspension 1,200 mg, 1,200 mg, Per Tube, Q Jiles Garter, Elisabeth Cara, MD .  cefTRIAXone (ROCEPHIN) 2 g in dextrose 5 % 50 mL IVPB, 2 g, Intravenous, Q24H, Carlyle Basques, MD, Stopped at 12/27/16 2114 .  chlorhexidine gluconate (MEDLINE KIT) (PERIDEX) 0.12 %  solution 15 mL, 15 mL, Mouth Rinse, BID, Sood, Vineet, MD, 15 mL at 12/28/16 0820 .  [DISCONTINUED] diphenhydrAMINE (BENADRYL) capsule 25 mg, 25 mg, Oral, Q6H PRN, 25 mg at 12/24/16 0315 **OR** diphenhydrAMINE (BENADRYL) injection 25 mg, 25 mg, Intravenous, Q6H PRN, Rayburn, Kelly A, PA-C, 25 mg at 12/22/16 2229 .  docusate (COLACE) 50 MG/5ML liquid 100 mg, 100 mg, Per Tube, BID PRN, Chesley Mires, MD .  famotidine (PEPCID) IVPB 20 mg premix, 20 mg, Intravenous, Q12H, Chesley Mires, MD, Stopped at 12/27/16 2144 .  feeding supplement (VITAL AF 1.2 CAL) liquid 1,000 mL, 1,000 mL, Per Tube, Continuous, Sood, Vineet, MD, Last Rate: 70 mL/hr at 12/28/16 1000, 1,000 mL at 12/28/16 1000 .  fentaNYL (SUBLIMAZE) bolus via infusion 50 mcg, 50 mcg, Intravenous, Q1H PRN, Chesley Mires, MD, 75 mcg at 12/27/16 0933 .  fentaNYL 2549mg in NS 2564m(1017mml) infusion-PREMIX, 25-400 mcg/hr, Intravenous, Continuous, Sood, Vineet, MD, Last Rate: 5 mL/hr at 12/28/16 1000, 50 mcg/hr at 12/28/16 1000 .  flucytosine (ANCOBON) capsule 1,500 mg, 1,500 mg, Per Tube, Q6H, SooChesley MiresD, 1,500 mg at 12/28/16 0622 .  lidocaine (PF) (XYLOCAINE) 1 % injection 5 mL, 5 mL, Other, Once, Dixon, SteNorthrop GrummanP .  MEDLINE mouth rinse, 15 mL, Mouth Rinse, 10 times per day, SooChesley MiresD, 15 mL at 12/28/16  1016 .  midazolam (VERSED) injection 2 mg, 2 mg, Intravenous, Q2H PRN, SooChesley MiresD, 2 mg at 12/28/16 0056 .  [DISCONTINUED] ondansetron (ZOFRAN-ODT) disintegrating tablet 4 mg, 4 mg, Oral, Q6H PRN **OR** ondansetron (ZOFRAN) injection 4 mg, 4 mg, Intravenous, Q6H PRN, Rayburn, Kelly A, PA-C, 4 mg at 12/22/16 1939 .  polyethylene glycol (MIRALAX / GLYCOLAX) packet 17 g, 17 g, Oral, Daily PRN, Rayburn, Kelly A, PA-C .  potassium chloride 20 MEQ/15ML (10%) solution 40 mEq, 40 mEq, Per Tube, Daily, BaiRomona CurlsPH .  propofol (DIPRIVAN) 1000 MG/100ML infusion, 0-50 mcg/kg/min, Intravenous, Continuous, SooChesley MiresD, Stopped at 12/27/16 1030 .  sodium chloride 0.9 % bolus 500 mL, 500 mL, Intravenous, Q24H, Byrum, RobRose FillersD, Last Rate: 500 mL/hr at 12/28/16 1000, 500 mL at 12/28/16 1000 .  sodium chloride 0.9 % bolus 500 mL, 500 mL, Intravenous, Q24H, Byrum, RobRose FillersD .  sulfamethoxazole-trimethoprim (BACTRIM,SEPTRA) 200-40 MG/5ML suspension 20 mL, 20 mL, Per Tube, Q12H, Elgergawy, DawSilver HugueninD, 20 mL at 12/27/16 2114 .  valACYclovir (VALTREX) tablet 1,000 mg, 1,000 mg, Per Tube, BID, SooChesley MiresD, 1,000 mg at 12/28/16 [id number]: [age]ar old HIV positive male who developed progressively worsening mental status along with focal neurological symptoms in the setting of elevated ICP. Symptoms did seem to improve after lumbar puncture. CSF positive for cryptococcal meningitis.  1. CSF culture shows budding yeast and is cryptococcal Ag positive. Blood cultures also positive for cryptococcus. ID is following. On IV amphotericin B and flucytosine per NGT.   2. MRI performed at 2:58 AM yesterday showed shows faint reduced diffusion and decreased ADC values within the bilateral inferior basal ganglia associated with T2 bright signal and mild leptomeningeal enhancement. The findings are consistent with cryptococcal meningitis with infiltration of the perivascular CSF spaces within the  basal ganglia.  3. Repeat MRI brain without contrast at [id number] showed worsening/progression of small vessel occlusive infarctions along the 4. base the brain bilaterally with new involvement affecting a large portion of the corpus striatum on the left. Worsening of small vessel occlusive infarctions along the superior surface of the brain  and underlying white matter as well. 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  Recommendations: 1. Continue IV amphotericin B and flucytosine per NGT. ID following.  2. Also on ABX for perirectal abscess 3. On Valacyclovir for penile herpes 4. Status post lumbar drain placement by Dr. Kathyrn Sheriff. Clear CSF drainage noted today.  5. Neurology will continue to follow   LOS: 7 days   _0  signed: Dr. Kerney Elbe 12/28/2016  10:43 AM

## 2016-12-29 ENCOUNTER — Inpatient Hospital Stay (HOSPITAL_COMMUNITY): Payer: Medicaid Other

## 2016-12-29 LAB — CBC
HEMATOCRIT: 26.5 % — AB (ref 39.0–52.0)
Hemoglobin: 8.2 g/dL — ABNORMAL LOW (ref 13.0–17.0)
MCH: 26.2 pg (ref 26.0–34.0)
MCHC: 30.9 g/dL (ref 30.0–36.0)
MCV: 84.7 fL (ref 78.0–100.0)
Platelets: 165 10*3/uL (ref 150–400)
RBC: 3.13 MIL/uL — AB (ref 4.22–5.81)
RDW: 16.2 % — ABNORMAL HIGH (ref 11.5–15.5)
WBC: 6.7 10*3/uL (ref 4.0–10.5)

## 2016-12-29 LAB — GLUCOSE, CAPILLARY
GLUCOSE-CAPILLARY: 112 mg/dL — AB (ref 65–99)
GLUCOSE-CAPILLARY: 98 mg/dL (ref 65–99)
Glucose-Capillary: 100 mg/dL — ABNORMAL HIGH (ref 65–99)
Glucose-Capillary: 102 mg/dL — ABNORMAL HIGH (ref 65–99)
Glucose-Capillary: 105 mg/dL — ABNORMAL HIGH (ref 65–99)
Glucose-Capillary: 120 mg/dL — ABNORMAL HIGH (ref 65–99)
Glucose-Capillary: 93 mg/dL (ref 65–99)

## 2016-12-29 LAB — CULTURE, BAL-QUANTITATIVE W GRAM STAIN: Culture: NO GROWTH

## 2016-12-29 LAB — BASIC METABOLIC PANEL
Anion gap: 6 (ref 5–15)
BUN: 18 mg/dL (ref 6–20)
CO2: 18 mmol/L — ABNORMAL LOW (ref 22–32)
Calcium: 8.1 mg/dL — ABNORMAL LOW (ref 8.9–10.3)
Chloride: 113 mmol/L — ABNORMAL HIGH (ref 101–111)
Creatinine, Ser: 1.14 mg/dL (ref 0.61–1.24)
GFR calc non Af Amer: >60 mL/min (ref >60)
Glucose, Bld: 109 mg/dL — ABNORMAL HIGH (ref 65–99)
POTASSIUM: 4.1 mmol/L (ref 3.5–5.1)
SODIUM: 137 mmol/L (ref 135–145)

## 2016-12-29 LAB — CSF CULTURE

## 2016-12-29 LAB — CSF CULTURE W GRAM STAIN

## 2016-12-29 LAB — C DIFFICILE QUICK SCREEN W PCR REFLEX
C DIFFICILE (CDIFF) INTERP: NOT DETECTED
C DIFFICLE (CDIFF) ANTIGEN: NEGATIVE
C Diff toxin: NEGATIVE

## 2016-12-29 LAB — MAGNESIUM: Magnesium: 1.8 mg/dL (ref 1.7–2.4)

## 2016-12-29 LAB — MTB NAA WITHOUT AFB CULTURE: SOURCE: NEGATIVE

## 2016-12-29 LAB — CULTURE, BAL-QUANTITATIVE

## 2016-12-29 MED ORDER — MAGNESIUM SULFATE 2 GM/50ML IV SOLN
2.0000 g | Freq: Once | INTRAVENOUS | Status: AC
  Administered 2016-12-29: 2 g via INTRAVENOUS
  Filled 2016-12-29: qty 50

## 2016-12-29 NOTE — Progress Notes (Signed)
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 Pharmacy consulted to manage electrolytes. SCr up 1.25 (fluid boluses pre and post-ambisome added back 5/30).  K WNL at 4.1 on 40meq daily Mag down to 1.8 - will supplement today  Plan: Continue KCl 68mEq PT daily Magnesium 2gm IV x 1 Monitor K and Mag closely and order additional replacement prn  Salome Arnt, PharmD, BCPS Pager # [phone] 12/29/2016 7:54 AM

## 2016-12-29 NOTE — Progress Notes (Signed)
[facility] Progress Note Patient Name: William Pena DOB: August 15, 1985 MRN: [id number]   Date of Service  12/29/2016  HPI/Events of Note  Frequent loose/liquid stools. Recent ID of gluteal abscess.   eICU Interventions  Will order: 1. Place Flexiseal.      Intervention Category Intermediate Interventions: Other:  Sommer,Steven Cornelia Copa 12/29/2016, 1:09 AM

## 2016-12-29 NOTE — Progress Notes (Signed)
[facility] for Infectious Disease    Date of Admission:  12/20/2016   Total days of antibiotics 10 Day 8L-ampho/flycytosine Day 8acyclovir  OI proph bactrim/weekly azithro   ID: William Pena is a 31 y.o. male with  advanced hiv disease/AIDS admitted for perirectal abscess found to have cryptococcemia and cryptococcal meningitis. 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   Principal Problem:   Perirectal abscess Active Problems:   Chronic pain   AIDS (acquired immune deficiency syndrome) (HCC)   Hyponatremia   Normocytic anemia   Elevated LFTs   Pulmonary infiltrates   Hypomagnesemia   Encephalopathy acute   SIRS (systemic inflammatory response syndrome) (HCC)   Respiratory failure (HCC)   Rectal abscess   Cryptococcal meningoencephalitis (HCC)   Cryptococcosis (HCC)   Cavitary pneumonia   Diffuse lymphadenopathy   Drug rash   Elevated intracranial pressure   HIV disease ([place])   Lymphadenopathy of head and neck    Subjective: Remains intubated on ventilator. Mother, father and grandmother are at his bedside.   Medications:  . [START ON 12/30/2016] azithromycin  1,200 mg Per Tube Q Fri  . chlorhexidine gluconate (MEDLINE KIT)  15 mL Mouth Rinse BID  . flucytosine  1,500 mg Per Tube Q6H  . lidocaine (PF)  5 mL Other Once  . mouth rinse  15 mL Mouth Rinse 10 times per day  . potassium chloride  40 mEq Per Tube Daily  . sulfamethoxazole-trimethoprim  20 mL Per Tube Daily  . valACYclovir  1,000 mg Per Tube BID    Objective: Vital signs in last 24 hours: Temp:  [97.7 F (36.5 C)-98.3 F (36.8 C)] 98.2 F (36.8 C) (05/31 1120) Pulse Rate:  [53-74] 57 (05/31 1100) Resp:  [13-23] 14 (05/31 1100) BP:  (113-141)/(73-92) 122/83 (05/31 1100) SpO2:  [98 %-100 %] 100 % (05/31 1100) FiO2 (%):  [30 %-40 %] 30 % (05/31 0747) Weight:  [153 lb 3.5 oz (69.5 kg)] 153 lb 3.5 oz (69.5 kg) (05/31 0436)   General appearance: Remains on ventilator without sedation.  Eyes: Pupils 4/4 with very sluggish to no response to light. Left-deviated gaze on assessment.  Cardio: regular rate and rhythm, S1, S2 normal, no murmur, click, rub or gallop GI: soft, non-tender; bowel sounds normal; no masses, no organomegaly. Now with diarrhea Male genitalia: herpetic lesions appear crusted to palpation, however visually not much improvement.              Rectal abscesses - wound clean and dry. Packing with little serous drainage.  Pulses: 2+ and symmetric Skin: Skin color, texture, turgor normal. No rashes or lesions or rash improved to face since stopping Amox-Clav Lymph nodes: + cervical, inguinal, axillary lymphadenopathy Neurologic: Opens eyes to voice. Not moving extremities to command. Not attempting to communicate. Still with extension posturing noted in upper extremities, lower extremities no movement and does not respond from painful stimuli.   Lab Results  Recent Labs  12/28/16 0512 12/29/16 0304  WBC 10.1 6.7  HGB 8.8* 8.2*  HCT 27.7* 26.5*  NA 134* 137  K 4.3 4.1  CL 110 113*  CO2 19* 18*  BUN 11 18  CREATININE 1.25* 1.14   Liver Panel  Recent Labs  12/27/16 0524 12/28/16 0512  PROT 8.5* 8.3*  ALBUMIN 2.3* 2.2*  AST 36 30  ALT 57 42  ALKPHOS 144* 135*  BILITOT 0.7 0.4  BILIDIR 0.2  --   IBILI 0.5  --    Sedimentation Rate  No results for input(s): ESRSEDRATE in the last 72 hours. C-Reactive Protein No results for input(s): CRP in the last 72 hours.  Microbiology:  Studies/Results: Ct Head Wo Contrast  Result Date: 12/27/2016 CLINICAL DATA:  Cryptococcal meningitis. Unresponsive. Lumbar drain placement earlier today. EXAM: CT HEAD WITHOUT CONTRAST TECHNIQUE: Contiguous axial  images were obtained from the base of the skull through the vertex without intravenous contrast. COMPARISON:  Head CT 12/26/2016 and MRI 12/27/2016 FINDINGS: Brain: There is new abnormal hypoattenuation involving the left caudate head as well as new or worsening hypoattenuation involving the anterior left lentiform nucleus. There is at most subtle hypoattenuation inferiorly in the right basal ganglia. No acute cortical infarct, intracranial hemorrhage, midline shift, or extra-axial fluid collection is seen. The ventricles and sulci are normal in size. Vascular: No hyperdense vessel. Skull: No fracture or focal osseous lesion. Sinuses/Orbits: Minimal sphenoid sinus mucosal thickening. Clear mastoid air cells. Unremarkable orbits. Other: None. IMPRESSION: Abnormal low-density in the left basal ganglia which may reflect worsening infection or acute infarction. Electronically Signed   By: Logan Bores M.D.   On: 12/27/2016 12:13   Mr Brain Wo Contrast  Result Date: 12/27/2016 CLINICAL DATA:  Meningitis. Placement of lumbar drain earlier today with subsequent unresponsiveness. Assess for anesthesia complication or other unexpected finding. EXAM: MRI HEAD WITHOUT CONTRAST TECHNIQUE: Multiplanar, multiecho pulse sequences of the brain and surrounding structures were obtained without intravenous contrast. COMPARISON:  CT same day.  MRI same day. FINDINGS: Brain: Diffusion imaging shows persistent areas of acute infarction at the inferior basal ganglia bilaterally. There is new acute infarction affecting the caudate and putamen on the left, not present earlier today. Areas of acute infarction of the frontoparietal cortical and subcortical brain are probably progressive as well. No hydrocephalus. No extra-axial collection. Ventricular size is stable. Vascular: Flow present in the major vessels at the base of the brain. Skull and upper cervical spine: Negative Sinuses/Orbits: Sinuses are clear. Bilateral mastoid effusions  again demonstrated. Other: Hypercellular marrow pattern as seen previously. 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 Worsening of small vessel occlusive infarctions along the superior surface of the brain an underlying white matter as well. Findings are consistent with progressive complication of meningitis. Electronically Signed   By: Nelson Chimes M.D.   On: 12/27/2016 13:53   Dg Chest Port 1 View  Result Date: 12/29/2016 CLINICAL DATA:  Respiratory failure. EXAM: PORTABLE CHEST 1 VIEW COMPARISON:  12/28/2016. FINDINGS: Endotracheal tube and NG tube in stable position. Cardiomegaly unchanged. Interim increase in left base consolidation. Small left pleural effusion. No pneumothorax. IMPRESSION: 1.  Lines and tubes in stable position. 2.  Stable cardiomegaly. 3. Interim increase in left base consolidation. Small left pleural effusion. Electronically Signed   By: Marcello Moores  Register   On: 12/29/2016 07:44   Dg Chest Port 1 View  Result Date: 12/28/2016 CLINICAL DATA:  Pneumonia, HIV, perirectal abscess.  Former smoker. EXAM: PORTABLE CHEST 1 VIEW COMPARISON:  Chest x-ray of Dec 27, 2016 FINDINGS: The lungs are well-expanded. Patchy interstitial densities have improved bilaterally. The retrocardiac region is less dense and right lower lobe infiltrate has improved. There is no significant pleural effusion. The cardiac silhouette is top-normal in size. The pulmonary vascularity is less prominent. The endotracheal tube tip measures 4 cm above the carina. The esophagogastric tube tip in proximal port project below the inferior margin of the image.  IMPRESSION: Improved appearance of the lung parenchyma consistent with resolving pneumonia. The support tubes are in reasonable position. Electronically Signed   By: David  Martinique M.D.   On: 12/28/2016 07:34      Assessment/Plan: Disseminated cryptococcal disease - continue with L-ampho plus flucytosine. Lumbar drain in place - greatly appreciate neurosurgery assistance. Afebrile over night. WBC stable 10.1.   CM =  - continue on L-ampho. Oral flucytosine given via tube.  - Creatinine better today 0.88 >> 0.99 >> 1.25 >> 1.14. Continue to monitor trend. May need to increase from 500 - 1000cc to assist with tolerability as he is only halfway through induction.  -Repeat BCx 5/30 >> pending  - Will need PICC line after clearance of blood   Diarrhea =  - Afebrile w/o leukocytosis.  - CDiff, GI panel and stool culture have been collected and pending - Not likely that this is CDiff. Continue contact precautions until GI panel results.  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 D/W Neurology team and uncertain as to these events are recoverable. - Non-purposeful movements, biting ETT today. Is back on Fentanyl for comfort.   Mother, father and grandmother at bedside and were updated regarding cryptococcus infection and treatment course.   [name], MSN, NP-C [facility] [place] Cell: [phone] Pager: [phone]  12/29/2016, 11:27 AM

## 2016-12-29 NOTE — Progress Notes (Signed)
Name: William Pena MRN: [id number] DOB: Dec 18, 1985    ADMISSION DATE:  12/20/2016 CONSULTATION DATE:  5/24 REFERRING MD :  Baxter Flattery   CHIEF COMPLAINT:  Disseminated cryptococcus  BRIEF  This is a 31 year old make w/ HIV/AIDS (last CD4 1 w/ VL 81K). Just started back on ART 2 months ago however he was incarcerated and reported he was not given his medications. He was admitted on 5/22 for perirectal abscess. During admission he was also noted to have HA, fever, chills, night sweats neck pain and LAN. ID was consulted and recommended imaging of chest, LP, and cultures be sent from perirectal I&D. Since that time CT chest results: "Bilateral airspace disease with a small cavitary lesion in the right upper lobe. Findings compatible with pneumonia.Trace right pleural fluid versus right pleural thickening". Also his serum cryptococcal antigenemia titer was elevated raising the possibility of disseminated cryptococcal infection. Including lung involvement. We have been asked to see by infectious disease for bronchoscopy and washings in effort to help determine if CT changes reflect mTB OR possibly pulmonary cryptococcal disease. Initially unable to perform initial bronchoscopy due to hypoxia. 5/28 called by primary for altered LOC. He became very lethargic/obtunded requiring CT and LP, but airway was an issue so PCCM asked to see for ICU transfer and intubation.   SIGNIFICANT EVENTS   STUDIES:  CT head 5/24: nml CT chest 5/24: Bilateral airspace disease with a small cavitary lesion in the right upper lobe. Findings compatible with pneumonia. Trace right pleural fluid versus right pleural thickening. CT abd/pelvis 5/23: Edema tracking along the right side of the gluteal cleft is similar in appearance to the prior study. This reflected a subcutaneous abscess on the prior study. This most likely reflects a persistent small subcutaneous abscess. 2. Diffuse soft tissue edema tracking about the anorectal  canal, without additional abscess. Diffuse presacral soft tissue inflammation noted. 3. Scrotal wall edema, worse on the right, with small bilateral Hydroceles. 4. Mild right basilar airspace opacity raises concern for pneumonia. 5. Mild wall thickening along the distal sigmoid colon and rectum, raising concern for proctitis. 6. Enlarged azygoesophageal recess, retroperitoneal, pelvic sidewall and bilateral inguinal nodes seen, measuring up to 1.7 cm in short axis. This likely reflects the underlying infection. 7. Cholelithiasis.  Gallbladder otherwise unremarkable.  Culture data 5/23 BCX2>>> CRYPTOCOCCUS NEOFORMANS  AFB 5/28>> negative Csf 5/27>>>yeast Surgical wound culture 5/23: few wbc, few gm variable rods>>> Acid fast culture w/ reflexed sensitivities from sputum 5/23>>> afb from sputum 5/23>>> BAL 5/28 > negative BAL fungal 5/28 >> negative so far >>  BAL PCP 5/28 >> negative  ABX azith weekly 5/22>>> Ceftriaxone 5/23>>> Flagyl 5/23>>> Amphotericin B 5/23>>> Flucytosine 5/23>>> Valcyclovir 5/24 > Bactrim 5/24 >  Cefepime 5/22>>>5/23 vanc 5/23 only  SUBJECTIVE/OVERNIGHT/INTERVAL HX:  Diarrhea reported Flexible seal placed last night No significant neurological changes, remains on fentanyl drip  VITAL SIGNS: Temp:  [97.7 F (36.5 C)-98.3 F (36.8 C)] 97.9 F (36.6 C) (05/31 0900) Pulse Rate:  [53-74] 55 (05/31 1000) Resp:  [13-23] 13 (05/31 1000) BP: (113-141)/(73-92) 123/83 (05/31 1000) SpO2:  [98 %-100 %] 100 % (05/31 1000) FiO2 (%):  [30 %-40 %] 30 % (05/31 0747) Weight:  [69.5 kg (153 lb 3.5 oz)] 69.5 kg (153 lb 3.5 oz) (05/31 0436)  PHYSICAL EXAMINATION:  General:  Ill-appearing man, ventilated Neuro: Tried to open his eyes to his mother's voice, did not move for me, will not follow commands HEENT: ET tube in place, pupils equal Cardiovascular:  Regular, no murmur, no edema Lungs:  Coarse bilaterally, no crackles, no wheezing Abdomen: Soft, benign,  positive bowel sounds, flexiseal in place Musculoskeletal:  No deformity Skin: No rash, multiple tattoos   PULMONARY  Recent Labs Lab 12/23/16 1310 12/26/16 2028  PHART 7.406 7.460*  PCO2ART 31.9* 31.8*  PO2ART 72.8* 348.0*  HCO3 19.6* 22.6  TCO2  --  24  O2SAT 94.9 100.0    CBC  Recent Labs Lab 12/27/16 0524 12/28/16 0512 12/29/16 0304  HGB 9.7* 8.8* 8.2*  HCT 29.3* 27.7* 26.5*  WBC 9.7 10.1 6.7  PLT 146* 155 165    COAGULATION  Recent Labs Lab 12/22/16 1125  INR 1.20    CARDIAC  No results for input(s): TROPONINI in the last 168 hours. No results for input(s): PROBNP in the last 168 hours.   CHEMISTRY  Recent Labs Lab 12/25/16 0345 12/26/16 0230 12/27/16 0524 12/27/16 1810 12/28/16 0512 12/28/16 1605 12/29/16 0304  NA 136 136 137  --  134*  --  137  K 4.1 3.3* 3.7  --  4.3  --  4.1  CL 112* 112* 110  --  110  --  113*  CO2 18* 19* 19*  --  19*  --  18*  GLUCOSE 84 86 108*  --  146*  --  109*  BUN 6 <5* <5*  --  11  --  18  CREATININE 0.82 0.88 0.99  --  1.25*  --  1.14  CALCIUM 7.9* 8.0* 8.4*  --  8.0*  --  8.1*  MG 1.7 1.8 1.5* 3.1* 2.3 2.0 1.8  PHOS 2.2*  --   --  5.1* 4.4 4.1  --    Estimated Creatinine Clearance: 84.7 mL/min (by C-G formula based on SCr of 1.14 mg/dL).   LIVER  Recent Labs Lab 12/22/16 1125 12/27/16 0524 12/28/16 0512  AST  --  36 30  ALT  --  57 42  ALKPHOS  --  144* 135*  BILITOT  --  0.7 0.4  PROT  --  8.5* 8.3*  ALBUMIN  --  2.3* 2.2*  INR 1.20  --   --      INFECTIOUS  Recent Labs Lab 12/22/16 2053 12/23/16 1124 12/23/16 1400 12/24/16 0249 12/25/16 0345  LATICACIDVEN 1.5  --  1.3  --   --   PROCALCITON  --  10.72  --  8.72 4.24     ENDOCRINE CBG (last 3)   Recent Labs  12/29/16 0003 12/29/16 0416 12/29/16 0948  GLUCAP 120* 105* 102*     IMAGING x48h  - image(s) personally visualized  -   highlighted in bold Ct Head Wo Contrast  Result Date: 12/27/2016 CLINICAL DATA:   Cryptococcal meningitis. Unresponsive. Lumbar drain placement earlier today. EXAM: CT HEAD WITHOUT CONTRAST TECHNIQUE: Contiguous axial images were obtained from the base of the skull through the vertex without intravenous contrast. COMPARISON:  Head CT 12/26/2016 and MRI 12/27/2016 FINDINGS: Brain: There is new abnormal hypoattenuation involving the left caudate head as well as new or worsening hypoattenuation involving the anterior left lentiform nucleus. There is at most subtle hypoattenuation inferiorly in the right basal ganglia. No acute cortical infarct, intracranial hemorrhage, midline shift, or extra-axial fluid collection is seen. The ventricles and sulci are normal in size. Vascular: No hyperdense vessel. Skull: No fracture or focal osseous lesion. Sinuses/Orbits: Minimal sphenoid sinus mucosal thickening. Clear mastoid air cells. Unremarkable orbits. Other: None. IMPRESSION: Abnormal low-density in the left basal ganglia which may  reflect worsening infection or acute infarction. Electronically Signed   By: Logan Bores M.D.   On: 12/27/2016 12:13   Mr Brain Wo Contrast  Result Date: 12/27/2016 CLINICAL DATA:  Meningitis. Placement of lumbar drain earlier today with subsequent unresponsiveness. Assess for anesthesia complication or other unexpected finding. EXAM: MRI HEAD WITHOUT CONTRAST TECHNIQUE: Multiplanar, multiecho pulse sequences of the brain and surrounding structures were obtained without intravenous contrast. COMPARISON:  CT same day.  MRI same day. FINDINGS: Brain: Diffusion imaging shows persistent areas of acute infarction at the inferior basal ganglia bilaterally. There is new acute infarction affecting the caudate and putamen on the left, not present earlier today. Areas of acute infarction of the frontoparietal cortical and subcortical brain are probably progressive as well. No hydrocephalus. No extra-axial collection. Ventricular size is stable. Vascular: Flow present in the major  vessels at the base of the brain. Skull and upper cervical spine: Negative Sinuses/Orbits: Sinuses are clear. Bilateral mastoid effusions again demonstrated. Other: Hypercellular marrow pattern as seen previously. 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 Worsening of small vessel occlusive infarctions along the superior surface of the brain an underlying white matter as well. Findings are consistent with progressive complication of meningitis. Electronically Signed   By: Nelson Chimes M.D.   On: 12/27/2016 13:53   Dg Chest Port 1 View  Result Date: 12/29/2016 CLINICAL DATA:  Respiratory failure. EXAM: PORTABLE CHEST 1 VIEW COMPARISON:  12/28/2016. FINDINGS: Endotracheal tube and NG tube in stable position. Cardiomegaly unchanged. Interim increase in left base consolidation. Small left pleural effusion. No pneumothorax. IMPRESSION: 1.  Lines and tubes in stable position. 2.  Stable cardiomegaly. 3. Interim increase in left base consolidation. Small left pleural effusion. Electronically Signed   By: Marcello Moores  Register   On: 12/29/2016 07:44   Dg Chest Port 1 View  Result Date: 12/28/2016 CLINICAL DATA:  Pneumonia, HIV, perirectal abscess.  Former smoker. EXAM: PORTABLE CHEST 1 VIEW COMPARISON:  Chest x-ray of Dec 27, 2016 FINDINGS: The lungs are well-expanded. Patchy interstitial densities have improved bilaterally. The retrocardiac region is less dense and right lower lobe infiltrate has improved. There is no significant pleural effusion. The cardiac silhouette is top-normal in size. The pulmonary vascularity is less prominent. The endotracheal tube tip measures 4 cm above the carina. The esophagogastric tube tip in proximal port project below the inferior margin of the image. IMPRESSION: Improved appearance of the lung parenchyma consistent with resolving  pneumonia. The support tubes are in reasonable position. Electronically Signed   By: David  Martinique M.D.   On: 12/28/2016 07:34    DISCUSSION: 31 year old male with history HIV/AIDS admitted 5/22 from jail for fevers with known perirectal abscess. 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 5/28 worsening mental status requiring intubation for LP and CT. PCCM to assume care. Pressure on LP elevated and drained. 5/29 lumbar drain placement. Progressive changes on MRI brain 5/29, ? Ischemic infarct BG due to inflammation / cryptococcal infection.   ASSESSMENT / PLAN:  PULMONARY A: Acute hypoxemic respiratory failure Bilateral pulmonary infiltrates - disseminated cryptococcus RUL infiltrate, ? Small area cavitation > PNA  P:   Continue current vent support, consider pressure support but he does not have the mental status Rex to patient Follow chest x-ray VAP prevention order set Suspect that his infiltrates/pneumonia are either cryptococcal or bacterial. PCP DFA was negative. Continue current  probiotic coverage. 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 infectious  P:   ID following Day 8 L-ampho/flycytosine, note acute renal failure, may need to be adjusted Day 8 acyclovir Day 10 of proph bactrim/weekly azithro Airborne precautions, final AFB results pending Check stool cultures, consider C. 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 Follow mental status now that lumbar drain has been placed MRI concerning for possible irreversible basal ganglial injury Continue lumbar drainage, CSF per neurosurgery recommendations. Note that it is possible get more culture information on 6/1 if needed Appreciate neurology and neurosurgery assistance Continuing to treat underlying cause, cryptococcus   FAMILY  - Updates: Status reviewed with the patient's mother at bedside on 5/31  - Inter-disciplinary family meet or Palliative Care meeting due by:  6/3  Independent CC time 23 minutes  Baltazar Apo, MD, PhD 12/29/2016, 10:33 AM [facility] [phone] or if no answer [phone]

## 2016-12-29 NOTE — Progress Notes (Signed)
Subjective: Sedated and intubated. Now opening eyes to noxious stimuli.   Objective: Current vital signs: BP 120/88   Pulse 88   Temp 98.2 F (36.8 C) (Axillary)   Resp 16   Ht _0  (1.676 m)   Wt 69.5 kg (153 lb 3.5 oz)   SpO2 100%   BMI 24.73 kg/m  Vital signs in last 24 hours: Temp:  [97.7 F (36.5 C)-98.2 F (36.8 C)] 98.2 F (36.8 C) (05/31 1120) Pulse Rate:  [53-88] 88 (05/31 1240) Resp:  [13-23] 16 (05/31 1240) BP: (113-141)/(73-92) 120/88 (05/31 1240) SpO2:  [98 %-100 %] 100 % (05/31 1240) FiO2 (%):  [30 %] 30 % (05/31 1240) Weight:  [69.5 kg (153 lb 3.5 oz)] 69.5 kg (153 lb 3.5 oz) (05/31 0436)  Intake/Output from previous day: 05/30 0701 - 05/31 0700 In: 5938.3 [I.V.:1991.7; NG/GT:2030; IV Piggyback:1916.7] Out: 1397 [Urine:1275; Drains:122] Intake/Output this shift: Total I/O In: 1601.2 [I.V.:392.8; NG/GT:350; IV Piggyback:858.3] Out: 641 [Urine:595; Drains:46] Nutritional status: Diet NPO time specified  Neurologic Exam: Exam while intubated and sedated on fentanyl.  Ment: Opens eyes to noxious stimuli. Does not attend to visual stimuli. Does not follow commands. No attempts to communicate. No spontaneous purposeful movement.  CN: Pupils 4 mm and minimally reactive. No blink to threat. Corneal reflexes intact. Slow, low amplitude roving EOM. No nystagmus. Face flaccidly symmetric.  Motor/Sensory: At rest, upper extremities with weak tonic posturing bilaterally, fingers flexed and arms internally rotated, with decreased tone. Extensor posturing of upper extremities to sternal rub. Lower extremities flaccid with no movement to light tactile stimuli.  Reflexes: Hypoactive x 4.   Lab Results: Results for orders placed or performed during the hospital encounter of 12/20/16 (from the past 48 hour(s))  Magnesium     Status: Abnormal   Collection Time: 12/27/16  6:10 PM  Result Value Ref Range   Magnesium 3.1 (H) 1.7 - 2.4 mg/dL  Phosphorus     Status: Abnormal    Collection Time: 12/27/16  6:10 PM  Result Value Ref Range   Phosphorus 5.1 (H) 2.5 - 4.6 mg/dL  Glucose, capillary     Status: Abnormal   Collection Time: 12/27/16  8:38 PM  Result Value Ref Range   Glucose-Capillary 135 (H) 65 - 99 mg/dL  Glucose, capillary     Status: Abnormal   Collection Time: 12/27/16 11:47 PM  Result Value Ref Range   Glucose-Capillary 138 (H) 65 - 99 mg/dL  Glucose, capillary     Status: Abnormal   Collection Time: 12/28/16  3:52 AM  Result Value Ref Range   Glucose-Capillary 125 (H) 65 - 99 mg/dL  Magnesium     Status: None   Collection Time: 12/28/16  5:12 AM  Result Value Ref Range   Magnesium 2.3 1.7 - 2.4 mg/dL  Comprehensive metabolic panel     Status: Abnormal   Collection Time: 12/28/16  5:12 AM  Result Value Ref Range   Sodium 134 (L) 135 - 145 mmol/L   Potassium 4.3 3.5 - 5.1 mmol/L   Chloride 110 101 - 111 mmol/L   CO2 19 (L) 22 - 32 mmol/L   Glucose, Bld 146 (H) 65 - 99 mg/dL   BUN 11 6 - 20 mg/dL   Creatinine, Ser 1.25 (H) 0.61 - 1.24 mg/dL   Calcium 8.0 (L) 8.9 - 10.3 mg/dL   Total Protein 8.3 (H) 6.5 - 8.1 g/dL   Albumin 2.2 (L) 3.5 - 5.0 g/dL   AST 30 15 - 41  U/L   ALT 42 17 - 63 U/L   Alkaline Phosphatase 135 (H) 38 - 126 U/L   Total Bilirubin 0.4 0.3 - 1.2 mg/dL   GFR calc non Af Amer >60 >60 mL/min   GFR calc Af Amer >60 >60 mL/min    Comment: (NOTE) The eGFR has been calculated using the CKD EPI equation. This calculation has not been validated in all clinical situations. eGFR's persistently <60 mL/min signify possible Chronic Kidney Disease.    Anion gap 5 5 - 15  CBC     Status: Abnormal   Collection Time: 12/28/16  5:12 AM  Result Value Ref Range   WBC 10.1 4.0 - 10.5 K/uL   RBC 3.27 (L) 4.22 - 5.81 MIL/uL   Hemoglobin 8.8 (L) 13.0 - 17.0 g/dL   HCT 27.7 (L) 39.0 - 52.0 %   MCV 84.7 78.0 - 100.0 fL   MCH 26.9 26.0 - 34.0 pg   MCHC 31.8 30.0 - 36.0 g/dL   RDW 16.0 (H) 11.5 - 15.5 %   Platelets 155 150 - 400 K/uL   Phosphorus     Status: None   Collection Time: 12/28/16  5:12 AM  Result Value Ref Range   Phosphorus 4.4 2.5 - 4.6 mg/dL  Glucose, capillary     Status: Abnormal   Collection Time: 12/28/16  8:30 AM  Result Value Ref Range   Glucose-Capillary 142 (H) 65 - 99 mg/dL  Glucose, capillary     Status: Abnormal   Collection Time: 12/28/16 12:05 PM  Result Value Ref Range   Glucose-Capillary 154 (H) 65 - 99 mg/dL  Magnesium     Status: None   Collection Time: 12/28/16  4:05 PM  Result Value Ref Range   Magnesium 2.0 1.7 - 2.4 mg/dL  Phosphorus     Status: None   Collection Time: 12/28/16  4:05 PM  Result Value Ref Range   Phosphorus 4.1 2.5 - 4.6 mg/dL  Glucose, capillary     Status: Abnormal   Collection Time: 12/28/16  5:27 PM  Result Value Ref Range   Glucose-Capillary 125 (H) 65 - 99 mg/dL  Glucose, capillary     Status: Abnormal   Collection Time: 12/28/16  8:34 PM  Result Value Ref Range   Glucose-Capillary 117 (H) 65 - 99 mg/dL  Glucose, capillary     Status: Abnormal   Collection Time: 12/29/16 12:03 AM  Result Value Ref Range   Glucose-Capillary 120 (H) 65 - 99 mg/dL  Magnesium     Status: None   Collection Time: 12/29/16  3:04 AM  Result Value Ref Range   Magnesium 1.8 1.7 - 2.4 mg/dL  Basic metabolic panel     Status: Abnormal   Collection Time: 12/29/16  3:04 AM  Result Value Ref Range   Sodium 137 135 - 145 mmol/L   Potassium 4.1 3.5 - 5.1 mmol/L   Chloride 113 (H) 101 - 111 mmol/L   CO2 18 (L) 22 - 32 mmol/L   Glucose, Bld 109 (H) 65 - 99 mg/dL   BUN 18 6 - 20 mg/dL   Creatinine, Ser 1.14 0.61 - 1.24 mg/dL   Calcium 8.1 (L) 8.9 - 10.3 mg/dL   GFR calc non Af Amer >60 >60 mL/min   GFR calc Af Amer >60 >60 mL/min    Comment: (NOTE) The eGFR has been calculated using the CKD EPI equation. This calculation has not been validated in all clinical situations. eGFR's persistently <60 mL/min signify possible  Chronic Kidney Disease.    Anion gap 6 5 - 15  CBC      Status: Abnormal   Collection Time: 12/29/16  3:04 AM  Result Value Ref Range   WBC 6.7 4.0 - 10.5 K/uL   RBC 3.13 (L) 4.22 - 5.81 MIL/uL   Hemoglobin 8.2 (L) 13.0 - 17.0 g/dL   HCT 26.5 (L) 39.0 - 52.0 %   MCV 84.7 78.0 - 100.0 fL   MCH 26.2 26.0 - 34.0 pg   MCHC 30.9 30.0 - 36.0 g/dL   RDW 16.2 (H) 11.5 - 15.5 %   Platelets 165 150 - 400 K/uL  Glucose, capillary     Status: Abnormal   Collection Time: 12/29/16  4:16 AM  Result Value Ref Range   Glucose-Capillary 105 (H) 65 - 99 mg/dL  Glucose, capillary     Status: Abnormal   Collection Time: 12/29/16  9:48 AM  Result Value Ref Range   Glucose-Capillary 102 (H) 65 - 99 mg/dL  Glucose, capillary     Status: Abnormal   Collection Time: 12/29/16 11:18 AM  Result Value Ref Range   Glucose-Capillary 112 (H) 65 - 99 mg/dL    Recent Results (from the past 240 hour(s))  Culture, blood (Routine X 2) w Reflex to ID Panel     Status: Abnormal   Collection Time: 12/20/16  6:42 AM  Result Value Ref Range Status   Specimen Description BLOOD LEFT ANTECUBITAL  Final   Special Requests   Final    BOTTLES DRAWN AEROBIC AND ANAEROBIC Blood Culture adequate volume   Culture  Setup Time   Final    AEROBIC BOTTLE ONLY YEAST CRITICAL VALUE NOTED.  VALUE IS CONSISTENT WITH PREVIOUSLY REPORTED AND CALLED VALUE. Performed at [facility], [place] [address], [place], [place] [address]    Culture CRYPTOCOCCUS NEOFORMANS (A)  Final   Report Status 12/26/2016 FINAL  Final  Culture, blood (Routine X 2) w Reflex to ID Panel     Status: Abnormal   Collection Time: 12/20/16  6:54 AM  Result Value Ref Range Status   Specimen Description BLOOD RIGHT ANTECUBITAL  Final   Special Requests IN PEDIATRIC BOTTLE Blood Culture adequate volume  Final   Culture  Setup Time   Final    IN PEDIATRIC BOTTLE YEAST CRITICAL RESULT CALLED TO, READ BACK BY AND VERIFIED WITHJerel Shepherd PHARMD, AT 6861 12/23/16 BY Rush Landmark Performed at [facility], [place] [address] [place], [place] [address]    Culture CRYPTOCOCCUS NEOFORMANS (A)  Final   Report Status 12/25/2016 FINAL  Final  Blood Culture ID Panel (Reflexed)     Status: None   Collection Time: 12/20/16  6:54 AM  Result Value Ref Range Status   Enterococcus species NOT DETECTED NOT DETECTED Final   Listeria monocytogenes NOT DETECTED NOT DETECTED Final   Staphylococcus species NOT DETECTED NOT DETECTED Final   Staphylococcus aureus NOT DETECTED NOT DETECTED Final   Streptococcus species NOT DETECTED NOT DETECTED Final   Streptococcus agalactiae NOT DETECTED NOT DETECTED Final   Streptococcus pneumoniae NOT DETECTED NOT DETECTED Final   Streptococcus pyogenes NOT DETECTED NOT DETECTED Final   Acinetobacter baumannii NOT DETECTED NOT DETECTED Final   Enterobacteriaceae species NOT DETECTED NOT DETECTED Final   Enterobacter cloacae complex NOT DETECTED NOT DETECTED Final   Escherichia coli NOT DETECTED NOT DETECTED Final   Klebsiella oxytoca NOT DETECTED NOT DETECTED Final   Klebsiella pneumoniae NOT DETECTED NOT  DETECTED Final   Proteus species NOT DETECTED NOT DETECTED Final   Serratia marcescens NOT DETECTED NOT DETECTED Final   Haemophilus influenzae NOT DETECTED NOT DETECTED Final   Neisseria meningitidis NOT DETECTED NOT DETECTED Final   Pseudomonas aeruginosa NOT DETECTED NOT DETECTED Final   Candida albicans NOT DETECTED NOT DETECTED Final   Candida glabrata NOT DETECTED NOT DETECTED Final   Candida krusei NOT DETECTED NOT DETECTED Final   Candida parapsilosis NOT DETECTED NOT DETECTED Final   Candida tropicalis NOT DETECTED NOT DETECTED Final    Comment: Performed at [facility], [place] [address], [place], [place] [address]  Surgical pcr screen     Status: None   Collection Time: 12/21/16  2:25 AM  Result Value Ref Range Status   MRSA, PCR NEGATIVE NEGATIVE Final   Staphylococcus aureus NEGATIVE NEGATIVE Final    Comment:        The Xpert SA Assay (FDA approved for NASAL  specimens in patients over 68 years of age), is one component of a comprehensive surveillance program.  Test performance has been validated by [facility] for patients greater than or equal to 62 year old. It is not intended to diagnose infection nor to guide or monitor treatment.   Blood culture (routine x 2)     Status: Abnormal   Collection Time: 12/21/16  4:11 AM  Result Value Ref Range Status   Specimen Description BLOOD LEFT ANTECUBITAL  Final   Special Requests   Final    BOTTLES DRAWN AEROBIC ONLY Blood Culture adequate volume   Culture  Setup Time   Final    YEAST AEROBIC BOTTLE ONLY CRITICAL VALUE NOTED.  VALUE IS CONSISTENT WITH PREVIOUSLY REPORTED AND CALLED VALUE.    Culture CRYPTOCOCCUS NEOFORMANS (A)  Final   Report Status 12/26/2016 FINAL  Final  Blood culture (routine x 2)     Status: Abnormal   Collection Time: 12/21/16  4:11 AM  Result Value Ref Range Status   Specimen Description BLOOD RIGHT HAND  Final   Special Requests IN PEDIATRIC BOTTLE Blood Culture adequate volume  Final   Culture  Setup Time   Final    IN PEDIATRIC BOTTLE YEAST CRITICAL RESULT CALLED TO, READ BACK BY AND VERIFIED WITH: G.HIBBETT PHARMD, 5681 12/24/16 M.CAMPBELL    Culture CRYPTOCOCCUS NEOFORMANS (A)  Final   Report Status 12/26/2016 FINAL  Final  Blood Culture ID Panel (Reflexed)     Status: None   Collection Time: 12/21/16  4:11 AM  Result Value Ref Range Status   Enterococcus species NOT DETECTED NOT DETECTED Final   Listeria monocytogenes NOT DETECTED NOT DETECTED Final   Staphylococcus species NOT DETECTED NOT DETECTED Final   Staphylococcus aureus NOT DETECTED NOT DETECTED Final   Streptococcus species NOT DETECTED NOT DETECTED Final   Streptococcus agalactiae NOT DETECTED NOT DETECTED Final   Streptococcus pneumoniae NOT DETECTED NOT DETECTED Final   Streptococcus pyogenes NOT DETECTED NOT DETECTED Final   Acinetobacter baumannii NOT DETECTED NOT DETECTED Final    Enterobacteriaceae species NOT DETECTED NOT DETECTED Final   Enterobacter cloacae complex NOT DETECTED NOT DETECTED Final   Escherichia coli NOT DETECTED NOT DETECTED Final   Klebsiella oxytoca NOT DETECTED NOT DETECTED Final   Klebsiella pneumoniae NOT DETECTED NOT DETECTED Final   Proteus species NOT DETECTED NOT DETECTED Final   Serratia marcescens NOT DETECTED NOT DETECTED Final   Haemophilus influenzae NOT DETECTED NOT DETECTED Final   Neisseria meningitidis NOT DETECTED NOT DETECTED  Final   Pseudomonas aeruginosa NOT DETECTED NOT DETECTED Final   Candida albicans NOT DETECTED NOT DETECTED Final   Candida glabrata NOT DETECTED NOT DETECTED Final   Candida krusei NOT DETECTED NOT DETECTED Final   Candida parapsilosis NOT DETECTED NOT DETECTED Final   Candida tropicalis NOT DETECTED NOT DETECTED Final  Aerobic/Anaerobic Culture (surgical/deep wound)     Status: Abnormal   Collection Time: 12/21/16  1:39 PM  Result Value Ref Range Status   Specimen Description ABSCESS  Final   Special Requests   Final    PERIANAL PATIENT ON FOLLOWING  SEPTRA DS AND ZITHROMAX   Gram Stain   Final    FEW WBC PRESENT,BOTH PMN AND MONONUCLEAR FEW GRAM VARIABLE ROD    Culture (A)  Final    MULTIPLE ORGANISMS PRESENT, NONE PREDOMINANT MIXED ANAEROBIC FLORA PRESENT.  CALL LAB IF FURTHER IID REQUIRED.    Report Status 12/27/2016 FINAL  Final  CSF culture with Stat gram stain     Status: None   Collection Time: 12/22/16  9:29 AM  Result Value Ref Range Status   Specimen Description BACK  Final   Special Requests Immunocompromised  Final   Gram Stain   Final    CYTOSPIN SMEAR WBC PRESENT, PREDOMINANTLY MONONUCLEAR YEAST CRITICAL RESULT CALLED TO, READ BACK BY AND VERIFIED WITH: RN Morton Peters [phone]    Culture ABUNDANT CRYPTOCOCCUS NEOFORMANS  Final   Report Status 12/25/2016 FINAL  Final  Culture, fungus without smear     Status: Abnormal (Preliminary result)   Collection Time: 12/22/16   9:51 AM  Result Value Ref Range Status   Specimen Description CSF  Final   Special Requests Immunocompromised  Final   Culture CRYPTOCOCCUS NEOFORMANS (A)  Final   Report Status PENDING  Incomplete  CSF culture with Stat gram stain     Status: Abnormal   Collection Time: 12/25/16  5:04 PM  Result Value Ref Range Status   Specimen Description CSF  Final   Special Requests Immunocompromised  Final   Gram Stain   Final    WBC PRESENT,BOTH PMN AND MONONUCLEAR BUDDING YEAST SEEN CYTOSPIN SMEAR CRITICAL RESULT CALLED TO, READ BACK BY AND VERIFIED WITH: L TURNER,RN AT 1821 12/25/16 BY L BENFIELD    Culture CRYPTOCOCCUS NEOFORMANS (A)  Final   Report Status 12/29/2016 FINAL  Final  Culture, fungus without smear     Status: Abnormal (Preliminary result)   Collection Time: 12/25/16  5:05 PM  Result Value Ref Range Status   Specimen Description CSF  Final   Special Requests Immunocompromised  Final   Culture CRYPTOCOCCUS NEOFORMANS (A)  Final   Report Status PENDING  Incomplete  Acid Fast Smear (AFB)     Status: None   Collection Time: 12/26/16  7:06 PM  Result Value Ref Range Status   AFB Specimen Processing Concentration  Final   Acid Fast Smear Negative  Final    Comment: (NOTE) Performed At: [facility] [address] [place], [place] [id number] Lindon Romp MD NO:[id number]    Source (AFB) BRONCHIAL ALVEOLAR LAVAGE  Final  Culture, fungus without smear     Status: None (Preliminary result)   Collection Time: 12/26/16  7:06 PM  Result Value Ref Range Status   Specimen Description BRONCHIAL ALVEOLAR LAVAGE  Final   Special Requests Immunocompromised  Final   Culture NO FUNGUS ISOLATED AFTER 2 DAYS  Final   Report Status PENDING  Incomplete  Culture, bal-quantitative  Status: None   Collection Time: 12/26/16  7:06 PM  Result Value Ref Range Status   Specimen Description BRONCHIAL ALVEOLAR LAVAGE  Final   Special Requests Immunocompromised  Final   Gram Stain    Final    RARE WBC PRESENT, PREDOMINANTLY PMN NO ORGANISMS SEEN    Culture NO GROWTH 2 DAYS  Final   Report Status 12/29/2016 FINAL  Final  Pneumocystis smear by DFA     Status: None   Collection Time: 12/26/16  7:06 PM  Result Value Ref Range Status   Specimen Source-[facility] BRONCHIAL ALVEOLAR LAVAGE  Final   Pneumocystis jiroveci Ag NEGATIVE  Final    Comment: Performed at [facility]    Lipid Panel  Recent Labs  12/26/16 1902  TRIG 144    Studies/Results: Mr Brain Wo Contrast  Result Date: 12/27/2016 CLINICAL DATA:  Meningitis. Placement of lumbar drain earlier today with subsequent unresponsiveness. Assess for anesthesia complication or other unexpected finding. EXAM: MRI HEAD WITHOUT CONTRAST TECHNIQUE: Multiplanar, multiecho pulse sequences of the brain and surrounding structures were obtained without intravenous contrast. COMPARISON:  CT same day.  MRI same day. FINDINGS: Brain: Diffusion imaging shows persistent areas of acute infarction at the inferior basal ganglia bilaterally. There is new acute infarction affecting the caudate and putamen on the left, not present earlier today. Areas of acute infarction of the frontoparietal cortical and subcortical brain are probably progressive as well. No hydrocephalus. No extra-axial collection. Ventricular size is stable. Vascular: Flow present in the major vessels at the base of the brain. Skull and upper cervical spine: Negative Sinuses/Orbits: Sinuses are clear. Bilateral mastoid effusions again demonstrated. Other: Hypercellular marrow pattern as seen previously. 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 Worsening of small vessel occlusive infarctions along the superior surface of the brain an underlying white matter as well. Findings are consistent with progressive  complication of meningitis. Electronically Signed   By: Nelson Chimes M.D.   On: 12/27/2016 13:53   Dg Chest Port 1 View  Result Date: 12/29/2016 CLINICAL DATA:  Respiratory failure. EXAM: PORTABLE CHEST 1 VIEW COMPARISON:  12/28/2016. FINDINGS: Endotracheal tube and NG tube in stable position. Cardiomegaly unchanged. Interim increase in left base consolidation. Small left pleural effusion. No pneumothorax. IMPRESSION: 1.  Lines and tubes in stable position. 2.  Stable cardiomegaly. 3. Interim increase in left base consolidation. Small left pleural effusion. Electronically Signed   By: Marcello Moores  Register   On: 12/29/2016 07:44   Dg Chest Port 1 View  Result Date: 12/28/2016 CLINICAL DATA:  Pneumonia, HIV, perirectal abscess.  Former smoker. EXAM: PORTABLE CHEST 1 VIEW COMPARISON:  Chest x-ray of Dec 27, 2016 FINDINGS: The lungs are well-expanded. Patchy interstitial densities have improved bilaterally. The retrocardiac region is less dense and right lower lobe infiltrate has improved. There is no significant pleural effusion. The cardiac silhouette is top-normal in size. The pulmonary vascularity is less prominent. The endotracheal tube tip measures 4 cm above the carina. The esophagogastric tube tip in proximal port project below the inferior margin of the image. IMPRESSION: Improved appearance of the lung parenchyma consistent with resolving pneumonia. The support tubes are in reasonable position. Electronically Signed   By: David  Martinique M.D.   On: 12/28/2016 07:34    Medications:   Current Facility-Administered Medications:  .  0.9 %  sodium chloride infusion, , Intravenous,  Continuous, Elgergawy, Silver Huguenin, MD, Last Rate: 75 mL/hr at 12/29/16 0700 .  acetaminophen (TYLENOL) solution 650 mg, 650 mg, Per Tube, Q6H PRN, Halford Chessman, Vineet, MD .  amphotericin B liposome (AMBISOME) 240 mg in dextrose 5 % 500 mL IVPB, 3.5 mg/kg, Intravenous, Q24H, Byrum, Rose Fillers, MD, Last Rate: 250 mL/hr at 12/29/16 1059, 240  mg at 12/29/16 1059 .  [START ON 12/30/2016] azithromycin (ZITHROMAX) 200 MG/5ML suspension 1,200 mg, 1,200 mg, Per Tube, Q Fri, Sood, Elisabeth Cara, MD .  chlorhexidine gluconate (MEDLINE KIT) (PERIDEX) 0.12 % solution 15 mL, 15 mL, Mouth Rinse, BID, Halford Chessman, Vineet, MD, 15 mL at 12/29/16 0910 .  [DISCONTINUED] diphenhydrAMINE (BENADRYL) capsule 25 mg, 25 mg, Oral, Q6H PRN, 25 mg at 12/24/16 0315 **OR** diphenhydrAMINE (BENADRYL) injection 25 mg, 25 mg, Intravenous, Q6H PRN, Rayburn, Kelly A, PA-C, 25 mg at 12/22/16 2229 .  docusate (COLACE) 50 MG/5ML liquid 100 mg, 100 mg, Per Tube, BID PRN, Chesley Mires, MD .  famotidine (PEPCID) IVPB 20 mg premix, 20 mg, Intravenous, Q12H, Chesley Mires, MD, Stopped at 12/29/16 1012 .  feeding supplement (VITAL AF 1.2 CAL) liquid 1,000 mL, 1,000 mL, Per Tube, Continuous, Sood, Vineet, MD, Last Rate: 70 mL/hr at 12/29/16 0700, 1,000 mL at 12/29/16 0700 .  fentaNYL (SUBLIMAZE) bolus via infusion 50 mcg, 50 mcg, Intravenous, Q1H PRN, Chesley Mires, MD, 75 mcg at 12/27/16 0933 .  fentaNYL 2525mg in NS 256m(1067mml) infusion-PREMIX, 25-400 mcg/hr, Intravenous, Continuous, SooChesley MiresD, Stopped at 12/29/16 1034 .  flucytosine (ANCOBON) capsule 1,500 mg, 1,500 mg, Per Tube, Q6H, SooChesley MiresD, 1,500 mg at 12/29/16 1211 .  lidocaine (PF) (XYLOCAINE) 1 % injection 5 mL, 5 mL, Other, Once, Dixon, SteNorthrop GrummanP .  MEDLINE mouth rinse, 15 mL, Mouth Rinse, 10 times per day, SooChesley MiresD, 15 mL at 12/29/16 1242 .  midazolam (VERSED) injection 2 mg, 2 mg, Intravenous, Q2H PRN, SooChesley MiresD, 2 mg at 12/28/16 0056 .  [DISCONTINUED] ondansetron (ZOFRAN-ODT) disintegrating tablet 4 mg, 4 mg, Oral, Q6H PRN **OR** ondansetron (ZOFRAN) injection 4 mg, 4 mg, Intravenous, Q6H PRN, Rayburn, Kelly A, PA-C, 4 mg at 12/22/16 1939 .  polyethylene glycol (MIRALAX / GLYCOLAX) packet 17 g, 17 g, Oral, Daily PRN, Rayburn, Kelly A, PA-C .  potassium chloride 20 MEQ/15ML (10%) solution 40  mEq, 40 mEq, Per Tube, Daily, BaiRomona CurlsPH, 40 mEq at 12/29/16 [id number] propofol (DIPRIVAN) 1000 MG/100ML infusion, 0-50 mcg/kg/min, Intravenous, Continuous, SooChesley MiresD, Stopped at 12/27/16 1030 .  sodium chloride 0.9 % bolus 500 mL, 500 mL, Intravenous, Q24H, Byrum, RobRose FillersD, Stopped at 12/29/16 1006 .  sodium chloride 0.9 % bolus 500 mL, 500 mL, Intravenous, Q24H, Byrum, RobRose FillersD, Stopped at 12/28/16 1522 .  sulfamethoxazole-trimethoprim (BACTRIM,SEPTRA) 200-40 MG/5ML suspension 20 mL, 20 mL, Per Tube, Daily, Dixon, Stephanie N, NP, 20 mL at 12/29/16 0942 .  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 Symptoms did seem to improve after lumbar puncture. CSF positive for cryptococcalmeningitis.  1. CSF culture shows budding yeast and is cryptococcal Ag positive. Blood cultures also positive for cryptococcus. ID is following. On IV amphotericin B and flucytosine per NGT.  2. MRI #2 on 5/29 showed worsening/progression of small vessel occlusive infarctions along the base of the  brain bilaterally with new involvement affecting a large portion of the corpus striatum on the left. Worsening of small vessel occlusive infarctions along the superior surface of the brain and underlying white matter as well. 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  Recommendations: 1. On day 8 of IV amphotericin B and flucytosine per NGT. ID following. Has acute renal failure and dosing may need to be adjusted.  2. Also on ABX for perirectal abscess 3. On Valacyclovir for penile herpes 4. Status post lumbar drain placement by Dr. Kathyrn Sheriff. Clear, slightly straw-colored CSF drainage noted today.  5. Slightly improved today  as eyes now open to noxious stimuli and also with some movement, although this is posturing with no purposeful component. Neurology will continue to follow   LOS: 8 days   _0  signed: Dr. Kerney Elbe 12/29/2016  1:04 PM

## 2016-12-29 NOTE — Progress Notes (Signed)
Lumbar drain remains functional, output ~10q2 hrs. Straw colored CSF. Neurologically unchanged. - Can re-sample CSF tomorrow am if needed by neurology/ID

## 2016-12-29 NOTE — Progress Notes (Signed)
Stopped by to see pt's mom, but no family were in rm. She had spread prayer shawl I'd given her over her son in his bed. Although the chart says "unknown" for religion, the family is [place]. Will stop back by another time.   12/29/16 0900  Clinical Encounter Type  Visited With Patient not available  Visit Type Follow-up;Psychological support;Spiritual support;Social support  Referral From [place] Anaaya Fuster, Chaplain

## 2016-12-30 ENCOUNTER — Inpatient Hospital Stay (HOSPITAL_COMMUNITY): Payer: Medicaid Other

## 2016-12-30 LAB — CSF CELL COUNT WITH DIFFERENTIAL
Lymphs, CSF: 92 % — ABNORMAL HIGH (ref 40–80)
Monocyte-Macrophage-Spinal Fluid: 6 % — ABNORMAL LOW (ref 15–45)
RBC COUNT CSF: 2900 /mm3 — AB
Segmented Neutrophils-CSF: 2 % (ref 0–6)
TUBE #: 1
WBC, CSF: 163 /mm3 (ref 0–5)

## 2016-12-30 LAB — GASTROINTESTINAL PANEL BY PCR, STOOL (REPLACES STOOL CULTURE)

## 2016-12-30 LAB — BASIC METABOLIC PANEL
ANION GAP: 5 (ref 5–15)
BUN: 17 mg/dL (ref 6–20)
CHLORIDE: 112 mmol/L — AB (ref 101–111)
CO2: 19 mmol/L — ABNORMAL LOW (ref 22–32)
CREATININE: 0.99 mg/dL (ref 0.61–1.24)
Calcium: 8.4 mg/dL — ABNORMAL LOW (ref 8.9–10.3)
GFR calc non Af Amer: >60 mL/min (ref >60)
Glucose, Bld: 103 mg/dL — ABNORMAL HIGH (ref 65–99)
Potassium: 4.3 mmol/L (ref 3.5–5.1)
SODIUM: 136 mmol/L (ref 135–145)

## 2016-12-30 LAB — GLUCOSE, CAPILLARY
Glucose-Capillary: 105 mg/dL — ABNORMAL HIGH (ref 65–99)
Glucose-Capillary: 99 mg/dL (ref 65–99)
Glucose-Capillary: 115 mg/dL — ABNORMAL HIGH (ref 65–99)
Glucose-Capillary: 85 mg/dL (ref 65–99)
Glucose-Capillary: 87 mg/dL (ref 65–99)

## 2016-12-30 LAB — ACID FAST SMEAR (AFB)

## 2016-12-30 LAB — CRYPTOCOCCAL ANTIGEN, CSF
CRYPTO AG: POSITIVE — AB
CRYPTOCOCCAL AG TITER: 1280 — AB

## 2016-12-30 LAB — ACID FAST SMEAR (AFB, MYCOBACTERIA): Acid Fast Smear: NEGATIVE

## 2016-12-30 LAB — MTB NAA WITHOUT AFB CULTURE

## 2016-12-30 LAB — MAGNESIUM: Magnesium: 1.6 mg/dL — ABNORMAL LOW (ref 1.7–2.4)

## 2016-12-30 MED ORDER — MAGNESIUM SULFATE 2 GM/50ML IV SOLN
2.0000 g | Freq: Once | INTRAVENOUS | Status: AC
  Administered 2016-12-30: 2 g via INTRAVENOUS
  Filled 2016-12-30: qty 50

## 2016-12-30 MED ORDER — DEXTROSE 5 % IV SOLN
4.0000 mg/kg | INTRAVENOUS | Status: DC
  Administered 2016-12-31 – 2017-01-05 (×6): 270 mg via INTRAVENOUS
  Filled 2016-12-30 (×7): qty 270

## 2016-12-30 MED ORDER — SULFAMETHOXAZOLE-TRIMETHOPRIM 200-40 MG/5ML PO SUSP
10.0000 mL | Freq: Every day | ORAL | Status: DC
  Administered 2016-12-31 – 2017-01-12 (×13): 10 mL
  Filled 2016-12-30 (×13): qty 10

## 2016-12-30 NOTE — Progress Notes (Signed)
Pharmacy Antibiotic Note  William Pena is a 31 y.o. 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  Pharmacy has been consulted for liposomal amphotericin b dosing. Patient remains slow to respond to therapy, after discussion with Dr. Baxter Flattery will increase Ambisome to 4 mg/kg (based on admission weight, 65 kg). Since admission patient has gained 7 kg, attributed to large volume of fluid he has received. Current creatinine has improved 0.99 and electrolytes are stable.   Plan: - Increase Ambisome to 270 mg (~4mg /kg using admission weight) IV q24h - Continue flucytosine 1500 mg PO Q6h - Continue pre and post 500 mL sodium chloride boluses - Monitor renal function, electrolytes and response to therapy  Height: 5\' 6"  (167.6 cm) Weight: 159 lb 6.3 oz (72.3 kg) IBW/kg (Calculated) : 63.8  Temp (24hrs), Avg:98.4 F (36.9 C), Min:97.9 F (36.6 C), Max:99.3 F (37.4 C)   Recent Labs Lab 12/25/16 0345 12/26/16 0230 12/27/16 0524 12/28/16 0512 12/29/16 0304 12/30/16 0340  WBC 8.7 6.9 9.7 10.1 6.7  --   CREATININE 0.82 0.88 0.99 1.25* 1.14 0.99    Estimated Creatinine Clearance: 97.6 mL/min (by C-G formula based on SCr of 0.99 mg/dL).    Allergies  Allergen Reactions  . 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  Dimitri Ped, PharmD, BCPS PGY-2 Infectious Diseases Pharmacy Resident Pager: [phone] 12/30/2016 3:05 PM

## 2016-12-30 NOTE — Progress Notes (Signed)
Lumbar drain remains functional. Neuro unchanged

## 2016-12-30 NOTE — Progress Notes (Signed)
Name: William Pena MRN: [id number] DOB: Oct 18, 1985    ADMISSION DATE:  12/20/2016 CONSULTATION DATE:  5/24 REFERRING MD :  Baxter Flattery   CHIEF COMPLAINT:  Disseminated cryptococcus  BRIEF This is a 31 year old man w/ HIV/AIDS (last CD4 1 w/ VL 81K). Just started back on ART 2 months ago however he was incarcerated and reported he was not given his medications. He was admitted on 5/22 for perirectal abscess. During admission he was also noted to have HA, fever, chills, night sweats neck pain and LAN. ID was consulted and recommended imaging of chest, LP, and cultures be sent from perirectal I&D. Since that time CT chest results: "Bilateral airspace disease with a small cavitary lesion in the right upper lobe. Findings compatible with pneumonia.Trace right pleural fluid versus right pleural thickening". Also his serum cryptococcal antigenemia titer was elevated raising the possibility of disseminated cryptococcal infection. Including lung involvement. We have been asked to see by infectious disease for bronchoscopy and washings in effort to help determine if CT changes reflect mTB OR possibly pulmonary cryptococcal disease. Initially unable to perform initial bronchoscopy due to hypoxia. 5/28 called by primary for altered LOC. He became very lethargic/obtunded requiring CT and LP, but airway was an issue so PCCM asked to see for ICU transfer and intubation.   SIGNIFICANT EVENTS   STUDIES:  CT head 5/24: nml CT chest 5/24: Bilateral airspace disease with a small cavitary lesion in the right upper lobe. Findings compatible with pneumonia. Trace right pleural fluid versus right pleural thickening. CT abd/pelvis 5/23: Edema tracking along the right side of the gluteal cleft is similar in appearance to the prior study. This reflected a subcutaneous abscess on the prior study. This most likely reflects a persistent small subcutaneous abscess. 2. Diffuse soft tissue edema tracking about the anorectal  canal, without additional abscess. Diffuse presacral soft tissue inflammation noted. 3. Scrotal wall edema, worse on the right, with small bilateral Hydroceles. 4. Mild right basilar airspace opacity raises concern for pneumonia. 5. Mild wall thickening along the distal sigmoid colon and rectum, raising concern for proctitis. 6. Enlarged azygoesophageal recess, retroperitoneal, pelvic sidewall and bilateral inguinal nodes seen, measuring up to 1.7 cm in short axis. This likely reflects the underlying infection. 7. Cholelithiasis.  Gallbladder otherwise unremarkable.  Culture data 5/23 BCX2>>> CRYPTOCOCCUS NEOFORMANS  AFB 5/28>> negative Csf 5/27>>>yeast Surgical wound culture 5/23: few wbc, few gm variable rods>>> Acid fast culture w/ reflexed sensitivities from sputum 5/23>>> afb from sputum 5/23>>> BAL 5/28 > negative BAL fungal 5/28 >> negative so far >>  BAL PCP 5/28 >> negative C diff 5/31 >> negative  ABX azith weekly 5/22>>> Ceftriaxone 5/23>>> Flagyl 5/23>>> Amphotericin B 5/23>>> Flucytosine 5/23>>> Valcyclovir 5/24 > Bactrim 5/24 >  Cefepime 5/22>>>5/23 vanc 5/23 only  SUBJECTIVE/OVERNIGHT/INTERVAL HX:  Tolerating pressure support this morning Localizing on the right per RN report  VITAL SIGNS: Temp:  [97.9 F (36.6 C)-99.3 F (37.4 C)] 99.3 F (37.4 C) (06/01 0758) Pulse Rate:  [55-88] 66 (06/01 0758) Resp:  [11-18] 12 (06/01 0758) BP: (118-136)/(75-89) 125/76 (06/01 0758) SpO2:  [100 %] 100 % (06/01 0758) FiO2 (%):  [30 %] 30 % (06/01 0758) Weight:  [72.3 kg (159 lb 6.3 oz)] 72.3 kg (159 lb 6.3 oz) (06/01 0500)  PHYSICAL EXAMINATION:  General:  Ill-appearing man, ventilated Neuro: Opened eyes to voice, may be able to localize on the right, did not follow commands HEENT: ET tube in place, pupils equal Cardiovascular:  Regular, no murmur,  no edema Lungs:  Coarse bilaterally, no wheeze Abdomen: Soft, benign, positive bowel sounds Musculoskeletal:  No  deformities Skin: No rash, multiple tattoos   PULMONARY  Recent Labs Lab 12/23/16 1310 12/26/16 2028  PHART 7.406 7.460*  PCO2ART 31.9* 31.8*  PO2ART 72.8* 348.0*  HCO3 19.6* 22.6  TCO2  --  24  O2SAT 94.9 100.0    CBC  Recent Labs Lab 12/27/16 0524 12/28/16 0512 12/29/16 0304  HGB 9.7* 8.8* 8.2*  HCT 29.3* 27.7* 26.5*  WBC 9.7 10.1 6.7  PLT 146* 155 165    COAGULATION No results for input(s): INR in the last 168 hours.  CARDIAC  No results for input(s): TROPONINI in the last 168 hours. No results for input(s): PROBNP in the last 168 hours.   CHEMISTRY  Recent Labs Lab 12/25/16 0345 12/26/16 0230 12/27/16 0524 12/27/16 1810 12/28/16 0512 12/28/16 1605 12/29/16 0304 12/30/16 0340  NA 136 136 137  --  134*  --  137 136  K 4.1 3.3* 3.7  --  4.3  --  4.1 4.3  CL 112* 112* 110  --  110  --  113* 112*  CO2 18* 19* 19*  --  19*  --  18* 19*  GLUCOSE 84 86 108*  --  146*  --  109* 103*  BUN 6 <5* <5*  --  11  --  18 17  CREATININE 0.82 0.88 0.99  --  1.25*  --  1.14 0.99  CALCIUM 7.9* 8.0* 8.4*  --  8.0*  --  8.1* 8.4*  MG 1.7 1.8 1.5* 3.1* 2.3 2.0 1.8 1.6*  PHOS 2.2*  --   --  5.1* 4.4 4.1  --   --    Estimated Creatinine Clearance: 97.6 mL/min (by C-G formula based on SCr of 0.99 mg/dL).   LIVER  Recent Labs Lab 12/27/16 0524 12/28/16 0512  AST 36 30  ALT 57 42  ALKPHOS 144* 135*  BILITOT 0.7 0.4  PROT 8.5* 8.3*  ALBUMIN 2.3* 2.2*     INFECTIOUS  Recent Labs Lab 12/23/16 1124 12/23/16 1400 12/24/16 0249 12/25/16 0345  LATICACIDVEN  --  1.3  --   --   PROCALCITON 10.72  --  8.72 4.24     ENDOCRINE CBG (last 3)   Recent Labs  12/29/16 2344 12/30/16 0349 12/30/16 0754  GLUCAP 98 105* 99     IMAGING x48h  - image(s) personally visualized  -   highlighted in bold Dg Chest Port 1 View  Result Date: 12/30/2016 CLINICAL DATA:  Respiratory failure. EXAM: PORTABLE CHEST 1 VIEW COMPARISON:  12/29/2016. FINDINGS: Endotracheal  tube and NG tube in stable position. Cardiomegaly. Low lung volumes. Questionable mild left upper and left mid lung infiltrate. No pleural effusion or pneumothorax. IMPRESSION: 1. Lines and tubes stable position. 2. Questionable mild left upper and left mid lung field infiltrates. Electronically Signed   By: Marcello Moores  Register   On: 12/30/2016 07:57   Dg Chest Port 1 View  Result Date: 12/29/2016 CLINICAL DATA:  Respiratory failure. EXAM: PORTABLE CHEST 1 VIEW COMPARISON:  12/28/2016. FINDINGS: Endotracheal tube and NG tube in stable position. Cardiomegaly unchanged. Interim increase in left base consolidation. Small left pleural effusion. No pneumothorax. IMPRESSION: 1.  Lines and tubes in stable position. 2.  Stable cardiomegaly. 3. Interim increase in left base consolidation. Small left pleural effusion. Electronically Signed   By: Marcello Moores  Register   On: 12/29/2016 07:44    DISCUSSION: 31 year old male with history  HIV/AIDS [facility]/22 from jail for fevers with known perirectal abscess. 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 5/28 worsening mental status requiring intubation for LP and CT. PCCM to assume care. Pressure on LP elevated and drained. 5/29 lumbar drain placement. Progressive changes on MRI brain 5/29, ? Ischemic infarct BG due to inflammation / cryptococcal infection.   ASSESSMENT / PLAN:  PULMONARY A: Acute hypoxemic respiratory failure Bilateral pulmonary infiltrates - disseminated cryptococcus RUL infiltrate, ? Small area cavitation > PNA  P:   Continue her support as he can tolerate. Clearly does not have the mental status to tolerate extubation at this time. Suspect that he may require tracheostomy in order to follow neurological improvement for an extended period. This may be reasonable if neurology believes he has a chance to recover.  Follow chest x-ray VAP prevention or set Bilateral infiltrates likely cryptococcal or bacterial. PCP DFA was  negative. Continue current antibiotic coverage. Appreciate ID assistance.  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) Hypokalemia  P:   Follow BMP, urine output Amphotericin dosing per pharmacy recommendations, per renal function  GASTROINTESTINAL A:   Diarrhea  P:   Rectal tube in place Stool studies pending for enteric pathogens PPI prophylaxis Nothing by mouth  HEMATOLOGIC A:   Anemia, chronic (Hgb at baseline)  P:  Follow CBC Heparin prophylaxis SCD  INFECTIOUS A:   Disseminated cryptococcus, including meningitis, possibly pneumonia Bilateral pulmonary infiltrates, pneumonia with micronodular cavitation No current evidence tuberculosis based on culture data but at risk No current evidence PCP, but at risk Perirectal abscess, drained HIV/AIDS (CD4 count 21) Genital herpes Diarrhea, ? infectious  P:   Appreciate ID management Day 9 L-ampho/flycytosine, note acute renal failure, may need to be adjusted Day 9 acyclovir Day 11 of proph bactrim/weekly azithro Airborne precautions have been lifted with negative AFB Stool culture panel pending. C. 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 Appreciate assistance Appreciate neurology evaluation, particularly with regard to prognosis here. Continuing to treat underlying cause, cryptococcus   FAMILY  - Updates: Status reviewed with the patient's mother at bedside on 5/31  - Inter-disciplinary family meet or Palliative Care meeting due by:  6/3  Independent CC time 33  minutes  Baltazar Apo, MD, PhD 12/30/2016, 10:59 AM [facility] [phone] or if no answer [phone]

## 2016-12-30 NOTE — Progress Notes (Signed)
[facility] for Infectious Disease    Date of Admission:  12/20/2016   Total days of antibiotics 11 Day 9L-ampho/flycytosine Day 9acyclovir  OI proph bactrim/weekly azithro   ID: William Pena is a 31 y.o. 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   Principal Problem:   Perirectal abscess Active Problems:   Chronic pain   AIDS (acquired immune deficiency syndrome) (HCC)   Hyponatremia   Normocytic anemia   Elevated LFTs   Pulmonary infiltrates   Hypomagnesemia   Encephalopathy acute   SIRS (systemic inflammatory response syndrome) (HCC)   Acute respiratory failure (HCC)   Rectal abscess   Cryptococcal meningoencephalitis (HCC)   Cryptococcosis (HCC)   Cavitary pneumonia   Diffuse lymphadenopathy   Drug rash   Elevated intracranial pressure   HIV disease ([place])   Lymphadenopathy of head and neck    Subjective: Remains intubated on ventilator. Opens eyes occasionally. Mother, father and grandmother at his bedside.   Medications:  . azithromycin  1,200 mg Per Tube Q Fri  . chlorhexidine gluconate (MEDLINE KIT)  15 mL Mouth Rinse BID  . flucytosine  1,500 mg Per Tube Q6H  . lidocaine (PF)  5 mL Other Once  . mouth rinse  15 mL Mouth Rinse 10 times per day  . potassium chloride  40 mEq Per Tube Daily  . sulfamethoxazole-trimethoprim  20 mL Per Tube Daily  . valACYclovir  1,000 mg Per Tube BID    Objective: Vital signs in last 24 hours: Temp:  [97.9 F (36.6 C)-99.3 F (37.4 C)] 98.3 F (36.8 C) (06/01 1200) Pulse Rate:  [57-91] 91 (06/01 1226) Resp:  [11-18] 15 (06/01  1226) BP: (118-136)/(75-89) 129/75 (06/01 1226) SpO2:  [100 %] 100 % (06/01 1226) FiO2 (%):  [30 %] 30 % (06/01 1226) Weight:  [159 lb 6.3 oz (72.3 kg)] 159 lb 6.3 oz (72.3 kg) (06/01 0500)  General appearance: Remains on ventilator. Receiving some sedation today for comfort. Appears calmer.   Eyes: Pupils 4/4 with very sluggish to no response to light. Cardio: regular rate and rhythm, S1, S2 normal, no murmur, click, rub or gallop. Occasionally bradycardic GI: soft, non-tender; bowel sounds normal; no masses, no organomegaly. Diarrhea Male genitalia: herpetic lesions appear crusted to palpation, however visually not much improvement.  Rectal abscess - wound clean and dry. Packing with little serous drainage. Flexiseal containing stool well.  Pulses: 2+ and symmetric Skin: Skin color, texture, turgor normal. No rashes or lesions Lymph nodes: + cervical, inguinal, axillary lymphadenopathy Neurologic: Opens eyes to voice. Not moving extremities to command. Not attempting to communicate. No response with firm nailbed pressure to LEs bilaterally.   Lab Results  Recent Labs  12/28/16 0512 12/29/16 0304 12/30/16 0340  WBC 10.1 6.7  --   HGB 8.8* 8.2*  --   HCT 27.7* 26.5*  --   NA 134* 137 136  K 4.3 4.1 4.3  CL 110 113* 112*  CO2 19* 18* 19*  BUN _0 CREATININE 1.25* 1.14 0.99   Liver Panel  Recent Labs  12/28/16 0512  PROT 8.3*  ALBUMIN 2.2*  AST 30  ALT 42  ALKPHOS 135*  BILITOT 0.4   Sedimentation Rate No results for input(s): ESRSEDRATE in the last 72 hours. C-Reactive Protein No results for input(s): CRP in the last 72 hours.  Microbiology: BCx 5/30 >> NGTD   Studies/Results:  Dg Chest Port 1 View  Result Date: 12/30/2016 CLINICAL DATA:  Respiratory failure. EXAM: PORTABLE CHEST 1 VIEW COMPARISON:  12/29/2016. FINDINGS: Endotracheal tube and NG tube in stable position. Cardiomegaly. Low lung volumes. Questionable mild left upper and left mid lung  infiltrate. No pleural effusion or pneumothorax. IMPRESSION: 1. Lines and tubes stable position. 2. Questionable mild left upper and left mid lung field infiltrates. Electronically Signed   By: Marcello Moores  Register   On: 12/30/2016 07:57   Dg Chest Port 1 View  Result Date: 12/29/2016 CLINICAL DATA:  Respiratory failure. EXAM: PORTABLE CHEST 1 VIEW COMPARISON:  12/28/2016. FINDINGS: Endotracheal tube and NG tube in stable position. Cardiomegaly unchanged. Interim increase in left base consolidation. Small left pleural effusion. No pneumothorax. IMPRESSION: 1.  Lines and tubes in stable position. 2.  Stable cardiomegaly. 3. Interim increase in left base consolidation. Small left pleural effusion. Electronically Signed   By: Marcello Moores  Register   On: 12/29/2016 07:44     Assessment/Plan: Disseminated cryptococcal disease - continue with L-ampho plus flucytosine. Lumbar drain in place - greatly appreciate neurosurgery assistance. Afebrile over night. WBC stable 10.1.    CM =  - Day 9 on L-Ampho + Flucytosine - Need to re-assess CSF to evaluate treatment   - Creatinine better today 0.99 >> 1.25 >> 1.14 >> 0.99 - Repeat BCx 5/30 >> pending  - Electrolytes being replaced - Magnesium 1.6 - Will need PICC line after clearance of blood. Still hold off for now.   Perirectal abscess = - Antibiotic tx complete - Wound care per surgery  - Flexiseal in place to assist with preventing stool contam.   Lung lesions =  - Likely Crypto induced  - PCP smear negative - AFB negative from 5/28 BAL - mTB probe negative 5/28  - Possible developing mid/upper lobe infiltrates    HIV disease =  - defer ART for 5 weeks after CM treatment  Stroke = 2/2 CM - Prognosis is poor with recent imaging of stroke in CM.  - Non-purposeful movements, not following commands today.   If neurosurgery could please obtain a sample of CSF today/tomorrow for repeat cell count, cryptococcal Ag, gram stain and culture we would  greatly appreciate (orders have been entered).   [name], MSN, NP-C [facility] [place] Cell: [phone] Pager: [phone]  12/30/2016, 1:50 PM

## 2016-12-30 NOTE — Progress Notes (Signed)
Stopped by to visit w/ pt's mom, but no family was in rm. Discussed w/ nurse, will stop by later when mom may be there.   12/30/16 1200  Clinical Encounter Type  Visited With Patient not available  Visit Type Follow-up  Referral From [place] Magdalynn Davilla, Chaplain

## 2016-12-30 NOTE — Progress Notes (Signed)
CRITICAL VALUE ALERT  Critical Value: Positive Crypto antigen titer 1280   WBC in CSF 163  Date & Time Notied: 12/30/16 2225  Provider Notified: [id number]  Orders Received/Actions taken:

## 2016-12-30 NOTE — Progress Notes (Signed)
Received call from [place], NP with ID requesting CSF sample for labs CSF fluid was obtained in three separate vials for labs/culture

## 2016-12-30 NOTE — Progress Notes (Signed)
Pharmacy Consult:  Electrolyte Replacement  72 yom with advanced HIV disease currently on treatment for disseminated cryptococcal disease with bacteremia and meningitis. Pharmacy consulted to manage electrolytes. SCr up 1.25 (fluid boluses pre and post-ambisome added back 5/30).  K WNL at 4.3 on 67meq daily Mag down to 1.6 despite 2gm supplementation yesterday - MD gave 2gm this AM  Plan: Continue KCl 3mEq PT daily Magnesium 2gm IV x 1 in addition to previous dose for a total of 4gm today Monitor K and Mag closely and order additional replacement prn  Salome Arnt, PharmD, BCPS 12/30/2016 7:52 AM

## 2016-12-30 NOTE — Progress Notes (Signed)
Subjective: Sedated and intubated. Continues to demonstrate eye opening to noxious stimuli.   Objective: Current vital signs: BP 125/76   Pulse 66   Temp 98.4 F (36.9 C) (Axillary)   Resp 12   Ht 5' 6" (1.676 m)   Wt 72.3 kg (159 lb 6.3 oz)   SpO2 100%   BMI 25.73 kg/m  Vital signs in last 24 hours: Temp:  [97.9 F (36.6 C)-98.4 F (36.9 C)] 98.4 F (36.9 C) (06/01 0300) Pulse Rate:  [54-88] 66 (06/01 0758) Resp:  [11-18] 12 (06/01 0758) BP: (118-136)/(75-89) 125/76 (06/01 0758) SpO2:  [100 %] 100 % (06/01 0758) FiO2 (%):  [30 %] 30 % (06/01 0758) Weight:  [72.3 kg (159 lb 6.3 oz)] 72.3 kg (159 lb 6.3 oz) (06/01 0500)  Intake/Output from previous day: 05/31 0701 - 06/01 0700 In: 5344.8 [I.V.:1826.5; NG/GT:1610; IV Piggyback:1908.3] Out: 3118 [Urine:2895; Drains:223] Intake/Output this shift: Total I/O In: -  Out: 400 [Urine:400] Nutritional status: Diet NPO time specified  Neurologic Exam: Exam while intubated and sedated on fentanyl.  Ment:Opens eyes to noxious stimuli. Does not attend to visual stimuli. Does not follow commands. No attempts to communicate. No spontaneous purposeful movement. Occasional posturing. HE:RDEYCX 4 mm and minimally reactive. No blink to threat. Corneal reflexes intact. Slow, low amplitude roving EOM. No nystagmus. Face flaccidly symmetric.  Motor/Sensory:At rest, upper extremities with weak tonic posturing bilaterally, fingers flexed and arms internally rotated, with decreased tone. Extensor posturing of left upper extremity to noxious.  Semipurposeful movement of RUE to noxious. Lower extremities flaccid at baseline; withdraw weakly bilaterally to noxious stimuli.  Reflexes:Hypoactive x 4.   Lab Results: Results for orders placed or performed during the hospital encounter of 12/20/16 (from the past 48 hour(s))  Glucose, capillary     Status: Abnormal   Collection Time: 12/28/16 12:05 PM  Result Value Ref Range   Glucose-Capillary 154  (H) 65 - 99 mg/dL  Acid Fast Smear (AFB)     Status: None   Collection Time: 12/28/16  3:16 PM  Result Value Ref Range   AFB Specimen Processing Concentration    Acid Fast Smear Negative     Comment: (NOTE) Performed At: [facility] [place], [place] [id number] Lindon Romp MD [id number]    Source (AFB) TRACHEAL ASPIRATE   Magnesium     Status: None   Collection Time: 12/28/16  4:05 PM  Result Value Ref Range   Magnesium 2.0 1.7 - 2.4 mg/dL  Phosphorus     Status: None   Collection Time: 12/28/16  4:05 PM  Result Value Ref Range   Phosphorus 4.1 2.5 - 4.6 mg/dL  Glucose, capillary     Status: Abnormal   Collection Time: 12/28/16  5:27 PM  Result Value Ref Range   Glucose-Capillary 125 (H) 65 - 99 mg/dL  Glucose, capillary     Status: Abnormal   Collection Time: 12/28/16  8:34 PM  Result Value Ref Range   Glucose-Capillary 117 (H) 65 - 99 mg/dL  Glucose, capillary     Status: Abnormal   Collection Time: 12/29/16 12:03 AM  Result Value Ref Range   Glucose-Capillary 120 (H) 65 - 99 mg/dL  Magnesium     Status: None   Collection Time: 12/29/16  3:04 AM  Result Value Ref Range   Magnesium 1.8 1.7 - 2.4 mg/dL  Basic metabolic panel     Status: Abnormal   Collection Time: 12/29/16  3:04 AM  Result Value Ref Range  Sodium 137 135 - 145 mmol/L   Potassium 4.1 3.5 - 5.1 mmol/L   Chloride 113 (H) 101 - 111 mmol/L   CO2 18 (L) 22 - 32 mmol/L   Glucose, Bld 109 (H) 65 - 99 mg/dL   BUN 18 6 - 20 mg/dL   Creatinine, Ser 1.14 0.61 - 1.24 mg/dL   Calcium 8.1 (L) 8.9 - 10.3 mg/dL   GFR calc non Af Amer >60 >60 mL/min   GFR calc Af Amer >60 >60 mL/min    Comment: (NOTE) The eGFR has been calculated using the CKD EPI equation. This calculation has not been validated in all clinical situations. eGFR's persistently <60 mL/min signify possible Chronic Kidney Disease.    Anion gap 6 5 - 15  CBC     Status: Abnormal   Collection Time: 12/29/16  3:04 AM   Result Value Ref Range   WBC 6.7 4.0 - 10.5 K/uL   RBC 3.13 (L) 4.22 - 5.81 MIL/uL   Hemoglobin 8.2 (L) 13.0 - 17.0 g/dL   HCT 26.5 (L) 39.0 - 52.0 %   MCV 84.7 78.0 - 100.0 fL   MCH 26.2 26.0 - 34.0 pg   MCHC 30.9 30.0 - 36.0 g/dL   RDW 16.2 (H) 11.5 - 15.5 %   Platelets 165 150 - 400 K/uL  Glucose, capillary     Status: Abnormal   Collection Time: 12/29/16  4:16 AM  Result Value Ref Range   Glucose-Capillary 105 (H) 65 - 99 mg/dL  Glucose, capillary     Status: Abnormal   Collection Time: 12/29/16  9:48 AM  Result Value Ref Range   Glucose-Capillary 102 (H) 65 - 99 mg/dL  Glucose, capillary     Status: Abnormal   Collection Time: 12/29/16 11:18 AM  Result Value Ref Range   Glucose-Capillary 112 (H) 65 - 99 mg/dL  C difficile quick scan w PCR reflex     Status: None   Collection Time: 12/29/16 12:06 PM  Result Value Ref Range   C Diff antigen NEGATIVE NEGATIVE   C Diff toxin NEGATIVE NEGATIVE   C Diff interpretation No C. difficile detected.   Glucose, capillary     Status: Abnormal   Collection Time: 12/29/16  4:00 PM  Result Value Ref Range   Glucose-Capillary 100 (H) 65 - 99 mg/dL  Glucose, capillary     Status: None   Collection Time: 12/29/16  7:57 PM  Result Value Ref Range   Glucose-Capillary 93 65 - 99 mg/dL   Comment 1 Notify RN   Glucose, capillary     Status: None   Collection Time: 12/29/16 11:44 PM  Result Value Ref Range   Glucose-Capillary 98 65 - 99 mg/dL   Comment 1 Notify RN    Comment 2 Document in Chart   Magnesium     Status: Abnormal   Collection Time: 12/30/16  3:40 AM  Result Value Ref Range   Magnesium 1.6 (L) 1.7 - 2.4 mg/dL  Basic metabolic panel     Status: Abnormal   Collection Time: 12/30/16  3:40 AM  Result Value Ref Range   Sodium 136 135 - 145 mmol/L   Potassium 4.3 3.5 - 5.1 mmol/L   Chloride 112 (H) 101 - 111 mmol/L   CO2 19 (L) 22 - 32 mmol/L   Glucose, Bld 103 (H) 65 - 99 mg/dL   BUN 17 6 - 20 mg/dL   Creatinine, Ser 0.99  0.61 - 1.24 mg/dL   Calcium  8.4 (L) 8.9 - 10.3 mg/dL   GFR calc non Af Amer >60 >60 mL/min   GFR calc Af Amer >60 >60 mL/min    Comment: (NOTE) The eGFR has been calculated using the CKD EPI equation. This calculation has not been validated in all clinical situations. eGFR's persistently <60 mL/min signify possible Chronic Kidney Disease.    Anion gap 5 5 - 15  Glucose, capillary     Status: Abnormal   Collection Time: 12/30/16  3:49 AM  Result Value Ref Range   Glucose-Capillary 105 (H) 65 - 99 mg/dL   Comment 1 Notify RN    Comment 2 Document in Chart   Glucose, capillary     Status: None   Collection Time: 12/30/16  7:54 AM  Result Value Ref Range   Glucose-Capillary 99 65 - 99 mg/dL    Recent Results (from the past 240 hour(s))  Surgical pcr screen     Status: None   Collection Time: 12/21/16  2:25 AM  Result Value Ref Range Status   MRSA, PCR NEGATIVE NEGATIVE Final   Staphylococcus aureus NEGATIVE NEGATIVE Final    Comment:        The Xpert SA Assay (FDA approved for NASAL specimens in patients over 57 years of age), is one component of a comprehensive surveillance program.  Test performance has been validated by [facility] for patients greater than or equal to 34 year old. It is not intended to diagnose infection nor to guide or monitor treatment.   Blood culture (routine x 2)     Status: Abnormal   Collection Time: 12/21/16  4:11 AM  Result Value Ref Range Status   Specimen Description BLOOD LEFT ANTECUBITAL  Final   Special Requests   Final    BOTTLES DRAWN AEROBIC ONLY Blood Culture adequate volume   Culture  Setup Time   Final    YEAST AEROBIC BOTTLE ONLY CRITICAL VALUE NOTED.  VALUE IS CONSISTENT WITH PREVIOUSLY REPORTED AND CALLED VALUE.    Culture CRYPTOCOCCUS NEOFORMANS (A)  Final   Report Status 12/26/2016 FINAL  Final  Blood culture (routine x 2)     Status: Abnormal   Collection Time: 12/21/16  4:11 AM  Result Value Ref Range Status    Specimen Description BLOOD RIGHT HAND  Final   Special Requests IN PEDIATRIC BOTTLE Blood Culture adequate volume  Final   Culture  Setup Time   Final    IN PEDIATRIC BOTTLE YEAST CRITICAL RESULT CALLED TO, READ BACK BY AND VERIFIED WITH: G.HIBBETT PHARMD, 5038 12/24/16 M.CAMPBELL    Culture CRYPTOCOCCUS NEOFORMANS (A)  Final   Report Status 12/26/2016 FINAL  Final  Blood Culture ID Panel (Reflexed)     Status: None   Collection Time: 12/21/16  4:11 AM  Result Value Ref Range Status   Enterococcus species NOT DETECTED NOT DETECTED Final   Listeria monocytogenes NOT DETECTED NOT DETECTED Final   Staphylococcus species NOT DETECTED NOT DETECTED Final   Staphylococcus aureus NOT DETECTED NOT DETECTED Final   Streptococcus species NOT DETECTED NOT DETECTED Final   Streptococcus agalactiae NOT DETECTED NOT DETECTED Final   Streptococcus pneumoniae NOT DETECTED NOT DETECTED Final   Streptococcus pyogenes NOT DETECTED NOT DETECTED Final   Acinetobacter baumannii NOT DETECTED NOT DETECTED Final   Enterobacteriaceae species NOT DETECTED NOT DETECTED Final   Enterobacter cloacae complex NOT DETECTED NOT DETECTED Final   Escherichia coli NOT DETECTED NOT DETECTED Final   Klebsiella oxytoca NOT DETECTED NOT DETECTED Final  Klebsiella pneumoniae NOT DETECTED NOT DETECTED Final   Proteus species NOT DETECTED NOT DETECTED Final   Serratia marcescens NOT DETECTED NOT DETECTED Final   Haemophilus influenzae NOT DETECTED NOT DETECTED Final   Neisseria meningitidis NOT DETECTED NOT DETECTED Final   Pseudomonas aeruginosa NOT DETECTED NOT DETECTED Final   Candida albicans NOT DETECTED NOT DETECTED Final   Candida glabrata NOT DETECTED NOT DETECTED Final   Candida krusei NOT DETECTED NOT DETECTED Final   Candida parapsilosis NOT DETECTED NOT DETECTED Final   Candida tropicalis NOT DETECTED NOT DETECTED Final  Aerobic/Anaerobic Culture (surgical/deep wound)     Status: Abnormal   Collection Time:  12/21/16  1:39 PM  Result Value Ref Range Status   Specimen Description ABSCESS  Final   Special Requests   Final    PERIANAL PATIENT ON FOLLOWING  SEPTRA DS AND ZITHROMAX   Gram Stain   Final    FEW WBC PRESENT,BOTH PMN AND MONONUCLEAR FEW GRAM VARIABLE ROD    Culture (A)  Final    MULTIPLE ORGANISMS PRESENT, NONE PREDOMINANT MIXED ANAEROBIC FLORA PRESENT.  CALL LAB IF FURTHER IID REQUIRED.    Report Status 12/27/2016 FINAL  Final  CSF culture with Stat gram stain     Status: None   Collection Time: 12/22/16  9:29 AM  Result Value Ref Range Status   Specimen Description BACK  Final   Special Requests Immunocompromised  Final   Gram Stain   Final    CYTOSPIN SMEAR WBC PRESENT, PREDOMINANTLY MONONUCLEAR YEAST CRITICAL RESULT CALLED TO, READ BACK BY AND VERIFIED WITH: RN Morton Peters [phone] MLM    Culture ABUNDANT CRYPTOCOCCUS NEOFORMANS  Final   Report Status 12/25/2016 FINAL  Final  Culture, fungus without smear     Status: Abnormal (Preliminary result)   Collection Time: 12/22/16  9:51 AM  Result Value Ref Range Status   Specimen Description CSF  Final   Special Requests Immunocompromised  Final   Culture CRYPTOCOCCUS NEOFORMANS (A)  Final   Report Status PENDING  Incomplete  CSF culture with Stat gram stain     Status: Abnormal   Collection Time: 12/25/16  5:04 PM  Result Value Ref Range Status   Specimen Description CSF  Final   Special Requests Immunocompromised  Final   Gram Stain   Final    WBC PRESENT,BOTH PMN AND MONONUCLEAR BUDDING YEAST SEEN CYTOSPIN SMEAR CRITICAL RESULT CALLED TO, READ BACK BY AND VERIFIED WITH: L TURNER,RN AT 1821 12/25/16 BY L BENFIELD    Culture CRYPTOCOCCUS NEOFORMANS (A)  Final   Report Status 12/29/2016 FINAL  Final  Culture, fungus without smear     Status: Abnormal (Preliminary result)   Collection Time: 12/25/16  5:05 PM  Result Value Ref Range Status   Specimen Description CSF  Final   Special Requests Immunocompromised  Final    Culture CRYPTOCOCCUS NEOFORMANS (A)  Final   Report Status PENDING  Incomplete  Acid Fast Smear (AFB)     Status: None   Collection Time: 12/26/16  7:06 PM  Result Value Ref Range Status   AFB Specimen Processing Concentration  Final   Acid Fast Smear Negative  Final    Comment: (NOTE) Performed At: [facility] [place], [place] [id number] Lindon Romp MD XN:[id number]    Source (AFB) BRONCHIAL ALVEOLAR LAVAGE  Final  Culture, fungus without smear     Status: None (Preliminary result)   Collection Time: 12/26/16  7:06 PM  Result Value  Ref Range Status   Specimen Description BRONCHIAL ALVEOLAR LAVAGE  Final   Special Requests Immunocompromised  Final   Culture NO FUNGUS ISOLATED AFTER 3 DAYS  Final   Report Status PENDING  Incomplete  Culture, bal-quantitative     Status: None   Collection Time: 12/26/16  7:06 PM  Result Value Ref Range Status   Specimen Description BRONCHIAL ALVEOLAR LAVAGE  Final   Special Requests Immunocompromised  Final   Gram Stain   Final    RARE WBC PRESENT, PREDOMINANTLY PMN NO ORGANISMS SEEN    Culture NO GROWTH 2 DAYS  Final   Report Status 12/29/2016 FINAL  Final  Pneumocystis smear by DFA     Status: None   Collection Time: 12/26/16  7:06 PM  Result Value Ref Range Status   Specimen Source-PJSRC BRONCHIAL ALVEOLAR LAVAGE  Final   Pneumocystis jiroveci Ag NEGATIVE  Final    Comment: Performed at [facility]  Culture, blood (routine x 2)     Status: None (Preliminary result)   Collection Time: 12/28/16  5:12 AM  Result Value Ref Range Status   Specimen Description BLOOD RIGHT HAND  Final   Special Requests   Final    BOTTLES DRAWN AEROBIC ONLY Blood Culture adequate volume   Culture NO GROWTH 1 DAY  Final   Report Status PENDING  Incomplete  Culture, blood (routine x 2)     Status: None (Preliminary result)   Collection Time: 12/28/16  5:12 AM  Result Value Ref Range Status   Specimen Description  BLOOD LEFT HAND  Final   Special Requests IN PEDIATRIC BOTTLE Blood Culture adequate volume  Final   Culture NO GROWTH 1 DAY  Final   Report Status PENDING  Incomplete  Acid Fast Smear (AFB)     Status: None   Collection Time: 12/28/16  3:16 PM  Result Value Ref Range Status   AFB Specimen Processing Concentration  Final   Acid Fast Smear Negative  Final    Comment: (NOTE) Performed At: [facility] [place], [place] [id number] Lindon Romp MD PJ:[id number]    Source (AFB) TRACHEAL ASPIRATE  Final  C difficile quick scan w PCR reflex     Status: None   Collection Time: 12/29/16 12:06 PM  Result Value Ref Range Status   C Diff antigen NEGATIVE NEGATIVE Final   C Diff toxin NEGATIVE NEGATIVE Final   C Diff interpretation No C. difficile detected.  Final    Lipid Panel No results for input(s): CHOL, TRIG, HDL, CHOLHDL, VLDL, LDLCALC in the last 72 hours.  Studies/Results: Dg Chest Port 1 View  Result Date: 12/30/2016 CLINICAL DATA:  Respiratory failure. EXAM: PORTABLE CHEST 1 VIEW COMPARISON:  12/29/2016. FINDINGS: Endotracheal tube and NG tube in stable position. Cardiomegaly. Low lung volumes. Questionable mild left upper and left mid lung infiltrate. No pleural effusion or pneumothorax. IMPRESSION: 1. Lines and tubes stable position. 2. Questionable mild left upper and left mid lung field infiltrates. Electronically Signed   By: Marcello Moores  Register   On: 12/30/2016 07:57   Dg Chest Port 1 View  Result Date: 12/29/2016 CLINICAL DATA:  Respiratory failure. EXAM: PORTABLE CHEST 1 VIEW COMPARISON:  12/28/2016. FINDINGS: Endotracheal tube and NG tube in stable position. Cardiomegaly unchanged. Interim increase in left base consolidation. Small left pleural effusion. No pneumothorax. IMPRESSION: 1.  Lines and tubes in stable position. 2.  Stable cardiomegaly. 3. Interim increase in left base consolidation. Small left  pleural effusion. Electronically Signed   By:  Marcello Moores  Register   On: 12/29/2016 07:44    Medications:   Current Facility-Administered Medications:  .  0.9 %  sodium chloride infusion, , Intravenous, Continuous, Elgergawy, Silver Huguenin, MD, Last Rate: 75 mL/hr at 12/30/16 0600 .  acetaminophen (TYLENOL) solution 650 mg, 650 mg, Per Tube, Q6H PRN, Halford Chessman, Vineet, MD .  amphotericin B liposome (AMBISOME) 240 mg in dextrose 5 % 500 mL IVPB, 3.5 mg/kg, Intravenous, Q24H, Byrum, Rose Fillers, MD, Stopped at 12/29/16 1259 .  azithromycin (ZITHROMAX) 200 MG/5ML suspension 1,200 mg, 1,200 mg, Per Tube, Q Fri, Sood, Elisabeth Cara, MD .  chlorhexidine gluconate (MEDLINE KIT) (PERIDEX) 0.12 % solution 15 mL, 15 mL, Mouth Rinse, BID, Halford Chessman, Vineet, MD, 15 mL at 12/30/16 0837 .  [DISCONTINUED] diphenhydrAMINE (BENADRYL) capsule 25 mg, 25 mg, Oral, Q6H PRN, 25 mg at 12/24/16 0315 **OR** diphenhydrAMINE (BENADRYL) injection 25 mg, 25 mg, Intravenous, Q6H PRN, Rayburn, Kelly A, PA-C, 25 mg at 12/22/16 2229 .  docusate (COLACE) 50 MG/5ML liquid 100 mg, 100 mg, Per Tube, BID PRN, Chesley Mires, MD .  famotidine (PEPCID) IVPB 20 mg premix, 20 mg, Intravenous, Q12H, Chesley Mires, MD, Stopped at 12/29/16 2254 .  feeding supplement (VITAL AF 1.2 CAL) liquid 1,000 mL, 1,000 mL, Per Tube, Continuous, Sood, Vineet, MD, Last Rate: 70 mL/hr at 12/30/16 0642, 1,000 mL at 12/30/16 0642 .  fentaNYL (SUBLIMAZE) bolus via infusion 50 mcg, 50 mcg, Intravenous, Q1H PRN, Chesley Mires, MD, 75 mcg at 12/27/16 0933 .  fentaNYL 2528mg in NS 2530m(1028mml) infusion-PREMIX, 25-400 mcg/hr, Intravenous, Continuous, Sood, Vineet, MD, Last Rate: 5 mL/hr at 12/30/16 0600, 50 mcg/hr at 12/30/16 0600 .  flucytosine (ANCOBON) capsule 1,500 mg, 1,500 mg, Per Tube, Q6H, SooChesley MiresD, 1,500 mg at 12/30/16 0606 .  lidocaine (PF) (XYLOCAINE) 1 % injection 5 mL, 5 mL, Other, Once, Dixon, Stephanie N, NP .  magnesium sulfate IVPB 2 g 50 mL, 2 g, Intravenous, Once, Rumbarger, RacValeda MalmPH, Last Rate: 50 mL/hr  at 12/30/16 0833, 2 g at 12/30/16 0833 .  MEDLINE mouth rinse, 15 mL, Mouth Rinse, 10 times per day, SooChesley MiresD, 15 mL at 12/30/16 0606 .  midazolam (VERSED) injection 2 mg, 2 mg, Intravenous, Q2H PRN, SooChesley MiresD, 2 mg at 12/29/16 1621 .  [DISCONTINUED] ondansetron (ZOFRAN-ODT) disintegrating tablet 4 mg, 4 mg, Oral, Q6H PRN **OR** ondansetron (ZOFRAN) injection 4 mg, 4 mg, Intravenous, Q6H PRN, Rayburn, Kelly A, PA-C, 4 mg at 12/22/16 1939 .  polyethylene glycol (MIRALAX / GLYCOLAX) packet 17 g, 17 g, Oral, Daily PRN, Rayburn, Kelly A, PA-C .  potassium chloride 20 MEQ/15ML (10%) solution 40 mEq, 40 mEq, Per Tube, Daily, BaiRomona CurlsPH, 40 mEq at 12/29/16 [id number] propofol (DIPRIVAN) 1000 MG/100ML infusion, 0-50 mcg/kg/min, Intravenous, Continuous, SooChesley MiresD, Stopped at 12/27/16 1030 .  sodium chloride 0.9 % bolus 500 mL, 500 mL, Intravenous, Q24H, Byrum, RobRose FillersD, Last Rate: 500 mL/hr at 12/30/16 0833, 500 mL at 12/30/16 0833 .  sodium chloride 0.9 % bolus 500 mL, 500 mL, Intravenous, Q24H, Byrum, RobRose FillersD, Stopped at 12/29/16 1420 .  sulfamethoxazole-trimethoprim (BACTRIM,SEPTRA) 200-40 MG/5ML suspension 20 mL, 20 mL, Per Tube, Daily, DixRaleigh CallasP, 20 mL at 12/29/16 0942 .  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 Symptoms did seem to improve after lumbar puncture. CSF positive for cryptococcalmeningitis. 1. CSF culture shows budding yeast and is cryptococcal Ag positive. Blood cultures also positive for cryptococcus. ID is following. On IV amphotericin B and flucytosine per NGT.  2. 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 Worsening of small vessel occlusive infarctions along the superior surface of the brain andunderlying white matter as well. 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  Recommendations: 1. On day 9 of IV amphotericin B and flucytosine per NGT. ID following. Has acute renal failure and dosing may need to be adjusted.  2. Also on ABX for perirectal abscess 3. On Valacyclovir for penile herpes 4. Status postlumbar drain placement by Dr. Kathyrn Sheriff. Clear, slightly straw-colored CSF drainage noted.  5. Continues to improve clinically. Eyes continue to open to noxious stimuli.  6. Neurology will continue to follow    LOS: 9 days   _0  signed: Dr. Kerney Elbe 12/30/2016  8:49 AM

## 2016-12-30 NOTE — Progress Notes (Signed)
[facility] Progress Note Patient Name: William Pena DOB: [birth year] MRN: [id number]   Date of Service  12/30/2016  HPI/Events of Note  Mg++ level = 1.6 and Creatinine = 0.99.  eICU Interventions  Will replace Mg++.     Intervention Category Major Interventions: Electrolyte abnormality - evaluation and management  Sommer,Steven Eugene 12/30/2016, 5:51 AM

## 2016-12-31 LAB — GLUCOSE, CAPILLARY
GLUCOSE-CAPILLARY: 91 mg/dL (ref 65–99)
Glucose-Capillary: 104 mg/dL — ABNORMAL HIGH (ref 65–99)
Glucose-Capillary: 108 mg/dL — ABNORMAL HIGH (ref 65–99)
Glucose-Capillary: 140 mg/dL — ABNORMAL HIGH (ref 65–99)
Glucose-Capillary: 80 mg/dL (ref 65–99)
Glucose-Capillary: 89 mg/dL (ref 65–99)
Glucose-Capillary: 96 mg/dL (ref 65–99)

## 2016-12-31 LAB — BASIC METABOLIC PANEL
Anion gap: 5 (ref 5–15)
BUN: 17 mg/dL (ref 6–20)
CALCIUM: 8.4 mg/dL — AB (ref 8.9–10.3)
CO2: 20 mmol/L — ABNORMAL LOW (ref 22–32)
CREATININE: 0.88 mg/dL (ref 0.61–1.24)
Chloride: 111 mmol/L (ref 101–111)
GFR calc Af Amer: >60 mL/min (ref >60)
Glucose, Bld: 84 mg/dL (ref 65–99)
Potassium: 4.4 mmol/L (ref 3.5–5.1)
SODIUM: 136 mmol/L (ref 135–145)

## 2016-12-31 LAB — MAGNESIUM: MAGNESIUM: 1.9 mg/dL (ref 1.7–2.4)

## 2016-12-31 MED ORDER — MAGNESIUM SULFATE 2 GM/50ML IV SOLN
2.0000 g | Freq: Once | INTRAVENOUS | Status: AC
  Administered 2016-12-31: 2 g via INTRAVENOUS
  Filled 2016-12-31: qty 50

## 2016-12-31 MED ORDER — FENTANYL CITRATE (PF) 100 MCG/2ML IJ SOLN
50.0000 ug | INTRAMUSCULAR | Status: DC | PRN
  Administered 2016-12-31 – 2017-01-02 (×8): 100 ug via INTRAVENOUS
  Filled 2016-12-31 (×8): qty 2

## 2016-12-31 NOTE — Progress Notes (Signed)
Subjective: No acute events overnight.   Objective: Current vital signs: BP (!) 134/91   Pulse 60   Temp 98.9 F (37.2 C) (Axillary)   Resp 15   Ht 5' 6" (1.676 m)   Wt 70.3 kg (154 lb 15.7 oz)   SpO2 100%   BMI 25.01 kg/m  Vital signs in last 24 hours: Temp:  [97.7 F (36.5 C)-98.9 F (37.2 C)] 98.9 F (37.2 C) (06/02 1200) Pulse Rate:  [57-79] 60 (06/02 1200) Resp:  [13-19] 15 (06/02 1200) BP: (118-155)/(68-106) 134/91 (06/02 1200) SpO2:  [99 %-100 %] 100 % (06/02 1200) FiO2 (%):  [30 %] 30 % (06/02 1140) Weight:  [70.3 kg (154 lb 15.7 oz)] 70.3 kg (154 lb 15.7 oz) (06/02 0435)  Intake/Output from previous day: 06/01 0701 - 06/02 0700 In: 5089.8 [I.V.:1909.8; NG/GT:1680; IV Piggyback:1500] Out: 1324 [Urine:4965; Drains:250; Stool:206] Intake/Output this shift: Total I/O In: 150 [I.V.:150] Out: 23 [Drains:23] Nutritional status: Diet NPO time specified  Neurologic Exam: Exam while intubated and sedated on fentanyl. Ment:Opens eyes to noxious stimuli. Does not attend to visual stimuli. Does not follow commands.No attempts to communicate. No spontaneous purposeful movement. Occasional posturing. MW:NUUVOZ 21m and minimally reactive. No blink to threat. Slow, low amplitude roving EOM. No nystagmus. Face flaccidly symmetric.  Motor/Sensory:At rest, upper extremities with weak tonic posturing bilaterally, fingers flexed and arms internally rotated, with decreased tone. Extensor posturing of left upper extremity to noxious.  Semipurposeful movement of RUE to noxious. Lower extremities flaccid at baseline; withdraw weakly bilaterally to noxious stimuli.  Reflexes:Hypoactive x 4.   Lab Results: Results for orders placed or performed during the hospital encounter of 12/20/16 (from the past 48 hour(s))  Glucose, capillary     Status: Abnormal   Collection Time: 12/29/16  4:00 PM  Result Value Ref Range   Glucose-Capillary 100 (H) 65 - 99 mg/dL  Glucose, capillary      Status: None   Collection Time: 12/29/16  7:57 PM  Result Value Ref Range   Glucose-Capillary 93 65 - 99 mg/dL   Comment 1 Notify RN   Glucose, capillary     Status: None   Collection Time: 12/29/16 11:44 PM  Result Value Ref Range   Glucose-Capillary 98 65 - 99 mg/dL   Comment 1 Notify RN    Comment 2 Document in Chart   Magnesium     Status: Abnormal   Collection Time: 12/30/16  3:40 AM  Result Value Ref Range   Magnesium 1.6 (L) 1.7 - 2.4 mg/dL  Basic metabolic panel     Status: Abnormal   Collection Time: 12/30/16  3:40 AM  Result Value Ref Range   Sodium 136 135 - 145 mmol/L   Potassium 4.3 3.5 - 5.1 mmol/L   Chloride 112 (H) 101 - 111 mmol/L   CO2 19 (L) 22 - 32 mmol/L   Glucose, Bld 103 (H) 65 - 99 mg/dL   BUN 17 6 - 20 mg/dL   Creatinine, Ser 0.99 0.61 - 1.24 mg/dL   Calcium 8.4 (L) 8.9 - 10.3 mg/dL   GFR calc non Af Amer >60 >60 mL/min   GFR calc Af Amer >60 >60 mL/min    Comment: (NOTE) The eGFR has been calculated using the CKD EPI equation. This calculation has not been validated in all clinical situations. eGFR's persistently <60 mL/min signify possible Chronic Kidney Disease.    Anion gap 5 5 - 15  Glucose, capillary     Status: Abnormal   Collection  Time: 12/30/16  3:49 AM  Result Value Ref Range   Glucose-Capillary 105 (H) 65 - 99 mg/dL   Comment 1 Notify RN    Comment 2 Document in Chart   Glucose, capillary     Status: None   Collection Time: 12/30/16  7:54 AM  Result Value Ref Range   Glucose-Capillary 99 65 - 99 mg/dL  Glucose, capillary     Status: Abnormal   Collection Time: 12/30/16 11:52 AM  Result Value Ref Range   Glucose-Capillary 115 (H) 65 - 99 mg/dL  CSF culture with Stat gram stain     Status: None (Preliminary result)   Collection Time: 12/30/16  2:14 PM  Result Value Ref Range   Specimen Description CSF    Special Requests Immunocompromised    Gram Stain      WBC PRESENT, PREDOMINANTLY MONONUCLEAR ENCAPSULATED YEAST  SEEN CYTOSPIN SMEAR    Culture NO GROWTH < 24 HOURS    Report Status PENDING   Cryptococcal antigen, CSF     Status: Abnormal   Collection Time: 12/30/16  2:14 PM  Result Value Ref Range   Crypto Ag POSITIVE (A) NEGATIVE    Comment: CRITICAL RESULT CALLED TO, READ BACK BY AND VERIFIED WITH: J CARMICHAEL RN 2226 12/30/16 A BROWNING    Cryptococcal Ag Titer 1,280 (A) NOT INDICATED  Glucose, capillary     Status: None   Collection Time: 12/30/16  3:58 PM  Result Value Ref Range   Glucose-Capillary 85 65 - 99 mg/dL  CSF cell count with differential     Status: Abnormal   Collection Time: 12/30/16  6:45 PM  Result Value Ref Range   Tube # 1    Color, CSF PINK (A) COLORLESS   Appearance, CSF HAZY (A) CLEAR   Supernatant XANTHOCHROMIC    RBC Count, CSF 2,900 (H) 0 /cu mm   WBC, CSF 163 (HH) 0 - 5 /cu mm    Comment: CRITICAL RESULT CALLED TO, READ BACK BY AND VERIFIED WITH: A. ALLISON RN [phone] GREEN R    Segmented Neutrophils-CSF 2 0 - 6 %   Lymphs, CSF 92 (H) 40 - 80 %   Monocyte-Macrophage-Spinal Fluid 6 (L) 15 - 45 %   Other Cells, CSF YEAST SEEN ON SLIDE    Glucose, capillary     Status: None   Collection Time: 12/30/16  8:29 PM  Result Value Ref Range   Glucose-Capillary 87 65 - 99 mg/dL  Glucose, capillary     Status: None   Collection Time: 12/31/16 12:13 AM  Result Value Ref Range   Glucose-Capillary 91 65 - 99 mg/dL  Magnesium     Status: None   Collection Time: 12/31/16  3:26 AM  Result Value Ref Range   Magnesium 1.9 1.7 - 2.4 mg/dL  Basic metabolic panel     Status: Abnormal   Collection Time: 12/31/16  3:26 AM  Result Value Ref Range   Sodium 136 135 - 145 mmol/L   Potassium 4.4 3.5 - 5.1 mmol/L   Chloride 111 101 - 111 mmol/L   CO2 20 (L) 22 - 32 mmol/L   Glucose, Bld 84 65 - 99 mg/dL   BUN 17 6 - 20 mg/dL   Creatinine, Ser 0.88 0.61 - 1.24 mg/dL   Calcium 8.4 (L) 8.9 - 10.3 mg/dL   GFR calc non Af Amer >60 >60 mL/min   GFR calc Af Amer >60 >60  mL/min    Comment: (NOTE) The eGFR has  been calculated using the CKD EPI equation. This calculation has not been validated in all clinical situations. eGFR's persistently <60 mL/min signify possible Chronic Kidney Disease.    Anion gap 5 5 - 15  Glucose, capillary     Status: None   Collection Time: 12/31/16  3:53 AM  Result Value Ref Range   Glucose-Capillary 80 65 - 99 mg/dL  Glucose, capillary     Status: Abnormal   Collection Time: 12/31/16  8:05 AM  Result Value Ref Range   Glucose-Capillary 104 (H) 65 - 99 mg/dL  Glucose, capillary     Status: None   Collection Time: 12/31/16 11:59 AM  Result Value Ref Range   Glucose-Capillary 89 65 - 99 mg/dL    Recent Results (from the past 240 hour(s))  Aerobic/Anaerobic Culture (surgical/deep wound)     Status: Abnormal   Collection Time: 12/21/16  1:39 PM  Result Value Ref Range Status   Specimen Description ABSCESS  Final   Special Requests   Final    PERIANAL PATIENT ON FOLLOWING  SEPTRA DS AND ZITHROMAX   Gram Stain   Final    FEW WBC PRESENT,BOTH PMN AND MONONUCLEAR FEW GRAM VARIABLE ROD    Culture (A)  Final    MULTIPLE ORGANISMS PRESENT, NONE PREDOMINANT MIXED ANAEROBIC FLORA PRESENT.  CALL LAB IF FURTHER IID REQUIRED.    Report Status 12/27/2016 FINAL  Final  CSF culture with Stat gram stain     Status: None   Collection Time: 12/22/16  9:29 AM  Result Value Ref Range Status   Specimen Description BACK  Final   Special Requests Immunocompromised  Final   Gram Stain   Final    CYTOSPIN SMEAR WBC PRESENT, PREDOMINANTLY MONONUCLEAR YEAST CRITICAL RESULT CALLED TO, READ BACK BY AND VERIFIED WITH: RN Morton Peters [phone]    Culture ABUNDANT CRYPTOCOCCUS NEOFORMANS  Final   Report Status 12/25/2016 FINAL  Final  Culture, fungus without smear     Status: Abnormal (Preliminary result)   Collection Time: 12/22/16  9:51 AM  Result Value Ref Range Status   Specimen Description CSF  Final   Special Requests  Immunocompromised  Final   Culture CRYPTOCOCCUS NEOFORMANS (A)  Final   Report Status PENDING  Incomplete  CSF culture with Stat gram stain     Status: Abnormal   Collection Time: 12/25/16  5:04 PM  Result Value Ref Range Status   Specimen Description CSF  Final   Special Requests Immunocompromised  Final   Gram Stain   Final    WBC PRESENT,BOTH PMN AND MONONUCLEAR BUDDING YEAST SEEN CYTOSPIN SMEAR CRITICAL RESULT CALLED TO, READ BACK BY AND VERIFIED WITH: L TURNER,RN AT 1821 12/25/16 BY L BENFIELD    Culture CRYPTOCOCCUS NEOFORMANS (A)  Final   Report Status 12/29/2016 FINAL  Final  Culture, fungus without smear     Status: Abnormal (Preliminary result)   Collection Time: 12/25/16  5:05 PM  Result Value Ref Range Status   Specimen Description CSF  Final   Special Requests Immunocompromised  Final   Culture CRYPTOCOCCUS NEOFORMANS (A)  Final   Report Status PENDING  Incomplete  Acid Fast Smear (AFB)     Status: None   Collection Time: 12/26/16  7:06 PM  Result Value Ref Range Status   AFB Specimen Processing Concentration  Final   Acid Fast Smear Negative  Final    Comment: (NOTE) Performed At: [facility] [address] [place], [place] [id number] Darrel Hoover  F MD Ph:[phone]    Source (AFB) BRONCHIAL ALVEOLAR LAVAGE  Final  Culture, fungus without smear     Status: None (Preliminary result)   Collection Time: 12/26/16  7:06 PM  Result Value Ref Range Status   Specimen Description BRONCHIAL ALVEOLAR LAVAGE  Final   Special Requests Immunocompromised  Final   Culture CULTURE REINCUBATED FOR BETTER GROWTH  Final   Report Status PENDING  Incomplete  Culture, bal-quantitative     Status: None   Collection Time: 12/26/16  7:06 PM  Result Value Ref Range Status   Specimen Description BRONCHIAL ALVEOLAR LAVAGE  Final   Special Requests Immunocompromised  Final   Gram Stain   Final    RARE WBC PRESENT, PREDOMINANTLY PMN NO ORGANISMS SEEN    Culture NO GROWTH 2  DAYS  Final   Report Status 12/29/2016 FINAL  Final  Pneumocystis smear by DFA     Status: None   Collection Time: 12/26/16  7:06 PM  Result Value Ref Range Status   Specimen Source-[facility] BRONCHIAL ALVEOLAR LAVAGE  Final   Pneumocystis jiroveci Ag NEGATIVE  Final    Comment: Performed at [facility]  Culture, blood (routine x 2)     Status: None (Preliminary result)   Collection Time: 12/28/16  5:12 AM  Result Value Ref Range Status   Specimen Description BLOOD RIGHT HAND  Final   Special Requests   Final    BOTTLES DRAWN AEROBIC ONLY Blood Culture adequate volume   Culture NO GROWTH 2 DAYS  Final   Report Status PENDING  Incomplete  Culture, blood (routine x 2)     Status: None (Preliminary result)   Collection Time: 12/28/16  5:12 AM  Result Value Ref Range Status   Specimen Description BLOOD LEFT HAND  Final   Special Requests IN PEDIATRIC BOTTLE Blood Culture adequate volume  Final   Culture NO GROWTH 2 DAYS  Final   Report Status PENDING  Incomplete  Acid Fast Smear (AFB)     Status: None   Collection Time: 12/28/16  3:16 PM  Result Value Ref Range Status   AFB Specimen Processing Concentration  Final   Acid Fast Smear Negative  Final    Comment: (NOTE) Performed At: [facility] [address] [place], [place] [id number] Hancock William F MD Ph:[phone]    Source (AFB) TRACHEAL ASPIRATE  Final  Stool culture (children & immunocomp patients)     Status: None (Preliminary result)   Collection Time: 12/29/16 12:06 PM  Result Value Ref Range Status   Salmonella/Shigella Screen PENDING  Incomplete   Campylobacter Culture PENDING  Incomplete   E coli, Shiga toxin Assay Negative Negative Final    Comment: (NOTE) Performed At: [facility] [address] [place], [place] [id number] Hancock William F MD Ph:[phone]   Gastrointestinal Panel by PCR , Stool     Status: None   Collection Time: 12/29/16 12:06 PM  Result Value Ref Range Status    Campylobacter species NOT DETECTED NOT DETECTED Final   Plesimonas shigelloides NOT DETECTED NOT DETECTED Final   Salmonella species NOT DETECTED NOT DETECTED Final   Yersinia enterocolitica NOT DETECTED NOT DETECTED Final   Vibrio species NOT DETECTED NOT DETECTED Final   Vibrio cholerae NOT DETECTED NOT DETECTED Final   Enteroaggregative E coli (EAEC) NOT DETECTED NOT DETECTED Final   Enteropathogenic E coli (EPEC) NOT DETECTED NOT DETECTED Final   Enterotoxigenic E coli (ETEC) NOT DETECTED NOT DETECTED Final     Shiga like toxin producing E coli (STEC) NOT DETECTED NOT DETECTED Final   Shigella/Enteroinvasive E coli (EIEC) NOT DETECTED NOT DETECTED Final   Cryptosporidium NOT DETECTED NOT DETECTED Final   Cyclospora cayetanensis NOT DETECTED NOT DETECTED Final   Entamoeba histolytica NOT DETECTED NOT DETECTED Final   Giardia lamblia NOT DETECTED NOT DETECTED Final   Adenovirus F40/41 NOT DETECTED NOT DETECTED Final   Astrovirus NOT DETECTED NOT DETECTED Final   Norovirus GI/GII NOT DETECTED NOT DETECTED Final   Rotavirus A NOT DETECTED NOT DETECTED Final   Sapovirus (I, II, IV, and V) NOT DETECTED NOT DETECTED Final  C difficile quick scan w PCR reflex     Status: None   Collection Time: 12/29/16 12:06 PM  Result Value Ref Range Status   C Diff antigen NEGATIVE NEGATIVE Final   C Diff toxin NEGATIVE NEGATIVE Final   C Diff interpretation No C. difficile detected.  Final  CSF culture with Stat gram stain     Status: None (Preliminary result)   Collection Time: 12/30/16  2:14 PM  Result Value Ref Range Status   Specimen Description CSF  Final   Special Requests Immunocompromised  Final   Gram Stain   Final    WBC PRESENT, PREDOMINANTLY MONONUCLEAR ENCAPSULATED YEAST SEEN CYTOSPIN SMEAR    Culture NO GROWTH < 24 HOURS  Final   Report Status PENDING  Incomplete    Lipid Panel No results for input(s): CHOL, TRIG, HDL, CHOLHDL, VLDL, LDLCALC in the last 72  hours.  Studies/Results: Dg Chest Port 1 View  Result Date: 12/30/2016 CLINICAL DATA:  Respiratory failure. EXAM: PORTABLE CHEST 1 VIEW COMPARISON:  12/29/2016. FINDINGS: Endotracheal tube and NG tube in stable position. Cardiomegaly. Low lung volumes. Questionable mild left upper and left mid lung infiltrate. No pleural effusion or pneumothorax. IMPRESSION: 1. Lines and tubes stable position. 2. Questionable mild left upper and left mid lung field infiltrates. Electronically Signed   By: Thomas  Register   On: 12/30/2016 07:57    Medications:   Current Facility-Administered Medications:  .  0.9 %  sodium chloride infusion, , Intravenous, Continuous, Elgergawy, Dawood S, MD, Last Rate: 75 mL/hr at 12/31/16 0800 .  amphotericin B liposome (AMBISOME) 270 mg in dextrose 5 % 500 mL IVPB, 4 mg/kg, Intravenous, Q24H, Stone, Taylor P, RPH, Stopped at 12/31/16 1608 .  azithromycin (ZITHROMAX) 200 MG/5ML suspension 1,200 mg, 1,200 mg, Per Tube, Q Fri, Sood, Vineet, MD, 1,200 mg at 12/30/16 0955 .  chlorhexidine gluconate (MEDLINE KIT) (PERIDEX) 0.12 % solution 15 mL, 15 mL, Mouth Rinse, BID, Sood, Vineet, MD, 15 mL at 12/31/16 0815 .  [DISCONTINUED] diphenhydrAMINE (BENADRYL) capsule 25 mg, 25 mg, Oral, Q6H PRN, 25 mg at 12/24/16 0315 **OR** diphenhydrAMINE (BENADRYL) injection 25 mg, 25 mg, Intravenous, Q6H PRN, Rayburn, Kelly A, PA-C, 25 mg at 12/22/16 2229 .  docusate (COLACE) 50 MG/5ML liquid 100 mg, 100 mg, Per Tube, BID PRN, Sood, Vineet, MD .  famotidine (PEPCID) IVPB 20 mg premix, 20 mg, Intravenous, Q12H, Sood, Vineet, MD, Stopped at 12/31/16 1159 .  feeding supplement (VITAL AF 1.2 CAL) liquid 1,000 mL, 1,000 mL, Per Tube, Continuous, Sood, Vineet, MD, Last Rate: 70 mL/hr at 12/31/16 0600, 1,000 mL at 12/31/16 0600 .  fentaNYL (SUBLIMAZE) injection 50-200 mcg, 50-200 mcg, Intravenous, Q2H PRN, Ramaswamy, Murali, MD .  flucytosine (ANCOBON) capsule 1,500 mg, 1,500 mg, Per Tube, Q6H, Sood, Vineet,  MD, 1,500 mg at 12/31/16 1409 .  lidocaine (PF) (XYLOCAINE) 1 % injection   5 mL, 5 mL, Other, Once, Dixon, [employer], NP .  MEDLINE mouth rinse, 15 mL, Mouth Rinse, 10 times per day, Chesley Mires, MD, 15 mL at 12/31/16 1406 .  midazolam (VERSED) injection 2 mg, 2 mg, Intravenous, Q2H PRN, Chesley Mires, MD, 2 mg at 12/31/16 0600 .  [DISCONTINUED] ondansetron (ZOFRAN-ODT) disintegrating tablet 4 mg, 4 mg, Oral, Q6H PRN **OR** ondansetron (ZOFRAN) injection 4 mg, 4 mg, Intravenous, Q6H PRN, Rayburn, Kelly A, PA-C, 4 mg at 12/22/16 1939 .  polyethylene glycol (MIRALAX / GLYCOLAX) packet 17 g, 17 g, Oral, Daily PRN, Rayburn, Kelly A, PA-C .  potassium chloride 20 MEQ/15ML (10%) solution 40 mEq, 40 mEq, Per Tube, Daily, Romona Curls, RPH, 40 mEq at 12/31/16 0943 .  sodium chloride 0.9 % bolus 500 mL, 500 mL, Intravenous, Q24H, Byrum, Rose Fillers, MD, Stopped at 12/31/16 1342 .  sodium chloride 0.9 % bolus 500 mL, 500 mL, Intravenous, Q24H, Byrum, Rose Fillers, MD, Stopped at 12/30/16 1345 .  sulfamethoxazole-trimethoprim (BACTRIM,SEPTRA) 200-40 MG/5ML suspension 10 mL, 10 mL, Per Tube, Daily, Dixon, Stephanie N, NP, 10 mL at 12/31/16 0944 .  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 Symptoms did seem to improve after lumbar puncture. CSF positive for cryptococcalmeningitis. 1. CSF culture shows budding yeast and is cryptococcal Ag positive. Blood cultures also positive for cryptococcus. ID is following. On IV amphotericin B and flucytosine per NGT. CSF resampled 6/1 with positive cryptococcal antigen  titer of 1280, decreased from prior CSF sample obtained 5/27. WBC in CSF from 6/1 is 163 per microliter, increased from 5/27, most likely reflecting increased immune response  in the setting of antifungal treatment.  2. 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 Worsening of small vessel occlusive infarctions along the superior surface of the brain andunderlying white matter as well. 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  Recommendations: 1. On day 10 ofIV amphotericin B and flucytosine per NGT. ID following.  2. Also on Valacyclovir for penile herpes 3. Status postlumbar drain placement by Dr. Kathyrn Sheriff. Clear, slightly straw-colored CSF drainage noted.  4. Clinically stable since yesterday. Eyes continue to open to noxious stimuli.  5. Neurology will continue to follow   LOS: 10 days   _0  signed: Dr. Kerney Elbe 12/31/2016  1:29 PM

## 2016-12-31 NOTE — Progress Notes (Signed)
RT placed Pt on wean at 0816 5/5 and 40%. Pt started breath stacking with RR in the 80"s. RT terminated the wean at 0847. RT will continue to monitor

## 2016-12-31 NOTE — Progress Notes (Signed)
Lumbar drain remains functional Neuro unchanged, mother at bedside

## 2016-12-31 NOTE — Progress Notes (Signed)
Name: William Pena MRN: [id number] DOB: 12/13/1985    ADMISSION DATE:  12/20/2016 CONSULTATION DATE:  5/24 REFERRING MD :  Baxter Flattery   CHIEF COMPLAINT:  Disseminated cryptococcus  BRIEF This is a 31 year old man w/ HIV/AIDS (last CD4 1 w/ VL 81K). Just started back on ART 2 months ago however he was incarcerated and reported he was not given his medications. He was admitted on 5/22 for perirectal abscess. During admission he was also noted to have HA, fever, chills, night sweats neck pain and LAN. ID was consulted and recommended imaging of chest, LP, and cultures be sent from perirectal I&D. Since that time CT chest results: "Bilateral airspace disease with a small cavitary lesion in the right upper lobe. Findings compatible with pneumonia.Trace right pleural fluid versus right pleural thickening". Also his serum cryptococcal antigenemia titer was elevated raising the possibility of disseminated cryptococcal infection. Including lung involvement. We have been asked to see by infectious disease for bronchoscopy and washings in effort to help determine if CT changes reflect mTB OR possibly pulmonary cryptococcal disease. Initially unable to perform initial bronchoscopy due to hypoxia. 5/28 called by primary for altered LOC. He became very lethargic/obtunded requiring CT and LP, but airway was an issue so PCCM asked to see for ICU transfer and intubation.   SIGNIFICANT EVENTS   STUDIES:  CT head 5/24: nml CT chest 5/24: Bilateral airspace disease with a small cavitary lesion in the right upper lobe. Findings compatible with pneumonia. Trace right pleural fluid versus right pleural thickening. CT abd/pelvis 5/23: Edema tracking along the right side of the gluteal cleft is similar in appearance to the prior study. This reflected a subcutaneous abscess on the prior study. This most likely reflects a persistent small subcutaneous abscess. 2. Diffuse soft tissue edema tracking about the anorectal  canal, without additional abscess. Diffuse presacral soft tissue inflammation noted. 3. Scrotal wall edema, worse on the right, with small bilateral Hydroceles. 4. Mild right basilar airspace opacity raises concern for pneumonia. 5. Mild wall thickening along the distal sigmoid colon and rectum, raising concern for proctitis. 6. Enlarged azygoesophageal recess, retroperitoneal, pelvic sidewall and bilateral inguinal nodes seen, measuring up to 1.7 cm in short axis. This likely reflects the underlying infection. 7. Cholelithiasis.  Gallbladder otherwise unremarkable.  Culture data 5/23 BCX2>>> CRYPTOCOCCUS NEOFORMANS  AFB 5/28>> negative Csf 5/27>>>yeast Surgical wound culture 5/23: few wbc, few gm variable rods>>> Acid fast culture w/ reflexed sensitivities from sputum 5/23>>> afb from sputum 5/23>>> BAL 5/28 > negative BAL fungal 5/28 >> negative so far >>  BAL PCP 5/28 >> negative C diff 5/31 >> negative  ABX azith weekly 5/22>>> Ceftriaxone 5/23>>> Flagyl 5/23>>> Amphotericin B 5/23>>> Flucytosine 5/23>>> Valcyclovir 5/24 > Bactrim 5/24 >  Cefepime 5/22>>>5/23 vanc 5/23 only  EVENT 6/1 - Tolerating pressure support this morning Localizing on the right per RN report   SUBJECTIVE/OVERNIGHT/INTERVAL HX 6/2 -  overalll unresponsive off sedation gtt.  On PRVC  VITAL SIGNS: Temp:  [97.7 F (36.5 C)-98.1 F (36.7 C)] 98 F (36.7 C) (06/02 0800) Pulse Rate:  [57-91] 67 (06/02 0800) Resp:  [13-18] 14 (06/02 0800) BP: (118-155)/(68-106) 132/86 (06/02 1139) SpO2:  [99 %-100 %] 100 % (06/02 0800) FiO2 (%):  [30 %] 30 % (06/02 1140) Weight:  [70.3 kg (154 lb 15.7 oz)] 70.3 kg (154 lb 15.7 oz) (06/02 0435)  PHYSICAL EXAMINATION:   General Appearance:    Looks criticall ill   Head:    Normocephalic, without obvious  abnormality, atraumatic  Eyes:    PERRL - yes, conjunctiva/corneas - clear      Ears:    Normal external ear canals, both ears  Nose:   NG tube - no  Throat:   ETT TUBE - yes , OG tube - yes  Neck:   Supple,  No enlargement/tenderness/nodules     Lungs:     Clear to auscultation bilaterally, Ventilator   Synchrony - yes  Chest wall:    No deformity  Heart:    S1 and S2 normal, no murmur, CVP - no.  Pressors - no  Abdomen:     Soft, no masses, no organomegaly  Genitalia:    Not done  Rectal:   not done  Extremities:   Extremities- no cyanosis. No clubbing     Skin:   Intact in exposed areas .TATTOO +     Neurologic:   Sedation - none -> RASS - -4      PULMONARY  Recent Labs Lab 12/26/16 2028  PHART 7.460*  PCO2ART 31.8*  PO2ART 348.0*  HCO3 22.6  TCO2 24  O2SAT 100.0    CBC  Recent Labs Lab 12/27/16 0524 12/28/16 0512 12/29/16 0304  HGB 9.7* 8.8* 8.2*  HCT 29.3* 27.7* 26.5*  WBC 9.7 10.1 6.7  PLT 146* 155 165    COAGULATION No results for input(s): INR in the last 168 hours.  CARDIAC  No results for input(s): TROPONINI in the last 168 hours. No results for input(s): PROBNP in the last 168 hours.   CHEMISTRY  Recent Labs Lab 12/25/16 0345  12/27/16 0524 12/27/16 1810 12/28/16 0512 12/28/16 1605 12/29/16 0304 12/30/16 0340 12/31/16 0326  NA 136  < > 137  --  134*  --  137 136 136  K 4.1  < > 3.7  --  4.3  --  4.1 4.3 4.4  CL 112*  < > 110  --  110  --  113* 112* 111  CO2 18*  < > 19*  --  19*  --  18* 19* 20*  GLUCOSE 84  < > 108*  --  146*  --  109* 103* 84  BUN 6  < > <5*  --  11  --  _0 CREATININE 0.82  < > 0.99  --  1.25*  --  1.14 0.99 0.88  CALCIUM 7.9*  < > 8.4*  --  8.0*  --  8.1* 8.4* 8.4*  MG 1.7  < > 1.5* 3.1* 2.3 2.0 1.8 1.6* 1.9  PHOS 2.2*  --   --  5.1* 4.4 4.1  --   --   --   < > = values in this interval not displayed. Estimated Creatinine Clearance: 109.8 mL/min (by C-G formula based on SCr of 0.88 mg/dL).   LIVER  Recent Labs Lab 12/27/16 0524 12/28/16 0512  AST 36 30  ALT 57 42  ALKPHOS 144* 135*  BILITOT 0.7 0.4  PROT 8.5* 8.3*  ALBUMIN 2.3* 2.2*      INFECTIOUS  Recent Labs Lab 12/25/16 0345  PROCALCITON 4.24     ENDOCRINE CBG (last 3)   Recent Labs  12/30/16 2029 12/31/16 0013 12/31/16 0353  GLUCAP 87 91 80     IMAGING x48h  - image(s) personally visualized  -   highlighted in bold Dg Chest Port 1 View  Result Date: 12/30/2016 CLINICAL DATA:  Respiratory failure. EXAM: PORTABLE CHEST 1 VIEW COMPARISON:  12/29/2016. FINDINGS: Endotracheal tube and NG  tube in stable position. Cardiomegaly. Low lung volumes. Questionable mild left upper and left mid lung infiltrate. No pleural effusion or pneumothorax. IMPRESSION: 1. Lines and tubes stable position. 2. Questionable mild left upper and left mid lung field infiltrates. Electronically Signed   By: Marcello Moores  Register   On: 12/30/2016 07:57    DISCUSSION: 31 year old male with history HIV/AIDS admitted [facility]/22 from jail for fevers with known perirectal abscess. 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 5/28 worsening mental status requiring intubation for LP and CT. PCCM to assume care. Pressure on LP elevated and drained. 5/29 lumbar drain placement. Progressive changes on MRI brain 5/29, ? Ischemic infarct BG due to inflammation / cryptococcal infection.   Principal Problem:   Perirectal abscess Active Problems:   Chronic pain   AIDS (acquired immune deficiency syndrome) (HCC)   Hyponatremia   Normocytic anemia   Elevated LFTs   Pulmonary infiltrates   Hypomagnesemia   Encephalopathy acute   SIRS (systemic inflammatory response syndrome) (HCC)   Acute respiratory failure (HCC)   Rectal abscess   Cryptococcal meningoencephalitis (HCC)   Cryptococcosis (HCC)   Cavitary pneumonia   Diffuse lymphadenopathy   Drug rash   Elevated intracranial pressure   HIV disease (HCC)   Lymphadenopathy of head and neck    ASSESSMENT / PLAN:  PULMONARY A: Acute hypoxemic respiratory failure Bilateral pulmonary infiltrates - disseminated cryptococcus RUL  infiltrate, ? Small area cavitation > PNA  12/31/2016 - failed SBT. 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) Hypokalemia  12/31/2016 - nil acute other than  Mild hypomag  P:   Replete mag  Follow BMP, urine output Amphotericin dosing per pharmacy recommendations, per renal function  GASTROINTESTINAL A:   Diarrhea  P:   Rectal tube in place Stool studies pending for enteric pathogens PPI prophylaxis Nothing by mouth  HEMATOLOGIC A:   Anemia, chronic (Hgb at baseline)  P:  Follow CBC Heparin prophylaxis SCD  INFECTIOUS A:   Disseminated cryptococcus, including meningitis, possibly pneumonia Bilateral pulmonary infiltrates, pneumonia with micronodular cavitation No current evidence tuberculosis based on culture data but at risk No current evidence PCP, but at risk Perirectal abscess, drained HIV/AIDS (CD4 count 21) Genital herpes Diarrhea, ? infectious  P:   Appreciate ID management Day 10 L-ampho/flycytosine, note acute renal failure, may need to be adjusted Day 10 acyclovir Day 12 of proph bactrim/weekly azithro Airborne precautions have been lifted with negative AFB Stool culture panel pending. C. 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 Appreciate assistance Appreciate neurology evaluation, particularly with regard to prognosis here. Continuing to treat underlying cause,  cryptococcus   FAMILY  - Updates: Status reviewed with the patient's mother at bedside on 5/31. No family at bedside 12/31/2016   - Inter-disciplinary family meet or Palliative Care meeting due by:  6/3    The patient is critically ill with multiple organ systems failure and requires high complexity decision making for assessment and support, frequent  evaluation and titration of therapies, application of advanced monitoring technologies and extensive interpretation of multiple databases.   Critical Care Time devoted to patient care services described in this note is  30  Minutes. This time reflects time of care of this signee Dr Brand Males. This critical care time does not reflect procedure time, or teaching time or supervisory time of PA/NP/Med student/Med Resident etc but could involve care discussion time    Dr. Brand Males, M.D., [facility].C.P Pulmonary and Critical Care Medicine Staff Physician [facility] Pager: [phone], If no answer or between  15:00h - 7:00h: call [phone]  12/31/2016 12:20 PM

## 2016-12-31 NOTE — Progress Notes (Signed)
100 ml of Fentanyl wasted in sink and witness by Arby Barrette) Social research officer, government, RN

## 2016-12-31 NOTE — Progress Notes (Signed)
[facility] for Infectious Disease    Date of Admission:  12/20/2016   Total days of antibiotics 10        Day 10 L-ampho/flucytosine           ID: William Pena is a 31 y.o. male with advanced hiv disease, CD 4 count of 21/VL1.50M with disseminated  Cryptococcal disease including meningitis, bacteremia, and likely pneumonia  Principal Problem:   Perirectal abscess Active Problems:   Chronic pain   AIDS (acquired immune deficiency syndrome) (HCC)   Hyponatremia   Normocytic anemia   Elevated LFTs   Pulmonary infiltrates   Hypomagnesemia   Encephalopathy acute   SIRS (systemic inflammatory response syndrome) (HCC)   Acute respiratory failure (HCC)   Rectal abscess   Cryptococcal meningoencephalitis (HCC)   Cryptococcosis (HCC)   Cavitary pneumonia   Diffuse lymphadenopathy   Drug rash   Elevated intracranial pressure   HIV disease (HCC)   Lymphadenopathy of head and neck    Subjective: Afebrile, remains sedated, intubated. Had repeat CSF analysis- pulled for LD apparatus showing still fungal elements on gram stain. WBC 163 with 92% lymphocytic predominance, also 2,900 RBC   Medications:  . azithromycin  1,200 mg Per Tube Q Fri  . chlorhexidine gluconate (MEDLINE KIT)  15 mL Mouth Rinse BID  . flucytosine  1,500 mg Per Tube Q6H  . lidocaine (PF)  5 mL Other Once  . mouth rinse  15 mL Mouth Rinse 10 times per day  . potassium chloride  40 mEq Per Tube Daily  . sulfamethoxazole-trimethoprim  10 mL Per Tube Daily  . valACYclovir  1,000 mg Per Tube BID    Objective: Vital signs in last 24 hours: Temp:  [97.7 F (36.5 C)-98.3 F (36.8 C)] 98 F (36.7 C) (06/02 0800) Pulse Rate:  [57-91] 67 (06/02 0800) Resp:  [12-18] 14 (06/02 0800) BP: (118-155)/(68-110) 132/84 (06/02 0816) SpO2:  [99 %-100 %] 100 % (06/02 0800) FiO2 (%):  [30 %] 30 % (06/02 0847) Weight:  [154 lb 15.7 oz (70.3 kg)] 154 lb 15.7 oz (70.3 kg) (06/02 0435) Physical Exam  Constitutional:  sedated/intubated. He appears well-developed and well-nourished. No distress.  HENT:  Mouth/Throat: OETT in place  Cardiovascular: Normal rate, regular rhythm and normal heart sounds. Exam reveals no gallop and no friction rub.  No murmur heard.  Pulmonary/Chest: Effort normal and breath sounds normal. No respiratory distress. He has no wheezes.  Abdominal: Soft. Bowel sounds are decreased. He exhibits no distension. There is no tenderness.  Neurological: moves facial muscles/opens eyes to noxious stimuli Skin: Skin is warm and dry. No rash noted. No erythema.   Lab Results  Recent Labs  12/29/16 0304 12/30/16 0340 12/31/16 0326  WBC 6.7  --   --   HGB 8.2*  --   --   HCT 26.5*  --   --   NA 137 136 136  K 4.1 4.3 4.4  CL 113* 112* 111  CO2 18* 19* 20*  BUN _0 CREATININE 1.14 0.99 0.88    Microbiology: 6/1 csf cx pending 5/30 blood cx pending 5/30 aspirate trach- afb smear negative, pending  Studies/Results: Dg Chest Port 1 View  Result Date: 12/30/2016 CLINICAL DATA:  Respiratory failure. EXAM: PORTABLE CHEST 1 VIEW COMPARISON:  12/29/2016. FINDINGS: Endotracheal tube and NG tube in stable position. Cardiomegaly. Low lung volumes. Questionable mild left upper and left mid lung infiltrate. No pleural effusion or pneumothorax. IMPRESSION: 1. Lines and tubes  stable position. 2. Questionable mild left upper and left mid lung field infiltrates. Electronically Signed   By: Marcello Moores  Register   On: 12/30/2016 07:57     Assessment/Plan: CM c/b small vessel infarcts= continue on current regimen of L-ampho and flucytosine, recommend to check CBC tomorrow to ensure no significant BM suppression. Appears to be tolerating ampho cr is at baseline  Repeat LP showing lymphocytic predominanc and yeast on gram stain but we will watch if culture results from 6/1 is still positive. Ideally hoping his CSF is becoming sterile  Neuro status is relatively unchanged  oi proph = continue  with bactrim ss daily  Perirectal abscess= received a short course of abtx after debridement, continue with wound care  Pneumonia = thought to be due to cryptococcal infection. mtB PCR and AFB negative  Lymphadenopathy = deferred biopsy at this time. If more fevers, would consider to biopsy for mac  hsv genital lesions = current on valtrex  hiv disease =tx deferred due to CM treatment    [place], [facility] for Infect[facility] Diseases Cell: [phone] Pager: [phone]  12/31/2016, 9:54 AM

## 2017-01-01 ENCOUNTER — Inpatient Hospital Stay (HOSPITAL_COMMUNITY): Payer: Medicaid Other

## 2017-01-01 LAB — CBC WITH DIFFERENTIAL/PLATELET
BASOS ABS: 0 10*3/uL (ref 0.0–0.1)
Basophils Relative: 0 %
Eosinophils Absolute: 0.1 10*3/uL (ref 0.0–0.7)
Eosinophils Relative: 1 %
HEMATOCRIT: 25.6 % — AB (ref 39.0–52.0)
Hemoglobin: 8 g/dL — ABNORMAL LOW (ref 13.0–17.0)
LYMPHS ABS: 0.7 10*3/uL (ref 0.7–4.0)
LYMPHS PCT: 11 %
MCH: 26.6 pg (ref 26.0–34.0)
MCHC: 31.3 g/dL (ref 30.0–36.0)
MCV: 85 fL (ref 78.0–100.0)
MONO ABS: 0.6 10*3/uL (ref 0.1–1.0)
Monocytes Relative: 9 %
NEUTROS ABS: 5.3 10*3/uL (ref 1.7–7.7)
Neutrophils Relative %: 79 %
Platelets: 239 10*3/uL (ref 150–400)
RBC: 3.01 MIL/uL — AB (ref 4.22–5.81)
RDW: 17.1 % — AB (ref 11.5–15.5)
WBC: 6.7 10*3/uL (ref 4.0–10.5)

## 2017-01-01 LAB — BASIC METABOLIC PANEL
ANION GAP: 8 (ref 5–15)
BUN: 16 mg/dL (ref 6–20)
CHLORIDE: 108 mmol/L (ref 101–111)
CO2: 19 mmol/L — ABNORMAL LOW (ref 22–32)
Calcium: 8.6 mg/dL — ABNORMAL LOW (ref 8.9–10.3)
Creatinine, Ser: 0.84 mg/dL (ref 0.61–1.24)
GFR calc Af Amer: >60 mL/min (ref >60)
GFR calc non Af Amer: >60 mL/min (ref >60)
GLUCOSE: 94 mg/dL (ref 65–99)
POTASSIUM: 4.1 mmol/L (ref 3.5–5.1)
Sodium: 135 mmol/L (ref 135–145)

## 2017-01-01 LAB — GLUCOSE, CAPILLARY
GLUCOSE-CAPILLARY: 93 mg/dL (ref 65–99)
GLUCOSE-CAPILLARY: 123 mg/dL — AB (ref 65–99)
Glucose-Capillary: 109 mg/dL — ABNORMAL HIGH (ref 65–99)
Glucose-Capillary: 93 mg/dL (ref 65–99)
Glucose-Capillary: 100 mg/dL — ABNORMAL HIGH (ref 65–99)
Glucose-Capillary: 100 mg/dL — ABNORMAL HIGH (ref 65–99)

## 2017-01-01 LAB — MAGNESIUM: MAGNESIUM: 1.6 mg/dL — AB (ref 1.7–2.4)

## 2017-01-01 LAB — PHOSPHORUS: Phosphorus: 5.7 mg/dL — ABNORMAL HIGH (ref 2.5–4.6)

## 2017-01-01 MED ORDER — MAGNESIUM SULFATE 4 GM/100ML IV SOLN
4.0000 g | Freq: Once | INTRAVENOUS | Status: AC
Start: 2017-01-01 — End: 2017-01-01
  Administered 2017-01-01: 4 g via INTRAVENOUS
  Filled 2017-01-01: qty 100

## 2017-01-01 NOTE — Progress Notes (Addendum)
Lumbar drain remains functional No change

## 2017-01-01 NOTE — Progress Notes (Signed)
[facility] Progress Note Patient Name: William Pena DOB: Dec 13, 1985 MRN: [id number]   Date of Service  01/01/2017  HPI/Events of Note  Hypomag  eICU Interventions  Mag replaced     Intervention Category Intermediate Interventions: Electrolyte abnormality - evaluation and management  Orvetta Danielski 01/01/2017, 5:10 AM

## 2017-01-01 NOTE — Progress Notes (Signed)
Pt is tolerating weaning at 5/5. Pt responds to pain. RT will continue to monitor

## 2017-01-01 NOTE — Progress Notes (Signed)
Subjective: Patient is tolerating weaning at 5/5. No acute events overnight. Now making occasional eye contact with nursing and medical staff when aroused to an awake state with noxious stimuli.   Objective: Current vital signs: BP 126/84   Pulse 89   Temp 98.3 F (36.8 C) (Axillary)   Resp 15   Ht _0  (1.676 m)   Wt 65.6 kg (144 lb 10 oz)   SpO2 99%   BMI 23.34 kg/m  Vital signs in last 24 hours: Temp:  [97.9 F (36.6 C)-98.9 F (37.2 C)] 98.3 F (36.8 C) (06/03 0820) Pulse Rate:  [60-121] 89 (06/03 0800) Resp:  [12-22] 15 (06/03 0800) BP: (111-161)/(68-120) 126/84 (06/03 0849) SpO2:  [99 %-100 %] 99 % (06/03 0800) FiO2 (%):  [30 %] 30 % (06/03 0849) Weight:  [65.6 kg (144 lb 10 oz)] 65.6 kg (144 lb 10 oz) (06/03 0500)  Intake/Output from previous day: 06/02 0701 - 06/03 0700 In: 5371.7 [I.V.:1830; NG/GT:1750; IV Piggyback:1791.7] Out: 4317 [Urine:4000; Drains:317] Intake/Output this shift: Total I/O In: 175 [I.V.:75; IV Piggyback:100] Out: 469 [Urine:450; Drains:19] Nutritional status: Diet NPO time specified  Neurologic Exam: Intubated and intermittently sedated with fentanyl.  Ment:Opens eyes to noxious stimuli. Does not attend to visual stimuli, except will direct saccades towards examiner when painful stimulus is administered. Does not follow commands.No attempts to communicate. No spontaneous purposeful movement. Occasional flexor posturing spontaneously. Extensor posturing to noxious at times. TI:WPYKDX 5 >> 40m and sluggishly reactive. Occasional saccades towards and will fixate upon examiner after noxious stimulus. Face flaccidly symmetric.  Motor/Sensory:At rest, upper extremities with weak tonic posturing bilaterally, fingers flexed and arms internally rotated, with increased tone. Extensor posturing of bilateral upper extremities to noxious. Semipurposeful movement of RUE to noxious. Lower extremities flaccid at baseline; withdraw weakly bilaterally to  noxious stimuli.  Reflexes:Hypoactive x 4.   Lab Results: Results for orders placed or performed during the hospital encounter of 12/20/16 (from the past 48 hour(s))  Glucose, capillary     Status: Abnormal   Collection Time: 12/30/16 11:52 AM  Result Value Ref Range   Glucose-Capillary 115 (H) 65 - 99 mg/dL  CSF culture with Stat gram stain     Status: None (Preliminary result)   Collection Time: 12/30/16  2:14 PM  Result Value Ref Range   Specimen Description CSF    Special Requests Immunocompromised    Gram Stain      WBC PRESENT, PREDOMINANTLY MONONUCLEAR ENCAPSULATED YEAST SEEN CYTOSPIN SMEAR    Culture NO GROWTH 2 DAYS    Report Status PENDING   Cryptococcal antigen, CSF     Status: Abnormal   Collection Time: 12/30/16  2:14 PM  Result Value Ref Range   Crypto Ag POSITIVE (A) NEGATIVE    Comment: CRITICAL RESULT CALLED TO, READ BACK BY AND VERIFIED WITH: J CARMICHAEL RN 2226 12/30/16 A BROWNING    Cryptococcal Ag Titer 1,280 (A) NOT INDICATED  Glucose, capillary     Status: None   Collection Time: 12/30/16  3:58 PM  Result Value Ref Range   Glucose-Capillary 85 65 - 99 mg/dL  CSF cell count with differential     Status: Abnormal   Collection Time: 12/30/16  6:45 PM  Result Value Ref Range   Tube # 1    Color, CSF PINK (A) COLORLESS   Appearance, CSF HAZY (A) CLEAR   Supernatant XANTHOCHROMIC    RBC Count, CSF 2,900 (H) 0 /cu mm   WBC, CSF 163 (HH) 0 - 5 /  cu mm    Comment: CRITICAL RESULT CALLED TO, READ BACK BY AND VERIFIED WITH: A. ALLISON RN [phone] GREEN R    Segmented Neutrophils-CSF 2 0 - 6 %   Lymphs, CSF 92 (H) 40 - 80 %   Monocyte-Macrophage-Spinal Fluid 6 (L) 15 - 45 %   Other Cells, CSF YEAST SEEN ON SLIDE    Glucose, capillary     Status: None   Collection Time: 12/30/16  8:29 PM  Result Value Ref Range   Glucose-Capillary 87 65 - 99 mg/dL  Glucose, capillary     Status: None   Collection Time: 12/31/16 12:13 AM  Result Value Ref Range    Glucose-Capillary 91 65 - 99 mg/dL  Magnesium     Status: None   Collection Time: 12/31/16  3:26 AM  Result Value Ref Range   Magnesium 1.9 1.7 - 2.4 mg/dL  Basic metabolic panel     Status: Abnormal   Collection Time: 12/31/16  3:26 AM  Result Value Ref Range   Sodium 136 135 - 145 mmol/L   Potassium 4.4 3.5 - 5.1 mmol/L   Chloride 111 101 - 111 mmol/L   CO2 20 (L) 22 - 32 mmol/L   Glucose, Bld 84 65 - 99 mg/dL   BUN 17 6 - 20 mg/dL   Creatinine, Ser 0.88 0.61 - 1.24 mg/dL   Calcium 8.4 (L) 8.9 - 10.3 mg/dL   GFR calc non Af Amer >60 >60 mL/min   GFR calc Af Amer >60 >60 mL/min    Comment: (NOTE) The eGFR has been calculated using the CKD EPI equation. This calculation has not been validated in all clinical situations. eGFR's persistently <60 mL/min signify possible Chronic Kidney Disease.    Anion gap 5 5 - 15  Glucose, capillary     Status: None   Collection Time: 12/31/16  3:53 AM  Result Value Ref Range   Glucose-Capillary 80 65 - 99 mg/dL  Glucose, capillary     Status: Abnormal   Collection Time: 12/31/16  8:05 AM  Result Value Ref Range   Glucose-Capillary 104 (H) 65 - 99 mg/dL  Glucose, capillary     Status: None   Collection Time: 12/31/16 11:59 AM  Result Value Ref Range   Glucose-Capillary 89 65 - 99 mg/dL  Glucose, capillary     Status: Abnormal   Collection Time: 12/31/16  3:53 PM  Result Value Ref Range   Glucose-Capillary 140 (H) 65 - 99 mg/dL  Glucose, capillary     Status: None   Collection Time: 12/31/16  8:10 PM  Result Value Ref Range   Glucose-Capillary 96 65 - 99 mg/dL  Glucose, capillary     Status: Abnormal   Collection Time: 12/31/16 11:54 PM  Result Value Ref Range   Glucose-Capillary 108 (H) 65 - 99 mg/dL  Magnesium     Status: Abnormal   Collection Time: 01/01/17  2:49 AM  Result Value Ref Range   Magnesium 1.6 (L) 1.7 - 2.4 mg/dL  Basic metabolic panel     Status: Abnormal   Collection Time: 01/01/17  2:49 AM  Result Value Ref Range    Sodium 135 135 - 145 mmol/L   Potassium 4.1 3.5 - 5.1 mmol/L   Chloride 108 101 - 111 mmol/L   CO2 19 (L) 22 - 32 mmol/L   Glucose, Bld 94 65 - 99 mg/dL   BUN 16 6 - 20 mg/dL   Creatinine, Ser 0.84 0.61 - 1.24 mg/dL  Calcium 8.6 (L) 8.9 - 10.3 mg/dL   GFR calc non Af Amer >60 >60 mL/min   GFR calc Af Amer >60 >60 mL/min    Comment: (NOTE) The eGFR has been calculated using the CKD EPI equation. This calculation has not been validated in all clinical situations. eGFR's persistently <60 mL/min signify possible Chronic Kidney Disease.    Anion gap 8 5 - 15  CBC with Differential/Platelet     Status: Abnormal   Collection Time: 01/01/17  2:49 AM  Result Value Ref Range   WBC 6.7 4.0 - 10.5 K/uL   RBC 3.01 (L) 4.22 - 5.81 MIL/uL   Hemoglobin 8.0 (L) 13.0 - 17.0 g/dL   HCT 25.6 (L) 39.0 - 52.0 %   MCV 85.0 78.0 - 100.0 fL   MCH 26.6 26.0 - 34.0 pg   MCHC 31.3 30.0 - 36.0 g/dL   RDW 17.1 (H) 11.5 - 15.5 %   Platelets 239 150 - 400 K/uL   Neutrophils Relative % 79 %   Neutro Abs 5.3 1.7 - 7.7 K/uL   Lymphocytes Relative 11 %   Lymphs Abs 0.7 0.7 - 4.0 K/uL   Monocytes Relative 9 %   Monocytes Absolute 0.6 0.1 - 1.0 K/uL   Eosinophils Relative 1 %   Eosinophils Absolute 0.1 0.0 - 0.7 K/uL   Basophils Relative 0 %   Basophils Absolute 0.0 0.0 - 0.1 K/uL  Phosphorus     Status: Abnormal   Collection Time: 01/01/17  2:49 AM  Result Value Ref Range   Phosphorus 5.7 (H) 2.5 - 4.6 mg/dL  Glucose, capillary     Status: Abnormal   Collection Time: 01/01/17  3:26 AM  Result Value Ref Range   Glucose-Capillary 109 (H) 65 - 99 mg/dL  Glucose, capillary     Status: None   Collection Time: 01/01/17  8:17 AM  Result Value Ref Range   Glucose-Capillary 93 65 - 99 mg/dL    Recent Results (from the past 240 hour(s))  CSF culture with Stat gram stain     Status: Abnormal   Collection Time: 12/25/16  5:04 PM  Result Value Ref Range Status   Specimen Description CSF  Final   Special  Requests Immunocompromised  Final   Gram Stain   Final    WBC PRESENT,BOTH PMN AND MONONUCLEAR BUDDING YEAST SEEN CYTOSPIN SMEAR CRITICAL RESULT CALLED TO, READ BACK BY AND VERIFIED WITH: L TURNER,RN AT 1821 12/25/16 BY L BENFIELD    Culture CRYPTOCOCCUS NEOFORMANS (A)  Final   Report Status 12/29/2016 FINAL  Final  Culture, fungus without smear     Status: Abnormal (Preliminary result)   Collection Time: 12/25/16  5:05 PM  Result Value Ref Range Status   Specimen Description CSF  Final   Special Requests Immunocompromised  Final   Culture CRYPTOCOCCUS NEOFORMANS (A)  Final   Report Status PENDING  Incomplete  Acid Fast Smear (AFB)     Status: None   Collection Time: 12/26/16  7:06 PM  Result Value Ref Range Status   AFB Specimen Processing Concentration  Final   Acid Fast Smear Negative  Final    Comment: (NOTE) Performed At: [facility] [address] [place], [place] [id number] Lindon Romp MD WU:[id number]    Source (AFB) BRONCHIAL ALVEOLAR LAVAGE  Final  Culture, fungus without smear     Status: None (Preliminary result)   Collection Time: 12/26/16  7:06 PM  Result Value Ref Range Status   Specimen  Description BRONCHIAL ALVEOLAR LAVAGE  Final   Special Requests Immunocompromised  Final   Culture CULTURE REINCUBATED FOR BETTER GROWTH  Final   Report Status PENDING  Incomplete  Culture, bal-quantitative     Status: None   Collection Time: 12/26/16  7:06 PM  Result Value Ref Range Status   Specimen Description BRONCHIAL ALVEOLAR LAVAGE  Final   Special Requests Immunocompromised  Final   Gram Stain   Final    RARE WBC PRESENT, PREDOMINANTLY PMN NO ORGANISMS SEEN    Culture NO GROWTH 2 DAYS  Final   Report Status 12/29/2016 FINAL  Final  Pneumocystis smear by DFA     Status: None   Collection Time: 12/26/16  7:06 PM  Result Value Ref Range Status   Specimen Source-PJSRC BRONCHIAL ALVEOLAR LAVAGE  Final   Pneumocystis jiroveci Ag NEGATIVE  Final     Comment: Performed at [facility]  Culture, blood (routine x 2)     Status: None (Preliminary result)   Collection Time: 12/28/16  5:12 AM  Result Value Ref Range Status   Specimen Description BLOOD RIGHT HAND  Final   Special Requests   Final    BOTTLES DRAWN AEROBIC ONLY Blood Culture adequate volume   Culture NO GROWTH 3 DAYS  Final   Report Status PENDING  Incomplete  Culture, blood (routine x 2)     Status: None (Preliminary result)   Collection Time: 12/28/16  5:12 AM  Result Value Ref Range Status   Specimen Description BLOOD LEFT HAND  Final   Special Requests IN PEDIATRIC BOTTLE Blood Culture adequate volume  Final   Culture NO GROWTH 3 DAYS  Final   Report Status PENDING  Incomplete  Acid Fast Smear (AFB)     Status: None   Collection Time: 12/28/16  3:16 PM  Result Value Ref Range Status   AFB Specimen Processing Concentration  Final   Acid Fast Smear Negative  Final    Comment: (NOTE) Performed At: [facility] [place], [place] [id number] Lindon Romp MD GY:[id number]    Source (AFB) TRACHEAL ASPIRATE  Final  Stool culture (children & immunocomp patients)     Status: None (Preliminary result)   Collection Time: 12/29/16 12:06 PM  Result Value Ref Range Status   Salmonella/Shigella Screen Final report  Final    Comment: (NOTE) Performed At: [facility] [place], [place] [id number] Lindon Romp MD [id number]    Campylobacter Culture PENDING  Incomplete   E coli, Shiga toxin Assay Negative Negative Final    Comment: (NOTE) Performed At: [facility] [place], [place] [id number] Lindon Romp MD [id number]   Gastrointestinal Panel by PCR , Stool     Status: None   Collection Time: 12/29/16 12:06 PM  Result Value Ref Range Status   Campylobacter species NOT DETECTED NOT DETECTED Final   Plesimonas shigelloides NOT DETECTED NOT DETECTED Final   Salmonella species  NOT DETECTED NOT DETECTED Final   Yersinia enterocolitica NOT DETECTED NOT DETECTED Final   Vibrio species NOT DETECTED NOT DETECTED Final   Vibrio cholerae NOT DETECTED NOT DETECTED Final   Enteroaggregative E coli (EAEC) NOT DETECTED NOT DETECTED Final   Enteropathogenic E coli (EPEC) NOT DETECTED NOT DETECTED Final   Enterotoxigenic E coli (ETEC) NOT DETECTED NOT DETECTED Final   Shiga like toxin producing E coli (STEC) NOT DETECTED NOT DETECTED Final   Shigella/Enteroinvasive E coli (EIEC) NOT  DETECTED NOT DETECTED Final   Cryptosporidium NOT DETECTED NOT DETECTED Final   Cyclospora cayetanensis NOT DETECTED NOT DETECTED Final   Entamoeba histolytica NOT DETECTED NOT DETECTED Final   Giardia lamblia NOT DETECTED NOT DETECTED Final   Adenovirus F40/41 NOT DETECTED NOT DETECTED Final   Astrovirus NOT DETECTED NOT DETECTED Final   Norovirus GI/GII NOT DETECTED NOT DETECTED Final   Rotavirus A NOT DETECTED NOT DETECTED Final   Sapovirus (I, II, IV, and V) NOT DETECTED NOT DETECTED Final  C difficile quick scan w PCR reflex     Status: None   Collection Time: 12/29/16 12:06 PM  Result Value Ref Range Status   C Diff antigen NEGATIVE NEGATIVE Final   C Diff toxin NEGATIVE NEGATIVE Final   C Diff interpretation No C. difficile detected.  Final  STOOL CULTURE REFLEX - RSASHR     Status: None   Collection Time: 12/29/16 12:06 PM  Result Value Ref Range Status   Stool Culture result 1 (RSASHR) Comment  Final    Comment: (NOTE) No Salmonella or Shigella recovered. Performed At: [facility] [address] [place], [place] [id number] Lindon Romp MD IO:[id number]   CSF culture with Stat gram stain     Status: None (Preliminary result)   Collection Time: 12/30/16  2:14 PM  Result Value Ref Range Status   Specimen Description CSF  Final   Special Requests Immunocompromised  Final   Gram Stain   Final    WBC PRESENT, PREDOMINANTLY MONONUCLEAR ENCAPSULATED YEAST  SEEN CYTOSPIN SMEAR    Culture NO GROWTH 2 DAYS  Final   Report Status PENDING  Incomplete    Lipid Panel No results for input(s): CHOL, TRIG, HDL, CHOLHDL, VLDL, LDLCALC in the last 72 hours.  Studies/Results: Dg Chest Port 1 View  Result Date: 01/01/2017 CLINICAL DATA:  Endotracheal tube placement EXAM: PORTABLE CHEST 1 VIEW COMPARISON:  12/30/2016 and prior studies FINDINGS: Cardiomegaly again noted. An endotracheal tube with tip 2.9 cm above the carina and NG tube entering stomach again noted. Decreased left lung opacities and improved aeration since the prior study noted. There is no evidence of pneumothorax or pleural effusion. IMPRESSION: Improving left lung opacities with improved aeration. No other significant change. Electronically Signed   By: Margarette Canada M.D.   On: 01/01/2017 07:08    Medications:   Current Facility-Administered Medications:  .  0.9 %  sodium chloride infusion, , Intravenous, Continuous, Elgergawy, Silver Huguenin, MD, Last Rate: 75 mL/hr at 01/01/17 0800 .  amphotericin B liposome (AMBISOME) 270 mg in dextrose 5 % 500 mL IVPB, 4 mg/kg, Intravenous, Q24H, Ricka Burdock, RPH, Stopped at 12/31/16 1608 .  azithromycin (ZITHROMAX) 200 MG/5ML suspension 1,200 mg, 1,200 mg, Per Tube, Q Jiles Garter, Elisabeth Cara, MD, 1,200 mg at 12/30/16 0955 .  chlorhexidine gluconate (MEDLINE KIT) (PERIDEX) 0.12 % solution 15 mL, 15 mL, Mouth Rinse, BID, Halford Chessman, Vineet, MD, 15 mL at 01/01/17 0759 .  [DISCONTINUED] diphenhydrAMINE (BENADRYL) capsule 25 mg, 25 mg, Oral, Q6H PRN, 25 mg at 12/24/16 0315 **OR** diphenhydrAMINE (BENADRYL) injection 25 mg, 25 mg, Intravenous, Q6H PRN, Rayburn, Kelly A, PA-C, 25 mg at 12/22/16 2229 .  docusate (COLACE) 50 MG/5ML liquid 100 mg, 100 mg, Per Tube, BID PRN, Chesley Mires, MD .  famotidine (PEPCID) IVPB 20 mg premix, 20 mg, Intravenous, Q12H, Sood, Vineet, MD, Last Rate: 100 mL/hr at 01/01/17 0952, 20 mg at 01/01/17 0952 .  feeding supplement (VITAL AF 1.2 CAL)  liquid 1,000  mL, 1,000 mL, Per Tube, Continuous, Chesley Mires, MD, Last Rate: 70 mL/hr at 01/01/17 0556, 1,000 mL at 01/01/17 0556 .  fentaNYL (SUBLIMAZE) injection 50-200 mcg, 50-200 mcg, Intravenous, Q2H PRN, Brand Males, MD, 100 mcg at 01/01/17 0756 .  flucytosine (ANCOBON) capsule 1,500 mg, 1,500 mg, Per Tube, Q6H, Chesley Mires, MD, 1,500 mg at 01/01/17 0556 .  lidocaine (PF) (XYLOCAINE) 1 % injection 5 mL, 5 mL, Other, Once, Dixon, [employer], NP .  MEDLINE mouth rinse, 15 mL, Mouth Rinse, 10 times per day, Chesley Mires, MD, 15 mL at 01/01/17 1004 .  midazolam (VERSED) injection 2 mg, 2 mg, Intravenous, Q2H PRN, Chesley Mires, MD, 2 mg at 12/31/16 2028 .  [DISCONTINUED] ondansetron (ZOFRAN-ODT) disintegrating tablet 4 mg, 4 mg, Oral, Q6H PRN **OR** ondansetron (ZOFRAN) injection 4 mg, 4 mg, Intravenous, Q6H PRN, Rayburn, Kelly A, PA-C, 4 mg at 12/22/16 1939 .  polyethylene glycol (MIRALAX / GLYCOLAX) packet 17 g, 17 g, Oral, Daily PRN, Rayburn, Kelly A, PA-C .  potassium chloride 20 MEQ/15ML (10%) solution 40 mEq, 40 mEq, Per Tube, Daily, Romona Curls, RPH, 40 mEq at 01/01/17 7564 .  sodium chloride 0.9 % bolus 500 mL, 500 mL, Intravenous, Q24H, Byrum, Rose Fillers, MD, Stopped at 12/31/16 1342 .  sodium chloride 0.9 % bolus 500 mL, 500 mL, Intravenous, Q24H, Byrum, Rose Fillers, MD, Stopped at 12/31/16 1735 .  sulfamethoxazole-trimethoprim (BACTRIM,SEPTRA) 200-40 MG/5ML suspension 10 mL, 10 mL, Per Tube, Daily, Dixon, Stephanie N, NP, 10 mL at 01/01/17 0952 .  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 1. CSF culture shows budding yeast and is cryptococcal Ag positive. Blood cultures also positive for cryptococcus. ID is following. On IV amphotericin B and flucytosine per NGT. CSF resampled 6/1 with positive cryptococcal antigen  titer of  1280, decreased from prior CSF sample obtained 5/27. WBC in CSF from 6/1 is 163 per microliter, increased from 5/27, most likely reflecting increased immune response in the setting of antifungal treatment.  2. 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 Worsening of small vessel occlusive infarctions along the superior surface of the brain andunderlying white matter as well. 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  Recommendations: 1. On day 11ofIV amphotericin B and flucytosine per NGT. ID following.  2. Also on Valacyclovir for penile herpes 3. Status postlumbar drain placement by Dr. Kathyrn Sheriff. Clear, slightly straw-colored CSF drainage noted.  4. Clinically improved slightly since yesterday. Eyes continue to open to noxious stimuli.  5. Neurology will continue to follow.   LOS: 11 days   _0  signed: Dr. Kerney Elbe 01/01/2017  10:15 AM

## 2017-01-01 NOTE — Progress Notes (Signed)
Pt wasn't going to extubated and there was sveral visitors and RT placed Pt back on full support.

## 2017-01-01 NOTE — Progress Notes (Signed)
[facility] for Infectious Disease    Date of Admission:  12/20/2016   Total days of antibiotics 12        Day 12 L-ampho/flucytosine           ID: William Pena is a 31 y.o. male with advanced hiv disease, CD 4 count of 21/VL1.52M with disseminated  Cryptococcal disease including meningitis, bacteremia, and likely pneumonia  Principal Problem:   Perirectal abscess Active Problems:   Chronic pain   AIDS (acquired immune deficiency syndrome) (HCC)   Hyponatremia   Normocytic anemia   Elevated LFTs   Pulmonary infiltrates   Hypomagnesemia   Encephalopathy acute   SIRS (systemic inflammatory response syndrome) (HCC)   Acute respiratory failure (HCC)   Rectal abscess   Cryptococcal meningoencephalitis (HCC)   Cryptococcosis (HCC)   Cavitary pneumonia   Diffuse lymphadenopathy   Drug rash   Elevated intracranial pressure   HIV disease (HCC)   Lymphadenopathy of head and neck    Subjective: Afebrile, remains sedated, intubated. Reported to open eyes and making eye contact with staff per report  Medications:  . azithromycin  1,200 mg Per Tube Q Fri  . chlorhexidine gluconate (MEDLINE KIT)  15 mL Mouth Rinse BID  . flucytosine  1,500 mg Per Tube Q6H  . lidocaine (PF)  5 mL Other Once  . mouth rinse  15 mL Mouth Rinse 10 times per day  . potassium chloride  40 mEq Per Tube Daily  . sulfamethoxazole-trimethoprim  10 mL Per Tube Daily  . valACYclovir  1,000 mg Per Tube BID    Objective: Vital signs in last 24 hours: Temp:  [97.9 F (36.6 C)-98.6 F (37 C)] 98.6 F (37 C) (06/03 1130) Pulse Rate:  [62-121] 111 (06/03 1200) Resp:  [12-22] 18 (06/03 1200) BP: (111-161)/(68-120) 126/78 (06/03 1200) SpO2:  [98 %-100 %] 100 % (06/03 1200) FiO2 (%):  [30 %] 30 % (06/03 1134) Weight:  [144 lb 10 oz (65.6 kg)] 144 lb 10 oz (65.6 kg) (06/03 0500) Physical Exam  Constitutional: sedated/intubated. He appears well-developed and well-nourished. No distress.  HENT:    Mouth/Throat: OETT in place  Cardiovascular: Normal rate, regular rhythm and normal heart sounds. Exam reveals no gallop and no friction rub.  No murmur heard.  Pulmonary/Chest: Effort normal and breath sounds normal. No respiratory distress. He has no wheezes.  Abdominal: Soft. Bowel sounds are decreased. He exhibits no distension. There is no tenderness.  Neurological: moves facial muscles/opens eyes to noxious stimuli Skin: Skin is warm and dry. No rash noted. No erythema.   Lab Results  Recent Labs  12/31/16 0326 01/01/17 0249  WBC  --  6.7  HGB  --  8.0*  HCT  --  25.6*  NA 136 135  K 4.4 4.1  CL 111 108  CO2 20* 19*  BUN 17 16  CREATININE 0.88 0.84    Microbiology: 6/1 csf cx pending 5/30 blood cx pending 5/30 aspirate trach- afb smear negative, pending CSF analysis- pulled for LD apparatus showing still fungal elements on gram stain. WBC 163 with 92% lymphocytic predominance, also 2,900 RBC Studies/Results: Dg Chest Port 1 View  Result Date: 01/01/2017 CLINICAL DATA:  Endotracheal tube placement EXAM: PORTABLE CHEST 1 VIEW COMPARISON:  12/30/2016 and prior studies FINDINGS: Cardiomegaly again noted. An endotracheal tube with tip 2.9 cm above the carina and NG tube entering stomach again noted. Decreased left lung opacities and improved aeration since the prior study noted. There is no  evidence of pneumothorax or pleural effusion. IMPRESSION: Improving left lung opacities with improved aeration. No other significant change. Electronically Signed   By: Margarette Canada M.D.   On: 01/01/2017 07:08     Assessment/Plan: CM c/b small vessel infarcts= continue on current regimen of L-ampho and flucytosine, recommend to check CBC tomorrow to ensure no significant BM suppression. Appears to be tolerating ampho cr is at baseline  Plan to continue beyond #14 days due to burden of disease and also want to prove that we have CSF culture negative.   Repeat LP showing lymphocytic  predominanc and yeast on gram stain but we will watch if culture results from 6/1 is still positive. Ideally hoping his CSF is becoming sterile  Neuro status slightly improved with increased eye opening  oi proph = continue with bactrim ss daily  Perirectal abscess= received a short course of abtx after debridement, continue with wound care  Pneumonia = thought to be due to cryptococcal infection. mtB PCR and AFB negative  Lymphadenopathy = deferred biopsy at this time. If more fevers, would consider to biopsy for mac  hsv genital lesions = current on valtrex, plan to finish 14 day course .currently day 12  hiv disease =tx deferred due to CM treatment    [place], [facility] for Infect[facility] Diseases Cell: [phone] Pager: [phone]  01/01/2017, 1:22 PM

## 2017-01-01 NOTE — Progress Notes (Signed)
Name: William Pena MRN: [id number] DOB: 05/19/1986    ADMISSION DATE:  12/20/2016 CONSULTATION DATE:  5/24 REFERRING MD :  Baxter Flattery   CHIEF COMPLAINT:  Disseminated cryptococcus  BRIEF This is a 31 year old man w/ HIV/AIDS (last CD4 1 w/ VL 81K). Just started back on ART 2 months ago however he was incarcerated and reported he was not given his medications. He was admitted on 5/22 for perirectal abscess. During admission he was also noted to have HA, fever, chills, night sweats neck pain and LAN. ID was consulted and recommended imaging of chest, LP, and cultures be sent from perirectal I&D. Since that time CT chest results: "Bilateral airspace disease with a small cavitary lesion in the right upper lobe. Findings compatible with pneumonia.Trace right pleural fluid versus right pleural thickening". Also his serum cryptococcal antigenemia titer was elevated raising the possibility of disseminated cryptococcal infection. Including lung involvement. We have been asked to see by infectious disease for bronchoscopy and washings in effort to help determine if CT changes reflect mTB OR possibly pulmonary cryptococcal disease. Initially unable to perform initial bronchoscopy due to hypoxia. 5/28 called by primary for altered LOC. He became very lethargic/obtunded requiring CT and LP, but airway was an issue so PCCM asked to see for ICU transfer and intubation.   SIGNIFICANT EVENTS   STUDIES:  CT head 5/24: nml CT chest 5/24: Bilateral airspace disease with a small cavitary lesion in the right upper lobe. Findings compatible with pneumonia. Trace right pleural fluid versus right pleural thickening. CT abd/pelvis 5/23: Edema tracking along the right side of the gluteal cleft is similar in appearance to the prior study. This reflected a subcutaneous abscess on the prior study. This most likely reflects a persistent small subcutaneous abscess. 2. Diffuse soft tissue edema tracking about the anorectal  canal, without additional abscess. Diffuse presacral soft tissue inflammation noted. 3. Scrotal wall edema, worse on the right, with small bilateral Hydroceles. 4. Mild right basilar airspace opacity raises concern for pneumonia. 5. Mild wall thickening along the distal sigmoid colon and rectum, raising concern for proctitis. 6. Enlarged azygoesophageal recess, retroperitoneal, pelvic sidewall and bilateral inguinal nodes seen, measuring up to 1.7 cm in short axis. This likely reflects the underlying infection. 7. Cholelithiasis.  Gallbladder otherwise unremarkable.  Culture data 5/23 BCX2>>> CRYPTOCOCCUS NEOFORMANS  AFB 5/28>> negative Csf 5/27>>>yeast Surgical wound culture 5/23: few wbc, few gm variable rods>>> Acid fast culture w/ reflexed sensitivities from sputum 5/23>>> afb from sputum 5/23>>> BAL 5/28 > negative BAL fungal 5/28 >> negative so far >>  BAL PCP 5/28 >> negative C diff 5/31 >> negative  ABX azith weekly 5/22>>> Ceftriaxone 5/23>>> Flagyl 5/23>>> Amphotericin B 5/23>>> Flucytosine 5/23>>> Valcyclovir 5/24 > Bactrim 5/24 >  Cefepime 5/22>>>5/23 vanc 5/23 only  EVENT 6/1 - Tolerating pressure support this morning Localizing on the right per RN report 6/2 -  overalll unresponsive off sedation gtt.  On PRVC   SUBJECTIVE/OVERNIGHT/INTERVAL HX 6/3 - interimttenly dysnchronous needing prn sedation. And occ agitated - flexes arms and extends arms with cough. Lowers mostly flaccid but occ will move. Occ blank stare +. Grand mom at bedside feels he is better a bit  VITAL SIGNS: Temp:  [97.9 F (36.6 C)-98.6 F (37 C)] 98.6 F (37 C) (06/03 1130) Pulse Rate:  [62-121] 111 (06/03 1200) Resp:  [12-22] 18 (06/03 1200) BP: (111-161)/(68-120) 126/78 (06/03 1200) SpO2:  [98 %-100 %] 100 % (06/03 1200) FiO2 (%):  [30 %] 30 % (06/03  1134) Weight:  [65.6 kg (144 lb 10 oz)] 65.6 kg (144 lb 10 oz) (06/03 0500)  PHYSICAL EXAMINATION:     General Appearance:     Looks criticall ill   Head:    Normocephalic, without obvious abnormality, atraumatic  Eyes:    PERRL - yes, conjunctiva/corneas - clear      Ears:    Normal external ear canals, both ears  Nose:   NG tube - no  Throat:  ETT TUBE - yes , OG tube - yes  Neck:   Supple,  No enlargement/tenderness/nodules     Lungs:     Clear to auscultation bilaterally, Ventilator   Synchrony - yes  Chest wall:    No deformity  Heart:    S1 and S2 normal, no murmur, CVP - no.  Pressors - no  Abdomen:     Soft, no masses, no organomegaly  Genitalia:    Not done  Rectal:   not done  Extremities:   Extremities- intact     Skin:   Intact in exposed areas .      Neurologic:   Sedation - prn -> RASS - -4 (did open eyes to voice x 1)       PULMONARY  Recent Labs Lab 12/26/16 2028  PHART 7.460*  PCO2ART 31.8*  PO2ART 348.0*  HCO3 22.6  TCO2 24  O2SAT 100.0    CBC  Recent Labs Lab 12/28/16 0512 12/29/16 0304 01/01/17 0249  HGB 8.8* 8.2* 8.0*  HCT 27.7* 26.5* 25.6*  WBC 10.1 6.7 6.7  PLT 155 165 239    COAGULATION No results for input(s): INR in the last 168 hours.  CARDIAC  No results for input(s): TROPONINI in the last 168 hours. No results for input(s): PROBNP in the last 168 hours.   CHEMISTRY  Recent Labs Lab 12/27/16 1810 12/28/16 0512 12/28/16 1605 12/29/16 0304 12/30/16 0340 12/31/16 0326 01/01/17 0249  NA  --  134*  --  137 136 136 135  K  --  4.3  --  4.1 4.3 4.4 4.1  CL  --  110  --  113* 112* 111 108  CO2  --  19*  --  18* 19* 20* 19*  GLUCOSE  --  146*  --  109* 103* 84 94  BUN  --  11  --  _0 CREATININE  --  1.25*  --  1.14 0.99 0.88 0.84  CALCIUM  --  8.0*  --  8.1* 8.4* 8.4* 8.6*  MG 3.1* 2.3 2.0 1.8 1.6* 1.9 1.6*  PHOS 5.1* 4.4 4.1  --   --   --  5.7*   Estimated Creatinine Clearance: 115 mL/min (by C-G formula based on SCr of 0.84 mg/dL).   LIVER  Recent Labs Lab 12/27/16 0524 12/28/16 0512  AST 36 30  ALT 57 42  ALKPHOS 144*  135*  BILITOT 0.7 0.4  PROT 8.5* 8.3*  ALBUMIN 2.3* 2.2*     INFECTIOUS No results for input(s): LATICACIDVEN, PROCALCITON in the last 168 hours.   ENDOCRINE CBG (last 3)   Recent Labs  01/01/17 0326 01/01/17 0817 01/01/17 1128  GLUCAP 109* 93 123*     IMAGING x48h  - image(s) personally visualized  -   highlighted in bold Dg Chest Port 1 View  Result Date: 01/01/2017 CLINICAL DATA:  Endotracheal tube placement EXAM: PORTABLE CHEST 1 VIEW COMPARISON:  12/30/2016 and prior studies FINDINGS: Cardiomegaly again noted. An endotracheal tube with  tip 2.9 cm above the carina and NG tube entering stomach again noted. Decreased left lung opacities and improved aeration since the prior study noted. There is no evidence of pneumothorax or pleural effusion. IMPRESSION: Improving left lung opacities with improved aeration. No other significant change. Electronically Signed   By: Margarette Canada M.D.   On: 01/01/2017 07:08    DISCUSSION: 31 year old male with history HIV/AIDS admitted [facility]/22 from jail for fevers with known perirectal abscess. 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 5/28 worsening mental status requiring intubation for LP and CT. PCCM to assume care. Pressure on LP elevated and drained. 5/29 lumbar drain placement. Progressive changes on MRI brain 5/29, ? Ischemic infarct BG due to inflammation / cryptococcal infection.   Principal Problem:   Perirectal abscess Active Problems:   Chronic pain   AIDS (acquired immune deficiency syndrome) (HCC)   Hyponatremia   Normocytic anemia   Elevated LFTs   Pulmonary infiltrates   Hypomagnesemia   Encephalopathy acute   SIRS (systemic inflammatory response syndrome) (HCC)   Acute respiratory failure (HCC)   Rectal abscess   Cryptococcal meningoencephalitis (HCC)   Cryptococcosis (HCC)   Cavitary pneumonia   Diffuse lymphadenopathy   Drug rash   Elevated intracranial pressure   HIV disease (HCC)    Lymphadenopathy of head and neck    ASSESSMENT / PLAN:  PULMONARY A: Acute hypoxemic respiratory failure Bilateral pulmonary infiltrates - disseminated cryptococcus RUL infiltrate, ? 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) Hypokalemia  01/01/2017 ->  Low mag  P:   Replete mag done Follow BMP, urine output Amphotericin dosing per pharmacy recommendations, per renal function  GASTROINTESTINAL A:   Diarrhea  P:   Rectal tube in place Stool studies pending for enteric pathogens PPI prophylaxis Nothing by mouth  HEMATOLOGIC A:   Anemia, chronic (Hgb at baseline)  P:  Follow CBC Heparin prophylaxis SCD  INFECTIOUS A:   Disseminated cryptococcus, including meningitis, possibly pneumonia Bilateral pulmonary infiltrates, pneumonia with micronodular cavitation No current evidence tuberculosis based on culture data but at risk No current evidence PCP, but at risk Perirectal abscess, drained HIV/AIDS (CD4 count 21) Genital herpes Diarrhea, ? infectious  P:   Appreciate ID management Day 10 L-ampho/flycytosine, note acute renal failure, may need to be adjusted Day 10 acyclovir Day 12 of proph bactrim/weekly azithro Airborne precautions have been lifted with negative AFB Stool culture panel pending. C. 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 Appreciate assistance Appreciate neurology evaluation, particularly with regard to prognosis here. Continuing to treat underlying cause, cryptococcus   FAMILY  - Updates: Status reviewed with the patient's mother at bedside on 5/31. No family at bedside 12/31/16. Mom and grandmom updated 01/01/2017   - Inter-disciplinary family meet  or Palliative Care meeting due by:  6/3      The patient is critically ill with multiple organ systems failure and requires high complexity decision making for assessment and support, frequent evaluation and titration of therapies, application of advanced monitoring technologies and extensive interpretation of multiple databases.   Critical Care Time devoted to patient care services described in this note is  30  Minutes. This time reflects time of care of this signee Dr Brand Males. This critical care time does not reflect procedure time, or teaching time or supervisory time of PA/NP/Med student/Med Resident etc but could involve care discussion time    Dr. Brand Males, M.D., [facility].C.P Pulmonary and Critical Care Medicine Staff Physician [place] Pager: [phone], If no answer or between  15:00h - 7:00h: call [phone]  01/01/2017 12:41 PM

## 2017-01-02 ENCOUNTER — Inpatient Hospital Stay (HOSPITAL_COMMUNITY): Payer: Medicaid Other

## 2017-01-02 DIAGNOSIS — I639 Cerebral infarction, unspecified: Secondary | ICD-10-CM

## 2017-01-02 LAB — GLUCOSE, CAPILLARY
GLUCOSE-CAPILLARY: 100 mg/dL — AB (ref 65–99)
GLUCOSE-CAPILLARY: 88 mg/dL (ref 65–99)
GLUCOSE-CAPILLARY: 101 mg/dL — AB (ref 65–99)
Glucose-Capillary: 105 mg/dL — ABNORMAL HIGH (ref 65–99)
Glucose-Capillary: 109 mg/dL — ABNORMAL HIGH (ref 65–99)
Glucose-Capillary: 91 mg/dL (ref 65–99)

## 2017-01-02 LAB — CULTURE, BLOOD (ROUTINE X 2)
CULTURE: NO GROWTH
Culture: NO GROWTH
SPECIAL REQUESTS: ADEQUATE
Special Requests: ADEQUATE

## 2017-01-02 LAB — CBC WITH DIFFERENTIAL/PLATELET
Basophils Absolute: 0 10*3/uL (ref 0.0–0.1)
Basophils Relative: 0 %
EOS PCT: 0 %
Eosinophils Absolute: 0 10*3/uL (ref 0.0–0.7)
HCT: 26 % — ABNORMAL LOW (ref 39.0–52.0)
Hemoglobin: 8.4 g/dL — ABNORMAL LOW (ref 13.0–17.0)
LYMPHS ABS: 1.4 10*3/uL (ref 0.7–4.0)
LYMPHS PCT: 17 %
MCH: 27.4 pg (ref 26.0–34.0)
MCHC: 32.3 g/dL (ref 30.0–36.0)
MCV: 84.7 fL (ref 78.0–100.0)
MONO ABS: 0.6 10*3/uL (ref 0.1–1.0)
Monocytes Relative: 8 %
Neutro Abs: 6 10*3/uL (ref 1.7–7.7)
Neutrophils Relative %: 75 %
PLATELETS: 287 10*3/uL (ref 150–400)
RBC: 3.07 MIL/uL — ABNORMAL LOW (ref 4.22–5.81)
RDW: 17.6 % — AB (ref 11.5–15.5)
WBC: 8.1 10*3/uL (ref 4.0–10.5)

## 2017-01-02 LAB — BASIC METABOLIC PANEL
Anion gap: 6 (ref 5–15)
BUN: 19 mg/dL (ref 6–20)
CHLORIDE: 108 mmol/L (ref 101–111)
CO2: 20 mmol/L — AB (ref 22–32)
CREATININE: 0.84 mg/dL (ref 0.61–1.24)
Calcium: 9.2 mg/dL (ref 8.9–10.3)
GFR calc Af Amer: >60 mL/min (ref >60)
GFR calc non Af Amer: >60 mL/min (ref >60)
Glucose, Bld: 99 mg/dL (ref 65–99)
Potassium: 4.6 mmol/L (ref 3.5–5.1)
SODIUM: 134 mmol/L — AB (ref 135–145)

## 2017-01-02 LAB — MAGNESIUM: Magnesium: 1.6 mg/dL — ABNORMAL LOW (ref 1.7–2.4)

## 2017-01-02 LAB — PHOSPHORUS: Phosphorus: 5.4 mg/dL — ABNORMAL HIGH (ref 2.5–4.6)

## 2017-01-02 MED ORDER — POTASSIUM CHLORIDE 20 MEQ/15ML (10%) PO SOLN: 40.0000 meq | Freq: Every day | ORAL | Status: DC

## 2017-01-02 MED ORDER — FAMOTIDINE 40 MG/5ML PO SUSR
20.0000 mg | Freq: Two times a day (BID) | ORAL | Status: DC
  Administered 2017-01-02 – 2017-01-03 (×2): 20 mg
  Filled 2017-01-02 (×2): qty 2.5

## 2017-01-02 MED ORDER — SODIUM CHLORIDE 0.9 % IV BOLUS (SEPSIS)
500.0000 mL | INTRAVENOUS | Status: DC
  Administered 2017-01-02 – 2017-01-11 (×10): 500 mL via INTRAVENOUS

## 2017-01-02 MED ORDER — SODIUM CHLORIDE 0.9 % IV BOLUS (SEPSIS)
500.0000 mL | INTRAVENOUS | Status: DC
  Administered 2017-01-03 – 2017-01-11 (×9): 500 mL via INTRAVENOUS

## 2017-01-02 MED ORDER — FENTANYL CITRATE (PF) 100 MCG/2ML IJ SOLN
25.0000 ug | INTRAMUSCULAR | Status: DC | PRN
  Administered 2017-01-02 – 2017-01-03 (×6): 100 ug via INTRAVENOUS
  Filled 2017-01-02 (×6): qty 2

## 2017-01-02 MED ORDER — FENTANYL CITRATE (PF) 100 MCG/2ML IJ SOLN: 50.0000 ug | INTRAMUSCULAR | Status: DC | PRN

## 2017-01-02 MED ORDER — DEXTROSE 5 % IV SOLN
6.0000 g | Freq: Once | INTRAVENOUS | Status: AC
  Administered 2017-01-02: 6 g via INTRAVENOUS
  Filled 2017-01-02: qty 12

## 2017-01-02 MED ORDER — MIDAZOLAM HCL 2 MG/2ML IJ SOLN
1.0000 mg | INTRAMUSCULAR | Status: DC | PRN
  Administered 2017-01-02 – 2017-01-03 (×3): 2 mg via INTRAVENOUS
  Filled 2017-01-02 (×3): qty 2

## 2017-01-02 NOTE — Progress Notes (Signed)
[facility] for Infectious Disease    Date of Admission:  12/20/2016   Total days of antibiotics 13                                                                                     Day 13 L-ampho/flucytosine           ID: William Pena is a 31 y.o. male with advanced hiv disease, CD 4 count of 21/VL1.67M with disseminated Cryptococcal disease including meningitis, bacteremia, and likely pneumonia.   Principal Problem:   Perirectal abscess Active Problems:   Chronic pain   AIDS (acquired immune deficiency syndrome) (HCC)   Hyponatremia   Normocytic anemia   Elevated LFTs   Pulmonary infiltrates   Hypomagnesemia   Encephalopathy acute   SIRS (systemic inflammatory response syndrome) (HCC)   Acute respiratory failure (HCC)   Rectal abscess   Cryptococcal meningoencephalitis (HCC)   Cryptococcosis (HCC)   Cavitary pneumonia   Diffuse lymphadenopathy   Drug rash   Elevated intracranial pressure   HIV disease (HCC)   Lymphadenopathy of head and neck    Subjective: On ventilator, weaning now. Eyes open spontaneously to name. Mom at the bedside.   Medications:  . azithromycin  1,200 mg Per Tube Q Fri  . chlorhexidine gluconate (MEDLINE KIT)  15 mL Mouth Rinse BID  . famotidine  20 mg Per Tube BID  . flucytosine  1,500 mg Per Tube Q6H  . lidocaine (PF)  5 mL Other Once  . mouth rinse  15 mL Mouth Rinse 10 times per day  . sulfamethoxazole-trimethoprim  10 mL Per Tube Daily    Objective: Vital signs in last 24 hours: Temp:  [97.5 F (36.4 C)-99.1 F (37.3 C)] 97.5 F (36.4 C) (06/04 1200) Pulse Rate:  [69-95] 75 (06/04 1300) Resp:  [11-25] 14 (06/04 1300) BP: (111-159)/(27-100) 113/74 (06/04 1300) SpO2:  [99 %-100 %] 100 % (06/04 1300) FiO2 (%):  [30 %] 30 % (06/04 1119) Weight:  [148 lb 9.4 oz (67.4 kg)] 148 lb 9.4 oz (67.4 kg) (06/04 0500)   General appearance: alert, no distress and calm lying in bed.  Eyes: conjunctivae/corneas clear. PERRL, EOM's  intact. Fundi benign. Neck: no carotid bruit, no JVD, supple, symmetrical, trachea midline and improved lymphadenopathy. No nuchal rigidity with passive neck ROM Resp: clear to auscultation Cardio: regular rate and rhythm, S1, S2 normal, no murmur, click, rub or gallop Male genitalia: deferred Extremities: extremities normal, atraumatic, no cyanosis or edema Skin: Skin color, texture, turgor normal. No rashes or lesions Lymph nodes: Overall improvement in lymphadenopathy  Neurologic: Alert and opens eyes spontaneously. Tracking with movement across the bed. Following some commands for me today. Moves all extremities.   Lab Results  Recent Labs  01/01/17 0249 01/02/17 0210  WBC 6.7 8.1  HGB 8.0* 8.4*  HCT 25.6* 26.0*  NA 135 134*  K 4.1 4.6  CL 108 108  CO2 19* 20*  BUN 16 19  CREATININE 0.84 0.84   Liver Panel No results for input(s): PROT, ALBUMIN, AST, ALT, ALKPHOS, BILITOT, BILIDIR, IBILI in the last 72 hours. Sedimentation Rate No results for input(s): ESRSEDRATE in  the last 72 hours. C-Reactive Protein No results for input(s): CRP in the last 72 hours.  Microbiology: 6/1 csf >> cx pending 5/30 >> blood cx NG x 4 days 5/30 aspirate trach- afb smear negative, pending CSF analysis- pulled for LD apparatus showing still fungal elements on gram stain. WBC 163 with 92% lymphocytic predominance, also 2,900 RBC  Studies/Results: Dg Chest Port 1 View  Result Date: 01/02/2017 CLINICAL DATA:  Hypoxia EXAM: PORTABLE CHEST 1 VIEW COMPARISON:  January 01, 2017 FINDINGS: Endotracheal tube tip is 3.0 cm above the carina. Nasogastric tube side port is at the gastroesophageal junction with the nasogastric tube tip below the diaphragm and not seen. There is no pneumothorax. There is no edema or consolidation. Heart is mildly enlarged with pulmonary vascularity within normal limits. No adenopathy. No evident bone lesions. IMPRESSION: Tube positions as described without pneumothorax. Nasogastric  tube side port is at the gastroesophageal junction. Advise advancing nasogastric tube approximately 5 cm. No edema or consolidation. Stable cardiac prominence. Electronically Signed   By: Lowella Grip III M.D.   On: 01/02/2017 07:19   Dg Chest Port 1 View  Result Date: 01/01/2017 CLINICAL DATA:  Endotracheal tube placement EXAM: PORTABLE CHEST 1 VIEW COMPARISON:  12/30/2016 and prior studies FINDINGS: Cardiomegaly again noted. An endotracheal tube with tip 2.9 cm above the carina and NG tube entering stomach again noted. Decreased left lung opacities and improved aeration since the prior study noted. There is no evidence of pneumothorax or pleural effusion. IMPRESSION: Improving left lung opacities with improved aeration. No other significant change. Electronically Signed   By: Margarette Canada M.D.   On: 01/01/2017 07:08     Assessment/Plan: no further fevers and WBC stable. BCx from 5/29 show NG x 4 days. Can have PICC placed tomorrow.   CM c/b small vessel infarcts =  - Continue on current regimen of L-ampho and flucytosine. Induction will likely need to be longer 14d with burden of disease, duration at this point will be determined with further culture info.  - Lumbar drain in place still; Straw colored - mom reports they are weaning from device soon - will d/w Dr. [name] if further sampling is desired  - CSF Cx 12/30/16 - encapsulated yeast observed on GS, nothing on culture thus far day 3 - Repeat LP showing lymphocytic predominance and yeast on gram stain. So far at day 3 culture is displaying no growth; hoping his CSF is becoming sterile - Continually needing mag replenishment  - Neuro status slightly improved with increased eye opening   HIV Disease = - tx deferred due to CM treatment - OI proph - continue with bactrim ss daily + Azith weekly   Perirectal abscess=  - received a short course of abtx after debridement - continue with wound care  Pneumonia =  - Thought to be due to  cryptococcal infection.  - mtB PCR and AFB negative - CXR images reviewed over the last few days and decreased lung opacities and improved aeration noted  Lymphadenopathy =  - Deferred biopsy at this time. If more fevers, would consider to biopsy for mac  HSV genital lesions =  - Current on valtrex day 13 / 14  Updated mother with the above plan.   [name], MSN, NP-C [facility] for Infectious [place] Cell: [phone] Pager: [phone]  01/02/2017, 1:57 PM

## 2017-01-02 NOTE — Progress Notes (Signed)
Pharmacy Consult:  Electrolyte Replacement  68 yom with advanced HIV disease currently on treatment for disseminated cryptococcal disease with bacteremia and meningitis. Pharmacy consulted to manage electrolytes. SCr up 1.25 (fluid boluses pre and post-ambisome added back 5/30).  K up to 4.6 today on 40meq daily Mag remains low at 1.6 despite aggressive repletion   Plan: Hold potassium today and resume tomorow Magnesium 6gm IV x 1  Monitor K and Mag closely and order additional replacement prn  Salome Arnt, PharmD, BCPS 01/02/2017 8:16 AM

## 2017-01-02 NOTE — Progress Notes (Addendum)
[facility] Attending Note  ADMISSION DATE:  12/20/2016  CONSULTATION DATE:  12/22/2016  REFERRING MD :  Carlyle Basques, M.D.  CHIEF COMPLAINT:  Disseminated cryptococcus  Presenting HPI:  31 y.o. male with HIV/AIDS (last CD4 1 w/ VL 81K). Just started back on ART 2 months ago however he was incarcerated and reported he was not given his medications. He was admitted on 5/22 for perirectal abscess. During admission he was also noted to have HA, fever, chills, night sweats neck pain and LAN. ID was consulted and recommended imaging of chest, LP, and cultures be sent from perirectal I&D. Since that time CT chest results: "Bilateral airspace disease with a small cavitary lesion in the right upper lobe. Findings compatible with pneumonia.Trace right pleural fluid versus right pleural thickening". Also his serum cryptococcal antigenemia titer was elevated raising the possibility of disseminated cryptococcal infection. Including lung involvement. We have been asked to see by infectious disease for bronchoscopy and washings in effort to help determine if CT changes reflect mTB OR possibly pulmonary cryptococcal disease. Initially unable to perform initial bronchoscopy due to hypoxia. 5/28 called by primary for altered LOC. He became very lethargic/obtunded requiring CT and LP, but airway was an issue so PCCM asked to see for ICU transfer and intubation.  Subjective:  No acute events overnight. Patient more awake this morning and her nurse was following some commands.  Review of Systems:  Unable to obtain given altered mentation and intubation.   Vent Mode: PSV;CPAP FiO2 (%):  [30 %] 30 % Set Rate:  [16 bmp] 16 bmp Vt Set:  [510 mL] 510 mL PEEP:  [5 cmH20] 5 cmH20 Pressure Support:  [5 cmH20-8 cmH20] 8 cmH20 Plateau Pressure:  [12 cmH20-14 cmH20] 14 cmH20  Temp:  [97.6 F (36.4 C)-99.1 F (37.3 C)] 99.1 F (37.3 C) (06/04 0800) Pulse Rate:  [70-111] 90 (06/04 0800) Resp:   [11-25] 16 (06/04 0800) BP: (111-159)/(27-100) 135/27 (06/04 0800) SpO2:  [99 %-100 %] 100 % (06/04 0831) FiO2 (%):  [30 %] 30 % (06/04 0831) Weight:  [148 lb 9.4 oz (67.4 kg)] 148 lb 9.4 oz (67.4 kg) (06/04 0500)   General:  Mother at bedside. Intubated. No distress. Integument:  Warm & dry. No rash on exposed skin. Multiple tattoos noted. HEENT:  Moist mucus memebranes. No scleral icterus. Endotracheal tube in place. Neurological:  Pupils symmetric. Larynx on command and tends to voice. No purposeful or spontaneous movements and extremities. Musculoskeletal:  No joint effusion or erythema appreciated. Symmetric muscle bulk. Pulmonary:  Symmetric chest wall rise on ventilator. Coarse breath sounds bilaterally. Cardiovascular:  Regular rate & rhythm. No appreciable JVD. Normal S1 & S2. Telemetry:  Sinus rhythm. Abdomen:  Soft. Nondistended. Normoactive bowel sounds.  LINES/TUBES: OETT 8.0 5/28 >>> Lumbar Drain 5/28 >>> Foley 5/28 >>> OGT 5/28 >>> PIV  CBC Latest Ref Rng & Units 01/02/2017 01/01/2017 12/29/2016  WBC 4.0 - 10.5 K/uL 8.1 6.7 6.7  Hemoglobin 13.0 - 17.0 g/dL 8.4(L) 8.0(L) 8.2(L)  Hematocrit 39.0 - 52.0 % 26.0(L) 25.6(L) 26.5(L)  Platelets 150 - 400 K/uL 287 239 165    BMP Latest Ref Rng & Units 01/02/2017 01/01/2017 12/31/2016  Glucose 65 - 99 mg/dL 99 94 84  BUN 6 - 20 mg/dL _0 Creatinine 0.61 - 1.24 mg/dL 0.84 0.84 0.88  Sodium 135 - 145 mmol/L 134(L) 135 136  Potassium 3.5 - 5.1 mmol/L 4.6 4.1 4.4  Chloride 101 - 111 mmol/L 108 108 111  CO2 22 - 32 mmol/L 20(L) 19(L) 20(L)  Calcium 8.9 - 10.3 mg/dL 9.2 8.6(L) 8.4(L)    Hepatic Function Latest Ref Rng & Units 12/28/2016 12/27/2016 12/22/2016  Total Protein 6.5 - 8.1 g/dL 8.3(H) 8.5(H) 7.7  Albumin 3.5 - 5.0 g/dL 2.2(L) 2.3(L) 2.2(L)  AST 15 - 41 U/L 30 36 154(H)  ALT 17 - 63 U/L 42 57 89(H)  Alk Phosphatase 38 - 126 U/L 135(H) 144(H) 82  Total Bilirubin 0.3 - 1.2 mg/dL 0.4 0.7 0.8  Bilirubin, Direct 0.1 - 0.5  mg/dL - 0.2 -    IMAGING/STUDIES: CT HEAD W/O 5/29:  Abnormal low-density in the left basal ganglia which may reflect worsening infection or acute infarction. MRI BRAIN W/O 5/29:  Worsening/progression of small vessel occlusive infarctions along the base the brain bilaterally with new involvement affecting a large portion of the corpus striatum on the left. Worsening of small vessel occlusive infarctions along the superior surface of the brain an underlying white matter as well. Findings are consistent with progressive complication of meningitis. PORT CXR 6/4:  Personally reviewed by me. Endotracheal tube 3.7 cm above carina. No focal opacity. Enteric feeding tube coursing below diaphragm and side-port at gastroesophageal junction.  MICROBIOLOGY: MRSA PCR 5/23:  Negative  Blood Cultures x2 5/22:  2/2 Bottles Positive Cryptococcus neoformans  Blood Cultures x2 5/23:  2/2 Bottles Positive Cryptococcus neoformans  Wound Culture 5/23:  Multiple organisms present CSF Culture 5/24:  Abundant Cryptococcus neoformans  Quantiferon TB 5/26:  Negative  CSF Culture 5/27:  Cryptococcus neoformans  BAL RML 5/28:  Cryptococcus neoformans / AFB smear negative & culture pending Tracheal Aspirate AFB 5/30 >>> Smear negative / Ctx pending Blood Cultures x2 5/30 >>> Stool Culture 5/31 >>> Stool Gastrointestinal Panel 5/31:  Negative Stool C diff 5/31:  Negative  CSF Culture 6/1 >>>  ANTIBIOTICS: Triumeq 5/23 - 5/24 Flagyl 5/23 - 5/24 Vancomycin 5/23 - 5/24 Doxycycline 5/24 - 5/25 Rocephin 5/23 - 5/30 Augmentin 5/24 - 5/25 Bactrim 5/23 >>> Flucytosine 5/24 >>> Valtrex 5/24 >>> Azithromycin qweek 5/24 >>> Ambisome 5/24 >>>  SIGNIFICANT EVENTS: 05/22 - Admit with perirectal abscess  ASSESSMENT/PLAN:  31 y.o. 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 Patient does have underlying HIV/AIDS.  1. Disseminated  cryptococcal infection: Continuing treatment as per ID recommendations. 2. Acute hypoxic respiratory failure: Secondary to cryptococcal pneumonia. Continuing full ventilator support with daily pressure support wean. Plan for spontaneous breathing trial and possible extubation as mental status allows. 3. Acute encephalopathy: Multifactorial from meningoencephalitis and medications. Attempting to limit sedation with when necessary Versed and fentanyl as needed. 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 Continuing LP drain according to their recommendations. 5. Perirectal abscess: Status post drainage. Currently off antibiotic therapy. 6. Genital herpes: Continuing acyclovir. Treatment per ID recommendations. 7. HIV/AIDS: Management per ID. Patient continuing on azithromycin & Bactrim for prophylaxis. 8. Diarrhea: Stool culture pending. Otherwise infectious workup negative. 9. 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 Plan for PT/OT and speech consults once patient is extubated. 10. Hypomagnesemia: Status post IV magnesium sulfate. Repeat electrolytes in the morning. 11. Hyponatremia: Mild. Stable. Monitor electrolytes daily. 12. Acute renal failure: Resolving. Continuing to monitor urine output and trending electrolytes daily. 13. Anemia: Suspect secondary to anemia of chronic disease. No signs of active bleeding. Trending cell counts daily with CBC.  Prophylaxis:  SCDs & Pepcid via tube BID. Diet:  NPO. Continuing tube feedings. Code Status:  Full Code  per previous physician discussions.   Disposition:  Remains critically ill in the ICU. Likely will need Trach & PEG tube placement later this week. Family Update: Mother updated at bedside during rounds & father updated via phone.  I have personally spent a total of 37 minutes of critical care time today caring for the patient, updating his family, & reviewing the patient's electronic  medical record.  Sonia Baller Ashok Cordia, M.D. [facility] Pager:  [phone] After 3pm or if no response, call [phone] 10:27 AM 01/02/17

## 2017-01-02 NOTE — Progress Notes (Signed)
Neurology Progress Note  I am assuming neurologic care of the patient from Dr. Cheral Marker. I have reviewed his chart at length. History is limited to chart review. The patient is unable to provide any information as he is presently intubated and sedated. RN gave him fentanyl shortly prior to my assessment.   In brief, this is a 31 year old man with history of HIV/AIDS who initially presented on 12/20/16 after presenting with fever and rectal pain in the setting of recurrent perirectal abscesses. Per notes, he generally follows at [facility] infectious disease for management of his aids. At the time of his presentation, he was incarcerated here in [place]. CD4 count on the day of admission was 21 (previously 1 and March 2018) with a viral load of 1,550,000. He was seen in consultation by infectious disease. He was placed on vancomycin, ceftriaxone, and metronidazole. He was complaining of headache and neck pain raising concern for possible cryptococcal meningitis. He was taken to the OR on the afternoon of 12/21/16 for I&D of his perirectal abscess. CT scan of the head from 5/24 was unremarkable. A CT scan of the chest that showed bilateral airspace disease with a cavitary lesion in the right upper lobe, concerning for TB, other Michael bacterial infection, or fungal infection. He had a lumbar puncture on 12/22/16 which showed a normal opening pressure. CSF was notable for positive cryptococcal antigen with titer greater than 1:2056. CSF grew budding yeast and blood cultures were positive for cryptococcus. He was placed on  amphotericin B and flucytosine.   On 5/28, he complained of increasing headache and ultimately became obtunded with bradycardia and reports of decreased movement of the left side. Neurology consultation was requested and he was seen by Dr. Leonel Ramsay. He was sent for an emergent CTH and apparently had Cheyne-Stokes respirations with occasional long pauses during transport to the scanner. He was  taken from CT to the ICU for emergent intubation. On exam he was noted to have equal pupils with purposeful movement of the R side, limited withdrawal in the LLE, and no movement of the LUE. [place] reportedly showed no acute abnormality. LP was performed at the bedside and opening pressure was noted to be 54 with mild CSF pleocytosis and markedly elevated protein. Due to concern for possible stroke in the setting of cryptococcal meningitis, MRI brain was obtained and showed restricted diffusion in the basal ganglia bilaterally with mild leptomeningeal enhancement. Lumbar drain was placed by neurosurgery on 5/30. After returning to the ICU from the OR, he was no longer arousable and was unresponsive to noxious stimulation. Stat MRI brain was obtained and showed extension of the diffusion restriction in the left basal ganglia with largely unchanged involvement of the right basal ganglia. This was felt to represent ischemic infarction secondary to perivascular infiltration by cryptococcal infection. He was maintained on antibiotics and antimicrobials. He has remained intubated with sedation in the ICU. Notes indicate that he has had some slight improvements in his neurologic status over the past few days, opening his eyes to noxious stimuli. He has been noted to have some occasional posturing both spontaneously and with noxious stimuli. Today, however, his nurse reports that he seems much more alert. She says that he has been able to keep communicate by blinking his eyes twice for yes. She says that he has not really been following commands but does seem to have more movement of the right upper extremity. She gave him a bath shortly before my arrival and says that she given fentanyl  after the bath so he was somewhat sedated during my visit.  Medications reviewed and reconciled.   Pertinent meds: Amphotericin B 270 mg daily 8 azithromycin 1200 mg weekly Flucytosine 1500 mg every 6 hours Bactrim 10 mL  daily  Current Meds:   Current Facility-Administered Medications:  .  0.9 %  sodium chloride infusion, , Intravenous, Continuous, Elgergawy, Silver Huguenin, MD, Last Rate: 75 mL/hr at 01/02/17 1000 .  amphotericin B liposome (AMBISOME) 270 mg in dextrose 5 % 500 mL IVPB, 4 mg/kg, Intravenous, Q24H, Ricka Burdock, RPH, Stopped at 01/01/17 1251 .  azithromycin (ZITHROMAX) 200 MG/5ML suspension 1,200 mg, 1,200 mg, Per Tube, Q Jiles Garter, Elisabeth Cara, MD, 1,200 mg at 12/30/16 0955 .  chlorhexidine gluconate (MEDLINE KIT) (PERIDEX) 0.12 % solution 15 mL, 15 mL, Mouth Rinse, BID, Halford Chessman, Vineet, MD, 15 mL at 01/02/17 0744 .  [DISCONTINUED] diphenhydrAMINE (BENADRYL) capsule 25 mg, 25 mg, Oral, Q6H PRN, 25 mg at 12/24/16 0315 **OR** diphenhydrAMINE (BENADRYL) injection 25 mg, 25 mg, Intravenous, Q6H PRN, Rayburn, Kelly A, PA-C, 25 mg at 12/22/16 2229 .  docusate (COLACE) 50 MG/5ML liquid 100 mg, 100 mg, Per Tube, BID PRN, Chesley Mires, MD .  famotidine (PEPCID) 40 MG/5ML suspension 20 mg, 20 mg, Per Tube, BID, Nestor, Sonia Baller, MD .  feeding supplement (VITAL AF 1.2 CAL) liquid 1,000 mL, 1,000 mL, Per Tube, Continuous, Sood, Vineet, MD, Last Rate: 70 mL/hr at 01/01/17 2203, 1,000 mL at 01/01/17 2203 .  fentaNYL (SUBLIMAZE) injection 25-100 mcg, 25-100 mcg, Intravenous, Q1H PRN, Javier Glazier, MD .  flucytosine (ANCOBON) capsule 1,500 mg, 1,500 mg, Per Tube, Q6H, Chesley Mires, MD, 1,500 mg at 01/02/17 0744 .  lidocaine (PF) (XYLOCAINE) 1 % injection 5 mL, 5 mL, Other, Once, Dixon, [employer], NP .  magnesium sulfate 6 g in dextrose 5 % 100 mL IVPB, 6 g, Intravenous, Once, Rumbarger, Valeda Malm, RPH, Last Rate: 37.3 mL/hr at 01/02/17 1000, 6 g at 01/02/17 1000 .  MEDLINE mouth rinse, 15 mL, Mouth Rinse, 10 times per day, Chesley Mires, MD, 15 mL at 01/02/17 1004 .  midazolam (VERSED) injection 1-4 mg, 1-4 mg, Intravenous, Q1H PRN, Javier Glazier, MD .  [DISCONTINUED] ondansetron (ZOFRAN-ODT) disintegrating  tablet 4 mg, 4 mg, Oral, Q6H PRN **OR** ondansetron (ZOFRAN) injection 4 mg, 4 mg, Intravenous, Q6H PRN, Rayburn, Kelly A, PA-C, 4 mg at 12/22/16 1939 .  polyethylene glycol (MIRALAX / GLYCOLAX) packet 17 g, 17 g, Oral, Daily PRN, Rayburn, Kelly A, PA-C .  sulfamethoxazole-trimethoprim (BACTRIM,SEPTRA) 200-40 MG/5ML suspension 10 mL, 10 mL, Per Tube, Daily, Dixon, Melton Krebs, NP, 10 mL at 01/02/17 1002  Objective:  Temp:  [97.6 F (36.4 C)-99.1 F (37.3 C)] 99.1 F (37.3 C) (06/04 0800) Pulse Rate:  [70-111] 85 (06/04 1000) Resp:  [11-25] 20 (06/04 1000) BP: (111-159)/(27-100) 117/39 (06/04 1000) SpO2:  [99 %-100 %] 100 % (06/04 1119) FiO2 (%):  [30 %] 30 % (06/04 1119) Weight:  [67.4 kg (148 lb 9.4 oz)] 67.4 kg (148 lb 9.4 oz) (06/04 0500)  General: WDWN AA man lying in ICU bed. [name] intubated and appears mildly sedated. He will open his eyes to voice and will briefly fix and track but does not make any attempts to communicate. Per RN, he has been able to blink twice to indicate yes this morning and she has had some communication with him via this approach. [name] not following any commands for me.  HEENT: Neck is supple without lymphadenopathy.  ETT and OGT in place. Sclerae are anicteric. There is no conjunctival injection.  CV: Regular, no murmur. Carotid pulses are 2+ and symmetric with no bruits. Distal pulses 2+ and symmetric.  Lungs: CTAB on anterior exam. Ventilated.  Extremities: No C/C/E. Numerous tattoos noted over body and face.  Neuro: MS: As noted above.  CN: Pupils are equal and reactive from 3-->2 mm bilaterally. He weakly blinks to visual threat. Eyes are conjugate with no forced deviation and no nystagmus. Volitional horizontal saccades appear intact. Corneals are present. His face appears grossly symmetric. He weakly resists passive eye opening bilaterally. The remainder of his cranial nerve exam is limited as he is unable to participate.  Motor: Normal bulk. Tone is  reduced diffusely. He does not participate with confrontational strength testing. Some spontaneous movements of BUE is noted. No tremor or other abnormal movements are observed.  Sensation: Weak flexion withdrawal noted to nailbed pressure x4.  DTRs: 3+, symmetric. Toes are mute bilaterally.  Coordination/gait: These cannot be assessed as he does not participate with the exam.   Labs: Lab Results  Component Value Date   WBC 8.1 01/02/2017   HGB 8.4 (L) 01/02/2017   HCT 26.0 (L) 01/02/2017   PLT 287 01/02/2017   GLUCOSE 99 01/02/2017   TRIG 144 12/26/2016   ALT 42 12/28/2016   AST 30 12/28/2016   NA 134 (L) 01/02/2017   K 4.6 01/02/2017   CL 108 01/02/2017   CREATININE 0.84 01/02/2017   BUN 19 01/02/2017   CO2 20 (L) 01/02/2017   INR 1.20 12/22/2016   CBC Latest Ref Rng & Units 01/02/2017 01/01/2017 12/29/2016  WBC 4.0 - 10.5 K/uL 8.1 6.7 6.7  Hemoglobin 13.0 - 17.0 g/dL 8.4(L) 8.0(L) 8.2(L)  Hematocrit 39.0 - 52.0 % 26.0(L) 25.6(L) 26.5(L)  Platelets 150 - 400 K/uL 287 239 165    No results found for: HGBA1C Lab Results  Component Value Date   ALT 42 12/28/2016   AST 30 12/28/2016   ALKPHOS 135 (H) 12/28/2016   BILITOT 0.4 12/28/2016   RPR nonreactive HIV viral load 1,550,000 CD4 count 21   From 12/22/16: CSF WBC 4 CSF RBC 106 CSF glucose 44 CSF protein 72 Cryptococcal antigen positive with titer greater than or equal to 2560 CSF culture positive for cryptococcus neoformans  From 12/25/16: CSF WBC 29, 75% neutrophils CSF RBCs 9 CSF glucose 45 CSF protein 122 Cryptococcal antigen positive with titer greater than 2560 CSF culture positive for cryptococcus neoformans  From 12/30/16: CSF WBC 163, 98% mononuclear CSF RBC 2900 CSF glucose and protein not measured Cryptococcal antigen positive with titer 1280 CSF culture no growth to date  Radiology:  I have personally and independently reviewed the MRI scan of the brain without contrast from 12/27/16. This shows  areas of restricted diffusion involving the basal ganglia bilaterally. This is much more pronounced in the left caudate and putamen. Additional areas of restricted diffusion are seen in the frontal and parietal lobes. All of these are consistent with areas of acute small vessel ischemic infarctions.  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 Most recent CSF culture from 12/30/16 show no further cryptococcal growth and the titer appears to be trending down. Continue amphotericin B and flucytosine. Per ID notes, he will require treatment beyond the typical 14 day course given the extent of his disease. Will defer to ID for ongoing guidance. Appreciate neurosurgery  assistance with lumbar drain. They will likely attempt to start weaning in the next 24 hours or so if he continues to do well.  2. Acute ischemic stroke: This is acute, due to small vessel involvement from his cryptococcal infection. Continue with antifungal therapy as above. No role for antiplatelet agents or statins in this setting.  3. Acute encephalopathy: This is due to cryptococcal meningitis and acute strokes with additional contributions likely from metabolic derangements, medication effect, and respiratory failure. Continue supportive care. Continue to optimize metabolic status as you are. Continue to treat underlying infections as you are. Minimize sedation as tolerated. Avoid anything with strong anticholinergic properties that this is likely to worsen his encephalopathy.  4. 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 CD4 count this admission is 21 with high viral load greater than 1 million. Currently not receiving antiretroviral therapy in the setting of cryptococcal treatment. Appreciate ID assistance.  This was discussed with the patient's mother at the bedside. Education was provided on the diagnosis and expected evaluation and treatment.  She is in agreement with the plan as noted. She was given the opportunity to ask any questions and these were addressed to her satisfaction.   This patient is critically ill and at significant risk of neurological worsening, death and care requires constant monitoring of vital signs, hemodynamics,respiratory and cardiac monitoring, neurological assessment, discussion with family, other specialists and medical decision making of high complexity. A total of 40 minutes of critical care time was spent on this case.   Melba Coon, MD [facility]

## 2017-01-02 NOTE — Progress Notes (Signed)
Neurologically improved overnight, following commands RUE. LD functional at 20cc q2 hrs  - Will cont drainage at 20q2, if stable likely wean tomorrow

## 2017-01-03 LAB — STOOL CULTURE REFLEX - RSASHR

## 2017-01-03 LAB — CBC WITH DIFFERENTIAL/PLATELET
BASOS ABS: 0 10*3/uL (ref 0.0–0.1)
Basophils Relative: 1 %
EOS ABS: 0 10*3/uL (ref 0.0–0.7)
EOS PCT: 1 %
HCT: 24.6 % — ABNORMAL LOW (ref 39.0–52.0)
Hemoglobin: 7.9 g/dL — ABNORMAL LOW (ref 13.0–17.0)
Lymphocytes Relative: 14 %
Lymphs Abs: 0.8 10*3/uL (ref 0.7–4.0)
MCH: 27.3 pg (ref 26.0–34.0)
MCHC: 32.1 g/dL (ref 30.0–36.0)
MCV: 85.1 fL (ref 78.0–100.0)
Monocytes Absolute: 0.8 10*3/uL (ref 0.1–1.0)
Monocytes Relative: 14 %
Neutro Abs: 4 10*3/uL (ref 1.7–7.7)
Neutrophils Relative %: 70 %
PLATELETS: 257 10*3/uL (ref 150–400)
RBC: 2.89 MIL/uL — AB (ref 4.22–5.81)
RDW: 17.4 % — ABNORMAL HIGH (ref 11.5–15.5)
WBC: 5.6 10*3/uL (ref 4.0–10.5)

## 2017-01-03 LAB — MAGNESIUM: MAGNESIUM: 2 mg/dL (ref 1.7–2.4)

## 2017-01-03 LAB — STOOL CULTURE REFLEX - CMPCXR

## 2017-01-03 LAB — RENAL FUNCTION PANEL
Albumin: 2.6 g/dL — ABNORMAL LOW (ref 3.5–5.0)
Anion gap: 6 (ref 5–15)
BUN: 20 mg/dL (ref 6–20)
CALCIUM: 9.1 mg/dL (ref 8.9–10.3)
CHLORIDE: 104 mmol/L (ref 101–111)
CO2: 23 mmol/L (ref 22–32)
CREATININE: 0.91 mg/dL (ref 0.61–1.24)
Glucose, Bld: 90 mg/dL (ref 65–99)
Phosphorus: 5.4 mg/dL — ABNORMAL HIGH (ref 2.5–4.6)
Potassium: 4.6 mmol/L (ref 3.5–5.1)
SODIUM: 133 mmol/L — AB (ref 135–145)

## 2017-01-03 LAB — GLUCOSE, CAPILLARY
GLUCOSE-CAPILLARY: 85 mg/dL (ref 65–99)
GLUCOSE-CAPILLARY: 102 mg/dL — AB (ref 65–99)
GLUCOSE-CAPILLARY: 98 mg/dL (ref 65–99)
Glucose-Capillary: 127 mg/dL — ABNORMAL HIGH (ref 65–99)
Glucose-Capillary: 89 mg/dL (ref 65–99)
Glucose-Capillary: 89 mg/dL (ref 65–99)

## 2017-01-03 LAB — STOOL CULTURE: E coli, Shiga toxin Assay: NEGATIVE

## 2017-01-03 LAB — PATHOLOGIST SMEAR REVIEW

## 2017-01-03 MED ORDER — FENTANYL CITRATE (PF) 100 MCG/2ML IJ SOLN: 25.0000 ug | INTRAMUSCULAR | Status: DC | PRN

## 2017-01-03 MED ORDER — FAMOTIDINE 40 MG/5ML PO SUSR: 20.0000 mg | Freq: Every day | ORAL | Status: DC

## 2017-01-03 MED ORDER — HEPARIN SODIUM (PORCINE) 5000 UNIT/ML IJ SOLN
5000.0000 [IU] | Freq: Three times a day (TID) | INTRAMUSCULAR | Status: DC
  Administered 2017-01-03 – 2017-01-19 (×50): 5000 [IU] via SUBCUTANEOUS
  Filled 2017-01-03 (×49): qty 1

## 2017-01-03 MED ORDER — ACETAMINOPHEN 650 MG RE SUPP
650.0000 mg | Freq: Four times a day (QID) | RECTAL | Status: DC | PRN
  Administered 2017-01-03: 650 mg via RECTAL
  Filled 2017-01-03: qty 1

## 2017-01-03 NOTE — Progress Notes (Signed)
[facility] Attending Note  ADMISSION DATE:  12/20/2016  CONSULTATION DATE:  12/22/2016  REFERRING MD :  Carlyle Basques, M.D.  CHIEF COMPLAINT:  Disseminated cryptococcus  Presenting HPI:  31 y.o. male with HIV/AIDS (last CD4 1 w/ VL 81K). Just started back on ART 2 months ago however he was incarcerated and reported he was not given his medications. He was admitted on 5/22 for perirectal abscess. During admission he was also noted to have HA, fever, chills, night sweats neck pain and LAN. ID was consulted and recommended imaging of chest, LP, and cultures be sent from perirectal I&D. Since that time CT chest results: "Bilateral airspace disease with a small cavitary lesion in the right upper lobe. Findings compatible with pneumonia.Trace right pleural fluid versus right pleural thickening". Also his serum cryptococcal antigenemia titer was elevated raising the possibility of disseminated cryptococcal infection. Including lung involvement. We have been asked to see by infectious disease for bronchoscopy and washings in effort to help determine if CT changes reflect mTB OR possibly pulmonary cryptococcal disease. Initially unable to perform initial bronchoscopy due to hypoxia. 5/28 called by primary for altered LOC. He became very lethargic/obtunded requiring CT and LP, but airway was an issue so PCCM asked to see for ICU transfer and intubation.  Subjective:  No acute events overnight. Patient received a total of 766mg Fentanyl & 65mVersed in last 24 hours.   Review of Systems:  Unable to obtain given altered mentation and intubation.   Vent Mode: PRVC FiO2 (%):  [30 %] 30 % Set Rate:  [16 bmp] 16 bmp Vt Set:  [510 mL] 510 mL PEEP:  [5 cmH20] 5 cmH20 Pressure Support:  [5 cmH20-8 cmH20] 8 cmH20 Plateau Pressure:  [15 cmH20-16 cmH20] 16 cmH20  Temp:  [97.5 F (36.4 C)-98.1 F (36.7 C)] 98.1 F (36.7 C) (06/05 0800) Pulse Rate:  [49-85] 78 (06/05 0700) Resp:  [12-20]  15 (06/05 0700) BP: (98-136)/(39-81) 119/67 (06/05 0700) SpO2:  [100 %] 100 % (06/05 0700) FiO2 (%):  [30 %] 30 % (06/05 0800) Weight:  [135 lb 5.8 oz (61.4 kg)] 135 lb 5.8 oz (61.4 kg) (06/05 0500)   General:  Awake. No distress. Mother at bedside.  Integument:  Warm & dry. No rash on exposed skin. No bruising. Tattoos noted. Extremities:  No cyanosis or clubbing.  HEENT:  Moist mucus membranes. No scleral injection or icterus. Endotracheal tube in place.  Cardiovascular:  Regular rate. No edema. No appreciable JVD.  Pulmonary:  Clear to auscultation bilaterally. Symmetric chest wall expansion on ventilator. Abdomen: Soft. Normal bowel sounds. Nondistended. Neurological: Normal muscle bulk. Seems to attend to voice. Does not follow commands.  LINES/TUBES: OETT 8.0 5/28 >>> Lumbar Drain 5/28 >>> Foley 5/28 >>> OGT 5/28 >>> PIV  CBC Latest Ref Rng & Units 01/03/2017 01/02/2017 01/01/2017  WBC 4.0 - 10.5 K/uL 5.6 8.1 6.7  Hemoglobin 13.0 - 17.0 g/dL 7.9(L) 8.4(L) 8.0(L)  Hematocrit 39.0 - 52.0 % 24.6(L) 26.0(L) 25.6(L)  Platelets 150 - 400 K/uL 257 287 239    BMP Latest Ref Rng & Units 01/03/2017 01/02/2017 01/01/2017  Glucose 65 - 99 mg/dL 90 99 94  BUN 6 - 20 mg/dL _0 Creatinine 0.61 - 1.24 mg/dL 0.91 0.84 0.84  Sodium 135 - 145 mmol/L 133(L) 134(L) 135  Potassium 3.5 - 5.1 mmol/L 4.6 4.6 4.1  Chloride 101 - 111 mmol/L 104 108 108  CO2 22 - 32 mmol/L 23 20(L) 19(L)  Calcium 8.9 - 10.3 mg/dL 9.1 9.2 8.6(L)    Hepatic Function Latest Ref Rng & Units 01/03/2017 12/28/2016 12/27/2016  Total Protein 6.5 - 8.1 g/dL - 8.3(H) 8.5(H)  Albumin 3.5 - 5.0 g/dL 2.6(L) 2.2(L) 2.3(L)  AST 15 - 41 U/L - 30 36  ALT 17 - 63 U/L - 42 57  Alk Phosphatase 38 - 126 U/L - 135(H) 144(H)  Total Bilirubin 0.3 - 1.2 mg/dL - 0.4 0.7  Bilirubin, Direct 0.1 - 0.5 mg/dL - - 0.2    IMAGING/STUDIES: CT ABD/PELVIS W/ CONTRAST 5/23: IMPRESSION: 1. Edema tracking along the right side of the gluteal cleft is  similar in appearance to the prior study. This reflected a subcutaneous abscess on the prior study. This most likely reflects a persistent small subcutaneous abscess. 2. Diffuse soft tissue edema tracking about the anorectal canal, without additional abscess. Diffuse presacral soft tissue inflammation noted. 3. Scrotal wall edema, worse on the right, with small bilateral hydroceles. 4. Mild right basilar airspace opacity raises concern for pneumonia. 5. Mild wall thickening along the distal sigmoid colon and rectum, raising concern for proctitis. 6. Enlarged azygoesophageal recess, retroperitoneal, pelvic sidewall and bilateral inguinal nodes seen, measuring up to 1.7 cm in short axis. This likely reflects the underlying infection. 7. Cholelithiasis.  Gallbladder otherwise unremarkable. CT HEAD W/O 5/29:  Abnormal low-density in the left basal ganglia which may reflect worsening infection or acute infarction. MRI BRAIN W/O 5/29:  Worsening/progression of small vessel occlusive infarctions along the base the brain bilaterally with new involvement affecting a large portion of the corpus striatum on the left. Worsening of small vessel occlusive infarctions along the superior surface of the brain an underlying white matter as well. Findings are consistent with progressive complication of meningitis. PORT CXR 6/4:  Previously reviewed by me. Endotracheal tube 3.7 cm above carina. No focal opacity. Enteric feeding tube coursing below diaphragm and side-port at gastroesophageal junction.  MICROBIOLOGY: MRSA PCR 5/23:  Negative  Blood Cultures x2 5/22:  2/2 Bottles Positive Cryptococcus neoformans  Blood Cultures x2 5/23:  2/2 Bottles Positive Cryptococcus neoformans  Wound Culture 5/23:  Multiple organisms present CSF Culture 5/24:  Abundant Cryptococcus neoformans  Quantiferon TB 5/26:  Negative  CSF Culture 5/27:  Cryptococcus neoformans  BAL RML 5/28:  Cryptococcus neoformans / AFB smear negative &  culture pending Tracheal Aspirate AFB 5/30 >>> Smear negative / Ctx pending Blood Cultures x2 5/30:  Negative  Stool Culture 5/31:  Negative  Stool Gastrointestinal Panel 5/31:  Negative Stool C diff 5/31:  Negative  CSF Culture 6/1 >>>  ANTIBIOTICS: Triumeq 5/23 - 5/24 Flagyl 5/23 - 5/24 Vancomycin 5/23 - 5/24 Doxycycline 5/24 - 5/25 Rocephin 5/23 - 5/30 Augmentin 5/24 - 5/25 Bactrim 5/23 >>> Flucytosine 5/24 >>> Valtrex 5/24 >>> Azithromycin qweek 5/24 >>> Ambisome 5/24 >>>  SIGNIFICANT EVENTS: 05/22 - Admit with perirectal abscess  ASSESSMENT/PLAN:  31 y.o. 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 Patient's ongoing encephalopathy is likely multifactorial from fentanyl and Versed administered overnight.  1. Disseminated cryptococcal infection: Continuing treatment with AmBisome and flucytosine per ID recommendations. 2. Acute hypoxic respiratory failure: Secondary to cryptococcal pneumonia. Spontaneous breathing trial with possible extubation as mental status allows. 3. Acute encephalopathy: Multifactorial with sedatives and meningoencephalitis. Discontinuing versed. Decreasing dose range of fentanyl.  4. Cryptococcal meningoencephalitis: Neurosurgery following & continuing LP drain per their recommendations. Neurology also following. 5. Perirectal abscess: Status post drainage. Currently off antibiotic therapy. 6.  Gen. herpes: Continuing acyclovir. Treatment per ID recommendations. 7. HIV/AIDS: Continuing azithromycin and Bactrim for prophylaxis. Management per ID. 8. Diarrhea: Stool culture pending. Otherwise infectious workup negative. Likely due to tube feedings. 9. 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 Speech, PT, and OT consults postextubation plan. 10. Hypomagnesemia: Resolved. 11. Hyponatremia: Mild. Continuing to trend electrolytes daily. 12. Acute renal  failure: Resolved. Continuing normal saline bolus with AmBisome. Trending electrolytes and renal function daily. 13. Anemia: Suspect secondary to anemia of chronic disease. No active bleeding. Trending cell counts daily with CBC.   Prophylaxis:  SCDs & Pepcid via tube BID. Starting Heparin [place] q8hr for DVT prophylaxis.  Diet:  NPO. Continuing tube feedings. Code Status:  Full Code per previous physician discussions.   Disposition:  Remains critically ill in the ICU. Mayl need Trach & PEG tube placement later this week if he doesn't improve. Family Update: Mother updated during rounds today.  I have spent a total of 31 minutes of critical care time today caring for the patient, updating mother at bedside, and reviewing the patient's electronic medical record.   Sonia Baller Ashok Cordia, M.D. [facility] Pager:  [phone] After 3pm or if no response, call [phone] 8:26 AM 01/03/17

## 2017-01-03 NOTE — Procedures (Signed)
Extubation Procedure Note  Patient Details:   Name: William Pena DOB: November 17, 1985 MRN: [id number]   Airway Documentation:  Airway 8 mm (Active)  Secured at (cm) 24 cm 01/03/2017 12:45 PM  Measured From Lips 01/03/2017 12:45 PM  Secured Location Left 01/03/2017 12:45 PM  Secured By [employer] 01/03/2017 12:45 PM  Tube Holder Repositioned Yes 01/03/2017 11:10 AM  Cuff Pressure (cm H2O) 26 cm H2O 01/03/2017 11:10 AM  Site Condition Dry 01/03/2017 12:45 PM    Evaluation  O2 sats: stable throughout Complications: No apparent complications Patient did tolerate procedure well. Bilateral Breath Sounds: Clear   Yes   Pt. Was extubated to a 4L [place] without any complications, dyspnea or stridor noted. Pt. Was instructed on IS x 5, highest goal achieved was 781mL.   Sidney Silberman L 01/03/2017, 1:50 PM

## 2017-01-03 NOTE — Progress Notes (Signed)
Pt awake, alert, FC. LD in place, draining ~20cc Q2 hrs.  - Will decreased drainage to 10cc Q2 hrs. If stable clinically, will likely clamp drain tomorrow

## 2017-01-03 NOTE — Progress Notes (Signed)
[facility] Progress Note Patient Name: William Pena DOB: Feb 17, 1986 MRN: [id number]   Date of Service  01/03/2017  HPI/Events of Note  Patient c/o pain. Video assessment - patient in no distress. NPO and no gastric tube. AST and ALT are normal.   eICU Interventions  Will order: 1. Tylenol Suppository 650 mg PR Q 6 hours PRN pain.     Intervention Category Intermediate Interventions: Pain - evaluation and management  Sommer,Steven Eugene 01/03/2017, 7:58 PM

## 2017-01-03 NOTE — Progress Notes (Signed)
Neurology Progress Note  Subjective: No major 24 hour events. He remains intubated with some sedation with fentanyl and Versed, weaning underway. Overall he continues to be more alert and is able to follow some simple commands on an intermittent basis.   Medications reviewed and reconciled.   Pertinent meds: Amphotericin B 270 mg daily Azithromycin 1200 mg weekly Flucytosine 1500 mg every 6 hours Bactrim 10 mL daily Fentanyl PRN   Current Meds:   Current Facility-Administered Medications:  .  0.9 %  sodium chloride infusion, , Intravenous, Continuous, Elgergawy, Silver Huguenin, MD, Last Rate: 75 mL/hr at 01/03/17 0951 .  amphotericin B liposome (AMBISOME) 270 mg in dextrose 5 % 500 mL IVPB, 4 mg/kg, Intravenous, Q24H, Ricka Burdock, RPH, Stopped at 01/02/17 1430 .  azithromycin (ZITHROMAX) 200 MG/5ML suspension 1,200 mg, 1,200 mg, Per Tube, Q Jiles Garter, Elisabeth Cara, MD, 1,200 mg at 12/30/16 0955 .  chlorhexidine gluconate (MEDLINE KIT) (PERIDEX) 0.12 % solution 15 mL, 15 mL, Mouth Rinse, BID, Halford Chessman, Vineet, MD, 15 mL at 01/03/17 0754 .  [DISCONTINUED] diphenhydrAMINE (BENADRYL) capsule 25 mg, 25 mg, Oral, Q6H PRN, 25 mg at 12/24/16 0315 **OR** diphenhydrAMINE (BENADRYL) injection 25 mg, 25 mg, Intravenous, Q6H PRN, Rayburn, Kelly A, PA-C, 25 mg at 12/22/16 2229 .  docusate (COLACE) 50 MG/5ML liquid 100 mg, 100 mg, Per Tube, BID PRN, Chesley Mires, MD .  famotidine (PEPCID) 40 MG/5ML suspension 20 mg, 20 mg, Per Tube, BID, Javier Glazier, MD, 20 mg at 01/03/17 0951 .  feeding supplement (VITAL AF 1.2 CAL) liquid 1,000 mL, 1,000 mL, Per Tube, Continuous, Sood, Vineet, MD, Last Rate: 70 mL/hr at 01/03/17 0800, 1,000 mL at 01/03/17 0800 .  fentaNYL (SUBLIMAZE) injection 25-50 mcg, 25-50 mcg, Intravenous, Q1H PRN, Javier Glazier, MD .  flucytosine (ANCOBON) capsule 1,500 mg, 1,500 mg, Per Tube, Q6H, Chesley Mires, MD, 1,500 mg at 01/03/17 0558 .  heparin injection 5,000 Units, 5,000 Units,  Subcutaneous, Q8H, Javier Glazier, MD, 5,000 Units at 01/03/17 [phone] .  lidocaine (PF) (XYLOCAINE) 1 % injection 5 mL, 5 mL, Other, Once, Dixon, [employer], NP .  MEDLINE mouth rinse, 15 mL, Mouth Rinse, 10 times per day, Chesley Mires, MD, 15 mL at 01/03/17 0958 .  [DISCONTINUED] ondansetron (ZOFRAN-ODT) disintegrating tablet 4 mg, 4 mg, Oral, Q6H PRN **OR** ondansetron (ZOFRAN) injection 4 mg, 4 mg, Intravenous, Q6H PRN, Rayburn, Kelly A, PA-C, 4 mg at 12/22/16 1939 .  polyethylene glycol (MIRALAX / GLYCOLAX) packet 17 g, 17 g, Oral, Daily PRN, Rayburn, Kelly A, PA-C .  sodium chloride 0.9 % bolus 500 mL, 500 mL, Intravenous, Q24H, Nestor, Sonia Baller, MD, Last Rate: 500 mL/hr at 01/03/17 0930, 500 mL at 01/03/17 0930 .  sodium chloride 0.9 % bolus 500 mL, 500 mL, Intravenous, Q24H, Nestor, Sonia Baller, MD, Stopped at 01/02/17 1732 .  sulfamethoxazole-trimethoprim (BACTRIM,SEPTRA) 200-40 MG/5ML suspension 10 mL, 10 mL, Per Tube, Daily, Dixon, Melton Krebs, NP, 10 mL at 01/03/17 0951  Objective:  Temp:  [97.5 F (36.4 C)-98.1 F (36.7 C)] 98.1 F (36.7 C) (06/05 0800) Pulse Rate:  [49-84] 66 (06/05 1000) Resp:  [11-19] 14 (06/05 1000) BP: (98-136)/(60-81) 118/60 (06/05 1000) SpO2:  [100 %] 100 % (06/05 1000) FiO2 (%):  [30 %] 30 % (06/05 0800) Weight:  [61.4 kg (135 lb 5.8 oz)] 61.4 kg (135 lb 5.8 oz) (06/05 0500)  General: WDWN AA man lying in ICU bed. [name] intubated and appears mildly sedated after getting fentanyl about two  hours prior to my arrival. He opens his eyes to voice and will fix and track. He will look from one side to the other to attend to the person speaking to him. He wiggled the toes of both feet to command for me this morning.  HEENT: Neck is supple without lymphadenopathy. ETT and OGT in place. Sclerae are anicteric. There is no conjunctival injection.  CV: Regular, no murmur. Carotid pulses are 2+ and symmetric with no bruits. Distal pulses 2+ and symmetric.  Lungs:  CTAB on anterior exam. Ventilated.  Extremities: No C/C/E. Numerous tattoos noted over body and face.  Neuro: MS: As noted above.  CN: Pupils are equal and reactive from 3-->2 mm bilaterally. He blinks to visual threat. Eyes are conjugate with no forced deviation and no nystagmus. Volitional horizontal saccades appear intact. Oculocephalics intact. Corneals are present. His face appears grossly symmetric. The remainder of his cranial nerve exam is limited as he is unable to participate.  Motor: Normal bulk. Tone is reduced diffusely. He does not participate with confrontational strength testing. Some spontaneous movements of BUE is noted. No tremor or other abnormal movements are observed.  Sensation: Flexion withdrawal noted to nailbed pressure x4.  DTRs: 3+, symmetric. Toes are mute bilaterally.  Coordination/gait: These cannot be assessed as he does not participate with the exam.   Labs: Lab Results  Component Value Date   WBC 5.6 01/03/2017   HGB 7.9 (L) 01/03/2017   HCT 24.6 (L) 01/03/2017   PLT 257 01/03/2017   GLUCOSE 90 01/03/2017   TRIG 144 12/26/2016   ALT 42 12/28/2016   AST 30 12/28/2016   NA 133 (L) 01/03/2017   K 4.6 01/03/2017   CL 104 01/03/2017   CREATININE 0.91 01/03/2017   BUN 20 01/03/2017   CO2 23 01/03/2017   INR 1.20 12/22/2016   CBC Latest Ref Rng & Units 01/03/2017 01/02/2017 01/01/2017  WBC 4.0 - 10.5 K/uL 5.6 8.1 6.7  Hemoglobin 13.0 - 17.0 g/dL 7.9(L) 8.4(L) 8.0(L)  Hematocrit 39.0 - 52.0 % 24.6(L) 26.0(L) 25.6(L)  Platelets 150 - 400 K/uL 257 287 239    No results found for: HGBA1C Lab Results  Component Value Date   ALT 42 12/28/2016   AST 30 12/28/2016   ALKPHOS 135 (H) 12/28/2016   BILITOT 0.4 12/28/2016   RPR nonreactive HIV viral load 1,550,000 CD4 count 21   From 12/22/16: CSF WBC 4 CSF RBC 106 CSF glucose 44 CSF protein 72 Cryptococcal antigen positive with titer greater than or equal to 2560 CSF culture positive for cryptococcus  neoformans  From 12/25/16: CSF WBC 29, 75% neutrophils CSF RBCs 9 CSF glucose 45 CSF protein 122 Cryptococcal antigen positive with titer greater than 2560 CSF culture positive for cryptococcus neoformans  From 12/30/16: CSF WBC 163, 98% mononuclear CSF RBC 2900 CSF glucose and protein not measured Cryptococcal antigen positive with titer 1280 CSF culture no growth to date  Radiology:  There is no new neuroimaging for review.   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 Responding well to amphotericin B and flucytosine with negative CSF culture on 6/1 and declining CSF crypto antigen titers suggesting clearance of the infection. Will defer to ID regarding duration of treatment given widespread systemic involvement. Appreciate neurosurgery assistance with lumbar drain, wean per their recommendations.   2. Acute ischemic stroke: This is acute, due to small vessel involvement from his cryptococcal  infection. Continue with antifungal therapy as above. No role for antiplatelet agents or statins in this setting.  3. Acute encephalopathy: This is due to cryptococcal meningitis and acute strokes with additional contributions likely from metabolic derangements, medication effect, and respiratory failure. [name] more alert this morning with less sedation. Hopefully this will allow him to wean from the ventilator soon. Continue supportive care. Continue to optimize metabolic status as you are. Continue to treat underlying infections as you are. Minimize sedation as tolerated. Avoid anything with strong anticholinergic properties that this is likely to worsen his encephalopathy.  4. 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 CD4 count this admission is 21 with high viral load greater than 1 million. Currently not receiving antiretroviral therapy in the setting of cryptococcal treatment. Appreciate ID  assistance.  This was discussed with the patient's mother at the bedside. Education was provided on the diagnosis and expected evaluation and treatment. She is in agreement with the plan as noted. She was given the opportunity to ask any questions and these were addressed to her satisfaction.    Melba Coon, MD [facility]

## 2017-01-03 NOTE — Progress Notes (Signed)
[facility] for Infectious Disease    Date of Admission:  12/20/2016   Total days of antibiotics 14 Day 14L-ampho/flucytosine   ID: William Pena is a 31 y.o. male with advanced hiv disease, CD 4 count of 21/VL1.66M with disseminated Cryptococcal disease including meningitis, bacteremia, and likely pneumonia.   Principal Problem:   Perirectal abscess Active Problems:   Chronic pain   AIDS (acquired immune deficiency syndrome) (HCC)   Hyponatremia   Normocytic anemia   Elevated LFTs   Pulmonary infiltrates   Hypomagnesemia   Encephalopathy acute   SIRS (systemic inflammatory response syndrome) (HCC)   Acute respiratory failure (HCC)   Rectal abscess   Cryptococcal meningoencephalitis (HCC)   Cryptococcosis (HCC)   Cavitary pneumonia   Diffuse lymphadenopathy   Drug rash   Elevated intracranial pressure   HIV disease (HCC)   Lymphadenopathy of head and neck    Subjective: Awake but drowsy on ventilator. Mother is at the bedside. Attempting to communicate today by blinking his eyes.   Medications:  . azithromycin  1,200 mg Per Tube Q Fri  . chlorhexidine gluconate (MEDLINE KIT)  15 mL Mouth Rinse BID  . famotidine  20 mg Per Tube BID  . flucytosine  1,500 mg Per Tube Q6H  . heparin subcutaneous  5,000 Units Subcutaneous Q8H  . lidocaine (PF)  5 mL Other Once  . mouth rinse  15 mL Mouth Rinse 10 times per day  . sulfamethoxazole-trimethoprim  10 mL Per Tube Daily    Objective: Vital signs in last 24 hours: Temp:  [97.5 F (36.4 C)-98.1 F (36.7 C)] 98.1 F (36.7 C) (06/05 0800) Pulse Rate:  [49-84] 66 (06/05 1000) Resp:  [11-19] 14 (06/05 1000) BP: (98-136)/(60-81) 118/60 (06/05 1000) SpO2:  [100 %] 100 % (06/05 1000) FiO2 (%):  [30 %] 30 % (06/05 0800) Weight:  [135 lb 5.8 oz (61.4 kg)] 135 lb 5.8 oz (61.4 kg) (06/05 0500)  General appearance: alert but drowsy at  times, no distress and calm lying in bed.  Eyes: conjunctivae/corneas clear. PERRL, EOM's intact.  Neck: no carotid bruit, no JVD, supple, symmetrical, trachea midline and improved lymphadenopathy. No nuchal rigidity with passive neck ROM Resp: Clear to auscultation Cardio: regular rate and rhythm, S1, S2 normal, no murmur, click, rub or gallop Male genitalia: lesions still in place. Appear more pearl shaped.  Extremities: extremities normal, atraumatic, no cyanosis or edema Skin: color, texture, turgor normal. No rashes or lesions Lymph nodes: Overall improvement in lymphadenopathy  Neurologic: Alert and opens eyes spontaneously. Tracking with movement across the bed. Following some commands for me today. Moves all extremities when asked although weak.    Lab Results  Recent Labs  01/02/17 0210 01/03/17 0221  WBC 8.1 5.6  HGB 8.4* 7.9*  HCT 26.0* 24.6*  NA 134* 133*  K 4.6 4.6  CL 108 104  CO2 20* 23  BUN 19 20  CREATININE 0.84 0.91   Liver Panel  Recent Labs  01/03/17 0221  ALBUMIN 2.6*   Sedimentation Rate No results for input(s): ESRSEDRATE in the last 72 hours. C-Reactive Protein No results for input(s): CRP in the last 72 hours.  Microbiology: 6/1 CSF >> cx pending     >> Fungal elements on gram stain.      >> WBC 163 with 92% lymphocytic predominance, also 2,900 RBC 5/30 >> blood cx NG  5/28 trach aspirate Cx >> Cryptococcus neoformans   Studies/Results: Dg Chest Port 1 View  Result Date:  01/02/2017 CLINICAL DATA:  Hypoxia EXAM: PORTABLE CHEST 1 VIEW COMPARISON:  January 01, 2017 FINDINGS: Endotracheal tube tip is 3.0 cm above the carina. Nasogastric tube side port is at the gastroesophageal junction with the nasogastric tube tip below the diaphragm and not seen. There is no pneumothorax. There is no edema or consolidation. Heart is mildly enlarged with pulmonary vascularity within normal limits. No adenopathy. No evident bone lesions. IMPRESSION: Tube positions as  described without pneumothorax. Nasogastric tube side port is at the gastroesophageal junction. Advise advancing nasogastric tube approximately 5 cm. No edema or consolidation. Stable cardiac prominence. Electronically Signed   By: Lowella Grip III M.D.   On: 01/02/2017 07:19    Assessment/Plan:  CM c/b small vessel infarcts =  - Today is day 14 of L-ampho and flucytosine - will d/w Dr. [name] regarding increasing duration of induction treatment vs initiation of consolidation treatment with Fluconazole.  - Lumbar drain in place still; Straw colored.  - CSF Cx 12/30/16 - encapsulated yeast observed on GS, NG on culture  - Continually needing mag replenishment  - Neuro status continues to improve  HIV Disease = - tx deferred due to CM treatment - OI proph - continue with bactrim ss daily + Azith weekly   Perirectal abscess=  - received a short course of abtx after debridement - continue with wound care  Pneumonia, Cryptococcal =  - 5/28 BAL Cx >> Cryptococcal Neoformans confirmed  - mtB PCR and AFB negative - CXR images reviewed over the last few days and decreased lung opacities and improved aeration noted - Weaning daily on ventilator, appreciate CCM care. Hopefully he will be a candidate for extubation soon.   Lymphadenopathy =  - Deferred biopsy at this time - Resolving - low threshold to biopsy should fevers return   HSV genital lesions =  - Last day of treatment today - Overall appear about the same but more pearlescent today  Updated mother with the above plan.   William Madeira, MSN, NP-C [facility] [place] Cell: [phone] Pager: [phone]  01/03/2017, 10:59 AM

## 2017-01-03 NOTE — Care Management (Signed)
CM contacted financial counselor and requested consult as pt is no longer a generic inmate

## 2017-01-03 NOTE — Progress Notes (Signed)
Pharmacy Antibiotic Note  William Pena is a 31 y.o. 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  Pharmacy has been consulted for liposomal amphotericin b dosing. Patient remains slow to respond to therapy, after discussion with Dr. Baxter Flattery Ambisome increased to 4 mg/kg (based on admission weight, 65 kg). Since admission patient has gained 7 kg, attributed to large volume of fluid he has received. Renal function remains stable  Plan: Continue Ambisome to 270 mg (~4mg /kg using admission weight) IV q24h Continue flucytosine 1500 mg PO Q6h Continue pre and post 500 mL sodium chloride boluses Monitor renal function, electrolytes and response to therapy  Height: 5\' 6"  (167.6 cm) Weight: 135 lb 5.8 oz (61.4 kg) IBW/kg (Calculated) : 63.8  Temp (24hrs), Avg:97.9 F (36.6 C), Min:97.5 F (36.4 C), Max:98.1 F (36.7 C)   Recent Labs Lab 12/28/16 0512 12/29/16 0304 12/30/16 0340 12/31/16 0326 01/01/17 0249 01/02/17 0210 01/03/17 0221  WBC 10.1 6.7  --   --  6.7 8.1 5.6  CREATININE 1.25* 1.14 0.99 0.88 0.84 0.84 0.91    Estimated Creatinine Clearance: 102.1 mL/min (by C-G formula based on SCr of 0.91 mg/dL).    Allergies  Allergen Reactions  . 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  Salome Arnt, PharmD, BCPS 01/03/2017 8:09 AM

## 2017-01-03 NOTE — Progress Notes (Signed)
Nutrition Follow-up  INTERVENTION:   If pt unable to swallow post extubation recommend: Jevity 1.2 @ 60 ml/hr (1440 ml/day) 30 ml Prostat BID Provides: 1928 kcal, 110 grams protein, and 1166 ml free water.   Monitor for diet advancement and supplement diet as appropriate.   NUTRITION DIAGNOSIS:   Inadequate oral intake related to inability to eat as evidenced by NPO status. Ongoing.   GOAL:   Patient will meet greater than or equal to 90% of their needs Not met.   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  He was treated at [facility] with surgery in January by Dr. Ronita Hipps, but now is incarcerated in [place].  He has had multiple different types of drains, but currently just has a draining seton in place at the site of a fistula.  He has been to the ED 3 times in the last week.  Due to his current incarceration in [facility], he cannot be transferred to [place].    6/5 extubated, therapy consults pending.  Noted loose stools likely result of multiple antiobiotics  Labs reviewed: PO4 5.4 (H) CBG's: 102-127-98  Diet Order:  Diet NPO time specified  Skin:   (incision perineum and back, open buttocks wound)  Last BM:  6/3  Height:   Ht Readings from Last 1 Encounters:  12/27/16 '5\' 6"'  (1.676 m)    Weight:   Wt Readings from Last 1 Encounters:  01/03/17 135 lb 5.8 oz (61.4 kg)    Ideal Body Weight:  59 kg  BMI:  Body mass index is 21.85 kg/m.  Estimated Nutritional Needs:   Kcal:  1800-2000  Protein:  90-110 grams  Fluid:  > 1.8 L/day  EDUCATION NEEDS:   No education needs identified at this time  [place], [place], [place] Pager [phone] After Hours Pager

## 2017-01-04 DIAGNOSIS — G039 Meningitis, unspecified: Secondary | ICD-10-CM

## 2017-01-04 DIAGNOSIS — B20 Human immunodeficiency virus [HIV] disease: Secondary | ICD-10-CM

## 2017-01-04 DIAGNOSIS — I749 Embolism and thrombosis of unspecified artery: Secondary | ICD-10-CM

## 2017-01-04 DIAGNOSIS — D62 Acute posthemorrhagic anemia: Secondary | ICD-10-CM

## 2017-01-04 LAB — CBC WITH DIFFERENTIAL/PLATELET
Basophils Absolute: 0.1 10*3/uL (ref 0.0–0.1)
Basophils Relative: 1 %
EOS ABS: 0 10*3/uL (ref 0.0–0.7)
EOS PCT: 1 %
HCT: 28.1 % — ABNORMAL LOW (ref 39.0–52.0)
Hemoglobin: 9 g/dL — ABNORMAL LOW (ref 13.0–17.0)
LYMPHS ABS: 1 10*3/uL (ref 0.7–4.0)
Lymphocytes Relative: 24 %
MCH: 27.1 pg (ref 26.0–34.0)
MCHC: 32 g/dL (ref 30.0–36.0)
MCV: 84.6 fL (ref 78.0–100.0)
MONO ABS: 0.6 10*3/uL (ref 0.1–1.0)
Monocytes Relative: 14 %
Neutro Abs: 2.6 10*3/uL (ref 1.7–7.7)
Neutrophils Relative %: 60 %
PLATELETS: 275 10*3/uL (ref 150–400)
RBC: 3.32 MIL/uL — ABNORMAL LOW (ref 4.22–5.81)
RDW: 17.2 % — AB (ref 11.5–15.5)
WBC: 4.3 10*3/uL (ref 4.0–10.5)

## 2017-01-04 LAB — RENAL FUNCTION PANEL
Albumin: 2.8 g/dL — ABNORMAL LOW (ref 3.5–5.0)
Anion gap: 8 (ref 5–15)
BUN: 20 mg/dL (ref 6–20)
CHLORIDE: 103 mmol/L (ref 101–111)
CO2: 23 mmol/L (ref 22–32)
CREATININE: 1.02 mg/dL (ref 0.61–1.24)
Calcium: 9.3 mg/dL (ref 8.9–10.3)
GFR calc Af Amer: >60 mL/min (ref >60)
GFR calc non Af Amer: >60 mL/min (ref >60)
GLUCOSE: 85 mg/dL (ref 65–99)
Phosphorus: 5.7 mg/dL — ABNORMAL HIGH (ref 2.5–4.6)
Potassium: 4.5 mmol/L (ref 3.5–5.1)
Sodium: 134 mmol/L — ABNORMAL LOW (ref 135–145)

## 2017-01-04 LAB — GLUCOSE, CAPILLARY
GLUCOSE-CAPILLARY: 92 mg/dL (ref 65–99)
GLUCOSE-CAPILLARY: 98 mg/dL (ref 65–99)
Glucose-Capillary: 84 mg/dL (ref 65–99)
Glucose-Capillary: 87 mg/dL (ref 65–99)
Glucose-Capillary: 81 mg/dL (ref 65–99)

## 2017-01-04 LAB — MAGNESIUM: MAGNESIUM: 1.6 mg/dL — AB (ref 1.7–2.4)

## 2017-01-04 MED ORDER — TRAMADOL HCL 50 MG PO TABS
50.0000 mg | ORAL_TABLET | Freq: Four times a day (QID) | ORAL | Status: DC | PRN
  Administered 2017-01-04 – 2017-01-05 (×4): 50 mg via ORAL
  Filled 2017-01-04 (×4): qty 1

## 2017-01-04 MED ORDER — FAMOTIDINE 20 MG PO TABS
20.0000 mg | ORAL_TABLET | Freq: Every day | ORAL | Status: DC
  Administered 2017-01-05 – 2017-01-08 (×4): 20 mg via ORAL
  Filled 2017-01-04 (×4): qty 1

## 2017-01-04 MED ORDER — MAGNESIUM SULFATE 4 GM/100ML IV SOLN
4.0000 g | Freq: Once | INTRAVENOUS | Status: AC
  Administered 2017-01-04: 4 g via INTRAVENOUS
  Filled 2017-01-04: qty 100

## 2017-01-04 MED ORDER — AZITHROMYCIN 600 MG PO TABS
1200.0000 mg | ORAL_TABLET | ORAL | Status: DC
  Administered 2017-01-06 – 2017-02-03 (×5): 1200 mg via ORAL
  Filled 2017-01-04 (×5): qty 2

## 2017-01-04 MED ORDER — DOCUSATE SODIUM 100 MG PO CAPS
100.0000 mg | ORAL_CAPSULE | Freq: Two times a day (BID) | ORAL | Status: DC | PRN
  Administered 2017-01-14 – 2017-01-15 (×2): 100 mg via ORAL
  Filled 2017-01-04 (×4): qty 1

## 2017-01-04 NOTE — Progress Notes (Signed)
[facility] Attending Note  ADMISSION DATE:  12/20/2016  CONSULTATION DATE:  12/22/2016  REFERRING MD :  Carlyle Basques, M.D.  CHIEF COMPLAINT:  Disseminated cryptococcus  Presenting HPI:  31 y.o. male with HIV/AIDS (last CD4 1 w/ VL 81K). Just started back on ART 2 months ago however he was incarcerated and reported he was not given his medications. He was admitted on 5/22 for perirectal abscess. During admission he was also noted to have HA, fever, chills, night sweats neck pain and LAN. ID was consulted and recommended imaging of chest, LP, and cultures be sent from perirectal I&D. Since that time CT chest results: "Bilateral airspace disease with a small cavitary lesion in the right upper lobe. Findings compatible with pneumonia.Trace right pleural fluid versus right pleural thickening". Also his serum cryptococcal antigenemia titer was elevated raising the possibility of disseminated cryptococcal infection. Including lung involvement. We have been asked to see by infectious disease for bronchoscopy and washings in effort to help determine if CT changes reflect mTB OR possibly pulmonary cryptococcal disease. Initially unable to perform initial bronchoscopy due to hypoxia. 5/28 called by primary for altered LOC. He became very lethargic/obtunded requiring CT and LP, but airway was an issue so PCCM asked to see for ICU transfer and intubation.  Subjective:   Seen by rehab--> felt to be appropriate for CIR     Temp:  [97.2 F (36.2 C)-98.7 F (37.1 C)] 98.5 F (36.9 C) (06/07 0400) Pulse Rate:  [55-87] 56 (06/07 0500) Resp:  [10-21] 11 (06/07 0500) BP: (97-150)/(59-101) 100/70 (06/07 0500) SpO2:  [96 %-100 %] 99 % (06/07 0500) Weight:  [134 lb 4.2 oz (60.9 kg)] 134 lb 4.2 oz (60.9 kg) (06/07 0500)   Intake/Output Summary (Last 24 hours) at 01/05/17 0614 Last data filed at 01/05/17 0500  Gross per 24 hour  Intake             3325 ml  Output             4169 ml  Net              -844 ml    General appearance: 31 Year old  Male  NAD, confused at times  conversant at times Eyes: anicteric sclerae, moist conjunctivae; PERRL, EOMI bilaterally. Mouth:  membranes and no mucosal ulcerations; Neck: Trachea midline; neck supple, no JVD Lungs/chest: CTA, with normal respiratory effort and no intercostal retractions CV: RRR, no MRGs  Abdomen: Soft, non-tender; no masses or HSM Extremities: No peripheral edema or extremity lymphadenopathy Skin: Normal temperature, turgor and texture; no rash, ulcers or subcutaneous nodules Neuro/Psych: intermittently alert and oriented to person, place and time, generalized weakness. LP drain w/ clear CSF  CBC Recent Labs     01/03/17  0221  01/04/17  0336  01/05/17  0233  WBC  5.6  4.3  2.8*  HGB  7.9*  9.0*  8.6*  HCT  24.6*  28.1*  27.1*  PLT  257  275  255    Coag's No results for input(s): APTT, INR in the last 72 hours.  BMET Recent Labs     01/03/17  0221  01/04/17  0336  01/05/17  0233  NA  133*  134*  130*  K  4.6  4.5  4.2  CL  104  103  101  CO2  _0 BUN  _1 CREATININE  0.91  1.02  0.94  GLUCOSE  90  85  89    Electrolytes Recent Labs     01/03/17  0221  01/04/17  0336  01/05/17  0233  CALCIUM  9.1  9.3  9.3  MG  2.0  1.6*  1.9  PHOS  5.4*  5.7*  5.6*    Sepsis Markers No results for input(s): PROCALCITON, O2SATVEN in the last 72 hours.  Invalid input(s): LACTICACIDVEN  ABG No results for input(s): PHART, PCO2ART, PO2ART in the last 72 hours.  Liver Enzymes Recent Labs     01/03/17  0221  01/04/17  0336  01/05/17  0233  ALBUMIN  2.6*  2.8*  2.6*    Cardiac Enzymes No results for input(s): TROPONINI, PROBNP in the last 72 hours.  Glucose Recent Labs     01/03/17  2344  01/04/17  0350  01/04/17  0926  01/04/17  1147  01/04/17  1556  01/04/17  2026  GLUCAP  89  84  87  92  98  81    Imaging No results found.   LINES/TUBES: OETT 8.0 5/28 -  6/5 OGT 5/28 - 6/5 Lumbar Drain 5/28 >>> Foley 5/28 >>> PIV   IMAGING/STUDIES: CT ABD/PELVIS W/ CONTRAST 5/23: 1. Edema tracking along the right side of the gluteal cleft is similar in appearance to the prior study. This reflected a subcutaneous abscess on the prior study. This most likely reflects a persistent small subcutaneous abscess. 2. Diffuse soft tissue edema tracking about the anorectal canal, without additional abscess. Diffuse presacral soft tissue inflammation noted. 3. Scrotal wall edema, worse on the right, with small bilateral hydroceles. 4. Mild right basilar airspace opacity raises concern for pneumonia. 5. Mild wall thickening along the distal sigmoid colon and rectum, raising concern for proctitis. 6. Enlarged azygoesophageal recess, retroperitoneal, pelvic sidewall and bilateral inguinal nodes seen, measuring up to 1.7 cm in short axis. This likely reflects the underlying infection. 7. Cholelithiasis.  Gallbladder otherwise unremarkable. CT HEAD W/O 5/29:  Abnormal low-density in the left basal ganglia which may reflect worsening infection or acute infarction. MRI BRAIN W/O 5/29:  Worsening/progression of small vessel occlusive infarctions along the base the brain bilaterally with new involvement affecting a large portion of the corpus striatum on the left. Worsening of small vessel occlusive infarctions along the superior surface of the brain an underlying white matter as well. Findings are consistent with progressive complication of meningitis.   MICROBIOLOGY: MRSA PCR 5/23:  Negative  Blood Cultures x2 5/22:  2/2 Bottles Positive Cryptococcus neoformans  Blood Cultures x2 5/23:  2/2 Bottles Positive Cryptococcus neoformans  Wound Culture 5/23:  Multiple organisms present CSF Culture 5/24:  Abundant Cryptococcus neoformans  Quantiferon TB 5/26:  Negative  CSF Culture 5/27:  Cryptococcus neoformans  BAL RML 5/28:  Cryptococcus neoformans / AFB smear negative & culture  pending Tracheal Aspirate AFB 5/30 >>> Smear negative / Ctx pending Blood Cultures x2 5/30:  Negative  Stool Culture 5/31:  Negative  Stool Gastrointestinal Panel 5/31:  Negative Stool C diff 5/31:  Negative  CSF Culture 6/1 >>> Encapsulated yeast seen  ANTIBIOTICS: Triumeq 5/23 - 5/24 Flagyl 5/23 - 5/24 Vancomycin 5/23 - 5/24 Doxycycline 5/24 - 5/25 Rocephin 5/23 - 5/30 Augmentin 5/24 - 5/25 Bactrim 5/23 >>> Flucytosine 5/24 >>> Valtrex 5/24 >>> Azithromycin qweek 5/24 >>> Ambisome 5/24 >>>  SIGNIFICANT EVENTS: 05/22 - Admit with perirectal abscess  ASSESSMENT/PLAN:   31 y.o. male with sepsis secondary to perirectal abscess and disseminated cryptococcal infection. Appreciate assistance  from neurology, neurosurgery, and infectious diseases. Encephalopathy has some waxing and waning.  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  -extubated 6/5 [place] as able  Acute encephalopathy: Likely multifactorial from delirium as well as meningoencephalitis and Basal ganglia infarct Has resultant deconditioning  -neurology following. Infarct likely a complication of the meningoencephalitis  Plan Cont supportive care Could to to CIR when LP drain is out AND L-ampho and flucytosine transitioned  Perirectal abscess: Status post drainage. Plan BID dressing change Surgical services have d/w WON  Fluid and electrolyte imbalance  -resolved AKI -hypomagnesemia  Plan Recheck and replace lytes as indicated  HIV/AIDS; Genital herpes:  Plan Completed acyclovir 6/6  Cont prophylaxis w/ bactrim and azith Further rx per ID; holding ARVs given CM infection   Diarrhea: Stool culture negative.  Infectious workup negative. Plan Trend output  Anemia: Secondary to anemia of chronic disease.  Plan Trend CBC Transfuse as indicated  Prophylaxis:  SCDs, Heparin [place] q8hr, & Pepcid via tube qhs. Diet:  NPO pending speech evaluation. May need NGT placed.  Code Status:  Full Code per previous physician discussions.   Disposition:  Patient to remain in the ICU while lumbar drain in place. PT, OT, and speech therapy consulted. Family Update: Mother again updated during rounds today.   Erick Colace ACNP-BC [place] Pager # [phone] OR # [phone] if no answer  STAFF NOTE: I, Merrie Roof, MD FACP have personally reviewed patient's available data, including medical history, events of note, physical examination and test results as part of my evaluation. I have discussed with resident/NP and other care providers such as pharmacist, RN and RRT. In addition, I personally evaluated patient and elicited key findings of: awakens, fc intermittent, talks slow delayed, O x 1, brady, CTA, abdo soft, min edema, was neg 771 cc last 24 hours, has Na 130 but reluctant to provide lasix with Ampho for now, but may need lasix soon, follow NA trend, he has mult organ failure that I am actively managing including neuro, infectious, electrolyte, hemodynamics at times bradycardia PLAN: drain clamp, then assess neurostatus, continued antifungals, following repeat cultures/ stain results, keep on monitor tele , is asymptomatic for now, may need lasix, chem in am, would favor dc foley soon, follow leukopenia as well, I have reviewed personally his, is non room, IS as able, I updated mom     Lavon Paganini. Titus Mould, MD, [place] Pgr: [facility] 01/05/2017 10:20 AM

## 2017-01-04 NOTE — Evaluation (Signed)
Physical Therapy Evaluation Patient Details Name: William Pena MRN: [id number] DOB: 03/17/1986 Today's Date: 01/04/2017   History of Present Illness  Patient is a 31 y/o male presents with sepsis secondary to perirectal abscess s/p I&D 5/23 and disseminated cryptococcal infection including meningitis, bacteremia, and likely pneumonia. Intubated 5/28-6/5. 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 Second MRI- extension of the diffusion restriction in the left basal ganglia and right basal ganglia. s/p Lumbar drain 5/30. PMH includes advanced hiv disease  Clinical Impression  Patient presents with flexor tone BUEs, overall weakness, right gaze preference, decreased initiation, impaired attention, delayed response, decreased sitting balance and impaired mobility s/p above. Pt with minimal verbalizations during session and perseverating on "ahhhh." Tolerated sitting EOB ~25 mins with total support. Did have an episode of bradycardia which RN is aware of. Able to extend UEs with lots of force. More movement noted in LLE then RLE today. Pt will need intensive therapies to maximize independence and mobility prior to return home. Has supportive mother. Will follow acutely.    Follow Up Recommendations CIR    Equipment Recommendations  Other (comment) (TBA)    Recommendations for Other Services Rehab consult     Precautions / Restrictions Precautions Precautions: Fall Restrictions Weight Bearing Restrictions: No      Mobility  Bed Mobility Overal bed mobility: Needs Assistance Bed Mobility: Sit to Sidelying;Rolling;Supine to Sit Rolling: Max assist   Supine to sit: Total assist;+2 for physical assistance   Sit to sidelying: Max assist;+2 for physical assistance;HOB elevated General bed mobility comments: Total A to get to EOB using helicoptor technique; able to assist lowering onto right elbow to return to supine but asisst with LEs  and trunk.  Transfers                    Ambulation/Gait                Stairs            Wheelchair Mobility    Modified Rankin (Stroke Patients Only) Modified Rankin (Stroke Patients Only) Pre-Morbid Rankin Score: No symptoms Modified Rankin: Severe disability     Balance Overall balance assessment: Needs assistance Sitting-balance support: Feet supported;No upper extremity supported Sitting balance-Leahy Scale: Zero Sitting balance - Comments: Requires Mod-Max A sitting EOB; sat EOB ~25 mins. Tone in UE worsened with tasks. Postural control: Posterior lean                                   Pertinent Vitals/Pain Pain Assessment: Faces Faces Pain Scale: No hurt    Home Living Family/patient expects to be discharged to:: Inpatient rehab Living Arrangements: Alone               Additional Comments: Pt recently incarcerated, but no longer and now homeless.    Prior Function Level of Independence: Independent               Hand Dominance        Extremity/Trunk Assessment   Upper Extremity Assessment Upper Extremity Assessment: Defer to OT evaluation (BUEs with increased flexor tone bilaterally. Able to break tone into extension with moderate effort and not able to maintain. Some tremoring noted in LUE>RUE sitting EOB.)    Lower Extremity Assessment Lower Extremity Assessment: LLE deficits/detail;RLE deficits/detail RLE Deficits / Details: Able to wiggle toes; No active movement seen today. RLE Sensation:  [facility])  LLE Deficits / Details: Able to wiggle toes, trace in quads with delayed response. LLE Sensation:  [facility])       Communication   Communication: Expressive difficulties (low toned voice, minimal verbalizations)  Cognition Arousal/Alertness: Awake/alert Behavior During Therapy: Flat affect Overall Cognitive Status: Impaired/Different from baseline Area of Impairment: Orientation;Attention;Following  commands;Safety/judgement;Awareness;Problem solving                 Orientation Level: Disoriented to;Time;Situation (KNows it is June but not date despite given options) Current Attention Level: Focused   Following Commands: Follows one step commands with increased time;Follows one step commands inconsistently (repetition and constant stimulus) Safety/Judgement: Decreased awareness of deficits Awareness: Intellectual Problem Solving: Slow processing;Decreased initiation;Difficulty sequencing;Requires verbal cues;Requires tactile cues General Comments: Pt perseverating on verbalizations. "[place]"  Increased time to respond to any questions asked or commands. Requires repetition and constant stimulus to stay attended to task at hand. Grins appropriately. Decreased initiation with all motor movement. Seems to have right gaze preference but will attend to left side on command with cues.       General Comments General comments (skin integrity, edema, etc.): Mother present during session. Pt with episode of bradycardia after return to supine down to 46 bpm. Lasted <1 min, pt asymptomatic. RN notified. Returned to 54s.    Exercises     Assessment/Plan    PT Assessment Patient needs continued PT services  PT Problem List Decreased strength;Decreased mobility;Decreased range of motion;Impaired tone;Decreased cognition;Cardiopulmonary status limiting activity;Decreased balance;Decreased skin integrity       PT Treatment Interventions Therapeutic activities;Therapeutic exercise;Gait training;Patient/family education;Balance training;Functional mobility training;Stair training;Cognitive remediation;DME instruction;Neuromuscular re-education;Wheelchair mobility training    PT Goals (Current goals can be found in the Care Plan section)       Frequency Min 3X/week   Barriers to discharge        Co-evaluation PT/OT/SLP Co-Evaluation/Treatment: Yes Reason for Co-Treatment: For  patient/therapist safety;To address functional/ADL transfers;Necessary to address cognition/behavior during functional activity PT goals addressed during session: Mobility/safety with mobility;Strengthening/ROM;Balance         AM-PAC PT "6 Clicks" Daily Activity  Outcome Measure Difficulty turning over in bed (including adjusting bedclothes, sheets and blankets)?: Total Difficulty moving from lying on back to sitting on the side of the bed? : Total Difficulty sitting down on and standing up from a chair with arms (e.g., wheelchair, bedside commode, etc,.)?: Total Help needed moving to and from a bed to chair (including a wheelchair)?: Total Help needed walking in hospital room?: Total Help needed climbing 3-5 steps with a railing? : Total 6 Click Score: 6    End of Session   Activity Tolerance: Patient tolerated treatment well Patient left: in bed;with call bell/phone within reach;with family/visitor present;with SCD's reapplied Nurse Communication: Mobility status;Need for lift equipment PT Visit Diagnosis: Muscle weakness (generalized) (M62.81);Other symptoms and signs involving the nervous system (R29.898);Hemiplegia and hemiparesis Hemiplegia - Right/Left: Left (both) Hemiplegia - dominant/non-dominant: Non-dominant Hemiplegia - caused by: Cerebral infarction    Time: 1050-1130 PT Time Calculation (min) (ACUTE ONLY): 40 min   Charges:   PT Evaluation $PT Eval Moderate Complexity: 1 Procedure PT Treatments $Therapeutic Activity: 8-22 mins   PT G Codes:        Wray Kearns, PT, DPT [phone]    Marguarite Arbour A Dymond Spreen 01/04/2017, 12:55 PM

## 2017-01-04 NOTE — Progress Notes (Signed)
[facility] Attending Note  ADMISSION DATE:  12/20/2016  CONSULTATION DATE:  12/22/2016  REFERRING MD :  Carlyle Basques, M.D.  CHIEF COMPLAINT:  Disseminated cryptococcus  Presenting HPI:  31 y.o. male with HIV/AIDS (last CD4 1 w/ VL 81K). Just started back on ART 2 months ago however he was incarcerated and reported he was not given his medications. He was admitted on 5/22 for perirectal abscess. During admission he was also noted to have HA, fever, chills, night sweats neck pain and LAN. ID was consulted and recommended imaging of chest, LP, and cultures be sent from perirectal I&D. Since that time CT chest results: "Bilateral airspace disease with a small cavitary lesion in the right upper lobe. Findings compatible with pneumonia.Trace right pleural fluid versus right pleural thickening". Also his serum cryptococcal antigenemia titer was elevated raising the possibility of disseminated cryptococcal infection. Including lung involvement. We have been asked to see by infectious disease for bronchoscopy and washings in effort to help determine if CT changes reflect mTB OR possibly pulmonary cryptococcal disease. Initially unable to perform initial bronchoscopy due to hypoxia. 5/28 called by primary for altered LOC. He became very lethargic/obtunded requiring CT and LP, but airway was an issue so PCCM asked to see for ICU transfer and intubation.  Subjective:  Couple episodes of bradycardia overnight. Patient successfully extubated yesterday. Nurse reports the patient does have some varying responses to questioning with unclear level of confusion.  Review of Systems:  Unable to obtain given altered mentation.  Vent Mode: PSV;CPAP FiO2 (%):  [30 %] 30 % PEEP:  [5 cmH20] 5 cmH20 Pressure Support:  [0 cmH20-8 cmH20] 0 cmH20  Temp:  [98 F (36.7 C)-98.9 F (37.2 C)] 98 F (36.7 C) (06/06 0800) Pulse Rate:  [41-92] 65 (06/06 0800) Resp:  [11-23] 18 (06/06 0800) BP:  (108-148)/(53-103) 122/77 (06/06 0800) SpO2:  [100 %] 100 % (06/06 0800) FiO2 (%):  [30 %] 30 % (06/05 1245)   General:  Eyes closed. No distress. Mother at bedside.  Integument:  Warm & dry. No rash on exposed skin. No bruising. Multiple tattoos noted. Extremities:  No cyanosis or clubbing.  HEENT:  Moist mucus membranes. No scleral injection or icterus. Pupils symmetric. Cardiovascular:  Regular rhythm. No edema. No appreciable JVD.  Pulmonary:  Good aeration & clear to auscultation bilaterally. Normal work of breathing on nasal cannula oxygen. Abdomen: Soft. Normal bowel sounds. Nondistended. Neurological: Seems to attend to voice. Does not follow commands or answer questions for me. Does respond to tactile stimulus and extremities. Grimacing intermittently.  LINES/TUBES: OETT 8.0 5/28 - 6/5 OGT 5/28 - 6/5 Lumbar Drain 5/28 >>> Foley 5/28 >>> PIV  CBC Latest Ref Rng & Units 01/04/2017 01/03/2017 01/02/2017  WBC 4.0 - 10.5 K/uL 4.3 5.6 8.1  Hemoglobin 13.0 - 17.0 g/dL 9.0(L) 7.9(L) 8.4(L)  Hematocrit 39.0 - 52.0 % 28.1(L) 24.6(L) 26.0(L)  Platelets 150 - 400 K/uL 275 257 287    BMP Latest Ref Rng & Units 01/04/2017 01/03/2017 01/02/2017  Glucose 65 - 99 mg/dL 85 90 99  BUN 6 - 20 mg/dL _0 Creatinine 0.61 - 1.24 mg/dL 1.02 0.91 0.84  Sodium 135 - 145 mmol/L 134(L) 133(L) 134(L)  Potassium 3.5 - 5.1 mmol/L 4.5 4.6 4.6  Chloride 101 - 111 mmol/L 103 104 108  CO2 22 - 32 mmol/L 23 23 20(L)  Calcium 8.9 - 10.3 mg/dL 9.3 9.1 9.2    Hepatic Function Latest Ref Rng & Units  01/04/2017 01/03/2017 12/28/2016  Total Protein 6.5 - 8.1 g/dL - - 8.3(H)  Albumin 3.5 - 5.0 g/dL 2.8(L) 2.6(L) 2.2(L)  AST 15 - 41 U/L - - 30  ALT 17 - 63 U/L - - 42  Alk Phosphatase 38 - 126 U/L - - 135(H)  Total Bilirubin 0.3 - 1.2 mg/dL - - 0.4  Bilirubin, Direct 0.1 - 0.5 mg/dL - - -    IMAGING/STUDIES: CT ABD/PELVIS W/ CONTRAST 5/23: IMPRESSION: 1. Edema tracking along the right side of the gluteal cleft is  similar in appearance to the prior study. This reflected a subcutaneous abscess on the prior study. This most likely reflects a persistent small subcutaneous abscess. 2. Diffuse soft tissue edema tracking about the anorectal canal, without additional abscess. Diffuse presacral soft tissue inflammation noted. 3. Scrotal wall edema, worse on the right, with small bilateral hydroceles. 4. Mild right basilar airspace opacity raises concern for pneumonia. 5. Mild wall thickening along the distal sigmoid colon and rectum, raising concern for proctitis. 6. Enlarged azygoesophageal recess, retroperitoneal, pelvic sidewall and bilateral inguinal nodes seen, measuring up to 1.7 cm in short axis. This likely reflects the underlying infection. 7. Cholelithiasis.  Gallbladder otherwise unremarkable. CT HEAD W/O 5/29:  Abnormal low-density in the left basal ganglia which may reflect worsening infection or acute infarction. MRI BRAIN W/O 5/29:  Worsening/progression of small vessel occlusive infarctions along the base the brain bilaterally with new involvement affecting a large portion of the corpus striatum on the left. Worsening of small vessel occlusive infarctions along the superior surface of the brain an underlying white matter as well. Findings are consistent with progressive complication of meningitis. PORT CXR 6/4:  Previously reviewed by me. Endotracheal tube 3.7 cm above carina. No focal opacity. Enteric feeding tube coursing below diaphragm and side-port at gastroesophageal junction.  MICROBIOLOGY: MRSA PCR 5/23:  Negative  Blood Cultures x2 5/22:  2/2 Bottles Positive Cryptococcus neoformans  Blood Cultures x2 5/23:  2/2 Bottles Positive Cryptococcus neoformans  Wound Culture 5/23:  Multiple organisms present CSF Culture 5/24:  Abundant Cryptococcus neoformans  Quantiferon TB 5/26:  Negative  CSF Culture 5/27:  Cryptococcus neoformans  BAL RML 5/28:  Cryptococcus neoformans / AFB smear negative &  culture pending Tracheal Aspirate AFB 5/30 >>> Smear negative / Ctx pending Blood Cultures x2 5/30:  Negative  Stool Culture 5/31:  Negative  Stool Gastrointestinal Panel 5/31:  Negative Stool C diff 5/31:  Negative  CSF Culture 6/1 >>> Encapsulated yeast seen  ANTIBIOTICS: Triumeq 5/23 - 5/24 Flagyl 5/23 - 5/24 Vancomycin 5/23 - 5/24 Doxycycline 5/24 - 5/25 Rocephin 5/23 - 5/30 Augmentin 5/24 - 5/25 Bactrim 5/23 >>> Flucytosine 5/24 >>> Valtrex 5/24 >>> Azithromycin qweek 5/24 >>> Ambisome 5/24 >>>  SIGNIFICANT EVENTS: 05/22 - Admit with perirectal abscess  ASSESSMENT/PLAN:  31 y.o. male with sepsis secondary to perirectal abscess and disseminated cryptococcal infection. Appreciate assistance from neurology, neurosurgery, and infectious diseases. Encephalopathy has some waxing and waning.  1. Disseminated cryptococcal infection: Continuing treatment as per ID recommendations. Holding on transition to fluconazole pending results of 6/1 culture. 2. Acute hypoxic respiratory failure: Stable post extubation. Secondary to cryptococcal pneumonia. Incentive spirometry for pulmonary toilette as mental status improves. 3. Acute encephalopathy: Some signs or. The improvement. Avoiding sedatives. Likely multifactorial from delirium as well as meningoencephalitis. 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 Continuing LP drain per neurosurgery recommendations. 5. Perirectal abscess: Status post drainage. 6. Genital herpes: Continuing Foley catheter & acyclovir. 7. HIV/AIDS: Continuing prophylaxis with  Bactrim and azithromycin. Management per ID. 8. Diarrhea: Stool culture negative. Infectious workup negative. 9. 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 PT, OT, and speech therapy consulted. 10. Hypomagnesemia: Magnesium sulfate IV ordered. Repeat magnesium level with a.m. labs. 11. Hyponatremia: Mild.  Stable. Trending like lites daily. 12. Acute renal failure: Resolved. Continuing saline bolus with AmBisome. Trending like lites daily. 13. Anemia: Secondary to anemia of chronic disease. No active bleeding. Trending cell counts daily with CBC.   Prophylaxis:  SCDs, Heparin [place] q8hr, & Pepcid via tube qhs. Diet:  NPO pending speech evaluation. May need NGT placed.  Code Status:  Full Code per previous physician discussions.   Disposition:  Patient to remain in the ICU while lumbar drain in place. PT, OT, and speech therapy consulted. Family Update: Mother again updated during rounds today.  I have spent a total of 37 minutes of time today caring for the patient, reviewing the patient's electronic medical record, and with more than 50% of that time spent coordinating care with the patient as well as reviewing the continuing plan of care with the patient's mother at bedside.  Sonia Baller Ashok Cordia, M.D. [facility] Pager:  [phone] After 3pm or if no response, call [phone] 9:37 AM 01/04/17

## 2017-01-04 NOTE — Consult Note (Signed)
Physical Medicine and Rehabilitation Consult Reason for Consult: Decreased functional mobility Referring Physician: Critical care   HPI: William Pena is a 31 y.o. right handed male with history of HIV, recurrent perirectal abscesses. Per chart review patient lives alone and was independent prior to admission recently incarcerated and has been homeless. Patient has been treated at [facility] in the past with multiple types of drains. Presented 12/20/2016 with severe rectal pain and fever. Patient had been seen in the ED 3 times in the past week but due to his current incarceration and [facility] could not be transferred to [facility]. He did have a recent CT 12/17/2016 that showed a small abscess and antibiotics were recommended. Follow-up CT showed cavitary lesion in right upper lobe, serum cryptococcal antigenemia titer was elevated. 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 Underwent irrigation debridement perirectal abscess 12/21/2016. Patient has required intubation. Lumbar drain was placed 12/27/2016 per Dr. Kathyrn Sheriff as well as CT/MRI reviewed, showing L>R infarcts. Per report, small vessel infarcts in  worsening progression of small vessel occlusive infarctions along the base of the brain bilaterally with involvement affecting a large portion of the corpus striatum on the left of which findings consistent with progressive complications of meningitis.Marland Kitchen He was extubated 01/03/2017. Subcutaneous heparin for DVT prophylaxis. Physical therapy evaluation completed 01/04/2017 with recommendations of physical medicine rehabilitation consult.  Review of Systems  Unable to perform ROS: Acuity of condition   Past Medical History:  Diagnosis Date  . HIV (human immunodeficiency virus infection) ([place])   . Perirectal abscess    Past Surgical History:  Procedure Laterality Date  . INCISION AND DRAINAGE  PERIRECTAL ABSCESS    . INCISION AND DRAINAGE PERIRECTAL ABSCESS N/A 12/21/2016   Procedure: IRRIGATION AND DEBRIDEMENT PERIRECTAL ABSCESS;  Surgeon: Ralene Ok, MD;  Location: [facility];  Service: General;  Laterality: N/A;  . PLACEMENT OF LUMBAR DRAIN N/A 12/27/2016   Procedure: PLACEMENT OF LUMBAR DRAIN;  Surgeon: Consuella Lose, MD;  Location: [facility];  Service: Neurosurgery;  Laterality: N/A;   Family History  Problem Relation Age of Onset  . Diabetes Other    Social History:  reports that he has quit smoking. His smoking use included Cigarettes. He smoked 0.33 packs per day. He has never used smokeless tobacco. He reports that he does not drink alcohol or use drugs. Allergies:  Allergies  Allergen Reactions  . Augmentin [Amoxicillin-Pot Clavulanate]     "break out"   Medications Prior to Admission  Medication Sig Dispense Refill  . acetaminophen (TYLENOL) 325 MG tablet Take 975 mg by mouth 3 (three) times daily as needed for moderate pain.     Marland Kitchen docusate sodium (COLACE) 100 MG capsule Take 100 mg by mouth 2 (two) times daily.    . ondansetron (ZOFRAN ODT) 4 MG disintegrating tablet Take 1 tablet (4 mg total) by mouth every 8 (eight) hours as needed for nausea or vomiting. 15 tablet 0  . sulfamethoxazole-trimethoprim (BACTRIM DS,SEPTRA DS) 800-160 MG tablet Take 1 tablet by mouth daily.    Marland Kitchen ibuprofen (ADVIL,MOTRIN) 800 MG tablet Take 1 tablet (800 mg total) by mouth every 8 (eight) hours as needed. (Patient not taking: Reported on 12/17/2016) 21 tablet 0    Home: [place] expects to be discharged to:: Inpatient rehab Living Arrangements: Alone Additional Comments: Pt recently incarcerated, but no longer and now homeless.  Functional History: Prior Function Level of Independence: Independent Functional Status:  Mobility:  Bed Mobility Overal bed mobility: Needs Assistance Bed Mobility: Sit to Sidelying, Rolling, Supine to Sit Rolling: Max assist Supine  to sit: Total assist, +2 for physical assistance Sit to sidelying: Max assist, +2 for physical assistance, HOB elevated General bed mobility comments: Total A to get to EOB using helicoptor technique; able to assist lowering onto right elbow to return to supine but asisst with LEs and trunk.        ADL: ADL Overall ADL's : Needs assistance/impaired General ADL Comments: Pt currently requiring total assistance overall for ADL participation due to significant flexor tone and flexion pattern with all attempted movement or activity.   Cognition: Cognition Overall Cognitive Status: Impaired/Different from baseline Arousal/Alertness: Awake/alert Orientation Level: Oriented to person, Oriented to place, Disoriented to time, Disoriented to situation Attention: Sustained Sustained Attention: Impaired Sustained Attention Impairment: Verbal basic, Functional basic Memory: Impaired Memory Impairment: Decreased recall of new information Awareness: Impaired Awareness Impairment: Emergent impairment Problem Solving: Impaired Problem Solving Impairment: Functional basic Safety/Judgment: Impaired Cognition Arousal/Alertness: Awake/alert Behavior During Therapy: Flat affect Overall Cognitive Status: Impaired/Different from baseline Area of Impairment: Orientation, Attention, Following commands, Safety/judgement, Awareness, Problem solving Orientation Level: Disoriented to, Time, Situation (Knows it is June but not date despite given options) Current Attention Level: Focused Following Commands: Follows one step commands with increased time, Follows one step commands inconsistently (repetition and constant stimulus) Safety/Judgement: Decreased awareness of deficits Awareness: Intellectual Problem Solving: Slow processing, Decreased initiation, Difficulty sequencing, Requires verbal cues, Requires tactile cues General Comments: Pt perseverating on verbalizations. "[place]"  Increased time to respond to  any questions asked or commands. Requires repetition and constant stimulus to stay attended to task at hand. Grins appropriately. Decreased initiation with all motor movement. Seems to have right gaze preference but will attend to left side on command with cues.   Blood pressure 108/75, pulse (!) 59, temperature 97.2 F (36.2 C), temperature source Oral, resp. rate 15, height 5\' 6"  (1.676 m), weight 61.4 kg (135 lb 5.8 oz), SpO2 100 %. Physical Exam  Vitals reviewed. Constitutional: He appears well-developed.  Frail  HENT:  Head: Normocephalic and atraumatic.  Eyes: Right eye exhibits no discharge. Left eye exhibits no discharge.  Pupils sluggish but reactive to light  Neck: Normal range of motion. Neck supple. No thyromegaly present.  Cardiovascular: Normal rate, regular rhythm and normal heart sounds.   Respiratory: Effort normal.  Limited inspiratory effort but clear to auscultation  GI: Soft. Bowel sounds are normal. He exhibits no distension.  Musculoskeletal: He exhibits no edema or tenderness.  Neurological: [name] alert.  He would not make eye contact.  Patient remained nonverbal and would not follow motor commands.  He does grimace to deep palpation.  Exam limited by lack of participation, not seen spontaneously moving extremities DTRs 3+ throughout No clonus  Skin: Skin is warm and dry.  Psychiatric:  Unable to assess due to mentation    Results for orders placed or performed during the hospital encounter of 12/20/16 (from the past 24 hour(s))  Glucose, capillary     Status: None   Collection Time: 01/03/17  3:53 PM  Result Value Ref Range   Glucose-Capillary 98 65 - 99 mg/dL  Glucose, capillary     Status: None   Collection Time: 01/03/17  7:54 PM  Result Value Ref Range   Glucose-Capillary 89 65 - 99 mg/dL  Glucose, capillary     Status: None   Collection Time: 01/03/17 11:44 PM  Result Value Ref Range  Glucose-Capillary 89 65 - 99 mg/dL  Magnesium     Status:  Abnormal   Collection Time: 01/04/17  3:36 AM  Result Value Ref Range   Magnesium 1.6 (L) 1.7 - 2.4 mg/dL  CBC with Differential/Platelet     Status: Abnormal   Collection Time: 01/04/17  3:36 AM  Result Value Ref Range   WBC 4.3 4.0 - 10.5 K/uL   RBC 3.32 (L) 4.22 - 5.81 MIL/uL   Hemoglobin 9.0 (L) 13.0 - 17.0 g/dL   HCT 28.1 (L) 39.0 - 52.0 %   MCV 84.6 78.0 - 100.0 fL   MCH 27.1 26.0 - 34.0 pg   MCHC 32.0 30.0 - 36.0 g/dL   RDW 17.2 (H) 11.5 - 15.5 %   Platelets 275 150 - 400 K/uL   Neutrophils Relative % 60 %   Neutro Abs 2.6 1.7 - 7.7 K/uL   Lymphocytes Relative 24 %   Lymphs Abs 1.0 0.7 - 4.0 K/uL   Monocytes Relative 14 %   Monocytes Absolute 0.6 0.1 - 1.0 K/uL   Eosinophils Relative 1 %   Eosinophils Absolute 0.0 0.0 - 0.7 K/uL   Basophils Relative 1 %   Basophils Absolute 0.1 0.0 - 0.1 K/uL  Renal function panel     Status: Abnormal   Collection Time: 01/04/17  3:36 AM  Result Value Ref Range   Sodium 134 (L) 135 - 145 mmol/L   Potassium 4.5 3.5 - 5.1 mmol/L   Chloride 103 101 - 111 mmol/L   CO2 23 22 - 32 mmol/L   Glucose, Bld 85 65 - 99 mg/dL   BUN 20 6 - 20 mg/dL   Creatinine, Ser 1.02 0.61 - 1.24 mg/dL   Calcium 9.3 8.9 - 10.3 mg/dL   Phosphorus 5.7 (H) 2.5 - 4.6 mg/dL   Albumin 2.8 (L) 3.5 - 5.0 g/dL   GFR calc non Af Amer >60 >60 mL/min   GFR calc Af Amer >60 >60 mL/min   Anion gap 8 5 - 15  Glucose, capillary     Status: None   Collection Time: 01/04/17  3:50 AM  Result Value Ref Range   Glucose-Capillary 84 65 - 99 mg/dL  Glucose, capillary     Status: None   Collection Time: 01/04/17  9:26 AM  Result Value Ref Range   Glucose-Capillary 87 65 - 99 mg/dL   Comment 1 Notify RN    Comment 2 Document in Chart   Glucose, capillary     Status: None   Collection Time: 01/04/17 11:47 AM  Result Value Ref Range   Glucose-Capillary 92 65 - 99 mg/dL   No results found.  Assessment/Plan: Diagnosis: B/l embolic CVA Labs and images independently  reviewed.  Records reviewed and summated above. Stroke: Continue secondary stroke prophylaxis and Risk Factor Modification listed below:   Blood Pressure Management:  Continue current medication with prn's with permisive HTN per primary team ?Tetraparesis: fit for orthosis to prevent contractures (resting hand splint for day, wrist cock up splint at night, PRAFO, etc)  1. Does the need for close, 24 hr/day medical supervision in concert with the patient's rehab needs make it unreasonable for this patient to be served in a less intensive setting? Yes 2. Co-Morbidities requiring supervision/potential complications: HIV (meds per ID), recurrent perirectal abscesses (meds per ID), hyponatremia (cont to monitor, treat if necessary), hypomagnesemia (cont to monitor, supplement if necessary), ABLA (transfuse if necessary to ensure appropriate perfusion for increased activity tolerance) 3. Due to bladder  management, bowel management, safety, skin/wound care, disease management, medication administration, pain management and patient education, does the patient require 24 hr/day rehab nursing? Yes 4. Does the patient require coordinated care of a physician, rehab nurse, PT (1-2 hrs/day, 5 days/week), OT (1-2 hrs/day, 5 days/week) and SLP (1-2 hrs/day, 5 days/week) to address physical and functional deficits in the context of the above medical diagnosis(es)? Yes Addressing deficits in the following areas: balance, endurance, locomotion, strength, transferring, bowel/bladder control, bathing, dressing, feeding, grooming, toileting, cognition, speech, language, swallowing and psychosocial support 5. Can the patient actively participate in an intensive therapy program of at least 3 hrs of therapy per day at least 5 days per week? Potentially 6. The potential for patient to make measurable gains while on inpatient rehab is good 7. Anticipated functional outcomes upon discharge from inpatient rehab are mod assist and  max assist  with PT, mod assist and max assist with OT, mod assist and max assist with SLP. 8. Estimated rehab length of stay to reach the above functional goals is: 25-30 days. 9. Anticipated D/C setting: Other 10. Anticipated post D/C treatments: SNF 11. Overall Rehab/Functional Prognosis: good and fair  RECOMMENDATIONS: This patient's condition is appropriate for continued rehabilitative care in the following setting: CIR to decrease burden of care. Patient has agreed to participate in recommended program. Potentially Note that insurance prior authorization may be required for reimbursement for recommended care.  Comment: Rehab Admissions Coordinator to follow up.  Delice Lesch, MD, Mellody Drown Cathlyn Parsons., PA-C 01/04/2017

## 2017-01-04 NOTE — Progress Notes (Signed)
notified [facility] of Transient Brady (45 bpm) in relation to elevating drain to meet goal. No new orders at this time.

## 2017-01-04 NOTE — Progress Notes (Signed)
Neurology Progress Note  Subjective: He was extubated without difficulty yesterday. A couple of transient episodes of bradycardia with heart rate in the 40s overnight after his drain was elevated. Lumbar drain remains in place with drainage decreased to 10 mL of 2 hours per neurosurgery today. Presently, the patient complains of some pain in his chest. He denies headache. Speed of processing is slow and there is limited verbal output so this limits review of systems and examination.  Medications reviewed and reconciled.   Pertinent meds: Amphotericin B 270 mg daily Azithromycin 1200 mg weekly Flucytosine 1500 mg every 6 hours Bactrim 10 mL daily    Current Meds:   Current Facility-Administered Medications:  .  0.9 %  sodium chloride infusion, , Intravenous, Continuous, Elgergawy, Silver Huguenin, MD, Last Rate: 75 mL/hr at 01/04/17 0600 .  acetaminophen (TYLENOL) suppository 650 mg, 650 mg, Rectal, Q6H PRN, Anders Simmonds, MD, 650 mg at 01/03/17 2200 .  amphotericin B liposome (AMBISOME) 270 mg in dextrose 5 % 500 mL IVPB, 4 mg/kg, Intravenous, Q24H, Ricka Burdock, RPH, Stopped at 01/03/17 1230 .  azithromycin (ZITHROMAX) 200 MG/5ML suspension 1,200 mg, 1,200 mg, Per Tube, Q Jiles Garter, Elisabeth Cara, MD, 1,200 mg at 12/30/16 0955 .  chlorhexidine gluconate (MEDLINE KIT) (PERIDEX) 0.12 % solution 15 mL, 15 mL, Mouth Rinse, BID, Sood, Vineet, MD, 15 mL at 01/04/17 0800 .  [DISCONTINUED] diphenhydrAMINE (BENADRYL) capsule 25 mg, 25 mg, Oral, Q6H PRN, 25 mg at 12/24/16 0315 **OR** diphenhydrAMINE (BENADRYL) injection 25 mg, 25 mg, Intravenous, Q6H PRN, Rayburn, Kelly A, PA-C, 25 mg at 12/22/16 2229 .  docusate (COLACE) 50 MG/5ML liquid 100 mg, 100 mg, Per Tube, BID PRN, Chesley Mires, MD .  famotidine (PEPCID) 40 MG/5ML suspension 20 mg, 20 mg, Per Tube, QHS, Nestor, Sonia Baller, MD .  flucytosine (ANCOBON) capsule 1,500 mg, 1,500 mg, Per Tube, Q6H, Sood, Vineet, MD, 1,500 mg at 01/03/17 1221 .  heparin  injection 5,000 Units, 5,000 Units, Subcutaneous, Q8H, Javier Glazier, MD, 5,000 Units at 01/04/17 [phone] .  lidocaine (PF) (XYLOCAINE) 1 % injection 5 mL, 5 mL, Other, Once, Dixon, Stephanie N, NP .  magnesium sulfate IVPB 4 g 100 mL, 4 g, Intravenous, Once, Rumbarger, Valeda Malm, RPH, Last Rate: 50 mL/hr at 01/04/17 0823, 4 g at 01/04/17 0823 .  MEDLINE mouth rinse, 15 mL, Mouth Rinse, 10 times per day, Chesley Mires, MD, 15 mL at 01/04/17 0656 .  [DISCONTINUED] ondansetron (ZOFRAN-ODT) disintegrating tablet 4 mg, 4 mg, Oral, Q6H PRN **OR** ondansetron (ZOFRAN) injection 4 mg, 4 mg, Intravenous, Q6H PRN, Rayburn, Kelly A, PA-C, 4 mg at 12/22/16 1939 .  polyethylene glycol (MIRALAX / GLYCOLAX) packet 17 g, 17 g, Oral, Daily PRN, Rayburn, Kelly A, PA-C .  sodium chloride 0.9 % bolus 500 mL, 500 mL, Intravenous, Q24H, Nestor, Sonia Baller, MD, Stopped at 01/03/17 1030 .  sodium chloride 0.9 % bolus 500 mL, 500 mL, Intravenous, Q24H, Nestor, Sonia Baller, MD, Stopped at 01/03/17 1333 .  sulfamethoxazole-trimethoprim (BACTRIM,SEPTRA) 200-40 MG/5ML suspension 10 mL, 10 mL, Per Tube, Daily, Dixon, Melton Krebs, NP, 10 mL at 01/03/17 0951  Objective:  Temp:  [98 F (36.7 C)-98.9 F (37.2 C)] 98.4 F (36.9 C) (06/06 0400) Pulse Rate:  [41-92] 65 (06/06 0800) Resp:  [11-23] 18 (06/06 0800) BP: (108-148)/(53-103) 122/77 (06/06 0800) SpO2:  [100 %] 100 % (06/06 0800) FiO2 (%):  [30 %] 30 % (06/05 1245)  General: WDWN AA man lying in ICU bed. [name]  alert with evident psychomotor slowing. He will fix and track slowly. Speed of processing is impaired. Sustained attention is impaired. He has a paucity of verbal output. He will answer some questions but speaks in a very quiet monotone voice with significant dysarthria so his speech is difficult to understand. He followed limited commands for me, mainly limited to squeezing fingers.  HEENT: He resists passive neck flexion but no grimacing and no definite meningismus  when he is distracted. No LAD. Sclerae are anicteric. There is no conjunctival injection.  CV: Regular, no murmur. Carotid pulses are 2+ and symmetric with no bruits. Distal pulses 2+ and symmetric.  Lungs: CTAB on anterior exam.   Extremities: No C/C/E. Numerous tattoos noted over body and face.  Neuro: MS: As noted above.  CN: Pupils are equal and reactive from 3-->2 mm bilaterally. He blinks to visual threat. Eyes are conjugate with no forced deviation and no nystagmus. Volitional horizontal saccades appear intact. Corneals are present. His face appears grossly symmetric. The remainder of his cranial nerve exam is limited as he does note participate.  Motor: Normal bulk. Tone is increased in both arms which he tends to maintain with elbows and wrists flexed and hands fisted. Grips are 4/5 bilaterally. He does not otherwise participate with confrontational strength testing. No tremor or other abnormal movements are observed.  Sensation: Withdrawal noted to nailbed pressure x4.  DTRs: 3+, symmetric. Toes are mute bilaterally.  Coordination/gait: These cannot be assessed as he does not participate with the exam.   Labs: Lab Results  Component Value Date   WBC 4.3 01/04/2017   HGB 9.0 (L) 01/04/2017   HCT 28.1 (L) 01/04/2017   PLT 275 01/04/2017   GLUCOSE 85 01/04/2017   TRIG 144 12/26/2016   ALT 42 12/28/2016   AST 30 12/28/2016   NA 134 (L) 01/04/2017   K 4.5 01/04/2017   CL 103 01/04/2017   CREATININE 1.02 01/04/2017   BUN 20 01/04/2017   CO2 23 01/04/2017   INR 1.20 12/22/2016   CBC Latest Ref Rng & Units 01/04/2017 01/03/2017 01/02/2017  WBC 4.0 - 10.5 K/uL 4.3 5.6 8.1  Hemoglobin 13.0 - 17.0 g/dL 9.0(L) 7.9(L) 8.4(L)  Hematocrit 39.0 - 52.0 % 28.1(L) 24.6(L) 26.0(L)  Platelets 150 - 400 K/uL 275 257 287    No results found for: HGBA1C Lab Results  Component Value Date   ALT 42 12/28/2016   AST 30 12/28/2016   ALKPHOS 135 (H) 12/28/2016   BILITOT 0.4 12/28/2016   RPR  nonreactive HIV viral load 1,550,000 CD4 count 21   From 12/22/16: CSF WBC 4 CSF RBC 106 CSF glucose 44 CSF protein 72 Cryptococcal antigen positive with titer greater than or equal to 2560 CSF culture positive for cryptococcus neoformans  From 12/25/16: CSF WBC 29, 75% neutrophils CSF RBCs 9 CSF glucose 45 CSF protein 122 Cryptococcal antigen positive with titer greater than 2560 CSF culture positive for cryptococcus neoformans  From 12/30/16: CSF WBC 163, 98% mononuclear CSF RBC 2900 CSF glucose and protein not measured Cryptococcal antigen positive with titer 1280 CSF culture no growth to date  Radiology:  There is no new neuroimaging for review.   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 He has responded well to amphotericin B and flucytosine with negative CSF culture on 6/1. Appreciate ID assistance, will defer decision regarding duration to them given widespread disease. Lumbar drain in  place, draining well, appreciate neurosurgery assistance.   2. Acute ischemic stroke: This is acute, due to small vessel involvement from his cryptococcal infection. Continue with antifungal therapy as above. No role for antiplatelet agents or statins in this setting.  3. Acute encephalopathy: This is due to cryptococcal meningitis and acute strokes with additional contributions likely from metabolic derangements, medication effect, and respiratory failure. While he is alert today, he is abulic with minimal spontaneous verbal output, impaired sustained attention, and delayed speed of processing. I suspect these findings are the result of the strokes involving the left caudate and putamen as strokes involving these structures are associated with abulia, cognitive impairment, and decreased speech production. Additional areas of ischemia in both frontal lobes are also probably  contributing, particularly to his attentional and processing deficits. Continue supportive care. Continue to optimize metabolic status as you are. Continue to treat underlying infections as you are. Minimize sedation as tolerated. Avoid anything with strong anticholinergic properties that this is likely to worsen his encephalopathy. If speed of processing, attention, and abulia persist, may consider a trial of a neurostimulant such as amantadine.   4. 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 CD4 count this admission is 21 with high viral load greater than 1 million. Currently not receiving antiretroviral therapy in the setting of cryptococcal treatment. Appreciate ID assistance.  This was discussed with the patient's mother at the bedside. Education was provided on the diagnosis and expected evaluation and treatment. She is in agreement with the plan as noted. She was given the opportunity to ask any questions and these were addressed to her satisfaction.    Melba Coon, MD [facility]

## 2017-01-04 NOTE — Progress Notes (Signed)
Rehab Admissions Coordinator Note:  Patient was screened by Retta Diones for appropriateness for an Inpatient Acute Rehab Consult.  At this time, we are recommending Inpatient Rehab consult.  Jodell Cipro M 01/04/2017, 2:52 PM  I can be reached at [phone].

## 2017-01-04 NOTE — Evaluation (Signed)
Occupational Therapy Evaluation Patient Details Name: William Pena MRN: [id number] DOB: 15-Sep-1985 Today's Date: 01/04/2017    History of Present Illness Patient is a 31 y/o male presents with sepsis secondary to perirectal abscess s/p I&D 5/23 and disseminated cryptococcal infection including meningitis, bacteremia, and likely pneumonia. Intubated 5/28-6/5. 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 Second MRI- extension of the diffusion restriction in the left basal ganglia and right basal ganglia. s/p Lumbar drain 5/30. PMH includes advanced hiv disease   Clinical Impression   PTA, pt was independent with ADL and functional mobility. Pt currently requires max-total assistance with all ADL tasks at this time. He presents with B UE flexor tone requiring mod-max effort to obtain full PROM, decreased attention, perseveration, and decreased sitting balance impacting ability to participate in ADL at PLOF. Additionally noted R gaze preference throughout. He was able to sit at EOB for approximately 25 minutes with mod-max assist for ADL activities with one episode of bradycardia (RN aware). Pt would best benefit from CIR level therapies post-acute D/C in order to maximize return to independent PLOF. He would benefit from continued OT services while admitted to improve independence with ADL and functional mobility. OT will continue to follow acutely.        Follow Up Recommendations  CIR;Supervision/Assistance - 24 hour    Equipment Recommendations  Other (comment) (BTD at next venue of care)    Recommendations for Other Services Rehab consult     Precautions / Restrictions Precautions Precautions: Fall Restrictions Weight Bearing Restrictions: No      Mobility Bed Mobility Overal bed mobility: Needs Assistance Bed Mobility: Sit to Sidelying;Rolling;Supine to Sit Rolling: Max assist   Supine to sit: Total assist;+2 for physical  assistance   Sit to sidelying: Max assist;+2 for physical assistance;HOB elevated General bed mobility comments: Total A to get to EOB using helicoptor technique; able to assist lowering onto right elbow to return to supine but asisst with LEs and trunk.  Transfers                      Balance Overall balance assessment: Needs assistance Sitting-balance support: Feet supported;No upper extremity supported Sitting balance-Leahy Scale: Zero Sitting balance - Comments: Requires Mod-Max A sitting EOB; sat EOB ~25 mins. Tone in UE worsened with tasks. Postural control: Posterior lean                                 ADL either performed or assessed with clinical judgement   ADL Overall ADL's : Needs assistance/impaired                                       General ADL Comments: Pt currently requiring max-total assistance overall for ADL participation due to significant flexor tone and flexion pattern with all attempted movement or activity.      Vision   Vision Assessment?: Vision impaired- to be further tested in functional context Additional Comments: Pt with apparent R gaze preference. Easily distracted and unable to maintain attention for long enough to complete full visual assessment. Pt requires multimodal cues to maintain visual attention to task.      Perception     Praxis      Pertinent Vitals/Pain Pain Assessment: Faces Faces Pain Scale: No hurt     Hand Dominance  Extremity/Trunk Assessment Upper Extremity Assessment Upper Extremity Assessment: RUE deficits/detail;LUE deficits/detail RUE Deficits / Details: Flexor tone requiring tone management techniques to obtain full PROM into elbow extension. Increasing flexor tone when concentrating on task with increase in tremors.  LUE Deficits / Details: Significant flexor tone and able to break with mod to max effort in supine. Increasing tone and tremor with effortful activity and  concentration.    Lower Extremity Assessment Lower Extremity Assessment: Defer to PT evaluation       Communication Communication Communication: Expressive difficulties (low toned voice, minimal verbalizations)   Cognition Arousal/Alertness: Awake/alert Behavior During Therapy: Flat affect Overall Cognitive Status: Impaired/Different from baseline Area of Impairment: Orientation;Attention;Following commands;Safety/judgement;Awareness;Problem solving                 Orientation Level: Disoriented to;Time;Situation (Knows it is June but not date despite given options) Current Attention Level: Focused   Following Commands: Follows one step commands with increased time;Follows one step commands inconsistently (repetition and constant stimulus) Safety/Judgement: Decreased awareness of deficits Awareness: Intellectual Problem Solving: Slow processing;Decreased initiation;Difficulty sequencing;Requires verbal cues;Requires tactile cues General Comments: Pt perseverating on verbalizations. "William Pena"  Increased time to respond to any questions asked or commands. Requires repetition and constant stimulus to stay attended to task at hand. Grins appropriately. Decreased initiation with all motor movement. Seems to have right gaze preference but will attend to left side on command with cues.    General Comments       Exercises     Shoulder Instructions      Home Living Family/patient expects to be discharged to:: Inpatient rehab Living Arrangements: Alone                               Additional Comments: Pt recently incarcerated, but no longer and now homeless.      Prior Functioning/Environment Level of Independence: Independent                 OT Problem List: Decreased strength;Decreased range of motion;Decreased activity tolerance;Impaired balance (sitting and/or standing);Decreased safety awareness;Decreased knowledge of use of DME or AE;Impaired  vision/perception;Decreased coordination;Decreased knowledge of precautions;Pain;Impaired UE functional use;Impaired tone;Impaired sensation;Decreased cognition      OT Treatment/Interventions: Self-care/ADL training;Therapeutic exercise;Energy conservation;DME and/or AE instruction;Neuromuscular education;Splinting;Therapeutic activities;Cognitive remediation/compensation;Visual/perceptual remediation/compensation;Patient/family education;Balance training    OT Goals(Current goals can be found in the care plan section) Acute Rehab OT Goals Patient Stated Goal: per mother to eat pizza OT Goal Formulation: With patient/family Time For Goal Achievement: 01/18/17 Potential to Achieve Goals: Good  OT Frequency: Min 3X/week   Barriers to D/C:            Co-evaluation PT/OT/SLP Co-Evaluation/Treatment: Yes Reason for Co-Treatment: Complexity of the patient's impairments (multi-system involvement);Necessary to address cognition/behavior during functional activity;For patient/therapist safety   OT goals addressed during session: ADL's and self-care;Strengthening/ROM      AM-PAC PT "6 Clicks" Daily Activity     Outcome Measure Help from another person eating meals?: Total Help from another person taking care of personal grooming?: Total Help from another person toileting, which includes using toliet, bedpan, or urinal?: Total Help from another person bathing (including washing, rinsing, drying)?: Total Help from another person to put on and taking off regular upper body clothing?: Total Help from another person to put on and taking off regular lower body clothing?: Total 6 Click Score: 6   End of Session Nurse Communication: Mobility status  Activity Tolerance: Patient  tolerated treatment well Patient left: in bed;with family/visitor present  OT Visit Diagnosis: Muscle weakness (generalized) (M62.81);Cognitive communication deficit (R41.841);Other abnormalities of gait and mobility  (R26.89);Pain Pain - Right/Left: Left Pain - part of body: Shoulder                Time: 8938-1017 OT Time Calculation (min): 43 min Charges:  OT General Charges $OT Visit: 1 Procedure OT Evaluation $OT Eval Moderate Complexity: 1 Procedure G-Codes:     Norman Herrlich, MS OTR/L  Pager: [place] A Amari Zagal 01/04/2017, 5:07 PM

## 2017-01-04 NOTE — Progress Notes (Signed)
[facility] for Infect[facility]    Date of Admission:  12/20/2016   Total days of antibiotics 15 Day 15L-ampho/flucytosine   ID: William Pena is a 31 y.o. male with advanced hiv disease, CD 4 count of 21/VL1.73M with disseminated Cryptococcal disease including meningitis, bacteremia, and likely pneumonia.   Principal Problem:   Perirectal abscess Active Problems:   Chronic pain   AIDS (acquired immune deficiency syndrome) (HCC)   Hyponatremia   Normocytic anemia   Elevated LFTs   Pulmonary infiltrates   Hypomagnesemia   Encephalopathy acute   SIRS (systemic inflammatory response syndrome) (HCC)   Acute respiratory failure (HCC)   Rectal abscess   Cryptococcal meningoencephalitis (HCC)   Cryptococcosis (HCC)   Cavitary pneumonia   Diffuse lymphadenopathy   Drug rash   Elevated intracranial pressure   HIV disease (HCC)   Lymphadenopathy of head and neck    Subjective: Making eye contact, sitting on side of bed working with PT. Able to communicate to me his chest is hurting.   Medications:  . azithromycin  1,200 mg Per Tube Q Fri  . chlorhexidine gluconate (MEDLINE KIT)  15 mL Mouth Rinse BID  . famotidine  20 mg Per Tube QHS  . flucytosine  1,500 mg Per Tube Q6H  . heparin subcutaneous  5,000 Units Subcutaneous Q8H  . lidocaine (PF)  5 mL Other Once  . mouth rinse  15 mL Mouth Rinse 10 times per day  . sulfamethoxazole-trimethoprim  10 mL Per Tube Daily    Objective: Vital signs in last 24 hours: Temp:  [98 F (36.7 C)-98.9 F (37.2 C)] 98 F (36.7 C) (06/06 0800) Pulse Rate:  [41-92] 65 (06/06 0800) Resp:  [11-23] 18 (06/06 0800) BP: (108-148)/(53-103) 122/77 (06/06 0800) SpO2:  [100 %] 100 % (06/06 0800) FiO2 (%):  [30 %] 30 % (06/05 1245)  General appearance: alert and in no distress. Sitting up on the side of the bed with PT. Only gross motor movement to UE  demonstrated.  Eyes: conjunctivae/corneas clear. PERRL, EOM's intact.  Neck: no carotid bruit, no JVD, supple, symmetrical, trachea midline and improved lymphadenopathy. No nuchal rigidity with passive neck ROM Resp: Clear to auscultation Cardio: regular rate and rhythm, S1, S2 normal, no murmur, click, rub or gallop Male genitalia: lesions still in place. Appear more pearl shaped.  Extremities: extremities normal, atraumatic, no cyanosis or edema Skin: color, texture, turgor normal. No rashes or lesions Lymph nodes: Overall improvement in lymphadenopathy  Neurologic: Alert. Only gross motor movement to UE demonstrated. Does verbalize.  Following some commands for me today. Moves all extremities when asked although weak.    Lab Results  Recent Labs  01/03/17 0221 01/04/17 0336  WBC 5.6 4.3  HGB 7.9* 9.0*  HCT 24.6* 28.1*  NA 133* 134*  K 4.6 4.5  CL 104 103  CO2 23 23  BUN 20 20  CREATININE 0.91 1.02   Liver Panel  Recent Labs  01/03/17 0221 01/04/17 0336  ALBUMIN 2.6* 2.8*   Sedimentation Rate No results for input(s): ESRSEDRATE in the last 72 hours. C-Reactive Protein No results for input(s): CRP in the last 72 hours.  Microbiology:  Studies/Results: No results found.   Assessment/Plan:  CM c/b small vessel infarcts=  - Today is day 15 of L-ampho and flucytosine - Lumbar drain in place still; Straw colored - CSF Cx 12/30/16 - yeast now growing on Day 5. Would continue L-ampho + Flucytosine and resample CSF today/tomorrow.  - Continually  needing mag replenishment  - Neuro status continues to improve.  HIV Disease = - tx deferred due to CM treatment - OI proph -continue with bactrim ss daily + Azith weekly   Perirectal abscess=  - received a short course of abtx after debridement - continue with wound care  Pneumonia, Cryptococcal =  - 5/28 BAL Cx >> Cryptococcal Neoformans confirmed  - mtB PCR and AFB negative - CXR images reviewed over the last  few days and decreased lung opacities and improved aeration noted - Extubated yesterday   Lymphadenopathy =  - Deferred biopsy at this time - Resolving - low threshold to biopsy should fevers return   HSV genital lesions =  - Last day of treatment today  Updated mother with the above plan.   If neurosurgery would please assist with CSF sampling 01/05/17 it would be greatly appreciated. Orders entered, thank you.   [name], MSN, NP-C [facility] [place] Cell: [phone] Pager: [phone]  01/04/2017, 10:50 AM

## 2017-01-04 NOTE — Progress Notes (Signed)
Patient ID: William Pena, male   DOB: [birth year], 31 y.o.   MRN: [id number]  [facility] Progress Note  8 Days Post-Op  Subjective: CC- perirectal abscess Sitting up in bed. Extubated yesterday and doing well. Tolerating dressing changes.  Objective: Vital signs in last 24 hours: Temp:  [98 F (36.7 C)-98.9 F (37.2 C)] 98 F (36.7 C) (06/06 0800) Pulse Rate:  [41-92] 65 (06/06 0800) Resp:  [11-23] 18 (06/06 0800) BP: (108-148)/(53-103) 122/77 (06/06 0800) SpO2:  [100 %] 100 % (06/06 0800) FiO2 (%):  [30 %] 30 % (06/05 1245) Last BM Date: 01/04/17  Intake/Output from previous day: 06/05 0701 - 06/06 0700 In: 3610 [I.V.:1725; NG/GT:385; IV Piggyback:1500] Out: 4565 [Urine:4375; Drains:190] Intake/Output this shift: Total I/O In: 325 [I.V.:225; IV Piggyback:100] Out: 633 [Urine:600; Drains:33]  PE: Gen:  Alert, NAD, nonverbal Pulm: effort normal Abd: Soft, NT/ND GU: wound about 1cm at deepest, no active drainage or surrounding erythema, no fluctuance >> wound repacked     Lab Results:   Recent Labs  01/03/17 0221 01/04/17 0336  WBC 5.6 4.3  HGB 7.9* 9.0*  HCT 24.6* 28.1*  PLT 257 275   BMET  Recent Labs  01/03/17 0221 01/04/17 0336  NA 133* 134*  K 4.6 4.5  CL 104 103  CO2 23 23  GLUCOSE 90 85  BUN 20 20  CREATININE 0.91 1.02  CALCIUM 9.1 9.3   PT/INR No results for input(s): LABPROT, INR in the last 72 hours. CMP     Component Value Date/Time   NA 134 (L) 01/04/2017 0336   K 4.5 01/04/2017 0336   CL 103 01/04/2017 0336   CO2 23 01/04/2017 0336   GLUCOSE 85 01/04/2017 0336   BUN 20 01/04/2017 0336   CREATININE 1.02 01/04/2017 0336   CALCIUM 9.3 01/04/2017 0336   PROT 8.3 (H) 12/28/2016 0512   ALBUMIN 2.8 (L) 01/04/2017 0336   AST 30 12/28/2016 0512   ALT 42 12/28/2016 0512   ALKPHOS 135 (H) 12/28/2016 0512   BILITOT 0.4 12/28/2016 0512   GFRNONAA >60 01/04/2017 0336   GFRAA >60 01/04/2017 0336   Lipase  No results  found for: LIPASE     Studies/Results: No results found.  Anti-infectives: Anti-infectives    Start     Dose/Rate Route Frequency Ordered Stop   12/31/16 1000  sulfamethoxazole-trimethoprim (BACTRIM,SEPTRA) 200-40 MG/5ML suspension 10 mL     10 mL Per Tube Daily 12/30/16 1418     12/31/16 1000  amphotericin B liposome (AMBISOME) 270 mg in dextrose 5 % 500 mL IVPB     4 mg/kg  67.6 kg 250 mL/hr over 120 Minutes Intravenous Every 24 hours 12/30/16 1510     12/30/16 1000  azithromycin (ZITHROMAX) 200 MG/5ML suspension 1,200 mg     1,200 mg Per Tube Every Fri 12/26/16 2030     12/29/16 1000  sulfamethoxazole-trimethoprim (BACTRIM,SEPTRA) 200-40 MG/5ML suspension 20 mL  Status:  Discontinued     20 mL Per Tube Daily 12/28/16 1226 12/30/16 1418   12/28/16 1000  amphotericin B liposome (AMBISOME) 240 mg in dextrose 5 % 500 mL IVPB  Status:  Discontinued     3.5 mg/kg  67.6 kg 250 mL/hr over 120 Minutes Intravenous Every 24 hours 12/28/16 0853 12/30/16 1510   12/28/16 0900  amphotericin B liposome (AMBISOME) 240 mg in dextrose 5 % 500 mL IVPB  Status:  Discontinued     3.5 mg/kg  67.6 kg 250 mL/hr over 120 Minutes Intravenous Every  24 hours 12/28/16 0832 12/28/16 0853   12/27/16 0000  flucytosine (ANCOBON) capsule 1,500 mg     1,500 mg Per Tube Every 6 hours 12/26/16 1909     12/26/16 2200  sulfamethoxazole-trimethoprim (BACTRIM DS,SEPTRA DS) 800-160 MG per tablet 1 tablet  Status:  Discontinued     1 tablet Per Tube Every 12 hours 12/26/16 1909 12/26/16 1912   12/26/16 2200  valACYclovir (VALTREX) tablet 1,000 mg     1,000 mg Per Tube 2 times daily 12/26/16 1909 01/01/17 2359   12/26/16 2200  sulfamethoxazole-trimethoprim (BACTRIM,SEPTRA) 200-40 MG/5ML suspension 20 mL  Status:  Discontinued     20 mL Per Tube Every 12 hours 12/26/16 1912 12/28/16 1226   12/23/16 2200  sulfamethoxazole-trimethoprim (BACTRIM DS,SEPTRA DS) 800-160 MG per tablet 1 tablet  Status:  Discontinued     1  tablet Oral Every 12 hours 12/23/16 1822 12/26/16 1909   12/23/16 1900  cefTRIAXone (ROCEPHIN) 2 g in dextrose 5 % 50 mL IVPB  Status:  Discontinued     2 g 100 mL/hr over 30 Minutes Intravenous Every 24 hours 12/23/16 1821 12/28/16 1122   12/23/16 0900  amphotericin B liposome (AMBISOME) 200 mg in dextrose 5 % 500 mL IVPB  Status:  Discontinued     200 mg 250 mL/hr over 120 Minutes Intravenous Every 24 hours 12/22/16 0919 12/28/16 0832   12/23/16 0800  azithromycin (ZITHROMAX) tablet 1,200 mg  Status:  Discontinued     1,200 mg Oral Weekly 12/22/16 0811 12/26/16 2029   12/22/16 2200  valACYclovir (VALTREX) tablet 1,000 mg  Status:  Discontinued     1,000 mg Oral 2 times daily 12/22/16 1611 12/26/16 1909   12/22/16 1800  amoxicillin-clavulanate (AUGMENTIN) 875-125 MG per tablet 1 tablet  Status:  Discontinued     1 tablet Oral 2 times daily with meals 12/22/16 1446 12/23/16 1821   12/22/16 1800  doxycycline (VIBRA-TABS) tablet 100 mg  Status:  Discontinued     100 mg Oral 2 times daily with meals 12/22/16 1446 12/23/16 1821   12/22/16 1200  flucytosine (ANCOBON) capsule 1,500 mg  Status:  Discontinued     1,500 mg Oral Every 6 hours 12/22/16 0614 12/26/16 1909   12/22/16 1000  azithromycin (ZITHROMAX) tablet 250 mg  Status:  Discontinued     250 mg Oral Daily 12/21/16 0224 12/21/16 1131   12/22/16 1000  doxycycline (VIBRA-TABS) tablet 100 mg  Status:  Discontinued     100 mg Oral Every 12 hours 12/22/16 0942 12/22/16 1446   12/22/16 0000  amphotericin B liposome (AMBISOME) 190 mg in dextrose 5 % 500 mL IVPB  Status:  Discontinued     3 mg/kg  64.2 kg 250 mL/hr over 120 Minutes Intravenous Every 24 hours 12/21/16 2313 12/22/16 0919   12/22/16 0000  flucytosine (ANCOBON) capsule 250 mg  Status:  Discontinued     250 mg Oral Every 6 hours 12/21/16 2314 12/21/16 2325   12/22/16 0000  flucytosine (ANCOBON) capsule 1,500 mg  Status:  Discontinued     1,500 mg Oral Every 6 hours 12/21/16 2326  12/22/16 0614   12/21/16 1645  cefTRIAXone (ROCEPHIN) 2 g in dextrose 5 % 50 mL IVPB  Status:  Discontinued     2 g 100 mL/hr over 30 Minutes Intravenous Every 24 hours 12/21/16 1539 12/22/16 1446   12/21/16 1645  metroNIDAZOLE (FLAGYL) IVPB 500 mg  Status:  Discontinued     500 mg 100 mL/hr over 60 Minutes Intravenous  Every 8 hours 12/21/16 1539 12/21/16 2348   12/21/16 1600  cefTRIAXone (ROCEPHIN) 2 g in dextrose 5 % 50 mL IVPB  Status:  Discontinued     2 g 100 mL/hr over 30 Minutes Intravenous Every 24 hours 12/21/16 1131 12/21/16 1558   12/21/16 1400  vancomycin (VANCOCIN) IVPB 750 mg/150 ml premix  Status:  Discontinued     750 mg 150 mL/hr over 60 Minutes Intravenous Every 8 hours 12/21/16 0142 12/22/16 0942   12/21/16 1000  sulfamethoxazole-trimethoprim (BACTRIM DS,SEPTRA DS) 800-160 MG per tablet 1 tablet  Status:  Discontinued     1 tablet Oral Daily 12/21/16 0058 12/21/16 0100   12/21/16 1000  abacavir-dolutegravir-lamiVUDine (TRIUMEQ) 600-50-300 MG per tablet 1 tablet  Status:  Discontinued     1 tablet Oral Daily 12/21/16 0204 12/22/16 0903   12/21/16 1000  sulfamethoxazole-trimethoprim (BACTRIM DS,SEPTRA DS) 800-160 MG per tablet 1 tablet  Status:  Discontinued     1 tablet Oral Daily 12/21/16 0204 12/23/16 1822   12/21/16 0800  ceFEPIme (MAXIPIME) 2 g in dextrose 5 % 50 mL IVPB  Status:  Discontinued     2 g 100 mL/hr over 30 Minutes Intravenous Every 8 hours 12/21/16 0137 12/21/16 1131   12/21/16 0600  piperacillin-tazobactam (ZOSYN) IVPB 3.375 g  Status:  Discontinued     3.375 g 12.5 mL/hr over 240 Minutes Intravenous Every 8 hours 12/21/16 0058 12/21/16 0132   12/21/16 0200  metroNIDAZOLE (FLAGYL) IVPB 500 mg  Status:  Discontinued     500 mg 100 mL/hr over 60 Minutes Intravenous Every 8 hours 12/21/16 0137 12/22/16 1446   12/21/16 0145  ceFEPIme (MAXIPIME) 2 g in dextrose 5 % 50 mL IVPB     2 g 100 mL/hr over 30 Minutes Intravenous  Once 12/21/16 0137 12/21/16 0358     12/21/16 0145  vancomycin (VANCOCIN) IVPB 1000 mg/200 mL premix     1,000 mg 200 mL/hr over 60 Minutes Intravenous  Once 12/21/16 0142 12/21/16 0429   12/21/16 0130  piperacillin-tazobactam (ZOSYN) IVPB 3.375 g  Status:  Discontinued     3.375 g 100 mL/hr over 30 Minutes Intravenous  Once 12/21/16 0115 12/21/16 0132   12/21/16 0130  azithromycin (ZITHROMAX) powder 1 g     1 g Oral  Once 12/21/16 0125 12/21/16 0328       Assessment/Plan 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 Baxter Flattery (ID) VTE: SCDs FEN: IVF, NPO  Plan: Wound is healing well, no surgical intervention needed at this time. Continue BID dressing changes. General surgery will sign off, please call with concerns.   LOS: 14 days    Jerrye Beavers , [facility] Surgery 01/04/2017, 10:51 AM Pager: [phone] Consults: [phone] Mon-Fri 7:00 am-4:30 pm Sat-Sun 7:00 am-11:30 am

## 2017-01-04 NOTE — Progress Notes (Signed)
Pt awake, alert Lumbar drain in place Drainage decreased to 10 cc q 2 hours 2 episodes of bradycardia overnight, but resolved. No issues currently. Continue to monitor.

## 2017-01-04 NOTE — Care Management Note (Signed)
Case Management Note  Patient Details  Name: William Pena MRN: [id number] Date of Birth: [birth year]  Subjective/Objective:                 Per previous notes, patient is no longer in custody. 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 Patient extubated 6/5. Multiple antiinfectives. Neuro status not at baseline, per nursing report team may consider cortrak in next few days.   Action/Plan:  CM will continue to follow. Patient's support has been mother.  Expected Discharge Date:  12/22/16               Expected Discharge Plan:  [place]  In-House Referral:     Discharge planning Services  CM Consult  Post Acute Care Choice:    Choice offered to:     DME Arranged:    DME Agency:     HH Arranged:    [place] Agency:     Status of Service:  In process, will continue to follow  If discussed at Long Length of Stay Meetings, dates discussed:    Additional Comments:  Carles Collet, RN 01/04/2017, 2:41 PM

## 2017-01-04 NOTE — Evaluation (Signed)
Clinical/Bedside Swallow Evaluation Patient Details  Name: William Pena MRN: [id number] Date of Birth: 09/24/1985  Today's Date: 01/04/2017 Time: SLP Start Time (ACUTE ONLY): 1051 SLP Stop Time (ACUTE ONLY): 1127 SLP Time Calculation (min) (ACUTE ONLY): 36 min  Past Medical History:  Past Medical History:  Diagnosis Date  . HIV (human immunodeficiency virus infection) ([place])   . Perirectal abscess    Past Surgical History:  Past Surgical History:  Procedure Laterality Date  . INCISION AND DRAINAGE PERIRECTAL ABSCESS    . INCISION AND DRAINAGE PERIRECTAL ABSCESS N/A 12/21/2016   Procedure: IRRIGATION AND DEBRIDEMENT PERIRECTAL ABSCESS;  Surgeon: Ralene Ok, MD;  Location: [place];  Service: General;  Laterality: N/A;  . PLACEMENT OF LUMBAR DRAIN N/A 12/27/2016   Procedure: PLACEMENT OF LUMBAR DRAIN;  Surgeon: Consuella Lose, MD;  Location: [place];  Service: Neurosurgery;  Laterality: N/A;   HPI:  Patient is a 31 y/o male presents with sepsis secondary to perirectal abscess s/p I&D 5/23 and disseminated cryptococcal infection including meningitis, bacteremia, and likely pneumonia. Intubated 5/28-6/5. 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 Second MRI- extension of the diffusion restriction in the left basal ganglia and right basal ganglia. s/p Lumbar drain 5/30. PMH includes advanced hiv disease   Assessment / Plan / Recommendation Clinical Impression  Despite pt's overall cognitive appearance (see speech/language evaluation for further details), he has swift, automatic oral preparation and transit with thin liquids, purees, and chopped textures. Mod cues were provided for sustained attention to mastication of soft solids with need for pureed boluses to assist in clearance as well. Pt cognitively was not able to consume the full 3 ounces of water without stopping, but he did take in large, sequential boluses without any overt  signs of difficulty. He can produce a strong, clear voice when cued. A consistent second swallow was observed with solids. Recommend to initiate Dys 2 diet and thin liquids but with close supervision and monitoring, particularly in light of prolonged intubation and fluctuating mentation.  SLP Visit Diagnosis: Dysphagia, unspecified (R13.10)    Aspiration Risk  Mild aspiration risk;Moderate aspiration risk    Diet Recommendation Dysphagia 2 (Fine chop);Thin liquid   Liquid Administration via: Cup;Straw Medication Administration: Whole meds with puree (crush larger ones) Supervision: Staff to assist with self feeding;Full supervision/cueing for compensatory strategies Compensations: Minimize environmental distractions;Slow rate;Small sips/bites;Follow solids with liquid Postural Changes: Seated upright at 90 degrees;Remain upright for at least 30 minutes after po intake    Other  Recommendations Oral Care Recommendations: Oral care BID   Follow up Recommendations Inpatient Rehab      Frequency and Duration min 2x/week  2 weeks       Prognosis Prognosis for Safe Diet Advancement: Good Barriers to Reach Goals: Cognitive deficits      Swallow Study   General HPI: Patient is a 31 y/o male presents with sepsis secondary to perirectal abscess s/p I&D 5/23 and disseminated cryptococcal infection including meningitis, bacteremia, and likely pneumonia. Intubated 5/28-6/5. 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 Second MRI- extension of the diffusion restriction in the left basal ganglia and right basal ganglia. s/p Lumbar drain 5/30. PMH includes advanced hiv disease Type of Study: Bedside Swallow Evaluation Previous Swallow Assessment: none in chart Diet Prior to this Study: NPO Temperature Spikes Noted: No Respiratory Status: Room air History of Recent Intubation: Yes Length of Intubations (days): 8 days Date extubated:  01/03/17 Behavior/Cognition: Alert;Distractible;Requires cueing Oral Care Completed by  SLP: No Oral Cavity - Dentition: Adequate natural dentition Self-Feeding Abilities: Total assist Patient Positioning: Other (comment) (EOB) Baseline Vocal Quality: Normal    Oral/Motor/Sensory Function Overall Oral Motor/Sensory Function: Generalized oral weakness   Ice Chips Ice chips: Within functional limits Presentation: Spoon   Thin Liquid Thin Liquid: Within functional limits Presentation: Cup;Spoon;Straw    Nectar Thick Nectar Thick Liquid: Not tested   Honey Thick Honey Thick Liquid: Not tested   Puree Puree: Impaired Presentation: Spoon Pharyngeal Phase Impairments: Multiple swallows   Solid   GO   Solid: Impaired Oral Phase Impairments: Impaired mastication Oral Phase Functional Implications: Oral residue        William Pena 01/04/2017,1:12 PM  William Pena, M.A. CCC-SLP [phone]

## 2017-01-04 NOTE — Progress Notes (Signed)
Pharmacy Consult:  Electrolyte Replacement  27 yom with advanced HIV disease currently on treatment for disseminated cryptococcal disease with bacteremia and meningitis. Pharmacy consulted to manage electrolytes. SCr up slightly to 1.02. Continues on saline boluses before and after ambisome dose  K up to 4.5 today without supplementation Mag low at 1.6   Plan: No potassium needed Magnesium 4gm IV x 1  Monitor K and Mag closely and order additional replacement prn  Salome Arnt, PharmD, BCPS 01/04/2017 7:41 AM

## 2017-01-04 NOTE — Evaluation (Signed)
Speech Language Pathology Evaluation Patient Details Name: William Pena MRN: [id number] DOB: 08/25/1985 Today's Date: 01/04/2017 Time: 4008-6761 SLP Time Calculation (min) (ACUTE ONLY): 36 min  Problem List:  Patient Active Problem List   Diagnosis Date Noted  . Elevated intracranial pressure   . HIV disease ([place])   . Lymphadenopathy of head and neck   . Rectal abscess   . 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 Cryptococcosis ([place])   . Cavitary pneumonia   . Diffuse lymphadenopathy   . Drug rash   . Hypomagnesemia 12/23/2016  . Encephalopathy acute 12/23/2016  . SIRS (systemic inflammatory response syndrome) ([place]) 12/23/2016  . Acute respiratory failure ([place]) 12/23/2016  . Pulmonary infiltrates   . Perirectal abscess 12/21/2016  . Hyponatremia 12/21/2016  . Normocytic anemia 12/21/2016  . Elevated LFTs 12/21/2016  . AIDS (acquired immune deficiency syndrome) ([place]) 07/07/2016  . Chronic pain 06/20/2011   Past Medical History:  Past Medical History:  Diagnosis Date  . HIV (human immunodeficiency virus infection) ([place])   . Perirectal abscess    Past Surgical History:  Past Surgical History:  Procedure Laterality Date  . INCISION AND DRAINAGE PERIRECTAL ABSCESS    . INCISION AND DRAINAGE PERIRECTAL ABSCESS N/A 12/21/2016   Procedure: IRRIGATION AND DEBRIDEMENT PERIRECTAL ABSCESS;  Surgeon: Ralene Ok, MD;  Location: [place];  Service: General;  Laterality: N/A;  . PLACEMENT OF LUMBAR DRAIN N/A 12/27/2016   Procedure: PLACEMENT OF LUMBAR DRAIN;  Surgeon: Consuella Lose, MD;  Location: [place];  Service: Neurosurgery;  Laterality: N/A;   HPI:  Patient is a 31 y/o male presents with sepsis secondary to perirectal abscess s/p I&D 5/23 and disseminated cryptococcal infection including meningitis, bacteremia, and likely pneumonia. Intubated 5/28-6/5. 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 Second MRI- extension of  the diffusion restriction in the left basal ganglia and right basal ganglia. s/p Lumbar drain 5/30. PMH includes advanced hiv disease   Assessment / Plan / Recommendation Clinical Impression  Pt has delayed processing, slowed initiation, and reduced sustained attention, all impacting other levels of function, including command following and communication. When you can get his attention he does follow simple one-step commands, and he demonstrated orientation to person and location. He knew the month but thought the year was 22. His verbal output is limited to short phrases at best, with intelligibility reduced by imprecise articulation. Perseverative errors are also observed. Pt would benefit from SLP f/u to maximize functional communication, cognition, and safety.    SLP Assessment  SLP Recommendation/Assessment: Patient needs continued Speech Lanaguage Pathology Services SLP Visit Diagnosis: Cognitive communication deficit (R41.841)    Follow Up Recommendations  Inpatient Rehab    Frequency and Duration min 2x/week  2 weeks      SLP Evaluation Cognition  Overall Cognitive Status: Impaired/Different from baseline Arousal/Alertness: Awake/alert Orientation Level: Oriented to person;Oriented to place;Disoriented to time;Disoriented to situation Attention: Sustained Sustained Attention: Impaired Sustained Attention Impairment: Verbal basic;Functional basic Memory: Impaired Memory Impairment: Decreased recall of new information Awareness: Impaired Awareness Impairment: Emergent impairment Problem Solving: Impaired Problem Solving Impairment: Functional basic Safety/Judgment: Impaired       Comprehension  Auditory Comprehension Overall Auditory Comprehension: Impaired Commands: Impaired One Step Basic Commands: 25-49% accurate Conversation: Simple Interfering Components: Attention;Processing speed EffectiveTechniques: Extra processing time;Visual/Gestural cues;Repetition     Expression Expression Primary Mode of Expression: Verbal Verbal Expression Overall Verbal Expression: Impaired Initiation: Impaired Automatic Speech: Name Level of Generative/Spontaneous Verbalization: Phrase Pragmatics: Impairment Impairments: Abnormal affect;Monotone Non-Verbal Means  of Communication: Not applicable   Oral / Motor  Oral Motor/Sensory Function Overall Oral Motor/Sensory Function: Generalized oral weakness Motor Speech Overall Motor Speech: Impaired Phonation: Low vocal intensity Articulation: Impaired Level of Impairment: Phrase Intelligibility: Intelligibility reduced Word: 50-74% accurate Phrase: 50-74% accurate   GO                    Germain Osgood 01/04/2017, 1:39 PM  Germain Osgood, M.A. CCC-SLP [phone]

## 2017-01-05 LAB — RENAL FUNCTION PANEL
ALBUMIN: 2.6 g/dL — AB (ref 3.5–5.0)
Anion gap: 7 (ref 5–15)
BUN: 18 mg/dL (ref 6–20)
CO2: 22 mmol/L (ref 22–32)
CREATININE: 0.94 mg/dL (ref 0.61–1.24)
Calcium: 9.3 mg/dL (ref 8.9–10.3)
Chloride: 101 mmol/L (ref 101–111)
GFR calc Af Amer: >60 mL/min (ref >60)
GFR calc non Af Amer: >60 mL/min (ref >60)
Glucose, Bld: 89 mg/dL (ref 65–99)
Phosphorus: 5.6 mg/dL — ABNORMAL HIGH (ref 2.5–4.6)
Potassium: 4.2 mmol/L (ref 3.5–5.1)
SODIUM: 130 mmol/L — AB (ref 135–145)

## 2017-01-05 LAB — CBC WITH DIFFERENTIAL/PLATELET
BASOS ABS: 0 10*3/uL (ref 0.0–0.1)
BASOS PCT: 1 %
EOS ABS: 0.1 10*3/uL (ref 0.0–0.7)
EOS PCT: 2 %
HCT: 27.1 % — ABNORMAL LOW (ref 39.0–52.0)
Hemoglobin: 8.6 g/dL — ABNORMAL LOW (ref 13.0–17.0)
Lymphocytes Relative: 24 %
Lymphs Abs: 0.7 10*3/uL (ref 0.7–4.0)
MCH: 27.3 pg (ref 26.0–34.0)
MCHC: 31.7 g/dL (ref 30.0–36.0)
MCV: 86 fL (ref 78.0–100.0)
MONO ABS: 0.3 10*3/uL (ref 0.1–1.0)
Monocytes Relative: 12 %
Neutro Abs: 1.7 10*3/uL (ref 1.7–7.7)
Neutrophils Relative %: 61 %
PLATELETS: 255 10*3/uL (ref 150–400)
RBC: 3.15 MIL/uL — ABNORMAL LOW (ref 4.22–5.81)
RDW: 17.2 % — AB (ref 11.5–15.5)
WBC: 2.8 10*3/uL — ABNORMAL LOW (ref 4.0–10.5)

## 2017-01-05 LAB — CSF CELL COUNT WITH DIFFERENTIAL
LYMPHS CSF: 95 % — AB (ref 40–80)
Monocyte-Macrophage-Spinal Fluid: 2 % — ABNORMAL LOW (ref 15–45)
RBC Count, CSF: 23 /mm3 — ABNORMAL HIGH
SEGMENTED NEUTROPHILS-CSF: 3 % (ref 0–6)
TUBE #: 0
WBC, CSF: 18 /mm3 (ref 0–5)

## 2017-01-05 LAB — CSF CULTURE W GRAM STAIN

## 2017-01-05 LAB — CSF CULTURE

## 2017-01-05 LAB — PROTEIN AND GLUCOSE, CSF
Glucose, CSF: 40 mg/dL (ref 40–70)
TOTAL PROTEIN, CSF: 96 mg/dL — AB (ref 15–45)

## 2017-01-05 LAB — MAGNESIUM: MAGNESIUM: 1.9 mg/dL (ref 1.7–2.4)

## 2017-01-05 MED ORDER — MAGNESIUM SULFATE 2 GM/50ML IV SOLN
2.0000 g | Freq: Once | INTRAVENOUS | Status: AC
  Administered 2017-01-05: 2 g via INTRAVENOUS
  Filled 2017-01-05: qty 50

## 2017-01-05 MED ORDER — FENTANYL CITRATE (PF) 100 MCG/2ML IJ SOLN
50.0000 ug | Freq: Once | INTRAMUSCULAR | Status: AC
  Administered 2017-01-06: 50 ug via INTRAVENOUS
  Filled 2017-01-05: qty 2

## 2017-01-05 MED ORDER — KETOROLAC TROMETHAMINE 10 MG PO TABS: 10.0000 mg | ORAL_TABLET | Freq: Three times a day (TID) | ORAL | Status: DC | PRN

## 2017-01-05 MED ORDER — ENSURE ENLIVE PO LIQD
237.0000 mL | Freq: Two times a day (BID) | ORAL | Status: DC
  Administered 2017-01-05 – 2017-01-06 (×2): 237 mL via ORAL

## 2017-01-05 MED ORDER — KETOROLAC TROMETHAMINE 10 MG PO TABS: 20.0000 mg | ORAL_TABLET | Freq: Once | ORAL | Status: DC

## 2017-01-05 MED ORDER — CHLORHEXIDINE GLUCONATE 0.12 % MT SOLN
OROMUCOSAL | Status: AC
  Filled 2017-01-05: qty 15

## 2017-01-05 MED ORDER — ORAL CARE MOUTH RINSE
15.0000 mL | Freq: Two times a day (BID) | OROMUCOSAL | Status: DC
  Administered 2017-01-05 – 2017-01-12 (×14): 15 mL via OROMUCOSAL

## 2017-01-05 NOTE — Progress Notes (Signed)
Occupational Therapy Treatment Patient Details Name: William Pena MRN: [id number] DOB: 1985-09-04 Today's Date: 01/05/2017    History of present illness Patient is a 31 y/o male presents with sepsis secondary to perirectal abscess s/p I&D 5/23 and disseminated cryptococcal infection including meningitis, bacteremia, and likely pneumonia. Intubated 5/28-6/5. 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 Second MRI- extension of the diffusion restriction in the left basal ganglia and right basal ganglia. s/p Lumbar drain 5/30. PMH includes advanced hiv disease   OT comments  This 31 yo male admitted with above not making progress today due to lethargy. Feel pt will benefit from Bil UE soft elbow splints and will follow up next session with these.   Follow Up Recommendations  SNF;Supervision/Assistance - 24 hour;Other (comment) (CIR not option--no support post D/C)    Equipment Recommendations  Other (comment) (TBD at next venue)       Precautions / Restrictions Precautions Precautions: Fall Restrictions Weight Bearing Restrictions: No       Mobility Bed Mobility Overal bed mobility: Needs Assistance Bed Mobility: Supine to Sit;Sit to Supine     Supine to sit: Total assist;+2 for physical assistance Sit to supine: Total assist;+2 for physical assistance   General bed mobility comments: Total A +2 to get to EOB using helicoptor technique and total A +2 to come down on right side and then roll over onto back  Transfers                      Balance Overall balance assessment: Needs assistance Sitting-balance support: Feet supported;No upper extremity supported Sitting balance-Leahy Scale: Zero Sitting balance - Comments: required total A for EOB balance today, no balance reactions. Sat EOB ~8 minutes                                   ADL either performed or assessed with clinical judgement   ADL  Overall ADL's : Needs assistance/impaired                                       General ADL Comments: Today pt is total assistance for ADL participation due to significant flexor tone in Bil UEs (better than yesterday however), decreased trunk control, and decreased interaction today     Vision   Additional Comments: Pt with only intermittent eyes opening today and not following commands for anything          Cognition Arousal/Alertness: Lethargic Behavior During Therapy: Flat affect Overall Cognitive Status: Impaired/Different from baseline Area of Impairment: Following commands                       Following Commands:  (not following 1 step commands today)       General Comments: No verbalizations during therapy session today                   Pertinent Vitals/ Pain       Pain Assessment: Faces Faces Pain Scale: No hurt         Frequency  Min 3X/week        Progress Toward Goals  OT Goals(current goals can now be found in the care plan section)  Progress towards OT goals: Not progressing toward goals - comment (total A for everything today)  Plan Discharge plan needs to be updated    Co-evaluation    PT/OT/SLP Co-Evaluation/Treatment: Yes Reason for Co-Treatment: Complexity of the patient's impairments (multi-system involvement);Necessary to address cognition/behavior during functional activity;For patient/therapist safety   OT goals addressed during session: Strengthening/ROM      AM-PAC PT "6 Clicks" Daily Activity     Outcome Measure   Help from another person eating meals?: Total Help from another person taking care of personal grooming?: Total Help from another person toileting, which includes using toliet, bedpan, or urinal?: Total Help from another person bathing (including washing, rinsing, drying)?: Total Help from another person to put on and taking off regular upper body clothing?: Total Help from  another person to put on and taking off regular lower body clothing?: Total 6 Click Score: 6    End of Session    OT Visit Diagnosis: Muscle weakness (generalized) (M62.81);Cognitive communication deficit (R41.841);Other abnormalities of gait and mobility (R26.89);Pain   Activity Tolerance Patient limited by fatigue;Patient limited by lethargy   Patient Left in bed;with call bell/phone within reach;with family/visitor present;with nursing/sitter in room   Nurse Communication  (Noted drain line in lower back out as we were sitting EOB, RN reconnected it and called MD)        Time: 2841-3244 OT Time Calculation (min): 28 min  Charges: OT General Charges $OT Visit: 1 Procedure OT Treatments $Therapeutic Activity: 8-22 mins Golden [place], OTR/L [phone] 01/05/2017

## 2017-01-05 NOTE — Progress Notes (Signed)
[facility] for Infectious Disease    Date of Admission:  12/20/2016   Total days of antibiotics 16 Day 16L-ampho/flucytosine   ID: William Pena is a 31 y.o. male with advanced hiv disease, CD 4 count of 21/VL1.83M with disseminated Cryptococcal disease including meningitis, bacteremia, and pneumonia.   Principal Problem:   Perirectal abscess Active Problems:   Chronic pain   AIDS (acquired immune deficiency syndrome) (HCC)   Hyponatremia   Normocytic anemia   Elevated LFTs   Pulmonary infiltrates   Hypomagnesemia   Encephalopathy acute   SIRS (systemic inflammatory response syndrome) (HCC)   Acute respiratory failure (HCC)   Rectal abscess   Cryptococcal meningoencephalitis (HCC)   Cryptococcosis (HCC)   Cavitary pneumonia   Diffuse lymphadenopathy   Drug rash   Elevated intracranial pressure   HIV disease (HCC)   Lymphadenopathy of head and neck   HIV (human immunodeficiency virus infection) ([place])   Meningitis   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 Able to communicate to me his chest is hurting.   Medications:  . [START ON 01/06/2017] azithromycin  1,200 mg Oral Weekly  . chlorhexidine gluconate (MEDLINE KIT)  15 mL Mouth Rinse BID  . famotidine  20 mg Oral QHS  . feeding supplement (ENSURE ENLIVE)  237 mL Oral BID BM  . flucytosine  1,500 mg Per Tube Q6H  . heparin subcutaneous  5,000 Units Subcutaneous Q8H  . lidocaine (PF)  5 mL Other Once  . sulfamethoxazole-trimethoprim  10 mL Per Tube Daily    Objective: Vital signs in last 24 hours: Temp:  [97.6 F (36.4 C)-98.7 F (37.1 C)] 98.2 F (36.8 C) (06/07 1148) Pulse Rate:  [48-75] 48 (06/07 1200) Resp:  [10-18] 12 (06/07 1200) BP: (97-150)/(59-101) 148/82 (06/07 1200) SpO2:  [96 %-100 %] 100 % (06/07 1200) Weight:  [134 lb  4.2 oz (60.9 kg)] 134 lb 4.2 oz (60.9 kg) (06/07 0500)  General appearance: alert and in no distress. Sitting in bed quietly. Upper extremity flexion noted.  Eyes: conjunctivae/corneas clear. PERRL, EOM's intact.  Neck: no carotid bruit, no JVD, supple, symmetrical, trachea midline and improved lymphadenopathy. No nuchal rigidity with passive neck ROM Resp: Clear to auscultation Cardio: regular rate and rhythm, S1, S2 normal, no murmur, click, rub or gallop Extremities: extremities normal, atraumatic, no cyanosis or edema Skin: color, texture, turgor normal. No rashes or lesions Lymph nodes: Overall improvement in lymphadenopathy  Neurologic: Alert. Only gross motor movement to UE demonstrated. Speech garbled today and processing is very slow.   Lab Results  Recent Labs  01/04/17 0336 01/05/17 0233  WBC 4.3 2.8*  HGB 9.0* 8.6*  HCT 28.1* 27.1*  NA 134* 130*  K 4.5 4.2  CL 103 101  CO2 23 22  BUN 20 18  CREATININE 1.02 0.94   Liver Panel  Recent Labs  01/04/17 0336 01/05/17 0233  ALBUMIN 2.8* 2.6*   Sedimentation Rate No results for input(s): ESRSEDRATE in the last 72 hours. C-Reactive Protein No results for input(s): CRP in the last 72 hours.  Microbiology: CSF 12/22/16: -WBC 4 -RBC 106 -glucose 44 -protein 72 -Cryptococcal antigen positive with titer greater than or equal to 2560 -culture >> cryptococcus neoformans  CSF 12/25/16: -WBC 29, 75% neutrophils -RBCs 9 -glucose 45 -protein 122 -Cryptococcal antigen positive with titer greater than 2560 -culture >> cryptococcus neoformans  CSF 12/30/16: -WBC 163, 98%  lymph -RBC 2900 -glucose and protein not measured -Cryptococcal antigen positive with titer 1280 -culture >> rare yeast   01/05/17:  -WBC 18, 95% lymph -RBC 23 -glucose 40  -protein 96 -Cryptococcal antigen positive with titer 1280 -culture >> rare yeast on GS  Studies/Results: No results found.   Assessment/Plan:  CM c/b small vessel  infarcts=  - Day 16 of L-ampho and flucytosine - extended induction with continually +CSF cultures - Lumbar drain clamped today, likely D/C tomorrow. Appreciate CSF sample sent today  - Continually needing mag replenishment  - Neuro status continues to improve  HIV Disease = - tx deferred due to CM treatment - OI proph -continue with bactrim ss daily + Azith weekly   Perirectal abscess=  - received a short course of abtx after debridement - wound and follow up care per surgical team   Pneumonia, Cryptococcal =  - 5/28 BAL Cx >> Cryptococcal Neoformans confirmed  - mtB PCR and AFB negative - improving with treatment   Updated mother with the above plan. Overall improvement with CSF properties, still yeast on GS. Will await current sampling to determine treatment changes.  [name], MSN, NP-C [facility] [place] Cell: [phone] Pager: [phone]  01/05/2017, 12:39 PM

## 2017-01-05 NOTE — Progress Notes (Addendum)
Nutrition Follow-up  INTERVENTION:   Ensure Enlive po BID, each supplement provides 350 kcal and 20 grams of protein  NUTRITION DIAGNOSIS:   Increased nutrient needs related to wound healing as evidenced by estimated needs. Ongoing.   GOAL:   Patient will meet greater than or equal to 90% of their needs Progressing.   MONITOR:   PO intake, Supplement acceptance, Labs, Skin  ASSESSMENT:   Pt with PMH of HIV/AIDS (hx of noncompliance in part due to incarceration) and chronic perirectal abscess which pt had I&D Dec 2017, Jan 2018 but has worsened since being in jail for the last 6 weeks also without HIV meds during this time. Pt admitted with perirectal abscess had repeat I&D 5/23. 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 Lumbar drain placed 5/29.    Pt discussed during ICU rounds and with RN.  LP drain clamped 6/5 extubated Labs reviewed: Na 130 (L), PO4 5.6 (H) Diet advanced yesterday afternoon. Pt is slow to respond. Only ate bites with SLP during lunch today and preferred beverages over food per note.   Diet Order:  DIET DYS 2 Room service appropriate? Yes; Fluid consistency: Thin  Skin:   (incision perineum and back, open buttocks wound)  Last BM:  6/6 medium  Height:   Ht Readings from Last 1 Encounters:  12/27/16 5\' 6"  (1.676 m)    Weight:   Wt Readings from Last 1 Encounters:  01/05/17 134 lb 4.2 oz (60.9 kg)    Ideal Body Weight:  59 kg  BMI:  Body mass index is 21.67 kg/m.  Estimated Nutritional Needs:   Kcal:  1800-2000  Protein:  90-110 grams  Fluid:  > 1.8 L/day  EDUCATION NEEDS:   No education needs identified at this time  [place], [place], [place] Pager [phone] After Hours Pager

## 2017-01-05 NOTE — Progress Notes (Signed)
Physical Therapy Treatment Patient Details Name: William Pena MRN: [id number] DOB: Apr 14, 1986 Today's Date: 01/05/2017    History of Present Illness Patient is a 31 y/o male presents with sepsis secondary to perirectal abscess s/p I&D 5/23 and disseminated cryptococcal infection including meningitis, bacteremia, and likely pneumonia. Intubated 5/28-6/5. 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 Second MRI- extension of the diffusion restriction in the left basal ganglia and right basal ganglia. s/p Lumbar drain 5/30. PMH includes advanced hiv disease    PT Comments    Patient lethargic during session today. Pt not following any commands today and no verbalizations. Not sure if this is due to decreased level of arousal which seemed to be inconsistent during session or behavorial reasons? Per nurse, pt verbalizing clearly earlier but has these episodes where he does not respond. Deferred transfer to chair today due to safety reasons as pt not interacting with therapists. Will follow and progress as tolerated.    Follow Up Recommendations  CIR     Equipment Recommendations  Other (comment)    Recommendations for Other Services       Precautions / Restrictions Precautions Precautions: Fall Precaution Comments: lumbar drain Restrictions Weight Bearing Restrictions: No    Mobility  Bed Mobility Overal bed mobility: Needs Assistance Bed Mobility: Supine to Sit;Sit to Supine Rolling: Max assist;+2 for physical assistance   Supine to sit: Total assist;+2 for physical assistance Sit to supine: Total assist;+2 for physical assistance   General bed mobility comments: Total A +2 to get to EOB using helicoptor technique and total A +2 to come down on right side and then roll over onto back. Rolling to left to change pad.  Transfers                    Ambulation/Gait                 Stairs             Wheelchair Mobility    Modified Rankin (Stroke Patients Only) Modified Rankin (Stroke Patients Only) Pre-Morbid Rankin Score: No symptoms Modified Rankin: Severe disability     Balance Overall balance assessment: Needs assistance Sitting-balance support: Feet supported;No upper extremity supported Sitting balance-Leahy Scale: Zero Sitting balance - Comments: required total A for EOB balance today, no balance reactions. Sat EOB ~8 minutes Postural control: Posterior lean                                  Cognition Arousal/Alertness: Lethargic Behavior During Therapy: Flat affect Overall Cognitive Status: Impaired/Different from baseline Area of Impairment: Following commands;Attention                   Current Attention Level: Focused   Following Commands:  (not following any commands today.)       General Comments: No verbalizations during therapy session today and not following commands.       Exercises      General Comments General comments (skin integrity, edema, etc.): Pt with episode of bradycardia again after returning to supine to 50s, RN present anda ware.       Pertinent Vitals/Pain Pain Assessment: Faces Faces Pain Scale: No hurt    Home Living                      Prior Function  PT Goals (current goals can now be found in the care plan section) Progress towards PT goals: Not progressing toward goals - comment (not sure if this is due to lethargy or behavorial)    Frequency    Min 3X/week      PT Plan Current plan remains appropriate    Co-evaluation PT/OT/SLP Co-Evaluation/Treatment: Yes Reason for Co-Treatment: Complexity of the patient's impairments (multi-system involvement);Necessary to address cognition/behavior during functional activity;For patient/therapist safety PT goals addressed during session: Mobility/safety with mobility;Balance;Strengthening/ROM OT goals addressed during session:  Strengthening/ROM      AM-PAC PT "6 Clicks" Daily Activity  Outcome Measure  Difficulty turning over in bed (including adjusting bedclothes, sheets and blankets)?: Total Difficulty moving from lying on back to sitting on the side of the bed? : Total Difficulty sitting down on and standing up from a chair with arms (e.g., wheelchair, bedside commode, etc,.)?: Total Help needed moving to and from a bed to chair (including a wheelchair)?: Total Help needed walking in hospital room?: Total Help needed climbing 3-5 steps with a railing? : Total 6 Click Score: 6    End of Session   Activity Tolerance: Patient limited by lethargy Patient left: in bed;with call bell/phone within reach;with SCD's reapplied Nurse Communication: Mobility status;Need for lift equipment PT Visit Diagnosis: Muscle weakness (generalized) (M62.81);Other symptoms and signs involving the nervous system (R29.898);Hemiplegia and hemiparesis Hemiplegia - Right/Left: Left (both) Hemiplegia - dominant/non-dominant: Non-dominant Hemiplegia - caused by: Cerebral infarction     Time: 3419-6222 PT Time Calculation (min) (ACUTE ONLY): 27 min  Charges:  $Therapeutic Activity: 8-22 mins                    G Codes:       Wray Kearns, PT, DPT [phone]     Marguarite Arbour A Anacleto Batterman 01/05/2017, 3:40 PM

## 2017-01-05 NOTE — Progress Notes (Signed)
  Speech Language Pathology Treatment: Dysphagia;Cognitive-Linquistic  Patient Details Name: William Pena MRN: [id number] DOB: 1985-10-08 Today's Date: 01/05/2017 Time: 6761-9509 SLP Time Calculation (min) (ACUTE ONLY): 11 min  Assessment / Plan / Recommendation Clinical Impression  Pt seen for f/u after initial swallow assessment on previous date. RN reports reduced appetite but without overt signs of difficulty with intake. SLP provided skilled observation during lunch meal. He verbalized his preference for drinks when given a binary choice from therapist. Pt consumed several bites with mildly prolonged bolus formation, but then he stopped opening his mouth for intake. He intermittently responded to yes/no questions to indicate that he did not want additional food. He did have a questionably wet vocal quality upon the end of the session, for which SLP provided Mod cues for a volitional cough to clear. Given this subtle indicator for potential penetration/aspiration and limited intake observed today, will continue to monitor closely.   HPI HPI: Patient is a 31 y/o male presents with sepsis secondary to perirectal abscess s/p I&D 5/23 and disseminated cryptococcal infection including meningitis, bacteremia, and likely pneumonia. Intubated 5/28-6/5. 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 Second MRI- extension of the diffusion restriction in the left basal ganglia and right basal ganglia. s/p Lumbar drain 5/30. PMH includes advanced hiv disease      SLP Plan  Continue with current plan of care       Recommendations  Diet recommendations: Dysphagia 2 (fine chop);Thin liquid Liquids provided via: Cup;Straw Medication Administration: Whole meds with puree Supervision: Staff to assist with self feeding;Full supervision/cueing for compensatory strategies Compensations: Minimize environmental distractions;Slow rate;Small sips/bites;Follow  solids with liquid Postural Changes and/or Swallow Maneuvers: Seated upright 90 degrees;Upright 30-60 min after meal                Oral Care Recommendations: Oral care BID Follow up Recommendations: Inpatient Rehab SLP Visit Diagnosis: Cognitive communication deficit (R41.841);Dysphagia, unspecified (R13.10) Plan: Continue with current plan of care       GO                Germain Osgood 01/05/2017, 2:05 PM  Germain Osgood, M.A. CCC-SLP [phone]

## 2017-01-05 NOTE — Progress Notes (Signed)
At 2300, pt c/o increased pain (in head/back) after giving PRN pain med at 2030. Cannot give any further pain medication until 0230. Notified on-call neurosurgeon as lumbar drain was clamped today - MD stated that w/ all medical problems taking place and if pt's neuro status is maintaining where it was at beginning of shift to keep drain clamped and continue to monitor pt. Suggested notifying attending to assess for alternative pain medication.  Will continue to monitor closely.  Guadalupe Maple, RN

## 2017-01-05 NOTE — Progress Notes (Signed)
Rehab admissions - I met with patient's mom at the bedside.  Patient has not stayed with mom for a long time.  Mom says patient was basically homeless.  Noted patient has been released from custody.  Patient will need SNF placement even after a potential acute inpatient rehab admission.  Mom works and cannot take patient into her home.  I will discuss with rehab team and then follow up again tomorrow.  Call me for questions.  #[phone]

## 2017-01-05 NOTE — Progress Notes (Signed)
Dr. Kathyrn Sheriff came to assess the drain where it came apart earlier today.  He stated it is OK due to the fact that it should come out tomorrow.  Also showed him the new blood drainage that accumulated under the tegaderm.  He stated that this is also OK.  Will continue to monitor pt.

## 2017-01-05 NOTE — Progress Notes (Signed)
Pharmacy Consult:  Electrolyte Replacement  7 yom with advanced HIV disease currently on treatment for disseminated cryptococcal disease with bacteremia and meningitis. Pharmacy consulted to manage electrolytes. SCr back down slightly to 0.94. Continues on saline boluses before and after ambisome dose  K up to 4.2 today without supplementation Mag WNL at 1.9 but at the lower end of goal range   Plan: No potassium needed Magnesium 2gm IV x 1  Monitor K and Mag closely and order additional replacement prn  Salome Arnt, PharmD, BCPS 01/05/2017 8:08 AM

## 2017-01-05 NOTE — Progress Notes (Signed)
Neurology Progress Note  Subjective: No significant 24 hour events. Seen by SLP and PT yesterday. He continue to have limited verbal output. He does not indicate any complaints on ROS but this is limited by his poor attention and abulia. Lumbar drain clamped this morning.   Medications reviewed and reconciled.   Pertinent meds: Amphotericin B 270 mg daily Azithromycin 1200 mg weekly Flucytosine 1500 mg every 6 hours Bactrim 10 mL daily    Current Meds:   Current Facility-Administered Medications:  .  0.9 %  sodium chloride infusion, , Intravenous, Continuous, Elgergawy, Silver Huguenin, MD, Last Rate: 75 mL/hr at 01/05/17 0800 .  acetaminophen (TYLENOL) suppository 650 mg, 650 mg, Rectal, Q6H PRN, Anders Simmonds, MD, 650 mg at 01/03/17 2200 .  amphotericin B liposome (AMBISOME) 270 mg in dextrose 5 % 500 mL IVPB, 4 mg/kg, Intravenous, Q24H, Ricka Burdock, RPH, Stopped at 01/04/17 1533 .  [START ON 01/06/2017] azithromycin (ZITHROMAX) tablet 1,200 mg, 1,200 mg, Oral, Weekly, Byrum, Rose Fillers, MD .  chlorhexidine gluconate (MEDLINE KIT) (PERIDEX) 0.12 % solution 15 mL, 15 mL, Mouth Rinse, BID, Halford Chessman, Vineet, MD, 15 mL at 01/05/17 0758 .  [DISCONTINUED] diphenhydrAMINE (BENADRYL) capsule 25 mg, 25 mg, Oral, Q6H PRN, 25 mg at 12/24/16 0315 **OR** diphenhydrAMINE (BENADRYL) injection 25 mg, 25 mg, Intravenous, Q6H PRN, Rayburn, Kelly A, PA-C, 25 mg at 12/22/16 2229 .  docusate sodium (COLACE) capsule 100 mg, 100 mg, Oral, BID PRN, Collene Gobble, MD .  famotidine (PEPCID) tablet 20 mg, 20 mg, Oral, QHS, Byrum, Rose Fillers, MD, 20 mg at 01/05/17 0003 .  flucytosine (ANCOBON) capsule 1,500 mg, 1,500 mg, Per Tube, Q6H, Chesley Mires, MD, 1,500 mg at 01/05/17 0604 .  heparin injection 5,000 Units, 5,000 Units, Subcutaneous, Q8H, Javier Glazier, MD, 5,000 Units at 01/05/17 0604 .  lidocaine (PF) (XYLOCAINE) 1 % injection 5 mL, 5 mL, Other, Once, Dixon, Stephanie N, NP .  magnesium sulfate IVPB 2 g 50 mL,  2 g, Intravenous, Once, Rumbarger, Valeda Malm, RPH, Last Rate: 50 mL/hr at 01/05/17 0917, 2 g at 01/05/17 0917 .  [DISCONTINUED] ondansetron (ZOFRAN-ODT) disintegrating tablet 4 mg, 4 mg, Oral, Q6H PRN **OR** ondansetron (ZOFRAN) injection 4 mg, 4 mg, Intravenous, Q6H PRN, Rayburn, Kelly A, PA-C, 4 mg at 12/22/16 1939 .  polyethylene glycol (MIRALAX / GLYCOLAX) packet 17 g, 17 g, Oral, Daily PRN, Rayburn, Kelly A, PA-C .  sodium chloride 0.9 % bolus 500 mL, 500 mL, Intravenous, Q24H, Nestor, Sonia Baller, MD, Last Rate: 500 mL/hr at 01/05/17 0917, 500 mL at 01/05/17 0917 .  sodium chloride 0.9 % bolus 500 mL, 500 mL, Intravenous, Q24H, Nestor, Sonia Baller, MD, Stopped at 01/04/17 1630 .  sulfamethoxazole-trimethoprim (BACTRIM,SEPTRA) 200-40 MG/5ML suspension 10 mL, 10 mL, Per Tube, Daily, Arab Callas, NP, 10 mL at 01/05/17 0917 .  traMADol (ULTRAM) tablet 50 mg, 50 mg, Oral, Q6H PRN, Javier Glazier, MD, 50 mg at 01/04/17 2129  Objective:  Temp:  [97.2 F (36.2 C)-98.7 F (37.1 C)] 98.3 F (36.8 C) (06/07 0800) Pulse Rate:  [54-77] 68 (06/07 0800) Resp:  [10-19] 11 (06/07 0800) BP: (97-150)/(59-101) 117/74 (06/07 0800) SpO2:  [96 %-100 %] 100 % (06/07 0800) Weight:  [60.9 kg (134 lb 4.2 oz)] 60.9 kg (134 lb 4.2 oz) (06/07 0500)  General: WDWN AA man lying in ICU bed. [name] alert and clearly abulic. He fixes and tracks, attending to both sides without evidence of overt hemineglect. He continues to  show impairments in both speed of processing and sustained attention. He would very weakly nod and shake his head in response to questions but did not verbalize for me. He followed some simple commands with delay.  HEENT: Neck supple. No LAD. Sclerae are anicteric. There is no conjunctival injection.  CV: Regular, no murmur. Carotid pulses are 2+ and symmetric with no bruits. Distal pulses 2+ and symmetric.  Lungs: CTAB on anterior exam.   Extremities: No C/C/E. Numerous tattoos noted over body  and face.  Neuro: MS: As noted above.  CN: Pupils are equal and reactive from 3-->2 mm bilaterally. He blinks to visual threat. Eyes are conjugate with no forced deviation and no nystagmus. Volitional horizontal saccades appear intact. Corneals are present. His face appears grossly symmetric at rest with good eye closure and symmetric smile. The remainder of his cranial nerve exam is limited as he does note participate.  Motor: Normal bulk. Tone is increased in both arms which are flexed against his trunk. Grips are 4+/5 bilaterally. He does not otherwise participate with confrontational strength testing. No tremor or other abnormal movements are observed.  Sensation: Withdrawal noted to nailbed pressure x4.  DTRs: 3+, symmetric. Toes are mute bilaterally.  Coordination/gait: These cannot be assessed as he does not participate with the exam.   Labs: Lab Results  Component Value Date   WBC 2.8 (L) 01/05/2017   HGB 8.6 (L) 01/05/2017   HCT 27.1 (L) 01/05/2017   PLT 255 01/05/2017   GLUCOSE 89 01/05/2017   TRIG 144 12/26/2016   ALT 42 12/28/2016   AST 30 12/28/2016   NA 130 (L) 01/05/2017   K 4.2 01/05/2017   CL 101 01/05/2017   CREATININE 0.94 01/05/2017   BUN 18 01/05/2017   CO2 22 01/05/2017   INR 1.20 12/22/2016   CBC Latest Ref Rng & Units 01/05/2017 01/04/2017 01/03/2017  WBC 4.0 - 10.5 K/uL 2.8(L) 4.3 5.6  Hemoglobin 13.0 - 17.0 g/dL 8.6(L) 9.0(L) 7.9(L)  Hematocrit 39.0 - 52.0 % 27.1(L) 28.1(L) 24.6(L)  Platelets 150 - 400 K/uL 255 275 257    No results found for: HGBA1C Lab Results  Component Value Date   ALT 42 12/28/2016   AST 30 12/28/2016   ALKPHOS 135 (H) 12/28/2016   BILITOT 0.4 12/28/2016   RPR nonreactive HIV viral load 1,550,000 CD4 count 21   From 12/22/16: CSF WBC 4 CSF RBC 106 CSF glucose 44 CSF protein 72 Cryptococcal antigen positive with titer greater than or equal to 2560 CSF culture positive for cryptococcus neoformans  From 12/25/16: CSF WBC  29, 75% neutrophils CSF RBCs 9 CSF glucose 45 CSF protein 122 Cryptococcal antigen positive with titer greater than 2560 CSF culture positive for cryptococcus neoformans  From 12/30/16: CSF WBC 163, 98% mononuclear CSF RBC 2900 CSF glucose and protein not measured Cryptococcal antigen positive with titer 1280 CSF culture no growth to date  Radiology:  There is no new neuroimaging for review.   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 Continue amphotericin B and flucytosine as per ID recs. Lumbar drain in place, clamped this morning, may be d/c'd tomorrow if he does well today. Appreciate neurosurgery and ID assistance.   2. Acute ischemic stroke: This is acute, due to small vessel involvement from his cryptococcal infection. Continue with antifungal therapy as above. No role for antiplatelet agents or statins in this setting.  3. Acute encephalopathy: This is multifactorial in etiology with contributions  from cryptococcal meningitis and acute strokes with additional contributions likely from metabolic derangements, medication effect, and respiratory failure. Overall he has improved but now has clear abulia and cognitive dysfunction (impairments in sustained attention, speed of processing, and executive function) that are consistent with his L basal ganglia infarct. Continue supportive care. Continue to optimize metabolic status as you are. Continue to treat underlying infections as you are. Minimize sedation as tolerated. Avoid anything with strong anticholinergic properties that this is likely to worsen his encephalopathy. If speed of processing, attention, and abulia persist, may consider a trial of a neurostimulant such as amantadine. Appreciate SLP assistance.   4. 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 CD4 count this admission is 21 with high viral load greater than 1 million. Currently not receiving antiretroviral therapy in the setting of  cryptococcal treatment. Appreciate ID assistance.  This was discussed with the patient's mother at the bedside. Education was provided on the diagnosis and expected evaluation and treatment. She is in agreement with the plan as noted. She was given the opportunity to ask any questions and these were addressed to her satisfaction.    Melba Coon, MD [facility]

## 2017-01-05 NOTE — Progress Notes (Signed)
Stopped by to visit with pt's mom in rm after consulted with nurse, who said he had some improvement and was not on rm air. Pt's mom, a woman of great faith, really appreciated the visit and prayer, and is so grateful for the prayer shawl she still has laid over her son. She said she'd also found the chapel and taken comfort in conversations she had with others while there. She was grateful for the visit and encouragement.  I was also briefly able to offer same to her son, as he made eye contact with me while sitting up in bed somewhat.Bonney Roussel available for f/u.   01/05/17 1100  Clinical Encounter Type  Visited With Patient and family together;Health care provider  Visit Type Follow-up;Psychological support;Spiritual support;Social support;Critical Care  Referral From Chaplain  Spiritual Encounters  Spiritual Needs Prayer;Emotional  Stress Factors  Patient Stress Factors Health changes;Loss of control  Family Stress Factors Family relationships;Health changes;Loss of control   Gerrit Heck, Chaplain

## 2017-01-05 NOTE — Progress Notes (Signed)
CRITICAL VALUE ALERT  Critical Value:  CSF WBC of 18 + crypt. Meningitis.   Date & Time Notied: 01/05/2017 @ 1230  Provider Notified: [name], NP  Orders Received/Actions taken: N/A Will continue to monitor.

## 2017-01-05 NOTE — Progress Notes (Signed)
No issues overnight. Remains stable: currently sleeping but easily arousable. FC. Drain in place, functioning well ~10cc q2.  - CSF sampled today per ID request - Clamp LD today. If neurologically stable will d/c drain tomorrow

## 2017-01-05 NOTE — Progress Notes (Signed)
Pt was sitting EOB with PT/OT.  Drain noted to be disconnected at this time.  Pt did drain some onto pad.  RN was able to reconnect without difficulty and called Dr. Kathyrn Sheriff to notify him of this.  He will come to assess it.  Drain temporarily fixed at the moment although we clamped the drain at 0845 in hopes to DC it tomorrow per MD.  Will continue to monitor pt.

## 2017-01-06 ENCOUNTER — Inpatient Hospital Stay (HOSPITAL_COMMUNITY): Payer: Medicaid Other

## 2017-01-06 LAB — CBC WITH DIFFERENTIAL/PLATELET
BASOS PCT: 1 %
Basophils Absolute: 0 10*3/uL (ref 0.0–0.1)
EOS PCT: 0 %
Eosinophils Absolute: 0 10*3/uL (ref 0.0–0.7)
HCT: 29.9 % — ABNORMAL LOW (ref 39.0–52.0)
HEMOGLOBIN: 9.4 g/dL — AB (ref 13.0–17.0)
Lymphocytes Relative: 16 %
Lymphs Abs: 0.7 10*3/uL (ref 0.7–4.0)
MCH: 27.1 pg (ref 26.0–34.0)
MCHC: 31.4 g/dL (ref 30.0–36.0)
MCV: 86.2 fL (ref 78.0–100.0)
Monocytes Absolute: 0.4 10*3/uL (ref 0.1–1.0)
Monocytes Relative: 8 %
NEUTROS PCT: 75 %
Neutro Abs: 3.5 10*3/uL (ref 1.7–7.7)
PLATELETS: 285 10*3/uL (ref 150–400)
RBC: 3.47 MIL/uL — AB (ref 4.22–5.81)
RDW: 16.7 % — ABNORMAL HIGH (ref 11.5–15.5)
WBC: 4.7 10*3/uL (ref 4.0–10.5)

## 2017-01-06 LAB — SODIUM, URINE, RANDOM: SODIUM UR: 91 mmol/L

## 2017-01-06 LAB — APTT: APTT: 41 s — AB (ref 24–36)

## 2017-01-06 LAB — RENAL FUNCTION PANEL
ALBUMIN: 3 g/dL — AB (ref 3.5–5.0)
ANION GAP: 8 (ref 5–15)
BUN: 19 mg/dL (ref 6–20)
CO2: 23 mmol/L (ref 22–32)
CREATININE: 1.07 mg/dL (ref 0.61–1.24)
Calcium: 9.6 mg/dL (ref 8.9–10.3)
Chloride: 98 mmol/L — ABNORMAL LOW (ref 101–111)
GFR calc non Af Amer: >60 mL/min (ref >60)
GLUCOSE: 98 mg/dL (ref 65–99)
PHOSPHORUS: 5.6 mg/dL — AB (ref 2.5–4.6)
Potassium: 4.3 mmol/L (ref 3.5–5.1)
SODIUM: 129 mmol/L — AB (ref 135–145)

## 2017-01-06 LAB — PROTIME-INR
INR: 1.09
PROTHROMBIN TIME: 14.1 s (ref 11.4–15.2)

## 2017-01-06 LAB — PATHOLOGIST SMEAR REVIEW: Path Review: INCREASED

## 2017-01-06 LAB — OSMOLALITY: Osmolality: 291 mOsm/kg (ref 275–295)

## 2017-01-06 LAB — MAGNESIUM: Magnesium: 1.7 mg/dL (ref 1.7–2.4)

## 2017-01-06 LAB — GLUCOSE, CAPILLARY: GLUCOSE-CAPILLARY: 103 mg/dL — AB (ref 65–99)

## 2017-01-06 LAB — OSMOLALITY, URINE: Osmolality, Ur: 332 mOsm/kg (ref 300–900)

## 2017-01-06 MED ORDER — MAGNESIUM SULFATE 4 GM/100ML IV SOLN
4.0000 g | Freq: Once | INTRAVENOUS | Status: AC
  Administered 2017-01-06: 4 g via INTRAVENOUS
  Filled 2017-01-06: qty 100

## 2017-01-06 MED ORDER — DEXTROSE 5 % IV SOLN
4.0000 mg/kg | INTRAVENOUS | Status: DC
  Administered 2017-01-06 – 2017-01-11 (×6): 240 mg via INTRAVENOUS
  Filled 2017-01-06 (×8): qty 240

## 2017-01-06 NOTE — Progress Notes (Signed)
OT Cancellation Note  Patient Details Name: William Pena MRN: [id number] DOB: [birth year]   Cancelled Treatment:    Reason Eval/Treat Not Completed: Medical issues which prohibited therapy. Nursing on way with pt to CT.  Almon Register [phone] 01/06/2017, 12:27 PM

## 2017-01-06 NOTE — Progress Notes (Signed)
Occupational Therapy Treatment Patient Details Name: William Pena MRN: [id number] DOB: [birth year] Today's Date: 01/06/2017    History of present illness Patient is a 31 y/o male presents with sepsis secondary to perirectal abscess s/p I&D 5/23 and disseminated cryptococcal infection including meningitis, bacteremia, and likely pneumonia. Intubated 5/28-6/5. 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 Second MRI- extension of the diffusion restriction in the left basal ganglia and right basal ganglia. s/p Lumbar drain 5/30. PMH includes advanced hiv disease   OT comments  This 31 yo male admitted with above seen today for assessment and application of Bil soft elbow splints. Nursing educated on how to don splints and to check for pressure areas every shift. Pt will continue to benefit from acute OT with follow up OT at SNF  Follow Up Recommendations  SNF;Supervision/Assistance - 24 hour;Other (comment)          Precautions / Restrictions Precautions Precautions: Fall Precaution Comments: lumbar drain Restrictions Weight Bearing Restrictions: No              ADL either performed or assessed with clinical judgement                        Exercises Other Exercises Other Exercises: Pt seen for Bil soft elbow wear assessment. Had to remove BP cuff from RUE due to this could not well with soft elbow splint. I was concerned about the IV placement in his left UE and use of the Soft elbow splints but the nurse Jerene Pitch) was able to pad where IV contector was so that it would no be pressing into his skin.                        Progress Toward Goals  OT Goals(current goals can now be found in the care plan section)     ADL Goals Additional ADL Goal #4: Pt will tolerate wearing of Bil UE soft splints to help prevent contractures  Plan Discharge plan remains appropriate       AM-PAC PT "6 Clicks" Daily Activity      Outcome Measure   Help from another person eating meals?: Total Help from another person taking care of personal grooming?: Total Help from another person toileting, which includes using toliet, bedpan, or urinal?: Total Help from another person bathing (including washing, rinsing, drying)?: Total Help from another person to put on and taking off regular upper body clothing?: Total Help from another person to put on and taking off regular lower body clothing?: Total 6 Click Score: 6    End of Session    OT Visit Diagnosis: Muscle weakness (generalized) (M62.81);Cognitive communication deficit (R41.841);Other abnormalities of gait and mobility (R26.89);Pain   Activity Tolerance Patient limited by lethargy   Patient Left in bed   Nurse Communication  (Soft elbow splints can be left on all the time (take off once a shift to assess skin); make sure they are put on so that where they velcro is not in line with the natural tonal pull of his arm; if they get soiled then can be wiped down but not washed and if they get wet they can be laid flat to dry)        Time: 1357-1425 OT Time Calculation (min): 28 min  Charges: OT General Charges $OT Visit: 1 Procedure OT Treatments $Orthotics Fit/Training: 23-37 mins Golden Circle, OTR/L [phone] 01/06/2017

## 2017-01-06 NOTE — Progress Notes (Signed)
Contacted Dr. Kathyrn Sheriff re: pt remaining drowsy and lethargic throughout shift. Lumbar drain working well. Requested that this RN page neuro. Dr. Shon Hale aware pt neuro status waxes and wanes, and states this is expected given pt diagnosis. Will continue to closely monitor and notify of any changes.

## 2017-01-06 NOTE — Progress Notes (Signed)
Neurology Progress Note  Subjective: He had some projectile vomiting earlier today. His lumbar drain had been clamped since yesterday and this was unclamped following this episode of vomiting. Overall neurologic status is largely unchanged. The patient is nonverbal for me today so does not participate with the review of systems.  Medications reviewed and reconciled.   Pertinent meds: Amphotericin B 240 mg daily Azithromycin 1200 mg weekly Flucytosine 1500 mg every 6 hours Bactrim 10 mL daily    Current Meds:   Current Facility-Administered Medications:  .  0.9 %  sodium chloride infusion, , Intravenous, Continuous, Raylene Miyamoto, MD, Last Rate: 50 mL/hr at 01/06/17 0913 .  acetaminophen (TYLENOL) suppository 650 mg, 650 mg, Rectal, Q6H PRN, Anders Simmonds, MD, 650 mg at 01/03/17 2200 .  amphotericin B liposome (AMBISOME) 240 mg in dextrose 5 % 500 mL IVPB, 4 mg/kg, Intravenous, Q24H, Rumbarger, Valeda Malm, RPH, Stopped at 01/06/17 1245 .  azithromycin (ZITHROMAX) tablet 1,200 mg, 1,200 mg, Oral, Weekly, Collene Gobble, MD, 1,200 mg at 01/06/17 0943 .  [DISCONTINUED] diphenhydrAMINE (BENADRYL) capsule 25 mg, 25 mg, Oral, Q6H PRN, 25 mg at 12/24/16 0315 **OR** diphenhydrAMINE (BENADRYL) injection 25 mg, 25 mg, Intravenous, Q6H PRN, Rayburn, Kelly A, PA-C, 25 mg at 12/22/16 2229 .  docusate sodium (COLACE) capsule 100 mg, 100 mg, Oral, BID PRN, Collene Gobble, MD .  famotidine (PEPCID) tablet 20 mg, 20 mg, Oral, QHS, Byrum, Rose Fillers, MD, 20 mg at 01/05/17 2236 .  feeding supplement (ENSURE ENLIVE) (ENSURE ENLIVE) liquid 237 mL, 237 mL, Oral, BID BM, Raylene Miyamoto, MD, 237 mL at 01/06/17 [phone] .  flucytosine (ANCOBON) capsule 1,500 mg, 1,500 mg, Per Tube, Q6H, Sood, Vineet, MD, 1,500 mg at 01/06/17 0027 .  heparin injection 5,000 Units, 5,000 Units, Subcutaneous, Q8H, Javier Glazier, MD, 5,000 Units at 01/06/17 0555 .  lidocaine (PF) (XYLOCAINE) 1 % injection 5 mL, 5 mL, Other,  Once, Dixon, Melton Krebs, NP .  MEDLINE mouth rinse, 15 mL, Mouth Rinse, BID, Collene Gobble, MD, 15 mL at 01/06/17 0950 .  [DISCONTINUED] ondansetron (ZOFRAN-ODT) disintegrating tablet 4 mg, 4 mg, Oral, Q6H PRN **OR** ondansetron (ZOFRAN) injection 4 mg, 4 mg, Intravenous, Q6H PRN, Rayburn, Kelly A, PA-C, 4 mg at 12/22/16 1939 .  polyethylene glycol (MIRALAX / GLYCOLAX) packet 17 g, 17 g, Oral, Daily PRN, Rayburn, Kelly A, PA-C .  sodium chloride 0.9 % bolus 500 mL, 500 mL, Intravenous, Q24H, Nestor, Sonia Baller, MD, Stopped at 01/06/17 1031 .  sodium chloride 0.9 % bolus 500 mL, 500 mL, Intravenous, Q24H, Nestor, Sonia Baller, MD, Last Rate: 500 mL/hr at 01/06/17 1300, 500 mL at 01/06/17 1300 .  sulfamethoxazole-trimethoprim (BACTRIM,SEPTRA) 200-40 MG/5ML suspension 10 mL, 10 mL, Per Tube, Daily, Dixon, Melton Krebs, NP, 10 mL at 01/06/17 0938 .  traMADol (ULTRAM) tablet 50 mg, 50 mg, Oral, Q6H PRN, Javier Glazier, MD, 50 mg at 01/05/17 2030  Objective:  Temp:  [97.8 F (36.6 C)-99.1 F (37.3 C)] 98.7 F (37.1 C) (06/08 1159) Pulse Rate:  [50-93] 56 (06/08 1300) Resp:  [8-19] 12 (06/08 1300) BP: (103-152)/(62-114) 148/90 (06/08 1300) SpO2:  [99 %-100 %] 100 % (06/08 1300) Weight:  [59.8 kg (131 lb 13.4 oz)] 59.8 kg (131 lb 13.4 oz) (06/08 0430)  General: WDWN AA man lying in ICU bed. [name] alert and abulic. He fixes and tracks. He really did not attempt to communicate with me today. He had no attempts at vocalization. He  moved his right foot to command but I couldn't get him to do anything else for me today.  HEENT: Neck supple. No LAD. Sclerae are anicteric. There is no conjunctival injection.  CV: Regular, no murmur. Carotid pulses are 2+ and symmetric with no bruits. Distal pulses 2+ and symmetric.  Lungs: CTAB on anterior exam.   Extremities: No C/C/E. Numerous tattoos noted over body and face.  Neuro: MS: As noted above.  CN: Pupils are equal and reactive from 3-->2 mm bilaterally.  He blinks to visual threat. Eyes are conjugate with no forced deviation and no nystagmus. Volitional horizontal saccades appear intact. Corneals are present. His face appears grossly symmetric at rest with good eye closure and symmetric smile. The remainder of his cranial nerve exam is limited as he does note participate.  Motor: Normal bulk. Tone is increased in both arms which are flexed against his trunk. No tremor or other abnormal movements are observed.  Sensation: Withdrawal noted to nailbed pressure x4.  DTRs: 3+, symmetric. Toes are mute bilaterally.  Coordination/gait: These cannot be assessed as he does not participate with the exam.   Labs: Lab Results  Component Value Date   WBC 4.7 01/06/2017   HGB 9.4 (L) 01/06/2017   HCT 29.9 (L) 01/06/2017   PLT 285 01/06/2017   GLUCOSE 98 01/06/2017   TRIG 144 12/26/2016   ALT 42 12/28/2016   AST 30 12/28/2016   NA 129 (L) 01/06/2017   K 4.3 01/06/2017   CL 98 (L) 01/06/2017   CREATININE 1.07 01/06/2017   BUN 19 01/06/2017   CO2 23 01/06/2017   INR 1.20 12/22/2016   CBC Latest Ref Rng & Units 01/06/2017 01/05/2017 01/04/2017  WBC 4.0 - 10.5 K/uL 4.7 2.8(L) 4.3  Hemoglobin 13.0 - 17.0 g/dL 9.4(L) 8.6(L) 9.0(L)  Hematocrit 39.0 - 52.0 % 29.9(L) 27.1(L) 28.1(L)  Platelets 150 - 400 K/uL 285 255 275    No results found for: HGBA1C Lab Results  Component Value Date   ALT 42 12/28/2016   AST 30 12/28/2016   ALKPHOS 135 (H) 12/28/2016   BILITOT 0.4 12/28/2016   RPR nonreactive HIV viral load 1,550,000 CD4 count 21   From 12/22/16: CSF WBC 4 CSF RBC 106 CSF glucose 44 CSF protein 72 Cryptococcal antigen positive with titer greater than or equal to 2560 CSF culture positive for cryptococcus neoformans  From 12/25/16: CSF WBC 29, 75% neutrophils CSF RBCs 9 CSF glucose 45 CSF protein 122 Cryptococcal antigen positive with titer greater than 2560 CSF culture positive for cryptococcus neoformans  From 12/30/16: CSF WBC 163,  98% mononuclear CSF RBC 2900 CSF glucose and protein not measured Cryptococcal antigen positive with titer 1280 CSF culture no growth to date  Radiology:  There is no new neuroimaging for review.   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 Continue amphotericin B and flucytosine as per ID recs. Lumbar drain in place, unclamped this morning following an episode of vomiting. Appreciate neurosurgery and ID assistance.   2. Acute ischemic stroke: This is acute, due to small vessel involvement from his cryptococcal infection. Continue with antifungal therapy as above. No role for antiplatelet agents or statins in this setting.  3. Acute encephalopathy: This is multifactorial in etiology with contributions from cryptococcal meningitis and acute strokes with additional contributions likely from metabolic derangements, medication effect, and respiratory failure. Overall he has improved but now has clear abulia and cognitive dysfunction (impairments in sustained attention, speed  of processing, and executive function) that are consistent with his L basal ganglia infarct. Continue supportive care. Continue to optimize metabolic status as you are. Continue to treat underlying infections as you are. Minimize sedation as tolerated. Avoid anything with strong anticholinergic properties that this is likely to worsen his encephalopathy. If speed of processing, attention, and abulia persist, may consider a trial of a neurostimulant such as amantadine. Appreciate SLP assistance.   4. 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 CD4 count this admission is 21 with high viral load greater than 1 million. Currently not receiving antiretroviral therapy in the setting of cryptococcal treatment. Appreciate ID assistance.   No family was present at the time of my visit today.  Melba Coon, MD [facility]

## 2017-01-06 NOTE — Progress Notes (Signed)
Physical Therapy Treatment Patient Details Name: William Pena MRN: [id number] DOB: 1986/03/23 Today's Date: 01/06/2017    History of Present Illness Patient is a 31 y/o male presents with sepsis secondary to perirectal abscess s/p I&D 5/23 and disseminated cryptococcal infection including meningitis, bacteremia, and likely pneumonia. Intubated 5/28-6/5. 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 Second MRI- extension of the diffusion restriction in the left basal ganglia and right basal ganglia. s/p Lumbar drain 5/30. PMH includes advanced hiv disease    PT Comments    Emphasis placed on getting participation from pt and sitting EOB working on truncal activation with various strategies including startle response.   Follow Up Recommendations  CIR     Equipment Recommendations  Other (comment) (TBA next venue)    Recommendations for Other Services Rehab consult     Precautions / Restrictions Precautions Precautions: Fall Precaution Comments: lumbar drain Required Braces or Orthoses: Other Brace/Splint (has 2 new soft elbow splints to help keep elbows extended.) Restrictions Weight Bearing Restrictions: No    Mobility  Bed Mobility Overal bed mobility: Needs Assistance Bed Mobility: Supine to Sit;Sit to Supine     Supine to sit: Total assist;+2 for physical assistance Sit to supine: Total assist;+2 for physical assistance   General bed mobility comments: No much participation today.  Transfers                 General transfer comment: not appropriate to try standing due to lack of participation.  Ambulation/Gait                 Stairs            Wheelchair Mobility    Modified Rankin (Stroke Patients Only) Modified Rankin (Stroke Patients Only) Pre-Morbid Rankin Score: No symptoms Modified Rankin: Severe disability     Balance Overall balance assessment: Needs  assistance Sitting-balance support: Feet supported;Single extremity supported;No upper extremity supported Sitting balance-Leahy Scale: Zero Sitting balance - Comments: Spent 10 min working on truncal activation without success.  Little interaction noted with patient.                                    Cognition Arousal/Alertness: Lethargic Behavior During Therapy: Flat affect Overall Cognitive Status: Impaired/Different from baseline Area of Impairment: Orientation;Attention;Memory;Following commands;Safety/judgement;Awareness;Problem solving                 Orientation Level: Time;Situation;Place Current Attention Level: Focused   Following Commands: Follows one step commands inconsistently Safety/Judgement: Decreased awareness of deficits;Decreased awareness of safety Awareness: Intellectual Problem Solving: Slow processing;Decreased initiation;Difficulty sequencing General Comments: No verbalizations, no following instructions, no eye contact.  Pt      Exercises Other Exercises Other Exercises: Pt seen for Bil soft elbow wear assessment. Had to remove BP cuff from RUE due to this could not well with soft elbow splint. I was concerned about the IV placement in his left UE and use of the Soft elbow splints but the nurse Jerene Pitch) was able to pad where IV contector was so that it would no be pressing into his skin.      General Comments General comments (skin integrity, edema, etc.): BP sitting 171/126, BP once returned to supine 150/84      Pertinent Vitals/Pain Pain Assessment: Faces Faces Pain Scale: No hurt    Home Living  Prior Function            PT Goals (current goals can now be found in the care plan section) Progress towards PT goals: Not progressing toward goals - comment (pt not yet able to participate actively)    Frequency    Min 3X/week      PT Plan Current plan remains appropriate     Co-evaluation              AM-PAC PT "6 Clicks" Daily Activity  Outcome Measure  Difficulty turning over in bed (including adjusting bedclothes, sheets and blankets)?: Total Difficulty moving from lying on back to sitting on the side of the bed? : Total Difficulty sitting down on and standing up from a chair with arms (e.g., wheelchair, bedside commode, etc,.)?: Total Help needed moving to and from a bed to chair (including a wheelchair)?: Total Help needed walking in hospital room?: Total Help needed climbing 3-5 steps with a railing? : Total 6 Click Score: 6    End of Session   Activity Tolerance: Other (comment);Patient limited by lethargy (limited by lack of patient response.) Patient left: in bed;with call bell/phone within reach;with SCD's reapplied Nurse Communication: Mobility status;Need for lift equipment PT Visit Diagnosis: Muscle weakness (generalized) (M62.81);Other symptoms and signs involving the nervous system (R29.898);Hemiplegia and hemiparesis Hemiplegia - Right/Left: Left Hemiplegia - dominant/non-dominant: Non-dominant Hemiplegia - caused by: Cerebral infarction     Time: 4193-7902 PT Time Calculation (min) (ACUTE ONLY): 29 min  Charges:  $Therapeutic Activity: 8-22 mins $Neuromuscular Re-education: 8-22 mins                    G Codes:       02/01/2017  Donnella Sham, PT [phone]  (pager)   Tessie Fass Tavis Kring 02/01/17, 5:27 PM

## 2017-01-06 NOTE — Care Management Note (Signed)
Case Management Note Previous Note Created by Carles Collet  Patient Details  Name: William Pena MRN: [id number] Date of Birth: May 12, 1986  Subjective/Objective:                 Per previous notes, patient is no longer in custody. 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 Patient extubated 6/5. Multiple antiinfectives. Neuro status not at baseline, per nursing report team may consider cortrak in next few days.   Action/Plan:  CM will continue to follow. Patient's support has been mother.  Expected Discharge Date:  12/22/16               Expected Discharge Plan:  [employer]  In-House Referral:     Discharge planning Services  CM Consult  Post Acute Care Choice:    Choice offered to:     DME Arranged:    DME Agency:     HH Arranged:    [place] Agency:     Status of Service:  In process, will continue to follow  If discussed at Long Length of Stay Meetings, dates discussed:    Additional Comments: 01/06/2017 CIR recommended and following - CSW consulted for back up plan Maryclare Labrador, RN 01/06/2017, 9:32 AM

## 2017-01-06 NOTE — Progress Notes (Signed)
Attempting to administer pt's 0600 medications when pt began projectile vomiting. (PRN zofran given) Concerned this was a possible neuro change - maybe increased ICP r/t clamping the lumbar drain? Pt neuro status otherwise was difficult to assess - he has wax/waned since extubation and RN/MD's all aware of that, but at this time will still open eyes to stimulation but would not tell RN his name and difficult to get hime to wiggle toes. (did so w/ much stimulation).   Therefore, notified on-call neurosurgeon to discuss pt situation. Upon discussion and assessing reason for placing drain initially (while on phone w/ MD), MD decided to have RN unclamp lumbar drain at this time until rounding takes place on AM shift. Was also informed via MD to place top of drainage bag at level of pt's head for placement - no set amount per hr, etc. Was given.   Pt is currently resting comfortably and opening eyes to voice. Will continue to monitor closely.  Guadalupe Maple, RN

## 2017-01-06 NOTE — Progress Notes (Signed)
Pharmacy Antibiotic Note  William Pena is a 31 y.o. 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  Pharmacy has been consulted for liposomal amphotericin b dosing. Pt with Tmax 99.1 and WBC WNL but cultures remain positive. Pt has lost weight since admission so dose will need to be adjusted.   Plan: Change Ambisome to 240 mg (~4mg /kg) IV q24h d/t patient weight loss Continue flucytosine 1500 mg PO Q6h Continue pre and post 500 mL sodium chloride boluses Monitor renal function, electrolytes and response to therapy Supplement Mg 4gm IV x 1  Height: 5\' 6"  (167.6 cm) Weight: 131 lb 13.4 oz (59.8 kg) IBW/kg (Calculated) : 63.8  Temp (24hrs), Avg:98.3 F (36.8 C), Min:97.8 F (36.6 C), Max:99.1 F (37.3 C)   Recent Labs Lab 01/02/17 0210 01/03/17 0221 01/04/17 0336 01/05/17 0233 01/06/17 0332  WBC 8.1 5.6 4.3 2.8* 4.7  CREATININE 0.84 0.91 1.02 0.94 1.07    Estimated Creatinine Clearance: 84.6 mL/min (by C-G formula based on SCr of 1.07 mg/dL).    Allergies  Allergen Reactions  . Augmentin [Amoxicillin-Pot Clavulanate]     "break out"    Antimicrobials this admission: Clinda 5/22 x1 Cefepime 5/22 >> 5/23 Vanc 5/22 >> 5/24 Flagyl 5/22 >> 5/24  Doxy 5/54 >>5/25 Augmentin 5/24 >>5/25 Ambisome 5/24 >> Flucytosine 5/24 >> Bactrim 5/24 >> CTX 5/25 >>5/30 Azithro 1200 weekly > Valtrex 1g BID >6/3  Dose adjustments this admission: 6/1 - Ambisome increased to 4 mg/kg IV q24h 6/8 - Ambisome adjusted due to change to weight, still 4mg /kg  Microbiology results: 5/22 BCx: 2/2 Cryptococcus neoformans 5/23 BCx: 2/2 Cryptococcus neoformans 5/23 Perirectal abscess: multiple species 5/24 Crypto Ag: positive 5/24 CSF: abundant Cryptococcus neoformans 5/27 CSF: Gstain: yeast, cx ngtd 5/27 CSF fungal: ngtd 5/28 PCP: neg 5/28 AFB: neg 5/30 EPP:IRJJ 5/31 CDiff - NEG 5/31 GI panel: neg 5/31 Stool Cx: No salmonella or shigella recovered 6/1 Crypto Ag: 1280 6/1  CSF: RBC 2900, WBC 163, Lymphs 92 6/1 CSF cx: rare cryptococcus neoformans 6/7 CSF - Yeast  Thank you for allowing pharmacy to be a part of this patient's care.  Salome Arnt, PharmD, BCPS 01/06/2017 8:09 AM

## 2017-01-06 NOTE — Progress Notes (Signed)
  Speech Language Pathology Treatment: Dysphagia;Cognitive-Linquistic  Patient Details Name: William Pena MRN: [id number] DOB: January 05, 1986 Today's Date: 01/06/2017 Time: 4709-6283 SLP Time Calculation (min) (ACUTE ONLY): 32 min  Assessment / Plan / Recommendation Clinical Impression  Pt is alert and remains highly distractible. He will focus his attention to name calling most of the time, but not consistently yet. He needs Max cues for initiation and sustained attention. He takes in small amounts of pureed textures and swallows them relatively swiftly, although each bolus elicits multiple swallows and immediate, prolonged coughing. [name] showing increased signs of dysphagia today with concern for aspiration, which may be indicative of improving sensation post-extubation. Recommend that he remain NPO with consideration of temporary, alternative means of nutrition - also considering that his cognitive status limits his oral intake as well. Discussed with Dr. Shon Hale from neurology. Recommend to f/u on next date to assess mentation and s/s of aspiration for potential readiness to participate in instrumental testing.   HPI HPI: Patient is a 31 y/o male presents with sepsis secondary to perirectal abscess s/p I&D 5/23 and disseminated cryptococcal infection including meningitis, bacteremia, and likely pneumonia. Intubated 5/28-6/5. 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 Second MRI- extension of the diffusion restriction in the left basal ganglia and right basal ganglia. s/p Lumbar drain 5/30. PMH includes advanced hiv disease      SLP Plan  Other (Comment) (instrumental testing)       Recommendations  Diet recommendations: NPO Medication Administration: Via alternative means                Oral Care Recommendations: Oral care QID Follow up Recommendations: Inpatient Rehab SLP Visit Diagnosis: Cognitive communication deficit  (R41.841);Dysphagia, unspecified (R13.10) Plan: Other (Comment) (instrumental testing)       GO                Germain Osgood 01/06/2017, 4:35 PM  Germain Osgood, M.A. CCC-SLP [phone]

## 2017-01-06 NOTE — Progress Notes (Signed)
[facility] Attending Note  ADMISSION DATE:  12/20/2016  CONSULTATION DATE:  12/22/2016  REFERRING MD :  Carlyle Basques, M.D.  CHIEF COMPLAINT:  Disseminated cryptococcus  Presenting HPI:  31 y.o. male with HIV/AIDS (last CD4 1 w/ VL 81K). Just started back on ART 2 months ago however he was incarcerated and reported he was not given his medications. He was admitted on 5/22 for perirectal abscess. During admission he was also noted to have HA, fever, chills, night sweats neck pain and LAN. ID was consulted and recommended imaging of chest, LP, and cultures be sent from perirectal I&D. Since that time CT chest results: "Bilateral airspace disease with a small cavitary lesion in the right upper lobe. Findings compatible with pneumonia.Trace right pleural fluid versus right pleural thickening". Also his serum cryptococcal antigenemia titer was elevated raising the possibility of disseminated cryptococcal infection. Including lung involvement. We have been asked to see by infectious disease for bronchoscopy and washings in effort to help determine if CT changes reflect mTB OR possibly pulmonary cryptococcal disease. Initially unable to perform initial bronchoscopy due to hypoxia. 5/28 called by primary for altered LOC. He became very lethargic/obtunded requiring CT and LP, but airway was an issue so PCCM asked to see for ICU transfer and intubation.  Subjective:   Lethargic male supine in bed, selectively following commands. [name] not answering any questions    Temp:  [97.8 F (36.6 C)-99.1 F (37.3 C)] 99 F (37.2 C) (06/08 0800) Pulse Rate:  [48-93] 75 (06/08 1000) Resp:  [8-19] 11 (06/08 1000) BP: (103-152)/(62-114) 121/70 (06/08 1000) SpO2:  [99 %-100 %] 100 % (06/08 1000) Weight:  [131 lb 13.4 oz (59.8 kg)] 131 lb 13.4 oz (59.8 kg) (06/08 0430)   Intake/Output Summary (Last 24 hours) at 01/06/17 1140 Last data filed at 01/06/17 0900  Gross per 24 hour  Intake           2004.17 ml  Output             3642 ml  Net         -1637.83 ml    General appearance: 31 Year old  Male  NAD, confused at times, selectively following commands   Eyes: anicteric sclerae, moist conjunctivae; PERRL, EOMI bilaterally. Mouth: MM Pink and moist, no ulcerations noted Neck: Trachea midline; neck supple, no JVD Lungs/chest: CTA, with normal respiratory effort and no intercostal retractions CV: S1,S2, RRR, No RMG, bradycardia Abdomen: Soft, non-tender; non-distended, no masses or HSM Extremities: Trace  peripheral edema no  extremity lymphadenopathy Skin: clammy and diaphoretic,normal turgor and texture; no rash, ulcers or subcutaneous nodules noted Neuro/Psych: Neuro exam waxing and waning today. +generalized weakness. LP drain w/ clear CSF, very flat affect  CBC Recent Labs     01/04/17  0336  01/05/17  0233  01/06/17  0332  WBC  4.3  2.8*  4.7  HGB  9.0*  8.6*  9.4*  HCT  28.1*  27.1*  29.9*  PLT  275  255  285    Coag's No results for input(s): APTT, INR in the last 72 hours.  BMET Recent Labs     01/04/17  0336  01/05/17  0233  01/06/17  0332  NA  134*  130*  129*  K  4.5  4.2  4.3  CL  103  101  98*  CO2  _0 BUN  _1 CREATININE  1.02  0.94  1.07  GLUCOSE  85  89  98    Electrolytes Recent Labs     01/04/17  0336  01/05/17  0233  01/06/17  0332  CALCIUM  9.3  9.3  9.6  MG  1.6*  1.9  1.7  PHOS  5.7*  5.6*  5.6*    Sepsis Markers No results for input(s): PROCALCITON, O2SATVEN in the last 72 hours.  Invalid input(s): LACTICACIDVEN  ABG No results for input(s): PHART, PCO2ART, PO2ART in the last 72 hours.  Liver Enzymes Recent Labs     01/04/17  0336  01/05/17  0233  01/06/17  0332  ALBUMIN  2.8*  2.6*  3.0*    Cardiac Enzymes No results for input(s): TROPONINI, PROBNP in the last 72 hours.  Glucose Recent Labs     01/03/17  2344  01/04/17  0350  01/04/17  0926  01/04/17  1147  01/04/17  1556  01/04/17   2026  GLUCAP  89  84  87  92  98  81    Imaging No results found.   LINES/TUBES: OETT 8.0 5/28 - 6/5 OGT 5/28 - 6/5 Lumbar Drain 5/28 >>> Foley 5/28 >>> PIV   IMAGING/STUDIES: CT ABD/PELVIS W/ CONTRAST 5/23: 1. Edema tracking along the right side of the gluteal cleft is similar in appearance to the prior study. This reflected a subcutaneous abscess on the prior study. This most likely reflects a persistent small subcutaneous abscess. 2. Diffuse soft tissue edema tracking about the anorectal canal, without additional abscess. Diffuse presacral soft tissue inflammation noted. 3. Scrotal wall edema, worse on the right, with small bilateral hydroceles. 4. Mild right basilar airspace opacity raises concern for pneumonia. 5. Mild wall thickening along the distal sigmoid colon and rectum, raising concern for proctitis. 6. Enlarged azygoesophageal recess, retroperitoneal, pelvic sidewall and bilateral inguinal nodes seen, measuring up to 1.7 cm in short axis. This likely reflects the underlying infection. 7. Cholelithiasis.  Gallbladder otherwise unremarkable. CT HEAD W/O 5/29:  Abnormal low-density in the left basal ganglia which may reflect worsening infection or acute infarction. MRI BRAIN W/O 5/29:  Worsening/progression of small vessel occlusive infarctions along the base the brain bilaterally with new involvement affecting a large portion of the corpus striatum on the left. Worsening of small vessel occlusive infarctions along the superior surface of the brain an underlying white matter as well. Findings are consistent with progressive complication of meningitis.   MICROBIOLOGY: MRSA PCR 5/23:  Negative  Blood Cultures x2 5/22:  2/2 Bottles Positive Cryptococcus neoformans  Blood Cultures x2 5/23:  2/2 Bottles Positive Cryptococcus neoformans  Wound Culture 5/23:  Multiple organisms present CSF Culture 5/24:  Abundant Cryptococcus neoformans  Quantiferon TB 5/26:  Negative  CSF  Culture 5/27:  Cryptococcus neoformans  BAL RML 5/28:  Cryptococcus neoformans / AFB smear negative & culture pending Tracheal Aspirate AFB 5/30 >>> Smear negative / Ctx pending Blood Cultures x2 5/30:  Negative  Stool Culture 5/31:  Negative  Stool Gastrointestinal Panel 5/31:  Negative Stool C diff 5/31:  Negative  CSF Culture 6/1 >>> Encapsulated yeast seen  ANTIBIOTICS: Triumeq 5/23 - 5/24 Flagyl 5/23 - 5/24 Vancomycin 5/23 - 5/24 Doxycycline 5/24 - 5/25 Rocephin 5/23 - 5/30 Augmentin 5/24 - 5/25 Bactrim 5/23 >>> Flucytosine 5/24 >>> Valtrex 5/24 >>> Azithromycin qweek 5/24 >>> Ambisome 5/24 >>>  SIGNIFICANT EVENTS: 05/22 - Admit with perirectal abscess  ASSESSMENT/PLAN:   31 y.o. male with sepsis secondary to perirectal abscess and disseminated  cryptococcal infection. Appreciate assistance from neurology, neurosurgery, and infectious diseases. Encephalopathy has some waxing and waning.  Disseminated cryptococcal infection: Plan Cont rx as guided by infectious disease service (L-ampho + Flucytosine) today is day 16 No change until CSF cleared  Cryptococcal meningoencephalitis Plan LP drain per neuro-surg ( Disconnected over night with drainage, reconnected 6/8 am) Antimicrobials as above   Acute hypoxic respiratory failure Secondary to cryptococcal pneumonia.  -extubated 6/5 Plan Cont IS Flutter valve as able Mobilize as able  Acute encephalopathy: Likely multifactorial from delirium as well as meningoencephalitis and Basal ganglia infarct Has resultant deconditioning  -neurology following. Infarct likely a complication of the meningoencephalitis  Plan Cont supportive care Could transfer  CIR when LP drain is out AND L-ampho and flucytosine transitioned  Perirectal abscess: Status post drainage. Plan BID dressing change Surgical services have d/w WON  Fluid and electrolyte imbalance  - resolved AKI - hypomagnesemia  - Hyponatremia Plan Recheck and  replace lytes as indicated  HIV/AIDS; Genital herpes:  Plan Completed acyclovir 6/6  Cont prophylaxis w/ bactrim and azith Further rx per ID; holding ARVs given CM infection   Diarrhea: Stool culture negative. Infectious workup negative. Plan Strict I&O  Monitor stool output  Anemia: Secondary to anemia of chronic disease.  Plan Trend CBC Transfuse as indicated LP drain disconnected last pm. Reconnected by neuro and will leave in overnight. Waxing and waning neuro status today, very flat affect Prophylaxis:  SCDs, Heparin  q8hr, & Pepcid via tube qhs. Diet: Dysphagia 2 diet per speech, with close supervision Code Status:  Full Code per previous physician discussions.   Disposition:  Patient to remain in the ICU while lumbar drain in place. PT, OT, and speech therapy consulted. Family Update: Mother updated at bedside   Magdalen Spatz, [place] Pager #  # [phone]  01/06/2017 11:40 AM   STAFF NOTE: Linwood Dibbles, MD FACP have personally reviewed patient's available data, including medical history, events of note, physical examination and test results as part of my evaluation. I have discussed with resident/NP and other care providers such as pharmacist, RN and RRT. In addition, I personally evaluated patient and elicited key findings of: more lethargic, perr, moves upper ext, not fc well, events of last night and this am noted, drain remains and assessed by NS, consider STAT head CT for events of csf drainage, r/o sdh or other changes related to icp changes, he is currently protecting airway and in no distress, Na remains at 129, may need lasix, will assess serum osm and urine na, osm then decide on treatment as he was neg 1.4 liters on own, would prefer to normalize NA with neurostatus, nPO, consider zofran, no changes to antifungals as of yet, keep in icu, I updated mom  Lavon Paganini. Titus Mould, MD, [place] Pgr: [facility] 01/06/2017 12:10 PM

## 2017-01-06 NOTE — Progress Notes (Signed)
Lumbar drain noted to be disconnected w/ CSF leaking on to pt gown. Dr. Kathyrn Sheriff at bedside, reconnected drain. Drain output to be 10cc q2hrs.

## 2017-01-06 NOTE — NC FL2 (Signed)
[place] LEVEL OF CARE SCREENING TOOL     IDENTIFICATION  Patient Name: William Pena Birthdate: 14-Jul-1986 Sex: male Admission Date (Current Location): 12/20/2016  [facility] and [place] Number:  Herbalist and Address:  [facility] [place] [address] [place], [place] [address]      Provider Number: [id number]  Attending Physician Name and Address:  Collene Gobble, MD  Relative Name and Phone Number:       Current Level of Care: Hospital Recommended Level of Care: [place] Prior Approval Number:    Date Approved/Denied:   PASRR Number: [id number]  Discharge Plan: SNF    Current Diagnoses: Patient Active Problem List   Diagnosis Date Noted  . HIV (human immunodeficiency virus infection) ([place])   . Meningitis   . 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 Acute blood loss anemia   . Elevated intracranial pressure   . HIV disease ([place])   . Lymphadenopathy of head and neck   . Rectal abscess   . 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 Cavitary pneumonia   . Diffuse lymphadenopathy   . Drug rash   . Hypomagnesemia 12/23/2016  . Encephalopathy acute 12/23/2016  . SIRS (systemic inflammatory response syndrome) ([place]) 12/23/2016  . Acute respiratory failure ([place]) 12/23/2016  . Pulmonary infiltrates   . Perirectal abscess 12/21/2016  . Hyponatremia 12/21/2016  . Normocytic anemia 12/21/2016  . Elevated LFTs 12/21/2016  . AIDS (acquired immune deficiency syndrome) ([place]) 07/07/2016  . Chronic pain 06/20/2011    Orientation RESPIRATION BLADDER Height & Weight     Self  Normal Continent Weight: 131 lb 13.4 oz (59.8 kg) Height:  5\' 6"  (167.6 cm)  BEHAVIORAL SYMPTOMS/MOOD NEUROLOGICAL BOWEL NUTRITION STATUS      Incontinent  (Please see d/c summary)  AMBULATORY STATUS COMMUNICATION OF NEEDS Skin   Extensive Assist Verbally Surgical wounds (Closed incision, Perineum, moist to dry guaze.,  change dressing twice a week. Closed incision, back, guaze dressing.)                       Personal Care Assistance Level of Assistance  Bathing, Feeding, Dressing Bathing Assistance: Maximum assistance Feeding assistance: Limited assistance Dressing Assistance: Maximum assistance     Functional Limitations Info  Sight, Hearing, Speech Sight Info: Adequate Hearing Info: Adequate Speech Info: Adequate    SPECIAL CARE FACTORS FREQUENCY  PT (By licensed PT), OT (By licensed OT)     PT Frequency: 3x week OT Frequency: 3x week            Contractures Contractures Info: Not present    Additional Factors Info  Code Status, Allergies Code Status Info: Full Code Allergies Info: Augmentin Amoxicillin-pot Clavulanate           Current Medications (01/06/2017):  This is the current hospital active medication list Current Facility-Administered Medications  Medication Dose Route Frequency Provider Last Rate Last Dose  . 0.9 %  sodium chloride infusion   Intravenous Continuous Raylene Miyamoto, MD 50 mL/hr at 01/06/17 0913    . acetaminophen (TYLENOL) suppository 650 mg  650 mg Rectal Q6H PRN Anders Simmonds, MD   650 mg at 01/03/17 2200  . amphotericin B liposome (AMBISOME) 240 mg in dextrose 5 % 500 mL IVPB  4 mg/kg Intravenous Q24H Rumbarger, Rachel L, RPH      . azithromycin (ZITHROMAX) tablet 1,200 mg  1,200 mg Oral Weekly Baltazar Apo  S, MD   1,200 mg at 01/06/17 0943  . diphenhydrAMINE (BENADRYL) injection 25 mg  25 mg Intravenous Q6H PRN Rayburn, Kelly A, PA-C   25 mg at 12/22/16 2229  . docusate sodium (COLACE) capsule 100 mg  100 mg Oral BID PRN Collene Gobble, MD      . famotidine (PEPCID) tablet 20 mg  20 mg Oral QHS Collene Gobble, MD   20 mg at 01/05/17 2236  . feeding supplement (ENSURE ENLIVE) (ENSURE ENLIVE) liquid 237 mL  237 mL Oral BID BM Raylene Miyamoto, MD   237 mL at 01/06/17 [phone]  . flucytosine (ANCOBON) capsule 1,500 mg  1,500 mg Per Tube Q6H  Chesley Mires, MD   1,500 mg at 01/06/17 0027  . heparin injection 5,000 Units  5,000 Units Subcutaneous Q8H Javier Glazier, MD   5,000 Units at 01/06/17 0555  . lidocaine (PF) (XYLOCAINE) 1 % injection 5 mL  5 mL Other Once [place] Callas, NP      . MEDLINE mouth rinse  15 mL Mouth Rinse BID Collene Gobble, MD   15 mL at 01/06/17 0950  . ondansetron (ZOFRAN) injection 4 mg  4 mg Intravenous Q6H PRN Rayburn, Kelly A, PA-C   4 mg at 12/22/16 1939  . polyethylene glycol (MIRALAX / GLYCOLAX) packet 17 g  17 g Oral Daily PRN Rayburn, Kelly A, PA-C      . sodium chloride 0.9 % bolus 500 mL  500 mL Intravenous Q24H Javier Glazier, MD 500 mL/hr at 01/06/17 0931 500 mL at 01/06/17 0931  . sodium chloride 0.9 % bolus 500 mL  500 mL Intravenous Q24H Javier Glazier, MD   Stopped at 01/05/17 1600  . sulfamethoxazole-trimethoprim (BACTRIM,SEPTRA) 200-40 MG/5ML suspension 10 mL  10 mL Per Tube Daily  Callas, NP   10 mL at 01/06/17 0938  . traMADol (ULTRAM) tablet 50 mg  50 mg Oral Q6H PRN Javier Glazier, MD   50 mg at 01/05/17 2030     Discharge Medications: Please see discharge summary for a list of discharge medications.  Relevant Imaging Results:  Relevant Lab Results:   Additional Information SSN: [id number]  Eileen Stanford, LCSW

## 2017-01-06 NOTE — Progress Notes (Signed)
[facility] for Infectious Disease   Reason for visit: Follow up on   Interval History: no acute events; no other history from the patient; Day 17 ampho/flucytosine  Physical Exam: Constitutional:  Vitals:   01/06/17 1200 01/06/17 1300  BP: 118/79 (!) 148/90  Pulse: (!) 57 (!) 56  Resp: 11 12  Temp:     patient appears in NAD Respiratory: Normal respiratory effort; CTA B Cardiovascular: RRR GI: soft, nt, nd  Review of Systems: Unable to be assessed due to mental status  Lab Results  Component Value Date   WBC 4.7 01/06/2017   HGB 9.4 (L) 01/06/2017   HCT 29.9 (L) 01/06/2017   MCV 86.2 01/06/2017   PLT 285 01/06/2017    Lab Results  Component Value Date   CREATININE 1.07 01/06/2017   BUN 19 01/06/2017   NA 129 (L) 01/06/2017   K 4.3 01/06/2017   CL 98 (L) 01/06/2017   CO2 23 01/06/2017    Lab Results  Component Value Date   ALT 42 12/28/2016   AST 30 12/28/2016   ALKPHOS 135 (H) 12/28/2016     Microbiology: Recent Results (from the past 240 hour(s))  Culture, blood (routine x 2)     Status: None   Collection Time: 12/28/16  5:12 AM  Result Value Ref Range Status   Specimen Description BLOOD RIGHT HAND  Final   Special Requests   Final    BOTTLES DRAWN AEROBIC ONLY Blood Culture adequate volume   Culture NO GROWTH 5 DAYS  Final   Report Status 01/02/2017 FINAL  Final  Culture, blood (routine x 2)     Status: None   Collection Time: 12/28/16  5:12 AM  Result Value Ref Range Status   Specimen Description BLOOD LEFT HAND  Final   Special Requests IN PEDIATRIC BOTTLE Blood Culture adequate volume  Final   Culture NO GROWTH 5 DAYS  Final   Report Status 01/02/2017 FINAL  Final  Acid Fast Smear (AFB)     Status: None   Collection Time: 12/28/16  3:16 PM  Result Value Ref Range Status   AFB Specimen Processing Concentration  Final   Acid Fast Smear Negative  Final    Comment: (NOTE) Performed At: [facility] [place],  [place] [id number] Lindon Romp MD [id number]    Source (AFB) TRACHEAL ASPIRATE  Final  Stool culture (children & immunocomp patients)     Status: None   Collection Time: 12/29/16 12:06 PM  Result Value Ref Range Status   Salmonella/Shigella Screen Final report  Final   Campylobacter Culture Final report  Final   E coli, Shiga toxin Assay Negative Negative Final    Comment: (NOTE) Performed At: [facility] [place], [place] [id number] Lindon Romp MD [id number]   Gastrointestinal Panel by PCR , Stool     Status: None   Collection Time: 12/29/16 12:06 PM  Result Value Ref Range Status   Campylobacter species NOT DETECTED NOT DETECTED Final   Plesimonas shigelloides NOT DETECTED NOT DETECTED Final   Salmonella species NOT DETECTED NOT DETECTED Final   Yersinia enterocolitica NOT DETECTED NOT DETECTED Final   Vibrio species NOT DETECTED NOT DETECTED Final   Vibrio cholerae NOT DETECTED NOT DETECTED Final   Enteroaggregative E coli (EAEC) NOT DETECTED NOT DETECTED Final   Enteropathogenic E coli (EPEC) NOT DETECTED NOT DETECTED Final   Enterotoxigenic E coli (ETEC) NOT DETECTED NOT DETECTED Final  Shiga like toxin producing E coli (STEC) NOT DETECTED NOT DETECTED Final   Shigella/Enteroinvasive E coli (EIEC) NOT DETECTED NOT DETECTED Final   Cryptosporidium NOT DETECTED NOT DETECTED Final   Cyclospora cayetanensis NOT DETECTED NOT DETECTED Final   Entamoeba histolytica NOT DETECTED NOT DETECTED Final   Giardia lamblia NOT DETECTED NOT DETECTED Final   Adenovirus F40/41 NOT DETECTED NOT DETECTED Final   Astrovirus NOT DETECTED NOT DETECTED Final   Norovirus GI/GII NOT DETECTED NOT DETECTED Final   Rotavirus A NOT DETECTED NOT DETECTED Final   Sapovirus (I, II, IV, and V) NOT DETECTED NOT DETECTED Final  C difficile quick scan w PCR reflex     Status: None   Collection Time: 12/29/16 12:06 PM  Result Value Ref Range Status   C Diff antigen NEGATIVE  NEGATIVE Final   C Diff toxin NEGATIVE NEGATIVE Final   C Diff interpretation No C. difficile detected.  Final  STOOL CULTURE REFLEX - RSASHR     Status: None   Collection Time: 12/29/16 12:06 PM  Result Value Ref Range Status   Stool Culture result 1 (RSASHR) Comment  Final    Comment: (NOTE) No Salmonella or Shigella recovered. Performed At: [facility] [place], [place] [id number] Lindon Romp MD [id number]   STOOL CULTURE Reflex - CMPCXR     Status: None   Collection Time: 12/29/16 12:06 PM  Result Value Ref Range Status   Stool Culture result 1 (CMPCXR) Comment  Final    Comment: (NOTE) No Campylobacter species isolated. Performed At: [facility] [address] [place], [place] [id number] Lindon Romp MD [id number]   CSF culture with Stat gram stain     Status: None   Collection Time: 12/30/16  2:14 PM  Result Value Ref Range Status   Specimen Description CSF  Final   Special Requests Immunocompromised  Final   Gram Stain   Final    WBC PRESENT, PREDOMINANTLY MONONUCLEAR ENCAPSULATED YEAST SEEN CYTOSPIN SMEAR    Culture RARE CRYPTOCOCCUS NEOFORMANS  Final   Report Status 01/05/2017 FINAL  Final  Fungus Culture With Stain     Status: None (Preliminary result)   Collection Time: 12/30/16  2:14 PM  Result Value Ref Range Status   Fungus Stain Final report  Final    Comment: (NOTE) Performed At: [facility] [place], [place] [id number] Lindon Romp MD [id number]    Fungus (Mycology) Culture PENDING  Incomplete   Fungal Source PENDING  Incomplete  Fungus Culture Result     Status: None   Collection Time: 12/30/16  2:14 PM  Result Value Ref Range Status   Result 1 Comment  Final    Comment: (NOTE) Fungal elements, such as arthroconidia, hyphal fragments, chlamydoconidia, observed. Performed At: [facility] [address] [place], [place] [id number] Lindon Romp MD  [id number]   CSF culture with Stat gram stain     Status: None (Preliminary result)   Collection Time: 01/05/17  9:00 AM  Result Value Ref Range Status   Specimen Description CSF  Final   Special Requests Immunocompromised  Final   Gram Stain   Final    WBC PRESENT, PREDOMINANTLY MONONUCLEAR YEAST CRITICAL RESULT CALLED TO, READ BACK BY AND VERIFIED WITH: Barnett Abu, RN AT 1112 ON 01/05/17 BY C. JESSUP, MLT. 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  Continuing with induction therapy with recent positive culture from 6/1 sample   2. HIV - deffering ARVs due to above  Dr. Johnnye Sima is available over the weekend if needed

## 2017-01-07 ENCOUNTER — Inpatient Hospital Stay (HOSPITAL_COMMUNITY): Payer: Medicaid Other

## 2017-01-07 LAB — CBC WITH DIFFERENTIAL/PLATELET
BASOS ABS: 0 10*3/uL (ref 0.0–0.1)
Basophils Relative: 0 %
EOS PCT: 0 %
Eosinophils Absolute: 0 10*3/uL (ref 0.0–0.7)
HEMATOCRIT: 27.9 % — AB (ref 39.0–52.0)
HEMOGLOBIN: 9.1 g/dL — AB (ref 13.0–17.0)
LYMPHS ABS: 0.8 10*3/uL (ref 0.7–4.0)
LYMPHS PCT: 14 %
MCH: 27.4 pg (ref 26.0–34.0)
MCHC: 32.6 g/dL (ref 30.0–36.0)
MCV: 84 fL (ref 78.0–100.0)
Monocytes Absolute: 0.6 10*3/uL (ref 0.1–1.0)
Monocytes Relative: 12 %
NEUTROS ABS: 4.1 10*3/uL (ref 1.7–7.7)
Neutrophils Relative %: 74 %
Platelets: 244 10*3/uL (ref 150–400)
RBC: 3.32 MIL/uL — AB (ref 4.22–5.81)
RDW: 16.1 % — ABNORMAL HIGH (ref 11.5–15.5)
WBC: 5.5 10*3/uL (ref 4.0–10.5)

## 2017-01-07 LAB — BASIC METABOLIC PANEL
ANION GAP: 8 (ref 5–15)
BUN: 22 mg/dL — ABNORMAL HIGH (ref 6–20)
CHLORIDE: 100 mmol/L — AB (ref 101–111)
CO2: 21 mmol/L — AB (ref 22–32)
CREATININE: 1.12 mg/dL (ref 0.61–1.24)
Calcium: 9.3 mg/dL (ref 8.9–10.3)
GFR calc non Af Amer: >60 mL/min (ref >60)
Glucose, Bld: 90 mg/dL (ref 65–99)
POTASSIUM: 4 mmol/L (ref 3.5–5.1)
SODIUM: 129 mmol/L — AB (ref 135–145)

## 2017-01-07 LAB — RENAL FUNCTION PANEL
ANION GAP: 10 (ref 5–15)
Albumin: 3 g/dL — ABNORMAL LOW (ref 3.5–5.0)
BUN: 20 mg/dL (ref 6–20)
CHLORIDE: 100 mmol/L — AB (ref 101–111)
CO2: 21 mmol/L — AB (ref 22–32)
CREATININE: 1.11 mg/dL (ref 0.61–1.24)
Calcium: 9.3 mg/dL (ref 8.9–10.3)
Glucose, Bld: 92 mg/dL (ref 65–99)
POTASSIUM: 4 mmol/L (ref 3.5–5.1)
Phosphorus: 5.4 mg/dL — ABNORMAL HIGH (ref 2.5–4.6)
Sodium: 131 mmol/L — ABNORMAL LOW (ref 135–145)

## 2017-01-07 LAB — GLUCOSE, CAPILLARY
Glucose-Capillary: 142 mg/dL — ABNORMAL HIGH (ref 65–99)
Glucose-Capillary: 117 mg/dL — ABNORMAL HIGH (ref 65–99)
Glucose-Capillary: 115 mg/dL — ABNORMAL HIGH (ref 65–99)

## 2017-01-07 LAB — MAGNESIUM: MAGNESIUM: 1.7 mg/dL (ref 1.7–2.4)

## 2017-01-07 MED ORDER — AMANTADINE HCL 50 MG/5ML PO SYRP
100.0000 mg | ORAL_SOLUTION | Freq: Two times a day (BID) | ORAL | Status: DC
  Administered 2017-01-10 – 2017-02-06 (×54): 100 mg via ORAL
  Filled 2017-01-07 (×62): qty 10

## 2017-01-07 MED ORDER — FENTANYL CITRATE (PF) 100 MCG/2ML IJ SOLN
INTRAMUSCULAR | Status: AC
  Filled 2017-01-07: qty 2

## 2017-01-07 MED ORDER — AMANTADINE HCL 50 MG/5ML PO SYRP
50.0000 mg | ORAL_SOLUTION | Freq: Two times a day (BID) | ORAL | Status: AC
  Administered 2017-01-08 – 2017-01-09 (×3): 50 mg via ORAL
  Filled 2017-01-07 (×5): qty 5

## 2017-01-07 MED ORDER — PRO-STAT SUGAR FREE PO LIQD
30.0000 mL | Freq: Two times a day (BID) | ORAL | Status: DC
  Administered 2017-01-07 – 2017-01-09 (×5): 30 mL via ORAL
  Filled 2017-01-07 (×5): qty 30

## 2017-01-07 MED ORDER — FENTANYL CITRATE (PF) 100 MCG/2ML IJ SOLN
100.0000 ug | Freq: Once | INTRAMUSCULAR | Status: AC
  Administered 2017-01-07: 100 ug via INTRAVENOUS

## 2017-01-07 MED ORDER — FENTANYL CITRATE (PF) 100 MCG/2ML IJ SOLN
50.0000 ug | INTRAMUSCULAR | Status: DC | PRN
  Administered 2017-01-07: 50 ug via INTRAVENOUS
  Filled 2017-01-07: qty 2

## 2017-01-07 MED ORDER — JEVITY 1.2 CAL PO LIQD
1000.0000 mL | ORAL | Status: DC
  Administered 2017-01-07: 50 mL/h
  Administered 2017-01-08 – 2017-01-09 (×2): 1000 mL
  Filled 2017-01-07 (×5): qty 1000

## 2017-01-07 NOTE — Progress Notes (Signed)
  Speech Language Pathology Treatment: Dysphagia  Patient Details Name: William Pena MRN: [id number] DOB: 1986/01/18 Today's Date: 01/07/2017 Time: 5916-3846 SLP Time Calculation (min) (ACUTE ONLY): 15 min  Assessment / Plan / Recommendation Clinical Impression  Patient alert for f/u dysphagia treatment. Unable to follow directions however able to sustain attention to po trials, with max assist from SLP for feeding, for diagnostic treatment focused on readiness for a po diet. Patient presents with swift oral transit of bolus and seemingly timely swallow initiation with only minimal s/s of aspiration characterized by delayed cough. Given fluctuating swallowing abilities however, recommend instrumental testing to determine least restrictive diet. Will plan for MBS today.    HPI HPI: Patient is a 31 y/o male presents with sepsis secondary to perirectal abscess s/p I&D 5/23 and disseminated cryptococcal infection including meningitis, bacteremia, and likely pneumonia. Intubated 5/28-6/5. 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 Second MRI- extension of the diffusion restriction in the left basal ganglia and right basal ganglia. s/p Lumbar drain 5/30. PMH includes advanced hiv disease      SLP Plan  MBS       Recommendations  Diet recommendations: NPO Medication Administration: Via alternative means                Oral Care Recommendations: Oral care QID Follow up Recommendations: Inpatient Rehab SLP Visit Diagnosis: Dysphagia, unspecified (R13.10) Plan: MBS       GO             [name], [place] [phone]    [name] 01/07/2017, 10:11 AM

## 2017-01-07 NOTE — Progress Notes (Signed)
Modified Barium Swallow Progress Note  Patient Details  Name: William Pena MRN: [id number] Date of Birth: [birth year]  Today's Date: 01/07/2017  Modified Barium Swallow completed.  Full report located under Chart Review in the Imaging Section.  Brief recommendations include the following:  Clinical Impression  Patient presents with a moderate oropharyngeal dysphagia characterized by delayed but functional oral transit of bolus and relatively swift swallow initiation. Pharyngeally however, patient with decreased hyo-laryngeal movement, decreased epiglottic deflection, and decreased laryngeal closure s/p prolonged intubation and deconditioning, resulting in immediate penetration of liquids thinner than honey thick consistency with eventual silent aspiration. Unfortunately, patient unable to consistently utilize compensatory strategies (throat clear/hard cough and dry swallow) to consistently clear. Although residuals minimal post swallow (largely base of tongue and posterior pharyngeal wall), patient with poor awareness resulting in high risk of aspiration as residuals mix with saliva. Recommend continued NPO except ice chips after oral care PRN. Will continue to f/u closely.    Swallow Evaluation Recommendations       SLP Diet Recommendations: NPO;Ice chips PRN after oral care       Medication Administration: Via alternative means               Oral Care Recommendations: Oral care QID (prior to ice chips)      Gabriel Rainwater MA, CCC-SLP [phone]   Sheila Ocasio Meryl 01/07/2017,11:49 AM

## 2017-01-07 NOTE — Progress Notes (Addendum)
Neurology Progress Note  Subjective: His lumbar drain was removed earlier today. He had a modified barium swallow and remains at high risk of aspiration; SLP has recommended that he remain NPO. Corpak placement in planned for today. His neurologic status remains largely unchanged; at times he will simply stare at people when they speak to him while at other times he will correctly answer some questions with one-word replies. He still intermittently follows commands. His blood pressure was elevated after his procedure this morning and he was given fentanyl as it was felt his BP was likely elevated because of discomfort.   Medications reviewed and reconciled.   Pertinent meds: Amphotericin B 240 mg daily Azithromycin 1200 mg weekly Flucytosine 1500 mg every 6 hours Bactrim 10 mL daily    Current Meds:   Current Facility-Administered Medications:  .  fentaNYL (SUBLIMAZE) 100 MCG/2ML injection, , , ,  .  0.9 %  sodium chloride infusion, , Intravenous, Continuous, Raylene Miyamoto, MD, Last Rate: 50 mL/hr at 01/07/17 0500 .  acetaminophen (TYLENOL) suppository 650 mg, 650 mg, Rectal, Q6H PRN, Anders Simmonds, MD, 650 mg at 01/03/17 2200 .  amphotericin B liposome (AMBISOME) 240 mg in dextrose 5 % 500 mL IVPB, 4 mg/kg, Intravenous, Q24H, Rumbarger, Valeda Malm, RPH, Last Rate: 250 mL/hr at 01/07/17 1043, 240 mg at 01/07/17 1043 .  azithromycin (ZITHROMAX) tablet 1,200 mg, 1,200 mg, Oral, Weekly, Collene Gobble, MD, 1,200 mg at 01/06/17 0943 .  docusate sodium (COLACE) capsule 100 mg, 100 mg, Oral, BID PRN, Collene Gobble, MD .  famotidine (PEPCID) tablet 20 mg, 20 mg, Oral, QHS, Byrum, Rose Fillers, MD, 20 mg at 01/05/17 2236 .  feeding supplement (ENSURE ENLIVE) (ENSURE ENLIVE) liquid 237 mL, 237 mL, Oral, BID BM, Raylene Miyamoto, MD, Stopped at 01/06/17 1400 .  flucytosine (ANCOBON) capsule 1,500 mg, 1,500 mg, Per Tube, Q6H, Chesley Mires, MD, Stopped at 01/06/17 1800 .  heparin injection  5,000 Units, 5,000 Units, Subcutaneous, Q8H, Javier Glazier, MD, 5,000 Units at 01/07/17 0547 .  lidocaine (PF) (XYLOCAINE) 1 % injection 5 mL, 5 mL, Other, Once, Dixon, Melton Krebs, NP .  MEDLINE mouth rinse, 15 mL, Mouth Rinse, BID, Collene Gobble, MD, 15 mL at 01/06/17 2207 .  [DISCONTINUED] ondansetron (ZOFRAN-ODT) disintegrating tablet 4 mg, 4 mg, Oral, Q6H PRN **OR** ondansetron (ZOFRAN) injection 4 mg, 4 mg, Intravenous, Q6H PRN, Rayburn, Kelly A, PA-C, 4 mg at 12/22/16 1939 .  polyethylene glycol (MIRALAX / GLYCOLAX) packet 17 g, 17 g, Oral, Daily PRN, Rayburn, Kelly A, PA-C .  sodium chloride 0.9 % bolus 500 mL, 500 mL, Intravenous, Q24H, Nestor, Sonia Baller, MD, Stopped at 01/07/17 1020 .  sodium chloride 0.9 % bolus 500 mL, 500 mL, Intravenous, Q24H, Nestor, Sonia Baller, MD, Stopped at 01/06/17 1400 .  sulfamethoxazole-trimethoprim (BACTRIM,SEPTRA) 200-40 MG/5ML suspension 10 mL, 10 mL, Per Tube, Daily, Shelby Callas, NP, 10 mL at 01/06/17 4098 .  traMADol (ULTRAM) tablet 50 mg, 50 mg, Oral, Q6H PRN, Javier Glazier, MD, 50 mg at 01/05/17 2030  Objective:  Temp:  [97.7 F (36.5 C)-99.2 F (37.3 C)] 97.7 F (36.5 C) (06/09 0812) Pulse Rate:  [54-92] 66 (06/09 1100) Resp:  [12-18] 18 (06/09 1100) BP: (111-193)/(63-117) 193/92 (06/09 1000) SpO2:  [97 %-100 %] 100 % (06/09 1100) Weight:  [60.1 kg (132 lb 7.9 oz)] 60.1 kg (132 lb 7.9 oz) (06/09 0500)  General: WDWN AA man lying in ICU bed. [name] presently  sleeping after getting fentanyl. He opened his eyes briefly to voice but did not participate with the exam.   HEENT: Neck supple. No LAD. Sclerae are anicteric. There is no conjunctival injection.  CV: Regular, no murmur. Carotid pulses are 2+ and symmetric with no bruits. Distal pulses 2+ and symmetric.  Lungs: CTAB on anterior exam.   Extremities: No C/C/E. Numerous tattoos noted over body and face.  Neuro: MS: As noted above.  CN: Pupils are equal and reactive from  3-->2 mm bilaterally. Eyes are conjugate. Corneals are intact and symmetric. His face appears grossly symmetric. The remainder of his cranial nerve exam is limited as he does note participate.  Motor: Normal bulk. Tone is increased in both arms which are flexed against his trunk. No tremor or other abnormal movements are observed.  Sensation: Withdrawal noted to nailbed pressure x4.  DTRs: 3+, symmetric. Toes are mute bilaterally.  Coordination/gait: These cannot be assessed as he does not participate with the exam.   Labs: Lab Results  Component Value Date   WBC 5.5 01/07/2017   HGB 9.1 (L) 01/07/2017   HCT 27.9 (L) 01/07/2017   PLT 244 01/07/2017   GLUCOSE 92 01/07/2017   GLUCOSE 90 01/07/2017   TRIG 144 12/26/2016   ALT 42 12/28/2016   AST 30 12/28/2016   NA 131 (L) 01/07/2017   NA 129 (L) 01/07/2017   K 4.0 01/07/2017   K 4.0 01/07/2017   CL 100 (L) 01/07/2017   CL 100 (L) 01/07/2017   CREATININE 1.11 01/07/2017   CREATININE 1.12 01/07/2017   BUN 20 01/07/2017   BUN 22 (H) 01/07/2017   CO2 21 (L) 01/07/2017   CO2 21 (L) 01/07/2017   INR 1.09 01/06/2017   CBC Latest Ref Rng & Units 01/07/2017 01/06/2017 01/05/2017  WBC 4.0 - 10.5 K/uL 5.5 4.7 2.8(L)  Hemoglobin 13.0 - 17.0 g/dL 9.1(L) 9.4(L) 8.6(L)  Hematocrit 39.0 - 52.0 % 27.9(L) 29.9(L) 27.1(L)  Platelets 150 - 400 K/uL 244 285 255    No results found for: HGBA1C Lab Results  Component Value Date   ALT 42 12/28/2016   AST 30 12/28/2016   ALKPHOS 135 (H) 12/28/2016   BILITOT 0.4 12/28/2016   Radiology:  There is no new neuroimaging for review.   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 Continue amphotericin B and flucytosine as per ID recs. Lumbar drain removed this morning, will continue to monitor. Appreciate neurosurgery and ID assistance.   2. Acute ischemic stroke: This is acute, due to small vessel involvement from his cryptococcal infection. Continue with  antifungal therapy as above. No role for antiplatelet agents or statins in this setting.  3. Acute encephalopathy: This is multifactorial in etiology with contributions from cryptococcal meningitis and acute strokes with additional contributions likely from metabolic derangements, medication effect, and respiratory failure. Overall he has improved but now has clear abulia and cognitive dysfunction (impairments in sustained attention, speed of processing, and executive function) that are consistent with his L basal ganglia infarct. Continue supportive care. Continue to optimize metabolic status as you are. Continue to treat underlying infections as you are. Minimize sedation as tolerated. Avoid anything with strong anticholinergic properties that this is likely to worsen his encephalopathy. I will initiate at trial of amantadine beginning tomorrow to see if this may help him be more interactive, particularly with rehab services.  Appreciate SLP assistance.   4. 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 CD4 count this admission  is 21 with high viral load greater than 1 million. Currently not receiving antiretroviral therapy in the setting of cryptococcal treatment. Appreciate ID assistance.   D/w patient's mother, father, and grandmother who were all present at the bedside. They are in agreement with the plan as noted. They were given the chance to ask any questions and these were addressed to their satisfaction.   Melba Coon, MD [facility]

## 2017-01-07 NOTE — Progress Notes (Signed)
Patient ID: William Pena, male   DOB: Dec 18, 1985, 31 y.o.   MRN: [id number] Vital signs stable Patient's level of alertness and cooperation remains variable Seems to maintain flexed posture in upper extremities There to follow some commands with distal lower extremities intermittently Catheter has been in place for nearly 10 days We'll remove at this time Pressure dressing applied the lumbar spine

## 2017-01-07 NOTE — Progress Notes (Signed)
[facility] Note  ADMISSION DATE:  12/20/2016  CONSULTATION DATE:  12/22/2016  REFERRING MD :  Carlyle Basques, M.D.  CHIEF COMPLAINT:  Disseminated cryptococcus  Presenting HPI:  31 y.o. male with HIV/AIDS (last CD4 1 w/ VL 81K). Just started back on ART 2 months ago however he was incarcerated and reported he was not given his medications. He was admitted on 5/22 for perirectal abscess. During admission he was also noted to have HA, fever, chills, night sweats neck pain and LAN. ID was consulted and recommended imaging of chest, LP, and cultures be sent from perirectal I&D. Since that time CT chest results: "Bilateral airspace disease with a small cavitary lesion in the right upper lobe. Findings compatible with pneumonia.Trace right pleural fluid versus right pleural thickening". Also his serum cryptococcal antigenemia titer was elevated raising the possibility of disseminated cryptococcal infection. Including lung involvement. We have been asked to see by infectious disease for bronchoscopy and washings in effort to help determine if CT changes reflect mTB OR possibly pulmonary cryptococcal disease. Initially unable to perform initial bronchoscopy due to hypoxia. 5/28 called by primary for altered LOC. He became very lethargic/obtunded requiring CT and LP, but airway was an issue so PCCM asked to see for ICU transfer and intubation.  Subjective:  RN reports pt will have intermittent periods of lucidity mixed with no following commands. No acute events overnight.       Temp:  [97.9 F (36.6 C)-99.2 F (37.3 C)] 97.9 F (36.6 C) (06/09 0400) Pulse Rate:  [56-92] 74 (06/09 0600) Resp:  [10-18] 15 (06/09 0600) BP: (103-173)/(62-105) 141/72 (06/09 0600) SpO2:  [97 %-100 %] 100 % (06/09 0600) Weight:  [132 lb 7.9 oz (60.1 kg)] 132 lb 7.9 oz (60.1 kg) (06/09 0500)   Intake/Output Summary (Last 24 hours) at 01/07/17 0714 Last data filed at 01/07/17 0600  Gross per 24 hour   Intake             2650 ml  Output             3162 ml  Net             -512 ml   General: young adult male in NAD   HEENT: MM pink/moist, no jvd, fair dentition Neuro: eyes open, tracks provider, follows commands, barely audible speech CV: s1s2 rrr, no m/r/g PULM: even/non-labored, lungs bilaterally clear  LO:VFIE, non-tender, bsx4 active  Extremities: warm/dry, no edema  Skin: no rashes or lesions, covered in tattoos   CBC Recent Labs     01/05/17  0233  01/06/17  0332  01/07/17  0257  WBC  2.8*  4.7  5.5  HGB  8.6*  9.4*  9.1*  HCT  27.1*  29.9*  27.9*  PLT  255  285  244    Coag's Recent Labs     01/06/17  1654  APTT  41*  INR  1.09    BMET Recent Labs     01/05/17  0233  01/06/17  0332  01/07/17  0257  NA  130*  129*  129*  131*  K  4.2  4.3  4.0  4.0  CL  101  98*  100*  100*  CO2  22  23  21*  21*  BUN  18  19  22*  20  CREATININE  0.94  1.07  1.12  1.11  GLUCOSE  89  98  90  92    Electrolytes  Recent Labs     01/05/17  0233  01/06/17  0332  01/07/17  0257  CALCIUM  9.3  9.6  9.3  9.3  MG  1.9  1.7  1.7  PHOS  5.6*  5.6*  5.4*    Sepsis Markers No results for input(s): PROCALCITON, O2SATVEN in the last 72 hours.  Invalid input(s): LACTICACIDVEN  ABG No results for input(s): PHART, PCO2ART, PO2ART in the last 72 hours.  Liver Enzymes Recent Labs     01/05/17  0233  01/06/17  0332  01/07/17  0257  ALBUMIN  2.6*  3.0*  3.0*    Cardiac Enzymes No results for input(s): TROPONINI, PROBNP in the last 72 hours.  Glucose Recent Labs     01/04/17  0926  01/04/17  1147  01/04/17  1556  01/04/17  2026  01/06/17  1340  GLUCAP  87  92  98  81  103*    Imaging Ct Head Wo Contrast  Result Date: 01/06/2017 CLINICAL DATA:  Worsening responsiveness.  Meningitis. EXAM: CT HEAD WITHOUT CONTRAST TECHNIQUE: Contiguous axial images were obtained from the base of the skull through the vertex without intravenous contrast. COMPARISON:   MRI 12/27/2016. CT 12/27/2016. Multiple previous MR and CT exams. FINDINGS: Brain: Since the previous exams, the patient has developed low level hyperdensity within the caudate and putamen on the left and at the left frontoparietal vertex. These findings probably represent petechial bleeding within areas previously shown to be affected by acute infarction. No evidence of macro hemorrhage/hematoma. Other areas of deep brain and cerebral hemispheric infarction are an apparent by CT. No sign of shift, hydrocephalus or extra-axial fluid collection. Vascular: Negative Skull: Negative Sinuses/Orbits: Clear/normal Other: None IMPRESSION: Development of low level hyperdensity in the left caudate, left putamen and at the left posterior frontal vertex, most consistent with petechial hemorrhage within areas of previously demonstrated infarction. No macro hemorrhage/hematoma. No new insult identified otherwise. No mass effect or hydrocephalus. Electronically Signed   By: Nelson Chimes M.D.   On: 01/06/2017 13:04   Dg Abd Portable 1v  Result Date: 01/06/2017 CLINICAL DATA:  Initial evaluation for bowel trouble, perirectal abscess, history of HIV EXAM: PORTABLE ABDOMEN - 1 VIEW COMPARISON:  Prior CT from 12/20/2016. FINDINGS: Bowel gas pattern within normal limits without obstruction or ileus. No abnormal bowel wall thickening. No appreciable free air on this limited supine view the abdomen. Catheter tubing overlies the lower mid abdomen. No soft tissue mass or abnormal calcification. Visualized lung bases are clear. Visualized osseous structures unremarkable. IMPRESSION: Nonobstructive bowel gas pattern with no radiographic evidence for acute intra-abdominal process. Electronically Signed   By: Jeannine Boga M.D.   On: 01/06/2017 22:22     LINES/TUBES: OETT 8.0 5/28 - 6/5 OGT 5/28 - 6/5 Lumbar Drain 5/28 >> Foley 5/28 >> PIV    IMAGING/STUDIES: CT ABD/PELVIS W/ CONTRAST 5/23: 1. Edema tracking along the  right side of the gluteal cleft is similar in appearance to the prior study. This reflected a subcutaneous abscess on the prior study. This most likely reflects a persistent small subcutaneous abscess. 2. Diffuse soft tissue edema tracking about the anorectal canal, without additional abscess. Diffuse presacral soft tissue inflammation noted. 3. Scrotal wall edema, worse on the right, with small bilateral hydroceles. 4. Mild right basilar airspace opacity raises concern for pneumonia. 5. Mild wall thickening along the distal sigmoid colon and rectum, raising concern for proctitis. 6. Enlarged azygoesophageal recess, retroperitoneal, pelvic sidewall and bilateral inguinal nodes seen,  measuring up to 1.7 cm in short axis. This likely reflects the underlying infection. 7. Cholelithiasis.  Gallbladder otherwise unremarkable. CT HEAD W/O 5/29:  Abnormal low-density in the left basal ganglia which may reflect worsening infection or acute infarction. MRI BRAIN W/O 5/29:  Worsening/progression of small vessel occlusive infarctions along the base the brain bilaterally with new involvement affecting a large portion of the corpus striatum on the left. Worsening of small vessel occlusive infarctions along the superior surface of the brain an underlying white matter as well. Findings are consistent with progressive complication of meningitis. CT HEAD 6/8: development of low level hyperdensity in the left caudate, left putamen and at the left posterior frontal vertex, most consistent with petechial hemorrhage within areas of previously demonstrated infarction.  No macro hemorrhage/hematoma, no new insult identified, no mass effect or hydrocephalus.    MICROBIOLOGY: MRSA PCR 5/23:  Negative  Blood Cultures x2 5/22:  2/2 Bottles Positive Cryptococcus neoformans  Blood Cultures x2 5/23:  2/2 Bottles Positive Cryptococcus neoformans  Wound Culture 5/23:  Multiple organisms present CSF Culture 5/24:  Abundant  Cryptococcus neoformans  Quantiferon TB 5/26:  Negative  CSF Culture 5/27:  Cryptococcus neoformans  BAL RML 5/28:  Cryptococcus neoformans / AFB smear negative & culture pending Tracheal Aspirate AFB 5/30 >>> Smear negative / Ctx pending Blood Cultures x2 5/30:  Negative  Stool Culture 5/31:  Negative  Stool Gastrointestinal Panel 5/31:  Negative Stool C diff 5/31:  Negative  CSF Culture 6/1 >> Encapsulated yeast seen  ANTIBIOTICS: Triumeq 5/23 - 5/24 Flagyl 5/23 - 5/24 Vancomycin 5/23 - 5/24 Doxycycline 5/24 - 5/25 Rocephin 5/23 - 5/30 Augmentin 5/24 - 5/25 Bactrim 5/23 >> Flucytosine 5/24 >> Valtrex 5/24 >> Azithromycin qweek 5/24 >> Ambisome 5/24 >>  SIGNIFICANT EVENTS: 5/22 - Admit with perirectal abscess 6/08 - Mental status change, concern for CSF leak with drain disconnection > reconnected with improvement  ASSESSMENT/PLAN:   31 y.o. male with sepsis secondary to perirectal abscess and disseminated cryptococcal infection. Appreciate assistance from neurology, neurosurgery, and infectious diseases. Encephalopathy has some waxing and waning.  Disseminated cryptococcal infection: Plan ABX as above, D18/x  ID following, appreciate input   Cryptococcal meningoencephalitis Plan Drain per NSGY  Antimicrobials as above  Acute hypoxic respiratory failure Secondary to cryptococcal pneumonia.  -extubated 6/5 Plan Pulmonary hygiene as able - IS, mobilize  Flutter valve as able  Intermittent CXR  Acute encephalopathy: Likely multifactorial from delirium as well as meningoencephalitis and Basal ganglia infarct Has resultant deconditioning  -neurology following. Infarct likely a complication of the meningoencephalitis  Plan Supportive care Could transfer to CIR when drain is out AND antimicrobials transitioned   Perirectal abscess: Status post drainage. Plan BID dressing changes WOC following   Fluid and electrolyte imbalance  - resolved AKI - hypomagnesemia  -  Hyponatremia Plan Trend BMP / urinary output Replace electrolytes as indicated Avoid nephrotoxic agents, ensure adequate renal perfusion  HIV/AIDS; Genital herpes:  Plan Completed acyclovir 6/6  Continue prophylaxis with bactrim + azithromycin  Further rec's per ID, holding ARV's given CM infection  Diarrhea: Stool culture negative. Infectious workup negative. Plan Strict I&O's  Monitor stool output   Anemia: Secondary to anemia of chronic disease.  Plan Trend CBC  Transfuse per ICU guidelines    Prophylaxis:  SCDs, Heparin [place] q8hr, & Pepcid via tube qhs. Diet: Dysphagia 2 diet per speech, with close supervision Code Status:  Full Code per previous physician discussions.   Disposition:  Patient to remain in  the ICU while lumbar drain in place. PT, OT, and speech therapy consulted. Family Update: No family at bedside am 6/9.  Will follow up on PM rounds.     Noe Gens, NP-C [facility] Pgr: [phone] or if no answer [phone] 01/07/2017, 7:14 AM   Attending Note:  I have examined patient, reviewed labs, studies and notes. I have discussed the case with B Ollis, and I agree with the data and plans as amended above. 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 Experienced acute respiratory failure requiring mechanical ventilation due to his encephalopathy. He has now been extubated. He has waxing and waning mental status. His lumbar CSF drain will be removed today. [name] undergone a swallowing evaluation but did not pass so a gastric tube will be placed to facilitate feeding and medications. We will continue his antibiotics and adjust as per ID recommendations. Suspect that he can move to stepdown if no clinical change after lumbar drain removal. He will be good candidate for CIR.   Baltazar Apo, MD, PhD 01/07/2017, 3:11 PM [facility] [phone] or if no answer  [phone]

## 2017-01-07 NOTE — Progress Notes (Signed)
Patient becomes HTNive at times, seems to correlate with discomfort. Ultram had been previously ordered but this lowers seizure threshold. [name] already at increased risk for seizures given his strokes and recent meningitis. I will d/c Ultram. Will order fentanyl PRN for now and observe.

## 2017-01-07 NOTE — Progress Notes (Signed)
Cortrak Tube Team Note:  Consult received to place a Cortrak feeding tube.   A 10 F Cortrak tube was placed in the L nare and secured with a nasal bridle at 85 cm. Per the Cortrak monitor reading the tube tip is located at the pyloric junction.   No x-ray is required. RN may begin using tube.  Patient had been given TF recs if he had been unable to tolerate oral intake after extubation.   If pt unable to swallow post extubation recommend: Jevity 1.2 @ 60 ml/hr (1440 ml/day) 30 ml Prostat BID Provides: 1928 kcal, 110 grams protein, and 1166 ml free water.    Will place these. RD can follow up next week and make adjustments as indicated.   If the tube becomes dislodged please keep the tube and contact the Cortrak team at [email or web address] (password [email or web address]) for replacement.  If after hours and replacement cannot be delayed, place a NG tube and confirm placement with an abdominal x-ray.   Burtis Junes RD, LDN, CNSC Clinical Nutrition Pager: [phone] 01/07/2017 2:56 PM

## 2017-01-07 NOTE — Progress Notes (Signed)
Occupational Therapy Treatment Patient Details Name: William Pena MRN: [id number] DOB: Nov 09, 1985 Today's Date: 01/07/2017    History of present illness Patient is a 31 y/o male presents with sepsis secondary to perirectal abscess s/p I&D 5/23 and disseminated cryptococcal infection including meningitis, bacteremia, and likely pneumonia. Intubated 5/28-6/5. 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 Second MRI- extension of the diffusion restriction in the left basal ganglia and right basal ganglia. s/p Lumbar drain 5/30. PMH includes advanced hiv disease   OT comments  Progressed with skilled intervention concerning B UE functional use and tone management. Pt demonstrates decreased tone in R UE this session indicated by decreased effort required to obtain full PROM and pt's ability to maintain R elbow extension for increased time. Pt additionally seen for B soft elbow splint check to assess for safety and skin breakdown potential. B splints removed for 15 minutes with no redness or pressure areas noted and reapplied splints with elbows on stretch at end of session. Educated Physiological scientist concerning progress as well as purpose and wear schedule of splints. Additionally requested RN complete skin check for pressure areas each shift.    Follow Up Recommendations  SNF;Supervision/Assistance - 24 hour;Other (comment)    Equipment Recommendations  Other (comment) (TBD at next venue of care)    Recommendations for Other Services Rehab consult    Precautions / Restrictions Precautions Precautions: Fall Precaution Comments: lumbar drain Required Braces or Orthoses: Other Brace/Splint (2 new soft elbow splints to help keep elbows extended) Restrictions Weight Bearing Restrictions: No       Mobility Bed Mobility                  Transfers                      Balance                                            ADL either performed or assessed with clinical judgement   ADL Overall ADL's : Needs assistance/impaired                                       General ADL Comments: Pt continues to require total assist for ADL participation. Decreased verbalization and engagement with therapy session. B soft elbow splints removed and skin inspected with no redness and all indentations disappearing withing 4-5 minutes.      Vision   Vision Assessment?: Vision impaired- to be further tested in functional context Additional Comments: R gaze more evident this session as pt unable to cross midline gaze with multimodal cues.    Perception     Praxis      Cognition Arousal/Alertness: Awake/alert Behavior During Therapy: Flat affect Overall Cognitive Status: Impaired/Different from baseline Area of Impairment: Orientation;Attention;Memory;Following commands;Safety/judgement;Awareness;Problem solving                 Orientation Level: Time;Situation;Place Current Attention Level: Focused   Following Commands: Follows one step commands inconsistently Safety/Judgement: Decreased awareness of deficits;Decreased awareness of safety Awareness: Intellectual Problem Solving: Slow processing;Decreased initiation;Difficulty sequencing General Comments: One verbalization this session with significantly increased time when asked if PROM/sustained stretch was causing any pain (pt responded "no"). Pt making eye contact but unable to cross midline  gaze to the L for eye contact with therapist.         Exercises Exercises: General Upper Extremity General Exercises - Upper Extremity Elbow Extension: PROM;Both;10 reps;Supine (sustained stretch) Wrist Extension: PROM;Both;10 reps (sustained stretch) Composite Extension: PROM;Both;5 reps (requiring techniques to inhibit tone) Other Exercises Other Exercises: Pt seen for B soft elbow splint check. L UE IV site remains troublesome and  educated RN to complete frequent checks of positioning to prevent skin breakdown. Removed B splints for approximately 15 minutes for PROM and prolonged stretch with tone management techniques. R UE with decreased tone as compared with OT evaluation. No redness noted when splints removed and all indentations resolved within 4-5 minutes. Educated RN on splint wear, purpose, and skin checks each shift.   Shoulder Instructions       General Comments BP elevated, RN aware    Pertinent Vitals/ Pain       Pain Assessment: Faces Faces Pain Scale: No hurt  Home Living Family/patient expects to be discharged to:: Inpatient rehab Living Arrangements: Alone                               Additional Comments: Pt recently incarcerated, but no longer and now homeless.      Prior Functioning/Environment Level of Independence: Independent            Frequency  Min 3X/week        Progress Toward Goals  OT Goals(current goals can now be found in the care plan section)  Progress towards OT goals: Not progressing toward goals - comment (decreased engagement)  Acute Rehab OT Goals Patient Stated Goal: per mother to eat pizza OT Goal Formulation: With patient/family Time For Goal Achievement: 01/18/17 Potential to Achieve Goals: Good ADL Goals Pt Will Perform Grooming: with mod assist;sitting Pt/caregiver will Perform Home Exercise Program: Both right and left upper extremity;With written HEP provided (PROM) Additional ADL Goal #1: Pt will demonstrate sustained attention in a non-distracting environment during morning ADL routine with no more than 1 verbal cue.  Additional ADL Goal #2: Pt will identify 3/5 objects to the L with no more than 1 verbal cue.  Additional ADL Goal #3: Pt will complete bed mobility in preparation for ADL tasks with min assist. Additional ADL Goal #4: Pt will tolerate wearing of Bil UE soft splints to help prevent contractures  Plan Discharge plan  remains appropriate    Co-evaluation                 AM-PAC PT "6 Clicks" Daily Activity     Outcome Measure   Help from another person eating meals?: Total Help from another person taking care of personal grooming?: Total Help from another person toileting, which includes using toliet, bedpan, or urinal?: Total Help from another person bathing (including washing, rinsing, drying)?: Total Help from another person to put on and taking off regular upper body clothing?: Total Help from another person to put on and taking off regular lower body clothing?: Total 6 Click Score: 6    End of Session Equipment Utilized During Treatment: Other (comment) (B soft elbow splints)  OT Visit Diagnosis: Muscle weakness (generalized) (M62.81);Cognitive communication deficit (R41.841);Other abnormalities of gait and mobility (R26.89);Pain Pain - Right/Left: Left Pain - part of body: Shoulder   Activity Tolerance Patient tolerated treatment well   Patient Left in bed   Nurse Communication Mobility status (Purpose and wear schedule as well as  skin checks each shift)        Time: 9390-3009 OT Time Calculation (min): 24 min  Charges: OT General Charges $OT Visit: 1 Procedure OT Treatments $Therapeutic Exercise: 8-22 mins $Orthotics/Prosthetics Check: 8-22 mins  Norman Herrlich, MS OTR/L  Pager: [place] A Yaden Seith 01/07/2017, 10:53 AM

## 2017-01-08 ENCOUNTER — Inpatient Hospital Stay (HOSPITAL_COMMUNITY): Payer: Medicaid Other

## 2017-01-08 LAB — RENAL FUNCTION PANEL
ALBUMIN: 2.8 g/dL — AB (ref 3.5–5.0)
Anion gap: 6 (ref 5–15)
BUN: 26 mg/dL — AB (ref 6–20)
CALCIUM: 9.2 mg/dL (ref 8.9–10.3)
CHLORIDE: 102 mmol/L (ref 101–111)
CO2: 22 mmol/L (ref 22–32)
CREATININE: 0.95 mg/dL (ref 0.61–1.24)
Glucose, Bld: 119 mg/dL — ABNORMAL HIGH (ref 65–99)
PHOSPHORUS: 4.8 mg/dL — AB (ref 2.5–4.6)
Potassium: 3.6 mmol/L (ref 3.5–5.1)
Sodium: 130 mmol/L — ABNORMAL LOW (ref 135–145)

## 2017-01-08 LAB — GLUCOSE, CAPILLARY
GLUCOSE-CAPILLARY: 120 mg/dL — AB (ref 65–99)
GLUCOSE-CAPILLARY: 102 mg/dL — AB (ref 65–99)
GLUCOSE-CAPILLARY: 88 mg/dL (ref 65–99)
Glucose-Capillary: 103 mg/dL — ABNORMAL HIGH (ref 65–99)
Glucose-Capillary: 91 mg/dL (ref 65–99)

## 2017-01-08 LAB — BASIC METABOLIC PANEL
Anion gap: 6 (ref 5–15)
BUN: 26 mg/dL — AB (ref 6–20)
CALCIUM: 9.2 mg/dL (ref 8.9–10.3)
CHLORIDE: 102 mmol/L (ref 101–111)
CO2: 22 mmol/L (ref 22–32)
CREATININE: 1 mg/dL (ref 0.61–1.24)
Glucose, Bld: 117 mg/dL — ABNORMAL HIGH (ref 65–99)
Potassium: 3.6 mmol/L (ref 3.5–5.1)
SODIUM: 130 mmol/L — AB (ref 135–145)

## 2017-01-08 LAB — CBC WITH DIFFERENTIAL/PLATELET
BASOS ABS: 0 10*3/uL (ref 0.0–0.1)
BASOS PCT: 0 %
Eosinophils Absolute: 0 10*3/uL (ref 0.0–0.7)
Eosinophils Relative: 0 %
HCT: 27.1 % — ABNORMAL LOW (ref 39.0–52.0)
Hemoglobin: 8.8 g/dL — ABNORMAL LOW (ref 13.0–17.0)
LYMPHS PCT: 10 %
Lymphs Abs: 0.7 10*3/uL (ref 0.7–4.0)
MCH: 27.3 pg (ref 26.0–34.0)
MCHC: 32.5 g/dL (ref 30.0–36.0)
MCV: 84.2 fL (ref 78.0–100.0)
Monocytes Absolute: 0.5 10*3/uL (ref 0.1–1.0)
Monocytes Relative: 7 %
NEUTROS ABS: 6.3 10*3/uL (ref 1.7–7.7)
Neutrophils Relative %: 83 %
PLATELETS: 209 10*3/uL (ref 150–400)
RBC: 3.22 MIL/uL — AB (ref 4.22–5.81)
RDW: 16.2 % — ABNORMAL HIGH (ref 11.5–15.5)
WBC: 7.6 10*3/uL (ref 4.0–10.5)

## 2017-01-08 LAB — MAGNESIUM: MAGNESIUM: 1.6 mg/dL — AB (ref 1.7–2.4)

## 2017-01-08 MED ORDER — POTASSIUM CHLORIDE 20 MEQ/15ML (10%) PO SOLN
40.0000 meq | Freq: Once | ORAL | Status: AC
  Administered 2017-01-08: 40 meq via ORAL
  Filled 2017-01-08: qty 30

## 2017-01-08 MED ORDER — HYDRALAZINE HCL 20 MG/ML IJ SOLN
5.0000 mg | INTRAMUSCULAR | Status: DC | PRN
  Administered 2017-01-11: 5 mg via INTRAVENOUS
  Filled 2017-01-08: qty 1

## 2017-01-08 MED ORDER — MAGNESIUM SULFATE 4 GM/100ML IV SOLN
4.0000 g | Freq: Once | INTRAVENOUS | Status: AC
  Administered 2017-01-08: 4 g via INTRAVENOUS
  Filled 2017-01-08: qty 100

## 2017-01-08 NOTE — Progress Notes (Signed)
Pharmacy Consult:  Electrolyte Replacement  68 yom with advanced HIV disease currently on treatment for disseminated cryptococcal disease with bacteremia and meningitis. Pharmacy consulted to manage electrolytes. SCr up slightly to 1.02. Continues on saline boluses before and after ambisome dose  K down to 3.6  Mag low at 1.6   Plan: KCl 40 mEq per tube x1 today Magnesium 4gm IV x 1  Monitor K and Mag closely and order additional replacement prn  Sloan Leiter, PharmD, BCPS Clinical Pharmacist Clinical phone 01/08/2017 until 3:30 - #[phone] After hours, please call #[phone] 01/08/2017 11:03 AM

## 2017-01-08 NOTE — Progress Notes (Signed)
[facility] Note  ADMISSION DATE:  12/20/2016  CONSULTATION DATE:  12/22/2016  REFERRING MD :  Carlyle Basques, M.D.  CHIEF COMPLAINT:  Disseminated cryptococcus  Presenting HPI:  31 y.o. male with HIV/AIDS (last CD4 1 w/ VL 81K). Just started back on ART 2 months ago however he was incarcerated and reported he was not given his medications. He was admitted on 5/22 for perirectal abscess. During admission he was also noted to have HA, fever, chills, night sweats neck pain and LAN. ID was consulted and recommended imaging of chest, LP, and cultures be sent from perirectal I&D. Since that time CT chest results: "Bilateral airspace disease with a small cavitary lesion in the right upper lobe. Findings compatible with pneumonia.Trace right pleural fluid versus right pleural thickening". Also his serum cryptococcal antigenemia titer was elevated raising the possibility of disseminated cryptococcal infection. Including lung involvement. We have been asked to see by infectious disease for bronchoscopy and washings in effort to help determine if CT changes reflect mTB OR possibly pulmonary cryptococcal disease. Initially unable to perform initial bronchoscopy due to hypoxia. 5/28 called by primary for altered LOC. He became very lethargic/obtunded requiring CT and LP, but airway was an issue so PCCM asked to see for ICU transfer and intubation.  Subjective:  RN reports elevated BP overnight.  ? If related to pain.  No other acute events.  Mental status improved this am.        Temp:  [97.7 F (36.5 C)-99.1 F (37.3 C)] 99.1 F (37.3 C) (06/10 0730) Pulse Rate:  [61-94] 69 (06/10 0745) Resp:  [9-23] 15 (06/10 0745) BP: (141-208)/(72-117) 172/88 (06/10 0745) SpO2:  [99 %-100 %] 100 % (06/10 0745) Weight:  [128 lb 4.9 oz (58.2 kg)] 128 lb 4.9 oz (58.2 kg) (06/10 0500)   Intake/Output Summary (Last 24 hours) at 01/08/17 0751 Last data filed at 01/08/17 0700  Gross per 24 hour   Intake          3523.33 ml  Output             2625 ml  Net           898.33 ml   General: chronically ill appearing young adult male in NAD HEENT: MM pink/moist, good dentition  PSY: flat affect Neuro: awake, intermittently will follow commands, recognizes his mother, speech clear but slow to answer CV: s1s2 rrr, no m/r/g PULM: even/non-labored, lungs bilaterally coarse  QJ:FHLK, non-tender, bsx4 active  Extremities: warm/dry, no edema  Skin: no rashes or lesions, extensive tattoos   CBC Recent Labs     01/06/17  0332  01/07/17  0257  01/08/17  0206  WBC  4.7  5.5  7.6  HGB  9.4*  9.1*  8.8*  HCT  29.9*  27.9*  27.1*  PLT  285  244  209    Coag's Recent Labs     01/06/17  1654  APTT  41*  INR  1.09    BMET Recent Labs     01/06/17  0332  01/07/17  0257  01/08/17  0206  NA  129*  129*  131*  130*  130*  K  4.3  4.0  4.0  3.6  3.6  CL  98*  100*  100*  102  102  CO2  23  21*  21*  22  22  BUN  19  22*  20  26*  26*  CREATININE  1.07  1.12  1.11  1.00  0.95  GLUCOSE  98  90  92  117*  119*    Electrolytes Recent Labs     01/06/17  0332  01/07/17  0257  01/08/17  0206  CALCIUM  9.6  9.3  9.3  9.2  9.2  MG  1.7  1.7  1.6*  PHOS  5.6*  5.4*  4.8*    Sepsis Markers No results for input(s): PROCALCITON, O2SATVEN in the last 72 hours.  Invalid input(s): LACTICACIDVEN  ABG No results for input(s): PHART, PCO2ART, PO2ART in the last 72 hours.  Liver Enzymes Recent Labs     01/06/17  0332  01/07/17  0257  01/08/17  0206  ALBUMIN  3.0*  3.0*  2.8*    Cardiac Enzymes No results for input(s): TROPONINI, PROBNP in the last 72 hours.  Glucose Recent Labs     01/06/17  1340  01/07/17  1541  01/07/17  1939  01/07/17  2328  01/08/17  0416  01/08/17  0740  GLUCAP  103*  142*  117*  115*  120*  102*    Imaging Ct Head Wo Contrast  Result Date: 01/06/2017 CLINICAL DATA:  Worsening responsiveness.  Meningitis. EXAM: CT HEAD  WITHOUT CONTRAST TECHNIQUE: Contiguous axial images were obtained from the base of the skull through the vertex without intravenous contrast. COMPARISON:  MRI 12/27/2016. CT 12/27/2016. Multiple previous MR and CT exams. FINDINGS: Brain: Since the previous exams, the patient has developed low level hyperdensity within the caudate and putamen on the left and at the left frontoparietal vertex. These findings probably represent petechial bleeding within areas previously shown to be affected by acute infarction. No evidence of macro hemorrhage/hematoma. Other areas of deep brain and cerebral hemispheric infarction are an apparent by CT. No sign of shift, hydrocephalus or extra-axial fluid collection. Vascular: Negative Skull: Negative Sinuses/Orbits: Clear/normal Other: None IMPRESSION: Development of low level hyperdensity in the left caudate, left putamen and at the left posterior frontal vertex, most consistent with petechial hemorrhage within areas of previously demonstrated infarction. No macro hemorrhage/hematoma. No new insult identified otherwise. No mass effect or hydrocephalus. Electronically Signed   By: Nelson Chimes M.D.   On: 01/06/2017 13:04   Dg Chest Port 1 View  Result Date: 01/08/2017 CLINICAL DATA:  HIV disease with meningitis. EXAM: PORTABLE CHEST 1 VIEW COMPARISON:  January 07, 2017 FINDINGS: Feeding tube tip is below the diaphragm. Contrast is seen in the stomach. There is no edema or consolidation. Heart is slightly enlarged with pulmonary vascularity within normal limits. No adenopathy. No bone lesions. IMPRESSION: Stable cardiac prominence. No edema or consolidation. Feeding tube tip below diaphragm. Electronically Signed   By: Lowella Grip III M.D.   On: 01/08/2017 07:26   Dg Chest Port 1 View  Result Date: 01/07/2017 CLINICAL DATA:  Respiratory failure.  HIV positive EXAM: PORTABLE CHEST 1 VIEW COMPARISON:  January 02, 2017 FINDINGS: Endotracheal tube and nasogastric tube have been  removed. No pneumothorax. There is no edema or consolidation. Heart is slightly enlarged with pulmonary vascularity within normal limits. No adenopathy. No bone lesions. IMPRESSION: No pneumothorax. Mild cardiac enlargement. No edema or consolidation. Electronically Signed   By: Lowella Grip III M.D.   On: 01/07/2017 07:56   Dg Abd Portable 1v  Result Date: 01/06/2017 CLINICAL DATA:  Initial evaluation for bowel trouble, perirectal abscess, history of HIV EXAM: PORTABLE ABDOMEN - 1 VIEW COMPARISON:  Prior CT from 12/20/2016. FINDINGS: Bowel gas pattern within normal limits  without obstruction or ileus. No abnormal bowel wall thickening. No appreciable free air on this limited supine view the abdomen. Catheter tubing overlies the lower mid abdomen. No soft tissue mass or abnormal calcification. Visualized lung bases are clear. Visualized osseous structures unremarkable. IMPRESSION: Nonobstructive bowel gas pattern with no radiographic evidence for acute intra-abdominal process. Electronically Signed   By: Jeannine Boga M.D.   On: 01/06/2017 22:22     LINES/TUBES: OETT 8.0 5/28 - 6/5 OGT 5/28 - 6/5 Lumbar Drain 5/28 >> Foley 5/28 >> PIV    IMAGING/STUDIES: CT ABD/PELVIS W/ CONTRAST 5/23: 1. Edema tracking along the right side of the gluteal cleft is similar in appearance to the prior study. This reflected a subcutaneous abscess on the prior study. This most likely reflects a persistent small subcutaneous abscess. 2. Diffuse soft tissue edema tracking about the anorectal canal, without additional abscess. Diffuse presacral soft tissue inflammation noted. 3. Scrotal wall edema, worse on the right, with small bilateral hydroceles. 4. Mild right basilar airspace opacity raises concern for pneumonia. 5. Mild wall thickening along the distal sigmoid colon and rectum, raising concern for proctitis. 6. Enlarged azygoesophageal recess, retroperitoneal, pelvic sidewall and bilateral inguinal  nodes seen, measuring up to 1.7 cm in short axis. This likely reflects the underlying infection. 7. Cholelithiasis.  Gallbladder otherwise unremarkable. CT HEAD W/O 5/29:  Abnormal low-density in the left basal ganglia which may reflect worsening infection or acute infarction. MRI BRAIN W/O 5/29:  Worsening/progression of small vessel occlusive infarctions along the base the brain bilaterally with new involvement affecting a large portion of the corpus striatum on the left. Worsening of small vessel occlusive infarctions along the superior surface of the brain an underlying white matter as well. Findings are consistent with progressive complication of meningitis. CT HEAD 6/8: development of low level hyperdensity in the left caudate, left putamen and at the left posterior frontal vertex, most consistent with petechial hemorrhage within areas of previously demonstrated infarction.  No macro hemorrhage/hematoma, no new insult identified, no mass effect or hydrocephalus.    MICROBIOLOGY: MRSA PCR 5/23:  Negative  Blood Cultures x2 5/22:  2/2 Bottles Positive Cryptococcus neoformans  Blood Cultures x2 5/23:  2/2 Bottles Positive Cryptococcus neoformans  Wound Culture 5/23:  Multiple organisms present CSF Culture 5/24:  Abundant Cryptococcus neoformans  Quantiferon TB 5/26:  Negative  CSF Culture 5/27:  Cryptococcus neoformans  BAL RML 5/28:  Cryptococcus neoformans / AFB smear negative & culture pending Tracheal Aspirate AFB 5/30 >>> Smear negative / Ctx pending Blood Cultures x2 5/30:  Negative  Stool Culture 5/31:  Negative  Stool Gastrointestinal Panel 5/31:  Negative Stool C diff 5/31:  Negative  CSF Culture 6/1 >> Encapsulated yeast seen  ANTIBIOTICS: Triumeq 5/23 - 5/24 Flagyl 5/23 - 5/24 Vancomycin 5/23 - 5/24 Doxycycline 5/24 - 5/25 Rocephin 5/23 - 5/30 Augmentin 5/24 - 5/25 Bactrim 5/23 >> Flucytosine 5/24 >> Valtrex 5/24 >> Azithromycin qweek 5/24 >> Ambisome 5/24  >>  SIGNIFICANT EVENTS: 5/22 - Admit with perirectal abscess 6/08 - Mental status change, concern for CSF leak with drain disconnection > reconnected with improvement  ASSESSMENT/PLAN:   31 y.o. male with sepsis secondary to perirectal abscess and disseminated cryptococcal infection. Appreciate assistance from neurology, neurosurgery, and infectious diseases. Encephalopathy has some waxing and waning.  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  -extubated  6/5 Plan Pulmonary hygiene as able - IS, mobilize  Flutter valve as able  Intermittent CXR  Acute encephalopathy: Likely multifactorial from delirium as well as meningoencephalitis and Basal ganglia infarct Has resultant deconditioning  -neurology following. Infarct likely a complication of the meningoencephalitis  Plan Supportive care Consider transfer to CIR / rehab when able   Perirectal abscess: Status post drainage. Plan BID dressing changes  WOC following   Fluid and electrolyte imbalance  - resolved AKI - hypomagnesemia  - Hyponatremia Plan Trend BMP / urinary output Replace electrolytes as indicated Avoid nephrotoxic agents, ensure adequate renal perfusion  HIV/AIDS; Genital herpes:  Plan Completed acyclovir 6/6  Continue prophylaxis with bactrim + azithro  Further rec's per ID Hold ARV's given CM infection   Diarrhea: Stool culture negative. Infectious workup negative. Plan Monitor stool output  Follow I/O's  Anemia: Secondary to anemia of chronic disease.  Plan Trend CBC    Prophylaxis:  SCDs, Heparin [place] q8hr, & Pepcid via tube qhs. Diet: NPO, TF per panda Code Status:  Full Code per previous physician discussions.   Disposition:  SDU. PT, OT, and speech therapy consulted. Family Update:  Mother updated on am rounds.    Noe Gens, NP-C [facility] Pgr: [phone] or if no answer [phone] 01/08/2017, 7:51 AM    Attending Note:  I have examined patient, reviewed labs, studies and notes. I have discussed the case with B Ollis, and I agree with the data and plans as amended above. 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 Course complicated by acute respiratory failure due to altered mental status that required mechanical ventilation. He had a lumbar CSF drain to depressurize. [name] now extubated and the drain has been removed. He failed a swallowing evaluation and has a gastric tube. On exam today he is actually a bit more awake, he is interacting more and is answered some simple questions. His upper extremities are flexed with some rigidity. Lungs are clear. We will continue his current antimicrobials, appreciate ID assistance. He looks stable for transfer to a stepdown unit today.  Baltazar Apo, MD, PhD 01/08/2017, 12:27 PM [facility] [phone] or if no answer [phone]

## 2017-01-08 NOTE — Progress Notes (Signed)
Neurology Progress Note  Subjective: He has tolerated removal of his lumbar drain well. A nasogastric tube was placed yesterday to rule out medications and nutrition. His nurse reports this morning he has been able to follow some simple midline and appendicular commands. [name] also been able to state his name and answer some questions though verbal output remains quite limited. He remains abulic. Effort continues to fluctuate quite significantly. He does not participate with review of systems.  Medications reviewed and reconciled.   Pertinent meds: Amphotericin B 240 mg daily Azithromycin 1200 mg weekly Flucytosine 1500 mg every 6 hours Bactrim 10 mL daily    Current Meds:   Current Facility-Administered Medications:  .  0.9 %  sodium chloride infusion, , Intravenous, Continuous, Raylene Miyamoto, MD, Last Rate: 50 mL/hr at 01/07/17 0500 .  acetaminophen (TYLENOL) suppository 650 mg, 650 mg, Rectal, Q6H PRN, Anders Simmonds, MD, 650 mg at 01/03/17 2200 .  amantadine (SYMMETREL) 50 MG/5ML solution 50 mg, 50 mg, Oral, BID WC, 50 mg at 01/08/17 1320 **FOLLOWED BY** [START ON 01/10/2017] amantadine (SYMMETREL) 50 MG/5ML solution 100 mg, 100 mg, Oral, BID WC, Darrel Reach, MD .  amphotericin B liposome (AMBISOME) 240 mg in dextrose 5 % 500 mL IVPB, 4 mg/kg, Intravenous, Q24H, Rumbarger, Valeda Malm, RPH, Stopped at 01/08/17 1327 .  azithromycin (ZITHROMAX) tablet 1,200 mg, 1,200 mg, Oral, Weekly, Collene Gobble, MD, 1,200 mg at 01/06/17 0943 .  docusate sodium (COLACE) capsule 100 mg, 100 mg, Oral, BID PRN, Collene Gobble, MD .  famotidine (PEPCID) tablet 20 mg, 20 mg, Oral, QHS, Collene Gobble, MD, 20 mg at 01/07/17 2102 .  feeding supplement (ENSURE ENLIVE) (ENSURE ENLIVE) liquid 237 mL, 237 mL, Oral, BID BM, Raylene Miyamoto, MD, Stopped at 01/06/17 1400 .  feeding supplement (JEVITY 1.2 CAL) liquid 1,000 mL, 1,000 mL, Per Tube, Continuous, Byrum, Rose Fillers, MD, Last Rate: 60 mL/hr  at 01/08/17 0000, 1,000 mL at 01/08/17 0000 .  feeding supplement (PRO-STAT SUGAR FREE 64) liquid 30 mL, 30 mL, Oral, BID, Collene Gobble, MD, 30 mL at 01/08/17 0925 .  fentaNYL (SUBLIMAZE) injection 50 mcg, 50 mcg, Intravenous, Q2H PRN, Darrel Reach, MD, 50 mcg at 01/07/17 2101 .  flucytosine (ANCOBON) capsule 1,500 mg, 1,500 mg, Per Tube, Q6H, Chesley Mires, MD, 1,500 mg at 01/08/17 1321 .  heparin injection 5,000 Units, 5,000 Units, Subcutaneous, Q8H, Javier Glazier, MD, 5,000 Units at 01/08/17 [phone] .  hydrALAZINE (APRESOLINE) injection 5 mg, 5 mg, Intravenous, Q4H PRN, Ollis, Brandi L, NP .  lidocaine (PF) (XYLOCAINE) 1 % injection 5 mL, 5 mL, Other, Once, Dixon, [employer], NP .  magnesium sulfate IVPB 4 g 100 mL, 4 g, Intravenous, Once, Priscella Mann, RPH, Last Rate: 50 mL/hr at 01/08/17 1327, 4 g at 01/08/17 1327 .  MEDLINE mouth rinse, 15 mL, Mouth Rinse, BID, Collene Gobble, MD, 15 mL at 01/08/17 0928 .  [DISCONTINUED] ondansetron (ZOFRAN-ODT) disintegrating tablet 4 mg, 4 mg, Oral, Q6H PRN **OR** ondansetron (ZOFRAN) injection 4 mg, 4 mg, Intravenous, Q6H PRN, Rayburn, Kelly A, PA-C, 4 mg at 12/22/16 1939 .  polyethylene glycol (MIRALAX / GLYCOLAX) packet 17 g, 17 g, Oral, Daily PRN, Rayburn, Kelly A, PA-C .  sodium chloride 0.9 % bolus 500 mL, 500 mL, Intravenous, Q24H, Nestor, Sonia Baller, MD, Stopped at 01/08/17 1025 .  sodium chloride 0.9 % bolus 500 mL, 500 mL, Intravenous, Q24H, Nestor, Sonia Baller, MD, Last Rate:  500 mL/hr at 01/08/17 1327, 500 mL at 01/08/17 1327 .  sulfamethoxazole-trimethoprim (BACTRIM,SEPTRA) 200-40 MG/5ML suspension 10 mL, 10 mL, Per Tube, Daily, Dixon, Melton Krebs, NP, 10 mL at 01/08/17 0925  Objective:  Temp:  [98 F (36.7 C)-99.1 F (37.3 C)] 98.6 F (37 C) (06/10 1119) Pulse Rate:  [61-94] 78 (06/10 1300) Resp:  [9-23] 15 (06/10 1300) BP: (141-188)/(70-113) 160/105 (06/10 1300) SpO2:  [99 %-100 %] 99 % (06/10 1300) Weight:  [58.2 kg  (128 lb 4.9 oz)] 58.2 kg (128 lb 4.9 oz) (06/10 0500)  General: WDWN AA man lying in ICU bed. [name] alert. He fixes and tracks well. He was saying something that was difficult to understand. Verbal upper remains limited. He stuck his tongue out to command but I couldn't get him to do anything else for me today.  HEENT: Neck supple. No LAD. Sclerae are anicteric. There is no conjunctival injection.  CV: Regular, no murmur. Carotid pulses are 2+ and symmetric with no bruits. Distal pulses 2+ and symmetric.  Lungs: CTAB on anterior exam.   Extremities: No C/C/E. Numerous tattoos noted over body and face.  Neuro: MS: As noted above.  CN: Pupils are equal and reactive from 3-->2 mm bilaterally. Eyes are conjugate. Corneals are intact and symmetric. His face appears grossly symmetric. The remainder of his cranial nerve exam is limited as he does note participate.  Motor: Normal bulk. Tone is increased in both arms which are flexed against his trunk. No tremor or other abnormal movements are observed. He has some spontaneous movements of all 4 limbs, with relatively less movement of left arm observed. Sensation: Withdrawal noted to nailbed pressure x4.  DTRs: 3+, symmetric. Toes are mute bilaterally.  Coordination/gait: These cannot be assessed as he does not participate with the exam.   Labs: Lab Results  Component Value Date   WBC 7.6 01/08/2017   HGB 8.8 (L) 01/08/2017   HCT 27.1 (L) 01/08/2017   PLT 209 01/08/2017   GLUCOSE 119 (H) 01/08/2017   GLUCOSE 117 (H) 01/08/2017   TRIG 144 12/26/2016   ALT 42 12/28/2016   AST 30 12/28/2016   NA 130 (L) 01/08/2017   NA 130 (L) 01/08/2017   K 3.6 01/08/2017   K 3.6 01/08/2017   CL 102 01/08/2017   CL 102 01/08/2017   CREATININE 0.95 01/08/2017   CREATININE 1.00 01/08/2017   BUN 26 (H) 01/08/2017   BUN 26 (H) 01/08/2017   CO2 22 01/08/2017   CO2 22 01/08/2017   INR 1.09 01/06/2017   CBC Latest Ref Rng & Units 01/08/2017 01/07/2017 01/06/2017   WBC 4.0 - 10.5 K/uL 7.6 5.5 4.7  Hemoglobin 13.0 - 17.0 g/dL 8.8(L) 9.1(L) 9.4(L)  Hematocrit 39.0 - 52.0 % 27.1(L) 27.9(L) 29.9(L)  Platelets 150 - 400 K/uL 209 244 285    No results found for: HGBA1C Lab Results  Component Value Date   ALT 42 12/28/2016   AST 30 12/28/2016   ALKPHOS 135 (H) 12/28/2016   BILITOT 0.4 12/28/2016   Radiology:  There is no new neuroimaging for review.   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 Continue amphotericin B and flucytosine as per ID recs. Lumbar drain removed this morning, will continue to monitor. Appreciate neurosurgery and ID assistance.   2. Acute ischemic stroke: This is acute, due to small vessel involvement from his cryptococcal infection. Continue with antifungal therapy as above. No role for antiplatelet agents or  statins in this setting.  3. Acute encephalopathy: This is multifactorial in etiology with contributions from cryptococcal meningitis and acute strokes with additional contributions likely from metabolic derangements, medication effect, and respiratory failure. Overall he has improved but now has clear abulia and cognitive dysfunction (impairments in sustained attention, speed of processing, and executive function) that are consistent with his L basal ganglia infarct. Continue supportive care. Continue to optimize metabolic status as you are. Continue to treat underlying infections as you are. Minimize sedation as tolerated. Avoid anything with strong anticholinergic properties that this is likely to worsen his encephalopathy. Amantadine was started today at 50 mg twice daily, 8 AM and 12 PM. This will be increased to 100 mg twice daily after 2 days. Hopefully this will help with his abulia and allow him to become more engaged so that he can participate better with rehabilitation.  4. 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 CD4 count this admission is 21 with high viral load greater than 1  million. Currently not receiving antiretroviral therapy in the setting of cryptococcal treatment. Appreciate ID assistance.   D/w patient's mother at the bedside. She is in agreement with the plan as noted. She was given the chance to ask any questions and these were addressed to her satisfaction.   Melba Coon, MD [facility]

## 2017-01-08 NOTE — Progress Notes (Signed)
Patient ID: William Pena, male   DOB: 06-20-1986, 31 y.o.   MRN: [id number] Vital signs are stable Back had some drainage yesterday that required a singular stable Currently remains dry Neurologically unchanged, some occasional following of commands with trace movement of the upper and lower extremities

## 2017-01-09 DIAGNOSIS — D649 Anemia, unspecified: Secondary | ICD-10-CM

## 2017-01-09 DIAGNOSIS — G9340 Encephalopathy, unspecified: Secondary | ICD-10-CM

## 2017-01-09 LAB — CBC WITH DIFFERENTIAL/PLATELET
BASOS PCT: 1 %
Basophils Absolute: 0 10*3/uL (ref 0.0–0.1)
EOS ABS: 0 10*3/uL (ref 0.0–0.7)
Eosinophils Relative: 1 %
HEMATOCRIT: 27.3 % — AB (ref 39.0–52.0)
HEMOGLOBIN: 9 g/dL — AB (ref 13.0–17.0)
LYMPHS ABS: 0.8 10*3/uL (ref 0.7–4.0)
Lymphocytes Relative: 18 %
MCH: 27.6 pg (ref 26.0–34.0)
MCHC: 33 g/dL (ref 30.0–36.0)
MCV: 83.7 fL (ref 78.0–100.0)
Monocytes Absolute: 0.4 10*3/uL (ref 0.1–1.0)
Monocytes Relative: 10 %
NEUTROS ABS: 3.2 10*3/uL (ref 1.7–7.7)
NEUTROS PCT: 70 %
Platelets: 179 10*3/uL (ref 150–400)
RBC: 3.26 MIL/uL — AB (ref 4.22–5.81)
RDW: 16.5 % — ABNORMAL HIGH (ref 11.5–15.5)
WBC: 4.5 10*3/uL (ref 4.0–10.5)

## 2017-01-09 LAB — GLUCOSE, CAPILLARY
GLUCOSE-CAPILLARY: 106 mg/dL — AB (ref 65–99)
Glucose-Capillary: 106 mg/dL — ABNORMAL HIGH (ref 65–99)
Glucose-Capillary: 88 mg/dL (ref 65–99)
Glucose-Capillary: 96 mg/dL (ref 65–99)
Glucose-Capillary: 98 mg/dL (ref 65–99)
Glucose-Capillary: 99 mg/dL (ref 65–99)

## 2017-01-09 LAB — RENAL FUNCTION PANEL
ALBUMIN: 2.8 g/dL — AB (ref 3.5–5.0)
ANION GAP: 6 (ref 5–15)
BUN: 24 mg/dL — ABNORMAL HIGH (ref 6–20)
CALCIUM: 9 mg/dL (ref 8.9–10.3)
CO2: 22 mmol/L (ref 22–32)
Chloride: 104 mmol/L (ref 101–111)
Creatinine, Ser: 0.88 mg/dL (ref 0.61–1.24)
GFR calc non Af Amer: >60 mL/min (ref >60)
Glucose, Bld: 101 mg/dL — ABNORMAL HIGH (ref 65–99)
POTASSIUM: 3.7 mmol/L (ref 3.5–5.1)
Phosphorus: 4.3 mg/dL (ref 2.5–4.6)
SODIUM: 132 mmol/L — AB (ref 135–145)

## 2017-01-09 LAB — CSF CULTURE: CULTURE: NO GROWTH

## 2017-01-09 LAB — CSF CULTURE W GRAM STAIN

## 2017-01-09 LAB — MAGNESIUM: Magnesium: 2.1 mg/dL (ref 1.7–2.4)

## 2017-01-09 MED ORDER — ACETAMINOPHEN 160 MG/5ML PO SOLN: 650.0000 mg | Freq: Four times a day (QID) | ORAL | Status: DC | PRN

## 2017-01-09 MED ORDER — POTASSIUM CHLORIDE 20 MEQ/15ML (10%) PO SOLN: 40.0000 meq | Freq: Once | ORAL | Status: DC

## 2017-01-09 MED ORDER — ENSURE ENLIVE PO LIQD: 237.0000 mL | Freq: Two times a day (BID) | ORAL | Status: DC

## 2017-01-09 MED ORDER — FENTANYL CITRATE (PF) 100 MCG/2ML IJ SOLN
25.0000 ug | INTRAMUSCULAR | Status: DC | PRN
  Administered 2017-01-09 – 2017-01-10 (×2): 25 ug via INTRAVENOUS
  Filled 2017-01-09 (×2): qty 2

## 2017-01-09 MED ORDER — ORAL CARE MOUTH RINSE
15.0000 mL | Freq: Two times a day (BID) | OROMUCOSAL | Status: DC
  Administered 2017-01-09 – 2017-02-06 (×48): 15 mL via OROMUCOSAL

## 2017-01-09 MED ORDER — POLYETHYLENE GLYCOL 3350 17 G PO PACK
17.0000 g | PACK | Freq: Every day | ORAL | Status: DC | PRN
  Administered 2017-01-09: 17 g
  Filled 2017-01-09: qty 1

## 2017-01-09 MED ORDER — POTASSIUM CHLORIDE 20 MEQ/15ML (10%) PO SOLN
40.0000 meq | Freq: Once | ORAL | Status: AC
  Administered 2017-01-09: 40 meq
  Filled 2017-01-09: qty 30

## 2017-01-09 MED ORDER — FAMOTIDINE 20 MG PO TABS
20.0000 mg | ORAL_TABLET | Freq: Every day | ORAL | Status: DC
  Administered 2017-01-09 – 2017-01-10 (×2): 20 mg
  Filled 2017-01-09 (×2): qty 1

## 2017-01-09 MED ORDER — PRO-STAT SUGAR FREE PO LIQD
30.0000 mL | Freq: Two times a day (BID) | ORAL | Status: DC
  Administered 2017-01-09 – 2017-01-11 (×4): 30 mL
  Filled 2017-01-09 (×4): qty 30

## 2017-01-09 MED ORDER — SODIUM CHLORIDE 0.9% FLUSH
3.0000 mL | Freq: Two times a day (BID) | INTRAVENOUS | Status: DC
  Administered 2017-01-09 – 2017-02-02 (×21): 3 mL via INTRAVENOUS

## 2017-01-09 NOTE — Progress Notes (Signed)
Rehab admissions - After meeting with our inpatient rehab leadership team, we feel that discharging to SNF initially rather than admitting to inpatient rehab first and then going to SNF is the best option.  We feel that patient will need SNF placement even after a potential inpatient rehab stay.  He has no one to care for him after rehab.  His mom cannot take him home.  Mom understands and expects that patient will need SNF placement.  I will not plan to admit to inpatient rehab.  Recommend pursuit of SNF placement once patient is medically ready for discharge.  Call me for questions.  #[phone]

## 2017-01-09 NOTE — Progress Notes (Signed)
Subjective: Lumbar drain was removed on Saturday. Cortrak has been placed. He continues to improve gradually. Over the weekend he at times could answer some questions with one-word answers and at other times simply stared at people when they attempted to communicate. He would intermittently follow commands.   Objective: Current vital signs: BP 133/73 (BP Location: Right Leg)   Pulse 74   Temp 98.8 F (37.1 C) (Axillary)   Resp 11   Ht 5' 6" (1.676 m)   Wt 61.6 kg (135 lb 12.9 oz)   SpO2 99%   BMI 21.92 kg/m  Vital signs in last 24 hours: Temp:  [98.4 F (36.9 C)-99.6 F (37.6 C)] 98.8 F (37.1 C) (06/11 0820) Pulse Rate:  [56-99] 74 (06/11 0404) Resp:  [9-24] 11 (06/11 0404) BP: (123-160)/(70-106) 133/73 (06/11 0820) SpO2:  [96 %-100 %] 99 % (06/11 0404) Weight:  [61.6 kg (135 lb 12.9 oz)-63.2 kg (139 lb 5.3 oz)] 61.6 kg (135 lb 12.9 oz) (06/11 0500)  Intake/Output from previous day: 06/10 0701 - 06/11 0700 In: 4130 [I.V.:1150; NG/GT:1380; IV Piggyback:1600] Out: 2450 [Urine:2450] Intake/Output this shift: No intake/output data recorded. Nutritional status: Diet NPO time specified  Neurologic Exam: Ment: Opens eyes to voice and attends to examiner. Does not follow commands or attempt to communicate.  CN: PERRL. Eyes conjugate; will visually track examiner as he moves. Face symmetric with improved facial motor tone.  Motor: Increased tone bilateral upper extremities. Decreased tone bilateral lower extremities. Moves to noxious x 4 without definite asymmetry.  Sensory: Reacts to tactile stimulation x 4.  Reflexes: 3+ in upper and lower extremities.  Cerebellar/Gait: Unable to assess  Lab Results: Results for orders placed or performed during the hospital encounter of 12/20/16 (from the past 48 hour(s))  Glucose, capillary     Status: Abnormal   Collection Time: 01/07/17  3:41 PM  Result Value Ref Range   Glucose-Capillary 142 (H) 65 - 99 mg/dL  Glucose, capillary      Status: Abnormal   Collection Time: 01/07/17  7:39 PM  Result Value Ref Range   Glucose-Capillary 117 (H) 65 - 99 mg/dL  Glucose, capillary     Status: Abnormal   Collection Time: 01/07/17 11:28 PM  Result Value Ref Range   Glucose-Capillary 115 (H) 65 - 99 mg/dL  Magnesium     Status: Abnormal   Collection Time: 01/08/17  2:06 AM  Result Value Ref Range   Magnesium 1.6 (L) 1.7 - 2.4 mg/dL  CBC with Differential/Platelet     Status: Abnormal   Collection Time: 01/08/17  2:06 AM  Result Value Ref Range   WBC 7.6 4.0 - 10.5 K/uL   RBC 3.22 (L) 4.22 - 5.81 MIL/uL   Hemoglobin 8.8 (L) 13.0 - 17.0 g/dL   HCT 27.1 (L) 39.0 - 52.0 %   MCV 84.2 78.0 - 100.0 fL   MCH 27.3 26.0 - 34.0 pg   MCHC 32.5 30.0 - 36.0 g/dL   RDW 16.2 (H) 11.5 - 15.5 %   Platelets 209 150 - 400 K/uL   Neutrophils Relative % 83 %   Neutro Abs 6.3 1.7 - 7.7 K/uL   Lymphocytes Relative 10 %   Lymphs Abs 0.7 0.7 - 4.0 K/uL   Monocytes Relative 7 %   Monocytes Absolute 0.5 0.1 - 1.0 K/uL   Eosinophils Relative 0 %   Eosinophils Absolute 0.0 0.0 - 0.7 K/uL   Basophils Relative 0 %   Basophils Absolute 0.0 0.0 - 0.1 K/uL    Renal function panel     Status: Abnormal   Collection Time: 01/08/17  2:06 AM  Result Value Ref Range   Sodium 130 (L) 135 - 145 mmol/L   Potassium 3.6 3.5 - 5.1 mmol/L   Chloride 102 101 - 111 mmol/L   CO2 22 22 - 32 mmol/L   Glucose, Bld 119 (H) 65 - 99 mg/dL   BUN 26 (H) 6 - 20 mg/dL   Creatinine, Ser 0.95 0.61 - 1.24 mg/dL   Calcium 9.2 8.9 - 10.3 mg/dL   Phosphorus 4.8 (H) 2.5 - 4.6 mg/dL   Albumin 2.8 (L) 3.5 - 5.0 g/dL   GFR calc non Af Amer >60 >60 mL/min   GFR calc Af Amer >60 >60 mL/min    Comment: (NOTE) The eGFR has been calculated using the CKD EPI equation. This calculation has not been validated in all clinical situations. eGFR's persistently <60 mL/min signify possible Chronic Kidney Disease.    Anion gap 6 5 - 15  Basic metabolic panel     Status: Abnormal    Collection Time: 01/08/17  2:06 AM  Result Value Ref Range   Sodium 130 (L) 135 - 145 mmol/L   Potassium 3.6 3.5 - 5.1 mmol/L   Chloride 102 101 - 111 mmol/L   CO2 22 22 - 32 mmol/L   Glucose, Bld 117 (H) 65 - 99 mg/dL   BUN 26 (H) 6 - 20 mg/dL   Creatinine, Ser 1.00 0.61 - 1.24 mg/dL   Calcium 9.2 8.9 - 10.3 mg/dL   GFR calc non Af Amer >60 >60 mL/min   GFR calc Af Amer >60 >60 mL/min    Comment: (NOTE) The eGFR has been calculated using the CKD EPI equation. This calculation has not been validated in all clinical situations. eGFR's persistently <60 mL/min signify possible Chronic Kidney Disease.    Anion gap 6 5 - 15  Glucose, capillary     Status: Abnormal   Collection Time: 01/08/17  4:16 AM  Result Value Ref Range   Glucose-Capillary 120 (H) 65 - 99 mg/dL  Glucose, capillary     Status: Abnormal   Collection Time: 01/08/17  7:40 AM  Result Value Ref Range   Glucose-Capillary 102 (H) 65 - 99 mg/dL  Glucose, capillary     Status: Abnormal   Collection Time: 01/08/17 11:18 AM  Result Value Ref Range   Glucose-Capillary 103 (H) 65 - 99 mg/dL  Glucose, capillary     Status: None   Collection Time: 01/08/17  3:48 PM  Result Value Ref Range   Glucose-Capillary 88 65 - 99 mg/dL  Glucose, capillary     Status: None   Collection Time: 01/08/17  8:06 PM  Result Value Ref Range   Glucose-Capillary 91 65 - 99 mg/dL  Glucose, capillary     Status: None   Collection Time: 01/09/17 12:23 AM  Result Value Ref Range   Glucose-Capillary 96 65 - 99 mg/dL   Comment 1 Notify RN    Comment 2 Document in Chart   Magnesium     Status: None   Collection Time: 01/09/17  3:28 AM  Result Value Ref Range   Magnesium 2.1 1.7 - 2.4 mg/dL  CBC with Differential/Platelet     Status: Abnormal   Collection Time: 01/09/17  3:28 AM  Result Value Ref Range   WBC 4.5 4.0 - 10.5 K/uL   RBC 3.26 (L) 4.22 - 5.81 MIL/uL   Hemoglobin 9.0 (L) 13.0 - 17.0 g/dL     HCT 27.3 (L) 39.0 - 52.0 %   MCV 83.7  78.0 - 100.0 fL   MCH 27.6 26.0 - 34.0 pg   MCHC 33.0 30.0 - 36.0 g/dL   RDW 16.5 (H) 11.5 - 15.5 %   Platelets 179 150 - 400 K/uL   Neutrophils Relative % 70 %   Neutro Abs 3.2 1.7 - 7.7 K/uL   Lymphocytes Relative 18 %   Lymphs Abs 0.8 0.7 - 4.0 K/uL   Monocytes Relative 10 %   Monocytes Absolute 0.4 0.1 - 1.0 K/uL   Eosinophils Relative 1 %   Eosinophils Absolute 0.0 0.0 - 0.7 K/uL   Basophils Relative 1 %   Basophils Absolute 0.0 0.0 - 0.1 K/uL  Renal function panel     Status: Abnormal   Collection Time: 01/09/17  3:28 AM  Result Value Ref Range   Sodium 132 (L) 135 - 145 mmol/L   Potassium 3.7 3.5 - 5.1 mmol/L   Chloride 104 101 - 111 mmol/L   CO2 22 22 - 32 mmol/L   Glucose, Bld 101 (H) 65 - 99 mg/dL   BUN 24 (H) 6 - 20 mg/dL   Creatinine, Ser 0.88 0.61 - 1.24 mg/dL   Calcium 9.0 8.9 - 10.3 mg/dL   Phosphorus 4.3 2.5 - 4.6 mg/dL   Albumin 2.8 (L) 3.5 - 5.0 g/dL   GFR calc non Af Amer >60 >60 mL/min   GFR calc Af Amer >60 >60 mL/min    Comment: (NOTE) The eGFR has been calculated using the CKD EPI equation. This calculation has not been validated in all clinical situations. eGFR's persistently <60 mL/min signify possible Chronic Kidney Disease.    Anion gap 6 5 - 15  Glucose, capillary     Status: None   Collection Time: 01/09/17  4:03 AM  Result Value Ref Range   Glucose-Capillary 88 65 - 99 mg/dL   Comment 1 Notify RN    Comment 2 Document in Chart   Glucose, capillary     Status: Abnormal   Collection Time: 01/09/17  8:17 AM  Result Value Ref Range   Glucose-Capillary 106 (H) 65 - 99 mg/dL    Recent Results (from the past 240 hour(s))  CSF culture with Stat gram stain     Status: None   Collection Time: 12/30/16  2:14 PM  Result Value Ref Range Status   Specimen Description CSF  Final   Special Requests Immunocompromised  Final   Gram Stain   Final    WBC PRESENT, PREDOMINANTLY MONONUCLEAR ENCAPSULATED YEAST SEEN CYTOSPIN SMEAR    Culture RARE  CRYPTOCOCCUS NEOFORMANS  Final   Report Status 01/05/2017 FINAL  Final  Fungus Culture With Stain     Status: None (Preliminary result)   Collection Time: 12/30/16  2:14 PM  Result Value Ref Range Status   Fungus Stain Final report  Final    Comment: (NOTE) Performed At: [facility] [place], [place] [id number] Lindon Romp MD KZ:[id number]    Fungus (Mycology) Culture PENDING  Incomplete   Fungal Source PENDING  Incomplete  Fungus Culture Result     Status: None   Collection Time: 12/30/16  2:14 PM  Result Value Ref Range Status   Result 1 Comment  Final    Comment: (NOTE) Fungal elements, such as arthroconidia, hyphal fragments, chlamydoconidia, observed. Performed At: [facility] [place], [place] [id number] Lindon Romp MD RA:[id number]   CSF culture with Stat gram  stain     Status: None   Collection Time: 01/05/17  9:00 AM  Result Value Ref Range Status   Specimen Description CSF  Final   Special Requests Immunocompromised  Final   Gram Stain   Final    WBC PRESENT, PREDOMINANTLY MONONUCLEAR YEAST CRITICAL RESULT CALLED TO, READ BACK BY AND VERIFIED WITH: Barnett Abu, RN AT 1112 ON 01/05/17 BY C. JESSUP, MLT. CYTOSPIN SMEAR    Culture NO GROWTH 3 DAYS  Final   Report Status 01/09/2017 FINAL  Final    Lipid Panel No results for input(s): CHOL, TRIG, HDL, CHOLHDL, VLDL, LDLCALC in the last 72 hours.  Studies/Results: Dg Chest Port 1 View  Result Date: 01/08/2017 CLINICAL DATA:  HIV disease with meningitis. EXAM: PORTABLE CHEST 1 VIEW COMPARISON:  January 07, 2017 FINDINGS: Feeding tube tip is below the diaphragm. Contrast is seen in the stomach. There is no edema or consolidation. Heart is slightly enlarged with pulmonary vascularity within normal limits. No adenopathy. No bone lesions. IMPRESSION: Stable cardiac prominence. No edema or consolidation. Feeding tube tip below diaphragm. Electronically Signed   By: Lowella Grip III M.D.   On: 01/08/2017 07:26    Medications:  Scheduled: . amantadine  50 mg Oral BID WC   Followed by  . [START ON 01/10/2017] amantadine  100 mg Oral BID WC  . azithromycin  1,200 mg Oral Weekly  . famotidine  20 mg Oral QHS  . feeding supplement (ENSURE ENLIVE)  237 mL Oral BID BM  . feeding supplement (PRO-STAT SUGAR FREE 64)  30 mL Oral BID  . flucytosine  1,500 mg Per Tube Q6H  . heparin subcutaneous  5,000 Units Subcutaneous Q8H  . lidocaine (PF)  5 mL Other Once  . mouth rinse  15 mL Mouth Rinse BID  . potassium chloride  40 mEq Oral Once  . sulfamethoxazole-trimethoprim  10 mL Per Tube Daily   Continuous: . sodium chloride 50 mL/hr at 01/08/17 2020  . amphotericin  B  Liposome (AMBISOME) ADULT IV Stopped (01/08/17 1327)  . feeding supplement (JEVITY 1.2 CAL) 1,000 mL (01/08/17 2124)  . sodium chloride 500 mL (01/09/17 0919)  . 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 On amphotericin B and flucytosine. No longer has lumbar drain.  2. 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 The infarctions involve the bilateral basal ganglia, left worse than right, the frontal deep white matter bilaterally, and portions of the cerebral cortices of the frontal lobes along the high convexities bilaterally. No role for antiplatelet agents or statins given that mechanism is occlusive disease secondary to fungal infiltration/inflammation.  3. HIV/AIDS. CD4 count this admission was 21. Viral load was > 1 million. Currently not receiving antiretroviral therapy in the setting of cryptococcal treatment. Appreciate ID assistance.  4. Encephalopathy. Gradually improving but suspect that he will  plateau at a level of cognition and motor function that is significantly lower than his pre-admission baseline. The encephalopathy is multifactorial, with cryptococcal meningoencephalitis and multifocal strokes being the predominant etiological factors.     LOS: 19 days   _0  signed: Dr. Kerney Elbe 01/09/2017  11:20 AM

## 2017-01-09 NOTE — Progress Notes (Signed)
  Speech Language Pathology Treatment: Dysphagia;Cognitive-Linquistic  Patient Details Name: William Pena MRN: [id number] DOB: [birth year] Today's Date: 01/09/2017 Time: 6314-9702 SLP Time Calculation (min) (ACUTE ONLY): 12 min  Assessment / Plan / Recommendation Clinical Impression  Pt demonstrates improvement in ability to follow one step direction for compensatory strategies with swallow. Pt is able to orally manipulate ice and puree with slow but not delayed oral transit. Swallow swiftly triggered, but multiple swallows noted with pudding with intermittent wet vocal quality. When instructed to clear his throat the pt consistently did so with a 5 second delay. Force of volitional cough/throat clear is poor, but involuntary cough is much stronger. Recommend pt proceed with ice chip trials with RN, will f/u with repeat MBS tomorrow for potential diet initiation.   HPI HPI: Patient is a 31 y/o male presents with sepsis secondary to perirectal abscess s/p I&D 5/23 and disseminated cryptococcal infection including meningitis, bacteremia, and likely pneumonia. Intubated 5/28-6/5. 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 Second MRI- extension of the diffusion restriction in the left basal ganglia and right basal ganglia. s/p Lumbar drain 5/30. PMH includes advanced hiv disease      SLP Plan  MBS       Recommendations  Diet recommendations: Other(comment) (ice chips')                Oral Care Recommendations: Oral care QID Follow up Recommendations: Inpatient Rehab SLP Visit Diagnosis: Dysphagia, oropharyngeal phase (R13.12) Plan: MBS       GO               [name], MA CCC-SLP [phone]  [name] 01/09/2017, 2:06 PM

## 2017-01-09 NOTE — Care Management Note (Signed)
Case Management Note  Patient Details  Name: William Pena MRN: [id number] Date of Birth: 02/15/1986  Subjective/Objective:   Patient was recently incarcerated, but no longer in custody. 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 Patient extubated 6/5. Multiple antiinfectives. Neuro status not at baseline, Patient has cortrak in place.   6/11 Tomi Bamberger RN, BSN -  He conts on amphotericin B and flucytosine, no longer hsa lumbar drain but area is being packed. Subacute ischemic infarctions left worse than right secondary to cryptococcal involvement. 042, - currently not receiving with ampho b,  Has encephalopathy.  Pt eval rec CIR, CIR  coordiantor states per  Rehab leadership , they feel patient needs SNF . CSW following for placement.               Action/Plan: NCM will follow along with CSW for dc needs.  Expected Discharge Date:  12/22/16               Expected Discharge Plan:  Skilled Nursing Facility  In-House Referral:  Clinical Social Work  Discharge planning Services  CM Consult  Post Acute Care Choice:    Choice offered to:     DME Arranged:    DME Agency:     HH Arranged:    [place] Agency:     Status of Service:  In process, will continue to follow  If discussed at Long Length of Stay Meetings, dates discussed:    Additional Comments:  Zenon Mayo, RN 01/09/2017, 3:39 PM

## 2017-01-09 NOTE — Progress Notes (Signed)
[facility] for Infectious Disease    Date of Admission:  12/20/2016   Total days of antibiotics 19         19 - L-ampho / flucytosine        Azith / Bactrim OI proph          ID: William Pena is a 31 y.o. male with disseminated CM.   Principal Problem:   Perirectal abscess Active Problems:   Chronic pain   AIDS (acquired immune deficiency syndrome) (HCC)   Hyponatremia   Normocytic anemia   Elevated LFTs   Pulmonary infiltrates   Hypomagnesemia   Encephalopathy acute   SIRS (systemic inflammatory response syndrome) (HCC)   Acute respiratory failure (HCC)   Rectal abscess   Cryptococcal meningoencephalitis (HCC)   Cryptococcosis (HCC)   Cavitary pneumonia   Diffuse lymphadenopathy   Drug rash   Elevated intracranial pressure   HIV disease (HCC)   Lymphadenopathy of head and neck   HIV (human immunodeficiency virus infection) ([place])   Meningitis   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 Able to verbalize in short sentences today that he wanted something to drink.   Medications:  . amantadine  50 mg Oral BID WC   Followed by  . [START ON 01/10/2017] amantadine  100 mg Oral BID WC  . azithromycin  1,200 mg Oral Weekly  . famotidine  20 mg Oral QHS  . feeding supplement (ENSURE ENLIVE)  237 mL Oral BID BM  . feeding supplement (PRO-STAT SUGAR FREE 64)  30 mL Oral BID  . flucytosine  1,500 mg Per Tube Q6H  . heparin subcutaneous  5,000 Units Subcutaneous Q8H  . lidocaine (PF)  5 mL Other Once  . mouth rinse  15 mL Mouth Rinse BID  . sulfamethoxazole-trimethoprim  10 mL Per Tube Daily    Objective: Vital signs in last 24 hours: Temp:  [98.4 F (36.9 C)-99.6 F (37.6 C)] 98.8 F (37.1 C) (06/11 0820) Pulse Rate:  [56-99] 74 (06/11 0404) Resp:  [9-24] 11 (06/11 0404) BP: (123-160)/(70-106) 133/73 (06/11 0820) SpO2:  [96 %-100 %] 99 % (06/11 0404) Weight:  [135 lb 12.9 oz (61.6 kg)-139 lb 5.3 oz (63.2 kg)] 135  lb 12.9 oz (61.6 kg) (06/11 0500)  General appearance: alert and in no distress. Sitting in bed quietly. Upper extremity flexion continues.  Eyes: conjunctivae/corneas clear. PERRL, EOM's intact.  Neck: no carotid bruit, no JVD, supple, symmetrical, trachea midline and improved lymphadenopathy. Resp: Clear to auscultation Cardio: regular rate and rhythm, S1, S2 normal, no murmur, click, rub or gallop Extremities: extremities normal, atraumatic, no cyanosis or edema Skin: color, texture, turgor normal. No rashes or lesions Neurologic: Alert. Only gross motor movement to UE demonstrated. Speech still slurred but improved processing is very slow.   Lab Results  Recent Labs  01/08/17 0206 01/09/17 0328  WBC 7.6 4.5  HGB 8.8* 9.0*  HCT 27.1* 27.3*  NA 130*  130* 132*  K 3.6  3.6 3.7  CL 102  102 104  CO2 _0 BUN 26*  26* 24*  CREATININE 1.00  0.95 0.88   Liver Panel  Recent Labs  01/08/17 0206 01/09/17 0328  ALBUMIN 2.8* 2.8*   Sedimentation Rate No results for input(s): ESRSEDRATE in the last 72 hours. C-Reactive Protein No results for input(s): CRP in the last 72 hours.  Microbiology: 01/05/17:  -WBC 18, 95% lymph -RBC 23 -glucose  40  -protein 96 -culture >> rare yeast on GS  Studies/Results: Dg Chest Port 1 View  Result Date: 01/08/2017 CLINICAL DATA:  HIV disease with meningitis. EXAM: PORTABLE CHEST 1 VIEW COMPARISON:  January 07, 2017 FINDINGS: Feeding tube tip is below the diaphragm. Contrast is seen in the stomach. There is no edema or consolidation. Heart is slightly enlarged with pulmonary vascularity within normal limits. No adenopathy. No bone lesions. IMPRESSION: Stable cardiac prominence. No edema or consolidation. Feeding tube tip below diaphragm. Electronically Signed   By: Lowella Grip III M.D.   On: 01/08/2017 07:26     Assessment/Plan: CM c/b small vessel infarcts=  - Day 19 of L-ampho and flucytosine - extended induction with  continually +CSF cultures - CSF 6/7 > Yeast on GS but NG on day 3 of culture. If growth observed again will need to repeat LP this week - Continually needing mag replenishment  - Neuro status continues to improve. Started on Amantadine today per neurology.   HIV Disease= - tx deferred due to CM treatment - OI proph -continue with bactrim ss daily + Azith weekly   Perirectal abscess=  - received a short course of abtx after debridement - wound and follow up care per surgical team   Pneumonia, Cryptococcal=  - 5/28 BAL Cx >>Cryptococcal Neoformans confirmed  - mtB PCR and AFB negative - improving with treatment   Updated mother with the above plan. Inpatient rehab team recommending SNF placement instead of inpatient rehab admission due to lack of caregiver support.   [name], MSN, NP-C [facility] for Infectious [place] Cell: [phone] Pager: [phone]  01/09/2017, 11:10 AM

## 2017-01-09 NOTE — Progress Notes (Signed)
Occupational Therapy Treatment Patient Details Name: William Pena MRN: [id number] DOB: July 07, 1986 Today's Date: 01/09/2017    History of present illness Patient is a 31 y/o male presents with sepsis secondary to perirectal abscess s/p I&D 5/23 and disseminated cryptococcal infection including meningitis, bacteremia, and likely pneumonia. Intubated 5/28-6/5. Pt found to have acute ischemic strokes. 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 Second MRI- extension of the diffusion restriction in the left basal ganglia and right basal ganglia. s/p Lumbar drain 5/30. PMH includes advanced hiv disease   OT comments  Pt demonstrates improved ability to participate and interact today.  He makes occasional eye contact.  He will attempt to follow appendicular commands, but is limited by attentional  Deficits, apraxia and initiation deficits.  He was able to assist minimally with bed mobility to move to EOB, and was able to sit x 10 mins with min - mod A.  He was limited by pain, and was able to state "my ass hurts", as well as "I want pudding".  [name] able to move out of flexor spasticity with bil. UEs and facilitation, but when he attempts to engage in activity with UEs, he pulls into flexor patterns.   Will continue to follow acutely.   Follow Up Recommendations  SNF;Supervision/Assistance - 24 hour;Other (comment)    Equipment Recommendations  None recommended by OT    Recommendations for Other Services      Precautions / Restrictions Precautions Precautions: Fall Required Braces or Orthoses: Other Brace/Splint (bil. elbow splints ) Restrictions Weight Bearing Restrictions: No       Mobility Bed Mobility Overal bed mobility: Needs Assistance Bed Mobility: Supine to Sit;Sit to Supine Rolling: Max assist   Supine to sit: Max assist Sit to supine: Max assist   General bed mobility comments: inhibitory techniques to reduce spasticity.   Pt able to assist with lifting and lowering trunk.    Transfers                 General transfer comment: unable to attempt this date     Balance Overall balance assessment: Needs assistance Sitting-balance support: Feet supported Sitting balance-Leahy Scale: Poor Sitting balance - Comments: Pt sat EOB x 10 mins with min A - mod A (fluctuating).  Attempted to use Lt UE as a support, but unable.  He complained of pain in peri area with corresponding increase in spasticity noted  Postural control: Posterior lean                                 ADL either performed or assessed with clinical judgement   ADL Overall ADL's : Needs assistance/impaired Eating/Feeding: NPO   Grooming: Wash/dry hands;Wash/dry face;Total assistance;Bed level Grooming Details (indicate cue type and reason): pt will attempt to rub lotion into hands, but as he attempts to move, he pulls into flexor patterns and is severely apraxic.  When hand over hand assist provided, he is able to assist minimally, but then moves into flexion                             Functional mobility during ADLs: Maximal assistance       Vision   Additional Comments: Pt will make brief eye contact    Perception     Praxis      Cognition Arousal/Alertness: Awake/alert Behavior During Therapy: Flat affect  Overall Cognitive Status: Impaired/Different from baseline Area of Impairment: Attention;Following commands;Problem solving                 Orientation Level: Disoriented to;Place;Time;Situation Current Attention Level: Focused   Following Commands: Follows one step commands inconsistently Safety/Judgement: Decreased awareness of deficits;Decreased awareness of safety Awareness: Intellectual Problem Solving: Slow processing;Decreased initiation;Requires verbal cues;Requires tactile cues General Comments: Pt with signficiantly delayed responses, and demonstrates signficant apraxia.  He  will attempt to assist to follow commands, but signficant difficulty due to attentional deficits, difficulties with initiating, and apraxia        Exercises Exercises: Other exercises General Exercises - Upper Extremity Shoulder Flexion: PROM;Both;10 reps;Seated Elbow Extension: PROM;Both;10 reps;Seated Other Exercises Other Exercises: tone inhibition techniques utilized followed PROM of bil. UEs.  He was able to achieve full PROM of hands, wrist, elbows except last ~10-20* bil.) and shoulders limited to ~110*.    Other Exercises: Elbow splints removed and replaced at end of session    Shoulder Instructions       General Comments VSS    Pertinent Vitals/ Pain       Pain Assessment: Faces Faces Pain Scale: Hurts even more Pain Location: bottom "my ass hurts" while sitting  EOB  Pain Descriptors / Indicators: Grimacing;Guarding;Restless Pain Intervention(s): Repositioned;Limited activity within patient's tolerance  Home Living                                          Prior Functioning/Environment              Frequency  Min 3X/week        Progress Toward Goals  OT Goals(current goals can now be found in the care plan section)  Progress towards OT goals: Progressing toward goals     Plan Discharge plan needs to be updated    Co-evaluation                 AM-PAC PT "6 Clicks" Daily Activity     Outcome Measure   Help from another person eating meals?: Total Help from another person taking care of personal grooming?: Total Help from another person toileting, which includes using toliet, bedpan, or urinal?: Total Help from another person bathing (including washing, rinsing, drying)?: Total Help from another person to put on and taking off regular upper body clothing?: Total Help from another person to put on and taking off regular lower body clothing?: Total 6 Click Score: 6    End of Session    OT Visit Diagnosis: Cognitive  communication deficit (R41.841);Apraxia (R48.2);Muscle weakness (generalized) (M62.81) Symptoms and signs involving cognitive functions: Cerebral infarction Pain - part of body:  (peri rectal area )   Activity Tolerance Patient limited by pain   Patient Left in bed;with call bell/phone within reach;with bed alarm set   Nurse Communication Mobility status        Time: 3734-2876 OT Time Calculation (min): 33 min  Charges: OT General Charges $OT Visit: 1 Procedure OT Treatments $Neuromuscular Re-education: 23-37 mins  [facility], OTR/L [phone]    Lucille Passy M 01/09/2017, 12:18 PM

## 2017-01-09 NOTE — Progress Notes (Signed)
Physical Therapy Treatment Patient Details Name: William Pena MRN: [id number] DOB: [birth year] Today's Date: 01/09/2017    History of Present Illness Patient is a 31 y/o male presents with sepsis secondary to perirectal abscess s/p I&D 5/23 and disseminated cryptococcal infection including meningitis, bacteremia, and likely pneumonia. Intubated 5/28-6/5. Pt found to have acute ischemic strokes. 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 Second MRI- extension of the diffusion restriction in the left basal ganglia and right basal ganglia. s/p Lumbar drain 5/30. PMH includes advanced hiv disease    PT Comments    Pt only occasionally follows 1 step command. Aroused with sitting up and maintained alertness for most of session. Good eye contact with occasional smile and 1 nod appropriately.   Follow Up Recommendations  CIR     Equipment Recommendations  Other (comment) (TBA in next venue)    Recommendations for Other Services       Precautions / Restrictions Precautions Precautions: Fall Required Braces or Orthoses: Other Brace/Splint (2 new soft elbow splints to help keep elbows extended) Restrictions Weight Bearing Restrictions: No    Mobility  Bed Mobility Overal bed mobility: Needs Assistance Bed Mobility: Supine to Sit;Sit to Supine     Supine to sit: +2 for physical assistance;Total assist Sit to supine: +2 for physical assistance;Total assist   General bed mobility comments: Assist with all aspects  Transfers                    Ambulation/Gait                 Stairs            Wheelchair Mobility    Modified Rankin (Stroke Patients Only) Modified Rankin (Stroke Patients Only) Pre-Morbid Rankin Score: No symptoms Modified Rankin: Severe disability     Balance Overall balance assessment: Needs assistance Sitting-balance support: No upper extremity supported;Single extremity  supported;Feet supported Sitting balance-Leahy Scale: Zero Sitting balance - Comments: Pt sat EOB x 20 minutes with mod to max assist. Worked on UE/LE ROM.  Worked on propping on each elbow.  Postural control: Posterior lean                                  Cognition Arousal/Alertness: Awake/alert Behavior During Therapy: Flat affect Overall Cognitive Status: Difficult to assess Area of Impairment: Orientation;Attention;Memory;Following commands;Safety/judgement;Awareness;Problem solving                   Current Attention Level: Focused   Following Commands: Follows one step commands inconsistently Safety/Judgement: Decreased awareness of deficits;Decreased awareness of safety Awareness: Intellectual Problem Solving: Slow processing;Decreased initiation;Difficulty sequencing General Comments: No verbalizations. Pt with eye contact and nodded once appropriately.      Exercises General Exercises - Upper Extremity Shoulder Flexion: PROM;Both;10 reps;Seated Elbow Extension: PROM;Both;10 reps;Seated    General Comments        Pertinent Vitals/Pain Pain Assessment: Faces Faces Pain Scale: No hurt    Home Living                      Prior Function            PT Goals (current goals can now be found in the care plan section) Progress towards PT goals: Not progressing toward goals - comment    Frequency    Min 3X/week      PT Plan Current plan  remains appropriate    Co-evaluation              AM-PAC PT "6 Clicks" Daily Activity  Outcome Measure  Difficulty turning over in bed (including adjusting bedclothes, sheets and blankets)?: Total Difficulty moving from lying on back to sitting on the side of the bed? : Total Difficulty sitting down on and standing up from a chair with arms (e.g., wheelchair, bedside commode, etc,.)?: Total Help needed moving to and from a bed to chair (including a wheelchair)?: Total Help needed walking  in hospital room?: Total Help needed climbing 3-5 steps with a railing? : Total 6 Click Score: 6    End of Session   Activity Tolerance: Patient tolerated treatment well Patient left: in bed;with bed alarm set Nurse Communication: Mobility status;Need for lift equipment PT Visit Diagnosis: Muscle weakness (generalized) (M62.81);Other symptoms and signs involving the nervous system (R29.898);Hemiplegia and hemiparesis Hemiplegia - Right/Left: Left Hemiplegia - dominant/non-dominant: Non-dominant Hemiplegia - caused by: Cerebral infarction     Time: 3212-2482 PT Time Calculation (min) (ACUTE ONLY): 27 min  Charges:  $Therapeutic Activity: 23-37 mins                    G Codes:       [facility] PT [place] 01/09/2017, 9:57 AM

## 2017-01-09 NOTE — Progress Notes (Signed)
Pharmacy Consult:  Electrolyte Replacement  67 yom with advanced HIV disease currently on treatment for disseminated cryptococcal disease with bacteremia and meningitis. Pharmacy consulted to manage electrolytes. SCr back down to 0.88. Continues on saline boluses before and after ambisome dose  K 3.7 << 3.6 s/p 40 mEq KCl on 6/10 Mg up to 2/1 << 1.6 s/p Mg 4g IV x 1 on 6/10  Plan: KCl 40 mEq per tube x1 today No Magnesium needed today Monitor K and Mag closely and order additional replacement prn  Thank you for allowing pharmacy to be a part of this patient's care.  Alycia Rossetti, PharmD, BCPS Clinical Pharmacist Pager: [phone] Clinical phone for 01/09/2017 from 7a-3:30p: [phone] If after 3:30p, please call main pharmacy at: [phone] 01/09/2017 11:21 AM

## 2017-01-09 NOTE — Progress Notes (Addendum)
PROGRESS NOTE   William Pena  IRJ:[id number]    DOB: [birth year]    DOA: 12/20/2016  PCP: [name], MD   I have briefly reviewed patients previous medical records in [facility].  Brief Narrative:  31 year old male with a PMH of HIV/AIDS (last CD4 on 12/21/16:21), just started back on ART 2 months PTA, however he was incarcerated and reported that he was not getting his medications, admitted on 5/22 for perirectal abscess. During admission he was also noted to have headache, fever, chills, night sweats, neck pain and lymphadenopathy. ID was consulted and recommended imaging of chest, LP and cultures be sent from perirectal I&D. Also his serum cryptococcal antigen titer was elevated raising the possibility of disseminated cryptococcal infection including lung involvement. Pulmonology was consulted by ID for bronchoscopy and washings in an effort to help determine if his CT changes reflect TB or possible pulmonary cryptococcal disease. Initially unable to perform bronchoscopy due to hypoxia. 5/28 patient had altered LOC, became very lethargic/obtunded requiring CT and LP, complicated by acute respiratory failure due to altered mental status that required mechanical ventilation. He had a lumbar CSF drain to depression arise. Now extubated, drain removed. Failed swallowing evaluation and has Cor trak forte tube feeds. Mental status slightly better but waxing and waning. ID, neurology, neurosurgery and CCM consulted during hospital course. Care transferred to stepdown and [place] on 01/09/17.  Assessment & Plan:   Principal Problem:   Perirectal abscess Active Problems:   Chronic pain   AIDS (acquired immune deficiency syndrome) (HCC)   Hyponatremia   Normocytic anemia   Elevated LFTs   Pulmonary infiltrates   Hypomagnesemia   Encephalopathy acute   SIRS (systemic inflammatory response syndrome) (HCC)   Acute respiratory failure (HCC)   Rectal abscess   Cryptococcal meningoencephalitis  (HCC)   Cryptococcosis (HCC)   Cavitary pneumonia   Diffuse lymphadenopathy   Drug rash   Elevated intracranial pressure   HIV disease (HCC)   Lymphadenopathy of head and neck   HIV (human immunodeficiency virus infection) ([place])   Meningitis   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 D20Rx. ID following. Lumbar drain removed 6/10. 2. Acute hypoxic respiratory failure secondary to cryptococcal pneumonia: Extubated 6/5. Breathing spontaneously and stable without hypoxia on room air. Patient unable to do IS or flutter valve due to mental status changes. Intermittent chest x-ray as needed. Chest x-ray 6/10: No edema or consolidation. 3. Acute encephalopathy: Multifactorial >cryptococcal meningitis, acute strokes, metabolic derangements, medication effect and respiratory failure. Neurology follow-up 6/10 appreciated >overall improved but now has clear abulia and cognitive dysfunction consistent with his left basal ganglia infarct. Supportive care, optimize metabolic status, treat infections as above, minimize sedation and avoid precipitating medications. Amantadine initiated by neurology and hopefully will help his abulia. 4. Acute ischemic stroke: Due to small vessel involvement from his cryptococcal infection. As per neurology, continue with antifungal treatment and no role for antiplatelet agents or statins in this setting. 5. HIV/AIDS: CD4 count this admission 21 and high viral load >1 million. Infectious disease following. Continue prophylaxis with Bactrim and azithromycin. ART not initiated due to see him infection. 6. Perirectal abscess: Status post I&D. Twice a day dressing changes. WOC following. 7. Genital herpes: Completed acyclovir 6 days. 8. Acute kidney injury: Resolved. 9. Hypomagnesemia: Replaced. 10. Hyponatremia/? SIADH:  Stable. 11. Diarrhea: Stool culture negative.? Resolved. 12. Anemia of chronic disease: Stable.  DVT prophylaxis: Subcutaneous heparin Code Status: Full Family Communication: Discussed with mother at bedside. Updated care and answered questions. Disposition: Continue treatment in the stepdown unit.   Consultants:  CCM-signed off Infectious disease Neurology Neurosurgery   Procedures:  OETT 8.0 5/28 - 6/5 OGT 5/28 - 6/5 Lumbar Drain 5/28 >>DC'ed 6/10 Foley 5/28 >> PIV  Cor trak>  Antimicrobials:  Triumeq 5/23 - 5/24 Flagyl 5/23 - 5/24 Vancomycin 5/23 - 5/24 Doxycycline 5/24 - 5/25 Rocephin 5/23 - 5/30 Augmentin 5/24 - 5/25 Bactrim 5/23 >> Flucytosine 5/24 >> Valtrex 5/24 >> Azithromycin qweek 5/24 >> Ambisome 5/24 >>   MICROBIOLOGY: MRSA PCR 5/23:  Negative  Blood Cultures x2 5/22:  2/2 Bottles Positive Cryptococcus neoformans  Blood Cultures x2 5/23:  2/2 Bottles Positive Cryptococcus neoformans  Wound Culture 5/23:  Multiple organisms present CSF Culture 5/24:  Abundant Cryptococcus neoformans  Quantiferon TB 5/26:  Negative  CSF Culture 5/27:  Cryptococcus neoformans  BAL RML 5/28:  Cryptococcus neoformans / AFB smear negative & culture pending Tracheal Aspirate AFB 5/30 >>> Smear negative / Ctx pending Blood Cultures x2 5/30:  Negative  Stool Culture 5/31:  Negative  Stool Gastrointestinal Panel 5/31:  Negative Stool C diff 5/31:  Negative  CSF Culture 6/1 >> Encapsulated yeast seen   Subjective: Seen this morning. Sleeping. Multiple calls and touch, briefly opened eyes but nonverbal and not following instructions. As per mother at bedside, woke up this morning and asked for something to drink. As per her report, was more alert, smiling and somewhat communicative yesterday.  ROS: Unable due to mental status changes.  Objective:  Vitals:   01/09/17 0025 01/09/17 0404 01/09/17 0500 01/09/17 0820  BP: 123/74 (!) 149/84  133/73  Pulse: 72 74     Resp: (!) 24 11    Temp: 98.7 F (37.1 C) 98.4 F (36.9 C)  98.8 F (37.1 C)  TempSrc: Oral Oral  Axillary  SpO2: 99% 99%    Weight:   61.6 kg (135 lb 12.9 oz)   Height:        Examination:  General exam: Pleasant young male, well built and nourished, lying comfortably supine in bed. Respiratory system: Poor inspiratory effort but seems clear to auscultation without wheezing, rhonchi or crackles. No increased work of breathing. Cor Trak in place. Cardiovascular system: S1 & S2 heard, RRR. No JVD, murmurs, rubs, gallops or clicks. No pedal edema. Telemetry: Sinus rhythm. Gastrointestinal system: Abdomen is nondistended, soft and nontender. No organomegaly or masses felt. Normal bowel sounds heard. Central nervous system: Mental status as stated above. No focal neurological deficits. Extremities: Increased tone/rigid in all extremities, especially upper extremities. No spontaneous limb movements noted during my exam. Unable to follow instructions. Skin: Extensive tattooing of his body. Psychiatry: Judgement and insight impaired. Mood & affect cannot be assessed    Data Reviewed: I have personally reviewed following labs and imaging studies  CBC:  Recent Labs Lab 01/05/17 0233 01/06/17 0332 01/07/17 0257 01/08/17 0206 01/09/17 0328  WBC 2.8* 4.7 5.5 7.6 4.5  NEUTROABS 1.7 3.5 4.1 6.3 3.2  HGB 8.6* 9.4* 9.1* 8.8* 9.0*  HCT 27.1* 29.9* 27.9* 27.1* 27.3*  MCV 86.0 86.2 84.0 84.2 83.7  PLT 255 285 244 209 993   Basic Metabolic Panel:  Recent Labs Lab 01/05/17 0233 01/06/17 0332 01/07/17 0257 01/08/17 0206 01/09/17 0328  NA 130* 129* 129*  131* 130*  130* 132*  K 4.2 4.3 4.0  4.0 3.6  3.6 3.7  CL 101 98*  100*  100* 102  102 104  CO2 22 23 21*  21* _0 GLUCOSE 89 98 90  92 117*  119* 101*  BUN 18 19 22*  20 26*  26* 24*  CREATININE 0.94 1.07 1.12  1.11 1.00  0.95 0.88  CALCIUM 9.3 9.6 9.3  9.3 9.2  9.2 9.0  MG 1.9 1.7 1.7 1.6* 2.1  PHOS 5.6*  5.6* 5.4* 4.8* 4.3   Liver Function Tests:  Recent Labs Lab 01/05/17 0233 01/06/17 0332 01/07/17 0257 01/08/17 0206 01/09/17 0328  ALBUMIN 2.6* 3.0* 3.0* 2.8* 2.8*   Coagulation Profile:  Recent Labs Lab 01/06/17 1654  INR 1.09   CBG:  Recent Labs Lab 01/08/17 1548 01/08/17 2006 01/09/17 0023 01/09/17 0403 01/09/17 0817  GLUCAP 88 91 96 88 106*    Recent Results (from the past 240 hour(s))  CSF culture with Stat gram stain     Status: None   Collection Time: 12/30/16  2:14 PM  Result Value Ref Range Status   Specimen Description CSF  Final   Special Requests Immunocompromised  Final   Gram Stain   Final    WBC PRESENT, PREDOMINANTLY MONONUCLEAR ENCAPSULATED YEAST SEEN CYTOSPIN SMEAR    Culture RARE CRYPTOCOCCUS NEOFORMANS  Final   Report Status 01/05/2017 FINAL  Final  Fungus Culture With Stain     Status: None (Preliminary result)   Collection Time: 12/30/16  2:14 PM  Result Value Ref Range Status   Fungus Stain Final report  Final    Comment: (NOTE) Performed At: [facility] [place], [place] [id number] Lindon Romp MD DX:[id number]    Fungus (Mycology) Culture PENDING  Incomplete   Fungal Source PENDING  Incomplete  Fungus Culture Result     Status: None   Collection Time: 12/30/16  2:14 PM  Result Value Ref Range Status   Result 1 Comment  Final    Comment: (NOTE) Fungal elements, such as arthroconidia, hyphal fragments, chlamydoconidia, observed. Performed At: [facility] [address] [place], [place] [id number] Lindon Romp MD TK:[id number]   CSF culture with Stat gram stain     Status: None   Collection Time: 01/05/17  9:00 AM  Result Value Ref Range Status   Specimen Description CSF  Final   Special Requests Immunocompromised  Final   Gram Stain   Final    WBC PRESENT, PREDOMINANTLY MONONUCLEAR YEAST CRITICAL RESULT CALLED TO, READ BACK BY AND VERIFIED WITH: Barnett Abu, RN AT 1112 ON 01/05/17 BY  C. JESSUP, MLT. CYTOSPIN SMEAR    Culture NO GROWTH 3 DAYS  Final   Report Status 01/09/2017 FINAL  Final         Radiology Studies: Dg Chest Port 1 View  Result Date: 01/08/2017 CLINICAL DATA:  HIV disease with meningitis. EXAM: PORTABLE CHEST 1 VIEW COMPARISON:  January 07, 2017 FINDINGS: Feeding tube tip is below the diaphragm. Contrast is seen in the stomach. There is no edema or consolidation. Heart is slightly enlarged with pulmonary vascularity within normal limits. No adenopathy. No bone lesions. IMPRESSION: Stable cardiac prominence. No edema or consolidation. Feeding tube tip below diaphragm. Electronically Signed   By: Lowella Grip III M.D.   On: 01/08/2017 07:26        Scheduled Meds: . amantadine  50 mg Oral BID WC   Followed by  . [START ON 01/10/2017] amantadine  100 mg Oral BID WC  . azithromycin  1,200 mg Oral Weekly  .  famotidine  20 mg Oral QHS  . feeding supplement (ENSURE ENLIVE)  237 mL Oral BID BM  . feeding supplement (PRO-STAT SUGAR FREE 64)  30 mL Oral BID  . flucytosine  1,500 mg Per Tube Q6H  . heparin subcutaneous  5,000 Units Subcutaneous Q8H  . lidocaine (PF)  5 mL Other Once  . mouth rinse  15 mL Mouth Rinse BID  . sulfamethoxazole-trimethoprim  10 mL Per Tube Daily   Continuous Infusions: . sodium chloride 50 mL/hr at 01/08/17 2020  . amphotericin  B  Liposome (AMBISOME) ADULT IV Stopped (01/08/17 1327)  . feeding supplement (JEVITY 1.2 CAL) 1,000 mL (01/08/17 2124)  . sodium chloride 500 mL (01/09/17 0919)  . sodium chloride Stopped (01/08/17 1427)     LOS: 59 days     Ligia Duguay, MD, FACP, FHM. [facility] Pager [phone]  If 7PM-7AM, please contact night-coverage [email or web address] Password [email or web address] 01/09/2017, 11:10 AM

## 2017-01-10 ENCOUNTER — Inpatient Hospital Stay (HOSPITAL_COMMUNITY): Payer: Medicaid Other

## 2017-01-10 DIAGNOSIS — R338 Other retention of urine: Secondary | ICD-10-CM

## 2017-01-10 DIAGNOSIS — D696 Thrombocytopenia, unspecified: Secondary | ICD-10-CM

## 2017-01-10 DIAGNOSIS — I749 Embolism and thrombosis of unspecified artery: Secondary | ICD-10-CM

## 2017-01-10 DIAGNOSIS — R1310 Dysphagia, unspecified: Secondary | ICD-10-CM

## 2017-01-10 LAB — RENAL FUNCTION PANEL
Albumin: 2.6 g/dL — ABNORMAL LOW (ref 3.5–5.0)
Anion gap: 5 (ref 5–15)
BUN: 23 mg/dL — AB (ref 6–20)
CHLORIDE: 103 mmol/L (ref 101–111)
CO2: 23 mmol/L (ref 22–32)
Calcium: 9 mg/dL (ref 8.9–10.3)
Creatinine, Ser: 0.82 mg/dL (ref 0.61–1.24)
GFR calc Af Amer: >60 mL/min (ref >60)
Glucose, Bld: 104 mg/dL — ABNORMAL HIGH (ref 65–99)
POTASSIUM: 3.6 mmol/L (ref 3.5–5.1)
Phosphorus: 4.4 mg/dL (ref 2.5–4.6)
Sodium: 131 mmol/L — ABNORMAL LOW (ref 135–145)

## 2017-01-10 LAB — GLUCOSE, CAPILLARY
GLUCOSE-CAPILLARY: 107 mg/dL — AB (ref 65–99)
GLUCOSE-CAPILLARY: 114 mg/dL — AB (ref 65–99)
GLUCOSE-CAPILLARY: 86 mg/dL (ref 65–99)
Glucose-Capillary: 103 mg/dL — ABNORMAL HIGH (ref 65–99)
Glucose-Capillary: 103 mg/dL — ABNORMAL HIGH (ref 65–99)
Glucose-Capillary: 118 mg/dL — ABNORMAL HIGH (ref 65–99)

## 2017-01-10 LAB — MAGNESIUM: MAGNESIUM: 1.4 mg/dL — AB (ref 1.7–2.4)

## 2017-01-10 MED ORDER — POTASSIUM CHLORIDE 20 MEQ/15ML (10%) PO SOLN
60.0000 meq | Freq: Once | ORAL | Status: AC
  Administered 2017-01-10: 60 meq via ORAL
  Filled 2017-01-10: qty 45

## 2017-01-10 MED ORDER — MAGNESIUM OXIDE 400 (241.3 MG) MG PO TABS
400.0000 mg | ORAL_TABLET | Freq: Two times a day (BID) | ORAL | Status: DC
  Administered 2017-01-10 – 2017-02-06 (×51): 400 mg via ORAL
  Filled 2017-01-10 (×55): qty 1

## 2017-01-10 MED ORDER — MAGNESIUM SULFATE 4 GM/100ML IV SOLN
4.0000 g | Freq: Once | INTRAVENOUS | Status: AC
  Administered 2017-01-10: 4 g via INTRAVENOUS
  Filled 2017-01-10: qty 100

## 2017-01-10 MED ORDER — RESOURCE THICKENUP CLEAR PO POWD
ORAL | Status: DC | PRN
  Filled 2017-01-10 (×2): qty 125

## 2017-01-10 MED ORDER — MAGNESIUM OXIDE 400 (241.3 MG) MG PO TABS: 400.0000 mg | ORAL_TABLET | Freq: Two times a day (BID) | ORAL | Status: DC

## 2017-01-10 NOTE — Progress Notes (Signed)
PROGRESS NOTE   William Pena  XBJ:[id number]    DOB: [birth year]    DOA: 12/20/2016  PCP: [name], MD   I have briefly reviewed patients previous medical records in [facility].  Brief Narrative:  31 year old male with a PMH of HIV/AIDS (last CD4 on 12/21/16:21), just started back on ART 2 months PTA, however he was incarcerated and reported that he was not getting his medications, admitted on 5/22 for perirectal abscess. During admission he was also noted to have headache, fever, chills, night sweats, neck pain and lymphadenopathy. ID was consulted and recommended imaging of chest, LP and cultures be sent from perirectal I&D. Also his serum cryptococcal antigen titer was elevated raising the possibility of disseminated cryptococcal infection including lung involvement. Pulmonology was consulted by ID for bronchoscopy and washings in an effort to help determine if his CT changes reflect TB or possible pulmonary cryptococcal disease. Initially unable to perform bronchoscopy due to hypoxia. 5/28 patient had altered LOC, became very lethargic/obtunded requiring CT and LP, complicated by acute respiratory failure due to altered mental status that required mechanical ventilation. He had a lumbar CSF drain to depression arise. Now extubated, drain removed. Failed swallowing evaluation and has Cor trak forte tube feeds. Mental status slightly better but waxing and waning. ID, neurology, neurosurgery and CCM consulted during hospital course. Care transferred to stepdown and [place] on 01/09/17. MS slowly improving.  Assessment & Plan:   Principal Problem:   Perirectal abscess Active Problems:   Chronic pain   AIDS (acquired immune deficiency syndrome) (HCC)   Hyponatremia   Normocytic anemia   Elevated LFTs   Pulmonary infiltrates   Hypomagnesemia   Encephalopathy acute   SIRS (systemic inflammatory response syndrome) (HCC)   Acute respiratory failure (HCC)   Rectal abscess   Cryptococcal  meningoencephalitis (HCC)   Cryptococcosis (HCC)   Cavitary pneumonia   Diffuse lymphadenopathy   Drug rash   Elevated intracranial pressure   HIV disease (HCC)   Lymphadenopathy of head and neck   HIV (human immunodeficiency virus infection) ([place])   Meningitis   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 D21Rx. ID following. Lumbar drain removed 6/10. I discussed with Dr. [name] on 6/11. 2. Acute hypoxic respiratory failure secondary to cryptococcal pneumonia: Extubated 6/5. Breathing spontaneously and stable without hypoxia on room air. Patient unable to do IS or flutter valve due to mental status changes. Intermittent chest x-ray as needed. Chest x-ray 6/10: No edema or consolidation. 3. Acute encephalopathy: Multifactorial >cryptococcal meningitis, acute strokes, metabolic derangements, medication effect and respiratory failure. Neurology follow-up 6/10 appreciated >overall improved but now has clear abulia and cognitive dysfunction consistent with his left basal ganglia infarct. Supportive care, optimize metabolic status, treat infections as above, minimize sedation and avoid precipitating medications. Amantadine initiated by neurology and hopefully will help his abulia. MS clearly but slowly improving. Per Neuro f/u 6/11> suspect MS will plateau at a level of cognition and motor function that will be significantly lower than his preadmission baseline. 4. Acute ischemic stroke: Due to small vessel involvement from his cryptococcal infection. As per neurology, continue with antifungal treatment and no role for antiplatelet agents or statins in this setting. Stable without change. 5. HIV/AIDS: CD4 count this admission 21 and high viral load >1 million. Infectious disease following. Continue prophylaxis with Bactrim and azithromycin. ART not initiated due  to see him infection. Discussed  with ID 6/11. 6. Perirectal abscess: Status post I&D. Twice a day dressing changes. WOC following. 7. Genital herpes: Completed acyclovir 6 days. 8. Acute kidney injury: Resolved. 9. Hypomagnesemia: Replace & follow. 10. Hyponatremia/? SIADH: Stable. 11. Diarrhea: Stool culture negative.? Resolved. 12. Anemia of chronic disease: Stable.   DVT prophylaxis: Subcutaneous heparin Code Status: Full Family Communication: Discussed with mother at bedside. Updated care and answered questions. Disposition: Continue treatment in the stepdown unit. DC to SNF when improved> Rehab did not feel appropriate for CIR.   Consultants:  CCM-signed off Infectious disease Neurology Neurosurgery   Procedures:  OETT 8.0 5/28 - 6/5 OGT 5/28 - 6/5 Lumbar Drain 5/28 >>DC'ed 6/10 Foley 5/28 >> PIV  Cor trak>  Antimicrobials:  Triumeq 5/23 - 5/24 Flagyl 5/23 - 5/24 Vancomycin 5/23 - 5/24 Doxycycline 5/24 - 5/25 Rocephin 5/23 - 5/30 Augmentin 5/24 - 5/25 Bactrim 5/23 >> Flucytosine 5/24 >> Valtrex 5/24 >> Azithromycin qweek 5/24 >> Ambisome 5/24 >>   MICROBIOLOGY: MRSA PCR 5/23:  Negative  Blood Cultures x2 5/22:  2/2 Bottles Positive Cryptococcus neoformans  Blood Cultures x2 5/23:  2/2 Bottles Positive Cryptococcus neoformans  Wound Culture 5/23:  Multiple organisms present CSF Culture 5/24:  Abundant Cryptococcus neoformans  Quantiferon TB 5/26:  Negative  CSF Culture 5/27:  Cryptococcus neoformans  BAL RML 5/28:  Cryptococcus neoformans / AFB smear negative & culture pending Tracheal Aspirate AFB 5/30 >>> Smear negative / Ctx pending Blood Cultures x2 5/30:  Negative  Stool Culture 5/31:  Negative  Stool Gastrointestinal Panel 5/31:  Negative Stool C diff 5/31:  Negative  CSF Culture 6/1 >> Encapsulated yeast seen   Subjective: Seen this morning. Sleeping. As per Mom, worked with PT and Yell yesterday, was awake most of the afternoon yesterday, more  communicative: "mommy, I'm going to get up and walk to the cafeteria", mostly yes & no to questions, asked for pudding, asked for his aunt and said "I love you" on the phone.  ROS: Unable due to mental status changes.  Objective:  Vitals:   01/09/17 2340 01/10/17 0328 01/10/17 0341 01/10/17 0434  BP: 132/67  140/85 140/83  Pulse: 76  84 76  Resp: _0 Temp: 98.8 F (37.1 C)   97.6 F (36.4 C)  TempSrc: Oral   Axillary  SpO2: 100%  100% 99%  Weight:  63.3 kg (139 lb 8.8 oz)    Height:        Examination:  General exam: Pleasant young male, well built and nourished, lying comfortably supine in bed. No change. Respiratory system: Poor inspiratory effort but seems clear to auscultation without wheezing, rhonchi or crackles. No increased work of breathing. Cor Trak in place. No change/stable. Cardiovascular system: S1 & S2 heard, RRR. No JVD, murmurs, rubs, gallops or clicks. No pedal edema. Telemetry: Sinus rhythm. Gastrointestinal system: Abdomen is nondistended, soft and nontender. No organomegaly or masses felt. Normal bowel sounds heard. Stable. Foley+ Central nervous system: Mental status as stated above. No focal neurological deficits.  Extremities: Increased tone/rigid in all extremities, especially upper extremities. No spontaneous limb movements noted during my exam. Unable to follow instructions. UE's less rigid compared to yesterday Skin: Extensive tattooing of his body. Psychiatry: Judgement and insight impaired. Mood & affect cannot be assessed    Data Reviewed: I have personally reviewed following labs and imaging studies  CBC:  Recent Labs Lab 01/05/17 0233 01/06/17 0332 01/07/17 0257 01/08/17 0206 01/09/17 0328  WBC 2.8* 4.7 5.5 7.6  4.5  NEUTROABS 1.7 3.5 4.1 6.3 3.2  HGB 8.6* 9.4* 9.1* 8.8* 9.0*  HCT 27.1* 29.9* 27.9* 27.1* 27.3*  MCV 86.0 86.2 84.0 84.2 83.7  PLT 255 285 244 209 503   Basic Metabolic Panel:  Recent Labs Lab 01/06/17 0332  01/07/17 0257 01/08/17 0206 01/09/17 0328 01/10/17 0443  NA 129* 129*  131* 130*  130* 132* 131*  K 4.3 4.0  4.0 3.6  3.6 3.7 3.6  CL 98* 100*  100* 102  102 104 103  CO2 23 21*  21* _0 GLUCOSE 98 90  92 117*  119* 101* 104*  BUN 19 22*  20 26*  26* 24* 23*  CREATININE 1.07 1.12  1.11 1.00  0.95 0.88 0.82  CALCIUM 9.6 9.3  9.3 9.2  9.2 9.0 9.0  MG 1.7 1.7 1.6* 2.1 1.4*  PHOS 5.6* 5.4* 4.8* 4.3 4.4   Liver Function Tests:  Recent Labs Lab 01/06/17 0332 01/07/17 0257 01/08/17 0206 01/09/17 0328 01/10/17 0443  ALBUMIN 3.0* 3.0* 2.8* 2.8* 2.6*   Coagulation Profile:  Recent Labs Lab 01/06/17 1654  INR 1.09   CBG:  Recent Labs Lab 01/09/17 1157 01/09/17 1612 01/09/17 2127 01/09/17 2348 01/10/17 0436  GLUCAP 99 98 106* 86 103*    Recent Results (from the past 240 hour(s))  CSF culture with Stat gram stain     Status: None   Collection Time: 01/05/17  9:00 AM  Result Value Ref Range Status   Specimen Description CSF  Final   Special Requests Immunocompromised  Final   Gram Stain   Final    WBC PRESENT, PREDOMINANTLY MONONUCLEAR YEAST CRITICAL RESULT CALLED TO, READ BACK BY AND VERIFIED WITH: Barnett Abu, RN AT 1112 ON 01/05/17 BY C. JESSUP, MLT. CYTOSPIN SMEAR    Culture NO GROWTH 3 DAYS  Final   Report Status 01/09/2017 FINAL  Final         Radiology Studies: No results found.      Scheduled Meds: . amantadine  100 mg Oral BID WC  . azithromycin  1,200 mg Oral Weekly  . famotidine  20 mg Per Tube QHS  . feeding supplement (PRO-STAT SUGAR FREE 64)  30 mL Per Tube BID  . flucytosine  1,500 mg Per Tube Q6H  . heparin subcutaneous  5,000 Units Subcutaneous Q8H  . mouth rinse  15 mL Mouth Rinse BID  . mouth rinse  15 mL Mouth Rinse BID  . sodium chloride flush  3 mL Intravenous Q12H  . sulfamethoxazole-trimethoprim  10 mL Per Tube Daily   Continuous Infusions: . sodium chloride 50 mL/hr at 01/10/17 0723  . amphotericin   B  Liposome (AMBISOME) ADULT IV Stopped (01/09/17 1443)  . feeding supplement (JEVITY 1.2 CAL) 1,000 mL (01/09/17 1826)  . sodium chloride Stopped (01/09/17 1019)  . sodium chloride Stopped (01/09/17 1311)     LOS: 49 days     Kabrina Christiano, MD, FACP, FHM. [facility] Pager [phone]  If 7PM-7AM, please contact night-coverage [email or web address] Password [facility] 01/10/2017, 8:05 AM

## 2017-01-10 NOTE — Progress Notes (Signed)
Pharmacy Consult:  Electrolyte Replacement  60 yom with advanced HIV disease currently on treatment for disseminated cryptococcal disease with bacteremia and meningitis. Pharmacy consulted to manage electrolytes. SCr back down to 0.88. Continues on saline boluses before and after ambisome dose  K 3.6 << 3.7 s/p 40 mEq KCl on 6/11 Mg down to 1.4 << 2.1  Plan: - KCl 60 mEq po x 1 today - Magnesium 4g IV x 1 dose ordered by MD - Will start Magnesium oxide 400 mg po bid - Monitor K and Mag closely and order additional replacement prn  Thank you for allowing pharmacy to be a part of this patient's care.  Alycia Rossetti, PharmD, BCPS Clinical Pharmacist Pager: [phone] Clinical phone for 01/10/2017 from 7a-3:30p: [phone] If after 3:30p, please call main pharmacy at: [phone] 01/10/2017 10:50 AM

## 2017-01-10 NOTE — Progress Notes (Signed)
    [facility] for Infectious Disease   Reason for visit: Follow up on Cryptococcal meningitis  Interval History: Had swallow eval today, dysphagia diet recommended; afebrile, CSF culture from 6/7 has remained negative.  Patient otherwise non verbal.  Mother at bedside  Amphotericin/flucytosine day 20  Physical Exam: Constitutional:  Vitals:   01/10/17 0800 01/10/17 1148  BP: 132/79 (!) 142/84  Pulse: 82 100  Resp: 12 14  Temp: 99.2 F (37.3 C) 99.1 F (37.3 C)   patient appears in NAD Eyes: anicteric Respiratory: Normal respiratory effort; CTA B Cardiovascular: RRR GI: soft, nt, nd Neuro: no response  Review of Systems: Unable to be assessed due to mental status  Lab Results  Component Value Date   WBC 4.5 01/09/2017   HGB 9.0 (L) 01/09/2017   HCT 27.3 (L) 01/09/2017   MCV 83.7 01/09/2017   PLT 179 01/09/2017    Lab Results  Component Value Date   CREATININE 0.82 01/10/2017   BUN 23 (H) 01/10/2017   NA 131 (L) 01/10/2017   K 3.6 01/10/2017   CL 103 01/10/2017   CO2 23 01/10/2017    Lab Results  Component Value Date   ALT 42 12/28/2016   AST 30 12/28/2016   ALKPHOS 135 (H) 12/28/2016     Microbiology: Recent Results (from the past 240 hour(s))  CSF culture with Stat gram stain     Status: None   Collection Time: 01/05/17  9:00 AM  Result Value Ref Range Status   Specimen Description CSF  Final   Special Requests Immunocompromised  Final   Gram Stain   Final    WBC PRESENT, PREDOMINANTLY MONONUCLEAR YEAST CRITICAL RESULT CALLED TO, READ BACK BY AND VERIFIED WITH: Barnett Abu, RN AT 1112 ON 01/05/17 BY C. JESSUP, MLT. CYTOSPIN SMEAR    Culture NO GROWTH 3 DAYS  Final   Report Status 01/09/2017 FINAL  Final    Impression/Plan:  1. Cryptococcal meningitis - repeat culture has remained negative.  If it continues to remain negative, I will consider stopping induction therapy next week and transition to fluconazole therapy.  We will need to continue to  monitor him here until amphotericin and flucytosine.    2. Medication monitoring - creat has remained good, no leukopenia.  Continuing with magnesium as needed.   3. Opportunistic infection risk - continue with azithromycin and bactrim  4.  HIV - holding on ARVs due to #1.

## 2017-01-10 NOTE — Progress Notes (Signed)
Modified Barium Swallow Progress Note  Patient Details  Name: William Pena MRN: [id number] Date of Birth: 06-12-1986  Today's Date: 01/10/2017  Modified Barium Swallow completed.  Full report located under Chart Review in the Imaging Section.  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 Inconsistent swallow trigger noted, with occasional penetration above the cords before and during the swallow. This penetrate, combined with mild to trace vallecular and lateral channeal residuals do result in trace coating of the traceheal wall post swallow (mixed with saliva) without sensation. The pt is able to complete multiple swallows to clear most residue spontaneously, but a cued intermittent throat clear is also beneficial to clear airway. Overall the pt is likely to experience trace aspiration events, but anticipate improvment in function with use of swallow mechanism and traning for compensatory strategies. Recommend pt also incorporate respiratory muscle strength training to improve pharyngeal musculature and force of expectoration. Will initiate a dys 1 (puree) diet with honey thick liquids with potential for upgrade, particularly of solids as pt demosntrates tolerance and consistency with strategies.    Swallow Evaluation Recommendations       SLP Diet Recommendations: Dysphagia 1 (Puree) solids;Honey thick liquids   Liquid Administration via: Cup;Spoon   Medication Administration: Crushed with puree       Compensations: Minimize environmental distractions;Slow rate;Small sips/bites;Multiple dry swallows after each bite/sip;Clear throat intermittently;Effortful swallow       Oral Care Recommendations: Oral care BID       [name], MA CCC-SLP [phone]  [name] 01/10/2017,11:21 AM

## 2017-01-10 NOTE — Progress Notes (Signed)
This note also relates to the following rows which could not be included: Pulse Rate - Cannot attach notes to unvalidated device data ECG Heart Rate - Cannot attach notes to unvalidated device data Resp - Cannot attach notes to unvalidated device data SpO2 - Cannot attach notes to unvalidated device data    Deleted note posted on incorrect patient

## 2017-01-11 LAB — GLUCOSE, CAPILLARY
GLUCOSE-CAPILLARY: 129 mg/dL — AB (ref 65–99)
GLUCOSE-CAPILLARY: 100 mg/dL — AB (ref 65–99)
GLUCOSE-CAPILLARY: 115 mg/dL — AB (ref 65–99)
Glucose-Capillary: 98 mg/dL (ref 65–99)
Glucose-Capillary: 103 mg/dL — ABNORMAL HIGH (ref 65–99)

## 2017-01-11 LAB — RENAL FUNCTION PANEL
ALBUMIN: 2.7 g/dL — AB (ref 3.5–5.0)
ANION GAP: 8 (ref 5–15)
BUN: 23 mg/dL — AB (ref 6–20)
CO2: 20 mmol/L — ABNORMAL LOW (ref 22–32)
Calcium: 9.1 mg/dL (ref 8.9–10.3)
Chloride: 102 mmol/L (ref 101–111)
Creatinine, Ser: 1.06 mg/dL (ref 0.61–1.24)
Glucose, Bld: 96 mg/dL (ref 65–99)
PHOSPHORUS: 6 mg/dL — AB (ref 2.5–4.6)
POTASSIUM: 3.7 mmol/L (ref 3.5–5.1)
Sodium: 130 mmol/L — ABNORMAL LOW (ref 135–145)

## 2017-01-11 LAB — MAGNESIUM: MAGNESIUM: 1.9 mg/dL (ref 1.7–2.4)

## 2017-01-11 MED ORDER — SODIUM CHLORIDE 0.9 % IV BOLUS (SEPSIS)
500.0000 mL | Freq: Once | INTRAVENOUS | Status: AC
  Administered 2017-01-11: 500 mL via INTRAVENOUS

## 2017-01-11 MED ORDER — FAMOTIDINE 20 MG PO TABS
20.0000 mg | ORAL_TABLET | Freq: Every day | ORAL | Status: DC
  Administered 2017-01-11 – 2017-01-13 (×3): 20 mg via ORAL
  Filled 2017-01-11 (×3): qty 1

## 2017-01-11 MED ORDER — MAGNESIUM SULFATE 2 GM/50ML IV SOLN
2.0000 g | Freq: Once | INTRAVENOUS | Status: AC
  Administered 2017-01-11: 2 g via INTRAVENOUS
  Filled 2017-01-11: qty 50

## 2017-01-11 MED ORDER — PRO-STAT SUGAR FREE PO LIQD
30.0000 mL | Freq: Two times a day (BID) | ORAL | Status: DC
  Administered 2017-01-11 – 2017-01-12 (×2): 30 mL via ORAL
  Filled 2017-01-11 (×2): qty 30

## 2017-01-11 MED ORDER — POLYETHYLENE GLYCOL 3350 17 G PO PACK
17.0000 g | PACK | Freq: Every day | ORAL | Status: DC | PRN
  Administered 2017-01-14 – 2017-01-26 (×4): 17 g via ORAL
  Filled 2017-01-11 (×5): qty 1

## 2017-01-11 MED ORDER — ACETAMINOPHEN 160 MG/5ML PO SOLN
650.0000 mg | Freq: Four times a day (QID) | ORAL | Status: DC | PRN
  Administered 2017-01-13 – 2017-01-17 (×4): 650 mg via ORAL
  Filled 2017-01-11 (×6): qty 20.3

## 2017-01-11 MED ORDER — POTASSIUM CHLORIDE 20 MEQ/15ML (10%) PO SOLN
60.0000 meq | Freq: Once | ORAL | Status: AC
  Administered 2017-01-11: 60 meq via ORAL
  Filled 2017-01-11: qty 45

## 2017-01-11 NOTE — Progress Notes (Signed)
Pharmacy Consult:  Electrolyte Replacement  85 yom with advanced HIV disease currently on treatment for disseminated cryptococcal disease with bacteremia and meningitis. Pharmacy consulted to manage electrolytes. SCr back down to 0.88. Continues on saline boluses before and after ambisome dose  SCr up a little to 1.06, CrCl ~39ml/min. Lytes wnl, K stable at 3.7. Mg up to 1.9 after replacement.   Plan: Give Mg 2g IV x 1 and potassium 49mEq PO x 1 Continue Mg 400mg  PO BID Consider adding daily potassium if K remains stay stable with frequent replacement?  Thank you for allowing pharmacy to be a part of this patient's care.  Elenor Quinones, PharmD, BCPS Clinical Pharmacist Pager [phone] 01/11/2017 9:41 AM

## 2017-01-11 NOTE — Progress Notes (Signed)
[facility] for Infectious Disease    Date of Admission:  12/20/2016   Total days of antibiotics 21        L-Ampho + Flucytosine        OI proph (Bactrim / Azith)          ID: William Pena is a 31 y.o. male with disseminated cryptococal disease (meningitis, pneumonia, cerebral infarction).   Principal Problem:   Perirectal abscess Active Problems:   Chronic pain   AIDS (acquired immune deficiency syndrome) (HCC)   Hyponatremia   Normocytic anemia   Elevated LFTs   Pulmonary infiltrates   Hypomagnesemia   Encephalopathy acute   SIRS (systemic inflammatory response syndrome) (HCC)   Acute respiratory failure (HCC)   Rectal abscess   Cryptococcal meningoencephalitis (HCC)   Cryptococcosis (HCC)   Cavitary pneumonia   Diffuse lymphadenopathy   Drug rash   Elevated intracranial pressure   HIV disease (HCC)   Lymphadenopathy of head and neck   HIV (human immunodeficiency virus infection) ([place])   Meningitis   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 Swallow study passed. Enjoying PO nutrition. No family at the bedside today.   Medications:  . amantadine  100 mg Oral BID WC  . azithromycin  1,200 mg Oral Weekly  . famotidine  20 mg Oral QHS  . feeding supplement (PRO-STAT SUGAR FREE 64)  30 mL Oral BID  . flucytosine  1,500 mg Per Tube Q6H  . heparin subcutaneous  5,000 Units Subcutaneous Q8H  . magnesium oxide  400 mg Oral BID  . mouth rinse  15 mL Mouth Rinse BID  . mouth rinse  15 mL Mouth Rinse BID  . sodium chloride flush  3 mL Intravenous Q12H  . sulfamethoxazole-trimethoprim  10 mL Per Tube Daily    Objective: Vital signs in last 24 hours: Temp:  [98.1 F (36.7 C)-99 F (37.2 C)] 98.9 F (37.2 C) (06/13 0905) Pulse Rate:  [92] 92 (06/13 0424) Resp:  [12-18] 18 (06/13 0424) BP: (145-167)/(89-109) 167/109 (06/13 1554) SpO2:  [93 %-98 %] 98 % (06/13 0424) Weight:  [132 lb 4.4 oz (60  kg)-136 lb 11 oz (62 kg)] 136 lb 11 oz (62 kg) (06/13 0905)   General appearance: alert and in no distress. Sitting in bed quietly. Eyes: conjunctivae/corneas clear. PERRL, EOM's intact.  Neck: no carotid bruit, no JVD, supple, symmetrical, trachea midline and improved lymphadenopathy. Resp: Clear to auscultation Cardio: regular rate and rhythm, S1, S2 normal, no murmur, click, rub or gallop Extremities: extremities normal, atraumatic, no cyanosis or edema Skin: color, texture, turgor normal. No rashes or lesions Neurologic: Alert. Slowed mental processes. Not as verbal today.   Lab Results  Recent Labs  01/09/17 0328 01/10/17 0443 01/11/17 0343  WBC 4.5  --   --   HGB 9.0*  --   --   HCT 27.3*  --   --   NA 132* 131* 130*  K 3.7 3.6 3.7  CL 104 103 102  CO2 22 23 20*  BUN 24* 23* 23*  CREATININE 0.88 0.82 1.06   Liver Panel  Recent Labs  01/10/17 0443 01/11/17 0343  ALBUMIN 2.6* 2.7*   Microbiology: 01/05/17:  -WBC 18, 95% lymph -RBC 23 -glucose 40  -protein 96 -culture >> rare yeast on GS, no growth final report  Studies/Results: No results found.   Assessment/Plan: CM c/b small vessel infarcts=  - Day 21of  L-ampho and flucytosine - extended induction with continually +CSF cultures - CSF 6/7 > Yeast on GS but NG on day 3 of culture, final report - Creatinine up a little 0.82 > 1.06. Will trend another day as we may need to increase to 1L Pre/Post L-ampho infusion. Would like to keep him on induction preferably through next week since first sterile CSF just obtained then transition to prolonged PO fluconazole   HIV Disease= - tx deferred due to CM treatment - OI proph -continue with bactrim ss daily + Azith weekly   Perirectal abscess=  - received a short course of abtx after debridement - wound and follow up care per surgical team   Pneumonia, Cryptococcal=  - 5/28 BAL Cx >>Cryptococcal Neoformans confirmed  - mtB PCR and AFB negative -  improving with treatment    [name], MSN, NP-C [facility] for Infectious [place] Cell: [phone] Pager: [phone]  01/11/2017, 3:55 PM

## 2017-01-11 NOTE — Progress Notes (Signed)
Physical Therapy Treatment Patient Details Name: William Pena MRN: [id number] DOB: Jan 21, 1986 Today's Date: 01/11/2017    History of Present Illness Patient is a 31 y/o male presents with sepsis secondary to perirectal abscess s/p I&D 5/23 and disseminated cryptococcal infection including meningitis, bacteremia, and likely pneumonia. Intubated 5/28-6/5. Pt found to have acute ischemic strokes. 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 Second MRI- extension of the diffusion restriction in the left basal ganglia and right basal ganglia. s/p Lumbar drain 5/30. PMH includes advanced hiv disease    PT Comments    Patient seen for ROM, therapeutic activity, and OOB to chair assist. Patient tolerated well, positioned on pillows in chair for upright positioning in preparation for lunch. Mom present at bedside. Will plan for return visit to assist with return to bed and further functional task performance as tolerance.   Follow Up Recommendations  SNF     Equipment Recommendations  Other (comment) (TBA in next venue)    Recommendations for Other Services       Precautions / Restrictions Precautions Precautions: Fall Required Braces or Orthoses: Other Brace/Splint (bil. elbow splints ) Restrictions Weight Bearing Restrictions: No    Mobility  Bed Mobility               General bed mobility comments: use of draw sheet to elevate to sitting position in recliner  Transfers Overall transfer level: Needs assistance   Transfers: Lateral/Scoot Transfers          Lateral/Scoot Transfers: Total assist;+2 physical assistance General transfer comment: Lateral draw sheet transfer to drop arm recliner  Ambulation/Gait                 Stairs            Wheelchair Mobility    Modified Rankin (Stroke Patients Only) Modified Rankin (Stroke Patients Only) Pre-Morbid Rankin Score: No symptoms Modified Rankin:  Severe disability     Balance                                            Cognition Arousal/Alertness: Awake/alert Behavior During Therapy: Flat affect Overall Cognitive Status: Impaired/Different from baseline Area of Impairment: Attention;Following commands;Problem solving                 Orientation Level: Disoriented to;Place;Time;Situation Current Attention Level: Focused   Following Commands: Follows one step commands inconsistently     Problem Solving: Slow processing;Decreased initiation;Requires verbal cues;Requires tactile cues General Comments: Patient minimally responsive to activities and commands during session      Exercises Other Exercises Other Exercises: PROM BLEs (knee flexion/ext, hip flex/ext, dorsiflex/plantarflex    General Comments General comments (skin integrity, edema, etc.): VSS throughout session      Pertinent Vitals/Pain Pain Assessment: Faces Faces Pain Scale: Hurts little more Pain Location: grimacing during PROM Pain Descriptors / Indicators: Grimacing Pain Intervention(s): Repositioned    Home Living                      Prior Function            PT Goals (current goals can now be found in the care plan section) Acute Rehab PT Goals Patient Stated Goal: mother presetn at bedside Progress towards PT goals: Not progressing toward goals - comment    Frequency    Min 3X/week  PT Plan Current plan remains appropriate    Co-evaluation              AM-PAC PT "6 Clicks" Daily Activity  Outcome Measure  Difficulty turning over in bed (including adjusting bedclothes, sheets and blankets)?: Total Difficulty moving from lying on back to sitting on the side of the bed? : Total Difficulty sitting down on and standing up from a chair with arms (e.g., wheelchair, bedside commode, etc,.)?: Total Help needed moving to and from a bed to chair (including a wheelchair)?: Total Help needed  walking in hospital room?: Total Help needed climbing 3-5 steps with a railing? : Total 6 Click Score: 6    End of Session   Activity Tolerance: Patient tolerated treatment well Patient left: in chair;with call bell/phone within reach;with family/visitor present Nurse Communication: Mobility status PT Visit Diagnosis: Muscle weakness (generalized) (M62.81);Other symptoms and signs involving the nervous system (R29.898);Hemiplegia and hemiparesis Hemiplegia - Right/Left: Left Hemiplegia - dominant/non-dominant: Non-dominant Hemiplegia - caused by: Cerebral infarction     Time: 3491-7915 PT Time Calculation (min) (ACUTE ONLY): 21 min  Charges:  $Therapeutic Activity: 8-22 mins                    G Codes:       Alben Deeds, PT DPT  [phone]    Duncan Dull 01/11/2017, 12:18 PM

## 2017-01-11 NOTE — Progress Notes (Signed)
  Speech Language Pathology Treatment: Dysphagia;Cognitive-Linquistic  Patient Details Name: William Pena MRN: [id number] DOB: 01-27-1986 Today's Date: 01/11/2017 Time: 3794-3276 SLP Time Calculation (min) (ACUTE ONLY): 25 min  Assessment / Plan / Recommendation Clinical Impression  Pt demonstrates tenuous tolerance of diet; intake has been poor, delayed coughing observed with recommended textures. Pt still following commands to clear throat, but effort is weak. Attempted EMST, blue respironics device, pt able to complete x2 set at 5 and 10 cm H20. Pt could not continually initiate/plan motor movement for exercise. Also attempted alternative communication strategies for accurate Y/N, pt could not use eye gaze to look at words with max cueing. Overall pt is less responsive today and appears more cognitive impaired than in the past two days. Will f/u tomorrow to observed with meal, but may need to consider resuming short term alternate means of nutrition if intake and strength does not improve.    HPI HPI: Patient is a 31 y/o male presents with sepsis secondary to perirectal abscess s/p I&D 5/23 and disseminated cryptococcal infection including meningitis, bacteremia, and likely pneumonia. Intubated 5/28-6/5. 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 Second MRI- extension of the diffusion restriction in the left basal ganglia and right basal ganglia. s/p Lumbar drain 5/30. PMH includes advanced hiv disease      SLP Plan  Continue with current plan of care       Recommendations  Diet recommendations: Dysphagia 1 (puree);Honey-thick liquid Liquids provided via: Cup;Teaspoon Medication Administration: Via alternative means Supervision: Staff to assist with self feeding;Full supervision/cueing for compensatory strategies Compensations: Minimize environmental distractions;Slow rate;Small sips/bites;Multiple dry swallows after each  bite/sip;Clear throat intermittently;Effortful swallow Postural Changes and/or Swallow Maneuvers: Seated upright 90 degrees;Upright 30-60 min after meal                General recommendations: Rehab consult Oral Care Recommendations: Oral care QID Follow up Recommendations: Inpatient Rehab SLP Visit Diagnosis: Dysphagia, oropharyngeal phase (R13.12) Plan: Continue with current plan of care       GO               [facility], [place] CCC-SLP [phone]  [name] 01/11/2017, 1:46 PM

## 2017-01-11 NOTE — Progress Notes (Signed)
Provided hand off report to [facility] on 5W to bed 22. Patient was transported without complications with the following belongings: Ancoban dose to be given at 1800. Also patient clothing and black bag was sent with mother, who accompanied patient to the unit.

## 2017-01-11 NOTE — Progress Notes (Signed)
PROGRESS NOTE   William Pena  ZDG:[id number]    DOB: 03-Jan-1986    DOA: 12/20/2016  PCP: [name], MD   I have briefly reviewed patients previous medical records in [facility].  Brief Narrative:  31 year old male with a PMH of HIV/AIDS (last CD4 on 12/21/16:21), just started back on ART 2 months PTA, however he was incarcerated and reported that he was not getting his medications, admitted on 5/22 for perirectal abscess. During admission he was also noted to have headache, fever, chills, night sweats, neck pain and lymphadenopathy. ID was consulted and recommended imaging of chest, LP and cultures be sent from perirectal I&D. Also his serum cryptococcal antigen titer was elevated raising the possibility of disseminated cryptococcal infection including lung involvement. Pulmonology was consulted by ID for bronchoscopy and washings in an effort to help determine if his CT changes reflect TB or possible pulmonary cryptococcal disease. Initially unable to perform bronchoscopy due to hypoxia. 5/28 patient had altered LOC, became very lethargic/obtunded requiring CT and LP, complicated by acute respiratory failure due to altered mental status that required mechanical ventilation. He had a lumbar CSF drain to depression arise. Now extubated, drain removed. Failed swallowing evaluation and has Cor trak forte tube feeds. Mental status slightly better but waxing and waning. ID, neurology, neurosurgery and CCM consulted during hospital course. Care transferred to stepdown and [place] on 01/09/17. MS slowly improving. Cor Trak discontinued 6/12 and diet initiated. Transfer to medical bed 6/13.  Assessment & Plan:   Principal Problem:   Perirectal abscess Active Problems:   Chronic pain   AIDS (acquired immune deficiency syndrome) (HCC)   Hyponatremia   Normocytic anemia   Elevated LFTs   Pulmonary infiltrates   Hypomagnesemia   Encephalopathy acute   SIRS (systemic inflammatory response syndrome)  (HCC)   Acute respiratory failure (HCC)   Rectal abscess   Cryptococcal meningoencephalitis (HCC)   Cryptococcosis (HCC)   Cavitary pneumonia   Diffuse lymphadenopathy   Drug rash   Elevated intracranial pressure   HIV disease (HCC)   Lymphadenopathy of head and neck   HIV (human immunodeficiency virus infection) ([place])   Meningitis   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 D22Rx. ID following. Lumbar drain removed 6/10. I discussed with Dr. [name] on 6/13: Repeat CSF culture negative, if continues to remain negative then consider stopping induction therapy next week and transition to fluconazole therapy but will need to be in the hospital while on amphotericin and flucytosine to closely monitor renal functions and electrolytes. He was okay with transferring to medical bed. 2. Acute hypoxic respiratory failure secondary to cryptococcal pneumonia: Extubated 6/5. Breathing spontaneously and stable without hypoxia on room air. Patient unable to do IS or flutter valve due to mental status changes. Intermittent chest x-ray as needed. Chest x-ray 6/10: No edema or consolidation. 3. Acute encephalopathy: Multifactorial >cryptococcal meningitis, acute strokes, metabolic derangements, medication effect and respiratory failure. Neurology follow-up 6/10 appreciated >overall improved but now has clear abulia and cognitive dysfunction consistent with his left basal ganglia infarct. Supportive care, optimize metabolic status, treat infections as above, minimize sedation and avoid precipitating medications. Amantadine initiated by neurology and hopefully will help his abulia. Per Neuro f/u 6/11> suspect MS will plateau at a level of cognition and motor function that will be significantly lower than his preadmission baseline. Mental status continues to slowly  improve. 4. Acute ischemic  stroke: Due to small vessel involvement from his cryptococcal infection. As per neurology, continue with antifungal treatment and no role for antiplatelet agents or statins in this setting. Stable without change. 5. HIV/AIDS: CD4 count this admission 21 and high viral load >1 million. Infectious disease following. Continue prophylaxis with Bactrim and azithromycin. ART not initiated due to see him infection. No change. 6. Perirectal abscess: Status post I&D. Twice a day dressing changes. WOC following. 7. Genital herpes: Completed valacyclovir, almost 11 days. 8. Acute kidney injury: Resolved. 9. Hypomagnesemia: Replace & follow as needed. Pharmacy managing. 10. Hyponatremia/? SIADH: Stable. 11. Diarrhea: Stool culture negative.? Resolved. 12. Anemia of chronic disease: Stable. 13. Dysphagia: Cor Trak removed and dysphagia 1 diet with honey thickened liquids started on 6/12. Encourage oral intake and ST to follow. 14. Elevated blood pressure: When necessary IV hydralazine and monitor.   DVT prophylaxis: Subcutaneous heparin Code Status: Full Family Communication: None at bedside today. Disposition: Transfer from stepdown unit to telemetry on 6/13. DC to SNF when improved> Rehab did not feel appropriate for CIR.   Consultants:  CCM-signed off Infectious disease Neurology Neurosurgery   Procedures:  OETT 8.0 5/28 - 6/5 OGT 5/28 - 6/5 Lumbar Drain 5/28 >>DC'ed 6/10 Foley 5/28 >> 6/12  PIV  Cor trak> discontinued 6/12   Antimicrobials:  Triumeq 5/23 - 5/24 Flagyl 5/23 - 5/24 Vancomycin 5/23 - 5/24 Doxycycline 5/24 - 5/25 Rocephin 5/23 - 5/30 Augmentin 5/24 - 5/25 Bactrim 5/23 >> Flucytosine 5/24 >> Valtrex 5/24 >> 6/3  Azithromycin qweek 5/24 >> Ambisome 5/24 >>   MICROBIOLOGY: MRSA PCR 5/23:  Negative  Blood Cultures x2 5/22:  2/2 Bottles Positive Cryptococcus neoformans  Blood Cultures x2 5/23:  2/2 Bottles Positive Cryptococcus neoformans   Wound Culture 5/23:  Multiple organisms present CSF Culture 5/24:  Abundant Cryptococcus neoformans  Quantiferon TB 5/26:  Negative  CSF Culture 5/27:  Cryptococcus neoformans  BAL RML 5/28:  Cryptococcus neoformans / AFB smear negative & culture pending Tracheal Aspirate AFB 5/30 >>> Smear negative / Ctx pending Blood Cultures x2 5/30:  Negative  Stool Culture 5/31:  Negative  Stool Gastrointestinal Panel 5/31:  Negative Stool C diff 5/31:  Negative  CSF Culture 6/1 >> Encapsulated yeast seen   Subjective: Patient awake and alert this morning. Soft voice but says his name and knows he is in the hospital. Follow simple instructions. When asked, he indicates that he is thirsty. As per RN, eating small amounts and requires coaxing but tolerating current diet and medications.  ROS: Unable due to mental status changes.  Objective:  Vitals:   01/11/17 0000 01/11/17 0424 01/11/17 0640 01/11/17 0905  BP:  (!) 148/91  (!) 159/99  Pulse:  92    Resp:  18    Temp:  98.1 F (36.7 C)  98.9 F (37.2 C)  TempSrc:  Oral  Oral  SpO2: 96% 98%    Weight:   60 kg (132 lb 4.4 oz) 62 kg (136 lb 11 oz)  Height:        Examination:  General exam: Pleasant young male, well built and nourished, lying comfortably supine in bed without distress.  Respiratory system: Clear to auscultation without wheezing, rhonchi or crackles. No increased work of breathing. Cor Trak now out. Cardiovascular system: S1 and S2 heard, RRR. No JVD, murmurs or pedal edema. Telemetry: SR in the 90s-ST in the 100s. Gastrointestinal system: Abdomen is nondistended, soft and nontender. No organomegaly or masses felt. Normal bowel sounds heard. Stable.  Foley discontinued. Central nervous system: Mental status as stated above. No focal neurological deficits.  Extremities: less rigidity of upper extremities and moves to command. No focal deficits.  Skin: Extensive tattooing of his body. Psychiatry: Judgement and insight  impaired. Mood & affect : Flat.    Data Reviewed: I have personally reviewed following labs and imaging studies  CBC:  Recent Labs Lab 01/05/17 0233 01/06/17 0332 01/07/17 0257 01/08/17 0206 01/09/17 0328  WBC 2.8* 4.7 5.5 7.6 4.5  NEUTROABS 1.7 3.5 4.1 6.3 3.2  HGB 8.6* 9.4* 9.1* 8.8* 9.0*  HCT 27.1* 29.9* 27.9* 27.1* 27.3*  MCV 86.0 86.2 84.0 84.2 83.7  PLT 255 285 244 209 580   Basic Metabolic Panel:  Recent Labs Lab 01/07/17 0257 01/08/17 0206 01/09/17 0328 01/10/17 0443 01/11/17 0343  NA 129*  131* 130*  130* 132* 131* 130*  K 4.0  4.0 3.6  3.6 3.7 3.6 3.7  CL 100*  100* 102  102 104 103 102  CO2 21*  21* _0 20*  GLUCOSE 90  92 117*  119* 101* 104* 96  BUN 22*  20 26*  26* 24* 23* 23*  CREATININE 1.12  1.11 1.00  0.95 0.88 0.82 1.06  CALCIUM 9.3  9.3 9.2  9.2 9.0 9.0 9.1  MG 1.7 1.6* 2.1 1.4* 1.9  PHOS 5.4* 4.8* 4.3 4.4 6.0*   Liver Function Tests:  Recent Labs Lab 01/07/17 0257 01/08/17 0206 01/09/17 0328 01/10/17 0443 01/11/17 0343  ALBUMIN 3.0* 2.8* 2.8* 2.6* 2.7*   Coagulation Profile:  Recent Labs Lab 01/06/17 1654  INR 1.09   CBG:  Recent Labs Lab 01/10/17 1641 01/10/17 2101 01/10/17 2314 01/11/17 0408 01/11/17 0845  GLUCAP 103* 118* 129* 98 100*    Recent Results (from the past 240 hour(s))  CSF culture with Stat gram stain     Status: None   Collection Time: 01/05/17  9:00 AM  Result Value Ref Range Status   Specimen Description CSF  Final   Special Requests Immunocompromised  Final   Gram Stain   Final    WBC PRESENT, PREDOMINANTLY MONONUCLEAR YEAST CRITICAL RESULT CALLED TO, READ BACK BY AND VERIFIED WITH: Barnett Abu, RN AT 1112 ON 01/05/17 BY C. JESSUP, MLT. CYTOSPIN SMEAR    Culture NO GROWTH 3 DAYS  Final   Report Status 01/09/2017 FINAL  Final         Radiology Studies: No results found.      Scheduled Meds: . amantadine  100 mg Oral BID WC  . azithromycin  1,200 mg Oral  Weekly  . famotidine  20 mg Per Tube QHS  . feeding supplement (PRO-STAT SUGAR FREE 64)  30 mL Per Tube BID  . flucytosine  1,500 mg Per Tube Q6H  . heparin subcutaneous  5,000 Units Subcutaneous Q8H  . magnesium oxide  400 mg Oral BID  . mouth rinse  15 mL Mouth Rinse BID  . mouth rinse  15 mL Mouth Rinse BID  . potassium chloride  60 mEq Oral Once  . sodium chloride flush  3 mL Intravenous Q12H  . sulfamethoxazole-trimethoprim  10 mL Per Tube Daily   Continuous Infusions: . sodium chloride 50 mL/hr at 01/11/17 0631  . amphotericin  B  Liposome (AMBISOME) ADULT IV Stopped (01/10/17 1310)  . feeding supplement (JEVITY 1.2 CAL) 1,000 mL (01/10/17 1212)  . magnesium sulfate 1 - 4 g bolus IVPB    . sodium chloride Stopped (01/10/17 1330)  .  sodium chloride Stopped (01/10/17 1730)     LOS: 21 days     HONGALGI,[name], MD, FACP, FHM. [facility] Pager [phone]  If 7PM-7AM, please contact night-coverage [email or web address] Password [facility] 01/11/2017, 11:36 AM

## 2017-01-11 NOTE — Progress Notes (Signed)
Physical Therapy Treatment Patient Details Name: William Pena MRN: [id number] DOB: [birth year] Today's Date: 01/11/2017    History of Present Illness Patient is a 31 y/o male presents with sepsis secondary to perirectal abscess s/p I&D 5/23 and disseminated cryptococcal infection including meningitis, bacteremia, and likely pneumonia. Intubated 5/28-6/5. Pt found to have acute ischemic strokes. 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 Second MRI- extension of the diffusion restriction in the left basal ganglia and right basal ganglia. s/p Lumbar drain 5/30. PMH includes advanced hiv disease    PT Comments    Patient seen for return to bed activity and functional UE ROM. Patient tolerated well, educated mom on ROM activities for UE and LE. Mom had inquired about splinting for wrists, advised I would defer this to the OT treatment plan. Will have OT follow up with patient's mom. Will continue to see and progress as tolerated.   Follow Up Recommendations  SNF     Equipment Recommendations  Other (comment) (TBA in next venue)    Recommendations for Other Services       Precautions / Restrictions Precautions Precautions: Fall Required Braces or Orthoses: Other Brace/Splint (bil. elbow splints ) Restrictions Weight Bearing Restrictions: No    Mobility  Bed Mobility Overal bed mobility: Needs Assistance Bed Mobility: Rolling Rolling: Total assist         General bed mobility comments: total assist for rolling and hygiene in bed  Transfers Overall transfer level: Needs assistance   Transfers: Lateral/Scoot Transfers          Lateral/Scoot Transfers: Total assist;+2 physical assistance General transfer comment: lateral draw sheet transfer for return to bed from drop arm recliner  Ambulation/Gait                 Stairs            Wheelchair Mobility    Modified Rankin (Stroke Patients  Only) Modified Rankin (Stroke Patients Only) Pre-Morbid Rankin Score: No symptoms Modified Rankin: Severe disability     Balance                                            Cognition Arousal/Alertness: Awake/alert Behavior During Therapy: Flat affect Overall Cognitive Status: Impaired/Different from baseline Area of Impairment: Attention;Following commands;Problem solving                 Orientation Level: Disoriented to;Place;Time;Situation Current Attention Level: Focused   Following Commands: Follows one step commands inconsistently     Problem Solving: Slow processing;Decreased initiation;Requires verbal cues;Requires tactile cues General Comments: Patient minimally responsive to activities and commands during session      Exercises Other Exercises Other Exercises: PROM bilateral UEs, tone inhibition activities performed at bedside, educated mom on positioning and ROM. Other Exercises: Second round of BLE PROM performed     General Comments General comments (skin integrity, edema, etc.): VSS throughout session      Pertinent Vitals/Pain Pain Assessment: Faces Faces Pain Scale: Hurts little more Pain Location: grimacing during PROM Pain Descriptors / Indicators: Grimacing Pain Intervention(s): Repositioned    Home Living                      Prior Function            PT Goals (current goals can now be found in the care plan section)  Acute Rehab PT Goals Patient Stated Goal: mother presetn at bedside Progress towards PT goals: Not progressing toward goals - comment    Frequency    Min 3X/week      PT Plan Current plan remains appropriate    Co-evaluation              AM-PAC PT "6 Clicks" Daily Activity  Outcome Measure  Difficulty turning over in bed (including adjusting bedclothes, sheets and blankets)?: Total Difficulty moving from lying on back to sitting on the side of the bed? : Total Difficulty  sitting down on and standing up from a chair with arms (e.g., wheelchair, bedside commode, etc,.)?: Total Help needed moving to and from a bed to chair (including a wheelchair)?: Total Help needed walking in hospital room?: Total Help needed climbing 3-5 steps with a railing? : Total 6 Click Score: 6    End of Session   Activity Tolerance: Patient tolerated treatment well Patient left: in bed;with call bell/phone within reach;with bed alarm set;with family/visitor present Nurse Communication: Mobility status PT Visit Diagnosis: Muscle weakness (generalized) (M62.81);Other symptoms and signs involving the nervous system (R29.898);Hemiplegia and hemiparesis Hemiplegia - Right/Left: Left Hemiplegia - dominant/non-dominant: Non-dominant Hemiplegia - caused by: Cerebral infarction     Time: 0981-1914 PT Time Calculation (min) (ACUTE ONLY): 20 min  Charges:  $Therapeutic Activity: 8-22 mins                    G Codes:       Alben Deeds, PT DPT  [phone]    Duncan Dull 01/11/2017, 3:40 PM

## 2017-01-12 LAB — CBC
HEMATOCRIT: 26.5 % — AB (ref 39.0–52.0)
HEMOGLOBIN: 8.5 g/dL — AB (ref 13.0–17.0)
MCH: 27.1 pg (ref 26.0–34.0)
MCHC: 32.1 g/dL (ref 30.0–36.0)
MCV: 84.4 fL (ref 78.0–100.0)
Platelets: 139 10*3/uL — ABNORMAL LOW (ref 150–400)
RBC: 3.14 MIL/uL — AB (ref 4.22–5.81)
RDW: 16.9 % — AB (ref 11.5–15.5)
WBC: 6.9 10*3/uL (ref 4.0–10.5)

## 2017-01-12 LAB — BASIC METABOLIC PANEL
ANION GAP: 9 (ref 5–15)
ANION GAP: 7 (ref 5–15)
BUN: 44 mg/dL — AB (ref 6–20)
BUN: 45 mg/dL — ABNORMAL HIGH (ref 6–20)
CHLORIDE: 110 mmol/L (ref 101–111)
CO2: 18 mmol/L — ABNORMAL LOW (ref 22–32)
CO2: 18 mmol/L — AB (ref 22–32)
Calcium: 9.1 mg/dL (ref 8.9–10.3)
Calcium: 8.9 mg/dL (ref 8.9–10.3)
Chloride: 113 mmol/L — ABNORMAL HIGH (ref 101–111)
Creatinine, Ser: 2.84 mg/dL — ABNORMAL HIGH (ref 0.61–1.24)
Creatinine, Ser: 2.56 mg/dL — ABNORMAL HIGH (ref 0.61–1.24)
GFR calc non Af Amer: 32 mL/min — ABNORMAL LOW (ref >60)
GFR, EST AFRICAN AMERICAN: 32 mL/min — AB (ref >60)
GFR, EST AFRICAN AMERICAN: 37 mL/min — AB (ref >60)
GFR, EST NON AFRICAN AMERICAN: 28 mL/min — AB (ref >60)
Glucose, Bld: 95 mg/dL (ref 65–99)
Glucose, Bld: 80 mg/dL (ref 65–99)
POTASSIUM: 4.1 mmol/L (ref 3.5–5.1)
POTASSIUM: 3.8 mmol/L (ref 3.5–5.1)
SODIUM: 137 mmol/L (ref 135–145)
Sodium: 138 mmol/L (ref 135–145)

## 2017-01-12 LAB — RENAL FUNCTION PANEL
ALBUMIN: 2.5 g/dL — AB (ref 3.5–5.0)
ANION GAP: 7 (ref 5–15)
BUN: 41 mg/dL — ABNORMAL HIGH (ref 6–20)
CHLORIDE: 109 mmol/L (ref 101–111)
CO2: 19 mmol/L — ABNORMAL LOW (ref 22–32)
Calcium: 9 mg/dL (ref 8.9–10.3)
Creatinine, Ser: 2.71 mg/dL — ABNORMAL HIGH (ref 0.61–1.24)
GFR, EST AFRICAN AMERICAN: 34 mL/min — AB (ref >60)
GFR, EST NON AFRICAN AMERICAN: 30 mL/min — AB (ref >60)
Glucose, Bld: 90 mg/dL (ref 65–99)
PHOSPHORUS: 6.9 mg/dL — AB (ref 2.5–4.6)
Potassium: 4 mmol/L (ref 3.5–5.1)
Sodium: 135 mmol/L (ref 135–145)

## 2017-01-12 LAB — CULTURE, FUNGUS WITHOUT SMEAR

## 2017-01-12 LAB — MAGNESIUM: Magnesium: 2.6 mg/dL — ABNORMAL HIGH (ref 1.7–2.4)

## 2017-01-12 MED ORDER — SULFAMETHOXAZOLE-TRIMETHOPRIM 200-40 MG/5ML PO SUSP: 10.0000 mL | Freq: Every day | ORAL | Status: DC

## 2017-01-12 MED ORDER — DEXTROSE 5 % IV SOLN
240.0000 mg | INTRAVENOUS | Status: DC
  Filled 2017-01-12: qty 240

## 2017-01-12 MED ORDER — DEXTROSE 5 % IV SOLN
240.0000 mg | INTRAVENOUS | Status: DC
  Administered 2017-01-12 – 2017-01-18 (×7): 240 mg via INTRAVENOUS
  Filled 2017-01-12 (×10): qty 240

## 2017-01-12 MED ORDER — SODIUM CHLORIDE 0.9 % IV BOLUS (SEPSIS)
1000.0000 mL | INTRAVENOUS | Status: DC
  Administered 2017-01-13 – 2017-01-18 (×5): 1000 mL via INTRAVENOUS

## 2017-01-12 MED ORDER — FLUCYTOSINE 250 MG PO CAPS
1500.0000 mg | ORAL_CAPSULE | Freq: Four times a day (QID) | ORAL | Status: DC
  Administered 2017-01-12 – 2017-01-19 (×28): 1500 mg via ORAL
  Filled 2017-01-12 (×31): qty 6

## 2017-01-12 MED ORDER — SODIUM CHLORIDE 0.9 % IV BOLUS (SEPSIS)
1000.0000 mL | INTRAVENOUS | Status: DC
  Administered 2017-01-12: 1000 mL via INTRAVENOUS

## 2017-01-12 MED ORDER — SODIUM CHLORIDE 0.9 % IV BOLUS (SEPSIS): 1000.0000 mL | Freq: Once | INTRAVENOUS | Status: DC

## 2017-01-12 MED ORDER — SODIUM CHLORIDE 0.9 % IV BOLUS (SEPSIS): 1000.0000 mL | INTRAVENOUS | Status: DC

## 2017-01-12 MED ORDER — SODIUM CHLORIDE 0.9 % IV BOLUS (SEPSIS)
1000.0000 mL | INTRAVENOUS | Status: DC
  Administered 2017-01-12 – 2017-01-18 (×7): 1000 mL via INTRAVENOUS

## 2017-01-12 NOTE — Progress Notes (Signed)
  Speech Language Pathology Treatment: Dysphagia;Cognitive-Linquistic  Patient Details Name: William Pena MRN: [id number] DOB: November 23, 1985 Today's Date: 01/12/2017 Time: 6945-0388 SLP Time Calculation (min) (ACUTE ONLY): 23 min  Assessment / Plan / Recommendation Clinical Impression  Micahel inconsistently responded to yes/no questions with head nod/shakes and inconsistent ability to phonate on command with max verbal requests. Spontaneously stated "want ice water". Explained rationale and reason for thick liquids. Delayed cough and multiple swallows with honey thick juice and magic cup. Mildly increased respiratory function. Cued throat clears are weak. Expiratory muscle training for 10 repetitions with mild strength. Intake has been low per prior ST and pt may need longer term alternate means if quality and quality does not improve.       HPI HPI: Patient is a 31 y/o male presents with sepsis secondary to perirectal abscess s/p I&D 5/23 and disseminated cryptococcal infection including meningitis, bacteremia, and likely pneumonia. Intubated 5/28-6/5. 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 Second MRI- extension of the diffusion restriction in the left basal ganglia and right basal ganglia. s/p Lumbar drain 5/30. PMH includes advanced hiv disease      SLP Plan  Continue with current plan of care       Recommendations  Diet recommendations: Dysphagia 1 (puree);Honey-thick liquid Liquids provided via: Cup Medication Administration: Crushed with puree Supervision: Staff to assist with self feeding;Full supervision/cueing for compensatory strategies Compensations: Minimize environmental distractions;Slow rate;Small sips/bites Postural Changes and/or Swallow Maneuvers: Seated upright 90 degrees                Oral Care Recommendations: Oral care QID Follow up Recommendations: 24 hour supervision/assistance SLP Visit Diagnosis:  Dysphagia, oropharyngeal phase (R13.12) Plan: Continue with current plan of care       GO                Houston Siren 01/12/2017, 10:15 AM  Orbie Pyo Colvin Caroli.Ed [employer] [phone]

## 2017-01-12 NOTE — Progress Notes (Signed)
[facility] for Infectious Disease    Date of Admission:  12/20/2016   Total days of antibiotics 22        L-Ampho + Flucytosine        OI proph (Bactrim / Azith)          ID: William Pena is a 31 y.o. male with disseminated cryptococal disease (meningitis, pneumonia, cerebral infarction).   Principal Problem:   Perirectal abscess Active Problems:   Chronic pain   AIDS (acquired immune deficiency syndrome) (HCC)   Hyponatremia   Normocytic anemia   Elevated LFTs   Pulmonary infiltrates   Hypomagnesemia   Encephalopathy acute   SIRS (systemic inflammatory response syndrome) (HCC)   Acute respiratory failure (HCC)   Rectal abscess   Cryptococcal meningoencephalitis (HCC)   Cryptococcosis (HCC)   Cavitary pneumonia   Diffuse lymphadenopathy   Drug rash   Elevated intracranial pressure   HIV disease (HCC)   Lymphadenopathy of head and neck   HIV (human immunodeficiency virus infection) ([place])   Meningitis   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   Medications:  . amantadine  100 mg Oral BID WC  . azithromycin  1,200 mg Oral Weekly  . famotidine  20 mg Oral QHS  . feeding supplement (PRO-STAT SUGAR FREE 64)  30 mL Oral BID  . flucytosine  1,500 mg Oral Q6H  . heparin subcutaneous  5,000 Units Subcutaneous Q8H  . magnesium oxide  400 mg Oral BID  . mouth rinse  15 mL Mouth Rinse BID  . mouth rinse  15 mL Mouth Rinse BID  . sodium chloride flush  3 mL Intravenous Q12H    Objective: Vital signs in last 24 hours: Temp:  [99 F (37.2 C)-99.7 F (37.6 C)] 99.7 F (37.6 C) (06/14 1455) Pulse Rate:  [98-123] 98 (06/14 1455) Resp:  [15-20] 18 (06/14 1455) BP: (122-167)/(77-109) 122/77 (06/14 1455) SpO2:  [98 %-100 %] 98 % (06/14 0600)   General appearance: alert and in no distress. Sitting in bed quietly. Eyes: conjunctivae/corneas clear. PERRL, EOM's intact.    Neck: no carotid bruit, no JVD, supple, symmetrical, trachea midline and improved lymphadenopathy. Resp: Clear to auscultation Cardio: regular rate and rhythm, S1, S2 normal, no murmur, click, rub or gallop Extremities: extremities normal, atraumatic, no cyanosis or edema Skin: color, texture, turgor normal. No rashes or lesions Neurologic: Alert. Slowed mental processes. Not as verbal today.   Lab Results  Recent Labs  01/12/17 0410 01/12/17 0748 01/12/17 1030  WBC  --   --  6.9  HGB  --   --  8.5*  HCT  --   --  26.5*  NA 135 137  --   K 4.0 4.1  --   CL 109 110  --   CO2 19* 18*  --   BUN 41* 44*  --   CREATININE 2.71* 2.84*  --    Liver Panel  Recent Labs  01/11/17 0343 01/12/17 0410  ALBUMIN 2.7* 2.5*   Microbiology: 01/05/17:  -WBC 18, 95% lymph -RBC 23 -glucose 40  -protein 96 -culture >> rare yeast on GS, no growth final report  Studies/Results: No results found.   Assessment/Plan: CM c/b small vessel infarcts=  - Day 22of L-ampho and flucytosine - extended induction - CSF 6/7 > Yeast on GS but NG on day 3 of culture final report  HIV Disease= - tx  deferred due to CM treatment - OI proph -continue with Azith weekly. Will stop Bactrim for now to limit nephrotoxic drugs.   Perirectal abscess=  - received a short course of abtx after debridement - wound and follow up care per surgical team   Pneumonia, Cryptococcal=  - 5/28 BAL Cx >>Cryptococcal Neoformans confirmed  - mtB PCR and AFB negative - Improved  Medication Monitoring =  - Creatinine up significantly today 1.06 >> 2.8 (verified with repeat lab) - Increased to 1 L infusion pre and 1 L infusion post - CBC to be drawn today to monitor for pancytopenia on flucytosine   [name], MSN, NP-C [facility] for Infectious [place] [facility] Cell: [phone] Pager: [phone]  01/12/2017, 3:21 PM

## 2017-01-12 NOTE — Progress Notes (Signed)
Patient has been placed on the Difficult to Place List to try to find SNF placement.   William Pena [name] LCSWA [phone]

## 2017-01-12 NOTE — Progress Notes (Addendum)
Nutrition Follow-up  DOCUMENTATION CODES:   Not applicable  INTERVENTION:   -D/c Prostat BID -Magic Cup TID with meals  NUTRITION DIAGNOSIS:   Increased nutrient needs related to wound healing as evidenced by estimated needs.  Ongoing  GOAL:   Patient will meet greater than or equal to 90% of their needs  Progressing  MONITOR:   PO intake, Supplement acceptance, Labs, Skin  REASON FOR ASSESSMENT:   Consult Calorie Count  ASSESSMENT:   Pt with PMH of HIV/AIDS (hx of noncompliance in part due to incarceration) and chronic perirectal abscess which pt had I&D Dec 2017, Jan 2018 but has worsened since being in jail for the last 6 weeks also without HIV meds during this time. Pt admitted with perirectal abscess had repeat I&D 5/23. 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 Lumbar drain placed 5/29.    6/6- advanced to a dysphagia 2 diet with thin liquids 6/7- lumbar drain d/c 6/8- NPO due to increased signs of dysphagia 6/9- cortrak tube placed; s/p MBBS, recommend NPO 6/12- s/p MBSS, advanced to dysphagia 1 diet with honey thick liquids, cortrak removed  Pt very lethargic at time of visit. Would occasionally track this RD with eyes, but did not greet RD or answer questions.   Intake remains minimal; PO: 0-25%. Pt consumed 20% of breakfast this morning (156 kcals, 7 grams protein).   Calorie count ordered by MD this afternoon.   Labs reviewed: CBGS: 103-115.   Diet Order:  DIET - DYS 1 Room service appropriate? Yes; Fluid consistency: Honey Thick  Skin:   (colsed incision perineum, open wound buttocks)  Last BM:  01/09/17  Height:   Ht Readings from Last 1 Encounters:  12/27/16 5\' 6"  (1.676 m)    Weight:   Wt Readings from Last 1 Encounters:  01/11/17 136 lb 11 oz (62 kg)    Ideal Body Weight:  59 kg  BMI:  Body mass index is 22.06 kg/m.  Estimated Nutritional Needs:   Kcal:  1800-2000  Protein:  90-110 grams  Fluid:   > 1.8 L/day  EDUCATION NEEDS:   No education needs identified at this time  Tarrance Januszewski A. Jimmye Norman, RD, LDN, CDE Pager: [phone] After hours Pager: [phone]

## 2017-01-12 NOTE — Progress Notes (Signed)
Bladder scan showed urinary retention > 999 ml.  Foley catheter placed with an output of 1200 ml.

## 2017-01-12 NOTE — Progress Notes (Signed)
Pharmacy Consult:  Electrolyte Replacement  9 yom with advanced HIV disease currently on treatment for disseminated cryptococcal disease with bacteremia and meningitis. Pharmacy consulted to manage electrolytes.    SCr made large jump to 2.8 this morning. I spoke with S.Dixon from ID and she asked me to increase saline boluses before and after ambisome dose to 1L for each. She would discuss with Dr. [name] about need to hold Ambisome doses.  K stable at 4.1, magnesium slightly elevated at 2.6.   Plan: No repletion required today Continue Mg 400mg  PO BID, though could consider holding given elevated result this morning and AKI Continue to follow electrolytes, renal function, and ID plans closely   Thank you for allowing pharmacy to be a part of this patient's care.  Delayza Lungren D. Iyanla Eilers, PharmD, BCPS Clinical Pharmacist Pager: [phone] Clinical Phone for 01/12/2017 until 3:30pm: [phone] If after 3:30pm, please call main pharmacy at [phone] 01/12/2017 12:19 PM

## 2017-01-12 NOTE — Progress Notes (Signed)
Pt refused Ancobon that was placed in applesauce, have attempted to convince pt to take medication for roughly 30 minutes but pt continue to grimace and shake his head when medication was pressed against mouth. Pt's VSS and will continue to monitor.

## 2017-01-12 NOTE — Clinical Social Work Note (Signed)
Clinical Social Work Assessment  Patient Details  Name: William Pena MRN: [id number] Date of Birth: 23-Jun-1986  Date of referral:  01/12/17               Reason for consult:  Facility Placement                Permission sought to share information with:  Facility Art therapist granted to share information::  No (Disoriented)  Name::     Music therapist::  SNFs  Relationship::  Mother  Contact Information:  [phone]  Housing/Transportation Living arrangements for the past 2 months:  Homeless Source of Information:  Parent Patient Interpreter Needed:  None Criminal Activity/Legal Involvement Pertinent to Current Situation/Hospitalization:  No - Comment as needed Significant Relationships:  Parents Lives with:  Self Do you feel safe going back to the place where you live?  No Need for family participation in patient care:  Yes (Comment)  Care giving concerns:  CSW received consult for possible SNF placement at time of discharge. Patient is disoriented. CSW spoke with patient's mother regarding PT recommendation of SNF placement at time of discharge. Patient's mother reported that she is unable to care for the patient and she is divorced from the patient's father. She expressed understanding of PT recommendation and is agreeable to SNF placement at time of discharge. CSW to continue to follow and assist with discharge planning needs.   Social Worker assessment / plan:  CSW spoke with patient's mother concerning possibility of rehab at [facility] before returning home.  Employment status:  Unemployed Forensic scientist:  Self Pay (Medicaid Pending) PT Recommendations:  [place] / Referral to community resources:  [place]  Patient/Family's Response to care:  Patient's mother recognizes need for rehab before returning home and is agreeable to a SNF. CSW explained barriers to placement such as lack of insurance and that  patient may not be placed locally. Patient's mother asked about applying for Guardianship. CSW provided info.   Patient/Family's Understanding of and Emotional Response to Diagnosis, Current Treatment, and Prognosis:  Patient/family is realistic regarding therapy needs and expressed being hopeful for SNF placement. Patient's mother expressed understanding of CSW role and discharge process. She was very tearful in talking about patient's medical condition. No questions/concerns about plan or treatment.    Emotional Assessment Appearance:  Appears stated age Attitude/Demeanor/Rapport:  Unable to Assess Affect (typically observed):  Unable to Assess Orientation:  Oriented to Self Alcohol / Substance use:  Not Applicable Psych involvement (Current and /or in the community):  No (Comment)  Discharge Needs  Concerns to be addressed:  Care Coordination Readmission within the last 30 days:  No Current discharge risk:  Homeless, Dependent with Mobility, Cognitively Impaired Barriers to Discharge:  Continued Medical Work up   [employer], [place] 01/12/2017, 9:13 AM

## 2017-01-12 NOTE — Progress Notes (Addendum)
PROGRESS NOTE   William Pena  MVE:[id number]    DOB: Mar 06, 1986    DOA: 12/20/2016  PCP: [name], MD   I have briefly reviewed patients previous medical records in [facility].  Brief Narrative:  31 year old male with a PMH of HIV/AIDS (last CD4 on 12/21/16:21), just started back on ART 2 months PTA, however he was incarcerated and reported that he was not getting his medications, admitted on 5/22 for perirectal abscess. During admission he was also noted to have headache, fever, chills, night sweats, neck pain and lymphadenopathy. ID was consulted and recommended imaging of chest, LP and cultures be sent from perirectal I&D. Also his serum cryptococcal antigen titer was elevated raising the possibility of disseminated cryptococcal infection including lung involvement. Pulmonology was consulted by ID for bronchoscopy and washings in an effort to help determine if his CT changes reflect TB or possible pulmonary cryptococcal disease. Initially unable to perform bronchoscopy due to hypoxia. 5/28 patient had altered LOC, became very lethargic/obtunded requiring CT and LP, complicated by acute respiratory failure due to altered mental status that required mechanical ventilation. He had a lumbar CSF drain to depression arise. Now extubated, drain removed. Failed swallowing evaluation and has Cor trak forte tube feeds. Mental status slightly better but waxing and waning. ID, neurology, neurosurgery and CCM consulted during hospital course. Care transferred to stepdown and [place] on 01/09/17. MS slowly improving. Cor Trak discontinued 6/12 and diet initiated. Transferred to medical bed 6/13.Acute kidney injury on 6/14 (creatinine 2.8), likely due to acute urinary retention. Foley catheter inserted again. Follow BMP.  Assessment & Plan:   Principal Problem:   Perirectal abscess Active Problems:   Chronic pain   AIDS (acquired immune deficiency syndrome) (HCC)   Hyponatremia   Normocytic anemia  Elevated LFTs   Pulmonary infiltrates   Hypomagnesemia   Encephalopathy acute   SIRS (systemic inflammatory response syndrome) (HCC)   Acute respiratory failure (HCC)   Rectal abscess   Cryptococcal meningoencephalitis (HCC)   Cryptococcosis (HCC)   Cavitary pneumonia   Diffuse lymphadenopathy   Drug rash   Elevated intracranial pressure   HIV disease (HCC)   Lymphadenopathy of head and neck   HIV (human immunodeficiency virus infection) ([place])   Meningitis   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 D22Rx. ID following. Lumbar drain removed 6/10. Repeat CSF culture negative and final. As per ID input 6/13, consider continuing amphotericin/flucytosine through 6/20 and then transitioned to oral fluconazole on 6/21. Creatinine has abruptly increased to 2.8 on 6/14, suspect secondary to acute urinary retention and should improve with Foley catheter decompression. Await ID input regarding decision to hold amphotericin.  2. Acute urinary retention: Foley catheter had been removed 48 hours ago. Bladder scan showed >999 ML and Foley catheter had to be reinserted on 6/14 yielding 1.2 L right away. Unclear etiology. Continue Foley catheter. 3. Acute kidney injury: Creatinine went up from 1.06 > 2.84 in the last 24 hours. Secondary to problem #2. Follow BMP this evening and daily. Expect improvement. IV fluids. 4. Acute hypoxic respiratory failure secondary to cryptococcal pneumonia: Extubated 6/5. Breathing spontaneously and stable without hypoxia on room air. Patient unable to do IS or flutter valve due to mental status changes. Intermittent chest x-ray as needed. Chest x-ray 6/10: No edema or consolidation. 5. Acute encephalopathy: Multifactorial >cryptococcal meningitis, acute strokes, metabolic derangements, medication effect and respiratory failure.  Neurology follow-up 6/10 appreciated >overall improved but now has clear abulia and cognitive dysfunction consistent with his left basal ganglia infarct. Supportive care, optimize metabolic status, treat infections as above, minimize sedation and avoid precipitating medications. Amantadine initiated by neurology and hopefully will help his abulia. Per Neuro f/u 6/11> suspect MS will plateau at a level of cognition and motor function that will be significantly lower than his preadmission baseline. Mental status continued to improve but slowly. 6. Acute ischemic stroke: Due to small vessel involvement from his cryptococcal infection. As per neurology, continue with antifungal treatment and no role for antiplatelet agents or statins in this setting. Stable without change. 7. HIV/AIDS: CD4 count this admission 21 and high viral load >1 million. Infectious disease following. Continue prophylaxis with Bactrim and azithromycin. ART not initiated due to see him infection. No change. 8. Perirectal abscess: Status post I&D. Twice a day dressing changes. WOC following. 9. Genital herpes: Completed valacyclovir, almost 11 days. 10. Hypomagnesemia: Replace & follow as needed. Pharmacy managing. 11. Hyponatremia/? SIADH: Stable. 12. Diarrhea: Stool culture negative.? Resolved. 13. Anemia of chronic disease: Stable. 14. Dysphagia: Cor Trak removed and dysphagia 1 diet with honey thickened liquids started on 6/12. Encourage oral intake and ST to follow. As per discussion with speech therapy, not sure if patient is able to take enough to maintain nutrition. Check calorie count, encourage oral intake and patient will need to be fed. 15. Elevated blood pressure: When necessary IV hydralazine and monitor. 16. Thrombocytopenia: Unclear etiology.? Medication effect. Follow CBCs.   DVT prophylaxis: Subcutaneous heparin Code Status: Full Family Communication: None at bedside today. Disposition: Transferred from stepdown  unit to telemetry on 6/13. DC to SNF when improved> Rehab did not feel appropriate for CIR.   Consultants:  CCM-signed off Infectious disease Neurology Neurosurgery   Procedures:  OETT 8.0 5/28 - 6/5 OGT 5/28 - 6/5 Lumbar Drain 5/28 >>DC'ed 6/10 Foley 5/28 >> 6/12, 6/14 > PIV  Cor trak> discontinued 6/12   Antimicrobials:  Triumeq 5/23 - 5/24 Flagyl 5/23 - 5/24 Vancomycin 5/23 - 5/24 Doxycycline 5/24 - 5/25 Rocephin 5/23 - 5/30 Augmentin 5/24 - 5/25 Bactrim 5/23 >> Flucytosine 5/24 >> Valtrex 5/24 >> 6/3  Azithromycin qweek 5/24 >> Ambisome 5/24 >>   MICROBIOLOGY: MRSA PCR 5/23:  Negative  Blood Cultures x2 5/22:  2/2 Bottles Positive Cryptococcus neoformans  Blood Cultures x2 5/23:  2/2 Bottles Positive Cryptococcus neoformans  Wound Culture 5/23:  Multiple organisms present CSF Culture 5/24:  Abundant Cryptococcus neoformans  Quantiferon TB 5/26:  Negative  CSF Culture 5/27:  Cryptococcus neoformans  BAL RML 5/28:  Cryptococcus neoformans / AFB smear negative & culture pending Tracheal Aspirate AFB 5/30 >>> Smear negative / Ctx pending Blood Cultures x2 5/30:  Negative  Stool Culture 5/31:  Negative  Stool Gastrointestinal Panel 5/31:  Negative Stool C diff 5/31:  Negative  CSF Culture 6/1 >> Encapsulated yeast seen   Subjective: Alert. Looking around. Oriented to self. When asked if he wanted to eat more breakfast, he responded "maybe later". Intermittently refusing to take by mouth and not eating much. No pain reported.  ROS: Unable due to mental status changes.  Objective:  Vitals:   01/11/17 1706 01/11/17 2230 01/12/17 0212 01/12/17 0600  BP: (!) 157/80 (!) 160/82  (!) 141/77  Pulse: (!) 114 (!) 123 (!) 110 (!) 104  Resp: _0 Temp: 99.4 F (37.4 C) 99 F (37.2 C)  99.4 F (37.4 C)  TempSrc:  Axillary  Oral  SpO2: 100% 99%  98%  Weight:      Height:        Examination:  General exam: Pleasant young male, well built and nourished,  lying comfortably propped up in bed, looks much improved compared to couple days ago. Respiratory system: Clear to auscultation without increased work of breathing. Cardiovascular system: S1 and S2 heard, RRR. No JVD, murmurs or pedal edema. Telemetry: Sinus tachycardia mostly in the 100s. Occasional in the 130s.? Related to urinary retention. Gastrointestinal system: Nondistended, soft and nontender.?? Palpable bladder. No organomegaly or masses appreciated. Normal bowel sounds present. Central nervous system: Mental status as stated above and improving. No focal neurological deficits.  Extremities: less rigidity of upper extremities and moves to command. No focal deficits.  Skin: Extensive tattooing of his body. Psychiatry: Judgement and insight impaired. Mood & affect : Flat.    Data Reviewed: I have personally reviewed following labs and imaging studies  CBC:  Recent Labs Lab 01/06/17 0332 01/07/17 0257 01/08/17 0206 01/09/17 0328 01/12/17 1030  WBC 4.7 5.5 7.6 4.5 6.9  NEUTROABS 3.5 4.1 6.3 3.2  --   HGB 9.4* 9.1* 8.8* 9.0* 8.5*  HCT 29.9* 27.9* 27.1* 27.3* 26.5*  MCV 86.2 84.0 84.2 83.7 84.4  PLT 285 244 209 179 865*   Basic Metabolic Panel:  Recent Labs Lab 01/08/17 0206 01/09/17 0328 01/10/17 0443 01/11/17 0343 01/12/17 0410 01/12/17 0748  NA 130*  130* 132* 131* 130* 135 137  K 3.6  3.6 3.7 3.6 3.7 4.0 4.1  CL 102  102 104 103 102 109 110  CO2 _0 20* 19* 18*  GLUCOSE 117*  119* 101* 104* 96 90 95  BUN 26*  26* 24* 23* 23* 41* 44*  CREATININE 1.00  0.95 0.88 0.82 1.06 2.71* 2.84*  CALCIUM 9.2  9.2 9.0 9.0 9.1 9.0 9.1  MG 1.6* 2.1 1.4* 1.9 2.6*  --   PHOS 4.8* 4.3 4.4 6.0* 6.9*  --    Liver Function Tests:  Recent Labs Lab 01/08/17 0206 01/09/17 0328 01/10/17 0443 01/11/17 0343 01/12/17 0410  ALBUMIN 2.8* 2.8* 2.6* 2.7* 2.5*   Coagulation Profile:  Recent Labs Lab 01/06/17 1654  INR 1.09   CBG:  Recent Labs Lab  01/10/17 2314 01/11/17 0408 01/11/17 0845 01/11/17 1306 01/11/17 1634  GLUCAP 129* 98 100* 103* 115*    Recent Results (from the past 240 hour(s))  CSF culture with Stat gram stain     Status: None   Collection Time: 01/05/17  9:00 AM  Result Value Ref Range Status   Specimen Description CSF  Final   Special Requests Immunocompromised  Final   Gram Stain   Final    WBC PRESENT, PREDOMINANTLY MONONUCLEAR YEAST CRITICAL RESULT CALLED TO, READ BACK BY AND VERIFIED WITH: Barnett Abu, RN AT 1112 ON 01/05/17 BY C. JESSUP, MLT. CYTOSPIN SMEAR    Culture NO GROWTH 3 DAYS  Final   Report Status 01/09/2017 FINAL  Final         Radiology Studies: No results found.      Scheduled Meds: . amantadine  100 mg Oral BID WC  . azithromycin  1,200 mg Oral Weekly  . famotidine  20 mg Oral QHS  . feeding supplement (PRO-STAT SUGAR FREE 64)  30 mL Oral BID  . flucytosine  1,500 mg Oral Q6H  . heparin subcutaneous  5,000 Units Subcutaneous Q8H  . magnesium oxide  400 mg Oral BID  .  mouth rinse  15 mL Mouth Rinse BID  . mouth rinse  15 mL Mouth Rinse BID  . sodium chloride flush  3 mL Intravenous Q12H   Continuous Infusions: . sodium chloride 50 mL/hr at 01/12/17 0100  . sodium chloride     And  . [START ON 01/13/2017] sodium chloride     And  . amphotericin  B  Liposome (AMBISOME) ADULT IV 240 mg (01/12/17 1329)     LOS: 22 days     Kileigh Ortmann, MD, FACP, FHM. [facility] Pager [phone]  If 7PM-7AM, please contact night-coverage [email or web address] Password [facility] 01/12/2017, 2:48 PM

## 2017-01-13 DIAGNOSIS — N179 Acute kidney failure, unspecified: Secondary | ICD-10-CM

## 2017-01-13 LAB — CBC
HCT: 24.3 % — ABNORMAL LOW (ref 39.0–52.0)
Hemoglobin: 7.8 g/dL — ABNORMAL LOW (ref 13.0–17.0)
MCH: 27.1 pg (ref 26.0–34.0)
MCHC: 32.1 g/dL (ref 30.0–36.0)
MCV: 84.4 fL (ref 78.0–100.0)
PLATELETS: 126 10*3/uL — AB (ref 150–400)
RBC: 2.88 MIL/uL — AB (ref 4.22–5.81)
RDW: 17.3 % — AB (ref 11.5–15.5)
WBC: 5.6 10*3/uL (ref 4.0–10.5)

## 2017-01-13 LAB — RENAL FUNCTION PANEL
Albumin: 2.4 g/dL — ABNORMAL LOW (ref 3.5–5.0)
Anion gap: 8 (ref 5–15)
BUN: 41 mg/dL — ABNORMAL HIGH (ref 6–20)
CALCIUM: 9.3 mg/dL (ref 8.9–10.3)
CO2: 20 mmol/L — AB (ref 22–32)
CREATININE: 1.77 mg/dL — AB (ref 0.61–1.24)
Chloride: 112 mmol/L — ABNORMAL HIGH (ref 101–111)
GFR calc non Af Amer: 50 mL/min — ABNORMAL LOW (ref >60)
GFR, EST AFRICAN AMERICAN: 57 mL/min — AB (ref >60)
GLUCOSE: 112 mg/dL — AB (ref 65–99)
Phosphorus: 6 mg/dL — ABNORMAL HIGH (ref 2.5–4.6)
Potassium: 3.9 mmol/L (ref 3.5–5.1)
SODIUM: 140 mmol/L (ref 135–145)

## 2017-01-13 LAB — MAGNESIUM: MAGNESIUM: 1.7 mg/dL (ref 1.7–2.4)

## 2017-01-13 MED ORDER — POTASSIUM CHLORIDE 20 MEQ/15ML (10%) PO SOLN
20.0000 meq | Freq: Once | ORAL | Status: DC
  Filled 2017-01-13: qty 15

## 2017-01-13 MED ORDER — MAGNESIUM SULFATE 2 GM/50ML IV SOLN
2.0000 g | Freq: Once | INTRAVENOUS | Status: AC
Start: 2017-01-13 — End: 2017-01-13
  Administered 2017-01-13: 2 g via INTRAVENOUS
  Filled 2017-01-13: qty 50

## 2017-01-13 MED ORDER — POTASSIUM CHLORIDE CRYS ER 20 MEQ PO TBCR: 20.0000 meq | EXTENDED_RELEASE_TABLET | Freq: Once | ORAL | Status: DC

## 2017-01-13 NOTE — Progress Notes (Addendum)
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 Pharmacy consulted to manage electrolytes.  SCr improved to 1.7 after straight cath removed ~1286mL of urine yesterday. Bactrim held by ID. To continue 1L of NS before and after Ambisome doses..  K 3.9, magnesium 1.7. SCr down to 1.7.   Plan: KCl 83mEq po x1 and magnesium sulfate 2g IV x1 for repletion today Continue Mg 400mg  PO BID Continue to follow electrolytes, renal function, and ID plans closely  Continue Ambisome 240mg  IV q24h with 1L of NS before and after dose Continue Flucytosine 1500mg  PO q6h   Thank you for allowing pharmacy to be a part of this patient's care.  Pocahontas Cohenour D. Shrey Boike, PharmD, BCPS Clinical Pharmacist Pager: [phone] Clinical Phone for 01/13/2017 until 3:30pm: [phone] If after 3:30pm, please call main pharmacy at [phone] 01/13/2017 11:08 AM

## 2017-01-13 NOTE — Progress Notes (Signed)
Calorie Count Note  48 hour calorie count ordered.  Diet: Dysphagia 1 with thin liquids Supplements: Magic Cup TID  Spoke with pt and mother at bedside. Pt mother reports a vast improvement in mental status over the past 48 hours. Noted pt appeared to be more interactive with this RD at rime of visit; noted pt would look RD on the eye and answer close ended questions. He was also able to identify that visitor in the room was his mother.   Pt mother is an experienced CNA with many years of nursing home experience. She is very familiar with current diet order and plans on feeding pt lunch today. Noted breakfast tray on tray able; pt consumed only thickened juice. Discussed appropriate food items for pt diet. Pt mother is considering bringing in appropriate outside food with staff approval. He does not like Magic Cups.  Breakfast: 156 kcals, 7 grams protein Lunch: 0% Dinner: 80 kcals, 0 grams protein Supplements: 0%  Total intake: 236 kcal (13% of minimum estimated needs)  7 grams protein (8% of minimum estimated needs)  Nutrition Dx: Increased nutrient needs related to wound healing as evidenced by estimated needs; ongoing  Goal: Patient will meet greater than or equal to 90% of their needs; unmet  Intervention:   -Follow-up with calorie count on Monday, 01/16/17 -Consider supplemental TF to assist pt in meeting needs- recommend:  Jevity 1.2 @ 60 ml/hr (1440 ml/day) 30 ml Prostat BID Provides: 1928 kcal, 110 grams protein, and 1166 ml free water.   Jory Tanguma A. Jimmye Norman, RD, LDN, CDE Pager: [phone] After hours Pager: [phone]

## 2017-01-13 NOTE — Progress Notes (Signed)
  Speech Language Pathology Treatment: Dysphagia  Patient Details Name: William Pena MRN: [id number] DOB: Mar 18, 1986 Today's Date: 01/13/2017 Time: 0932-6712 SLP Time Calculation (min) (ACUTE ONLY): 20 min  Assessment / Plan / Recommendation Clinical Impression  [name] more alert and mildly improved sustained attention this session. Responded to biographical questions inconsistently due to verbal apraxia with decreased vocal intensity and requires additional time to respond. Requested "ice water", counted 1-5 with looking at SLP's fingers.   Consumed vanilla pudding and honey thick juice today without indications of penetration/aspiration. Max verbal cues for compensatory strategy for volitional clear throat that was strong for 1 out of 4 attempts. Concern is po quantity and ability to maintain nutritional needs for rehabilitation. Consider re-placement of NGT and continue po's to supplement.    HPI HPI: Patient is a 31 y/o male presents with sepsis secondary to perirectal abscess s/p I&D 5/23 and disseminated cryptococcal infection including meningitis, bacteremia, and likely pneumonia. Intubated 5/28-6/5. 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 Second MRI- extension of the diffusion restriction in the left basal ganglia and right basal ganglia. s/p Lumbar drain 5/30. PMH includes advanced hiv disease      SLP Plan  Continue with current plan of care       Recommendations  Diet recommendations: Dysphagia 1 (puree);Honey-thick liquid Liquids provided via: Cup;No straw Medication Administration: Crushed with puree Supervision: Staff to assist with self feeding;Full supervision/cueing for compensatory strategies Compensations: Minimize environmental distractions;Slow rate;Small sips/bites Postural Changes and/or Swallow Maneuvers: Seated upright 90 degrees                Oral Care Recommendations: Oral care BID Follow up  Recommendations: 24 hour supervision/assistance SLP Visit Diagnosis: Dysphagia, oropharyngeal phase (R13.12) Plan: Continue with current plan of care       GO                Houston Siren 01/13/2017, 3:45 PM  Orbie Pyo Colvin Caroli.Ed [employer] [phone]

## 2017-01-13 NOTE — Progress Notes (Signed)
PROGRESS NOTE   William Pena  UXN:[id number]    DOB: 08/18/1985    DOA: 12/20/2016  PCP: [name], MD   I have briefly reviewed patients previous medical records in [place].  Brief Narrative:  31 year old male with a PMH of HIV/AIDS (last CD4 on 12/21/16:21), just started back on ART 2 months PTA, however he was incarcerated and reported that he was not getting his medications, admitted on 5/22 for perirectal abscess. During admission he was also noted to have headache, fever, chills, night sweats, neck pain and lymphadenopathy. ID was consulted and recommended imaging of chest, LP and cultures be sent from perirectal I&D. Also his serum cryptococcal antigen titer was elevated raising the possibility of disseminated cryptococcal infection including lung involvement. Pulmonology was consulted by ID for bronchoscopy and washings in an effort to help determine if his CT changes reflect TB or possible pulmonary cryptococcal disease. Initially unable to perform bronchoscopy due to hypoxia. 5/28 patient had altered LOC, became very lethargic/obtunded requiring CT and LP, complicated by acute respiratory failure due to altered mental status that required mechanical ventilation. He had a lumbar CSF drain to depression arise. Now extubated, drain removed. Failed swallowing evaluation and has Cor trak forte tube feeds. Mental status slightly better but waxing and waning. ID, neurology, neurosurgery and CCM consulted during hospital course. Care transferred to stepdown and [facility] on 01/09/17. MS slowly improving. Cor Trak discontinued 6/12 and diet initiated. Transferred to medical bed 6/13.Acute kidney injury on 6/14 (creatinine 2.8), likely due to acute urinary retention & Amphotericin. Improving after foley and IV fluids.  Assessment & Plan:   Principal Problem:   Perirectal abscess Active Problems:   Chronic pain   AIDS (acquired immune deficiency syndrome) (HCC)   Hyponatremia   Normocytic  anemia   Elevated LFTs   Pulmonary infiltrates   Hypomagnesemia   Encephalopathy acute   SIRS (systemic inflammatory response syndrome) (HCC)   Acute respiratory failure (HCC)   Rectal abscess   Cryptococcal meningoencephalitis (HCC)   Cryptococcosis (HCC)   Cavitary pneumonia   Diffuse lymphadenopathy   Drug rash   Elevated intracranial pressure   HIV disease (HCC)   Lymphadenopathy of head and neck   HIV (human immunodeficiency virus infection) ([place])   Meningitis   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 D22Rx. ID following. Lumbar drain removed 6/10. Repeat CSF culture negative and final. As per ID input 6/13, consider continuing amphotericin/flucytosine through 6/20 and then transitioned to oral fluconazole on 6/21. Creatinine had abruptly increased to 2.8 on 6/14, suspect secondary to acute urinary retention and Amphotericin. Improved after foley and IV fluids around amphotericin dosing. Trend BMP. 2. Acute urinary retention: Foley catheter had been removed 48 hours ago. Bladder scan showed >999 ML and Foley catheter had to be reinserted on 6/14 yielding 1.2 L right away. Unclear etiology. Continue Foley catheter. 3. Acute kidney injury: Creatinine went up from 1.06 > 2.84 in the last 24 hours. Secondary to Acute Urinary Retention and amphotericin. Improving after above measures. 4. Acute hypoxic respiratory failure secondary to cryptococcal pneumonia: Extubated 6/5. Breathing spontaneously and stable without hypoxia on room air. Patient unable to do IS or flutter valve due to mental status changes. Intermittent chest x-ray as needed. Chest x-ray 6/10: No edema or consolidation. 5. Acute encephalopathy: Multifactorial >cryptococcal meningitis, acute strokes, metabolic derangements, medication effect and respiratory failure. Neurology  follow-up  6/10 appreciated >overall improved but now has clear abulia and cognitive dysfunction consistent with his left basal ganglia infarct. Supportive care, optimize metabolic status, treat infections as above, minimize sedation and avoid precipitating medications. Amantadine initiated by neurology and hopefully will help his abulia. Per Neuro f/u 6/11> suspect MS will plateau at a level of cognition and motor function that will be significantly lower than his preadmission baseline. Mental status continues to improve but slowly.More interactive. 6. Acute ischemic stroke: Due to small vessel involvement from his cryptococcal infection. As per neurology, continue with antifungal treatment and no role for antiplatelet agents or statins in this setting. Stable without change. 7. HIV/AIDS: CD4 count this admission 21 and high viral load >1 million. Infectious disease following. Continue prophylaxis with Bactrim and azithromycin. ART not initiated due to see him infection. No change. 8. Perirectal abscess: Status post I&D. Twice a day dressing changes. WOC following. 9. Genital herpes: Completed valacyclovir, almost 11 days. 10. Hypomagnesemia: Replace & follow as needed. Pharmacy managing. 11. Hyponatremia/? SIADH: Stable. 12. Diarrhea: Stool culture negative.? Resolved. 13. Anemia of chronic disease: Hb slowly dropping> no bleeding. ? D/T Flucytosine. Follow CBC's. 14. Dysphagia: Cor Trak removed and dysphagia 1 diet with honey thickened liquids started on 6/12. Encourage oral intake and ST to follow. As per discussion with speech therapy, not sure if patient is able to take enough to maintain nutrition. Check calorie count, encourage oral intake and patient will need to be fed. If PO intake doesn't continue to improve, may have to place back Cor Track. 15. Elevated blood pressure: When necessary IV hydralazine and monitor. 16. Thrombocytopenia: Unclear etiology.? Medication effect/Flucytosine. Follow  CBCs.   DVT prophylaxis: Subcutaneous heparin Code Status: Full Family Communication: None at bedside today. Disposition: Transferred from stepdown unit to telemetry on 6/13. DC to SNF when improved> Rehab did not feel appropriate for CIR.   Consultants:  CCM-signed off Infectious disease Neurology Neurosurgery   Procedures:  OETT 8.0 5/28 - 6/5 OGT 5/28 - 6/5 Lumbar Drain 5/28 >>DC'ed 6/10 Foley 5/28 >> 6/12, 6/14 > PIV  Cor trak> discontinued 6/12   Antimicrobials:  Triumeq 5/23 - 5/24 Flagyl 5/23 - 5/24 Vancomycin 5/23 - 5/24 Doxycycline 5/24 - 5/25 Rocephin 5/23 - 5/30 Augmentin 5/24 - 5/25 Bactrim 5/23 >> Flucytosine 5/24 >> Valtrex 5/24 >> 6/3  Azithromycin qweek 5/24 >> Ambisome 5/24 >>   MICROBIOLOGY: MRSA PCR 5/23:  Negative  Blood Cultures x2 5/22:  2/2 Bottles Positive Cryptococcus neoformans  Blood Cultures x2 5/23:  2/2 Bottles Positive Cryptococcus neoformans  Wound Culture 5/23:  Multiple organisms present CSF Culture 5/24:  Abundant Cryptococcus neoformans  Quantiferon TB 5/26:  Negative  CSF Culture 5/27:  Cryptococcus neoformans  BAL RML 5/28:  Cryptococcus neoformans / AFB smear negative & culture pending Tracheal Aspirate AFB 5/30 >>> Smear negative / Ctx pending Blood Cultures x2 5/30:  Negative  Stool Culture 5/31:  Negative  Stool Gastrointestinal Panel 5/31:  Negative Stool C diff 5/31:  Negative  CSF Culture 6/1 >> Encapsulated yeast seen   Subjective: Speaks in soft tone and slowly. Answers appropriately. Asking for water. "I'm good". Denies complaints.  ROS: Unable due to mental status changes.  Objective:  Vitals:   01/12/17 1455 01/12/17 2156 01/13/17 0743 01/13/17 1559  BP: 122/77 133/75 (!) 142/78 (!) 152/89  Pulse: 98 94 (!) 103 95  Resp: _0 Temp: 99.7 F (37.6 C) 98.6 F (37 C) 98.6 F (37 C) 99.5 F (37.5  C)  TempSrc:  Oral    SpO2:  97% 99% 99%  Weight:      Height:        Examination:  General  exam: Pleasant young male, well built and nourished, lying comfortably propped up in bed. Does not appear in any distress. Respiratory system: Clear to auscultation without increased work of breathing. Stable. Cardiovascular system: S1 and S2 heard, RRR. No JVD, murmurs or pedal edema. Telemetry: SR-ST in 100's. Gastrointestinal system: Nondistended, soft and nontender. No organomegaly or masses appreciated. Normal bowel sounds present. Foley+ Central nervous system: Mental status as stated above and improving. No focal neurological deficits.  Extremities: less rigidity of upper extremities and moves to command. No focal deficits.  Skin: Extensive tattooing of his body. Psychiatry: Judgement and insight impaired. Mood & affect : Flat.    Data Reviewed: I have personally reviewed following labs and imaging studies  CBC:  Recent Labs Lab 01/07/17 0257 01/08/17 0206 01/09/17 0328 01/12/17 1030 01/13/17 0530  WBC 5.5 7.6 4.5 6.9 5.6  NEUTROABS 4.1 6.3 3.2  --   --   HGB 9.1* 8.8* 9.0* 8.5* 7.8*  HCT 27.9* 27.1* 27.3* 26.5* 24.3*  MCV 84.0 84.2 83.7 84.4 84.4  PLT 244 209 179 139* 662*   Basic Metabolic Panel:  Recent Labs Lab 01/09/17 0328 01/10/17 0443 01/11/17 0343 01/12/17 0410 01/12/17 0748 01/12/17 1624 01/13/17 0530  NA 132* 131* 130* 135 137 138 140  K 3.7 3.6 3.7 4.0 4.1 3.8 3.9  CL 104 103 102 109 110 113* 112*  CO2 22 23 20* 19* 18* 18* 20*  GLUCOSE 101* 104* 96 90 95 80 112*  BUN 24* 23* 23* 41* 44* 45* 41*  CREATININE 0.88 0.82 1.06 2.71* 2.84* 2.56* 1.77*  CALCIUM 9.0 9.0 9.1 9.0 9.1 8.9 9.3  MG 2.1 1.4* 1.9 2.6*  --   --  1.7  PHOS 4.3 4.4 6.0* 6.9*  --   --  6.0*   Liver Function Tests:  Recent Labs Lab 01/09/17 0328 01/10/17 0443 01/11/17 0343 01/12/17 0410 01/13/17 0530  ALBUMIN 2.8* 2.6* 2.7* 2.5* 2.4*   Coagulation Profile: No results for input(s): INR, PROTIME in the last 168 hours. CBG:  Recent Labs Lab 01/10/17 2314 01/11/17 0408  01/11/17 0845 01/11/17 1306 01/11/17 1634  GLUCAP 129* 98 100* 103* 115*    Recent Results (from the past 240 hour(s))  CSF culture with Stat gram stain     Status: None   Collection Time: 01/05/17  9:00 AM  Result Value Ref Range Status   Specimen Description CSF  Final   Special Requests Immunocompromised  Final   Gram Stain   Final    WBC PRESENT, PREDOMINANTLY MONONUCLEAR YEAST CRITICAL RESULT CALLED TO, READ BACK BY AND VERIFIED WITH: Barnett Abu, RN AT 1112 ON 01/05/17 BY C. JESSUP, MLT. CYTOSPIN SMEAR    Culture NO GROWTH 3 DAYS  Final   Report Status 01/09/2017 FINAL  Final         Radiology Studies: No results found.      Scheduled Meds: . amantadine  100 mg Oral BID WC  . azithromycin  1,200 mg Oral Weekly  . famotidine  20 mg Oral QHS  . flucytosine  1,500 mg Oral Q6H  . heparin subcutaneous  5,000 Units Subcutaneous Q8H  . magnesium oxide  400 mg Oral BID  . mouth rinse  15 mL Mouth Rinse BID  . potassium chloride  20 mEq Oral Once  .  sodium chloride flush  3 mL Intravenous Q12H   Continuous Infusions: . sodium chloride 50 mL/hr at 01/13/17 1634  . sodium chloride Stopped (01/13/17 1635)   And  . sodium chloride Stopped (01/13/17 1240)   And  . amphotericin  B  Liposome (AMBISOME) ADULT IV Stopped (01/13/17 1503)     LOS: 23 days     Zephan Beauchaine, MD, FACP, FHM. [facility] Pager [phone]  If 7PM-7AM, please contact night-coverage [email or web address] Password [facility] 01/13/2017, 6:37 PM

## 2017-01-13 NOTE — Progress Notes (Signed)
Occupational Therapy Treatment Patient Details Name: William Pena MRN: [id number] DOB: [birth year] Today's Date: 01/13/2017    History of present illness Patient is a 31 y/o male presents with sepsis secondary to perirectal abscess s/p I&D 5/23 and disseminated cryptococcal infection including meningitis, bacteremia, and likely pneumonia. Intubated 5/28-6/5. Pt found to have acute ischemic strokes. 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 Second MRI- extension of the diffusion restriction in the left basal ganglia and right basal ganglia. s/p Lumbar drain 5/30. PMH includes advanced hiv disease   OT comments  PATIENT WAS HAND OVER HAND ASSIST REQUIRED FOR GROOMING TASKS. PATIENT WAS DEP FOR B UE ROM. PNT REQUIRED SLOW STRETCH TO B BICEPS. PATIENT HAD B ELBOW FLEX/EXT, SUP/PRON, WRIST FLEX/EXT, SHLD FLEX/EXT, ABD/ADD. PNT WAS ABLE TO FLEX EXT HIS RIGHT HAND ON COMMAND. PNT REQUIRED TOTAL ASSIST FOR L HAND FLEX/EXT.   Follow Up Recommendations  SNF    Equipment Recommendations       Recommendations for Other Services      Precautions / Restrictions Precautions Precautions: Fall Required Braces or Orthoses: Other Brace/Splint (bil. elbow splints ) Restrictions Weight Bearing Restrictions: No       Mobility Bed Mobility                  Transfers                      Balance                                           ADL either performed or assessed with clinical judgement   ADL       Grooming: Wash/dry hands;Wash/dry face;Total assistance;Bed level                                 General ADL Comments: PATIENT WAS DEP TO [facility] FACE AND HANDS AT DEP LEVEL.      Vision       Perception     Praxis      Cognition Arousal/Alertness: Awake/alert Behavior During Therapy: Flat affect Overall Cognitive Status: Impaired/Different from baseline Area of Impairment:  Attention;Following commands;Problem solving                 Orientation Level: Disoriented to;Place;Time;Situation Current Attention Level: Focused   Following Commands: Follows one step commands inconsistently     Problem Solving: Slow processing;Decreased initiation;Requires verbal cues;Requires tactile cues General Comments: PATIENT WAS PLEASANT DURING SESSION AND WOULD SMILE.        Exercises     Shoulder Instructions       General Comments      Pertinent Vitals/ Pain          Home Living                                          Prior Functioning/Environment              Frequency           Progress Toward Goals  OT Goals(current goals can now be found in the care plan section)  Progress towards OT goals: Progressing toward goals     Plan Discharge plan remains  appropriate    Co-evaluation                 AM-PAC PT "6 Clicks" Daily Activity     Outcome Measure   Help from another person eating meals?: Total Help from another person taking care of personal grooming?: Total Help from another person toileting, which includes using toliet, bedpan, or urinal?: Total Help from another person bathing (including washing, rinsing, drying)?: Total Help from another person to put on and taking off regular upper body clothing?: Total Help from another person to put on and taking off regular lower body clothing?: Total 6 Click Score: 6    End of Session        Activity Tolerance Patient tolerated treatment well   Patient Left in bed;with call bell/phone within reach;with bed alarm set   Nurse Communication          Time: 1007-1219 OT Time Calculation (min): 30 min  Charges: OT General Charges $OT Visit: 1 Procedure OT Treatments $Self Care/Home Management : 8-22 mins $Therapeutic Exercise: 7-58 mins  6 CLICKS   Avontae Burkhead 01/13/2017, 10:19 AM

## 2017-01-13 NOTE — Progress Notes (Deleted)
Progress Notes for 01/12/2017.  I had interviewed and examined patient, discussing with his RN and documenting a progress note for 01/12/17 but for some reason, I do not see my note for 6/14! Patient had developed AKI felt d/t Acute urinary retention, foley was placed and 1.2 liters was drained right away. Discussed with Pharmacy re adjusting meds. MS slowly improving. PO intake felt to be poor and calorie count initiated.  Vernell Leep, MD, FACP, FHM. [facility] Pager [phone]  If 7PM-7AM, please contact night-coverage [email or web address] Password [facility] 01/13/2017, 6:33 PM

## 2017-01-13 NOTE — Progress Notes (Signed)
Jud for Infectious Disease    Date of Admission:  12/20/2016   Total days of antibiotics 23        L-Ampho + Flucytosine        OI proph (azith)          ID: William Pena is a 31 y.o. male with disseminated cryptococal disease (meningitis, pneumonia, cerebral infarction).   Principal Problem:   Perirectal abscess Active Problems:   Chronic pain   AIDS (acquired immune deficiency syndrome) (HCC)   Hyponatremia   Normocytic anemia   Elevated LFTs   Pulmonary infiltrates   Hypomagnesemia   Encephalopathy acute   SIRS (systemic inflammatory response syndrome) (HCC)   Acute respiratory failure (HCC)   Rectal abscess   Cryptococcal meningoencephalitis (HCC)   Cryptococcosis (HCC)   Cavitary pneumonia   Diffuse lymphadenopathy   Drug rash   Elevated intracranial pressure   HIV disease (HCC)   Lymphadenopathy of head and neck   HIV (human immunodeficiency virus infection) ([place])   Meningitis   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 Recognizes mom at the bedside. Soft voice. Issues with urinary retention yesterday s/p foley insertion.   Medications:  . amantadine  100 mg Oral BID WC  . azithromycin  1,200 mg Oral Weekly  . famotidine  20 mg Oral QHS  . flucytosine  1,500 mg Oral Q6H  . heparin subcutaneous  5,000 Units Subcutaneous Q8H  . magnesium oxide  400 mg Oral BID  . mouth rinse  15 mL Mouth Rinse BID  . potassium chloride  20 mEq Oral Once  . sodium chloride flush  3 mL Intravenous Q12H    Objective: Vital signs in last 24 hours: Temp:  [98.6 F (37 C)-99.7 F (37.6 C)] 98.6 F (37 C) (06/15 0743) Pulse Rate:  [94-103] 103 (06/15 0743) Resp:  [18] 18 (06/14 2156) BP: (122-142)/(75-78) 142/78 (06/15 0743) SpO2:  [97 %-99 %] 99 % (06/15 0743)   General appearance: alert and in no distress. Sitting in bed quietly. Eyes: conjunctivae/corneas clear. PERRL, EOM's intact.    Neck: no carotid bruit, no JVD, supple, symmetrical, trachea midline and improved lymphadenopathy. Resp: Clear to auscultation Cardio: regular rate and rhythm, S1, S2 normal, no murmur, click, rub or gallop Extremities: extremities normal, atraumatic, no cyanosis or edema Skin: color, texture, turgor normal. No rashes or lesions Neurologic: Alert. Slowed mental processes.   Lab Results  Recent Labs  01/12/17 1030 01/12/17 1624 01/13/17 0530  WBC 6.9  --  5.6  HGB 8.5*  --  7.8*  HCT 26.5*  --  24.3*  NA  --  138 140  K  --  3.8 3.9  CL  --  113* 112*  CO2  --  18* 20*  BUN  --  45* 41*  CREATININE  --  2.56* 1.77*   Liver Panel  Recent Labs  01/12/17 0410 01/13/17 0530  ALBUMIN 2.5* 2.4*   Microbiology: 01/05/17:  -WBC 18, 95% lymph -RBC 23 -glucose 40  -protein 96 -culture >> rare yeast on GS, no growth final report  Assessment/Plan: CM c/b small vessel infarcts=  - Day 23of L-ampho and flucytosine - extended induction - CSF 6/7 > Yeast on GS but NG on day 3 of culture final report   HIV Disease= - tx deferred due to CM treatment - OI proph -continue with Azith weekly. Need to resume bactrim once creatinine stabilizes.  Perirectal abscess=  - received a short course of abtx after debridement - wound and follow up care per surgical team   Pneumonia, Cryptococcal=  - 5/28 BAL Cx >>Cryptococcal Neoformans confirmed  - mtB PCR and AFB negative - Improved  Medication Monitoring =  - Creatinine improved 2.8 > 1.7 - Urinary retention could have also contributed to acute rise.  - Continue 1 L NS infusion pre L-ampho administration and 1 L infusion post - CBC reviewed. Continue with current regimen.   William Madeira, MSN, NP-C [facility]ious [place] Cell: [phone] Pager: [phone]  01/13/2017, 1:44 PM

## 2017-01-14 LAB — RENAL FUNCTION PANEL
ANION GAP: 6 (ref 5–15)
Albumin: 2.5 g/dL — ABNORMAL LOW (ref 3.5–5.0)
BUN: 33 mg/dL — ABNORMAL HIGH (ref 6–20)
CHLORIDE: 113 mmol/L — AB (ref 101–111)
CO2: 20 mmol/L — AB (ref 22–32)
CREATININE: 1.49 mg/dL — AB (ref 0.61–1.24)
Calcium: 9.3 mg/dL (ref 8.9–10.3)
GFR calc non Af Amer: >60 mL/min (ref >60)
Glucose, Bld: 90 mg/dL (ref 65–99)
POTASSIUM: 3.9 mmol/L (ref 3.5–5.1)
Phosphorus: 5.1 mg/dL — ABNORMAL HIGH (ref 2.5–4.6)
Sodium: 139 mmol/L (ref 135–145)

## 2017-01-14 LAB — CBC
HEMATOCRIT: 24.3 % — AB (ref 39.0–52.0)
HEMOGLOBIN: 7.7 g/dL — AB (ref 13.0–17.0)
MCH: 27.4 pg (ref 26.0–34.0)
MCHC: 31.7 g/dL (ref 30.0–36.0)
MCV: 86.5 fL (ref 78.0–100.0)
Platelets: 125 10*3/uL — ABNORMAL LOW (ref 150–400)
RBC: 2.81 MIL/uL — ABNORMAL LOW (ref 4.22–5.81)
RDW: 17.5 % — ABNORMAL HIGH (ref 11.5–15.5)
WBC: 5.1 10*3/uL (ref 4.0–10.5)

## 2017-01-14 LAB — MAGNESIUM: Magnesium: 1.4 mg/dL — ABNORMAL LOW (ref 1.7–2.4)

## 2017-01-14 MED ORDER — MAGNESIUM SULFATE 4 GM/100ML IV SOLN
4.0000 g | Freq: Once | INTRAVENOUS | Status: AC
  Administered 2017-01-14: 4 g via INTRAVENOUS
  Filled 2017-01-14: qty 100

## 2017-01-14 MED ORDER — POTASSIUM CHLORIDE CRYS ER 20 MEQ PO TBCR
40.0000 meq | EXTENDED_RELEASE_TABLET | Freq: Once | ORAL | Status: AC
  Administered 2017-01-14: 40 meq via ORAL
  Filled 2017-01-14 (×2): qty 2

## 2017-01-14 MED ORDER — BOOST / RESOURCE BREEZE PO LIQD
1.0000 | Freq: Three times a day (TID) | ORAL | Status: DC
  Administered 2017-01-14 – 2017-01-16 (×5): 1 via ORAL

## 2017-01-14 NOTE — Progress Notes (Signed)
PROGRESS NOTE   William Pena  AOZ:[id number]    DOB: 06-10-1986    DOA: 12/20/2016  PCP: [name], MD   I have briefly reviewed patients previous medical records in [place].  Brief Narrative:  31 year old male with a PMH of HIV/AIDS (last CD4 on 12/21/16:21), just started back on ART 2 months PTA, however he was incarcerated and reported that he was not getting his medications, admitted on 5/22 for perirectal abscess. During admission he was also noted to have headache, fever, chills, night sweats, neck pain and lymphadenopathy. ID was consulted and recommended imaging of chest, LP and cultures be sent from perirectal I&D. Also his serum cryptococcal antigen titer was elevated raising the possibility of disseminated cryptococcal infection including lung involvement. Pulmonology was consulted by ID for bronchoscopy and washings in an effort to help determine if his CT changes reflect TB or possible pulmonary cryptococcal disease. Initially unable to perform bronchoscopy due to hypoxia. 5/28 patient had altered LOC, became very lethargic/obtunded requiring CT and LP, complicated by acute respiratory failure due to altered mental status that required mechanical ventilation. He had a lumbar CSF drain to depression arise. Now extubated, drain removed. Failed swallowing evaluation and has Cor trak forte tube feeds. Mental status slightly better but waxing and waning. ID, neurology, neurosurgery and CCM consulted during hospital course. Care transferred to stepdown and [place] on 01/09/17. MS slowly improving. Cor Trak discontinued 6/12 and diet initiated. Transferred to medical bed 6/13.Acute kidney injury on 6/14 (creatinine 2.8), likely due to acute urinary retention & Amphotericin. Improving after foley and IV fluids.  Assessment & Plan:   Principal Problem:   Perirectal abscess Active Problems:   Chronic pain   AIDS (acquired immune deficiency syndrome) (HCC)   Hyponatremia   Normocytic  anemia   Elevated LFTs   Pulmonary infiltrates   Hypomagnesemia   Encephalopathy acute   SIRS (systemic inflammatory response syndrome) (HCC)   Acute respiratory failure (HCC)   Rectal abscess   Cryptococcal meningoencephalitis (HCC)   Cryptococcosis (HCC)   Cavitary pneumonia   Diffuse lymphadenopathy   Drug rash   Elevated intracranial pressure   HIV disease (HCC)   Lymphadenopathy of head and neck   HIV (human immunodeficiency virus infection) ([place])   Meningitis   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 ID following. Lumbar drain removed 6/10. Repeat CSF culture negative and final. As per ID input 6/13, consider continuing amphotericin/flucytosine through 6/20 and then transition to oral fluconazole on 6/21. Creatinine had abruptly increased to 2.8 on 6/14, suspect secondary to acute urinary retention and Amphotericin. Improved after foley and IV fluids around amphotericin dosing. Solitary fever off 101.2 last night-unclear etiology. Continue to monitor without change. Bactrim on hold. 2. Acute urinary retention: Foley catheter had been removed but had to be reinserted on 6/14 yielding 1.2 L right away. Unclear etiology. Continue Foley catheter.? Trial of Flomax. 3. Acute kidney injury: Creatinine went up from 1.06 > 2.84. Secondary to Acute Urinary Retention and amphotericin. Improving after above measures. Creatinine down to 1.49. Continue to trend BMP. 4. Acute hypoxic respiratory failure secondary to cryptococcal pneumonia: Extubated 6/5. Breathing spontaneously and stable without hypoxia on room air. Patient unable to do IS or flutter valve due to mental status changes. Intermittent chest x-ray as needed. Chest x-ray 6/10: No edema or consolidation. 5. Acute encephalopathy:  Multifactorial >cryptococcal  meningitis, acute strokes, metabolic derangements, medication effect and respiratory failure. Neurology follow-up 6/10 appreciated >overall improved but now has clear abulia and cognitive dysfunction consistent with his left basal ganglia infarct. Supportive care, optimize metabolic status, treat infections as above, minimize sedation and avoid precipitating medications. Amantadine initiated by neurology and hopefully will help his abulia. Per Neuro f/u 6/11> suspect MS will plateau at a level of cognition and motor function that will be significantly lower than his preadmission baseline. Mental status continues to improve but slowly.More interactive. 6. Acute ischemic stroke: Due to small vessel involvement from his cryptococcal infection. As per neurology, continue with antifungal treatment and no role for antiplatelet agents or statins in this setting. Stable without change. 7. HIV/AIDS: CD4 count this admission 21 and high viral load >1 million. Infectious disease following. Continue prophylaxis with Bactrim and azithromycin. ART not initiated due to see him infection. No change. 8. Perirectal abscess: Status post I&D. Twice a day dressing changes. WOC following. 9. Genital herpes: Completed valacyclovir, almost 11 days. 10. Hypomagnesemia: Replace & follow as needed. Pharmacy managing. 11. Hyponatremia/? SIADH: Stable. 12. Diarrhea: Stool culture negative. Resolved. 13. Anemia of chronic disease: Hb slowly dropping> no bleeding. ? D/T Flucytosine. Stable over last 24 hours. Follow CBC's. Transfuse if hemoglobin <7 g per DL. 14. Dysphagia: Cor Trak removed and dysphagia 1 diet with honey thickened liquids started on 6/12. Continue to encourage oral intake. As per nurse tech this morning, ate 40% of his breakfast including full bowl of grits, half of scrambled eggs and some orange juice. As per mother, ate better at lunch, some home baby food.  15. Elevated blood pressure: When necessary IV hydralazine  and monitor. 16. Thrombocytopenia: Unclear etiology.? Medication effect/Flucytosine. Stable. No bleeding reported. Follow CBCs.   DVT prophylaxis: Subcutaneous heparin Code Status: Full Family Communication: Discussed with mother. Disposition: Transferred from stepdown unit to telemetry on 6/13. DC to SNF when improved> Rehab did not feel appropriate for CIR.   Consultants:  CCM-signed off Infectious disease Neurology Neurosurgery   Procedures:  OETT 8.0 5/28 - 6/5 OGT 5/28 - 6/5 Lumbar Drain 5/28 >>DC'ed 6/10 Foley 5/28 >> 6/12, 6/14 > PIV  Cor trak> discontinued 6/12   Antimicrobials:  Triumeq 5/23 - 5/24 Flagyl 5/23 - 5/24 Vancomycin 5/23 - 5/24 Doxycycline 5/24 - 5/25 Rocephin 5/23 - 5/30 Augmentin 5/24 - 5/25 Bactrim 5/23 >> Flucytosine 5/24 >> Valtrex 5/24 >> 6/3  Azithromycin qweek 5/24 >> Ambisome 5/24 >>   MICROBIOLOGY: MRSA PCR 5/23:  Negative  Blood Cultures x2 5/22:  2/2 Bottles Positive Cryptococcus neoformans  Blood Cultures x2 5/23:  2/2 Bottles Positive Cryptococcus neoformans  Wound Culture 5/23:  Multiple organisms present CSF Culture 5/24:  Abundant Cryptococcus neoformans  Quantiferon TB 5/26:  Negative  CSF Culture 5/27:  Cryptococcus neoformans  BAL RML 5/28:  Cryptococcus neoformans / AFB smear negative & culture pending Tracheal Aspirate AFB 5/30 >>> Smear negative / Ctx pending Blood Cultures x2 5/30:  Negative  Stool Culture 5/31:  Negative  Stool Gastrointestinal Panel 5/31:  Negative Stool C diff 5/31:  Negative  CSF Culture 6/1 >> Encapsulated yeast seen   Subjective: Seen this morning. Denied complaints-mostly nodded head. As per nurse tech, ate 40% of his breakfast this morning.  ROS: Unable due to mental status changes.  Objective:  Vitals:   01/13/17 2116 01/13/17 2300 01/14/17 0603 01/14/17 1416  BP: 125/73  134/83 134/83  Pulse: 93  94 98  Resp: 17  17 17  Temp: (!) 101.2 F (38.4 C) 99.7 F (37.6 C) 98.5 F  (36.9 C) 98.9 F (37.2 C)  TempSrc: Axillary Oral Oral   SpO2: 99%  100% 100%  Weight:      Height:        Examination:  General exam: Pleasant young male, well built and nourished, lying comfortably propped up in bed. Does not appear in any distress. Respiratory system: Clear to auscultation without increased work of breathing. Stable. Cardiovascular system: S1 and S2 heard, RRR. No JVD, murmurs or pedal edema. Telemetry: SR-ST in 100's. Gastrointestinal system: Nondistended, soft and nontender. No organomegaly or masses appreciated. Normal bowel sounds present. Foley+ Central nervous system: Mental status as stated above and improving. No focal neurological deficits.  Extremities: less rigidity of upper extremities and moves to command. No focal deficits.  Skin: Extensive tattooing of his body. Psychiatry: Judgement and insight impaired. Mood & affect : Flat.    Data Reviewed: I have personally reviewed following labs and imaging studies  CBC:  Recent Labs Lab 01/08/17 0206 01/09/17 0328 01/12/17 1030 01/13/17 0530 01/14/17 0633  WBC 7.6 4.5 6.9 5.6 5.1  NEUTROABS 6.3 3.2  --   --   --   HGB 8.8* 9.0* 8.5* 7.8* 7.7*  HCT 27.1* 27.3* 26.5* 24.3* 24.3*  MCV 84.2 83.7 84.4 84.4 86.5  PLT 209 179 139* 126* 161*   Basic Metabolic Panel:  Recent Labs Lab 01/10/17 0443 01/11/17 0343 01/12/17 0410 01/12/17 0748 01/12/17 1624 01/13/17 0530 01/14/17 0633  NA 131* 130* 135 137 138 140 139  K 3.6 3.7 4.0 4.1 3.8 3.9 3.9  CL 103 102 109 110 113* 112* 113*  CO2 23 20* 19* 18* 18* 20* 20*  GLUCOSE 104* 96 90 95 80 112* 90  BUN 23* 23* 41* 44* 45* 41* 33*  CREATININE 0.82 1.06 2.71* 2.84* 2.56* 1.77* 1.49*  CALCIUM 9.0 9.1 9.0 9.1 8.9 9.3 9.3  MG 1.4* 1.9 2.6*  --   --  1.7 1.4*  PHOS 4.4 6.0* 6.9*  --   --  6.0* 5.1*   Liver Function Tests:  Recent Labs Lab 01/10/17 0443 01/11/17 0343 01/12/17 0410 01/13/17 0530 01/14/17 0633  ALBUMIN 2.6* 2.7* 2.5* 2.4* 2.5*    Coagulation Profile: No results for input(s): INR, PROTIME in the last 168 hours. CBG:  Recent Labs Lab 01/10/17 2314 01/11/17 0408 01/11/17 0845 01/11/17 1306 01/11/17 1634  GLUCAP 129* 98 100* 103* 115*    Recent Results (from the past 240 hour(s))  CSF culture with Stat gram stain     Status: None   Collection Time: 01/05/17  9:00 AM  Result Value Ref Range Status   Specimen Description CSF  Final   Special Requests Immunocompromised  Final   Gram Stain   Final    WBC PRESENT, PREDOMINANTLY MONONUCLEAR YEAST CRITICAL RESULT CALLED TO, READ BACK BY AND VERIFIED WITH: Barnett Abu, RN AT 1112 ON 01/05/17 BY C. JESSUP, MLT. CYTOSPIN SMEAR    Culture NO GROWTH 3 DAYS  Final   Report Status 01/09/2017 FINAL  Final         Radiology Studies: No results found.      Scheduled Meds: . amantadine  100 mg Oral BID WC  . azithromycin  1,200 mg Oral Weekly  . flucytosine  1,500 mg Oral Q6H  . heparin subcutaneous  5,000 Units Subcutaneous Q8H  . magnesium oxide  400 mg Oral BID  . mouth rinse  15 mL  Mouth Rinse BID  . potassium chloride  20 mEq Oral Once  . potassium chloride  40 mEq Oral Once  . sodium chloride flush  3 mL Intravenous Q12H   Continuous Infusions: . sodium chloride 50 mL/hr at 01/13/17 1634  . sodium chloride Stopped (01/13/17 1635)   And  . sodium chloride Stopped (01/14/17 1303)   And  . amphotericin  B  Liposome (AMBISOME) ADULT IV Stopped (01/13/17 1503)  . magnesium sulfate 1 - 4 g bolus IVPB       LOS: 24 days     Beaumont Austad, MD, FACP, FHM. [facility] Pager [phone]  If 7PM-7AM, please contact night-coverage [email or web address] Password [facility] 01/14/2017, 3:26 PM

## 2017-01-14 NOTE — Progress Notes (Signed)
Occupational Therapy Treatment Patient Details Name: William Pena MRN: [id number] DOB: 03-Sep-1985 Today's Date: 01/14/2017    History of present illness Patient is a 31 y/o male presents with sepsis secondary to perirectal abscess s/p I&D 5/23 and disseminated cryptococcal infection including meningitis, bacteremia, and likely pneumonia. Intubated 5/28-6/5. Pt found to have acute ischemic strokes. 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 Second MRI- extension of the diffusion restriction in the left basal ganglia and right basal ganglia. s/p Lumbar drain 5/30. PMH includes advanced hiv disease   OT comments  Pt seen per RN request re: bil soft elbow splint education. Educated Therapist, sports on splint management (proper placement, donning/doffing technique, care when soiled, skin checks at least 1x per shift, and pt can wear splints at all times). PROM bil UEs performed x10 each joint. Educated pts mother on PROM technique and positioning for bil UEs. D/c plan remains appropriate. Will continue to follow acutely.   Follow Up Recommendations  SNF    Equipment Recommendations  None recommended by OT    Recommendations for Other Services      Precautions / Restrictions Precautions Precautions: Fall Required Braces or Orthoses: Other Brace/Splint (bil soft elbow splints) Restrictions Weight Bearing Restrictions: No       Mobility Bed Mobility                  Transfers                      Balance                                           ADL either performed or assessed with clinical judgement   ADL                                         General ADL Comments: Pt recently transferred to new unit; RN requesting education re: bil soft elbow splints. RN also asking about obtaining new splints since current ones are soiled. Educated Therapist, sports on splint management: donning/doffing, skin  checks every shift, method for cleaning, proper placement, and pt can wear at all times.     Vision       Perception     Praxis      Cognition Arousal/Alertness: Awake/alert;Lethargic (intermittently falling asleep) Behavior During Therapy: Flat affect Overall Cognitive Status: Impaired/Different from baseline Area of Impairment: Attention;Following commands;Problem solving                   Current Attention Level: Focused   Following Commands: Follows one step commands inconsistently     Problem Solving: Slow processing;Decreased initiation;Requires verbal cues;Requires tactile cues General Comments: Inconsistently following one step commands, responds better to mother        Exercises Exercises: General Upper Extremity General Exercises - Upper Extremity Shoulder Flexion: PROM;Both;10 reps;Supine Shoulder Extension: PROM;Both;10 reps;Supine Elbow Flexion: PROM;Both;10 reps;Supine Elbow Extension: PROM;Both;10 reps;Supine Wrist Flexion: PROM;Both;10 reps;Supine Wrist Extension: PROM;Both;10 reps;Supine Digit Composite Flexion: PROM;Both;10 reps;Supine Composite Extension: PROM;Both;10 reps;Supine Other Exercises Other Exercises: Educated pts mother on PROM technique and positioning of bil UEs   Shoulder Instructions       General Comments      Pertinent Vitals/ Pain       Pain Assessment:  Faces Faces Pain Scale: No hurt  Home Living                                          Prior Functioning/Environment              Frequency  Min 3X/week        Progress Toward Goals  OT Goals(current goals can now be found in the care plan section)  Progress towards OT goals: Progressing toward goals  Acute Rehab OT Goals Patient Stated Goal: none stated by pt OT Goal Formulation: With patient  Plan Discharge plan remains appropriate    Co-evaluation                 AM-PAC PT "6 Clicks" Daily Activity     Outcome  Measure   Help from another person eating meals?: Total Help from another person taking care of personal grooming?: Total Help from another person toileting, which includes using toliet, bedpan, or urinal?: Total Help from another person bathing (including washing, rinsing, drying)?: Total Help from another person to put on and taking off regular upper body clothing?: Total Help from another person to put on and taking off regular lower body clothing?: Total 6 Click Score: 6    End of Session    OT Visit Diagnosis: Cognitive communication deficit (R41.841);Apraxia (R48.2);Muscle weakness (generalized) (M62.81) Symptoms and signs involving cognitive functions: Cerebral infarction Pain - Right/Left: Left Pain - part of body: Shoulder   Activity Tolerance Patient tolerated treatment well   Patient Left in bed;with call bell/phone within reach;with family/visitor present   Nurse Communication Other (comment) (splint education provided)        Time: 7903-8333 OT Time Calculation (min): 19 min  Charges: OT General Charges $OT Visit: 1 Procedure OT Treatments $Therapeutic Activity: 8-22 mins  Jettie Mannor A. Ulice Brilliant, M.S., OTR/L Pager: [place] 01/14/2017, 3:25 PM

## 2017-01-14 NOTE — Progress Notes (Signed)
Pharmacy Consult:  Electrolyte Replacement  58 yom with advanced HIV disease currently on treatment for disseminated cryptococcal disease with bacteremia and meningitis. Pharmacy consulted to manage electrolytes.  Removed ~1278mL of urine after placement of foley. Bactrim held by ID. SCr down to 1.4. To continue 1L of NS before and after Ambisome doses..  K 3.9, magnesium 1.4. Noted patient refused potassium supplementation yesterday.   Plan: KCl 3mEq po x1 and magnesium sulfate 4g IV x1 for repletion today Continue Mg 400mg  PO BID Continue to follow electrolytes, renal function, and ID plans closely   Dove Gresham D. Ronelle Michie, PharmD, BCPS Clinical Pharmacist Pager: [phone] Clinical Phone for 01/14/2017 until 3:30pm: [phone] If after 3:30pm, please call main pharmacy at [phone] 01/14/2017 12:36 PM

## 2017-01-15 DIAGNOSIS — B459 Cryptococcosis, unspecified: Secondary | ICD-10-CM

## 2017-01-15 LAB — RENAL FUNCTION PANEL
ALBUMIN: 2.5 g/dL — AB (ref 3.5–5.0)
ANION GAP: 6 (ref 5–15)
BUN: 25 mg/dL — AB (ref 6–20)
CO2: 19 mmol/L — ABNORMAL LOW (ref 22–32)
Calcium: 8.6 mg/dL — ABNORMAL LOW (ref 8.9–10.3)
Chloride: 112 mmol/L — ABNORMAL HIGH (ref 101–111)
Creatinine, Ser: 1.19 mg/dL (ref 0.61–1.24)
GFR calc Af Amer: >60 mL/min (ref >60)
Glucose, Bld: 91 mg/dL (ref 65–99)
PHOSPHORUS: 4.7 mg/dL — AB (ref 2.5–4.6)
POTASSIUM: 4.2 mmol/L (ref 3.5–5.1)
Sodium: 137 mmol/L (ref 135–145)

## 2017-01-15 LAB — CULTURE, FUNGUS WITHOUT SMEAR

## 2017-01-15 LAB — CBC
HEMATOCRIT: 22.4 % — AB (ref 39.0–52.0)
HEMOGLOBIN: 7.2 g/dL — AB (ref 13.0–17.0)
MCH: 27.6 pg (ref 26.0–34.0)
MCHC: 32.1 g/dL (ref 30.0–36.0)
MCV: 85.8 fL (ref 78.0–100.0)
Platelets: 111 10*3/uL — ABNORMAL LOW (ref 150–400)
RBC: 2.61 MIL/uL — AB (ref 4.22–5.81)
RDW: 17.4 % — AB (ref 11.5–15.5)
WBC: 5.4 10*3/uL (ref 4.0–10.5)

## 2017-01-15 LAB — MAGNESIUM: Magnesium: 1.4 mg/dL — ABNORMAL LOW (ref 1.7–2.4)

## 2017-01-15 MED ORDER — MAGNESIUM SULFATE 4 GM/100ML IV SOLN
4.0000 g | Freq: Once | INTRAVENOUS | Status: AC
  Administered 2017-01-15: 4 g via INTRAVENOUS
  Filled 2017-01-15: qty 100

## 2017-01-15 NOTE — Progress Notes (Signed)
Pharmacy Consult:  Electrolyte Replacement  30 yom with advanced HIV disease currently on treatment for disseminated cryptococcal disease with bacteremia and meningitis. Pharmacy consulted to manage electrolytes.  Removed ~1229mL of urine after placement of foley. Bactrim held by ID. SCr down to 1.19. To continue 1L of NS before and after Ambisome doses..  K 4.2, magnesium 1.4.   Plan: Magnesium sulfate 4g IV x1 for repletion today Continue Mag 400mg  PO BID Continue to follow electrolytes, renal function, and ID plans closely   Bryla Burek D. Nashya Garlington, PharmD, BCPS Clinical Pharmacist Pager: [phone] Clinical Phone for 01/15/2017 until 3:30pm: [phone] If after 3:30pm, please call main pharmacy at [phone] 01/15/2017 12:44 PM

## 2017-01-15 NOTE — Progress Notes (Signed)
Perirectal abscess wound cleaned and changed per order, moderate amount of bleeding noted.  Outer dressing re-changed and bleeding has stopped.  Will monitor for any further bleeding or issues.  P.J. [name] Mako, RN

## 2017-01-15 NOTE — Progress Notes (Signed)
PROGRESS NOTE    William Pena  QMG:[id number] DOB: [birth year] DOA: 12/20/2016 PCP: [name], MD    Brief Narrative:  31 year old male with HIV AIDS admitted May 22 for perirectal abscess. 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 May 28 developing worsening mentation and respiratory failure, required noninvasive mechanical ventilation. Required a CSF drain. Patient extubated June 5. LP drain was removed. Patient failed swallow evaluation, tube feeds started. Mentation still not at baseline. Transfer to stepdown on June 11 and to the medical ward June 13. Has developed worsening kidney function. Prolonged hospital stay.  Assessment & Plan:   Principal Problem:   Perirectal abscess Active Problems:   Chronic pain   AIDS (acquired immune deficiency syndrome) (HCC)   Hyponatremia   Normocytic anemia   Elevated LFTs   Pulmonary infiltrates   Hypomagnesemia   Encephalopathy acute   SIRS (systemic inflammatory response syndrome) (HCC)   Acute respiratory failure (HCC)   Rectal abscess   Cryptococcal meningoencephalitis (HCC)   Cryptococcosis (HCC)   Cavitary pneumonia   Diffuse lymphadenopathy   Drug rash   Elevated intracranial pressure   HIV disease (HCC)   Lymphadenopathy of head and neck   HIV (human immunodeficiency virus infection) ([place])   Meningitis   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 Disseminated cryptococcal infection, with meningoencephalitis. Will continue antibiotic therapy with amphotericin and fucytosine. Plan for 28 days of IV therapy before changing to oral antibiotic regimen with fluconazole, follow ID recommendations.   2. HIV AIDS. Will continue antifungal therapy as above, plan to follow as outpatient to start ARV agents. Continue azithromycin for MAC px.   3. Perirectal abscess. Resolved, continue pain control, will add dulcolax for bowel regimen.   4, AKI due to urinary  retention. Renal function with cr down to 1,19, will keep foley catheter in place, will follow renal panel in am, avoid hypotension or nephrotoxic medications. Continue gentle hydration with saline at 50 ml per hour.    DVT prophylaxis:  Code Status:  Family Communication:  Disposition Plan:    Consultants:     Procedures:    Antimicrobials:     Subjective: Patient not communicative. All information from nursing, patient has been bleeding from sacral wound, no nausea or vomiting, very poor oral intake. Persistent contractures on upper extremities.   Objective: Vitals:   01/14/17 0603 01/14/17 1416 01/14/17 2210 01/15/17 0552  BP: 134/83 134/83 (!) 144/71 (!) 148/82  Pulse: 94 98 90 94  Resp: 17 17 18 20   Temp: 98.5 F (36.9 C) 98.9 F (37.2 C) 99 F (37.2 C) 98.8 F (37.1 C)  TempSrc: Oral     SpO2: 100% 100% 100% 100%  Weight:      Height:        Intake/Output Summary (Last 24 hours) at 01/15/17 1235 Last data filed at 01/15/17 0933  Gross per 24 hour  Intake         11121.34 ml  Output             2575 ml  Net          8546.34 ml   Filed Weights   01/10/17 0900 01/11/17 0640 01/11/17 0905  Weight: 63 kg (138 lb 14.2 oz) 60 kg (132 lb 4.4 oz) 62 kg (136 lb 11 oz)    Examination:  General exam: deconditioned and ill looking appearing E ENT: mild pallor, no icterus, oral  Mucosa moist.  Respiratory system: Clear to auscultation. Respiratory effort normal. Decreased inspiratory effort, no no wheezing, tales or rhonchi.  Cardiovascular system: S1 & S2 heard, RRR. No JVD, murmurs, rubs, gallops or clicks. No pedal edema. Gastrointestinal system: Abdomen is nondistended, soft and nontender. No organomegaly or masses felt. Normal bowel sounds heard. Central nervous system: Alert and oriented. No focal neurological deficits. Extremities: Symmetric 5 x 5 power. Skin: wound at the sacrum with dressing in place.      Data Reviewed: I have personally reviewed  following labs and imaging studies  CBC:  Recent Labs Lab 01/09/17 0328 01/12/17 1030 01/13/17 0530 01/14/17 0633 01/15/17 0659  WBC 4.5 6.9 5.6 5.1 5.4  NEUTROABS 3.2  --   --   --   --   HGB 9.0* 8.5* 7.8* 7.7* 7.2*  HCT 27.3* 26.5* 24.3* 24.3* 22.4*  MCV 83.7 84.4 84.4 86.5 85.8  PLT 179 139* 126* 125* 376*   Basic Metabolic Panel:  Recent Labs Lab 01/11/17 0343 01/12/17 0410 01/12/17 0748 01/12/17 1624 01/13/17 0530 01/14/17 0633 01/15/17 0659  NA 130* 135 137 138 140 139 137  K 3.7 4.0 4.1 3.8 3.9 3.9 4.2  CL 102 109 110 113* 112* 113* 112*  CO2 20* 19* 18* 18* 20* 20* 19*  GLUCOSE 96 90 95 80 112* 90 91  BUN 23* 41* 44* 45* 41* 33* 25*  CREATININE 1.06 2.71* 2.84* 2.56* 1.77* 1.49* 1.19  CALCIUM 9.1 9.0 9.1 8.9 9.3 9.3 8.6*  MG 1.9 2.6*  --   --  1.7 1.4* 1.4*  PHOS 6.0* 6.9*  --   --  6.0* 5.1* 4.7*   GFR: Estimated Creatinine Clearance: 78.9 mL/min (by C-G formula based on SCr of 1.19 mg/dL). Liver Function Tests:  Recent Labs Lab 01/11/17 0343 01/12/17 0410 01/13/17 0530 01/14/17 0633 01/15/17 0659  ALBUMIN 2.7* 2.5* 2.4* 2.5* 2.5*   No results for input(s): LIPASE, AMYLASE in the last 168 hours. No results for input(s): AMMONIA in the last 168 hours. Coagulation Profile: No results for input(s): INR, PROTIME in the last 168 hours. Cardiac Enzymes: No results for input(s): CKTOTAL, CKMB, CKMBINDEX, TROPONINI in the last 168 hours. BNP (last 3 results) No results for input(s): PROBNP in the last 8760 hours. HbA1C: No results for input(s): HGBA1C in the last 72 hours. CBG:  Recent Labs Lab 01/10/17 2314 01/11/17 0408 01/11/17 0845 01/11/17 1306 01/11/17 1634  GLUCAP 129* 98 100* 103* 115*   Lipid Profile: No results for input(s): CHOL, HDL, LDLCALC, TRIG, CHOLHDL, LDLDIRECT in the last 72 hours. Thyroid Function Tests: No results for input(s): TSH, T4TOTAL, FREET4, T3FREE, THYROIDAB in the last 72 hours. Anemia Panel: No results  for input(s): VITAMINB12, FOLATE, FERRITIN, TIBC, IRON, RETICCTPCT in the last 72 hours. Sepsis Labs: No results for input(s): PROCALCITON, LATICACIDVEN in the last 168 hours.  No results found for this or any previous visit (from the past 240 hour(s)).       Radiology Studies: No results found.      Scheduled Meds: . amantadine  100 mg Oral BID WC  . azithromycin  1,200 mg Oral Weekly  . feeding supplement  1 Container Oral TID BM  . flucytosine  1,500 mg Oral Q6H  . heparin subcutaneous  5,000 Units Subcutaneous Q8H  . magnesium oxide  400 mg Oral BID  . mouth rinse  15 mL Mouth Rinse BID  . potassium chloride  20 mEq Oral Once  . sodium chloride flush  3 mL Intravenous Q12H   Continuous Infusions: . sodium chloride 50 mL/hr at 01/13/17 1634  . sodium chloride Stopped (01/15/17 0253)   And  . sodium chloride 1,000 mL (01/14/17 1600)   And  . amphotericin  B  Liposome (AMBISOME) ADULT IV Stopped (01/14/17 2234)     LOS: 25 days       Copper Basnett Gerome Apley, MD [facility] Pager [phone]  If 7PM-7AM, please contact night-coverage [email or web address] Password [facility] 01/15/2017, 12:35 PM

## 2017-01-15 NOTE — Progress Notes (Signed)
    [place] for Infectious Disease   Reason for visit: Follow up on Crytococcal meningitis  Interval History: creat 1.19, now wnl; complains of his gluteal pain at the site of the abscess after changing and cleaning it today; no fever, wbc remains wnl.   Day 25 amphotericin and flucytosine OI prophylaxis with azithromycin Bactrim held with increase in his creat  Physical Exam: Constitutional:  Vitals:   01/15/17 1246 01/15/17 1313  BP: 118/73   Pulse: 92   Resp: 19   Temp: 99.3 F (37.4 C) 99.2 F (37.3 C)   patient appears in NAD, slow to respond Eyes: anicteric HENT: no thrush Respiratory: Normal respiratory effort; CTA B Cardiovascular: RRR   Review of Systems: Constitutional: negative for fevers and chills Gastrointestinal: negative for diarrhea  Lab Results  Component Value Date   WBC 5.4 01/15/2017   HGB 7.2 (L) 01/15/2017   HCT 22.4 (L) 01/15/2017   MCV 85.8 01/15/2017   PLT 111 (L) 01/15/2017    Lab Results  Component Value Date   CREATININE 1.19 01/15/2017   BUN 25 (H) 01/15/2017   NA 137 01/15/2017   K 4.2 01/15/2017   CL 112 (H) 01/15/2017   CO2 19 (L) 01/15/2017    Lab Results  Component Value Date   ALT 42 12/28/2016   AST 30 12/28/2016   ALKPHOS 135 (H) 12/28/2016     Microbiology: No results found for this or any previous visit (from the past 240 hour(s)).  Impression/Plan:  1. Cryptococcal meningitis - most recent LP with negative culture, only yeast on gram stain.  He continues on ampho and flucytosine.  He should continue with this for 28 days here then can transition to oral fluconazole if he is stable.    2.  Medication monitoring - creat back to normal.  Likely was partially from obstruction and from missed bolus.  Will continue to monitor.  3.  Perirectal abscess - cleaned today.  No new concerns.  4.  HIV - holding on ARVs for now due to #1.    5.  OI prophylaxis - on azithromycin   Bactrim held with increased creat.   If stable tomorrow, can restart.    Dr. Baxter Flattery will be on tomorrow

## 2017-01-16 DIAGNOSIS — L8990 Pressure ulcer of unspecified site, unspecified stage: Secondary | ICD-10-CM | POA: Insufficient documentation

## 2017-01-16 LAB — CBC WITH DIFFERENTIAL/PLATELET
Basophils Absolute: 0 10*3/uL (ref 0.0–0.1)
Basophils Relative: 0 %
EOS PCT: 2 %
Eosinophils Absolute: 0.1 10*3/uL (ref 0.0–0.7)
HCT: 22.2 % — ABNORMAL LOW (ref 39.0–52.0)
Hemoglobin: 7 g/dL — ABNORMAL LOW (ref 13.0–17.0)
LYMPHS PCT: 16 %
Lymphs Abs: 0.8 10*3/uL (ref 0.7–4.0)
MCH: 26.6 pg (ref 26.0–34.0)
MCHC: 31.5 g/dL (ref 30.0–36.0)
MCV: 84.4 fL (ref 78.0–100.0)
MONO ABS: 0.2 10*3/uL (ref 0.1–1.0)
MONOS PCT: 4 %
Neutro Abs: 3.8 10*3/uL (ref 1.7–7.7)
Neutrophils Relative %: 78 %
PLATELETS: 105 10*3/uL — AB (ref 150–400)
RBC: 2.63 MIL/uL — AB (ref 4.22–5.81)
RDW: 16.9 % — AB (ref 11.5–15.5)
WBC: 4.9 10*3/uL (ref 4.0–10.5)

## 2017-01-16 LAB — BASIC METABOLIC PANEL
Anion gap: 6 (ref 5–15)
BUN: 19 mg/dL (ref 6–20)
CO2: 19 mmol/L — ABNORMAL LOW (ref 22–32)
Calcium: 8.4 mg/dL — ABNORMAL LOW (ref 8.9–10.3)
Chloride: 109 mmol/L (ref 101–111)
Creatinine, Ser: 1.02 mg/dL (ref 0.61–1.24)
GFR calc Af Amer: >60 mL/min (ref >60)
GLUCOSE: 93 mg/dL (ref 65–99)
POTASSIUM: 4 mmol/L (ref 3.5–5.1)
Sodium: 134 mmol/L — ABNORMAL LOW (ref 135–145)

## 2017-01-16 LAB — RENAL FUNCTION PANEL
Albumin: 2.2 g/dL — ABNORMAL LOW (ref 3.5–5.0)
Anion gap: 6 (ref 5–15)
BUN: 19 mg/dL (ref 6–20)
CHLORIDE: 109 mmol/L (ref 101–111)
CO2: 18 mmol/L — AB (ref 22–32)
Calcium: 8.4 mg/dL — ABNORMAL LOW (ref 8.9–10.3)
Creatinine, Ser: 1.03 mg/dL (ref 0.61–1.24)
GFR calc Af Amer: >60 mL/min (ref >60)
GFR calc non Af Amer: >60 mL/min (ref >60)
GLUCOSE: 92 mg/dL (ref 65–99)
POTASSIUM: 4 mmol/L (ref 3.5–5.1)
Phosphorus: 4.7 mg/dL — ABNORMAL HIGH (ref 2.5–4.6)
Sodium: 133 mmol/L — ABNORMAL LOW (ref 135–145)

## 2017-01-16 LAB — PREPARE RBC (CROSSMATCH)

## 2017-01-16 LAB — MAGNESIUM: Magnesium: 1.5 mg/dL — ABNORMAL LOW (ref 1.7–2.4)

## 2017-01-16 LAB — CULTURE, FUNGUS WITHOUT SMEAR

## 2017-01-16 MED ORDER — MAGNESIUM SULFATE 50 % IJ SOLN
3.0000 g | Freq: Four times a day (QID) | INTRAVENOUS | Status: AC
  Administered 2017-01-16 (×2): 3 g via INTRAVENOUS
  Filled 2017-01-16 (×2): qty 6

## 2017-01-16 MED ORDER — SODIUM CHLORIDE 0.9 % IV SOLN
Freq: Once | INTRAVENOUS | Status: AC
  Administered 2017-01-17: 01:00:00 via INTRAVENOUS

## 2017-01-16 MED ORDER — ENSURE ENLIVE PO LIQD
237.0000 mL | Freq: Three times a day (TID) | ORAL | Status: DC
  Administered 2017-01-16 – 2017-01-27 (×29): 237 mL via ORAL

## 2017-01-16 MED ORDER — SULFAMETHOXAZOLE-TRIMETHOPRIM 800-160 MG PO TABS
1.0000 | ORAL_TABLET | Freq: Every day | ORAL | Status: DC
  Administered 2017-01-16 – 2017-02-06 (×22): 1 via ORAL
  Filled 2017-01-16 (×22): qty 1

## 2017-01-16 NOTE — Progress Notes (Signed)
Calorie Count Note  48 hour calorie count ordered.  Diet: Dysphagia 1 with honey thick liquids Supplements: Boost Breeze po TID, each supplement provides 250 kcal and 9 grams of protein  Calorie count data incomplete. Used meal completion percentages and observations of meal trays to estimate calorie count. Due to limited data, suspect pt may be eating more than is documented.   Spoke with pt mother, who was feeding pt at time of visit. Pt appeared more alert than during last visit. Pt was able to make eye contact with this RD and answer simple questions. Pt mom has also been bringing in outside food, such as commercial baby food, to assist pt with eating. Per mother, pt has grown tired of meal offerings on pureed diet. She has also been feeding pt when present in hospital.   Noted pt was ordered [facility]; will change to Ensure due to increased nutrient density and better ability to thicken [facility] generally does not thicken well with commercial thickeners).  6/14 Breakfast: 156 kcals, 7 grams protein Lunch: 0% Dinner: 80 kcals, 0 grams protein Supplements: 0%  Total intake: 236 kcal (13% of minimum estimated needs)  7 grams protein (8% of minimum estimated needs)  6/15 Breakfast: 221 kcals, 9 grams protein Lunch: 0% completion Dinner: no data available Supplements: n/a  Total intake: 221 kcal (12% of minimum estimated needs)  9 grams protein (10% of minimum estimated needs)  6/16 Breakfast: 302 kcals, 13 grams protein Lunch: 43 kcals, 2 grams protein Dinner: no data available Supplements: n/a  Total intake: 345 kcals kcal (19% of minimum estimated needs)  15 grams protein (17% of minimum estimated needs)  6/17 Breakfast: 130 kcals, 5 grams protein Lunch: no data available Dinner: no data available Supplements: n/a  Total intake: 130 kcal (7% of minimum estimated needs)  5 grams protein (6% of minimum estimated needs)  Average Total intake: 233 kcal  (13% of minimum estimated needs)  9 grams protein (9% of minimum estimated needs)  Nutrition Dx: Increased nutrient needsrelated to wound healingas evidenced by estimated needs; ongoing  Goal: Patient will meet greater than or equal to 90% of their needs; Progressing  Intervention:   -D/c calorie count -Snacks TID -D/c Boost Breeze po TID, each supplement provides 250 kcal and 9 grams of protein -Ensure Enlive po TID, each supplement provides 350 kcal and 20 grams of protein -If prolonged poor PO intake persists, consider initiation of nocturnal feedings. Recommend: Jevity 1.2 @ 70 ml/hr via cortrak tube over 12 hour period. Regimen to provides 1008 kcals, 47 grams protein, and 678 ml free water daily (56% of minimum estimated kcal needs, 52% of minimum estimated protein needs).    Riya Huxford A. Jimmye Norman, RD, LDN, CDE Pager: [phone] After hours Pager: [phone]

## 2017-01-16 NOTE — Progress Notes (Signed)
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 Pharmacy consulted to manage electrolytes.  Bactrim held by ID due to rise in SCr. Renal function improving, SCr down to 1.03. To continue 1L of NS before and after Ambisome doses.  K 4, magnesium 1.5 after 4 g replacement yesterday.   Plan: Magnesium sulfate 3g IV q6h for 2 doses for repletion today Continue Mag 400mg  PO BID Continue to follow electrolytes, renal function, and ID plans closely Resume Bactrim?   Renold Genta, PharmD, BCPS Clinical Pharmacist Phone for today - [place] - [phone] 01/16/2017 2:45 PM

## 2017-01-16 NOTE — Progress Notes (Signed)
PT Cancellation Note  Patient Details Name: William Pena MRN: [id number] DOB: 1986-04-16   Cancelled Treatment:    Reason Eval/Treat Not Completed: Medical issues which prohibited therapy.  Will await medical management of hgb and then can proceed with treatment.   Ramond Dial 01/16/2017, 10:18 AM  \ Mee Hives, PT MS Acute Rehab Dept. Number: [place]

## 2017-01-16 NOTE — Progress Notes (Signed)
PROGRESS NOTE   William Pena  ZOX:[id number]    DOB: 1985-09-09    DOA: 12/20/2016  PCP: [name], MD   I have briefly reviewed patients previous medical records in [place].  Brief Narrative:  31 year old male with a PMH of HIV/AIDS (last CD4 on 12/21/16:21), just started back on ART 2 months PTA, however he was incarcerated and reported that he was not getting his medications, admitted on 5/22 for perirectal abscess. During admission he was also noted to have headache, fever, chills, night sweats, neck pain and lymphadenopathy. ID was consulted and recommended imaging of chest, LP and cultures be sent from perirectal I&D. Also his serum cryptococcal antigen titer was elevated raising the possibility of disseminated cryptococcal infection including lung involvement. Pulmonology was consulted by ID for bronchoscopy and washings in an effort to help determine if his CT changes reflect TB or possible pulmonary cryptococcal disease. Initially unable to perform bronchoscopy due to hypoxia. 5/28 patient had altered LOC, became very lethargic/obtunded requiring CT and LP, complicated by acute respiratory failure due to altered mental status that required mechanical ventilation. He had a lumbar CSF drain to depression arise. Now extubated, drain removed. Failed swallowing evaluation and has Cor trak forte tube feeds. Mental status slightly better but waxing and waning. ID, neurology, neurosurgery and CCM consulted during hospital course. Care transferred to stepdown and [place] on 01/09/17. MS slowly improving. Cor Trak discontinued 6/12 and diet initiated. Transferred to medical bed 6/13.Acute kidney injury on 6/14 (creatinine 2.8), likely due to acute urinary retention & Amphotericin> resolved. Antimicrobials per ID  Assessment & Plan:   Principal Problem:   Perirectal abscess Active Problems:   Chronic pain   AIDS (acquired immune deficiency syndrome) (HCC)   Hyponatremia   Normocytic  anemia   Elevated LFTs   Pulmonary infiltrates   Hypomagnesemia   Encephalopathy acute   SIRS (systemic inflammatory response syndrome) (HCC)   Acute respiratory failure (HCC)   Rectal abscess   Cryptococcal meningoencephalitis (HCC)   Cryptococcosis (HCC)   Cavitary pneumonia   Diffuse lymphadenopathy   Drug rash   Elevated intracranial pressure   HIV disease (HCC)   Lymphadenopathy of head and neck   HIV (human immunodeficiency virus infection) ([place])   Meningitis   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 ID following. Lumbar drain removed 6/10. Repeat CSF culture negative and final. As per ID input 6/18, will finish out 28 days of amphotericin and flucytosine with plans to switch to maintenance fluconazole 400 daily. ID recommends repeating LP (please consult neurology or IR on 6/19). Resume Bactrim prophylaxis. 2. Acute urinary retention: Foley catheter had been removed but had to be reinserted on 6/14 yielding 1.2 L right away. Unclear etiology. Continue Foley catheter.? Trial of Flomax. 3. Acute kidney injury: Creatinine went up from 1.06 > 2.84. Secondary to Acute Urinary Retention and amphotericin. Acute kidney injury resolved. 4. Acute hypoxic respiratory failure secondary to cryptococcal pneumonia: Extubated 6/5. Resolved. 5. Acute encephalopathy: Multifactorial >cryptococcal meningitis, acute strokes, metabolic derangements, medication effect and respiratory failure. Has slowly improved but not sure if he'll ever get back to previous baseline. 6. Acute ischemic stroke: Due to small vessel involvement from his cryptococcal infection. As per neurology, continue with antifungal treatment and no role for antiplatelet agents or statins in this setting.  7. HIV/AIDS: CD4 count  this admission 21 and high viral load >1 million. Infectious disease following. Continue prophylaxis with Bactrim and azithromycin. ART not initiated due to high risk for IRIS 8. Perirectal abscess: Status post I&D. Twice a day dressing changes. WOC following. 9. Genital herpes: Completed valacyclovir. 10. Hypomagnesemia: Replace & follow as needed. Pharmacy managing. 11. Hyponatremia/? SIADH: Stable. 12. Diarrhea: Stool culture negative. Resolved. 13. Anemia of chronic disease: Hb slowly dropping> no bleeding. ? D/T Flucytosine. Hemoglobin down to 7 g on 6/18. Transfuse 1 unit PRBC. I was unable to reach patient's mother to discuss or consent. Discussed with RN to try to consent overnight. 14. Dysphagia: Cor Trak removed a few days ago. Oral intake has gradually improved.  15. Elevated blood pressure: When necessary IV hydralazine and monitor. 16. Thrombocytopenia: Unclear etiology.? Medication effect/Flucytosine. Stable. No bleeding reported. Stable. 17. Facial swelling: I did not notice this but ID did later in the day.? Related to hypoalbuminemia and positioning and if worsens repeat noncontrasted head CT.   DVT prophylaxis: Subcutaneous heparin Code Status: Full Family Communication: Unable to reach mother via phone. Disposition: Transferred from stepdown unit to telemetry on 6/13. DC to SNF when improved> Rehab did not feel appropriate for CIR.   Consultants:  CCM-signed off Infectious disease Neurology Neurosurgery   Procedures:  OETT 8.0 5/28 - 6/5 OGT 5/28 - 6/5 Lumbar Drain 5/28 >>DC'ed 6/10 Foley 5/28 >> 6/12, 6/14 > PIV  Cor trak> discontinued 6/12   Antimicrobials:  Triumeq 5/23 - 5/24 Flagyl 5/23 - 5/24 Vancomycin 5/23 - 5/24 Doxycycline 5/24 - 5/25 Rocephin 5/23 - 5/30 Augmentin 5/24 - 5/25 Bactrim 5/23 >> Flucytosine 5/24 >> Valtrex 5/24 >> 6/3  Azithromycin qweek 5/24 >> Ambisome 5/24 >>   MICROBIOLOGY: MRSA PCR 5/23:  Negative  Blood Cultures x2  5/22:  2/2 Bottles Positive Cryptococcus neoformans  Blood Cultures x2 5/23:  2/2 Bottles Positive Cryptococcus neoformans  Wound Culture 5/23:  Multiple organisms present CSF Culture 5/24:  Abundant Cryptococcus neoformans  Quantiferon TB 5/26:  Negative  CSF Culture 5/27:  Cryptococcus neoformans  BAL RML 5/28:  Cryptococcus neoformans / AFB smear negative & culture pending Tracheal Aspirate AFB 5/30 >>> Smear negative / Ctx pending Blood Cultures x2 5/30:  Negative  Stool Culture 5/31:  Negative  Stool Gastrointestinal Panel 5/31:  Negative Stool C diff 5/31:  Negative  CSF Culture 6/1 >> Encapsulated yeast seen   Subjective: Patient reported some pain behind the eyes. Denies any other complaints. As per nursing tech, 8 50-75 percent of breakfast this morning.  ROS: Unable due to mental status changes.  Objective:  Vitals:   01/15/17 1313 01/15/17 2224 01/16/17 0617 01/16/17 1253  BP:  133/73 134/73 119/74  Pulse:  89 86 88  Resp:  _0 Temp: 99.2 F (37.3 C) 98.9 F (37.2 C) 98.3 F (36.8 C) 98.5 F (36.9 C)  TempSrc: Oral Oral    SpO2:  100% 100% 100%  Weight:      Height:        Examination:  General exam: Pleasant young male, well built and nourished, lying comfortably propped up in bed. Does not appear in any distress. Respiratory system: Clear to auscultation without increased work of breathing.Stable without change Cardiovascular system: S1 and S2 heard, RRR. No JVD, murmurs or pedal edema.  Gastrointestinal system: Nondistended, soft and nontender. No organomegaly or masses appreciated. Normal bowel sounds present. Foley+. Stable without change Central nervous system: Mental status as stated  above and improving. No focal neurological deficits.  Extremities: less rigidity of upper extremities and moves to command. No focal deficits.  Skin: Extensive tattooing of his body. Psychiatry: Judgement and insight impaired. Mood & affect : Flat.    Data  Reviewed: I have personally reviewed following labs and imaging studies  CBC:  Recent Labs Lab 01/12/17 1030 01/13/17 0530 01/14/17 0633 01/15/17 0659 01/16/17 0640  WBC 6.9 5.6 5.1 5.4 4.9  NEUTROABS  --   --   --   --  3.8  HGB 8.5* 7.8* 7.7* 7.2* 7.0*  HCT 26.5* 24.3* 24.3* 22.4* 22.2*  MCV 84.4 84.4 86.5 85.8 84.4  PLT 139* 126* 125* 111* 196*   Basic Metabolic Panel:  Recent Labs Lab 01/12/17 0410  01/12/17 1624 01/13/17 0530 01/14/17 0633 01/15/17 0659 01/16/17 0640  NA 135  < > 138 140 139 137 134*  133*  K 4.0  < > 3.8 3.9 3.9 4.2 4.0  4.0  CL 109  < > 113* 112* 113* 112* 109  109  CO2 19*  < > 18* 20* 20* 19* 19*  18*  GLUCOSE 90  < > 80 112* 90 91 93  92  BUN 41*  < > 45* 41* 33* 25* 19  19  CREATININE 2.71*  < > 2.56* 1.77* 1.49* 1.19 1.02  1.03  CALCIUM 9.0  < > 8.9 9.3 9.3 8.6* 8.4*  8.4*  MG 2.6*  --   --  1.7 1.4* 1.4* 1.5*  PHOS 6.9*  --   --  6.0* 5.1* 4.7* 4.7*  < > = values in this interval not displayed. Liver Function Tests:  Recent Labs Lab 01/12/17 0410 01/13/17 0530 01/14/17 2229 01/15/17 0659 01/16/17 0640  ALBUMIN 2.5* 2.4* 2.5* 2.5* 2.2*   Coagulation Profile: No results for input(s): INR, PROTIME in the last 168 hours. CBG:  Recent Labs Lab 01/10/17 2314 01/11/17 0408 01/11/17 0845 01/11/17 1306 01/11/17 1634  GLUCAP 129* 98 100* 103* 115*    No results found for this or any previous visit (from the past 240 hour(s)).       Radiology Studies: No results found.      Scheduled Meds: . amantadine  100 mg Oral BID WC  . azithromycin  1,200 mg Oral Weekly  . feeding supplement (ENSURE ENLIVE)  237 mL Oral TID BM  . flucytosine  1,500 mg Oral Q6H  . heparin subcutaneous  5,000 Units Subcutaneous Q8H  . magnesium oxide  400 mg Oral BID  . mouth rinse  15 mL Mouth Rinse BID  . sodium chloride flush  3 mL Intravenous Q12H   Continuous Infusions: . sodium chloride 50 mL/hr at 01/16/17 1951  . sodium  chloride Stopped (01/16/17 0050)   And  . sodium chloride Stopped (01/16/17 1841)   And  . amphotericin  B  Liposome (AMBISOME) ADULT IV 240 mg (01/16/17 1951)  . magnesium sulfate 1 - 4 g bolus IVPB Stopped (01/16/17 1827)     LOS: 84 days     Meryle Pugmire, MD, FACP, FHM. [facility] Pager [phone]  If 7PM-7AM, please contact night-coverage [email or web address] Password [email or web address] 01/16/2017, 8:12 PM

## 2017-01-16 NOTE — Progress Notes (Signed)
[facility] for Infectious Disease    Date of Admission:  12/20/2016   Total days of antibiotics 26        Day 26 ampho/flucytosine        Day 22 oi proph   ID: William Pena is a 31 y.o. male with advanced hiv disease, disseminated CM Principal Problem:   Perirectal abscess Active Problems:   Chronic pain   AIDS (acquired immune deficiency syndrome) (HCC)   Hyponatremia   Normocytic anemia   Elevated LFTs   Pulmonary infiltrates   Hypomagnesemia   Encephalopathy acute   SIRS (systemic inflammatory response syndrome) (HCC)   Acute respiratory failure (HCC)   Rectal abscess   Cryptococcal meningoencephalitis (HCC)   Cryptococcosis (HCC)   Cavitary pneumonia   Diffuse lymphadenopathy   Drug rash   Elevated intracranial pressure   HIV disease (HCC)   Lymphadenopathy of head and neck   HIV (human immunodeficiency virus infection) ([place])   Meningitis   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 amantadine  100 mg Oral BID WC  . azithromycin  1,200 mg Oral Weekly  . feeding supplement (ENSURE ENLIVE)  237 mL Oral TID BM  . flucytosine  1,500 mg Oral Q6H  . heparin subcutaneous  5,000 Units Subcutaneous Q8H  . magnesium oxide  400 mg Oral BID  . mouth rinse  15 mL Mouth Rinse BID  . sodium chloride flush  3 mL Intravenous Q12H    Objective: Vital signs in last 24 hours: Temp:  [98.3 F (36.8 C)-98.9 F (37.2 C)] 98.5 F (36.9 C) (06/18 1253) Pulse Rate:  [86-89] 88 (06/18 1253) Resp:  [18] 18 (06/18 1253) BP: (119-134)/(73-74) 119/74 (06/18 1253) SpO2:  [100 %] 100 % (06/18 1253) Physical Exam  Constitutional: [name] oriented to person, place, and time. He appears well-developed and well-nourished. No distress.  HENT: right side off face has noticeable  swollen eyelid, but no injected conjunctiva Neck: mild nuchal rigidity Mouth/Throat: Oropharynx is clear and moist. No oropharyngeal exudate.  Cardiovascular: Normal rate, regular rhythm and normal heart sounds. Exam reveals no gallop and no friction rub.  No murmur heard.  Pulmonary/Chest: Effort normal and breath sounds normal. No respiratory distress. He has no wheezes.  Abdominal: Soft. Bowel sounds are normal. He exhibits no distension. There is no tenderness.  Lymphadenopathy:  He has no cervical adenopathy.  Neurological: [name] alert and oriented to person, follows simple commands Skin: Skin is warm and dry. No rash noted. No erythema.      Lab Results  Recent Labs  01/15/17 0659 01/16/17 0640  WBC 5.4 4.9  HGB 7.2* 7.0*  HCT 22.4* 22.2*  NA 137 134*  133*  K 4.2 4.0  4.0  CL 112* 109  109  CO2 19* 19*  18*  BUN 25* 19  19  CREATININE 1.19 1.02  1.03   Liver Panel  Recent Labs  01/15/17 0659 01/16/17 0640  ALBUMIN 2.5* 2.2*    Microbiology: 6/7 csf no growth, but yeast on gram stain Studies/Results: No results found.   Assessment/Plan: Disseminated cryptococcal meningitis = will finish out 28 days of L-ampho and flucytosine with plans to switch to maintenance fluconazole 400mg  daily. Please have repeat LP done  Facial swelling =  unsure if related to hypoalbuminemia and positioning doubt it is CNS since it would be odd to have unilateral swelling. If worsens, would repeat NCHCT  Medication monitoring = kidney functiona back to normal. Replete electrolytes per pharmacy  oi proph = can restart bactrim daily plus weekly azithromycin  hiv disease= holding off initiation of ART due to high risk for IRIS  Baxter Flattery [facility] for Infectious Diseases Cell: [phone] Pager: [phone]  01/16/2017, 6:15 PM

## 2017-01-16 NOTE — Progress Notes (Signed)
Subjective: Patient is alert and oriented able to tell me that he is in the hospital. States that he is in no pain. Able to follow simple commands but only speaks with 1 word answers  Exam: Vitals:   01/15/17 2224 01/16/17 0617  BP: 133/73 134/73  Pulse: 89 86  Resp: 18 18  Temp: 98.9 F (37.2 C) 98.3 F (36.8 C)    HEENT-  Normocephalic, no lesions, without obvious abnormality.  Normal external eye and conjunctiva.  Normal TM's bilaterally.  Normal auditory canals and external ears. Normal external nose, mucus membranes and septum.  Normal pharynx.    Neuro:  CN: Pupils are equal and round. They are symmetrically reactive from 3-->2 mm. EOMI without nystagmus. Facial sensation is intact to light touch. Face is symmetric at rest with normal strength and mobility. Hearing is intact to conversational voice.  Motor: Patient continues to have increased tone in his bilateral upper extremities. He has decreased tone in his bilateral lower extremities. He does move all 4 extremities to noxious stimuli symmetrically. Sensation: Patient has sensation throughout DTRs:  he is hyperreflexive in both upper and lower extremities with 3+ throughout.     Pertinent Labs/Diagnostics: None  Etta Quill PA-C [facility] [phone]  Impression: Patient presented with cryptococcal meningoencephalitis twitch ID is following at this time. Patient's encephalopathy has gradually improved throughout hospital stay. At this time no further recommendations per neurology. Neurology will sign off thank you very much for this consultation       01/16/2017, 10:52 AM

## 2017-01-17 DIAGNOSIS — R4182 Altered mental status, unspecified: Secondary | ICD-10-CM

## 2017-01-17 LAB — RENAL FUNCTION PANEL
ALBUMIN: 2.2 g/dL — AB (ref 3.5–5.0)
ANION GAP: 5 (ref 5–15)
BUN: 15 mg/dL (ref 6–20)
CO2: 20 mmol/L — ABNORMAL LOW (ref 22–32)
Calcium: 8.2 mg/dL — ABNORMAL LOW (ref 8.9–10.3)
Chloride: 106 mmol/L (ref 101–111)
Creatinine, Ser: 0.89 mg/dL (ref 0.61–1.24)
Glucose, Bld: 92 mg/dL (ref 65–99)
PHOSPHORUS: 4.5 mg/dL (ref 2.5–4.6)
POTASSIUM: 3.7 mmol/L (ref 3.5–5.1)
Sodium: 131 mmol/L — ABNORMAL LOW (ref 135–145)

## 2017-01-17 LAB — CBC
HEMATOCRIT: 24.5 % — AB (ref 39.0–52.0)
HEMOGLOBIN: 8 g/dL — AB (ref 13.0–17.0)
MCH: 28.1 pg (ref 26.0–34.0)
MCHC: 32.7 g/dL (ref 30.0–36.0)
MCV: 86 fL (ref 78.0–100.0)
Platelets: 92 10*3/uL — ABNORMAL LOW (ref 150–400)
RBC: 2.85 MIL/uL — ABNORMAL LOW (ref 4.22–5.81)
RDW: 17.3 % — ABNORMAL HIGH (ref 11.5–15.5)
WBC: 5.5 10*3/uL (ref 4.0–10.5)

## 2017-01-17 LAB — MAGNESIUM: Magnesium: 1.7 mg/dL (ref 1.7–2.4)

## 2017-01-17 NOTE — Progress Notes (Signed)
  Speech Language Pathology Treatment: Dysphagia  Patient Details Name: William Pena MRN: [id number] DOB: [birth year] Today's Date: 01/17/2017 Time: 6553-7482 SLP Time Calculation (min) (ACUTE ONLY): 29 min  Assessment / Plan / Recommendation Clinical Impression  Pt continues with poor intake and delayed responses today.  Family (? Girlfriend) and son present during SLP session.   Assisted pt to consume soft fruit, pudding with medicine, ice chips, honey thick and nectar juice.  Mildly delayed but effective oral clearance/mastication noted.  Intermittent throat clearing immediately post=swallow of 10% of boluses and cough x1 - cues to cough/throat clear and re-swallow effective to clear. Did not test thin liquids due to increased concerns for aspiration with oral control deficits.  Ice chip consumption followed by pt wincing - ? dentition pain/sensitivity.  Pt today is following directions with delays and has strong volitional cough.  Voice remains soft but with max cues can increase volume.    Recommend advance diet to dys3/nectar with strict precautions.  Educated friends/family in room extensively to swallow precautions, need to observe laryngeal elevation to assure pt swallows before giving more.  Will follow up for skilled SLP treatment, diet tolerance, etc.    HPI HPI: Patient is a 31 y/o male presents with sepsis secondary to perirectal abscess s/p I&D 5/23 and disseminated cryptococcal infection including meningitis, bacteremia, and likely pneumonia. Intubated 5/28-6/5. 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 Second MRI- extension of the diffusion restriction in the left basal ganglia and right basal ganglia. s/p Lumbar drain 5/30. PMH includes advanced hiv disease      SLP Plan  Continue with current plan of care       Recommendations  Diet recommendations: Dysphagia 3 (mechanical soft);Nectar-thick liquid Liquids provided  via: Cup;Straw Medication Administration: Whole meds with puree (with pudding, crushed if large) Supervision: Staff to assist with self feeding;Full supervision/cueing for compensatory strategies Compensations: Minimize environmental distractions;Slow rate;Small sips/bites;Other (Comment) (intermittent throat clear/cough and reswallow if reflexively observed) Postural Changes and/or Swallow Maneuvers: Seated upright 90 degrees;Upright 30-60 min after meal                Oral Care Recommendations: Oral care BID Follow up Recommendations: 24 hour supervision/assistance SLP Visit Diagnosis: Dysphagia, oropharyngeal phase (R13.12) Plan: Continue with current plan of care       [place], [name] [facility] SLP [phone]

## 2017-01-17 NOTE — Progress Notes (Signed)
PROGRESS NOTE   William Pena  IRW:[id number]    DOB: 08-May-1986    DOA: 12/20/2016  PCP: [name], MD   I have briefly reviewed patients previous medical records in [place].  Brief Narrative:  31 year old male with a PMH of HIV/AIDS (last CD4 on 12/21/16:21), just started back on ART 2 months PTA, however he was incarcerated and reported that he was not getting his medications, admitted on 5/22 for perirectal abscess. During admission he was also noted to have headache, fever, chills, night sweats, neck pain and lymphadenopathy. ID was consulted and recommended imaging of chest, LP and cultures be sent from perirectal I&D. Also his serum cryptococcal antigen titer was elevated raising the possibility of disseminated cryptococcal infection including lung involvement. Pulmonology was consulted by ID for bronchoscopy and washings in an effort to help determine if his CT changes reflect TB or possible pulmonary cryptococcal disease. Initially unable to perform bronchoscopy due to hypoxia. 5/28 patient had altered LOC, became very lethargic/obtunded requiring CT and LP, complicated by acute respiratory failure due to altered mental status that required mechanical ventilation. He had a lumbar CSF drain to depression arise. Now extubated, drain removed. Failed swallowing evaluation and has Cor trak forte tube feeds. Mental status slightly better but waxing and waning. ID, neurology, neurosurgery and CCM consulted during hospital course. Care transferred to stepdown and [place] on 01/09/17. MS slowly improving. Cor Trak discontinued 6/12 and diet initiated. Transferred to medical bed 6/13.Acute kidney injury on 6/14 (creatinine 2.8), likely due to acute urinary retention & Amphotericin> resolved. Antimicrobials per ID  Assessment & Plan:   Principal Problem:   Perirectal abscess Active Problems:   Chronic pain   AIDS (acquired immune deficiency syndrome) (HCC)   Hyponatremia   Normocytic  anemia   Elevated LFTs   Pulmonary infiltrates   Hypomagnesemia   Encephalopathy acute   SIRS (systemic inflammatory response syndrome) (HCC)   Acute respiratory failure (HCC)   Rectal abscess   Cryptococcal meningoencephalitis (HCC)   Cryptococcosis (HCC)   Cavitary pneumonia   Diffuse lymphadenopathy   Drug rash   Elevated intracranial pressure   HIV disease (HCC)   Lymphadenopathy of head and neck   HIV (human immunodeficiency virus infection) ([place])   Meningitis   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 ID following. Lumbar drain removed 6/10. Repeat CSF culture negative and final. As per ID input 6/18, will finish out 28 days of amphotericin and flucytosine with plans to switch to maintenance fluconazole 400 daily which should be done in the next 24 hours, discussed with ID today they do not see an indication for LP currently 2. Acute urinary retention: Foley catheter had been removed but had to be reinserted on 6/14 yielding 1.2 L right away. Unclear etiology. Continue Foley catheter.? Trial of Flomax. 3. Acute kidney injury: Creatinine went up from 1.06 > 2.84. Secondary to Acute Urinary Retention and amphotericin. Acute kidney injury resolved, creatinine is improving it is 0.89 4. Acute hypoxic respiratory failure secondary to cryptococcal pneumonia: Extubated 6/5. Resolved. 5. Acute encephalopathy: Multifactorial >cryptococcal meningitis, acute strokes, metabolic derangements, medication effect and respiratory failure. Has slowly improved but not sure if he'll ever get back to previous baseline. 6. Acute ischemic stroke: Due to small vessel involvement from his cryptococcal infection. As per neurology, continue with  antifungal treatment and no role for antiplatelet agents or  statins in this setting.  7. HIV/AIDS: CD4 count this admission 21 and high viral load >1 million. Infectious disease following. Continue prophylaxis with Bactrim and azithromycin. ART not initiated due to high risk for IRIS 8. Perirectal abscess: Status post I&D. Twice a day dressing changes. WOC following. 9. Genital herpes: Completed valacyclovir. 10. Hypomagnesemia: Replace & follow as needed. Pharmacy managing. 11. Hyponatremia/? SIADH: Sodium is drifting down gradually, will place on fluid restrictions 12. Diarrhea: Stool culture negative. Resolved. 13. Anemia of chronic disease: Hb slowly dropping> no bleeding. ? D/T Flucytosine. Hemoglobin down to 7 g on 6/18. Transfuse 1 unit PRBC. I was unable to reach patient's mother to discuss or consent. Discussed with RN to try to consent overnight. 14. Dysphagia: Cor Trak removed a few days ago. Oral intake has gradually improved.  15. Elevated blood pressure: When necessary IV hydralazine and monitor. 16. Thrombocytopenia: Unclear etiology.? Medication effect/Flucytosine. Stable. No bleeding reported. Stable. 17. Facial swelling: I did not notice this but ID did later in the day.? Related to hypoalbuminemia and positioning and if worsens repeat noncontrasted head CT.   DVT prophylaxis: Subcutaneous heparin Code Status: Full Family Communication: Mother of his son at bedside Disposition: Transferred from stepdown unit to telemetry on 6/13. DC to SNF when improved> Rehab did not feel appropriate for CIR.   Consultants:  CCM-signed off Infectious disease Neurology Neurosurgery   Procedures:  OETT 8.0 5/28 - 6/5 OGT 5/28 - 6/5 Lumbar Drain 5/28 >>DC'ed 6/10 Foley 5/28 >> 6/12, 6/14 > PIV  Cor trak> discontinued 6/12   Antimicrobials:  Triumeq 5/23 - 5/24 Flagyl 5/23 - 5/24 Vancomycin 5/23 - 5/24 Doxycycline 5/24 - 5/25 Rocephin 5/23 - 5/30 Augmentin 5/24 - 5/25 Bactrim 5/23 >> Flucytosine 5/24 >> Valtrex 5/24 >> 6/3    Azithromycin qweek 5/24 >> Ambisome 5/24 >>   MICROBIOLOGY: MRSA PCR 5/23:  Negative  Blood Cultures x2 5/22:  2/2 Bottles Positive Cryptococcus neoformans  Blood Cultures x2 5/23:  2/2 Bottles Positive Cryptococcus neoformans  Wound Culture 5/23:  Multiple organisms present CSF Culture 5/24:  Abundant Cryptococcus neoformans  Quantiferon TB 5/26:  Negative  CSF Culture 5/27:  Cryptococcus neoformans  BAL RML 5/28:  Cryptococcus neoformans / AFB smear negative & culture pending Tracheal Aspirate AFB 5/30 >>> Smear negative / Ctx pending Blood Cultures x2 5/30:  Negative  Stool Culture 5/31:  Negative  Stool Gastrointestinal Panel 5/31:  Negative Stool C diff 5/31:  Negative  CSF Culture 6/1 >> Encapsulated yeast seen   Subjective: Patient reported she is feeling better today, reports some generalized pain, otherwise afebrile, denies any chest pain or shortness of breath   Objective:  Vitals:   01/17/17 0119 01/17/17 0320 01/17/17 0532 01/17/17 1408  BP: (!) 154/88 (!) 149/83 120/74 (!) 151/91  Pulse: 90 92 89 82  Resp: (!) _0 Temp: 99.6 F (37.6 C) 99.9 F (37.7 C) 99.4 F (37.4 C) 98.3 F (36.8 C)  TempSrc: Oral Oral  Oral  SpO2: 99% 99% 99% 99%  Weight:      Height:        Examination:  General exam: Pleasant young male, well built and nourished, lying comfortably propped up in bed. Does not appear in any distress. Respiratory system: Good air entry bilaterally, clear to auscultation, no use of accessory muscle Cardiovascular system: S1 and S2 heard, RRR. No JVD, murmurs or pedal edema.  Gastrointestinal system: Soft, nontender,  nondistended, bowel sounds present . Foley+. Stable without change Central nervous system: Mental status as stated above and improving. No focal neurological deficits.  Extremities: less rigidity of upper extremities and moves to command. No focal deficits.  Skin: Extensive tattooing of his body. Psychiatry: Judgement and  insight impaired. Mood & affect : Flat.    Data Reviewed: I have personally reviewed following labs and imaging studies  CBC:  Recent Labs Lab 01/13/17 0530 01/14/17 0633 01/15/17 0659 01/16/17 0640 01/17/17 0630  WBC 5.6 5.1 5.4 4.9 5.5  NEUTROABS  --   --   --  3.8  --   HGB 7.8* 7.7* 7.2* 7.0* 8.0*  HCT 24.3* 24.3* 22.4* 22.2* 24.5*  MCV 84.4 86.5 85.8 84.4 86.0  PLT 126* 125* 111* 105* 92*   Basic Metabolic Panel:  Recent Labs Lab 01/13/17 0530 01/14/17 0633 01/15/17 0659 01/16/17 0640 01/17/17 0630  NA 140 139 137 134*  133* 131*  K 3.9 3.9 4.2 4.0  4.0 3.7  CL 112* 113* 112* 109  109 106  CO2 20* 20* 19* 19*  18* 20*  GLUCOSE 112* 90 91 93  92 92  BUN 41* 33* 25* _0 CREATININE 1.77* 1.49* 1.19 1.02  1.03 0.89  CALCIUM 9.3 9.3 8.6* 8.4*  8.4* 8.2*  MG 1.7 1.4* 1.4* 1.5* 1.7  PHOS 6.0* 5.1* 4.7* 4.7* 4.5   Liver Function Tests:  Recent Labs Lab 01/13/17 0530 01/14/17 0633 01/15/17 0659 01/16/17 0640 01/17/17 0630  ALBUMIN 2.4* 2.5* 2.5* 2.2* 2.2*   Coagulation Profile: No results for input(s): INR, PROTIME in the last 168 hours. CBG:  Recent Labs Lab 01/10/17 2314 01/11/17 0408 01/11/17 0845 01/11/17 1306 01/11/17 1634  GLUCAP 129* 98 100* 103* 115*    No results found for this or any previous visit (from the past 240 hour(s)).       Radiology Studies: No results found.      Scheduled Meds: . amantadine  100 mg Oral BID WC  . azithromycin  1,200 mg Oral Weekly  . feeding supplement (ENSURE ENLIVE)  237 mL Oral TID BM  . flucytosine  1,500 mg Oral Q6H  . heparin subcutaneous  5,000 Units Subcutaneous Q8H  . magnesium oxide  400 mg Oral BID  . mouth rinse  15 mL Mouth Rinse BID  . sodium chloride flush  3 mL Intravenous Q12H  . sulfamethoxazole-trimethoprim  1 tablet Oral Daily   Continuous Infusions: . sodium chloride 50 mL/hr at 01/16/17 1951  . sodium chloride Stopped (01/17/17 0038)   And  . sodium  chloride Stopped (01/16/17 1841)   And  . amphotericin  B  Liposome (AMBISOME) ADULT IV Stopped (01/16/17 2234)     LOS: 48 days     Nona Gracey, MD,  [facility] Pager [phone]  If 7PM-7AM, please contact night-coverage [email or web address] Password [email or web address] 01/17/2017, 2:20 PM

## 2017-01-17 NOTE — Progress Notes (Addendum)
[place] for Infectious Disease    Date of Admission:  12/20/2016   Total days of antibiotics 27        Day 27 ampho/flucytosine        Day 23 oi proph   ID: William Pena is a 31 y.o. male with advanced hiv disease, disseminated CM Principal Problem:   Perirectal abscess Active Problems:   Chronic pain   AIDS (acquired immune deficiency syndrome) (HCC)   Hyponatremia   Normocytic anemia   Elevated LFTs   Pulmonary infiltrates   Hypomagnesemia   Encephalopathy acute   SIRS (systemic inflammatory response syndrome) (HCC)   Acute respiratory failure (HCC)   Rectal abscess   Cryptococcal meningoencephalitis (HCC)   Cryptococcosis (HCC)   Cavitary pneumonia   Diffuse lymphadenopathy   Drug rash   Elevated intracranial pressure   HIV disease (HCC)   Lymphadenopathy of head and neck   HIV (human immunodeficiency virus infection) ([place])   Meningitis   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 amantadine  100 mg Oral BID WC  . azithromycin  1,200 mg Oral Weekly  . feeding supplement (ENSURE ENLIVE)  237 mL Oral TID BM  . flucytosine  1,500 mg Oral Q6H  . heparin subcutaneous  5,000 Units Subcutaneous Q8H  . magnesium oxide  400 mg Oral BID  . mouth rinse  15 mL Mouth Rinse BID  . sodium chloride flush  3 mL Intravenous Q12H  . sulfamethoxazole-trimethoprim  1 tablet Oral Daily    Objective: Vital signs in last 24 hours: Temp:  [98.3 F (36.8 C)-99.9 F (37.7 C)] 98.3 F (36.8 C) (06/19 1408) Pulse Rate:  [82-92] 82 (06/19 1408) Resp:  [18-22] 20 (06/19 1408) BP: (120-164)/(74-91) 151/91 (06/19 1408) SpO2:  [99 %-100 %] 99 % (06/19 1408) Physical Exam  Constitutional: [name] oriented to person. He appears well-developed and well-nourished. No distress.  HENT: noticeable swollen eyelid, less than  yesterday, but no injected conjunctiva, no pain with EOM Neck: mild nuchal rigidity Mouth/Throat: Oropharynx is clear and moist. No oropharyngeal exudate.  Cardiovascular: Normal rate, regular rhythm and normal heart sounds. Exam reveals no gallop and no friction rub.  No murmur heard.  Pulmonary/Chest: Effort normal and breath sounds normal. No respiratory distress. He has no wheezes.  Abdominal: Soft. Bowel sounds are normal. He exhibits no distension. There is no tenderness.  Lymphadenopathy:  He has no cervical adenopathy.  Neurological: [name] alert and oriented to person, follows simple commands Skin: Skin is warm and dry. No rash noted. No erythema.      Lab Results  Recent Labs  01/16/17 0640 01/17/17 0630  WBC 4.9 5.5  HGB 7.0* 8.0*  HCT 22.2* 24.5*  NA 134*  133* 131*  K 4.0  4.0 3.7  CL 109  109 106  CO2 19*  18* 20*  BUN 19  19 15   CREATININE 1.02  1.03 0.89   Liver Panel  Recent Labs  01/16/17 0640 01/17/17 0630  ALBUMIN 2.2* 2.2*    Microbiology: 6/7 csf no growth, but yeast on gram stain Studies/Results: No results found.   Assessment/Plan: Disseminated cryptococcal meningitis = will finish out 28 days of L-ampho and flucytosine with plans to switch to maintenance fluconazole 400mg  daily on Thursday.  Facial swelling = unsure if related  to hypoalbuminemia and positioning doubt it is CNS since it would be odd to have unilateral swelling. If worsens, would repeat NCHCT  Medication monitoring = kidney function is back to normal. Replete electrolytes per pharmacy  oi proph = continue on bactrim daily plus weekly azithromycin  hiv disease= holding off initiation of ART due to high risk for IRIS, plan to initiate next week  [facility], [facility] for Infectious Diseases Cell: [phone] Pager: [phone]  01/17/2017, 3:06 PM

## 2017-01-17 NOTE — Progress Notes (Signed)
Physical Therapy Treatment Patient Details Name: William Pena MRN: [id number] DOB: 1985/10/09 Today's Date: 01/17/2017    History of Present Illness Patient is a 31 y/o male presents with sepsis secondary to perirectal abscess s/p I&D 5/23 and disseminated cryptococcal infection including meningitis, bacteremia, and likely pneumonia. Intubated 5/28-6/5. Pt found to have acute ischemic strokes. 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 Second MRI- extension of the diffusion restriction in the left basal ganglia and right basal ganglia. s/p Lumbar drain 5/30. PMH includes advanced hiv disease    PT Comments    Pt making little progress lately.  Pt is profoundly weak in limbs and trunk.  Pt's UE stay in a flexed position due to pt refusing them to be on (per RN).   Discussed the impt of the braces to be used as much as he can tolerate.  Emphasis on sitting EOB and working to elicit truncal activity of any kind over more than 15 minute time.    Follow Up Recommendations  SNF     Equipment Recommendations   (TBA)    Recommendations for Other Services       Precautions / Restrictions Precautions Precautions: Fall    Mobility  Bed Mobility Overal bed mobility: Needs Assistance Bed Mobility: Rolling;Sidelying to Sit;Sit to Sidelying Rolling: Total assist Sidelying to sit: Total assist     Sit to sidelying: Total assist General bed mobility comments: pt unable to assist more than a general overall stiffening of his trunk  Transfers                    Ambulation/Gait                 Stairs            Wheelchair Mobility    Modified Rankin (Stroke Patients Only) Modified Rankin (Stroke Patients Only) Pre-Morbid Rankin Score: No symptoms Modified Rankin: Severe disability     Balance Overall balance assessment: Needs assistance Sitting-balance support: Feet supported Sitting balance-Leahy  Scale: Zero Sitting balance - Comments: pt sat EOB for 15 min working on truncal activation with cues, startle and getting outside his BOS with pt trying to get back into BOS.  pt with soft elbow extension  padding.                                    Cognition Arousal/Alertness: Awake/alert;Lethargic Behavior During Therapy: Flat affect Overall Cognitive Status: Impaired/Different from baseline Area of Impairment: Attention;Following commands;Problem solving                   Current Attention Level: Focused   Following Commands: Follows one step commands inconsistently Safety/Judgement: Decreased awareness of safety;Decreased awareness of deficits Awareness: Intellectual Problem Solving: Slow processing;Decreased initiation;Requires verbal cues;Requires tactile cues General Comments: Inconsistently following one step commands, responds better to mother      Exercises Other Exercises Other Exercises: PROM to bil LE Other Exercises: Bil elbow stretch into ~20* extension lag and application of soft elbow extension padding.    General Comments        Pertinent Vitals/Pain Pain Assessment: Faces Faces Pain Scale: No hurt Pain Intervention(s): Monitored during session    Home Living                      Prior Function  PT Goals (current goals can now be found in the care plan section) Progress towards PT goals: Not progressing toward goals - comment (little change noted.)    Frequency    Min 3X/week      PT Plan Current plan remains appropriate    Co-evaluation              AM-PAC PT "6 Clicks" Daily Activity  Outcome Measure  Difficulty turning over in bed (including adjusting bedclothes, sheets and blankets)?: Total Difficulty moving from lying on back to sitting on the side of the bed? : Total Difficulty sitting down on and standing up from a chair with arms (e.g., wheelchair, bedside commode, etc,.)?:  Total Help needed moving to and from a bed to chair (including a wheelchair)?: Total Help needed walking in hospital room?: Total Help needed climbing 3-5 steps with a railing? : Total 6 Click Score: 6    End of Session   Activity Tolerance: Patient tolerated treatment well Patient left: in bed;with call bell/phone within reach;with bed alarm set Nurse Communication: Mobility status PT Visit Diagnosis: Muscle weakness (generalized) (M62.81);Other symptoms and signs involving the nervous system (R29.898);Hemiplegia and hemiparesis Hemiplegia - Right/Left:  (both affected) Hemiplegia - caused by: Cerebral infarction     Time: 2233-6122 PT Time Calculation (min) (ACUTE ONLY): 32 min  Charges:  $Therapeutic Activity: 23-37 mins                    G Codes:       2017/02/12  Donnella Sham, PT [phone]  (pager)   [name] 02-12-2017, 6:04 PM

## 2017-01-18 LAB — TYPE AND SCREEN
ABO/RH(D): O POS
Antibody Screen: NEGATIVE
Unit division: 0

## 2017-01-18 LAB — RENAL FUNCTION PANEL
ALBUMIN: 2.1 g/dL — AB (ref 3.5–5.0)
ANION GAP: 4 — AB (ref 5–15)
BUN: 14 mg/dL (ref 6–20)
CO2: 20 mmol/L — AB (ref 22–32)
Calcium: 8.1 mg/dL — ABNORMAL LOW (ref 8.9–10.3)
Chloride: 108 mmol/L (ref 101–111)
Creatinine, Ser: 0.83 mg/dL (ref 0.61–1.24)
GFR calc Af Amer: >60 mL/min (ref >60)
GFR calc non Af Amer: >60 mL/min (ref >60)
GLUCOSE: 92 mg/dL (ref 65–99)
PHOSPHORUS: 3.7 mg/dL (ref 2.5–4.6)
Potassium: 3.9 mmol/L (ref 3.5–5.1)
SODIUM: 132 mmol/L — AB (ref 135–145)

## 2017-01-18 LAB — BPAM RBC
Blood Product Expiration Date: 201807152359
ISSUE DATE / TIME: 201806190049
Unit Type and Rh: 5100

## 2017-01-18 LAB — CRYPTOCOCCAL ANTIGEN
CRYPTOCOCCAL AG TITER: 160 — AB
Crypto Ag: POSITIVE — AB

## 2017-01-18 LAB — MAGNESIUM: Magnesium: 1.1 mg/dL — ABNORMAL LOW (ref 1.7–2.4)

## 2017-01-18 MED ORDER — SENNOSIDES-DOCUSATE SODIUM 8.6-50 MG PO TABS
2.0000 | ORAL_TABLET | Freq: Two times a day (BID) | ORAL | Status: DC
  Administered 2017-01-18 – 2017-02-06 (×33): 2 via ORAL
  Filled 2017-01-18 (×39): qty 2

## 2017-01-18 MED ORDER — HYDROCODONE-ACETAMINOPHEN 5-325 MG PO TABS
1.0000 | ORAL_TABLET | Freq: Four times a day (QID) | ORAL | Status: DC | PRN
  Administered 2017-01-18 – 2017-01-24 (×12): 1 via ORAL
  Filled 2017-01-18 (×14): qty 1

## 2017-01-18 MED ORDER — BISACODYL 5 MG PO TBEC
10.0000 mg | DELAYED_RELEASE_TABLET | Freq: Once | ORAL | Status: AC
  Administered 2017-01-18: 10 mg via ORAL
  Filled 2017-01-18: qty 2

## 2017-01-18 MED ORDER — MAGNESIUM SULFATE 2 GM/50ML IV SOLN
2.0000 g | Freq: Once | INTRAVENOUS | Status: AC
  Administered 2017-01-18: 2 g via INTRAVENOUS
  Filled 2017-01-18: qty 50

## 2017-01-18 MED ORDER — POLYETHYLENE GLYCOL 3350 17 G PO PACK
17.0000 g | PACK | Freq: Two times a day (BID) | ORAL | Status: AC
Start: 2017-01-18 — End: 2017-01-21
  Administered 2017-01-18 – 2017-01-20 (×5): 17 g via ORAL
  Filled 2017-01-18 (×5): qty 1

## 2017-01-18 NOTE — Progress Notes (Signed)
No beds available at SNF yet. Still on difficult to place list.   Cedric Fishman [facility] [phone]

## 2017-01-18 NOTE — Progress Notes (Signed)
  Speech Language Pathology Treatment: Dysphagia  Patient Details Name: William Pena MRN: [id number] DOB: [birth year] Today's Date: 01/18/2017 Time: 1191-4782 SLP Time Calculation (min) (ACUTE ONLY): 23 min  Assessment / Plan / Recommendation Clinical Impression  Pt initially more interactive today with quicker responses.  He does become distracted easily and benefits from moderate cues to focus on tasks.  SLP observed pt consuming nectar thickened Sprite (holding his own cup) with great tolerance.  No indications of airway compromise with po today and pt's lung sounds are clear/decreased.  Pt appeared encouraged with his performance today.   He only allowed [facility] to be raised to 78* due to back discomfort despite education re: aspiration/aspiration pneumonia risk.  SLP obtained icee for pt, be he declined to consume secondary to discomfort.    Family arrived during session and mother of his child advised that he consumed macaroni and cheese yesterday with good tolerance.  Encouraged pt to continue po to aid wound healing.  Recommend he continue dys3/nectar diet - icee allowed with strict precautions including sitting upright and intermittent cough/throat clearing!  Education ongoing with pt/family as well as determination for dietary advancement.      HPI HPI: Patient is a 31 y/o male presents with sepsis secondary to perirectal abscess s/p I&D 5/23 and disseminated cryptococcal infection including meningitis, bacteremia, and likely pneumonia. Intubated 5/28-6/5. 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 Second MRI- extension of the diffusion restriction in the left basal ganglia and right basal ganglia. s/p Lumbar drain 5/30. PMH includes advanced hiv disease      SLP Plan  Continue with current plan of care       Recommendations  Diet recommendations: Dysphagia 3 (mechanical soft);Nectar-thick liquid Liquids provided via:  Cup;Straw Medication Administration: Whole meds with puree (with pudding, crushed if large) Supervision: Staff to assist with self feeding;Full supervision/cueing for compensatory strategies (have pt hold his own cup as able for maximal safety) Compensations: Minimize environmental distractions;Slow rate;Small sips/bites;Other (Comment) (intermittent throat clear/cough and reswallow if reflexively observed) Postural Changes and/or Swallow Maneuvers: Seated upright 90 degrees;Upright 30-60 min after meal                Oral Care Recommendations: Oral care BID Follow up Recommendations: 24 hour supervision/assistance SLP Visit Diagnosis: Dysphagia, oropharyngeal phase (R13.12) Plan: Continue with current plan of care       [place], [place], [place] [employer] SLP [phone]

## 2017-01-18 NOTE — Progress Notes (Signed)
Occupational Therapy Treatment Patient Details Name: William Pena MRN: [id number] DOB: 07/31/1986 Today's Date: 01/18/2017    History of present illness Patient is a 31 y/o male presents with sepsis secondary to perirectal abscess s/p I&D 5/23 and disseminated cryptococcal infection including meningitis, bacteremia, and likely pneumonia. Intubated 5/28-6/5. Pt found to have acute ischemic strokes. 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 Second MRI- extension of the diffusion restriction in the left basal ganglia and right basal ganglia. s/p Lumbar drain 5/30. PMH includes advanced hiv disease   OT comments  Pt demonstrates improving trunk control and mobility.  [name] able to perform simple grooming activities with mod A, overall. He was able to sit EOB x ~25 mins with min a for facilitation and stood with total A +2.  Pt spontaneously verbalizing at times.  He demonstrates significant apraxia and initiation deficits.  Will continue to follow.   Follow Up Recommendations  SNF    Equipment Recommendations  None recommended by OT    Recommendations for Other Services Rehab consult    Precautions / Restrictions Precautions Precautions: Fall       Mobility Bed Mobility Overal bed mobility: Needs Assistance Bed Mobility: Supine to Sit;Sit to Sidelying     Supine to sit: Max assist   Sit to sidelying: Max assist;Total assist;+2 for safety/equipment General bed mobility comments: Pt requires assist to initiate movement.  He was able to assist with lifting and lowering trunk when facilitation for trunk flexion provided   Transfers Overall transfer level: Needs assistance   Transfers: Sit to/from Stand Sit to Stand: Max assist;Total assist;+2 physical assistance         General transfer comment: able to come up to full extension with bil knee and truncal support    Balance Overall balance assessment: Needs  assistance Sitting-balance support: Feet supported Sitting balance-Leahy Scale: Poor Sitting balance - Comments: Pt able to maintain EOB sitting ~25 mins with min facilitation to trunk control and balance      Standing balance-Leahy Scale: Zero Standing balance comment: total support of 2 for full upright posture                           ADL either performed or assessed with clinical judgement   ADL Overall ADL's : Needs assistance/impaired     Grooming: Wash/dry face;Brushing hair;Moderate assistance;Sitting Grooming Details (indicate cue type and reason): Pt is able to wash face with min A and cues for thoroughness.  [name] unable to comb hair, but will move comb to ear and attempt to comb, but unable to lift comb higher at this time                  Toilet Transfer: Maximal assistance;+2 for physical assistance;BSC           Functional mobility during ADLs: Maximal assistance;+2 for physical assistance;Rolling walker       Vision       Perception     Praxis      Cognition Arousal/Alertness: Awake/alert Behavior During Therapy: Flat affect Overall Cognitive Status: Impaired/Different from baseline Area of Impairment: Attention;Following commands;Problem solving                   Current Attention Level: Focused;Sustained   Following Commands: Follows one step commands inconsistently;Follows one step commands with increased time Safety/Judgement: Decreased awareness of deficits Awareness: Intellectual Problem Solving: Slow processing;Decreased initiation;Requires tactile cues;Requires verbal  cues General Comments: Pt demonstrates deficits with initiation and demonstrates ideomotor apraxia.  He demonstrates ~30-45 second delay with processing.           Exercises General Exercises - Upper Extremity Elbow Flexion: PROM;Both;10 reps;Supine Elbow Extension: PROM;Both;10 reps;Supine Composite Extension: PROM;Both;10 reps;Supine Other  Exercises Other Exercises: PROM to bil LE though mobility Other Exercises: Bil elbow extention/slow stretch to ~15-20* extension lag/  AAROM to UE's during ADL tasks   Shoulder Instructions       General Comments      Pertinent Vitals/ Pain       Pain Assessment: Faces Faces Pain Scale: Hurts even more Pain Location: back and bottom Pain Descriptors / Indicators: Aching;Discomfort Pain Intervention(s): Monitored during session  Home Living                                          Prior Functioning/Environment              Frequency  Min 3X/week        Progress Toward Goals  OT Goals(current goals can now be found in the care plan section)  Progress towards OT goals: Progressing toward goals  Acute Rehab OT Goals Patient Stated Goal: none stated by pt  Plan Discharge plan remains appropriate    Co-evaluation    PT/OT/SLP Co-Evaluation/Treatment: Yes Reason for Co-Treatment: Complexity of the patient's impairments (multi-system involvement);Necessary to address cognition/behavior during functional activity;For patient/therapist safety;To address functional/ADL transfers PT goals addressed during session: Mobility/safety with mobility OT goals addressed during session: ADL's and self-care;Strengthening/ROM      AM-PAC PT "6 Clicks" Daily Activity     Outcome Measure   Help from another person eating meals?: A Lot Help from another person taking care of personal grooming?: A Lot Help from another person toileting, which includes using toliet, bedpan, or urinal?: Total Help from another person bathing (including washing, rinsing, drying)?: Total Help from another person to put on and taking off regular upper body clothing?: Total Help from another person to put on and taking off regular lower body clothing?: Total 6 Click Score: 8    End of Session    OT Visit Diagnosis: Cognitive communication deficit (R41.841);Apraxia (R48.2);Muscle  weakness (generalized) (M62.81) Symptoms and signs involving cognitive functions: Cerebral infarction Pain - Right/Left: Left Pain - part of body: Shoulder   Activity Tolerance Patient tolerated treatment well   Patient Left in bed;with call bell/phone within reach;with bed alarm set   Nurse Communication Mobility status        Time: 1517-6160 OT Time Calculation (min): 42 min  Charges: OT Treatments $Neuromuscular Re-education: 23-37 mins  [facility], OTR/L [phone]    Lucille Passy M 01/18/2017, 5:21 PM

## 2017-01-18 NOTE — Progress Notes (Signed)
Physical Therapy Treatment Patient Details Name: William Pena MRN: [id number] DOB: 06-17-1986 Today's Date: 01/18/2017    History of Present Illness Patient is a 31 y/o male presents with sepsis secondary to perirectal abscess s/p I&D 5/23 and disseminated cryptococcal infection including meningitis, bacteremia, and likely pneumonia. Intubated 5/28-6/5. Pt found to have acute ischemic strokes. 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 Second MRI- extension of the diffusion restriction in the left basal ganglia and right basal ganglia. s/p Lumbar drain 5/30. PMH includes advanced hiv disease    PT Comments    Improved over yesterday's session with pt able to take the subtle cues/facilitation and make changes to balance though they were delayed and uncoordinated.  Pt  Noticeably participated in transitions during bed mobility, sitting balance and standing.  Follow Up Recommendations  SNF     Equipment Recommendations  Other (comment) (TBA next venue)    Recommendations for Other Services       Precautions / Restrictions Precautions Precautions: Fall    Mobility  Bed Mobility Overal bed mobility: Needs Assistance Bed Mobility: Supine to Sit;Sit to Sidelying     Supine to sit: Max assist   Sit to sidelying: Max assist;Total assist;+2 for safety/equipment General bed mobility comments: Though total assist, pt able to stay involved going down to L elbow and into sidelying with truncal and LE assist  Transfers Overall transfer level: Needs assistance   Transfers: Sit to/from Stand Sit to Stand: Max assist;Total assist;+2 physical assistance         General transfer comment: able to come up to full extension with bil knee and truncal support  Ambulation/Gait                 Stairs            Wheelchair Mobility    Modified Rankin (Stroke Patients Only)       Balance Overall balance  assessment: Needs assistance Sitting-balance support: Feet supported Sitting balance-Leahy Scale: Poor Sitting balance - Comments: sat EOB ~25 min working on truncal activation with support /facilitation given subtly through UE's and should girdle.        Standing balance comment: total support of 2 for full upright posture                            Cognition Arousal/Alertness: Awake/alert Behavior During Therapy: Flat affect Overall Cognitive Status: Impaired/Different from baseline Area of Impairment: Attention;Following commands;Problem solving                   Current Attention Level: Focused   Following Commands: Follows one step commands inconsistently;Follows one step commands with increased time Safety/Judgement: Decreased awareness of deficits Awareness: Intellectual Problem Solving: Slow processing;Decreased initiation;Requires tactile cues General Comments: needs extra time to execute a task and needs minimal input.      Exercises Other Exercises Other Exercises: PROM to bil LE though mobility Other Exercises: Bil elbow extention/slow stretch to ~15-20* extension lag/  AAROM to UE's during ADL tasks    General Comments        Pertinent Vitals/Pain Pain Assessment: Faces Faces Pain Scale: Hurts even more Pain Location: back and bottom Pain Descriptors / Indicators: Aching;Discomfort Pain Intervention(s): Monitored during session    Home Living                      Prior Function  PT Goals (current goals can now be found in the care plan section) Acute Rehab PT Goals Patient Stated Goal: none stated by pt Progress towards PT goals: Progressing toward goals    Frequency    Min 3X/week      PT Plan Current plan remains appropriate    Co-evaluation PT/OT/SLP Co-Evaluation/Treatment: Yes Reason for Co-Treatment: Complexity of the patient's impairments (multi-system involvement);To address functional/ADL  transfers PT goals addressed during session: Mobility/safety with mobility        AM-PAC PT "6 Clicks" Daily Activity  Outcome Measure  Difficulty turning over in bed (including adjusting bedclothes, sheets and blankets)?: Total Difficulty moving from lying on back to sitting on the side of the bed? : Total Difficulty sitting down on and standing up from a chair with arms (e.g., wheelchair, bedside commode, etc,.)?: Total Help needed moving to and from a bed to chair (including a wheelchair)?: Total Help needed walking in hospital room?: Total Help needed climbing 3-5 steps with a railing? : Total 6 Click Score: 6    End of Session   Activity Tolerance: Patient tolerated treatment well Patient left: in bed;with call bell/phone within reach;with bed alarm set Nurse Communication: Mobility status PT Visit Diagnosis: Muscle weakness (generalized) (M62.81);Other symptoms and signs involving the nervous system (R29.898);Hemiplegia and hemiparesis Hemiplegia - Right/Left: Left Hemiplegia - dominant/non-dominant: Non-dominant Hemiplegia - caused by: Cerebral infarction     Time: 4315-4008 PT Time Calculation (min) (ACUTE ONLY): 42 min  Charges:  $Therapeutic Activity: 8-22 mins                    G Codes:       19-Jan-2017  William Pena, PT [phone]  (pager)   William Pena 19-Jan-2017, 4:55 PM

## 2017-01-18 NOTE — Progress Notes (Signed)
William Pena for Infectious Disease    Date of Admission:  12/20/2016   Total days of antibiotics 28        Day 28 ampho/flucytosine        Day 24 oi proph   ID: William Pena is a 31 y.o. male with advanced hiv disease, disseminated CM Principal Problem:   Perirectal abscess Active Problems:   Chronic pain   AIDS (acquired immune deficiency syndrome) (HCC)   Hyponatremia   Normocytic anemia   Elevated LFTs   Pulmonary infiltrates   Hypomagnesemia   Encephalopathy acute   SIRS (systemic inflammatory response syndrome) (HCC)   Acute respiratory failure (HCC)   Rectal abscess   Cryptococcal meningoencephalitis (HCC)   Cryptococcosis (HCC)   Cavitary pneumonia   Diffuse lymphadenopathy   Drug rash   Elevated intracranial pressure   HIV disease (HCC)   Lymphadenopathy of head and neck   HIV (human immunodeficiency virus infection) ([place])   Meningitis   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 amantadine  100 mg Oral BID WC  . azithromycin  1,200 mg Oral Weekly  . feeding supplement (ENSURE ENLIVE)  237 mL Oral TID BM  . flucytosine  1,500 mg Oral Q6H  . heparin subcutaneous  5,000 Units Subcutaneous Q8H  . magnesium oxide  400 mg Oral BID  . mouth rinse  15 mL Mouth Rinse BID  . polyethylene glycol  17 g Oral BID  . senna-docusate  2 tablet Oral BID  . sodium chloride flush  3 mL Intravenous Q12H  . sulfamethoxazole-trimethoprim  1 tablet Oral Daily    Objective: Vital signs in last 24 hours: Temp:  [98.4 F (36.9 C)-99.6 F (37.6 C)] 99.6 F (37.6 C) (06/20 0647) Pulse Rate:  [84-95] 84 (06/20 0647) Resp:  [16-20] 16 (06/20 0647) BP: (127-140)/(77-84) 140/84 (06/20 0647) SpO2:  [100 %] 100 % (06/20 0647) Physical Exam  Constitutional: [name] sleeping. He appears well-developed and well-nourished. No distress.  HENT: no  conjunctivities Mouth/Throat: Oropharynx is clear and moist. No oropharyngeal exudate.  Abdominal: Soft. Bowel sounds are normal. He exhibits no distension. There is no tenderness.  Skin: Skin is warm and dry. No rash noted. No erythema.      Lab Results  Recent Labs  01/16/17 0640 01/17/17 0630 01/18/17 0758  WBC 4.9 5.5  --   HGB 7.0* 8.0*  --   HCT 22.2* 24.5*  --   NA 134*  133* 131* 132*  K 4.0  4.0 3.7 3.9  CL 109  109 106 108  CO2 19*  18* 20* 20*  BUN 19  19 15 14   CREATININE 1.02  1.03 0.89 0.83   Liver Panel  Recent Labs  01/17/17 0630 01/18/17 0758  ALBUMIN 2.2* 2.1*    Microbiology: 6/7 csf no growth, but yeast on gram stain Studies/Results: No results found.   Assessment/Plan: Disseminated cryptococcal meningitis = will finish out 28 days of L-ampho and flucytosine with plans to switch to maintenance fluconazole 400mg  daily tomorrow. Will check serum cr ag  Facial swelling = appears resolved  Medication monitoring = kidney function is back to normal. Replete electrolytes per pharmacy  oi proph = continue on bactrim daily plus weekly azithromycin  hiv disease= holding off initiation of ART due to high risk for IRIS, plan to initiate next week  Baxter [place] [facility] Cell: [phone] Pager: [phone]  01/18/2017, 5:50 PM

## 2017-01-18 NOTE — Progress Notes (Signed)
CRITICAL VALUE ALERT  Critical Value:  Cryptococcus positive  Date & Time Notied:  01/18/2017 at 85  Provider Notified: MD Elgergawy  Orders Received/Actions taken:

## 2017-01-18 NOTE — Progress Notes (Addendum)
PROGRESS NOTE   William Pena  PIR:[id number]    DOB: 1985-12-01    DOA: 12/20/2016  PCP: [name], MD   I have briefly reviewed patients previous medical records in [facility].  Brief Narrative:  31 year old male with a PMH of HIV/AIDS (last CD4 on 12/21/16:21), just started back on ART 2 months PTA, however he was incarcerated and reported that he was not getting his medications, admitted on 5/22 for perirectal abscess. During admission he was also noted to have headache, fever, chills, night sweats, neck pain and lymphadenopathy. ID was consulted and recommended imaging of chest, LP and cultures be sent from perirectal I&D. Also his serum cryptococcal antigen titer was elevated raising the possibility of disseminated cryptococcal infection including lung involvement. Pulmonology was consulted by ID for bronchoscopy and washings in an effort to help determine if his CT changes reflect TB or possible pulmonary cryptococcal disease. Initially unable to perform bronchoscopy due to hypoxia. 5/28 patient had altered LOC, became very lethargic/obtunded requiring CT and LP, complicated by acute respiratory failure due to altered mental status that required mechanical ventilation. He had a lumbar CSF drain to depression arise. Now extubated, drain removed. Failed swallowing evaluation and has Cor trak forte tube feeds. Mental status slightly better but waxing and waning. ID, neurology, neurosurgery and CCM consulted during hospital course. Care transferred to stepdown and [place] on 01/09/17. MS slowly improving. Cor Trak discontinued 6/12 and diet initiated. Transferred to medical bed 6/13.Acute kidney injury on 6/14 (creatinine 2.8), likely due to acute urinary retention & Amphotericin> resolved. Antimicrobials per ID  Assessment & Plan:   Principal Problem:   Perirectal abscess Active Problems:   Chronic pain   AIDS (acquired immune deficiency syndrome) (HCC)   Hyponatremia   Normocytic  anemia   Elevated LFTs   Pulmonary infiltrates   Hypomagnesemia   Encephalopathy acute   SIRS (systemic inflammatory response syndrome) (HCC)   Acute respiratory failure (HCC)   Rectal abscess   Cryptococcal meningoencephalitis (HCC)   Cryptococcosis (HCC)   Cavitary pneumonia   Diffuse lymphadenopathy   Drug rash   Elevated intracranial pressure   HIV disease (HCC)   Lymphadenopathy of head and neck   HIV (human immunodeficiency virus infection) ([place])   Meningitis   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 ID following. Lumbar drain removed 6/10. Repeat CSF culture negative and final. As per ID input 6/18, will finish out 28 days of amphotericin and flucytosine with plans to switch to maintenance fluconazole 400 daily which should be done in the next 24 hours,  Acute urinary retention:  - Foley catheter had been removed but had to be reinserted on 6/14 yielding 1.2 L right away. Unclear etiology. Will attempt another voiding trial today.   Acute kidney injury:  -Creatinine went up from 1.06 > 2.84. Secondary to Acute Urinary Retention and amphotericin. Acute kidney injury resolved, creatinine is improving it is 0.89  Acute hypoxic respiratory failure secondary to cryptococcal pneumonia:  - Extubated 6/5. Resolved.  Acute encephalopathy:  - Multifactorial >cryptococcal meningitis, acute strokes, metabolic derangements, medication effect and respiratory failure. Has been slowly improving, but not back to baseline, not clear if he'll ever be back to his baseline given significant strokes he had, this was discussed with mother today  Acute ischemic stroke:  -  Due to small vessel involvement from his cryptococcal infection. As per neurology, continue with antifungal treatment  and no role for antiplatelet agents or statins in this setting.   HIV/AIDS:  - CD4 count this admission 21 and high viral load >1 million. Infectious disease following. Continue prophylaxis with Bactrim and azithromycin. ART not initiated due to high risk for IRIS  Perirectal abscess: Status post I&D. Twice a day dressing changes. WOC following.  Genital herpes: Completed valacyclovir.  Hypomagnesemia: Replace & follow as needed. Pharmacy managing.  Hyponatremia/? SIADH: Sodium is drifting down gradually, will place on fluid restrictions  Diarrhea: Stool culture negative. Resolved.  Anemia of chronic disease: Hb slowly dropping> no bleeding. ? D/T Flucytosine. Hemoglobin down to 7 g on 6/18. Transfuse 1 unit PRBC. I was unable to reach patient's mother to discuss or consent. Discussed with RN to try to consent overnight.  Dysphagia: Cor Trak removed a few days ago. Oral intake has gradually improved.   Elevated blood pressure: When necessary IV hydralazine and monitor.  Thrombocytopenia: Unclear etiology.? Medication effect/Flucytosine. Stable. No bleeding reported. Stable.Facial swelling: I did not notice this but ID did later in the day.? Related to hypoalbuminemia and positioning and if worsens repeat noncontrasted head CT.  Hypomagnesemia - Repleted, recheck in a.m.  Constipation - We'll start on bowel regimen   DVT prophylaxis: Subcutaneous heparin Code Status: Full Family Communication: Discussed with mother at bedside Disposition: Transferred from stepdown unit to telemetry on 6/13. DC to SNF when improved> Rehab did not feel appropriate for CIR.   Consultants:  CCM-signed off Infectious disease Neurology Neurosurgery   Procedures:  OETT 8.0 5/28 - 6/5 OGT 5/28 - 6/5 Lumbar Drain 5/28 >>DC'ed 6/10 Foley 5/28 >> 6/12, 6/14 > PIV  Cor trak> discontinued 6/12   Antimicrobials:  Triumeq 5/23 - 5/24 Flagyl 5/23 - 5/24 Vancomycin 5/23 - 5/24 Doxycycline 5/24 -  5/25 Rocephin 5/23 - 5/30 Augmentin 5/24 - 5/25 Bactrim 5/23 >> Flucytosine 5/24 >> Valtrex 5/24 >> 6/3  Azithromycin qweek 5/24 >> Ambisome 5/24 >>   MICROBIOLOGY: MRSA PCR 5/23:  Negative  Blood Cultures x2 5/22:  2/2 Bottles Positive Cryptococcus neoformans  Blood Cultures x2 5/23:  2/2 Bottles Positive Cryptococcus neoformans  Wound Culture 5/23:  Multiple organisms present CSF Culture 5/24:  Abundant Cryptococcus neoformans  Quantiferon TB 5/26:  Negative  CSF Culture 5/27:  Cryptococcus neoformans  BAL RML 5/28:  Cryptococcus neoformans / AFB smear negative & culture pending Tracheal Aspirate AFB 5/30 >>> Smear negative / Ctx pending Blood Cultures x2 5/30:  Negative  Stool Culture 5/31:  Negative  Stool Gastrointestinal Panel 5/31:  Negative Stool C diff 5/31:  Negative  CSF Culture 6/1 >> Encapsulated yeast seen   Subjective: Patient denies any chest pain, shortness of breath or cough, but reports some generalized pain   Objective:  Vitals:   01/17/17 0532 01/17/17 1408 01/17/17 2146 01/18/17 0647  BP: 120/74 (!) 151/91 127/77 140/84  Pulse: 89 82 95 84  Resp: _0 Temp: 99.4 F (37.4 C) 98.3 F (36.8 C) 98.4 F (36.9 C) 99.6 F (37.6 C)  TempSrc:  Oral    SpO2: 99% 99% 100% 100%  Weight:      Height:        Examination:  General exam: Pleasant young male, well built and nourished, lying comfortably propped up in bed. Does not appear in any distress.A shunt with some minimal left-sided facial swelling, but no erythema or tenderness to palpation  Respiratory system: Good air entry bilaterally,Clear to auscultation, negative accessory muscle  Cardiovascular system: S1 and S2 heard, regular rate and rhythm, no rubs murmurs gallops, no JVD Gastrointestinal system: Soft, nontender, nondistended, bowel sounds present . Foley+. Stable without change Central nervous system: Patient is sluggish to react, answers simple questions, no focal neurological  deficits . Extremities: less rigidity of upper extremities and moves to command. No focal deficits.  Skin: Extensive tattooing of his body. Psychiatry: Judgement and insight impaired. Mood & affect : Flat.    Data Reviewed: I have personally reviewed following labs and imaging studies  CBC:  Recent Labs Lab 01/13/17 0530 01/14/17 0633 01/15/17 0659 01/16/17 0640 01/17/17 0630  WBC 5.6 5.1 5.4 4.9 5.5  NEUTROABS  --   --   --  3.8  --   HGB 7.8* 7.7* 7.2* 7.0* 8.0*  HCT 24.3* 24.3* 22.4* 22.2* 24.5*  MCV 84.4 86.5 85.8 84.4 86.0  PLT 126* 125* 111* 105* 92*   Basic Metabolic Panel:  Recent Labs Lab 01/14/17 0633 01/15/17 0659 01/16/17 0640 01/17/17 0630 01/18/17 0758  NA 139 137 134*  133* 131* 132*  K 3.9 4.2 4.0  4.0 3.7 3.9  CL 113* 112* 109  109 106 108  CO2 20* 19* 19*  18* 20* 20*  GLUCOSE 90 91 93  92 92 92  BUN 33* 25* _0 CREATININE 1.49* 1.19 1.02  1.03 0.89 0.83  CALCIUM 9.3 8.6* 8.4*  8.4* 8.2* 8.1*  MG 1.4* 1.4* 1.5* 1.7 1.1*  PHOS 5.1* 4.7* 4.7* 4.5 3.7   Liver Function Tests:  Recent Labs Lab 01/14/17 8756 01/15/17 0659 01/16/17 0640 01/17/17 0630 01/18/17 0758  ALBUMIN 2.5* 2.5* 2.2* 2.2* 2.1*   Coagulation Profile: No results for input(s): INR, PROTIME in the last 168 hours. CBG:  Recent Labs Lab 01/11/17 1634  GLUCAP 115*    No results found for this or any previous visit (from the past 240 hour(s)).       Radiology Studies: No results found.      Scheduled Meds: . amantadine  100 mg Oral BID WC  . azithromycin  1,200 mg Oral Weekly  . feeding supplement (ENSURE ENLIVE)  237 mL Oral TID BM  . flucytosine  1,500 mg Oral Q6H  . heparin subcutaneous  5,000 Units Subcutaneous Q8H  . magnesium oxide  400 mg Oral BID  . mouth rinse  15 mL Mouth Rinse BID  . sodium chloride flush  3 mL Intravenous Q12H  . sulfamethoxazole-trimethoprim  1 tablet Oral Daily   Continuous Infusions: . sodium chloride 50  mL/hr at 01/17/17 1901  . sodium chloride Stopped (01/18/17 0416)   And  . sodium chloride Stopped (01/17/17 1916)   And  . amphotericin  B  Liposome (AMBISOME) ADULT IV Stopped (01/17/17 2140)     LOS: 28 days     Cambrey Lupi, MD,  [facility] Pager [phone]  If 7PM-7AM, please contact night-coverage [email or web address] Password [email or web address] 01/18/2017, 1:17 PM

## 2017-01-19 LAB — CBC
HEMATOCRIT: 25.2 % — AB (ref 39.0–52.0)
HEMOGLOBIN: 8.1 g/dL — AB (ref 13.0–17.0)
MCH: 27.7 pg (ref 26.0–34.0)
MCHC: 32.1 g/dL (ref 30.0–36.0)
MCV: 86.3 fL (ref 78.0–100.0)
Platelets: 122 10*3/uL — ABNORMAL LOW (ref 150–400)
RBC: 2.92 MIL/uL — ABNORMAL LOW (ref 4.22–5.81)
RDW: 17.4 % — AB (ref 11.5–15.5)
WBC: 4.9 10*3/uL (ref 4.0–10.5)

## 2017-01-19 LAB — RENAL FUNCTION PANEL
Albumin: 2.2 g/dL — ABNORMAL LOW (ref 3.5–5.0)
Anion gap: 5 (ref 5–15)
BUN: 14 mg/dL (ref 6–20)
CHLORIDE: 107 mmol/L (ref 101–111)
CO2: 19 mmol/L — AB (ref 22–32)
Calcium: 8.4 mg/dL — ABNORMAL LOW (ref 8.9–10.3)
Creatinine, Ser: 1.06 mg/dL (ref 0.61–1.24)
GFR calc Af Amer: >60 mL/min (ref >60)
GFR calc non Af Amer: >60 mL/min (ref >60)
GLUCOSE: 97 mg/dL (ref 65–99)
POTASSIUM: 3.7 mmol/L (ref 3.5–5.1)
Phosphorus: 4.3 mg/dL (ref 2.5–4.6)
Sodium: 131 mmol/L — ABNORMAL LOW (ref 135–145)

## 2017-01-19 LAB — MAGNESIUM: Magnesium: 1.5 mg/dL — ABNORMAL LOW (ref 1.7–2.4)

## 2017-01-19 MED ORDER — ADULT MULTIVITAMIN W/MINERALS CH
1.0000 | ORAL_TABLET | Freq: Every day | ORAL | Status: DC
  Administered 2017-01-19 – 2017-02-06 (×19): 1 via ORAL
  Filled 2017-01-19 (×19): qty 1

## 2017-01-19 MED ORDER — TAMSULOSIN HCL 0.4 MG PO CAPS
0.4000 mg | ORAL_CAPSULE | Freq: Every day | ORAL | Status: DC
  Administered 2017-01-19 – 2017-02-06 (×19): 0.4 mg via ORAL
  Filled 2017-01-19 (×19): qty 1

## 2017-01-19 MED ORDER — FLUCONAZOLE IN SODIUM CHLORIDE 400-0.9 MG/200ML-% IV SOLN
400.0000 mg | INTRAVENOUS | Status: DC
  Administered 2017-01-19 – 2017-01-23 (×5): 400 mg via INTRAVENOUS
  Filled 2017-01-19 (×6): qty 200

## 2017-01-19 MED ORDER — MAGNESIUM SULFATE 4 GM/100ML IV SOLN
4.0000 g | Freq: Once | INTRAVENOUS | Status: AC
  Administered 2017-01-19: 4 g via INTRAVENOUS
  Filled 2017-01-19: qty 100

## 2017-01-19 MED ORDER — POTASSIUM CHLORIDE CRYS ER 20 MEQ PO TBCR
40.0000 meq | EXTENDED_RELEASE_TABLET | Freq: Once | ORAL | Status: AC
  Administered 2017-01-19: 40 meq via ORAL
  Filled 2017-01-19: qty 2

## 2017-01-19 MED ORDER — TAMSULOSIN HCL 0.4 MG PO CAPS: 0.4000 mg | ORAL_CAPSULE | Freq: Every day | ORAL | Status: DC

## 2017-01-19 NOTE — Progress Notes (Signed)
Patient c/o abdominal discomfort and abdomen tight and distended. I &O cath done 900 cc of clear urine out. Patient had some relief no complications at this time his RN will continue to monitor.

## 2017-01-19 NOTE — Progress Notes (Signed)
Nutrition Follow-up  DOCUMENTATION CODES:   Not applicable  INTERVENTION:   -Continue Ensure Enlive po TID, each supplement provides 350 kcal and 20 grams of protein -MVI daily  NUTRITION DIAGNOSIS:   Increased nutrient needs related to wound healing as evidenced by estimated needs.  Ongoing  GOAL:   Patient will meet greater than or equal to 90% of their needs  Progressing  MONITOR:   PO intake, Supplement acceptance, Labs, Skin  REASON FOR ASSESSMENT:   Consult Calorie Count  ASSESSMENT:   Pt with PMH of HIV/AIDS (hx of noncompliance in part due to incarceration) and chronic perirectal abscess which pt had I&D Dec 2017, Jan 2018 but has worsened since being in jail for the last 6 weeks also without HIV meds during this time. Pt admitted with perirectal abscess had repeat I&D 5/23. 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 Lumbar drain placed 5/29.    Pt sleeping soundly at time of visit; RD did not disturb. No family present.   6/19- pt upgraded to dysphagia 3 diet with nectar thick liquids  Both intake and mentation improved. Noted 25-50% meal completion. Breakfast tray of biscuit and gravy and ground sausage was unattempted. Pt consumed about 50% of liquids on tray. Per MAR, pt is also consuming Ensure supplements.   Labs reviewed: Na: 131, Mg: 1.5.   Diet Order:  DIET DYS 3 Room service appropriate? Yes; Fluid consistency: Nectar Thick; Fluid restriction: 1500 mL Fluid  Skin:  Wound (see comment) (st 2 sacrum, opwn buttock wound, closed back and perineum )  Last BM:  01/19/17  Height:   Ht Readings from Last 1 Encounters:  12/27/16 5\' 6"  (1.676 m)    Weight:   Wt Readings from Last 1 Encounters:  01/11/17 136 lb 11 oz (62 kg)    Ideal Body Weight:  59 kg  BMI:  Body mass index is 22.06 kg/m.  Estimated Nutritional Needs:   Kcal:  1800-2000  Protein:  90-110 grams  Fluid:  > 1.8 L/day  EDUCATION NEEDS:   No  education needs identified at this time  [place] A. Jimmye Norman, RD, LDN, CDE Pager: [phone] After hours Pager: [phone]

## 2017-01-19 NOTE — Progress Notes (Addendum)
PROGRESS NOTE   William Pena  YQI:[id number]    DOB: [birth year]    DOA: 12/20/2016  PCP: [name], MD   I have briefly reviewed patients previous medical records in [place].  Brief Narrative:  31 year old male with a PMH of HIV/AIDS (last CD4 on 12/21/16:21), just started back on ART 2 months PTA, however he was incarcerated and reported that he was not getting his medications, admitted on 5/22 for perirectal abscess. During admission he was also noted to have headache, fever, chills, night sweats, neck pain and lymphadenopathy. ID was consulted and recommended imaging of chest, LP and cultures be sent from perirectal I&D. Also his serum cryptococcal antigen titer was elevated raising the possibility of disseminated cryptococcal infection including lung involvement. Pulmonology was consulted by ID for bronchoscopy and washings in an effort to help determine if his CT changes reflect TB or possible pulmonary cryptococcal disease. Initially unable to perform bronchoscopy due to hypoxia. 5/28 patient had altered LOC, became very lethargic/obtunded requiring CT and LP, complicated by acute respiratory failure due to altered mental status that required mechanical ventilation. He had a lumbar CSF drain to depression arise. Now extubated, drain removed. Failed swallowing evaluation and has Cor trak forte tube feeds. Mental status slightly better but waxing and waning. ID, neurology, neurosurgery and CCM consulted during hospital course. Care transferred to stepdown and [place] on 01/09/17. MS slowly improving. Cor Trak discontinued 6/12 and diet initiated. Transferred to medical bed 6/13.Acute kidney injury on 6/14 (creatinine 2.8), likely due to acute urinary retention & Amphotericin> resolved. Antimicrobials per ID  Assessment & Plan:   Principal Problem:   Perirectal abscess Active Problems:   Chronic pain   AIDS (acquired immune deficiency syndrome) (HCC)   Hyponatremia   Normocytic  anemia   Elevated LFTs   Pulmonary infiltrates   Hypomagnesemia   Encephalopathy acute   SIRS (systemic inflammatory response syndrome) (HCC)   Acute respiratory failure (HCC)   Rectal abscess   Cryptococcal meningoencephalitis (HCC)   Cryptococcosis (HCC)   Cavitary pneumonia   Diffuse lymphadenopathy   Drug rash   Elevated intracranial pressure   HIV disease (HCC)   Lymphadenopathy of head and neck   HIV (human immunodeficiency virus infection) ([place])   Meningitis   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 ID following. Lumbar drain removed 6/10. Repeat CSF culture negative and final. As per ID input 6/18, will finish out 28 days of amphotericin and flucytosine with plans to switch to maintenance fluconazole 400 daily after that .  Acute urinary retention:  - Foley catheter had been removed but had to be reinserted on 6/14 yielding 1.2 L right away. Unclear etiology. Foley catheter was discontinued yesterday, and so far is no evidence of retention   Acute kidney injury:  -Creatinine went up from 1.06 > 2.84. Secondary to Acute Urinary Retention and amphotericin. Acute kidney injury resolved,   Acute hypoxic respiratory failure secondary to cryptococcal pneumonia:  - Extubated 6/5. Resolved.  Acute encephalopathy:  - Multifactorial >cryptococcal meningitis, acute strokes, metabolic derangements, medication effect and respiratory failure. Has been slowly improving, but not back to baseline, not clear if he'll ever be back to his baseline given significant strokes he had, this was discussed with mother   Acute ischemic stroke:  - Due to small vessel  involvement from his cryptococcal infection. As per neurology, continue with antifungal treatment and no role for antiplatelet  agents or statins in this setting.   HIV/AIDS:  - CD4 count this admission 21 and high viral load >1 million. Infectious disease following. Continue prophylaxis with Bactrim and azithromycin. ART not initiated due to high risk for IRIS  Perirectal abscess: Status post I&D. Twice a day dressing changes. WOC following.  Genital herpes: Completed valacyclovir.  Hypomagnesemia: Replace & follow as needed. Pharmacy managing.  Hyponatremia/? SIADH: Sodium trending down gradually, this is most likely related to SIADH, reinstituted fluid restriction 1.5 L, sodium is stable at 131 today  Diarrhea: Stool culture negative. Resolved.  Anemia of chronic disease: Hb slowly dropping> no bleeding. ? D/T Flucytosine. Transfuse for hemoglobin less than 7  Dysphagia: Cor Trak removed a few days ago. Oral intake has gradually improved.   Elevated blood pressure: When necessary IV hydralazine and monitor.  Thrombocytopenia: Unclear etiology.? Medication effect/Flucytosine. Stable. No bleeding reported.   Facial swelling: It appears to be improving, will monitor closely I did not notice this but ID did later in the day.? Related to hypoalbuminemia and positioning and if worsens repeat noncontrasted head CT.  Hypomagnesemia - There is 1.5 today, monitor closely and replete as patient is on amphotericin, received 4 g today  Constipation -  started on bowel regimen   DVT prophylaxis: Subcutaneous heparin Code Status: Full Family Communication: Discussed with mother at bedside 6/20 Disposition:  DC to SNF when improved> Rehab did not feel appropriate for CIR.   Consultants:  CCM-signed off Infectious disease Neurology Neurosurgery   Procedures:  OETT 8.0 5/28 - 6/5 OGT 5/28 - 6/5 Lumbar Drain 5/28 >>DC'ed 6/10 Foley 5/28 >> 6/12, 6/14 > PIV  Cor trak> discontinued 6/12   Antimicrobials:  Triumeq 5/23 - 5/24 Flagyl 5/23 - 5/24 Vancomycin 5/23 - 5/24 Doxycycline 5/24 - 5/25 Rocephin 5/23  - 5/30 Augmentin 5/24 - 5/25 Bactrim 5/23 >> Flucytosine 5/24 >> Valtrex 5/24 >> 6/3  Azithromycin qweek 5/24 >> Ambisome 5/24 >>   MICROBIOLOGY: MRSA PCR 5/23:  Negative  Blood Cultures x2 5/22:  2/2 Bottles Positive Cryptococcus neoformans  Blood Cultures x2 5/23:  2/2 Bottles Positive Cryptococcus neoformans  Wound Culture 5/23:  Multiple organisms present CSF Culture 5/24:  Abundant Cryptococcus neoformans  Quantiferon TB 5/26:  Negative  CSF Culture 5/27:  Cryptococcus neoformans  BAL RML 5/28:  Cryptococcus neoformans / AFB smear negative & culture pending Tracheal Aspirate AFB 5/30 >>> Smear negative / Ctx pending Blood Cultures x2 5/30:  Negative  Stool Culture 5/31:  Negative  Stool Gastrointestinal Panel 5/31:  Negative Stool C diff 5/31:  Negative  CSF Culture 6/1 >> Encapsulated yeast seen   Subjective: Patient denies chest pain, shortness of breath, cough, reports she is feeling better today .  Objective:  Vitals:   01/17/17 2146 01/18/17 0647 01/18/17 2348 01/19/17 0545  BP: 127/77 140/84 120/75 135/83  Pulse: 95 84 86 78  Resp: _0 Temp: 98.4 F (36.9 C) 99.6 F (37.6 C) 98.9 F (37.2 C) 98.6 F (37 C)  TempSrc:   Oral Oral  SpO2: 100% 100% 100% 100%  Weight:      Height:        Examination:  General exam: Pleasant young male, well built and nourished, lying comfortably propped up in bed. Does not appear in any distress.Minimal left facial swelling , improved  Respiratory system: Good air entry bilaterally,Clear to auscultation, negative  accessory muscle , stable Cardiovascular system: S1 and S2 heard, regular rate and rhythm, no rubs murmurs gallops, no JVD, stable Gastrointestinal system: Soft, nontender, nondistended, bowel sounds present .Stable without change, Foley discontinued Central nervous system: Patient is sluggish to react, answers simple questions, no focal neurological deficits . Extremities: less rigidity of upper  extremities and moves to command. No focal deficits.  Skin: Extensive tattooing of his body. Psychiatry: Judgement and insight improving. Mood & affect : Flat.    Data Reviewed: I have personally reviewed following labs and imaging studies  CBC:  Recent Labs Lab 01/14/17 0633 01/15/17 0659 01/16/17 0640 01/17/17 0630 01/19/17 0618  WBC 5.1 5.4 4.9 5.5 4.9  NEUTROABS  --   --  3.8  --   --   HGB 7.7* 7.2* 7.0* 8.0* 8.1*  HCT 24.3* 22.4* 22.2* 24.5* 25.2*  MCV 86.5 85.8 84.4 86.0 86.3  PLT 125* 111* 105* 92* 767*   Basic Metabolic Panel:  Recent Labs Lab 01/15/17 0659 01/16/17 0640 01/17/17 0630 01/18/17 0758 01/19/17 0618  NA 137 134*  133* 131* 132* 131*  K 4.2 4.0  4.0 3.7 3.9 3.7  CL 112* 109  109 106 108 107  CO2 19* 19*  18* 20* 20* 19*  GLUCOSE 91 93  92 92 92 97  BUN 25* _0 CREATININE 1.19 1.02  1.03 0.89 0.83 1.06  CALCIUM 8.6* 8.4*  8.4* 8.2* 8.1* 8.4*  MG 1.4* 1.5* 1.7 1.1* 1.5*  PHOS 4.7* 4.7* 4.5 3.7 4.3   Liver Function Tests:  Recent Labs Lab 01/15/17 0659 01/16/17 0640 01/17/17 0630 01/18/17 0758 01/19/17 0618  ALBUMIN 2.5* 2.2* 2.2* 2.1* 2.2*   Coagulation Profile: No results for input(s): INR, PROTIME in the last 168 hours. CBG: No results for input(s): GLUCAP in the last 168 hours.  No results found for this or any previous visit (from the past 240 hour(s)).       Radiology Studies: No results found.      Scheduled Meds: . amantadine  100 mg Oral BID WC  . azithromycin  1,200 mg Oral Weekly  . feeding supplement (ENSURE ENLIVE)  237 mL Oral TID BM  . flucytosine  1,500 mg Oral Q6H  . heparin subcutaneous  5,000 Units Subcutaneous Q8H  . magnesium oxide  400 mg Oral BID  . mouth rinse  15 mL Mouth Rinse BID  . multivitamin with minerals  1 tablet Oral Daily  . polyethylene glycol  17 g Oral BID  . senna-docusate  2 tablet Oral BID  . sodium chloride flush  3 mL Intravenous Q12H  .  sulfamethoxazole-trimethoprim  1 tablet Oral Daily   Continuous Infusions: . sodium chloride 50 mL/hr at 01/19/17 0143  . sodium chloride Stopped (01/19/17 0026)   And  . sodium chloride Stopped (01/18/17 1751)   And  . amphotericin  B  Liposome (AMBISOME) ADULT IV Stopped (01/18/17 2021)     LOS: 29 days     Zyrah Wiswell, MD,  [facility] Pager [phone]  If 7PM-7AM, please contact night-coverage [email or web address] Password [email or web address] 01/19/2017, 2:13 PM

## 2017-01-19 NOTE — Progress Notes (Signed)
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 Pharmacy consulted to manage electrolytes.  Scr bumped up slightly today from 0.83 to 1.06. K is 3.7 and Mg is low at 1.5.    Plan: Magnesium sulfate 4g IV x 1 Continue Mag 400mg  PO BID Potassium 81meq PO x 1 Continue to follow electrolytes, renal function, and ID plans closely  Salome Arnt, PharmD, BCPS Pager # [phone] 01/19/2017 10:30 AM

## 2017-01-19 NOTE — Progress Notes (Signed)
   ID PROGRESS NOTE:  - we will plan to switch to fluconazole 400mg  daily (Iv for now until he has good oral intake_ - his fungal cx on 6/1 states that it has fungal elements. We need repeat CSF culture (aerobic and funga) cell count with gram stain to document he has cleared his infection - we will stop ampho and flucytosine today

## 2017-01-20 ENCOUNTER — Encounter: Payer: Self-pay | Admitting: Infectious Diseases

## 2017-01-20 LAB — PROTEIN AND GLUCOSE, CSF
Glucose, CSF: 55 mg/dL (ref 40–70)
TOTAL PROTEIN, CSF: 94 mg/dL — AB (ref 15–45)

## 2017-01-20 LAB — HEPATIC FUNCTION PANEL
ALBUMIN: 2.1 g/dL — AB (ref 3.5–5.0)
ALT: 41 U/L (ref 17–63)
AST: 45 U/L — ABNORMAL HIGH (ref 15–41)
Alkaline Phosphatase: 63 U/L (ref 38–126)
Bilirubin, Direct: 0.1 mg/dL (ref 0.1–0.5)
Indirect Bilirubin: 0.4 mg/dL (ref 0.3–0.9)
TOTAL PROTEIN: 8.5 g/dL — AB (ref 6.5–8.1)
Total Bilirubin: 0.5 mg/dL (ref 0.3–1.2)

## 2017-01-20 LAB — CSF CELL COUNT WITH DIFFERENTIAL
EOS CSF: 0 % (ref 0–1)
LYMPHS CSF: 91 % — AB (ref 40–80)
MONOCYTE-MACROPHAGE-SPINAL FLUID: 9 % — AB (ref 15–45)
OTHER CELLS CSF: 0
RBC Count, CSF: 1 /mm3 — ABNORMAL HIGH
Segmented Neutrophils-CSF: 0 % (ref 0–6)
Tube #: 1
WBC, CSF: 12 /mm3 (ref 0–5)

## 2017-01-20 LAB — RENAL FUNCTION PANEL
ALBUMIN: 2.1 g/dL — AB (ref 3.5–5.0)
Anion gap: 4 — ABNORMAL LOW (ref 5–15)
BUN: 17 mg/dL (ref 6–20)
CALCIUM: 8.9 mg/dL (ref 8.9–10.3)
CHLORIDE: 114 mmol/L — AB (ref 101–111)
CO2: 21 mmol/L — ABNORMAL LOW (ref 22–32)
CREATININE: 1.14 mg/dL (ref 0.61–1.24)
GFR calc Af Amer: >60 mL/min (ref >60)
Glucose, Bld: 90 mg/dL (ref 65–99)
Phosphorus: 5.5 mg/dL — ABNORMAL HIGH (ref 2.5–4.6)
Potassium: 4.1 mmol/L (ref 3.5–5.1)
SODIUM: 139 mmol/L (ref 135–145)

## 2017-01-20 LAB — MAGNESIUM: Magnesium: 1.8 mg/dL (ref 1.7–2.4)

## 2017-01-20 MED ORDER — BOOST / RESOURCE BREEZE PO LIQD
1.0000 | Freq: Three times a day (TID) | ORAL | Status: DC
  Administered 2017-01-21 – 2017-01-27 (×18): 1 via ORAL

## 2017-01-20 NOTE — Progress Notes (Signed)
[facility] for Infectious Disease    Date of Admission:  12/20/2016   Total days of antibiotics 30        Day 2 fluconazole        Day 26 oi proph   ID: William Pena is a 31 y.o. male with advanced hiv disease, disseminated CM Principal Problem:   Perirectal abscess Active Problems:   Chronic pain   AIDS (acquired immune deficiency syndrome) (HCC)   Hyponatremia   Normocytic anemia   Elevated LFTs   Pulmonary infiltrates   Hypomagnesemia   Encephalopathy acute   SIRS (systemic inflammatory response syndrome) (HCC)   Acute respiratory failure (HCC)   Rectal abscess   Cryptococcal meningoencephalitis (HCC)   Cryptococcosis (HCC)   Cavitary pneumonia   Diffuse lymphadenopathy   Drug rash   Elevated intracranial pressure   HIV disease (HCC)   Lymphadenopathy of head and neck   HIV (human immunodeficiency virus infection) ([place])   Meningitis   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 Eating alittle bit per his mother's report. He reported having headache   Underwent LP - which showed OP of 35. (last tap was 6/7)  Medications:  . amantadine  100 mg Oral BID WC  . azithromycin  1,200 mg Oral Weekly  . feeding supplement (ENSURE ENLIVE)  237 mL Oral TID BM  . magnesium oxide  400 mg Oral BID  . mouth rinse  15 mL Mouth Rinse BID  . multivitamin with minerals  1 tablet Oral Daily  . polyethylene glycol  17 g Oral BID  . senna-docusate  2 tablet Oral BID  . sodium chloride flush  3 mL Intravenous Q12H  . sulfamethoxazole-trimethoprim  1 tablet Oral Daily  . tamsulosin  0.4 mg Oral Daily    Objective: Vital signs in last 24 hours: Temp:  [98.2 F (36.8 C)] 98.2 F (36.8 C) (06/21 2346) Pulse Rate:  [98] 98 (06/21 2346) Resp:  [18] 18 (06/21 2346) BP: (111)/(68) 111/68 (06/21 2346) SpO2:  [98  %] 98 % (06/21 2346) Physical Exam  Constitutional: [name] sleeping. Easily awakens. He appears well-developed and well-nourished. No distress.  HENT: no conjunctivities Mouth/Throat: Oropharynx is clear and moist. No oropharyngeal exudate.  Abdominal: Soft. Bowel sounds are normal. He exhibits no distension. There is no tenderness.  Skin: Skin is warm and dry. No rash noted. No erythema.   Lab Results  Recent Labs  01/19/17 0618 01/20/17 0510  WBC 4.9  --   HGB 8.1*  --   HCT 25.2*  --   NA 131* 139  K 3.7 4.1  CL 107 114*  CO2 19* 21*  BUN 14 17  CREATININE 1.06 1.14   Liver Panel  Recent Labs  01/19/17 0618 01/20/17 0510  ALBUMIN 2.2* 2.1*    Microbiology: 6/7 csf no growth, but yeast on gram stain Studies/Results: No results found.   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 Will check hepatic panel. Fungal cx on 6/1 shows +fungal elements on gram stain but cx stil negative  High opening pressure on LP = plan to recheck early next week, if still elevated. We will need VP shunt.  Medication monitoring = kidney function is back to normal. Replete electrolytes per pharmacy  oi proph =  continue on bactrim daily plus weekly azithromycin  hiv disease= holding off initiation of ART due to high risk for IRIS, plan to initiate next 1-2 weeks  Malnutrition = encourse to drink 3 ensure enlive TID    [facility], [facility] for Infectious Diseases Cell: [phone] Pager: [phone]  01/20/2017, 12:08 PM

## 2017-01-20 NOTE — Progress Notes (Signed)
CRITICAL VALUE ALERT  Critical Value:  CSF WBC 12  Date & Time Notied:  01/20/17 at 1300  Provider Notified: MD Elgerawy  Orders Received/Actions taken: none

## 2017-01-20 NOTE — Progress Notes (Addendum)
PROGRESS NOTE   William Pena  YBO:[id number]    DOB: 30-Sep-1985    DOA: 12/20/2016  PCP: [name], MD   I have briefly reviewed patients previous medical records in [place].  Brief Narrative:  31 year old male with a PMH of HIV/AIDS (last CD4 on 12/21/16:21), just started back on ART 2 months PTA, however he was incarcerated and reported that he was not getting his medications, admitted on 5/22 for perirectal abscess. During admission he was also noted to have headache, fever, chills, night sweats, neck pain and lymphadenopathy. ID was consulted and recommended imaging of chest, LP and cultures be sent from perirectal I&D. Also his serum cryptococcal antigen titer was elevated raising the possibility of disseminated cryptococcal infection including lung involvement. Pulmonology was consulted by ID for bronchoscopy and washings in an effort to help determine if his CT changes reflect TB or possible pulmonary cryptococcal disease. Initially unable to perform bronchoscopy due to hypoxia. 5/28 patient had altered LOC, became very lethargic/obtunded requiring CT and LP, complicated by acute respiratory failure due to altered mental status that required mechanical ventilation. He had a lumbar CSF drain to depression arise. Now extubated, drain removed. Failed swallowing evaluation and has Cor trak forte tube feeds. Mental status slightly better but waxing and waning. ID, neurology, neurosurgery and CCM consulted during hospital course. Care transferred to stepdown and [place] on 01/09/17. MS slowly improving. Cor Trak discontinued 6/12 and diet initiated. Transferred to medical bed 6/13.Acute kidney injury on 6/14 (creatinine 2.8), likely due to acute urinary retention & Amphotericin> resolved. Antimicrobials per ID  Assessment & Plan:   Principal Problem:   Perirectal abscess Active Problems:   Chronic pain   AIDS (acquired immune deficiency syndrome) (HCC)   Hyponatremia   Normocytic  anemia   Elevated LFTs   Pulmonary infiltrates   Hypomagnesemia   Encephalopathy acute   SIRS (systemic inflammatory response syndrome) (HCC)   Acute respiratory failure (HCC)   Rectal abscess   Cryptococcal meningoencephalitis (HCC)   Cryptococcosis (HCC)   Cavitary pneumonia   Diffuse lymphadenopathy   Drug rash   Elevated intracranial pressure   HIV disease (HCC)   Lymphadenopathy of head and neck   HIV (human immunodeficiency virus infection) ([place])   Meningitis   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 Antimicrobials management per ID, finish total of 28 days of amphotericin and flucytosine, currently transitioned to oral Diflucan on 6/22 . - Patient with increased ICP secondary to cryptococcal meningitis, requiring lumbar drain, has been discontinued, repeat LP today showing increased opening pressure at 30, unstained cells significant for yeast , discussed with ID, likely will need another LP in few days , and may end up needing a shunt .  Acute urinary retention:  - Patient with evidence of urinary retention, attempted voiding trials 2, unfortunately failed, Foley reinserted 6/22  - Cont with Flomax n   Acute kidney injury:  -Creatinine went up from 1.06 > 2.84. Secondary to Acute Urinary Retention and amphotericin. Acute kidney injury resolved,   Acute hypoxic respiratory failure secondary to cryptococcal pneumonia:  - Extubated 6/5. Resolved.  Acute encephalopathy:  - Multifactorial >cryptococcal meningitis, acute strokes, metabolic derangements, medication effect and respiratory failure. Has been slowly improving, but not back to baseline, not clear if he'll ever be back to his  baseline given significant strokes he had, this was discussed with mother   Acute  ischemic stroke:  - Due to small vessel involvement from his cryptococcal infection. As per neurology, continue with antifungal treatment and no role for antiplatelet agents or statins in this setting.   HIV/AIDS:  - CD4 count this admission 21 and high viral load >1 million. Infectious disease following. Continue prophylaxis with Bactrim and azithromycin. ART not initiated due to high risk for IRIS  Perirectal abscess: Status post I&D. Twice a day dressing changes. WOC following.  Genital herpes: Completed valacyclovir.  Hypomagnesemia: Replace & follow as needed. Pharmacy managing.  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  Diarrhea:  - Stool culture negative. Resolved.  Anemia of chronic disease:  - Hb slowly dropping> no bleeding. ? D/T Flucytosine. Transfuse for hemoglobin less than 7  Elevated blood pressure:  - When necessary IV hydralazine and monitor.  Thrombocytopenia:  - Unclear etiology.? Medication effect/Flucytosine. Stable. No bleeding reported.   Facial swelling: It appears to be improving, will monitor closely I did not notice this but ID did later in the day.? Related to hypoalbuminemia and positioning and if worsens repeat noncontrasted head CT.  Hypomagnesemia - Monitor closely and replete as needed  Constipation -  started on bowel regimen   DVT prophylaxis: Subcutaneous heparin Code Status: Full Family Communication: Discussed with mother at bedside 6/22 Disposition:  DC to SNF when improved> still not clinically ready for discharge, to anticipate will need another few days, still dealing with increased blood pressure and possible need for another therapy, Rehab did not feel appropriate for CIR.   Consultants:  CCM-signed off Infectious disease Neurology Neurosurgery   Procedures:  OETT 8.0 5/28 - 6/5 OGT 5/28 - 6/5 Lumbar Drain 5/28 >>DC'ed 6/10 Foley 5/28 >> 6/12, 6/14 > PIV  Cor trak> discontinued  6/12   Antimicrobials:  Triumeq 5/23 - 5/24 Flagyl 5/23 - 5/24 Vancomycin 5/23 - 5/24 Doxycycline 5/24 - 5/25 Rocephin 5/23 - 5/30 Augmentin 5/24 - 5/25 Bactrim 5/23 >> Flucytosine 5/24 >> Valtrex 5/24 >> 6/3  Azithromycin qweek 5/24 >> Ambisome 5/24 >>   MICROBIOLOGY: MRSA PCR 5/23:  Negative  Blood Cultures x2 5/22:  2/2 Bottles Positive Cryptococcus neoformans  Blood Cultures x2 5/23:  2/2 Bottles Positive Cryptococcus neoformans  Wound Culture 5/23:  Multiple organisms present CSF Culture 5/24:  Abundant Cryptococcus neoformans  Quantiferon TB 5/26:  Negative  CSF Culture 5/27:  Cryptococcus neoformans  BAL RML 5/28:  Cryptococcus neoformans / AFB smear negative & culture pending Tracheal Aspirate AFB 5/30 >>> Smear negative / Ctx pending Blood Cultures x2 5/30:  Negative  Stool Culture 5/31:  Negative  Stool Gastrointestinal Panel 5/31:  Negative Stool C diff 5/31:  Negative  CSF Culture 6/1 >> Encapsulated yeast seen   Subjective: Patient is no significant events overnight, denies any chest pain, shortness of breath, cough,  Objective:  Vitals:   01/18/17 0647 01/18/17 2348 01/19/17 0545 01/19/17 2346  BP: 140/84 120/75 135/83 111/68  Pulse: 84 86 78 98  Resp: _0 Temp: 99.6 F (37.6 C) 98.9 F (37.2 C) 98.6 F (37 C) 98.2 F (36.8 C)  TempSrc:  Oral Oral   SpO2: 100% 100% 100% 98%  Weight:      Height:        Examination:  General exam: Pleasant Male, well-built, sitting in bed in no apparent distress   Respiratory system: Good air entry bilaterally, clear  to auscultation, no use of accessory muscle  Cardiovascular system: S1 and S2 heard, regular rate and rhythm, no rubs murmurs or gallops. Gastrointestinal system: Soft, nontender, nondistended, bowel sounds present  Central nervous system: Patient is sluggish to react, answers simple questions, no focal neurological deficits . Extremities: less rigidity of upper extremities and moves to  command. No focal deficits.  Skin: Extensive tattooing of his body. Psychiatry: Judgement and insight improving. Mood & affect : Flat.    Data Reviewed: I have personally reviewed following labs and imaging studies  CBC:  Recent Labs Lab 01/14/17 0633 01/15/17 0659 01/16/17 0640 01/17/17 0630 01/19/17 0618  WBC 5.1 5.4 4.9 5.5 4.9  NEUTROABS  --   --  3.8  --   --   HGB 7.7* 7.2* 7.0* 8.0* 8.1*  HCT 24.3* 22.4* 22.2* 24.5* 25.2*  MCV 86.5 85.8 84.4 86.0 86.3  PLT 125* 111* 105* 92* 591*   Basic Metabolic Panel:  Recent Labs Lab 01/16/17 0640 01/17/17 0630 01/18/17 0758 01/19/17 0618 01/20/17 0510  NA 134*  133* 131* 132* 131* 139  K 4.0  4.0 3.7 3.9 3.7 4.1  CL 109  109 106 108 107 114*  CO2 19*  18* 20* 20* 19* 21*  GLUCOSE 93  92 92 92 97 90  BUN _0 CREATININE 1.02  1.03 0.89 0.83 1.06 1.14  CALCIUM 8.4*  8.4* 8.2* 8.1* 8.4* 8.9  MG 1.5* 1.7 1.1* 1.5* 1.8  PHOS 4.7* 4.5 3.7 4.3 5.5*   Liver Function Tests:  Recent Labs Lab 01/17/17 0630 01/18/17 0758 01/19/17 0618 01/20/17 0510 01/20/17 1230  AST  --   --   --   --  45*  ALT  --   --   --   --  41  ALKPHOS  --   --   --   --  63  BILITOT  --   --   --   --  0.5  PROT  --   --   --   --  8.5*  ALBUMIN 2.2* 2.1* 2.2* 2.1* 2.1*   Coagulation Profile: No results for input(s): INR, PROTIME in the last 168 hours. CBG: No results for input(s): GLUCAP in the last 168 hours.  Recent Results (from the past 240 hour(s))  CSF culture     Status: None (Preliminary result)   Collection Time: 01/20/17 11:38 AM  Result Value Ref Range Status   Specimen Description CSF  Final   Special Requests TUBE 2  Final   Gram Stain   Final    CYTOSPIN SMEAR WBC PRESENT, PREDOMINANTLY MONONUCLEAR ENCAPSULATED YEAST SEEN CRITICAL RESULT CALLED TO, READ BACK BY AND VERIFIED WITH: Ardelia Mems RN 13:40 01/20/17 ([email or web address])    Culture PENDING  Incomplete   Report Status PENDING  Incomplete          Radiology Studies: No results found.      Scheduled Meds: . amantadine  100 mg Oral BID WC  . azithromycin  1,200 mg Oral Weekly  . feeding supplement (ENSURE ENLIVE)  237 mL Oral TID BM  . magnesium oxide  400 mg Oral BID  . mouth rinse  15 mL Mouth Rinse BID  . multivitamin with minerals  1 tablet Oral Daily  . polyethylene glycol  17 g Oral BID  . senna-docusate  2 tablet Oral BID  . sodium chloride flush  3 mL Intravenous Q12H  . sulfamethoxazole-trimethoprim  1 tablet Oral Daily  .  tamsulosin  0.4 mg Oral Daily   Continuous Infusions: . sodium chloride 50 mL/hr at 01/20/17 0132  . fluconazole (DIFLUCAN) IV Stopped (01/19/17 1741)     LOS: 2 days     Lyndi Holbein, MD,  [facility] Pager [phone]  If 7PM-7AM, please contact night-coverage [email or web address] Password [facility] 01/20/2017, 3:08 PM

## 2017-01-20 NOTE — Progress Notes (Signed)
Physical Therapy Treatment Patient Details Name: William Pena MRN: [id number] DOB: 1986-02-21 Today's Date: 01/20/2017    History of Present Illness Patient is a 31 y/o male presents with sepsis secondary to perirectal abscess s/p I&D 5/23 and disseminated cryptococcal infection including meningitis, bacteremia, and likely pneumonia. Intubated 5/28-6/5. Pt found to have acute ischemic strokes. 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 Second MRI- extension of the diffusion restriction in the left basal ganglia and right basal ganglia. s/p Lumbar drain 5/30. PMH includes advanced hiv disease    PT Comments    Pt participated well today.  Emphasis on stengthening/stretching prior to mobility, keeping cues and assist slow to give pt time to assist, bed mobility, sitting balance, standing EOB and squat pivot transfer to chair for the sitting tolerance.    Follow Up Recommendations  SNF     Equipment Recommendations  Other (comment)    Recommendations for Other Services       Precautions / Restrictions Precautions Precautions: Fall Restrictions Weight Bearing Restrictions: No    Mobility  Bed Mobility Overal bed mobility: Needs Assistance Bed Mobility: Rolling;Sidelying to Sit Rolling: Max assist Sidelying to sit: Max assist;+2 for physical assistance       General bed mobility comments: pt assisting mostly with UE's, but therapist progressing slowly with task to give pt time to react with trunk.  Transfers Overall transfer level: Needs assistance Equipment used: 2 person hand held assist Transfers: Sit to/from [employer] Sit to Stand: Max assist;Total assist;+2 physical assistance   Squat pivot transfers: +2 physical assistance;Total assist     General transfer comment: able to come up to full extension with bil knee and truncal support  Ambulation/Gait             General Gait Details:  not able yet   Stairs            Wheelchair Mobility    Modified Rankin (Stroke Patients Only) Modified Rankin (Stroke Patients Only) Pre-Morbid Rankin Score: No symptoms Modified Rankin: Severe disability     Balance Overall balance assessment: Needs assistance Sitting-balance support: Feet supported Sitting balance-Leahy Scale: Poor Sitting balance - Comments: Pt able to maintain EOB sitting ~10 mins with min/mod facilitation to trunk control and balance      Standing balance-Leahy Scale: Zero Standing balance comment: total support of 2 for full upright posture                            Cognition Arousal/Alertness: Awake/alert Behavior During Therapy: Flat affect Overall Cognitive Status: Impaired/Different from baseline                     Current Attention Level: Sustained   Following Commands: Follows one step commands inconsistently;Follows one step commands with increased time Safety/Judgement: Decreased awareness of deficits Awareness: Intellectual Problem Solving: Slow processing;Decreased initiation;Difficulty sequencing;Requires verbal cues;Requires tactile cues General Comments: slowed responses, some ?apraxias      Exercises General Exercises - Lower Extremity Heel Slides: AAROM;Strengthening;Both;Other (comment);Supine (7 reps, graduated assist) Other Exercises Other Exercises: PROM to bil LE though mobility Other Exercises: Bil elbow extention/slow stretch to ~15-20* extension lag/  AAROM to UE's during ADL tasks    General Comments General comments (skin integrity, edema, etc.): Mom present today.  Pt more participative within/between activity..  Into chair via squat pivot and placed on lift pad.      Pertinent Vitals/Pain Pain Assessment:  Faces Faces Pain Scale: Hurts even more Pain Location: back and bottom Pain Descriptors / Indicators: Aching;Discomfort;Grimacing Pain Intervention(s): Monitored during  session;Repositioned    Home Living                      Prior Function            PT Goals (current goals can now be found in the care plan section) Acute Rehab PT Goals Patient Stated Goal: to stand and walk Progress towards PT goals: Progressing toward goals    Frequency    Min 3X/week      PT Plan Current plan remains appropriate    Co-evaluation              AM-PAC PT "6 Clicks" Daily Activity  Outcome Measure  Difficulty turning over in bed (including adjusting bedclothes, sheets and blankets)?: Total Difficulty moving from lying on back to sitting on the side of the bed? : Total Difficulty sitting down on and standing up from a chair with arms (e.g., wheelchair, bedside commode, etc,.)?: Total Help needed moving to and from a bed to chair (including a wheelchair)?: Total Help needed walking in hospital room?: Total Help needed climbing 3-5 steps with a railing? : Total 6 Click Score: 6    End of Session   Activity Tolerance: Patient tolerated treatment well Patient left: in chair;with call bell/phone within reach;with chair alarm set Nurse Communication: Mobility status PT Visit Diagnosis: Muscle weakness (generalized) (M62.81);Other abnormalities of gait and mobility (R26.89);Hemiplegia and hemiparesis Hemiplegia - Right/Left: Left Hemiplegia - dominant/non-dominant: Non-dominant Hemiplegia - caused by: Cerebral infarction     Time: 1202-1240 PT Time Calculation (min) (ACUTE ONLY): 38 min  Charges:  $Therapeutic Exercise: 8-22 mins $Therapeutic Activity: 23-37 mins                    G Codes:       2017/02/04  Donnella Sham, PT [phone]  (pager)   Tessie Fass Adrionna Delcid 02/04/2017, 1:09 PM

## 2017-01-20 NOTE — Procedures (Signed)
Indication: confirmation infection has been treated  Risks of the procedure were dicussed with the patient including post-LP headache, bleeding, infection, weakness/numbness of legs(radiculopathy), death.  The patient/patient's proxy agreed and written consent was obtained.   The patient was prepped and draped, and using sterile technique a 20 gauge quinke spinal needle was inserted in the L 3/4 space. The opening pressure was 31 mm Hg. Approximately 16 cc of CSF were obtained and sent for analysis. One attempt was made to gain CSF.  William Quill PA-C [facility] [phone]  M-F  (8:30 am- 4 PM)  01/20/2017, 11:41 AM

## 2017-01-20 NOTE — Progress Notes (Signed)
SLP Cancellation Note  Patient Details Name: Eyan Hagood MRN: [id number] DOB: 04-17-1986   Cancelled treatment:       Reason Eval/Treat Not Completed: Patient at procedure or test/unavailable (pt having an LP at this time per RN, will continue efforts)   Macario Golds 01/20/2017, 11:30 AM  Luanna Salk, [place] [facility] SLP [phone]

## 2017-01-21 DIAGNOSIS — D61818 Other pancytopenia: Secondary | ICD-10-CM

## 2017-01-21 LAB — RENAL FUNCTION PANEL
ALBUMIN: 2.1 g/dL — AB (ref 3.5–5.0)
ANION GAP: 5 (ref 5–15)
BUN: 14 mg/dL (ref 6–20)
CALCIUM: 8.8 mg/dL — AB (ref 8.9–10.3)
CO2: 22 mmol/L (ref 22–32)
CREATININE: 0.95 mg/dL (ref 0.61–1.24)
Chloride: 108 mmol/L (ref 101–111)
Glucose, Bld: 86 mg/dL (ref 65–99)
PHOSPHORUS: 4.9 mg/dL — AB (ref 2.5–4.6)
Potassium: 4 mmol/L (ref 3.5–5.1)
SODIUM: 135 mmol/L (ref 135–145)

## 2017-01-21 LAB — MAGNESIUM: MAGNESIUM: 1.2 mg/dL — AB (ref 1.7–2.4)

## 2017-01-21 MED ORDER — HEPARIN SODIUM (PORCINE) 5000 UNIT/ML IJ SOLN
5000.0000 [IU] | Freq: Three times a day (TID) | INTRAMUSCULAR | Status: DC
  Administered 2017-01-21 – 2017-01-23 (×7): 5000 [IU] via SUBCUTANEOUS
  Filled 2017-01-21 (×6): qty 1

## 2017-01-21 NOTE — Progress Notes (Signed)
Patient ID: William Pena, male   DOB: 1986/07/11, 31 y.o.   MRN: [id number]          [facility] for Infectious Disease  Date of Admission:  12/20/2016   Total days of cryptococcal therapy 31        Day 3 fluconazole         ASSESSMENT: It seems as though his mental status has declined over the past few days coincident with switching to oral fluconazole. His opening pressure was 31 cm of water when he had a repeat lumbar puncture yesterday. His recent CSF studies are improving otherwise with decreased white blood cell count, normal glucose, decreased protein and a negative culture as of his last lumbar puncture on 01/05/2017. I will continue fluconazole for now but strongly consider switching back to amphotericin and flucytosine tomorrow if he is not more alert and interactive.  PLAN: 1. Continue fluconazole for now  Principal Problem:   Perirectal abscess Active Problems:   Chronic pain   AIDS (acquired immune deficiency syndrome) (HCC)   Hyponatremia   Normocytic anemia   Elevated LFTs   Pulmonary infiltrates   Hypomagnesemia   Encephalopathy acute   SIRS (systemic inflammatory response syndrome) (HCC)   Acute respiratory failure (HCC)   Rectal abscess   Cryptococcal meningoencephalitis (HCC)   Cryptococcosis (HCC)   Cavitary pneumonia   Diffuse lymphadenopathy   Drug rash   Elevated intracranial pressure   HIV disease (HCC)   Lymphadenopathy of head and neck   HIV (human immunodeficiency virus infection) ([place])   Meningitis   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)   Pressure injury of skin   . amantadine  100 mg Oral BID WC  . azithromycin  1,200 mg Oral Weekly  . feeding supplement  1 Container Oral TID BM  . feeding supplement (ENSURE ENLIVE)  237 mL Oral TID BM  . heparin subcutaneous  5,000 Units Subcutaneous Q8H  . magnesium oxide  400 mg Oral BID  . mouth rinse  15 mL Mouth Rinse BID  . multivitamin  with minerals  1 tablet Oral Daily  . senna-docusate  2 tablet Oral BID  . sodium chloride flush  3 mL Intravenous Q12H  . sulfamethoxazole-trimethoprim  1 tablet Oral Daily  . tamsulosin  0.4 mg Oral Daily   Review of Systems: Review of Systems  Unable to perform ROS: Mental acuity    Allergies  Allergen Reactions  . Augmentin [Amoxicillin-Pot Clavulanate]     "break out"    OBJECTIVE: Vitals:   01/19/17 2346 01/20/17 1539 01/20/17 2234 01/21/17 0706  BP: 111/68 108/66 135/65 (!) 154/77  Pulse: 98 89 81 72  Resp: 18 16 20 16   Temp: 98.2 F (36.8 C) 98.4 F (36.9 C) 99.1 F (37.3 C) 97.7 F (36.5 C)  TempSrc:    Oral  SpO2: 98% 100% 100% 99%  Weight:      Height:       Body mass index is 22.06 kg/m.  Physical Exam  Constitutional:  [name] lying quietly in bed. He opens his eyes. He only attempts to answer questions if I ask repeatedly and then he speaks in a whisper that I cannot understand. His nurse tells me that he was interacting more with her this morning and able to tell her that his headache was 10 out of 10. No family are present.  Neck: Neck supple.  Cardiovascular: Normal rate and regular rhythm.  No murmur heard. Pulmonary/Chest: Effort normal and breath sounds normal. He has no wheezes. He has no rales.  Abdominal: Soft. There is no tenderness.    Lab Results Lab Results  Component Value Date   WBC 4.9 01/19/2017   HGB 8.1 (L) 01/19/2017   HCT 25.2 (L) 01/19/2017   MCV 86.3 01/19/2017   PLT 122 (L) 01/19/2017    Lab Results  Component Value Date   CREATININE 0.95 01/21/2017   BUN 14 01/21/2017   NA 135 01/21/2017   K 4.0 01/21/2017   CL 108 01/21/2017   CO2 22 01/21/2017    Lab Results  Component Value Date   ALT 41 01/20/2017   AST 45 (H) 01/20/2017   ALKPHOS 63 01/20/2017   BILITOT 0.5 01/20/2017     Microbiology: Recent Results (from the past 240 hour(s))  CSF culture     Status: None (Preliminary result)   Collection Time:  01/20/17 11:38 AM  Result Value Ref Range Status   Specimen Description CSF  Final   Special Requests TUBE 2  Final   Gram Stain   Final    CYTOSPIN SMEAR WBC PRESENT, PREDOMINANTLY MONONUCLEAR ENCAPSULATED YEAST SEEN CRITICAL RESULT CALLED TO, READ BACK BY AND VERIFIED WITH: Ardelia Mems RN 13:40 01/20/17 ([email or web address])    Culture NO GROWTH 1 DAY  Final   Report Status PENDING  Incomplete    Michel Bickers, MD [facility] [phone] pager   [phone] cell 01/21/2017, 2:25 PM

## 2017-01-21 NOTE — NC FL2 (Signed)
[place] LEVEL OF CARE SCREENING TOOL     IDENTIFICATION  Patient Name: William Pena Birthdate: [birth year] Sex: male Admission Date (Current Location): 12/20/2016  [facility] and [place] Number:  Herbalist and Address:  [facility] [place], [place] [address] [place], [place] [address]      Provider Number: [id number]  Attending Physician Name and Address:  Elgergawy, Silver Huguenin, MD  Relative Name and Phone Number:       Current Level of Care: Hospital Recommended Level of Care: [place] Prior Approval Number:    Date Approved/Denied:   PASRR Number: [id number]  Discharge Plan: SNF    Current Diagnoses: Patient Active Problem List   Diagnosis Date Noted  . Pressure injury of skin 01/16/2017  . AKI (acute kidney injury) ([place])   . HIV (human immunodeficiency virus infection) ([place])   . Meningitis   . 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 Acute blood loss anemia   . Elevated intracranial pressure   . HIV disease ([place])   . Lymphadenopathy of head and neck   . Rectal abscess   . Cryptococcal meningoencephalitis ([place])   . Cryptococcosis ([place])   . Cavitary pneumonia   . Diffuse lymphadenopathy   . Drug rash   . Hypomagnesemia 12/23/2016  . Encephalopathy acute 12/23/2016  . SIRS (systemic inflammatory response syndrome) ([place]) 12/23/2016  . Acute respiratory failure ([place]) 12/23/2016  . Pulmonary infiltrates   . Perirectal abscess 12/21/2016  . Hyponatremia 12/21/2016  . Normocytic anemia 12/21/2016  . Elevated LFTs 12/21/2016  . AIDS (acquired immune deficiency syndrome) ([place]) 07/07/2016  . Chronic pain 06/20/2011    Orientation RESPIRATION BLADDER Height & Weight     Self, Situation, Place  Normal Incontinent, Indwelling catheter (Urethal catheter placed on 6/22) Weight: 136 lb 11 oz (62 kg) Height:  5\' 6"  (167.6 cm)  BEHAVIORAL SYMPTOMS/MOOD NEUROLOGICAL BOWEL NUTRITION STATUS      Incontinent  (Please see d/c  summary)  AMBULATORY STATUS COMMUNICATION OF NEEDS Skin   Extensive Assist Verbally PU Stage and Appropriate Care, Surgical wounds (Closed incision,  Perineum , moist to dry guaze, twice a day. Closed incision, back, no dressing. Open wound on medial buttocks, guaze dressing, change twice a day.)   PU Stage 2 Dressing:  (Medial Sacrum, Moist to dry;ABD dressing)                   Personal Care Assistance Level of Assistance  Bathing, Feeding, Dressing Bathing Assistance: Maximum assistance Feeding assistance: Limited assistance Dressing Assistance: Maximum assistance     Functional Limitations Info  Sight, Hearing, Speech Sight Info: Adequate Hearing Info: Adequate Speech Info: Adequate    SPECIAL CARE FACTORS FREQUENCY  PT (By licensed PT), OT (By licensed OT)     PT Frequency: 3x week OT Frequency: 3x week            Contractures Contractures Info: Not present    Additional Factors Info  Code Status, Allergies Code Status Info: Full Code Allergies Info: Augmentin Amoxicillin-pot Clavulanate           Current Medications (01/21/2017):  This is the current hospital active medication list Current Facility-Administered Medications  Medication Dose Route Frequency Provider Last Rate Last Dose  . 0.9 %  sodium chloride infusion   Intravenous Continuous Raylene Miyamoto, MD 50 mL/hr at 01/20/17 0132    . acetaminophen (TYLENOL) solution 650 mg  650 mg Oral Q6H PRN Modena Jansky, MD  650 mg at 01/17/17 1018  . acetaminophen (TYLENOL) suppository 650 mg  650 mg Rectal Q6H PRN Anders Simmonds, MD   650 mg at 01/03/17 2200  . amantadine (SYMMETREL) 50 MG/5ML solution 100 mg  100 mg Oral BID WC Darrel Reach, MD   100 mg at 01/20/17 1249  . azithromycin (ZITHROMAX) tablet 1,200 mg  1,200 mg Oral Weekly Collene Gobble, MD   1,200 mg at 01/20/17 1916  . docusate sodium (COLACE) capsule 100 mg  100 mg Oral BID PRN Collene Gobble, MD   100 mg at 01/15/17 1011   . feeding supplement (BOOST / RESOURCE BREEZE) liquid 1 Container  1 Container Oral TID BM Elgergawy, Silver Huguenin, MD      . feeding supplement (ENSURE ENLIVE) (ENSURE ENLIVE) liquid 237 mL  237 mL Oral TID BM Hongalgi, Lenis Dickinson, MD   237 mL at 01/20/17 1447  . fluconazole (DIFLUCAN) IVPB 400 mg  400 mg Intravenous Q24H Carlyle Basques, MD   Stopped at 01/20/17 1901  . hydrALAZINE (APRESOLINE) injection 5 mg  5 mg Intravenous Q4H PRN Ollis, Brandi L, NP   5 mg at 01/11/17 1554  . HYDROcodone-acetaminophen (NORCO/VICODIN) 5-325 MG per tablet 1 tablet  1 tablet Oral Q6H PRN Elgergawy, Silver Huguenin, MD   1 tablet at 01/21/17 0856  . magnesium oxide (MAG-OX) tablet 400 mg  400 mg Oral BID Rolla Flatten, RPH   400 mg at 01/21/17 6060  . MEDLINE mouth rinse  15 mL Mouth Rinse BID Vernell Leep D, MD   15 mL at 01/20/17 2200  . multivitamin with minerals tablet 1 tablet  1 tablet Oral Daily Elgergawy, Silver Huguenin, MD   1 tablet at 01/21/17 0856  . ondansetron (ZOFRAN) injection 4 mg  4 mg Intravenous Q6H PRN Rayburn, Kelly A, PA-C   4 mg at 12/22/16 1939  . polyethylene glycol (MIRALAX / GLYCOLAX) packet 17 g  17 g Oral Daily PRN Modena Jansky, MD   17 g at 01/17/17 1024  . polyethylene glycol (MIRALAX / GLYCOLAX) packet 17 g  17 g Oral BID Elgergawy, Silver Huguenin, MD   17 g at 01/20/17 2312  . RESOURCE THICKENUP CLEAR   Oral PRN Modena Jansky, MD      . senna-docusate (Senokot-S) tablet 2 tablet  2 tablet Oral BID Elgergawy, Silver Huguenin, MD   2 tablet at 01/21/17 0856  . sodium chloride flush (NS) 0.9 % injection 3 mL  3 mL Intravenous Q12H Hongalgi, [name] D, MD   3 mL at 01/20/17 2200  . sulfamethoxazole-trimethoprim (BACTRIM DS,SEPTRA DS) 800-160 MG per tablet 1 tablet  1 tablet Oral Daily Hammons, Theone Murdoch, RPH   1 tablet at 01/21/17 0857  . tamsulosin (FLOMAX) capsule 0.4 mg  0.4 mg Oral Daily Elgergawy, Silver Huguenin, MD   0.4 mg at 01/21/17 0459     Discharge Medications: Please see discharge  summary for a list of discharge medications.  Relevant Imaging Results:  Relevant Lab Results:   Additional Information SSN: [id number]  Eileen Stanford, LCSW

## 2017-01-21 NOTE — Progress Notes (Signed)
PROGRESS NOTE   William Pena  QQP:[id number]    DOB: 1986/06/24    DOA: 12/20/2016  PCP: [name], MD   I have briefly reviewed patients previous medical records in [place].  Brief Narrative:  31 year old male with a PMH of HIV/AIDS (last CD4 on 12/21/16:21), just started back on ART 2 months PTA, however he was incarcerated and reported that he was not getting his medications, admitted on 5/22 for perirectal abscess. During admission he was also noted to have headache, fever, chills, night sweats, neck pain and lymphadenopathy. ID was consulted and recommended imaging of chest, LP and cultures be sent from perirectal I&D. Also his serum cryptococcal antigen titer was elevated raising the possibility of disseminated cryptococcal infection including lung involvement. Pulmonology was consulted by ID for bronchoscopy and washings in an effort to help determine if his CT changes reflect TB or possible pulmonary cryptococcal disease. Initially unable to perform bronchoscopy due to hypoxia. 5/28 patient had altered LOC, became very lethargic/obtunded requiring CT and LP, complicated by acute respiratory failure due to altered mental status that required mechanical ventilation. He had a lumbar CSF drain to depression arise. Now extubated, drain removed. Failed swallowing evaluation and has Cor trak forte tube feeds. Mental status slightly better but waxing and waning. ID, neurology, neurosurgery and CCM consulted during hospital course. Care transferred to stepdown and [place] on 01/09/17. MS slowly improving. Cor Trak discontinued 6/12 and diet initiated. Transferred to medical bed 6/13.Acute kidney injury on 6/14 (creatinine 2.8), likely due to acute urinary retention & Amphotericin> resolved. Antimicrobials per ID  Assessment & Plan:   Principal Problem:   Perirectal abscess Active Problems:   Chronic pain   AIDS (acquired immune deficiency syndrome) (HCC)   Hyponatremia   Normocytic  anemia   Elevated LFTs   Pulmonary infiltrates   Hypomagnesemia   Encephalopathy acute   SIRS (systemic inflammatory response syndrome) (HCC)   Acute respiratory failure (HCC)   Rectal abscess   Cryptococcal meningoencephalitis (HCC)   Cryptococcosis (HCC)   Cavitary pneumonia   Diffuse lymphadenopathy   Drug rash   Elevated intracranial pressure   HIV disease (HCC)   Lymphadenopathy of head and neck   HIV (human immunodeficiency virus infection) ([place])   Meningitis   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 - Patient with increased ICP secondary to cryptococcal meningitis, requiring lumbar drain was intubated in ICU, this has been discontinued, repeat LP 6/22 still showing increased opening pressure at 31 (but this is much better and forwarded was 55 ), as well CSF analysis still showing significant yeast , discussed with ID, will need LP early this coming week, if pressures remains elevated, then he will require VP shunt .  Acute urinary retention:  - Patient with evidence of urinary retention, started on Flomax, failed voiding trial 2, Foley reinserted again 6/22.  Acute kidney injury:  -Creatinine went up from 1.06 > 2.84. Secondary to Acute Urinary Retention and amphotericin. Acute kidney injury resolved,   Acute hypoxic respiratory failure secondary to cryptococcal pneumonia:  - Extubated 6/5. Resolved.  Acute encephalopathy:  - Multifactorial >cryptococcal meningitis, acute strokes, metabolic derangements, medication effect and respiratory failure. Has been slowly improving, but not back to  baseline, not clear if he'll ever be back to his baseline given significant strokes he had,  this was discussed with mother   Acute ischemic stroke:  - Due to small vessel involvement from his cryptococcal infection. As per neurology, continue with antifungal treatment and no role for antiplatelet agents or statins in this setting.   HIV/AIDS:  - CD4 count this admission 21 and high viral load >1 million. Infectious disease following. Continue prophylaxis with Bactrim and azithromycin. ART not initiated due to high risk for IRIS, OD initiated in 1-2 weeks per ID  Perirectal abscess:  - Status post I&D. Twice a day dressing changes. WOC following.  Genital herpes:  - Completed valacyclovir.  Hypomagnesemia:  - Replace & follow as needed. Pharmacy managing.  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  Diarrhea:  - Stool culture negative. Resolved.  Anemia of chronic disease:  - Monitor closely, and transfuse as needed   Elevated blood pressure:  - When necessary IV hydralazine and monitor.  Thrombocytopenia:  - Unclear etiology.? Medication effect/Flucytosine. Stable. No bleeding reported.   Facial swelling: It appears to be improving, will monitor closely I did not notice this but ID did later in the day.? Related to hypoalbuminemia and positioning and if worsens repeat noncontrasted head CT.  Hypomagnesemia - Monitor closely and replete as needed   Constipation -  started on bowel regimen   DVT prophylaxis: Subcutaneous heparin Code Status: Full Family Communication: Discussed with mother at bedside 6/22 Disposition:  DC to SNF when improved> still not clinically ready for discharge, to anticipate will need another few days, still dealing with increased blood pressure and possible need for another therapy, Rehab did not feel appropriate for CIR.   Consultants:  CCM-signed off Infectious disease Neurology Neurosurgery   Procedures:  OETT 8.0 5/28 - 6/5 OGT 5/28 - 6/5 Lumbar Drain 5/28 >>DC'ed 6/10 Foley 5/28 >>  6/12, 6/14 > PIV  Cor trak> discontinued 6/12   Antimicrobials:  Triumeq 5/23 - 5/24 Flagyl 5/23 - 5/24 Vancomycin 5/23 - 5/24 Doxycycline 5/24 - 5/25 Rocephin 5/23 - 5/30 Augmentin 5/24 - 5/25 Bactrim 5/23 >> Flucytosine 5/24 >> Valtrex 5/24 >> 6/3  Azithromycin qweek 5/24 >> Ambisome 5/24 >>   MICROBIOLOGY: MRSA PCR 5/23:  Negative  Blood Cultures x2 5/22:  2/2 Bottles Positive Cryptococcus neoformans  Blood Cultures x2 5/23:  2/2 Bottles Positive Cryptococcus neoformans  Wound Culture 5/23:  Multiple organisms present CSF Culture 5/24:  Abundant Cryptococcus neoformans  Quantiferon TB 5/26:  Negative  CSF Culture 5/27:  Cryptococcus neoformans  BAL RML 5/28:  Cryptococcus neoformans / AFB smear negative & culture pending Tracheal Aspirate AFB 5/30 >>> Smear negative / Ctx pending Blood Cultures x2 5/30:  Negative  Stool Culture 5/31:  Negative  Stool Gastrointestinal Panel 5/31:  Negative Stool C diff 5/31:  Negative  CSF Culture 6/1 >> Encapsulated yeast seen   Subjective: Patient with no significant difference overnight, encouraged to eat more, and to drink supplements, and eyes any chest pain, shortness of breath, fever or chills, no nausea or vomiting.  Objective:  Vitals:   01/19/17 2346 01/20/17 1539 01/20/17 2234 01/21/17 0706  BP: 111/68 108/66 135/65 (!) 154/77  Pulse: 98 89 81 72  Resp: _0 Temp: 98.2 F (36.8 C) 98.4 F (36.9 C) 99.1 F (37.3 C) 97.7 F (36.5 C)  TempSrc:    Oral  SpO2: 98% 100% 100% 99%  Weight:  Height:        Examination:  General exam: Pleasant Male, well-built, sitting in bed in no apparent distress   Respiratory system: Good air entry bilaterally, clear to auscultation, no use of accessory muscle  Cardiovascular system: Regular rate and rhythm, no rubs murmurs gallops S1 and S2 heard, r Gastrointestinal system: Abdomen soft, nontender, nondistended, bowel sounds present Soft, nontender, nondistended, bowel  sounds present  Central nervous system: Patient is sluggish to react, answers simple questions, no focal neurological deficits . Extremities: Some rigidity in upper extremities, but significantly improving, otherwise no focal deficits  Skin: Extensive tattooing of his body. Psychiatry: Judgement and insight improving. Mood & affect : Flat.    Data Reviewed: I have personally reviewed following labs and imaging studies  CBC:  Recent Labs Lab 01/15/17 0659 01/16/17 0640 01/17/17 0630 01/19/17 0618  WBC 5.4 4.9 5.5 4.9  NEUTROABS  --  3.8  --   --   HGB 7.2* 7.0* 8.0* 8.1*  HCT 22.4* 22.2* 24.5* 25.2*  MCV 85.8 84.4 86.0 86.3  PLT 111* 105* 92* 832*   Basic Metabolic Panel:  Recent Labs Lab 01/17/17 0630 01/18/17 0758 01/19/17 0618 01/20/17 0510 01/21/17 0358  NA 131* 132* 131* 139 135  K 3.7 3.9 3.7 4.1 4.0  CL 106 108 107 114* 108  CO2 20* 20* 19* 21* 22  GLUCOSE 92 92 97 90 86  BUN _0 CREATININE 0.89 0.83 1.06 1.14 0.95  CALCIUM 8.2* 8.1* 8.4* 8.9 8.8*  MG 1.7 1.1* 1.5* 1.8 1.2*  PHOS 4.5 3.7 4.3 5.5* 4.9*   Liver Function Tests:  Recent Labs Lab 01/18/17 0758 01/19/17 0618 01/20/17 0510 01/20/17 1230 01/21/17 0358  AST  --   --   --  45*  --   ALT  --   --   --  41  --   ALKPHOS  --   --   --  63  --   BILITOT  --   --   --  0.5  --   PROT  --   --   --  8.5*  --   ALBUMIN 2.1* 2.2* 2.1* 2.1* 2.1*   Coagulation Profile: No results for input(s): INR, PROTIME in the last 168 hours. CBG: No results for input(s): GLUCAP in the last 168 hours.  Recent Results (from the past 240 hour(s))  CSF culture     Status: None (Preliminary result)   Collection Time: 01/20/17 11:38 AM  Result Value Ref Range Status   Specimen Description CSF  Final   Special Requests TUBE 2  Final   Gram Stain   Final    CYTOSPIN SMEAR WBC PRESENT, PREDOMINANTLY MONONUCLEAR ENCAPSULATED YEAST SEEN CRITICAL RESULT CALLED TO, READ BACK BY AND VERIFIED WITH: Ardelia Mems RN 13:40 01/20/17 ([email or web address])    Culture NO GROWTH 1 DAY  Final   Report Status PENDING  Incomplete         Radiology Studies: No results found.      Scheduled Meds: . amantadine  100 mg Oral BID WC  . azithromycin  1,200 mg Oral Weekly  . feeding supplement  1 Container Oral TID BM  . feeding supplement (ENSURE ENLIVE)  237 mL Oral TID BM  . magnesium oxide  400 mg Oral BID  . mouth rinse  15 mL Mouth Rinse BID  . multivitamin with minerals  1 tablet Oral Daily  . senna-docusate  2 tablet Oral BID  .  sodium chloride flush  3 mL Intravenous Q12H  . sulfamethoxazole-trimethoprim  1 tablet Oral Daily  . tamsulosin  0.4 mg Oral Daily   Continuous Infusions: . sodium chloride 50 mL/hr at 01/20/17 0132  . fluconazole (DIFLUCAN) IV Stopped (01/20/17 1901)     LOS: 21 days     ELGERGAWY, DAWOOD, MD,  [facility] Pager [phone]  If 7PM-7AM, please contact night-coverage [email or web address] Password [email or web address] 01/21/2017, 1:21 PM

## 2017-01-22 LAB — RENAL FUNCTION PANEL
Albumin: 2.1 g/dL — ABNORMAL LOW (ref 3.5–5.0)
Anion gap: 5 (ref 5–15)
BUN: 13 mg/dL (ref 6–20)
CALCIUM: 8.7 mg/dL — AB (ref 8.9–10.3)
CO2: 24 mmol/L (ref 22–32)
CREATININE: 0.95 mg/dL (ref 0.61–1.24)
Chloride: 105 mmol/L (ref 101–111)
GFR calc non Af Amer: >60 mL/min (ref >60)
GLUCOSE: 83 mg/dL (ref 65–99)
Phosphorus: 4.7 mg/dL — ABNORMAL HIGH (ref 2.5–4.6)
Potassium: 4.2 mmol/L (ref 3.5–5.1)
SODIUM: 134 mmol/L — AB (ref 135–145)

## 2017-01-22 LAB — MAGNESIUM: MAGNESIUM: 1.2 mg/dL — AB (ref 1.7–2.4)

## 2017-01-22 NOTE — Progress Notes (Signed)
Patient ID: William Pena, male   DOB: [birth year], 31 y.o.   MRN: [id number]          [facility]  Date of Admission:  12/20/2016   Total days of cryptococcal therapy 32        Day 4 fluconazole         ASSESSMENT: He remains lethargic but otherwise stable. His opening pressure was elevated at the time of his last LP 2 days ago. Otherwise CSF parameters are steadily improving. His CSF culture on 01/05/2017 was negative. The CSF obtained 2 days ago was negative so far. I will continue fluconazole for now. He probably needs a repeat LP to recheck his pressure in the next few days.  PLAN: 1. Continue fluconazole for now  Principal Problem:   Perirectal abscess Active Problems:   Chronic pain   AIDS (acquired immune deficiency syndrome) (HCC)   Hyponatremia   Normocytic anemia   Elevated LFTs   Pulmonary infiltrates   Hypomagnesemia   Encephalopathy acute   SIRS (systemic inflammatory response syndrome) (HCC)   Acute respiratory failure (HCC)   Rectal abscess   Cryptococcal meningoencephalitis (HCC)   Cryptococcosis (HCC)   Cavitary pneumonia   Diffuse lymphadenopathy   Drug rash   Elevated intracranial pressure   HIV disease (HCC)   Lymphadenopathy of head and neck   HIV (human immunodeficiency virus infection) ([place])   Meningitis   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 amantadine  100 mg Oral BID WC  . azithromycin  1,200 mg Oral Weekly  . feeding supplement  1 Container Oral TID BM  . feeding supplement (ENSURE ENLIVE)  237 mL Oral TID BM  . heparin subcutaneous  5,000 Units Subcutaneous Q8H  . magnesium oxide  400 mg Oral BID  . mouth rinse  15 mL Mouth Rinse BID  . multivitamin with minerals  1 tablet Oral Daily  . senna-docusate  2 tablet Oral BID  . sodium chloride flush  3 mL Intravenous Q12H  . sulfamethoxazole-trimethoprim  1 tablet Oral Daily  .  tamsulosin  0.4 mg Oral Daily   Review of Systems: Review of Systems  Unable to perform ROS: Mental acuity    Allergies  Allergen Reactions  . Augmentin [Amoxicillin-Pot Clavulanate]     "break out"    OBJECTIVE: Vitals:   01/21/17 0706 01/21/17 1452 01/21/17 2213 01/22/17 0600  BP: (!) 154/77 116/70 117/78 122/74  Pulse: 72 75 73 72  Resp: 16 14 18 16   Temp: 97.7 F (36.5 C) 98.6 F (37 C) 97.8 F (36.6 C) 98.2 F (36.8 C)  TempSrc: Oral  Axillary Oral  SpO2: 99% 100% 100% 98%  Weight:      Height:       Body mass index is 22.06 kg/m.  Physical Exam  Constitutional:  [name] lying quietly in bed with his eyes open. He answers questions slowly and quietly. No family are present.  Neck: Neck supple.  Cardiovascular: Normal rate and regular rhythm.   No murmur heard. Pulmonary/Chest: Effort normal and breath sounds normal. He has no wheezes. He has no rales.  Abdominal: Soft. There is no tenderness.    Lab Results Lab Results  Component Value Date   WBC 4.9 01/19/2017   HGB 8.1 (L) 01/19/2017   HCT 25.2 (L) 01/19/2017   MCV 86.3 01/19/2017   PLT 122 (L)  01/19/2017    Lab Results  Component Value Date   CREATININE 0.95 01/22/2017   BUN 13 01/22/2017   NA 134 (L) 01/22/2017   K 4.2 01/22/2017   CL 105 01/22/2017   CO2 24 01/22/2017    Lab Results  Component Value Date   ALT 41 01/20/2017   AST 45 (H) 01/20/2017   ALKPHOS 63 01/20/2017   BILITOT 0.5 01/20/2017     Microbiology: Recent Results (from the past 240 hour(s))  CSF culture     Status: None (Preliminary result)   Collection Time: 01/20/17 11:38 AM  Result Value Ref Range Status   Specimen Description CSF  Final   Special Requests TUBE 2  Final   Gram Stain   Final    CYTOSPIN SMEAR WBC PRESENT, PREDOMINANTLY MONONUCLEAR ENCAPSULATED YEAST SEEN CRITICAL RESULT CALLED TO, READ BACK BY AND VERIFIED WITH: Ardelia Mems RN 13:40 01/20/17 ([email or web address])    Culture NO GROWTH 2 DAYS  Final   Report  Status PENDING  Incomplete    Michel Bickers, MD [facility] [phone] pager   [phone] cell 01/22/2017, 11:49 AM

## 2017-01-22 NOTE — Progress Notes (Signed)
PROGRESS NOTE   Wen Merced  IRS:[id number]    DOB: 06-27-1986    DOA: 12/20/2016  PCP: [name], MD   I have briefly reviewed patients previous medical records in [place].  Brief Narrative:  31 year old male with a PMH of HIV/AIDS (last CD4 on 12/21/16:21), just started back on ART 2 months PTA, however he was incarcerated and reported that he was not getting his medications, admitted on 5/22 for perirectal abscess. During admission he was also noted to have headache, fever, chills, night sweats, neck pain and lymphadenopathy. ID was consulted and recommended imaging of chest, LP and cultures be sent from perirectal I&D. Also his serum cryptococcal antigen titer was elevated raising the possibility of disseminated cryptococcal infection including lung involvement. Pulmonology was consulted by ID for bronchoscopy and washings in an effort to help determine if his CT changes reflect TB or possible pulmonary cryptococcal disease. Initially unable to perform bronchoscopy due to hypoxia. 5/28 patient had altered LOC, became very lethargic/obtunded requiring CT and LP, complicated by acute respiratory failure due to altered mental status that required mechanical ventilation. He had a lumbar CSF drain to depression arise. Now extubated, drain removed. Failed swallowing evaluation and has Cor trak forte tube feeds. Mental status slightly better but waxing and waning. ID, neurology, neurosurgery and CCM consulted during hospital course. Care transferred to stepdown and [place] on 01/09/17. MS slowly improving. Cor Trak discontinued 6/12 and diet initiated. Transferred to medical bed 6/13.Acute kidney injury on 6/14 (creatinine 2.8), likely due to acute urinary retention & Amphotericin> resolved. Antimicrobials per ID  Assessment & Plan:   Principal Problem:   Perirectal abscess Active Problems:   Chronic pain   AIDS (acquired immune deficiency syndrome) (HCC)   Hyponatremia   Normocytic  anemia   Elevated LFTs   Pulmonary infiltrates   Hypomagnesemia   Encephalopathy acute   SIRS (systemic inflammatory response syndrome) (HCC)   Acute respiratory failure (HCC)   Rectal abscess   Cryptococcal meningoencephalitis (HCC)   Cryptococcosis (HCC)   Cavitary pneumonia   Diffuse lymphadenopathy   Drug rash   Elevated intracranial pressure   HIV disease (HCC)   Lymphadenopathy of head and neck   HIV (human immunodeficiency virus infection) ([place])   Meningitis   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 - Patient with increased ICP secondary to cryptococcal meningitis, requiring lumbar drain was intubated in ICU, this has been discontinued, repeat LP 6/22 still showing increased opening pressure at 31 (but this is much better and forwarded was 55 ), as well CSF analysis still showing significant yeast , will need LP early this coming week per ID, if pressures remains elevated, then he will require VP shunt .  Acute urinary retention:  - Patient with evidence of urinary retention, started on Flomax, failed voiding trial 2, Foley reinserted again 6/22.  Acute kidney injury:  -Creatinine went up from 1.06 > 2.84. Secondary to Acute Urinary Retention and amphotericin. Acute kidney injury resolved,   Acute hypoxic respiratory failure secondary to cryptococcal pneumonia:  - Extubated 6/5. Resolved.  Acute encephalopathy:  - Multifactorial >cryptococcal meningitis, acute strokes, metabolic derangements, medication effect and respiratory failure. Has been slowly improving, but not back to baseline,  not clear if he'll ever be back to his baseline given significant strokes he had, this was  discussed with mother   Acute ischemic stroke:  - Due to small vessel involvement from his cryptococcal infection. As per neurology, continue with antifungal treatment and no role for antiplatelet agents or statins in this setting.   HIV/AIDS:  - CD4 count this admission 21 and high viral load >1 million. Infectious disease following. Continue prophylaxis with Bactrim and azithromycin. ART not initiated due to high risk for IRIS, OD initiated in 1-2 weeks per ID  Perirectal abscess:  - Status post I&D. Twice a day dressing changes. WOC following.  Genital herpes:  - Completed valacyclovir.  Hypomagnesemia:  - Replace & follow as needed. Pharmacy managing.  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  Diarrhea:  - Stool culture negative. Resolved.  Anemia of chronic disease:  - Monitor closely, and transfuse as needed   Elevated blood pressure:  - When necessary IV hydralazine and monitor.  Thrombocytopenia:  - Unclear etiology.? Medication effect/Flucytosine. Stable. No bleeding reported.   Facial swelling: It appears to be improving, will monitor closely I did not notice this but ID did later in the day.? Related to hypoalbuminemia and positioning and if worsens repeat noncontrasted head CT.  Hypomagnesemia - Monitor closely and replete as needed   Constipation -  started on bowel regimen   DVT prophylaxis: Subcutaneous heparin Code Status: Full Family Communication: Discussed with mother at bedside 6/22 Disposition:  DC to SNF when improved> still not clinically ready for discharge, to anticipate will need another few days, still dealing with increased blood pressure and possible need for another therapy, Rehab did not feel appropriate for CIR.   Consultants:  CCM-signed off Infectious disease Neurology Neurosurgery   Procedures:  OETT 8.0 5/28 - 6/5 OGT 5/28 - 6/5 Lumbar Drain 5/28 >>DC'ed 6/10 Foley 5/28 >> 6/12, 6/14  > PIV  Cor trak> discontinued 6/12   Antimicrobials:  Triumeq 5/23 - 5/24 Flagyl 5/23 - 5/24 Vancomycin 5/23 - 5/24 Doxycycline 5/24 - 5/25 Rocephin 5/23 - 5/30 Augmentin 5/24 - 5/25 Bactrim 5/23 >> Flucytosine 5/24 >> Valtrex 5/24 >> 6/3  Azithromycin qweek 5/24 >> Ambisome 5/24 >>   MICROBIOLOGY: MRSA PCR 5/23:  Negative  Blood Cultures x2 5/22:  2/2 Bottles Positive Cryptococcus neoformans  Blood Cultures x2 5/23:  2/2 Bottles Positive Cryptococcus neoformans  Wound Culture 5/23:  Multiple organisms present CSF Culture 5/24:  Abundant Cryptococcus neoformans  Quantiferon TB 5/26:  Negative  CSF Culture 5/27:  Cryptococcus neoformans  BAL RML 5/28:  Cryptococcus neoformans / AFB smear negative & culture pending Tracheal Aspirate AFB 5/30 >>> Smear negative / Ctx pending Blood Cultures x2 5/30:  Negative  Stool Culture 5/31:  Negative  Stool Gastrointestinal Panel 5/31:  Negative Stool C diff 5/31:  Negative  CSF Culture 6/1 >> Encapsulated yeast seen   Subjective: Patient with no significant difference overnight, encouraged to eat more, and to drink supplements, and eyes any chest pain, shortness of breath, fever or chills, no nausea or vomiting.  Objective:  Vitals:   01/21/17 0706 01/21/17 1452 01/21/17 2213 01/22/17 0600  BP: (!) 154/77 116/70 117/78 122/74  Pulse: 72 75 73 72  Resp: _0 Temp: 97.7 F (36.5 C) 98.6 F (37 C) 97.8 F (36.6 C) 98.2 F (36.8 C)  TempSrc: Oral  Axillary Oral  SpO2: 99% 100% 100% 98%  Weight:  Height:        Examination:  General exam: Pleasant Male, well-built, Laying in bed in no apparent distress  Respiratory system: Good air entry, clear to auscultation Cardiovascular system: Regular rate and rhythm, no rubs murmurs gallops   S1 and S2 heard,  Gastrointestinal system: Abdomen soft, nontender, nondistended, bowel sounds present Abdomen soft, nontender, nondistended, bowel sounds present Soft, nontender,  nondistended, bowel sounds present  Central nervous system: Patient is sluggish to react, answers simple questions, no focal neurological deficits . Skin: Extensive tattooing of his body. Psychiatry: More coherent today, still speaks with a very soft voice, but more appropriate today    Data Reviewed: I have personally reviewed following labs and imaging studies  CBC:  Recent Labs Lab 01/16/17 0640 01/17/17 0630 01/19/17 0618  WBC 4.9 5.5 4.9  NEUTROABS 3.8  --   --   HGB 7.0* 8.0* 8.1*  HCT 22.2* 24.5* 25.2*  MCV 84.4 86.0 86.3  PLT 105* 92* 409*   Basic Metabolic Panel:  Recent Labs Lab 01/18/17 0758 01/19/17 0618 01/20/17 0510 01/21/17 0358 01/22/17 0538  NA 132* 131* 139 135 134*  K 3.9 3.7 4.1 4.0 4.2  CL 108 107 114* 108 105  CO2 20* 19* 21* 22 24  GLUCOSE 92 97 90 86 83  BUN _0 CREATININE 0.83 1.06 1.14 0.95 0.95  CALCIUM 8.1* 8.4* 8.9 8.8* 8.7*  MG 1.1* 1.5* 1.8 1.2* 1.2*  PHOS 3.7 4.3 5.5* 4.9* 4.7*   Liver Function Tests:  Recent Labs Lab 01/19/17 0618 01/20/17 0510 01/20/17 1230 01/21/17 0358 01/22/17 0538  AST  --   --  45*  --   --   ALT  --   --  41  --   --   ALKPHOS  --   --  63  --   --   BILITOT  --   --  0.5  --   --   PROT  --   --  8.5*  --   --   ALBUMIN 2.2* 2.1* 2.1* 2.1* 2.1*   Coagulation Profile: No results for input(s): INR, PROTIME in the last 168 hours. CBG: No results for input(s): GLUCAP in the last 168 hours.  Recent Results (from the past 240 hour(s))  CSF culture     Status: None (Preliminary result)   Collection Time: 01/20/17 11:38 AM  Result Value Ref Range Status   Specimen Description CSF  Final   Special Requests TUBE 2  Final   Gram Stain   Final    CYTOSPIN SMEAR WBC PRESENT, PREDOMINANTLY MONONUCLEAR ENCAPSULATED YEAST SEEN CRITICAL RESULT CALLED TO, READ BACK BY AND VERIFIED WITH: Ardelia Mems RN 13:40 01/20/17 ([email or web address])    Culture NO GROWTH 2 DAYS  Final   Report Status PENDING   Incomplete         Radiology Studies: No results found.      Scheduled Meds: . amantadine  100 mg Oral BID WC  . azithromycin  1,200 mg Oral Weekly  . feeding supplement  1 Container Oral TID BM  . feeding supplement (ENSURE ENLIVE)  237 mL Oral TID BM  . heparin subcutaneous  5,000 Units Subcutaneous Q8H  . magnesium oxide  400 mg Oral BID  . mouth rinse  15 mL Mouth Rinse BID  . multivitamin with minerals  1 tablet Oral Daily  . senna-docusate  2 tablet Oral BID  . sodium chloride flush  3 mL Intravenous Q12H  .  sulfamethoxazole-trimethoprim  1 tablet Oral Daily  . tamsulosin  0.4 mg Oral Daily   Continuous Infusions: . sodium chloride 50 mL/hr at 01/22/17 0000  . fluconazole (DIFLUCAN) IV 400 mg (01/21/17 2309)     LOS: 26 days     Merly Hinkson, MD,  [facility] Pager [phone]  If 7PM-7AM, please contact night-coverage [email or web address] Password [facility] 01/22/2017, 11:52 AM

## 2017-01-23 LAB — RENAL FUNCTION PANEL
ALBUMIN: 2.2 g/dL — AB (ref 3.5–5.0)
ANION GAP: 4 — AB (ref 5–15)
BUN: 13 mg/dL (ref 6–20)
CHLORIDE: 103 mmol/L (ref 101–111)
CO2: 26 mmol/L (ref 22–32)
Calcium: 8.6 mg/dL — ABNORMAL LOW (ref 8.9–10.3)
Creatinine, Ser: 0.91 mg/dL (ref 0.61–1.24)
Glucose, Bld: 85 mg/dL (ref 65–99)
PHOSPHORUS: 4.5 mg/dL (ref 2.5–4.6)
POTASSIUM: 3.9 mmol/L (ref 3.5–5.1)
Sodium: 133 mmol/L — ABNORMAL LOW (ref 135–145)

## 2017-01-23 LAB — CSF CULTURE W GRAM STAIN

## 2017-01-23 LAB — PATHOLOGIST SMEAR REVIEW

## 2017-01-23 LAB — CSF CULTURE: CULTURE: NO GROWTH

## 2017-01-23 LAB — MAGNESIUM: MAGNESIUM: 1.4 mg/dL — AB (ref 1.7–2.4)

## 2017-01-23 MED ORDER — MAGNESIUM SULFATE 2 GM/50ML IV SOLN
2.0000 g | Freq: Once | INTRAVENOUS | Status: AC
  Administered 2017-01-23: 2 g via INTRAVENOUS
  Filled 2017-01-23: qty 50

## 2017-01-23 NOTE — Progress Notes (Signed)
PROGRESS NOTE   William Pena  ONG:[id number]    DOB: 01/16/1986    DOA: 12/20/2016  PCP: [name], MD   I have briefly reviewed patients previous medical records in [place].  Brief Narrative:  31 year old male with a PMH of HIV/AIDS (last CD4 on 12/21/16:21), just started back on ART 2 months PTA, however he was incarcerated and reported that he was not getting his medications, admitted on 5/22 for perirectal abscess. During admission he was also noted to have headache, fever, chills, night sweats, neck pain and lymphadenopathy. ID was consulted and recommended imaging of chest, LP and cultures be sent from perirectal I&D. Also his serum cryptococcal antigen titer was elevated raising the possibility of disseminated cryptococcal infection including lung involvement. Pulmonology was consulted by ID for bronchoscopy and washings in an effort to help determine if his CT changes reflect TB or possible pulmonary cryptococcal disease. Initially unable to perform bronchoscopy due to hypoxia. 5/28 patient had altered LOC, became very lethargic/obtunded requiring CT and LP, complicated by acute respiratory failure due to altered mental status that required mechanical ventilation. He had a lumbar CSF drain to depression arise. Now extubated, drain removed. Failed swallowing evaluation and has Cor trak forte tube feeds. Mental status slightly better but waxing and waning. ID, neurology, neurosurgery and CCM consulted during hospital course. Care transferred to stepdown and [place] on 01/09/17. MS slowly improving. Cor Trak discontinued 6/12 and diet initiated. Transferred to medical bed 6/13.Acute kidney injury on 6/14 (creatinine 2.8), likely due to acute urinary retention & Amphotericin> resolved. Antimicrobials per ID  Assessment & Plan:   Principal Problem:   Perirectal abscess Active Problems:   Chronic pain   AIDS (acquired immune deficiency syndrome) (HCC)   Hyponatremia   Normocytic  anemia   Elevated LFTs   Pulmonary infiltrates   Hypomagnesemia   Encephalopathy acute   SIRS (systemic inflammatory response syndrome) (HCC)   Acute respiratory failure (HCC)   Rectal abscess   Cryptococcal meningoencephalitis (HCC)   Cryptococcosis (HCC)   Cavitary pneumonia   Diffuse lymphadenopathy   Drug rash   Elevated intracranial pressure   HIV disease (HCC)   Lymphadenopathy of head and neck   HIV (human immunodeficiency virus infection) ([place])   Meningitis   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 - Patient with increased ICP secondary to cryptococcal meningitis, requiring lumbar drain when he was intubated in ICU, this has been discontinued, repeat LP 6/22 still showing increased opening pressure at 31 (but this is much better than before when it was 55 ), as well CSF analysis still showing significant yeast , discussed with ID, will need LP tomorrow to evaluate for opening pressure, if it remains elevated then he will need VP shunt , I have stopped subcutaneous heparin today anticipation of LP tomorrow .  Acute urinary retention:  - Patient with evidence of urinary retention, started on Flomax, failed voiding trial 2, Foley reinserted again 6/22.  Acute kidney injury:  -Creatinine went up from 1.06 > 2.84. Secondary to Acute Urinary Retention and amphotericin. Acute kidney injury resolved,   Acute hypoxic respiratory failure secondary to cryptococcal pneumonia:  - Extubated 6/5. Resolved.  Acute encephalopathy:  - Multifactorial >cryptococcal meningitis,  acute strokes, metabolic derangements, medication effect and respiratory failure. Has been slowly improving, but not back to  baseline, not clear if he'll ever be back to his baseline given significant strokes he had, this was discussed with mother   Acute ischemic stroke:  - Due to small vessel involvement from his cryptococcal infection. As per neurology, continue with antifungal treatment and no role for antiplatelet agents or statins in this setting.   HIV/AIDS:  - CD4 count this admission 21 and high viral load >1 million. Infectious disease following. Continue prophylaxis with Bactrim and azithromycin. ART not initiated due to high risk for IRIS, OD initiated in 1-2 weeks per ID  Perirectal abscess:  - Status post I&D. Twice a day dressing changes. WOC following.  Genital herpes:  - Completed valacyclovir.  Hypomagnesemia:  - Replace & follow as needed. Pharmacy managing.  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  Diarrhea:  - Stool culture negative. Resolved.  Anemia of chronic disease:  - Monitor closely, and transfuse as needed   Elevated blood pressure:  - When necessary IV hydralazine and monitor.  Thrombocytopenia:  - Unclear etiology.? Medication effect/Flucytosine. Stable. No bleeding reported.   Facial swelling: It appears to be improving, will monitor closely I did not notice this but ID did later in the day.? Related to hypoalbuminemia and positioning and if worsens repeat noncontrasted head CT.  Hypomagnesemia - Monitor closely and replete as needed   Constipation -  started on bowel regimen   DVT prophylaxis: Subcutaneous heparin stopped today in anticipation of LP tomorrow Code Status: Full Family Communication: Discussed with mother 6/25 Disposition:  DC to SNF when [facility] did not feel appropriate for CIR.   Consultants:  CCM-signed off Infectious disease Neurology Neurosurgery   Procedures:  OETT 8.0 5/28 - 6/5 OGT 5/28 - 6/5 Lumbar Drain 5/28 >>DC'ed 6/10 Foley 5/28 >> 6/12, 6/14 > PIV  Cor trak> discontinued  6/12   Antimicrobials:  Triumeq 5/23 - 5/24 Flagyl 5/23 - 5/24 Vancomycin 5/23 - 5/24 Doxycycline 5/24 - 5/25 Rocephin 5/23 - 5/30 Augmentin 5/24 - 5/25 Bactrim 5/23 >> Flucytosine 5/24 >> Valtrex 5/24 >> 6/3  Azithromycin qweek 5/24 >> Ambisome 5/24 >>   MICROBIOLOGY: MRSA PCR 5/23:  Negative  Blood Cultures x2 5/22:  2/2 Bottles Positive Cryptococcus neoformans  Blood Cultures x2 5/23:  2/2 Bottles Positive Cryptococcus neoformans  Wound Culture 5/23:  Multiple organisms present CSF Culture 5/24:  Abundant Cryptococcus neoformans  Quantiferon TB 5/26:  Negative  CSF Culture 5/27:  Cryptococcus neoformans  BAL RML 5/28:  Cryptococcus neoformans / AFB smear negative & culture pending Tracheal Aspirate AFB 5/30 >>> Smear negative / Ctx pending Blood Cultures x2 5/30:  Negative  Stool Culture 5/31:  Negative  Stool Gastrointestinal Panel 5/31:  Negative Stool C diff 5/31:  Negative  CSF Culture 6/1 >> Encapsulated yeast seen   Subjective: Patient with no significant difference overnight, encouraged to eat more, and to drink supplements, and eyes any chest pain, shortness of breath, fever or chills, no nausea or vomiting.  Objective:  Vitals:   01/22/17 1549 01/22/17 2153 01/23/17 0615 01/23/17 1428  BP: 113/76 112/72 119/84 106/73  Pulse: 79 75 69 80  Resp: _0 Temp: 98.6 F (37 C) 98.2 F (36.8 C) 98.1 F (36.7 C)   TempSrc: Oral Oral Oral   SpO2: 100% 98% 100% 99%  Weight:      Height:  Examination:  General exam: Pleasant male, laying in bed in no apparent distress  Respiratory system: Good air entry bilaterally, clear to auscultation Cardiovascular system: Regular rate and rhythm, no rubs murmurs or gallops Gastrointestinal system: Abdomen soft, nontender, nondistended, bowel sounds present Central nervous system:  wakes up and reacts appropriately, alert 3  Skin: Extensive tattooing of his body. Psychiatry: More coherent today, still  speaks with a very soft voice, but more appropriate today    Data Reviewed: I have personally reviewed following labs and imaging studies  CBC:  Recent Labs Lab 01/17/17 0630 01/19/17 0618  WBC 5.5 4.9  HGB 8.0* 8.1*  HCT 24.5* 25.2*  MCV 86.0 86.3  PLT 92* 240*   Basic Metabolic Panel:  Recent Labs Lab 01/19/17 0618 01/20/17 0510 01/21/17 0358 01/22/17 0538 01/23/17 0638  NA 131* 139 135 134* 133*  K 3.7 4.1 4.0 4.2 3.9  CL 107 114* 108 105 103  CO2 19* 21* _0 GLUCOSE 97 90 86 83 85  BUN _1 CREATININE 1.06 1.14 0.95 0.95 0.91  CALCIUM 8.4* 8.9 8.8* 8.7* 8.6*  MG 1.5* 1.8 1.2* 1.2* 1.4*  PHOS 4.3 5.5* 4.9* 4.7* 4.5   Liver Function Tests:  Recent Labs Lab 01/20/17 0510 01/20/17 1230 01/21/17 0358 01/22/17 0538 01/23/17 0638  AST  --  45*  --   --   --   ALT  --  41  --   --   --   ALKPHOS  --  63  --   --   --   BILITOT  --  0.5  --   --   --   PROT  --  8.5*  --   --   --   ALBUMIN 2.1* 2.1* 2.1* 2.1* 2.2*   Coagulation Profile: No results for input(s): INR, PROTIME in the last 168 hours. CBG: No results for input(s): GLUCAP in the last 168 hours.  Recent Results (from the past 240 hour(s))  CSF culture     Status: None   Collection Time: 01/20/17 11:38 AM  Result Value Ref Range Status   Specimen Description CSF  Final   Special Requests TUBE 2  Final   Gram Stain   Final    CYTOSPIN SMEAR WBC PRESENT, PREDOMINANTLY MONONUCLEAR ENCAPSULATED YEAST SEEN CRITICAL RESULT CALLED TO, READ BACK BY AND VERIFIED WITH: Ardelia Mems RN 13:40 01/20/17 ([email or web address])    Culture NO GROWTH 3 DAYS  Final   Report Status 01/23/2017 FINAL  Final  Fungus Culture With Stain     Status: None (Preliminary result)   Collection Time: 01/20/17 11:38 AM  Result Value Ref Range Status   Fungus Stain Final report  Final    Comment: (NOTE) Performed At: [facility] [place], [place] [id number] Lindon Romp MD EQ:[id number]     Fungus (Mycology) Culture PENDING  Incomplete   Fungal Source CSF  Final    Comment: TUBE 2  Fungus Culture Result     Status: None   Collection Time: 01/20/17 11:38 AM  Result Value Ref Range Status   Result 1 Comment  Final    Comment: (NOTE) KOH/Calcofluor preparation:  no fungus observed. Performed At: [facility] [address] [place], [place] [id number] Lindon Romp MD ER:[id number]          Radiology Studies: No results found.      Scheduled Meds: . amantadine  100 mg Oral BID WC  .  azithromycin  1,200 mg Oral Weekly  . feeding supplement  1 Container Oral TID BM  . feeding supplement (ENSURE ENLIVE)  237 mL Oral TID BM  . magnesium oxide  400 mg Oral BID  . mouth rinse  15 mL Mouth Rinse BID  . multivitamin with minerals  1 tablet Oral Daily  . senna-docusate  2 tablet Oral BID  . sodium chloride flush  3 mL Intravenous Q12H  . sulfamethoxazole-trimethoprim  1 tablet Oral Daily  . tamsulosin  0.4 mg Oral Daily   Continuous Infusions: . sodium chloride 50 mL/hr at 01/22/17 2150  . fluconazole (DIFLUCAN) IV 400 mg (01/23/17 1501)     LOS: 21 days     Jonea Bukowski, MD,  [facility] Pager [phone]  If 7PM-7AM, please contact night-coverage [email or web address] Password [facility] 01/23/2017, 3:47 PM

## 2017-01-23 NOTE — Progress Notes (Signed)
  Speech Language Pathology Treatment: Dysphagia;Cognitive-Linquistic  Patient Details Name: William Pena MRN: [id number] DOB: 1986/05/02 Today's Date: 01/23/2017 Time: 2505-3976 SLP Time Calculation (min) (ACUTE ONLY): 12 min  Assessment / Plan / Recommendation Clinical Impression  Pt's lunch tray in room and appears very little if any was eaten. Really question if he is getting adequate nutrition to heal and improve. Diet/liquids have been upgraded since this SLP worked with pt (D3/nectar). Consumed several sips nectar thick and delayed cough when he phonated.  He followed commands with 100% accuracy. Urged pt to use voice to respond to question with the listener giving him  additional time to respond and keep utterances shorter due to delayed processing.  SLP reviewed results of initial speech-language-cognitive assessment on 6/6. He has met one goal however overall current cognitive/speech status appear similar to initial eval. Will update goals.      HPI HPI: Patient is a 30 y/o male presents with sepsis secondary to perirectal abscess s/p I&D 5/23 and disseminated cryptococcal infection including meningitis, bacteremia, and likely pneumonia. Intubated 5/28-6/5. 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 Second MRI- extension of the diffusion restriction in the left basal ganglia and right basal ganglia. s/p Lumbar drain 5/30. PMH includes advanced hiv disease      SLP Plan  Continue with current plan of care       Recommendations  Diet recommendations: Dysphagia 3 (mechanical soft);Nectar-thick liquid Liquids provided via: Cup;Straw Medication Administration: Whole meds with puree Supervision: Staff to assist with self feeding;Full supervision/cueing for compensatory strategies Compensations: Minimize environmental distractions;Slow rate;Small sips/bites;Other (Comment) Postural Changes and/or Swallow Maneuvers: Seated  upright 90 degrees;Upright 30-60 min after meal                Oral Care Recommendations: Oral care BID Follow up Recommendations: 24 hour supervision/assistance SLP Visit Diagnosis: Dysphagia, oropharyngeal phase (R13.12) Plan: Continue with current plan of care       GO                Houston Siren 01/23/2017, 3:14 PM    Orbie Pyo Colvin Caroli.Ed [employer] [phone]

## 2017-01-23 NOTE — Progress Notes (Signed)
Physical Therapy Treatment Patient Details Name: William Pena MRN: [id number] DOB: 11-02-1985 Today's Date: 01/23/2017    History of Present Illness Patient is a 31 y/o male presents with sepsis secondary to perirectal abscess s/p I&D 5/23 and disseminated cryptococcal infection including meningitis, bacteremia, and likely pneumonia. Intubated 5/28-6/5. Pt found to have acute ischemic strokes. 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 Second MRI- extension of the diffusion restriction in the left basal ganglia and right basal ganglia. s/p Lumbar drain 5/30. PMH includes advanced hiv disease    PT Comments    Patient demonstrating improvements in activity tolerance, sitting balance and bed mobility today. Continues to require increased 2 person assist for all aspects to mobility. Patient with improved ability to follow commands and express needs during session. Will continue to see and progress as tolerated.   Follow Up Recommendations  SNF     Equipment Recommendations  Other (comment)    Recommendations for Other Services Rehab consult     Precautions / Restrictions Precautions Precautions: Fall Precaution Comments: lumbar drain Restrictions Weight Bearing Restrictions: No    Mobility  Bed Mobility Overal bed mobility: Needs Assistance Bed Mobility: Rolling;Sidelying to Sit Rolling: Mod assist;+2 for physical assistance (able to use UEs, able to roll sidelying to supine min assist) Sidelying to sit: Max assist;+2 for physical assistance Supine to sit: Max assist Sit to supine: Max assist Sit to sidelying: Max assist;Total assist;+2 for safety/equipment General bed mobility comments: pt continues assisting mostly with UE's, but therapist progressing slowly with task to give pt time to react with trunk.. Tolerated increased bed mobility today for additional hygiene and pericare.    Transfers Overall transfer level:  Needs assistance Equipment used: 2 person hand held assist (face to face with gait belt and chuck pad) Transfers: Sit to/from [employer] Transfers Sit to Stand: Max assist;Total assist;+2 physical assistance   Squat pivot transfers: +2 physical assistance;Total assist     General transfer comment: Increased tacilite cues to elevate to upright, bilateral Knee block and faciliation of trunk extension required  Ambulation/Gait             General Gait Details: not able yet   Stairs            Wheelchair Mobility    Modified Rankin (Stroke Patients Only) Modified Rankin (Stroke Patients Only) Pre-Morbid Rankin Score: No symptoms Modified Rankin: Severe disability     Balance Overall balance assessment: Needs assistance Sitting-balance support: Feet supported Sitting balance-Leahy Scale: Poor Sitting balance - Comments: Pt able to maintain EOB sitting ~10 mins with min/mod facilitation to trunk control and balance  Postural control: Posterior lean   Standing balance-Leahy Scale: Poor Standing balance comment: able to sit on BSC with min guard today                            Cognition Arousal/Alertness: Awake/alert Behavior During Therapy: Flat affect Overall Cognitive Status: Impaired/Different from baseline Area of Impairment: Attention;Following commands;Problem solving                 Orientation Level: Disoriented to;Place;Time;Situation Current Attention Level: Sustained   Following Commands: Follows one step commands inconsistently;Follows one step commands with increased time Safety/Judgement: Decreased awareness of deficits Awareness: Intellectual Problem Solving: Slow processing;Decreased initiation;Difficulty sequencing;Requires verbal cues;Requires tactile cues General Comments: slowed responses, some ?apraxias      Exercises      General Comments  Pertinent Vitals/Pain Pain Assessment: Faces Faces Pain  Scale: Hurts even more Pain Location: buttocks during bed mobility and dressing change Pain Descriptors / Indicators: Aching;Discomfort;Grimacing Pain Intervention(s): Monitored during session    Home Living                      Prior Function            PT Goals (current goals can now be found in the care plan section) Acute Rehab PT Goals Patient Stated Goal: to stand and walk Progress towards PT goals: Progressing toward goals    Frequency    Min 3X/week      PT Plan Current plan remains appropriate    Co-evaluation PT/OT/SLP Co-Evaluation/Treatment: Yes Reason for Co-Treatment: Complexity of the patient's impairments (multi-system involvement) PT goals addressed during session: Mobility/safety with mobility        AM-PAC PT "6 Clicks" Daily Activity  Outcome Measure  Difficulty turning over in bed (including adjusting bedclothes, sheets and blankets)?: Total Difficulty moving from lying on back to sitting on the side of the bed? : Total Difficulty sitting down on and standing up from a chair with arms (e.g., wheelchair, bedside commode, etc,.)?: Total Help needed moving to and from a bed to chair (including a wheelchair)?: Total Help needed walking in hospital room?: Total Help needed climbing 3-5 steps with a railing? : Total 6 Click Score: 6    End of Session   Activity Tolerance: Patient tolerated treatment well Patient left: in chair;with call bell/phone within reach;with chair alarm set Nurse Communication: Mobility status PT Visit Diagnosis: Muscle weakness (generalized) (M62.81);Other abnormalities of gait and mobility (R26.89);Hemiplegia and hemiparesis Hemiplegia - Right/Left: Left Hemiplegia - dominant/non-dominant: Non-dominant Hemiplegia - caused by: Cerebral infarction     Time: 9767-3419 PT Time Calculation (min) (ACUTE ONLY): 33 min  Charges:  $Therapeutic Activity: 8-22 mins                    G Codes:       Alben Deeds,  PT DPT  [phone]    Duncan Dull 01/23/2017, 10:37 AM

## 2017-01-23 NOTE — Progress Notes (Signed)
Occupational Therapy Treatment Patient Details Name: William Pena MRN: [id number] DOB: 01/23/1986 Today's Date: 01/23/2017    History of present illness Patient is a 31 y/o male presents with sepsis secondary to perirectal abscess s/p I&D 5/23 and disseminated cryptococcal infection including meningitis, bacteremia, and likely pneumonia. Intubated 5/28-6/5. Pt found to have acute ischemic strokes. 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 Second MRI- extension of the diffusion restriction in the left basal ganglia and right basal ganglia. s/p Lumbar drain 5/30. PMH includes advanced hiv disease   OT comments  Pt aware of need to have BM, requesting to go to the bathroom. Assisted to BSC>bed for pericare>chair with +2 assist. Pt able to drink from cup with set up, mom reports encouraging finger feeding, but pt not yet successful. Pt making verbal requests and following commands with increased time and multimodal cues.   Follow Up Recommendations  SNF    Equipment Recommendations  None recommended by OT    Recommendations for Other Services      Precautions / Restrictions Precautions Precautions: Fall Precaution Comments: multiple wounds on buttocks, lower back Restrictions Weight Bearing Restrictions: No       Mobility Bed Mobility Overal bed mobility: Needs Assistance Bed Mobility: Rolling;Sidelying to Sit;Sit to Supine Rolling: Mod assist;+2 for physical assistance Sidelying to sit: Max assist;+2 for physical assistance  Sit to supine: Max assist  General bed mobility comments: pt continues assisting mostly with UE's, but therapist progressing slowly with task to give pt time to react with trunk.. Tolerated increased bed mobility today for additional hygiene and pericare.    Transfers Overall transfer level: Needs assistance Equipment used: 2 person hand held assist Transfers: Sit to/from [employer] Sit to Stand: Max assist;Total assist;+2 physical assistance   Squat pivot transfers: +2 physical assistance;Total assist     General transfer comment: Increased tactile cues to elevate to upright, bilateral Knee block and faciliation of trunk extension required    Balance Overall balance assessment: Needs assistance Sitting-balance support: Feet supported Sitting balance-Leahy Scale: Poor Sitting balance - Comments: Pt able to maintain EOB sitting ~10 mins with min/mod facilitation to trunk control and balance  Postural control: Posterior lean   Standing balance-Leahy Scale: Poor Standing balance comment: able to sit on BSC with min guard today, pillows propped behind                           ADL either performed or assessed with clinical judgement   ADL Overall ADL's : Needs assistance/impaired Eating/Feeding: Supervision/ safety;Sitting Eating/Feeding Details (indicate cue type and reason): drinking from cup with straw                     Toilet Transfer: Maximal assistance;+2 for physical assistance;BSC   Toileting- Clothing Manipulation and Hygiene: Total assistance;+2 for physical assistance;Sitting/lateral lean;Sit to/from stand Toileting - Clothing Manipulation Details (indicate cue type and reason): completed more thorough pericare at bed level       General ADL Comments: Pt calling his RN's name to request pain meds. Using UEs for bed mobility and drinking, discontinued soft elbow splint use.     Vision       Perception     Praxis      Cognition Arousal/Alertness: Awake/alert Behavior During Therapy: Flat affect Overall Cognitive Status: Impaired/Different from baseline Area of Impairment: Attention;Following commands;Problem solving  Orientation Level: Disoriented to;Place;Time;Situation Current Attention Level: Sustained   Following Commands: Follows one step commands inconsistently;Follows one step  commands with increased time (with multimodal cues) Safety/Judgement: Decreased awareness of deficits Awareness: Intellectual Problem Solving: Slow processing;Decreased initiation;Difficulty sequencing;Requires verbal cues;Requires tactile cues General Comments: slowed responses        Exercises    Shoulder Instructions       General Comments      Pertinent Vitals/ Pain       Pain Assessment: Faces Faces Pain Scale: Hurts even more Pain Location: buttocks during bed mobility and dressing change Pain Descriptors / Indicators: Aching;Discomfort;Grimacing Pain Intervention(s): Patient requesting pain meds-RN notified;Repositioned;Limited activity within patient's tolerance  Home Living Family/patient expects to be discharged to:: Inpatient rehab                                        Prior Functioning/Environment              Frequency  Min 3X/week        Progress Toward Goals  OT Goals(current goals can now be found in the care plan section)  Progress towards OT goals: Progressing toward goals  Acute Rehab OT Goals Patient Stated Goal: to stand and walk OT Goal Formulation: With patient Time For Goal Achievement: 01/25/17 Potential to Achieve Goals: Good  Plan Discharge plan remains appropriate    Co-evaluation    PT/OT/SLP Co-Evaluation/Treatment: Yes Reason for Co-Treatment: Complexity of the patient's impairments (multi-system involvement);For patient/therapist safety PT goals addressed during session: Mobility/safety with mobility OT goals addressed during session: ADL's and self-care      AM-PAC PT "6 Clicks" Daily Activity     Outcome Measure   Help from another person eating meals?: A Lot Help from another person taking care of personal grooming?: A Lot Help from another person toileting, which includes using toliet, bedpan, or urinal?: Total Help from another person bathing (including washing, rinsing, drying)?: Total Help  from another person to put on and taking off regular upper body clothing?: Total Help from another person to put on and taking off regular lower body clothing?: Total 6 Click Score: 8    End of Session    OT Visit Diagnosis: Cognitive communication deficit (R41.841);Apraxia (R48.2);Muscle weakness (generalized) (M62.81) Symptoms and signs involving cognitive functions: Cerebral infarction Pain - Right/Left: Left Pain - part of body: Shoulder   Activity Tolerance Patient tolerated treatment well   Patient Left in chair;with call bell/phone within reach;with chair alarm set;with family/visitor present;with nursing/sitter in room   Nurse Communication Need for lift equipment;Mobility status        Time: 5621-3086 OT Time Calculation (min): 40 min  Charges: OT General Charges $OT Visit: 1 Procedure OT Treatments $Self Care/Home Management : 8-22 mins $Therapeutic Activity: 8-22 mins   Malka So 01/23/2017, 11:31 AM  [phone]

## 2017-01-23 NOTE — Progress Notes (Signed)
Patient ID: William Pena, male   DOB: 08/11/1985, 31 y.o.   MRN: [id number]          [facility]  Date of Admission:  12/20/2016   Total days of cryptococcal therapy 33        Day 5  fluconazole         ASSESSMENT: He remains lethargic but otherwise stable. His opening pressure was elevated at the time of his last LP 2 days ago. Otherwise CSF parameters are steadily improving. His CSF culture on 01/05/2017 was negative. The CSF obtained 2 days ago was negative so far. I will continue fluconazole for now. He probably needs a repeat LP to recheck his pressure in the next few days.  PLAN: 1. 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 Elevated CP = please plan to get repeat LP to see if CSF pressure still elevated to decide if need to get VP shunt 3. Right arm contracture = please have physical therapy work more with patient to help him do more exercises to prevent contracture 4. Severe protein calorie malnutrition = please continue to have NT help feed the patient and help encourage additional drink  5. HIV disease = continue with oi proph and plan to start HIV treatment likely at end of the week or next week once we decide if candidate for VP shunt  Principal Problem:   Perirectal abscess Active Problems:   Chronic pain   AIDS (acquired immune deficiency syndrome) (HCC)   Hyponatremia   Normocytic anemia   Elevated LFTs   Pulmonary infiltrates   Hypomagnesemia   Encephalopathy acute   SIRS (systemic inflammatory response syndrome) (HCC)   Acute respiratory failure (HCC)   Rectal abscess   Cryptococcal meningoencephalitis (HCC)   Cryptococcosis (HCC)   Cavitary pneumonia   Diffuse lymphadenopathy   Drug rash   Elevated intracranial pressure   HIV disease (HCC)   Lymphadenopathy of head and neck   HIV (human immunodeficiency virus infection) ([place])   Meningitis   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 amantadine  100 mg Oral BID WC  . azithromycin  1,200 mg Oral Weekly  . feeding supplement  1 Container Oral TID BM  . feeding supplement (ENSURE ENLIVE)  237 mL Oral TID BM  . magnesium oxide  400 mg Oral BID  . mouth rinse  15 mL Mouth Rinse BID  . multivitamin with minerals  1 tablet Oral Daily  . senna-docusate  2 tablet Oral BID  . sodium chloride flush  3 mL Intravenous Q12H  . sulfamethoxazole-trimethoprim  1 tablet Oral Daily  . tamsulosin  0.4 mg Oral Daily   Review of Systems: Review of Systems  Unable to perform ROS: Mental acuity  not able to do full ROS but states that his buttock hurts  Allergies  Allergen Reactions  . Augmentin [Amoxicillin-Pot Clavulanate]     "break out"    OBJECTIVE: Vitals:   01/22/17 1549 01/22/17 2153 01/23/17 0615 01/23/17 1428  BP: 113/76 112/72 119/84 106/73  Pulse: 79 75 69 80  Resp: 14 17 18 14   Temp: 98.6 F (37 C) 98.2 F (36.8 C) 98.1 F (36.7 C)   TempSrc: Oral Oral Oral   SpO2: 100% 98% 100% 99%  Weight:      Height:       Body mass  index is 22.06 kg/m.  Physical Exam  Constitutional: [name] well-developed, well-nourished, and in no distress.  [name] lying quietly in bed with his eyes open. He answers questions slowly and quietly. No family are present.  Neck: Neck supple.  Cardiovascular: Normal rate and regular rhythm.   No murmur heard. Pulmonary/Chest: Effort normal and breath sounds normal. He has no wheezes. He has no rales.  Abdominal: Soft. There is no tenderness.  Neurological: [name] alert. Coordination abnormal.  Right arm contracture, does not fully extend right arm but is able to try to reach out with redirection    Lab Results Lab Results  Component Value Date   WBC 4.9 01/19/2017   HGB 8.1 (L) 01/19/2017   HCT 25.2 (L) 01/19/2017   MCV 86.3 01/19/2017   PLT 122 (L) 01/19/2017    Lab Results  Component Value Date    CREATININE 0.91 01/23/2017   BUN 13 01/23/2017   NA 133 (L) 01/23/2017   K 3.9 01/23/2017   CL 103 01/23/2017   CO2 26 01/23/2017    Lab Results  Component Value Date   ALT 41 01/20/2017   AST 45 (H) 01/20/2017   ALKPHOS 63 01/20/2017   BILITOT 0.5 01/20/2017     Microbiology: Recent Results (from the past 240 hour(s))  CSF culture     Status: None   Collection Time: 01/20/17 11:38 AM  Result Value Ref Range Status   Specimen Description CSF  Final   Special Requests TUBE 2  Final   Gram Stain   Final    CYTOSPIN SMEAR WBC PRESENT, PREDOMINANTLY MONONUCLEAR ENCAPSULATED YEAST SEEN CRITICAL RESULT CALLED TO, READ BACK BY AND VERIFIED WITH: Ardelia Mems RN 13:40 01/20/17 ([email or web address])    Culture NO GROWTH 3 DAYS  Final   Report Status 01/23/2017 FINAL  Final  Fungus Culture With Stain     Status: None (Preliminary result)   Collection Time: 01/20/17 11:38 AM  Result Value Ref Range Status   Fungus Stain Final report  Final    Comment: (NOTE) Performed At: [facility] [place], [place] [id number] Lindon Romp MD PO:[id number]    Fungus (Mycology) Culture PENDING  Incomplete   Fungal Source CSF  Final    Comment: TUBE 2  Fungus Culture Result     Status: None   Collection Time: 01/20/17 11:38 AM  Result Value Ref Range Status   Result 1 Comment  Final    Comment: (NOTE) KOH/Calcofluor preparation:  no fungus observed. Performed At: [facility] [place], [place] [id number] Lindon Romp MD YP:[id number]     Carlyle Basques, [facility] [place] [facility] [phone] 01/23/2017, 7:01 PM

## 2017-01-24 DIAGNOSIS — G8929 Other chronic pain: Secondary | ICD-10-CM

## 2017-01-24 LAB — MAGNESIUM: MAGNESIUM: 1.4 mg/dL — AB (ref 1.7–2.4)

## 2017-01-24 LAB — RENAL FUNCTION PANEL
ALBUMIN: 2.3 g/dL — AB (ref 3.5–5.0)
Anion gap: 5 (ref 5–15)
BUN: 15 mg/dL (ref 6–20)
CALCIUM: 8.6 mg/dL — AB (ref 8.9–10.3)
CHLORIDE: 104 mmol/L (ref 101–111)
CO2: 25 mmol/L (ref 22–32)
CREATININE: 0.93 mg/dL (ref 0.61–1.24)
Glucose, Bld: 142 mg/dL — ABNORMAL HIGH (ref 65–99)
Phosphorus: 3.8 mg/dL (ref 2.5–4.6)
Potassium: 3.8 mmol/L (ref 3.5–5.1)
SODIUM: 134 mmol/L — AB (ref 135–145)

## 2017-01-24 MED ORDER — MORPHINE SULFATE (PF) 2 MG/ML IV SOLN
2.0000 mg | Freq: Four times a day (QID) | INTRAVENOUS | Status: DC | PRN
  Administered 2017-01-24 – 2017-02-05 (×18): 2 mg via INTRAVENOUS
  Filled 2017-01-24 (×19): qty 1

## 2017-01-24 MED ORDER — FLUCONAZOLE 100 MG PO TABS
400.0000 mg | ORAL_TABLET | Freq: Every day | ORAL | Status: DC
  Administered 2017-01-24 – 2017-02-06 (×14): 400 mg via ORAL
  Filled 2017-01-24 (×14): qty 4

## 2017-01-24 MED ORDER — ACETAMINOPHEN 650 MG RE SUPP: 650.0000 mg | Freq: Four times a day (QID) | RECTAL | Status: DC | PRN

## 2017-01-24 MED ORDER — HYDROCODONE-ACETAMINOPHEN 5-325 MG PO TABS
1.0000 | ORAL_TABLET | Freq: Four times a day (QID) | ORAL | Status: DC | PRN
  Administered 2017-01-25 – 2017-02-03 (×15): 1 via ORAL
  Filled 2017-01-24 (×18): qty 1

## 2017-01-24 MED ORDER — MAGNESIUM SULFATE 2 GM/50ML IV SOLN
2.0000 g | Freq: Once | INTRAVENOUS | Status: AC
  Administered 2017-01-24: 2 g via INTRAVENOUS
  Filled 2017-01-24: qty 50

## 2017-01-24 MED ORDER — ACETAMINOPHEN 160 MG/5ML PO SOLN: 650.0000 mg | Freq: Four times a day (QID) | ORAL | Status: DC | PRN

## 2017-01-24 NOTE — Progress Notes (Signed)
   01/24/17 1250  Clinical Encounter Type  Visited With Patient and family together  Visit Type Follow-up  Spiritual Encounters  Spiritual Needs Emotional  Stress Factors  Patient Stress Factors Health changes  Family Stress Factors Family relationships  Introduction to Pt and mother. Mother and Pt eating lunch. Referred her to chaplain for any follow up.

## 2017-01-24 NOTE — Progress Notes (Signed)
PROGRESS NOTE    William Pena  YIR:[id number] DOB: May 13, 1986 DOA: 12/20/2016 PCP: [name], MD   Chief Complaint  Patient presents with  . Rectal Bleeding  . Generalized Body Aches    Brief Narrative:  HPI on 12/20/2016 by Dr. Stark Klein (surgery) Pt is a 31 yo M with history of recurrent perirectal abscesses.  He was treated at [facility] with surgery in January by Dr. Ronita Hipps, but now is incarcerated in [place].  He has had multiple different types of drains, but currently just has a draining seton in place at the site of a fistula.  He has been to the ED 3 times in the last week.  Due to his current incarceration in [facility], he cannot be transferred to [place].  He reports severe rectal pain.  He has had fever to 103 and had sepsis workup this AM at [facility] long.  He did have a CT on 5/19 that showed small abscess and antibiotics were recommended.  He was not able to be compliant with that for unclear reasons.  He has also had abdominal pain. [name] also seen at [facility] for his AIDS by Dr. Tobie Poet.  [name] frequently homeless as well.  [name] reportedly on his HIV meds.  Last CD 4 count looks like 1 /microliter (10/04/2016) and last viral load looks like 50,936/mL (08/08/2016).  Interim history During admission he was also noted to have headache, fever, chills, night sweats, neck pain and lymphadenopathy. ID was consulted and recommended imaging of chest, LP and cultures be sent from perirectal I&D. Also his serum cryptococcal antigen titer was elevated raising the possibility of disseminated cryptococcal infection including lung involvement. Pulmonology was consulted by ID for bronchoscopy and washings in an effort to help determine if his CT changes reflect TB or possible pulmonary cryptococcal disease. Initially unable to perform bronchoscopy due to hypoxia. 5/28 patient had altered LOC, became very lethargic/obtunded requiring CT and LP, complicated by acute respiratory failure due to  altered mental status that required mechanical ventilation. He had a lumbar CSF drain to depression arise. Now extubated, drain removed. Failed swallowing evaluation and has Cor trak forte tube feeds. Mental status slightly better but waxing and waning. ID, neurology, neurosurgery and CCM consulted during hospital course. Care transferred to stepdown and [place] on 01/09/17. MS slowly improving. Cor Trak discontinued 6/12 and diet initiated. Transferred to medical bed 6/13.Acute kidney injury on 6/14 (creatinine 2.8), likely due to acute urinary retention & Amphotericin> resolved. 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 Lumbar puncture done on 01/20/2017 showed opening pressure of 31, better than before, it was 55 -CSF culture showed yeast -Discussed with infectious disease, Dr. Baxter Flattery. Patient will need repeat lumbar puncture as he did have an elevated opening pressure on previous lumbar puncture. -Discussed with neurology, recommended IR postprocedure. -Interventional radiology consulted and appreciated -If opening pressure remains elevated, patient will likely need VP shunt  Acute urinary retention -Continue Foley catheter and Flomax -Patient failed voiding trial twice  Acute kidney injury -Likely secondary to urinary retention and possibly amphotericin -Creatinine peaked to 2.84, currently 0.93 -Continue to monitor BMP  Acute hypoxic respiratory failure secondary cryptococcal pneumonia -Required intubation, was  extubated on 01/03/2017 -Patient currently on room air  Acute encephalopathy -Likely  multi-factorial including cryptococcal meningitis, acute strokes, metabolic arrangements, medication versus respiratory etiology -Has been improving slowly however patient does not appear to be back at baseline  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 -Neurology consulted and appreciated -No role for statins or antiplatelets in the setting continue antifungal treatment  HIV/AIDS -Infectious disease following -CD4 count on admission 21, high viral load greater than 1 million -Continue prophylaxis with Bactrim and azithromycin -ART not initiated due to high risk of IR IS, OD initiated in 1-2 weeks per ID  Perirectal abscess with pain -Patient initially admitted by general surgery, status post IND -Continue dressing changes twice a day -Continue wound care -Will add on IV pain control, continue oral needed  Genital herpes -Completed valacyclovir  Hypomagnesemia -magnesium 1.4, will replace with IV and oral supplementation and continue to monitor   Diarrhea -Stool culture negative, resolved  Anemia of chronic disease -Plus hemoglobin 8.1 -Continue to monitor CBC  Hypertension -Continue IV hydralazine as needed  Thrombocytopenia -Unknown etiology, possibly medication effect -Currently stable, no reports of bleeding -Platelets 122 -Continue to monitor CBC  Facial swelling Appears to be improving possibly related to hypoalbuminemia -If does not improve may repeat noncontrasted head CT  Constipation -Continue the regimen  DVT Prophylaxis  SCDs  Code Status: Full  Family Communication: None at bedside  Disposition Plan: Admitted, will likely need SNF  Consultants PCCM Infectious disease Interventional radiology General surgery Neurology neurosurgery  Procedures  Lumbar puncture Lumbar drain placement on 5/28, discontinued 6/10 Intubation/extubation Cor Trak discontinued 01/10/2017  Antibiotics     Anti-infectives    Start     Dose/Rate Route Frequency Ordered Stop   01/24/17 1100  fluconazole (DIFLUCAN) tablet 400 mg     400 mg Oral Daily 01/24/17 1059     01/19/17 1530  fluconazole (DIFLUCAN) IVPB 400 mg  Status:  Discontinued     400 mg 100 mL/hr over 120 Minutes Intravenous Every 24 hours 01/19/17 1442 01/24/17 1059   01/16/17 2200  sulfamethoxazole-trimethoprim (BACTRIM DS,SEPTRA DS) 800-160 MG per tablet 1 tablet     1 tablet Oral Daily 01/16/17 2035     01/13/17 1000  sulfamethoxazole-trimethoprim (BACTRIM,SEPTRA) 200-40 MG/5ML suspension 10 mL  Status:  Discontinued     10 mL Oral Daily 01/12/17 0952 01/12/17 1149   01/12/17 1300  amphotericin B liposome (AMBISOME) 240 mg in dextrose 5 % 500 mL IVPB  Status:  Discontinued     240 mg 250 mL/hr over 120 Minutes Intravenous Every 24 hours 01/12/17 1155 01/12/17 1203   01/12/17 1300  amphotericin B liposome (AMBISOME) 240 mg in dextrose 5 % 500 mL IVPB  Status:  Discontinued     240 mg 250 mL/hr over 120 Minutes Intravenous Every 24 hours 01/12/17 1203 01/19/17 1442   01/12/17 1200  flucytosine (ANCOBON) capsule 1,500 mg  Status:  Discontinued     1,500 mg Oral Every 6 hours 01/12/17 0952 01/19/17 1442   01/06/17 1000  azithromycin (ZITHROMAX) tablet 1,200 mg     1,200 mg Oral Weekly 01/04/17 2339     01/06/17 1000  amphotericin B liposome (AMBISOME) 240 mg in dextrose 5 % 500 mL IVPB  Status:  Discontinued     4 mg/kg  59.8 kg 250 mL/hr over 120 Minutes Intravenous Every 24 hours 01/06/17 0815 01/12/17 1155   12/31/16 1000  sulfamethoxazole-trimethoprim (BACTRIM,SEPTRA) 200-40 MG/5ML suspension 10 mL  Status:  Discontinued     10 mL Per Tube Daily 12/30/16 1418 01/12/17 0952   12/31/16 1000  amphotericin B liposome (AMBISOME) 270 mg in dextrose 5 % 500 mL IVPB  Status:  Discontinued     4 mg/kg  67.6 kg 250 mL/hr over 120 Minutes Intravenous Every 24 hours 12/30/16 1510 01/06/17 0815   12/30/16 1000  azithromycin  (ZITHROMAX) 200 MG/5ML suspension 1,200 mg  Status:  Discontinued     1,200 mg Per Tube Every Fri 12/26/16 2030 01/04/17 2339   12/29/16 1000  sulfamethoxazole-trimethoprim (BACTRIM,SEPTRA) 200-40 MG/5ML suspension 20 mL  Status:  Discontinued     20 mL Per Tube Daily 12/28/16 1226 12/30/16 1418   12/28/16 1000  amphotericin B liposome (AMBISOME) 240 mg in dextrose 5 % 500 mL IVPB  Status:  Discontinued     3.5 mg/kg  67.6 kg 250 mL/hr over 120 Minutes Intravenous Every 24 hours 12/28/16 0853 12/30/16 1510   12/28/16 0900  amphotericin B liposome (AMBISOME) 240 mg in dextrose 5 % 500 mL IVPB  Status:  Discontinued     3.5 mg/kg  67.6 kg 250 mL/hr over 120 Minutes Intravenous Every 24 hours 12/28/16 0832 12/28/16 0853   12/27/16 0000  flucytosine (ANCOBON) capsule 1,500 mg  Status:  Discontinued     1,500 mg Per Tube Every 6 hours 12/26/16 1909 01/12/17 0952   12/26/16 2200  sulfamethoxazole-trimethoprim (BACTRIM DS,SEPTRA DS) 800-160 MG per tablet 1 tablet  Status:  Discontinued     1 tablet Per Tube Every 12 hours 12/26/16 1909 12/26/16 1912   12/26/16 2200  valACYclovir (VALTREX) tablet 1,000 mg     1,000 mg Per Tube 2 times daily 12/26/16 1909 01/01/17 2359   12/26/16 2200  sulfamethoxazole-trimethoprim (BACTRIM,SEPTRA) 200-40 MG/5ML suspension 20 mL  Status:  Discontinued     20 mL Per Tube Every 12 hours 12/26/16 1912 12/28/16 1226   12/23/16 2200  sulfamethoxazole-trimethoprim (BACTRIM DS,SEPTRA DS) 800-160 MG per tablet 1 tablet  Status:  Discontinued     1 tablet Oral Every 12 hours 12/23/16 1822 12/26/16 1909   12/23/16 1900  cefTRIAXone (ROCEPHIN) 2 g in dextrose 5 % 50 mL IVPB  Status:  Discontinued     2 g 100 mL/hr over 30 Minutes Intravenous Every 24 hours 12/23/16 1821 12/28/16 1122   12/23/16 0900  amphotericin B liposome (AMBISOME) 200 mg in dextrose 5 % 500 mL IVPB  Status:  Discontinued     200 mg 250 mL/hr over 120 Minutes Intravenous Every 24 hours 12/22/16 0919  12/28/16 0832   12/23/16 0800  azithromycin (ZITHROMAX) tablet 1,200 mg  Status:  Discontinued     1,200 mg Oral Weekly 12/22/16 0811 12/26/16 2029   12/22/16 2200  valACYclovir (VALTREX) tablet 1,000 mg  Status:  Discontinued     1,000 mg Oral 2 times daily 12/22/16 1611 12/26/16 1909   12/22/16 1800  amoxicillin-clavulanate (AUGMENTIN) 875-125 MG per tablet 1 tablet  Status:  Discontinued     1 tablet Oral 2 times daily with meals 12/22/16 1446 12/23/16 1821   12/22/16 1800  doxycycline (VIBRA-TABS) tablet 100 mg  Status:  Discontinued     100 mg Oral 2 times daily with meals 12/22/16 1446 12/23/16 1821   12/22/16 1200  flucytosine (ANCOBON) capsule 1,500 mg  Status:  Discontinued     1,500 mg Oral Every 6 hours 12/22/16 0614 12/26/16 1909   12/22/16 1000  azithromycin (ZITHROMAX) tablet 250 mg  Status:  Discontinued  250 mg Oral Daily 12/21/16 0224 12/21/16 1131   12/22/16 1000  doxycycline (VIBRA-TABS) tablet 100 mg  Status:  Discontinued     100 mg Oral Every 12 hours 12/22/16 0942 12/22/16 1446   12/22/16 0000  amphotericin B liposome (AMBISOME) 190 mg in dextrose 5 % 500 mL IVPB  Status:  Discontinued     3 mg/kg  64.2 kg 250 mL/hr over 120 Minutes Intravenous Every 24 hours 12/21/16 2313 12/22/16 0919   12/22/16 0000  flucytosine (ANCOBON) capsule 250 mg  Status:  Discontinued     250 mg Oral Every 6 hours 12/21/16 2314 12/21/16 2325   12/22/16 0000  flucytosine (ANCOBON) capsule 1,500 mg  Status:  Discontinued     1,500 mg Oral Every 6 hours 12/21/16 2326 12/22/16 0614   12/21/16 1645  cefTRIAXone (ROCEPHIN) 2 g in dextrose 5 % 50 mL IVPB  Status:  Discontinued     2 g 100 mL/hr over 30 Minutes Intravenous Every 24 hours 12/21/16 1539 12/22/16 1446   12/21/16 1645  metroNIDAZOLE (FLAGYL) IVPB 500 mg  Status:  Discontinued     500 mg 100 mL/hr over 60 Minutes Intravenous Every 8 hours 12/21/16 1539 12/21/16 2348   12/21/16 1600  cefTRIAXone (ROCEPHIN) 2 g in dextrose 5 % 50 mL  IVPB  Status:  Discontinued     2 g 100 mL/hr over 30 Minutes Intravenous Every 24 hours 12/21/16 1131 12/21/16 1558   12/21/16 1400  vancomycin (VANCOCIN) IVPB 750 mg/150 ml premix  Status:  Discontinued     750 mg 150 mL/hr over 60 Minutes Intravenous Every 8 hours 12/21/16 0142 12/22/16 0942   12/21/16 1000  sulfamethoxazole-trimethoprim (BACTRIM DS,SEPTRA DS) 800-160 MG per tablet 1 tablet  Status:  Discontinued     1 tablet Oral Daily 12/21/16 0058 12/21/16 0100   12/21/16 1000  abacavir-dolutegravir-lamiVUDine (TRIUMEQ) 600-50-300 MG per tablet 1 tablet  Status:  Discontinued     1 tablet Oral Daily 12/21/16 0204 12/22/16 0903   12/21/16 1000  sulfamethoxazole-trimethoprim (BACTRIM DS,SEPTRA DS) 800-160 MG per tablet 1 tablet  Status:  Discontinued     1 tablet Oral Daily 12/21/16 0204 12/23/16 1822   12/21/16 0800  ceFEPIme (MAXIPIME) 2 g in dextrose 5 % 50 mL IVPB  Status:  Discontinued     2 g 100 mL/hr over 30 Minutes Intravenous Every 8 hours 12/21/16 0137 12/21/16 1131   12/21/16 0600  piperacillin-tazobactam (ZOSYN) IVPB 3.375 g  Status:  Discontinued     3.375 g 12.5 mL/hr over 240 Minutes Intravenous Every 8 hours 12/21/16 0058 12/21/16 0132   12/21/16 0200  metroNIDAZOLE (FLAGYL) IVPB 500 mg  Status:  Discontinued     500 mg 100 mL/hr over 60 Minutes Intravenous Every 8 hours 12/21/16 0137 12/22/16 1446   12/21/16 0145  ceFEPIme (MAXIPIME) 2 g in dextrose 5 % 50 mL IVPB     2 g 100 mL/hr over 30 Minutes Intravenous  Once 12/21/16 0137 12/21/16 0358   12/21/16 0145  vancomycin (VANCOCIN) IVPB 1000 mg/200 mL premix     1,000 mg 200 mL/hr over 60 Minutes Intravenous  Once 12/21/16 0142 12/21/16 0429   12/21/16 0130  piperacillin-tazobactam (ZOSYN) IVPB 3.375 g  Status:  Discontinued     3.375 g 100 mL/hr over 30 Minutes Intravenous  Once 12/21/16 0115 12/21/16 0132   12/21/16 0130  azithromycin (ZITHROMAX) powder 1 g     1 g Oral  Once 12/21/16 0125 12/21/16 0328  Subjective:   William Pena seen and examined today.  Complains of buttock and rectal pain, 10 out of 10, sharp. Denies chest pain, shortness of breath, abdominal pain, nausea or vomiting, current diarrhea. States he had a bowel movement yesterday. Denies headache or dizziness.  Objective:   Vitals:   01/23/17 0615 01/23/17 1428 01/23/17 2131 01/24/17 0618  BP: 119/84 106/73 125/82 116/66  Pulse: 69 80 78 76  Resp: 18 14 18 18   Temp: 98.1 F (36.7 C)  97.5 F (36.4 C) 98.5 F (36.9 C)  TempSrc: Oral  Axillary Oral  SpO2: 100% 99% 97% 98%  Weight:      Height:        Intake/Output Summary (Last 24 hours) at 01/24/17 1356 Last data filed at 01/24/17 1051  Gross per 24 hour  Intake          3165.33 ml  Output             2000 ml  Net          1165.33 ml   Filed Weights   01/10/17 0900 01/11/17 0640 01/11/17 0905  Weight: 63 kg (138 lb 14.2 oz) 60 kg (132 lb 4.4 oz) 62 kg (136 lb 11 oz)    Exam  General: Well developed, well nourished, NAD, appears stated age  HEENT: NCAT,  mucous membranes moist.   Cardiovascular: S1 S2 auscultated, no rubs, murmurs or gallops. Regular rate and rhythm.  Respiratory: Clear to auscultation bilaterally with equal chest rise  Abdomen: Soft, nontender, nondistended, + bowel sounds  Extremities: warm dry without cyanosis clubbing or edema. RUE contracture, not able to fully extend  Neuro: AAOx3, Nonfocal  Skin: Multiple tattoos, dry  Psych: appropriate   Data Reviewed: I have personally reviewed following labs and imaging studies  CBC:  Recent Labs Lab 01/19/17 0618  WBC 4.9  HGB 8.1*  HCT 25.2*  MCV 86.3  PLT 671*   Basic Metabolic Panel:  Recent Labs Lab 01/20/17 0510 01/21/17 0358 01/22/17 0538 01/23/17 0638 01/24/17 0950  NA 139 135 134* 133* 134*  K 4.1 4.0 4.2 3.9 3.8  CL 114* 108 105 103 104  CO2 21* 22 24 26 25   GLUCOSE 90 86 83 85 142*  BUN 17 14 13 13 15   CREATININE 1.14 0.95 0.95 0.91  0.93  CALCIUM 8.9 8.8* 8.7* 8.6* 8.6*  MG 1.8 1.2* 1.2* 1.4* 1.4*  PHOS 5.5* 4.9* 4.7* 4.5 3.8   GFR: Estimated Creatinine Clearance: 100.9 mL/min (by C-G formula based on SCr of 0.93 mg/dL). Liver Function Tests:  Recent Labs Lab 01/20/17 1230 01/21/17 0358 01/22/17 0538 01/23/17 2458 01/24/17 0950  AST 45*  --   --   --   --   ALT 41  --   --   --   --   ALKPHOS 63  --   --   --   --   BILITOT 0.5  --   --   --   --   PROT 8.5*  --   --   --   --   ALBUMIN 2.1* 2.1* 2.1* 2.2* 2.3*   No results for input(s): LIPASE, AMYLASE in the last 168 hours. No results for input(s): AMMONIA in the last 168 hours. Coagulation Profile: No results for input(s): INR, PROTIME in the last 168 hours. Cardiac Enzymes: No results for input(s): CKTOTAL, CKMB, CKMBINDEX, TROPONINI in the last 168 hours. BNP (last 3 results) No results for input(s): PROBNP in the last  8760 hours. HbA1C: No results for input(s): HGBA1C in the last 72 hours. CBG: No results for input(s): GLUCAP in the last 168 hours. Lipid Profile: No results for input(s): CHOL, HDL, LDLCALC, TRIG, CHOLHDL, LDLDIRECT in the last 72 hours. Thyroid Function Tests: No results for input(s): TSH, T4TOTAL, FREET4, T3FREE, THYROIDAB in the last 72 hours. Anemia Panel: No results for input(s): VITAMINB12, FOLATE, FERRITIN, TIBC, IRON, RETICCTPCT in the last 72 hours. Urine analysis:    Component Value Date/Time   COLORURINE YELLOW 12/20/2016 0706   APPEARANCEUR CLEAR 12/20/2016 0706   LABSPEC 1.026 12/20/2016 0706   PHURINE 6.0 12/20/2016 0706   GLUCOSEU NEGATIVE 12/20/2016 0706   HGBUR NEGATIVE 12/20/2016 0706   BILIRUBINUR NEGATIVE 12/20/2016 0706   KETONESUR NEGATIVE 12/20/2016 0706   PROTEINUR 100 (A) 12/20/2016 0706   NITRITE NEGATIVE 12/20/2016 0706   LEUKOCYTESUR NEGATIVE 12/20/2016 0706   Sepsis Labs: @LABRCNTIP (procalcitonin:4,lacticidven:4)  ) Recent Results (from the past 240 hour(s))  CSF culture     Status:  None   Collection Time: 01/20/17 11:38 AM  Result Value Ref Range Status   Specimen Description CSF  Final   Special Requests TUBE 2  Final   Gram Stain   Final    CYTOSPIN SMEAR WBC PRESENT, PREDOMINANTLY MONONUCLEAR ENCAPSULATED YEAST SEEN CRITICAL RESULT CALLED TO, READ BACK BY AND VERIFIED WITH: Ardelia Mems RN 13:40 01/20/17 ([email or web address])    Culture NO GROWTH 3 DAYS  Final   Report Status 01/23/2017 FINAL  Final  Fungus Culture With Stain     Status: None (Preliminary result)   Collection Time: 01/20/17 11:38 AM  Result Value Ref Range Status   Fungus Stain Final report  Final    Comment: (NOTE) Performed At: [facility] [place], [place] [id number] Lindon Romp MD ON:[id number]    Fungus (Mycology) Culture PENDING  Incomplete   Fungal Source CSF  Final    Comment: TUBE 2  Fungus Culture Result     Status: None   Collection Time: 01/20/17 11:38 AM  Result Value Ref Range Status   Result 1 Comment  Final    Comment: (NOTE) KOH/Calcofluor preparation:  no fungus observed. Performed At: [facility] [address] [place], [place] [id number] Lindon Romp MD DG:[id number]       Radiology Studies: No results found.   Scheduled Meds: . amantadine  100 mg Oral BID WC  . azithromycin  1,200 mg Oral Weekly  . feeding supplement  1 Container Oral TID BM  . feeding supplement (ENSURE ENLIVE)  237 mL Oral TID BM  . fluconazole  400 mg Oral Daily  . magnesium oxide  400 mg Oral BID  . mouth rinse  15 mL Mouth Rinse BID  . multivitamin with minerals  1 tablet Oral Daily  . senna-docusate  2 tablet Oral BID  . sodium chloride flush  3 mL Intravenous Q12H  . sulfamethoxazole-trimethoprim  1 tablet Oral Daily  . tamsulosin  0.4 mg Oral Daily   Continuous Infusions: . sodium chloride 50 mL/hr at 01/23/17 2228     LOS: 34 days   Time Spent in minutes   45  minutes  Maebel Marasco D.O. on 01/24/2017 at 1:56 PM  Between 7am to 7pm -  Pager - [phone]  After 7pm go to [email or web address] - password [email or web address]  And look for the night coverage person covering for me after hours  [facility] Office  [phone]

## 2017-01-24 NOTE — Progress Notes (Signed)
Nutrition Follow-up  DOCUMENTATION CODES:   Not applicable  INTERVENTION:   -Continue Ensure Enlive po TID, each supplement provides 350 kcal and 20 grams of protein -Continue MVI daily  NUTRITION DIAGNOSIS:   Increased nutrient needs related to wound healing as evidenced by estimated needs.  Ongoing  GOAL:   Patient will meet greater than or equal to 90% of their needs  Progressing  MONITOR:   PO intake, Supplement acceptance, Labs, Skin  REASON FOR ASSESSMENT:   Consult Calorie Count  ASSESSMENT:   Pt with PMH of HIV/AIDS (hx of noncompliance in part due to incarceration) and chronic perirectal abscess which pt had I&D Dec 2017, Jan 2018 but has worsened since being in jail for the last 6 weeks also without HIV meds during this time. Pt admitted with perirectal abscess had repeat I&D 5/23. 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 Lumbar drain placed 5/29.    6/19- pt upgraded to dysphagia 3 diet with nectar thick liquids  Pt receiving nursing care at time of visit.   Pt intake is improving (noted meal completion 25-75%, mainly averaging around 50% of meals). Pt also consuming Ensure supplements as ordered per MAR.   Labs reviewed: Na: 134 (on IV supplementation), Mg: 1.4 (on PO and IV supplementation).   Diet Order:  DIET DYS 3 Room service appropriate? Yes; Fluid consistency: Nectar Thick; Fluid restriction: 1500 mL Fluid  Skin:  Wound (see comment) (st 2 sacrum, opwn buttock wound, closed back and perineum )  Last BM:  01/23/17  Height:   Ht Readings from Last 1 Encounters:  12/27/16 5\' 6"  (1.676 m)    Weight:   Wt Readings from Last 1 Encounters:  01/11/17 136 lb 11 oz (62 kg)    Ideal Body Weight:  59 kg  BMI:  Body mass index is 22.06 kg/m.  Estimated Nutritional Needs:   Kcal:  1800-2000  Protein:  90-110 grams  Fluid:  > 1.8 L/day  EDUCATION NEEDS:   No education needs identified at this time  Laneah Luft  A. Jimmye Norman, RD, LDN, CDE Pager: [phone] After hours Pager: [phone]

## 2017-01-25 ENCOUNTER — Inpatient Hospital Stay (HOSPITAL_COMMUNITY): Payer: Medicaid Other

## 2017-01-25 DIAGNOSIS — J9690 Respiratory failure, unspecified, unspecified whether with hypoxia or hypercapnia: Secondary | ICD-10-CM

## 2017-01-25 DIAGNOSIS — R7989 Other specified abnormal findings of blood chemistry: Secondary | ICD-10-CM

## 2017-01-25 LAB — CBC
HEMATOCRIT: 26.2 % — AB (ref 39.0–52.0)
Hemoglobin: 8.2 g/dL — ABNORMAL LOW (ref 13.0–17.0)
MCH: 27.8 pg (ref 26.0–34.0)
MCHC: 31.3 g/dL (ref 30.0–36.0)
MCV: 88.8 fL (ref 78.0–100.0)
Platelets: 176 10*3/uL (ref 150–400)
RBC: 2.95 MIL/uL — ABNORMAL LOW (ref 4.22–5.81)
RDW: 17.3 % — AB (ref 11.5–15.5)
WBC: 2.2 10*3/uL — ABNORMAL LOW (ref 4.0–10.5)

## 2017-01-25 LAB — CSF CELL COUNT WITH DIFFERENTIAL
EOS CSF: 1 % (ref 0–1)
Lymphs, CSF: 87 % — ABNORMAL HIGH (ref 40–80)
MONOCYTE-MACROPHAGE-SPINAL FLUID: 12 % — AB (ref 15–45)
RBC COUNT CSF: 3 /mm3 — AB
Segmented Neutrophils-CSF: 0 % (ref 0–6)
Tube #: 1
WBC CSF: 10 /mm3 — AB (ref 0–5)

## 2017-01-25 LAB — RENAL FUNCTION PANEL
Albumin: 2.4 g/dL — ABNORMAL LOW (ref 3.5–5.0)
Anion gap: 6 (ref 5–15)
BUN: 13 mg/dL (ref 6–20)
CHLORIDE: 102 mmol/L (ref 101–111)
CO2: 24 mmol/L (ref 22–32)
Calcium: 9 mg/dL (ref 8.9–10.3)
Creatinine, Ser: 0.97 mg/dL (ref 0.61–1.24)
GFR calc Af Amer: >60 mL/min (ref >60)
GFR calc non Af Amer: >60 mL/min (ref >60)
GLUCOSE: 94 mg/dL (ref 65–99)
POTASSIUM: 4.3 mmol/L (ref 3.5–5.1)
Phosphorus: 4 mg/dL (ref 2.5–4.6)
Sodium: 132 mmol/L — ABNORMAL LOW (ref 135–145)

## 2017-01-25 LAB — MAGNESIUM: MAGNESIUM: 1.5 mg/dL — AB (ref 1.7–2.4)

## 2017-01-25 LAB — GLUCOSE, CSF: Glucose, CSF: 51 mg/dL (ref 40–70)

## 2017-01-25 LAB — PROTEIN, CSF: Total  Protein, CSF: 83 mg/dL — ABNORMAL HIGH (ref 15–45)

## 2017-01-25 MED ORDER — MAGNESIUM SULFATE 4 GM/100ML IV SOLN
4.0000 g | Freq: Once | INTRAVENOUS | Status: AC
  Administered 2017-01-25: 4 g via INTRAVENOUS
  Filled 2017-01-25: qty 100

## 2017-01-25 MED ORDER — LIDOCAINE HCL (PF) 1 % IJ SOLN
5.0000 mL | Freq: Once | INTRAMUSCULAR | Status: DC
  Filled 2017-01-25: qty 5

## 2017-01-25 MED ORDER — LIDOCAINE HCL 1 % IJ SOLN
INTRAMUSCULAR | Status: AC
  Filled 2017-01-25: qty 10

## 2017-01-25 NOTE — Progress Notes (Signed)
Physical Therapy Treatment Patient Details Name: William Pena MRN: [id number] DOB: 02/24/1986 Today's Date: 01/25/2017    History of Present Illness Patient is a 31 y/o male presents with sepsis secondary to perirectal abscess s/p I&D 5/23 and disseminated cryptococcal infection including meningitis, bacteremia, and likely pneumonia. Intubated 5/28-6/5. Pt found to have acute ischemic strokes. 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 Second MRI- extension of the diffusion restriction in the left basal ganglia and right basal ganglia. s/p Lumbar drain 5/30. PMH includes advanced hiv disease    PT Comments    Patient continues to require +2 assist for OOB mobility and mod A for bed mobility. Continue to progress as tolerated with anticipated d/c to SNF for further skilled PT services.     Follow Up Recommendations  SNF     Equipment Recommendations  Other (comment)    Recommendations for Other Services Rehab consult     Precautions / Restrictions Precautions Precautions: Fall Precaution Comments: lumbar drain Restrictions Weight Bearing Restrictions: No    Mobility  Bed Mobility Overal bed mobility: Needs Assistance Bed Mobility: Rolling;Sidelying to Sit Rolling: Min assist Sidelying to sit: +2 for physical assistance;Mod assist       General bed mobility comments: cues for sequencing; pt used bed rail to assist rolling into sidelying; assist at [place] and trunk to come to sitting; pt very tense and with rigid movements of all extremities with transition; hand over hand assist for hand placement at sides to assist with sitting balance  Transfers Overall transfer level: Needs assistance Equipment used: 2 person hand held assist (face to face with gait belt and chuck pad) Transfers: Sit to/from [employer] Transfers Sit to Stand: Max assist;+2 physical assistance   Squat pivot transfers: +2 physical  assistance     General transfer comment: assist to lift bottom from EOB with bilat knees blocked and use of bed pad; cues for sequencing and technique; pt assisted by holding onto therapist and tech's arms and leaning anteriorly  Ambulation/Gait             General Gait Details: not able yet   Stairs            Wheelchair Mobility    Modified Rankin (Stroke Patients Only) Modified Rankin (Stroke Patients Only) Pre-Morbid Rankin Score: No symptoms Modified Rankin: Severe disability     Balance Overall balance assessment: Needs assistance Sitting-balance support: Feet supported Sitting balance-Leahy Scale: Poor Sitting balance - Comments: mod/max A to maintain sitting balance EOB Postural control: Posterior lean;Right lateral lean   Standing balance-Leahy Scale: Zero                              Cognition Arousal/Alertness: Awake/alert Behavior During Therapy: Flat affect Overall Cognitive Status: Impaired/Different from baseline Area of Impairment: Attention;Following commands;Problem solving                 Orientation Level: Disoriented to;Time;Situation Current Attention Level: Sustained Memory: Decreased short-term memory Following Commands: Follows one step commands inconsistently;Follows one step commands with increased time   Awareness: Intellectual Problem Solving: Slow processing;Decreased initiation;Difficulty sequencing;Requires verbal cues;Requires tactile cues General Comments: slow processing however appears to be improved slightly from last session      Exercises      General Comments        Pertinent Vitals/Pain Pain Assessment: Faces Faces Pain Scale: Hurts even more Pain Location: buttocks with mobility Pain  Descriptors / Indicators: Discomfort;Grimacing;Sore Pain Intervention(s): Limited activity within patient's tolerance;Monitored during session;Premedicated before session;Repositioned;Patient requesting pain  meds-RN notified    Home Living                      Prior Function            PT Goals (current goals can now be found in the care plan section) Progress towards PT goals: Progressing toward goals    Frequency    Min 3X/week      PT Plan Current plan remains appropriate    Co-evaluation              AM-PAC PT "6 Clicks" Daily Activity  Outcome Measure  Difficulty turning over in bed (including adjusting bedclothes, sheets and blankets)?: Total Difficulty moving from lying on back to sitting on the side of the bed? : Total Difficulty sitting down on and standing up from a chair with arms (e.g., wheelchair, bedside commode, etc,.)?: Total Help needed moving to and from a bed to chair (including a wheelchair)?: Total Help needed walking in hospital room?: Total Help needed climbing 3-5 steps with a railing? : Total 6 Click Score: 6    End of Session Equipment Utilized During Treatment: Gait belt Activity Tolerance: Patient tolerated treatment well Patient left: in chair;with call bell/phone within reach;with chair alarm set Nurse Communication: Mobility status;Need for lift equipment PT Visit Diagnosis: Muscle weakness (generalized) (M62.81);Other abnormalities of gait and mobility (R26.89);Hemiplegia and hemiparesis Hemiplegia - Right/Left: Left Hemiplegia - dominant/non-dominant: Non-dominant Hemiplegia - caused by: Cerebral infarction     Time: 0922-0945 PT Time Calculation (min) (ACUTE ONLY): 23 min  Charges:  $Therapeutic Activity: 23-37 mins                    G Codes:       Earney Navy, PTA Pager: [phone]     Darliss Cheney 01/25/2017, 10:55 AM

## 2017-01-25 NOTE — Progress Notes (Signed)
Pt seen this am with breakfast, much improved swallow with no indications of airway compromise with po.  Full note to follow.  Will progress diet to regular/thin.    Luanna Salk, [place] [facility] SLP [phone]

## 2017-01-25 NOTE — Procedures (Signed)
Lumbar puncture performed at L2-L3.  Opening pressure 22 cm H20.  22 mL clear CSF obtained and sent to lab for testing.  Please see full dictation in PACS for additional details.

## 2017-01-25 NOTE — Progress Notes (Signed)
  Speech Language Pathology Treatment: Dysphagia  Patient Details Name: William Pena MRN: [id number] DOB: [birth year] Today's Date: 01/25/2017 Time: 0900-0920 SLP Time Calculation (min) (ACUTE ONLY): 20 min  Assessment / Plan / Recommendation Clinical Impression  Pt with much improved swallow ability, suspect near baseline at this time.  No overt indication of airway compromise with po including pancakes, sausage, nectar and thin juice. Mastication was rotary and functional.  Pt did not report difficulties with sensation of stasis.  As soon as advised that SLP would advance to regular to allow him to self feed sandwiches, etc easier - pt noted to clear his throat.  Question if this could be behavioral?   Pt's voice and cough are stronger today, he continues to require moderate cues/encouragement to speak loudly however.  Will advance to regular/thin diet with intermittent supervision- pt educated and agreeable to plan.   HPI HPI: Patient is a 31 y/o male presents with sepsis secondary to perirectal abscess s/p I&D 5/23 and disseminated cryptococcal infection including meningitis, bacteremia, and likely pneumonia. Intubated 5/28-6/5. 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 Second MRI- extension of the diffusion restriction in the left basal ganglia and right basal ganglia. s/p Lumbar drain 5/30. PMH includes advanced hiv disease      SLP Plan  Continue with current plan of care       Recommendations  Diet recommendations: Regular;Thin liquid Liquids provided via: Cup;Straw Medication Administration: Whole meds with puree Supervision: Staff to assist with self feeding;Full supervision/cueing for compensatory strategies Compensations: Minimize environmental distractions;Slow rate;Small sips/bites;Other (Comment) Postural Changes and/or Swallow Maneuvers: Seated upright 90 degrees;Upright 30-60 min after meal                Oral  Care Recommendations: Oral care BID Follow up Recommendations: 24 hour supervision/assistance SLP Visit Diagnosis: Dysphagia, oropharyngeal phase (R13.12) Plan: Continue with current plan of care       [place], [place], [place] [facility] SLP [phone]

## 2017-01-25 NOTE — Progress Notes (Signed)
PROGRESS NOTE    William Pena  NUU:[id number] DOB: [birth year] DOA: 12/20/2016 PCP: [name], MD   Chief Complaint  Patient presents with  . Rectal Bleeding  . Generalized Body Aches    Brief Narrative:  HPI on 12/20/2016 by Dr. Stark Klein (surgery) Pt is a 31 yo M with history of recurrent perirectal abscesses.  He was treated at [facility] with surgery in January by Dr. Ronita Hipps, but now is incarcerated in [place].  He has had multiple different types of drains, but currently just has a draining seton in place at the site of a fistula.  He has been to the ED 3 times in the last week.  Due to his current incarceration in [facility], he cannot be transferred to [place].  He reports severe rectal pain.  He has had fever to 103 and had sepsis workup this AM at [facility] long.  He did have a CT on 5/19 that showed small abscess and antibiotics were recommended.  He was not able to be compliant with that for unclear reasons.  He has also had abdominal pain. [name] also seen at [facility] for his AIDS by Dr. Tobie Poet.  [name] frequently homeless as well.  [name] reportedly on his HIV meds.  Last CD 4 count looks like 1 /microliter (10/04/2016) and last viral load looks like 50,936/mL (08/08/2016).  Interim history During admission he was also noted to have headache, fever, chills, night sweats, neck pain and lymphadenopathy. ID was consulted and recommended imaging of chest, LP and cultures be sent from perirectal I&D. Also his serum cryptococcal antigen titer was elevated raising the possibility of disseminated cryptococcal infection including lung involvement. Pulmonology was consulted by ID for bronchoscopy and washings in an effort to help determine if his CT changes reflect TB or possible pulmonary cryptococcal disease. Initially unable to perform bronchoscopy due to hypoxia. 5/28 patient had altered LOC, became very lethargic/obtunded requiring CT and LP, complicated by acute respiratory failure due to  altered mental status that required mechanical ventilation. He had a lumbar CSF drain to depression arise. Now extubated, drain removed. Failed swallowing evaluation and has Cor trak forte tube feeds. Mental status slightly better but waxing and waning. ID, neurology, neurosurgery and CCM consulted during hospital course. Care transferred to stepdown and [place] on 01/09/17. MS slowly improving. Cor Trak discontinued 6/12 and diet initiated. Transferred to medical bed 6/13.Acute kidney injury on 6/14 (creatinine 2.8), likely due to acute urinary retention & Amphotericin> resolved. 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 Lumbar puncture done on 01/20/2017 showed opening pressure of 31, better than before, it was 55 -CSF culture showed yeast -Discussed with infectious disease, Dr. Baxter Flattery. Patient will need repeat lumbar puncture as he did have an elevated opening pressure on previous lumbar puncture. -Discussed with neurology, recommended IR to perform procedure. -Interventional radiology consulted and appreciated. Lumbar puncture ordered again. -If opening pressure remains elevated, patient will likely need VP shunt  Acute urinary retention -Continue Foley catheter and Flomax -Patient failed voiding trial twice  Acute kidney injury -Likely secondary to urinary retention and possibly amphotericin -Creatinine peaked to 2.84, currently 0.97 -Continue to monitor BMP  Acute hypoxic respiratory failure  secondary cryptococcal pneumonia -Required intubation, was extubated on 01/03/2017 -Patient currently on room  air  Acute encephalopathy -Likely multi-factorial including cryptococcal meningitis, acute strokes, metabolic arrangements, medication versus respiratory etiology -Has been improving slowly. Not sure what patient's baseline is.  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 Patient continues to complain of 10/10 pain.  Genital herpes -Completed valacyclovir  Hypomagnesemia -magnesium 1.5, despite replacement. -Will continue to replace IV, once level has improved we'll continue with oral magnesium  Diarrhea -Stool culture negative, resolved  Anemia of chronic disease -Stable, hemoglobin 8.1 -Continue to monitor CBC  Hypertension -Continue IV hydralazine as needed  Thrombocytopenia -Unknown etiology, possibly medication effect -Currently stable, no reports of bleeding -Platelets 176 -Continue to monitor CBC  Facial swelling -Appears to be improving possibly related to hypoalbuminemia -If does not improve may repeat noncontrasted head CT  Constipation -Continue bowel regimen  Physical deconditioning -Compensated by patient's comorbidities as well as prolonged hospitalization.  -Physical therapy assessed the patient, recommending SNF -Speech therapy assessed patient, recommended regular diet, thin liquids  DVT Prophylaxis  SCDs  Code Status: Full  Family Communication:  None at bedside  Disposition Plan: Admitted. Dispo to SNF when stable.   Consultants PCCM Infectious disease Interventional radiology General surgery Neurology Neurosurgery  Procedures  Lumbar puncture Lumbar drain placement on 5/28, discontinued 6/10 Intubation/extubation Cor Trak discontinued 01/10/2017  Antibiotics   Anti-infectives    Start     Dose/Rate Route Frequency Ordered Stop   01/24/17 1100  fluconazole (DIFLUCAN) tablet 400 mg     400 mg Oral Daily 01/24/17 1059     01/19/17 1530  fluconazole (DIFLUCAN) IVPB 400 mg  Status:  Discontinued     400 mg 100 mL/hr over 120 Minutes Intravenous Every 24 hours 01/19/17 1442 01/24/17 1059   01/16/17 2200  sulfamethoxazole-trimethoprim (BACTRIM DS,SEPTRA DS) 800-160 MG per tablet 1 tablet     1 tablet Oral Daily 01/16/17 2035     01/13/17 1000  sulfamethoxazole-trimethoprim (BACTRIM,SEPTRA) 200-40 MG/5ML suspension 10 mL  Status:  Discontinued     10 mL Oral Daily 01/12/17 0952 01/12/17 1149   01/12/17 1300  amphotericin B liposome (AMBISOME) 240 mg in dextrose 5 % 500 mL IVPB  Status:  Discontinued     240 mg 250 mL/hr over 120 Minutes Intravenous Every 24 hours 01/12/17 1155 01/12/17 1203   01/12/17 1300  amphotericin B liposome (AMBISOME) 240 mg in dextrose 5 % 500 mL IVPB  Status:  Discontinued     240 mg 250 mL/hr over 120 Minutes Intravenous Every 24 hours 01/12/17 1203 01/19/17 1442   01/12/17 1200  flucytosine (ANCOBON) capsule 1,500 mg  Status:  Discontinued     1,500 mg Oral Every 6 hours 01/12/17 0952 01/19/17 1442   01/06/17 1000  azithromycin (ZITHROMAX) tablet 1,200 mg     1,200 mg Oral Weekly 01/04/17 2339     01/06/17 1000  amphotericin B liposome (AMBISOME) 240 mg in dextrose 5 %  500 mL IVPB  Status:  Discontinued     4 mg/kg  59.8 kg 250 mL/hr over 120 Minutes Intravenous Every 24 hours 01/06/17 0815 01/12/17 1155   12/31/16 1000  sulfamethoxazole-trimethoprim (BACTRIM,SEPTRA) 200-40 MG/5ML suspension  10 mL  Status:  Discontinued     10 mL Per Tube Daily 12/30/16 1418 01/12/17 0952   12/31/16 1000  amphotericin B liposome (AMBISOME) 270 mg in dextrose 5 % 500 mL IVPB  Status:  Discontinued     4 mg/kg  67.6 kg 250 mL/hr over 120 Minutes Intravenous Every 24 hours 12/30/16 1510 01/06/17 0815   12/30/16 1000  azithromycin (ZITHROMAX) 200 MG/5ML suspension 1,200 mg  Status:  Discontinued     1,200 mg Per Tube Every Fri 12/26/16 2030 01/04/17 2339   12/29/16 1000  sulfamethoxazole-trimethoprim (BACTRIM,SEPTRA) 200-40 MG/5ML suspension 20 mL  Status:  Discontinued     20 mL Per Tube Daily 12/28/16 1226 12/30/16 1418   12/28/16 1000  amphotericin B liposome (AMBISOME) 240 mg in dextrose 5 % 500 mL IVPB  Status:  Discontinued     3.5 mg/kg  67.6 kg 250 mL/hr over 120 Minutes Intravenous Every 24 hours 12/28/16 0853 12/30/16 1510   12/28/16 0900  amphotericin B liposome (AMBISOME) 240 mg in dextrose 5 % 500 mL IVPB  Status:  Discontinued     3.5 mg/kg  67.6 kg 250 mL/hr over 120 Minutes Intravenous Every 24 hours 12/28/16 0832 12/28/16 0853   12/27/16 0000  flucytosine (ANCOBON) capsule 1,500 mg  Status:  Discontinued     1,500 mg Per Tube Every 6 hours 12/26/16 1909 01/12/17 0952   12/26/16 2200  sulfamethoxazole-trimethoprim (BACTRIM DS,SEPTRA DS) 800-160 MG per tablet 1 tablet  Status:  Discontinued     1 tablet Per Tube Every 12 hours 12/26/16 1909 12/26/16 1912   12/26/16 2200  valACYclovir (VALTREX) tablet 1,000 mg     1,000 mg Per Tube 2 times daily 12/26/16 1909 01/01/17 2359   12/26/16 2200  sulfamethoxazole-trimethoprim (BACTRIM,SEPTRA) 200-40 MG/5ML suspension 20 mL  Status:  Discontinued     20 mL Per Tube Every 12 hours 12/26/16 1912 12/28/16 1226   12/23/16 2200  sulfamethoxazole-trimethoprim (BACTRIM DS,SEPTRA DS) 800-160 MG per tablet 1 tablet  Status:  Discontinued     1 tablet Oral Every 12 hours 12/23/16 1822 12/26/16 1909   12/23/16 1900  cefTRIAXone (ROCEPHIN) 2 g in  dextrose 5 % 50 mL IVPB  Status:  Discontinued     2 g 100 mL/hr over 30 Minutes Intravenous Every 24 hours 12/23/16 1821 12/28/16 1122   12/23/16 0900  amphotericin B liposome (AMBISOME) 200 mg in dextrose 5 % 500 mL IVPB  Status:  Discontinued     200 mg 250 mL/hr over 120 Minutes Intravenous Every 24 hours 12/22/16 0919 12/28/16 0832   12/23/16 0800  azithromycin (ZITHROMAX) tablet 1,200 mg  Status:  Discontinued     1,200 mg Oral Weekly 12/22/16 0811 12/26/16 2029   12/22/16 2200  valACYclovir (VALTREX) tablet 1,000 mg  Status:  Discontinued     1,000 mg Oral 2 times daily 12/22/16 1611 12/26/16 1909   12/22/16 1800  amoxicillin-clavulanate (AUGMENTIN) 875-125 MG per tablet 1 tablet  Status:  Discontinued     1 tablet Oral 2 times daily with meals 12/22/16 1446 12/23/16 1821   12/22/16 1800  doxycycline (VIBRA-TABS) tablet 100 mg  Status:  Discontinued     100 mg Oral 2 times daily with meals 12/22/16 1446 12/23/16 1821  12/22/16 1200  flucytosine (ANCOBON) capsule 1,500 mg  Status:  Discontinued     1,500 mg Oral Every 6 hours 12/22/16 0614 12/26/16 1909   12/22/16 1000  azithromycin (ZITHROMAX) tablet 250 mg  Status:  Discontinued     250 mg Oral Daily 12/21/16 0224 12/21/16 1131   12/22/16 1000  doxycycline (VIBRA-TABS) tablet 100 mg  Status:  Discontinued     100 mg Oral Every 12 hours 12/22/16 0942 12/22/16 1446   12/22/16 0000  amphotericin B liposome (AMBISOME) 190 mg in dextrose 5 % 500 mL IVPB  Status:  Discontinued     3 mg/kg  64.2 kg 250 mL/hr over 120 Minutes Intravenous Every 24 hours 12/21/16 2313 12/22/16 0919   12/22/16 0000  flucytosine (ANCOBON) capsule 250 mg  Status:  Discontinued     250 mg Oral Every 6 hours 12/21/16 2314 12/21/16 2325   12/22/16 0000  flucytosine (ANCOBON) capsule 1,500 mg  Status:  Discontinued     1,500 mg Oral Every 6 hours 12/21/16 2326 12/22/16 0614   12/21/16 1645  cefTRIAXone (ROCEPHIN) 2 g in dextrose 5 % 50 mL IVPB  Status:   Discontinued     2 g 100 mL/hr over 30 Minutes Intravenous Every 24 hours 12/21/16 1539 12/22/16 1446   12/21/16 1645  metroNIDAZOLE (FLAGYL) IVPB 500 mg  Status:  Discontinued     500 mg 100 mL/hr over 60 Minutes Intravenous Every 8 hours 12/21/16 1539 12/21/16 2348   12/21/16 1600  cefTRIAXone (ROCEPHIN) 2 g in dextrose 5 % 50 mL IVPB  Status:  Discontinued     2 g 100 mL/hr over 30 Minutes Intravenous Every 24 hours 12/21/16 1131 12/21/16 1558   12/21/16 1400  vancomycin (VANCOCIN) IVPB 750 mg/150 ml premix  Status:  Discontinued     750 mg 150 mL/hr over 60 Minutes Intravenous Every 8 hours 12/21/16 0142 12/22/16 0942   12/21/16 1000  sulfamethoxazole-trimethoprim (BACTRIM DS,SEPTRA DS) 800-160 MG per tablet 1 tablet  Status:  Discontinued     1 tablet Oral Daily 12/21/16 0058 12/21/16 0100   12/21/16 1000  abacavir-dolutegravir-lamiVUDine (TRIUMEQ) 600-50-300 MG per tablet 1 tablet  Status:  Discontinued     1 tablet Oral Daily 12/21/16 0204 12/22/16 0903   12/21/16 1000  sulfamethoxazole-trimethoprim (BACTRIM DS,SEPTRA DS) 800-160 MG per tablet 1 tablet  Status:  Discontinued     1 tablet Oral Daily 12/21/16 0204 12/23/16 1822   12/21/16 0800  ceFEPIme (MAXIPIME) 2 g in dextrose 5 % 50 mL IVPB  Status:  Discontinued     2 g 100 mL/hr over 30 Minutes Intravenous Every 8 hours 12/21/16 0137 12/21/16 1131   12/21/16 0600  piperacillin-tazobactam (ZOSYN) IVPB 3.375 g  Status:  Discontinued     3.375 g 12.5 mL/hr over 240 Minutes Intravenous Every 8 hours 12/21/16 0058 12/21/16 0132   12/21/16 0200  metroNIDAZOLE (FLAGYL) IVPB 500 mg  Status:  Discontinued     500 mg 100 mL/hr over 60 Minutes Intravenous Every 8 hours 12/21/16 0137 12/22/16 1446   12/21/16 0145  ceFEPIme (MAXIPIME) 2 g in dextrose 5 % 50 mL IVPB     2 g 100 mL/hr over 30 Minutes Intravenous  Once 12/21/16 0137 12/21/16 0358   12/21/16 0145  vancomycin (VANCOCIN) IVPB 1000 mg/200 mL premix     1,000 mg 200 mL/hr over  60 Minutes Intravenous  Once 12/21/16 0142 12/21/16 0429   12/21/16 0130  piperacillin-tazobactam (ZOSYN) IVPB 3.375 g  Status:  Discontinued     3.375 g 100 mL/hr over 30 Minutes Intravenous  Once 12/21/16 0115 12/21/16 0132   12/21/16 0130  azithromycin (ZITHROMAX) powder 1 g     1 g Oral  Once 12/21/16 0125 12/21/16 0328      Subjective:   William Pena seen and examined today.  Continues to complain of 10/10 buttock/rectal pain. Denies chest pain, shortness of breath, abdominal pain, diarrhea or constipation.  Objective:   Vitals:   01/23/17 2131 01/24/17 0618 01/24/17 1533 01/24/17 2108  BP: 125/82 116/66 106/64 108/69  Pulse: 78 76 95 85  Resp: 18 18 17 18   Temp: 97.5 F (36.4 C) 98.5 F (36.9 C) 99 F (37.2 C) 98 F (36.7 C)  TempSrc: Axillary Oral  Oral  SpO2: 97% 98% 100% 99%  Weight:      Height:        Intake/Output Summary (Last 24 hours) at 01/25/17 1134 Last data filed at 01/25/17 8315  Gross per 24 hour  Intake              850 ml  Output              700 ml  Net              150 ml   Filed Weights   01/10/17 0900 01/11/17 0640 01/11/17 0905  Weight: 63 kg (138 lb 14.2 oz) 60 kg (132 lb 4.4 oz) 62 kg (136 lb 11 oz)   Exam  General: Well developed, well nourished, NAD, appears stated age   HEENT: NCAT, mucous membranes moist.   Cardiovascular: S1 S2 auscultated, RRR, no murmurs  Respiratory: Clear to auscultation bilaterally with equal chest rise  Abdomen: Soft, nontender, nondistended, + bowel sounds  Extremities: warm dry without cyanosis clubbing or edema  Neuro: AAOx3, nonfocal, quiet and slow to reply  Skin: Without rashes exudates or nodules, multiple tattoos  Psych: Appropriate mood and affect  Data Reviewed: I have personally reviewed following labs and imaging studies  CBC:  Recent Labs Lab 01/19/17 0618 01/25/17 0809  WBC 4.9 2.2*  HGB 8.1* 8.2*  HCT 25.2* 26.2*  MCV 86.3 88.8  PLT 122* 176   Basic Metabolic  Panel:  Recent Labs Lab 01/21/17 0358 01/22/17 0538 01/23/17 0638 01/24/17 0950 01/25/17 0809  NA 135 134* 133* 134* 132*  K 4.0 4.2 3.9 3.8 4.3  CL 108 105 103 104 102  CO2 22 24 26 25 24   GLUCOSE 86 83 85 142* 94  BUN 14 13 13 15 13   CREATININE 0.95 0.95 0.91 0.93 0.97  CALCIUM 8.8* 8.7* 8.6* 8.6* 9.0  MG 1.2* 1.2* 1.4* 1.4* 1.5*  PHOS 4.9* 4.7* 4.5 3.8 4.0   GFR: Estimated Creatinine Clearance: 96.8 mL/min (by C-G formula based on SCr of 0.97 mg/dL). Liver Function Tests:  Recent Labs Lab 01/20/17 1230 01/21/17 0358 01/22/17 0538 01/23/17 1607 01/24/17 0950 01/25/17 0809  AST 45*  --   --   --   --   --   ALT 41  --   --   --   --   --   ALKPHOS 63  --   --   --   --   --   BILITOT 0.5  --   --   --   --   --   PROT 8.5*  --   --   --   --   --   ALBUMIN 2.1* 2.1* 2.1* 2.2* 2.3*  2.4*   No results for input(s): LIPASE, AMYLASE in the last 168 hours. No results for input(s): AMMONIA in the last 168 hours. Coagulation Profile: No results for input(s): INR, PROTIME in the last 168 hours. Cardiac Enzymes: No results for input(s): CKTOTAL, CKMB, CKMBINDEX, TROPONINI in the last 168 hours. BNP (last 3 results) No results for input(s): PROBNP in the last 8760 hours. HbA1C: No results for input(s): HGBA1C in the last 72 hours. CBG: No results for input(s): GLUCAP in the last 168 hours. Lipid Profile: No results for input(s): CHOL, HDL, LDLCALC, TRIG, CHOLHDL, LDLDIRECT in the last 72 hours. Thyroid Function Tests: No results for input(s): TSH, T4TOTAL, FREET4, T3FREE, THYROIDAB in the last 72 hours. Anemia Panel: No results for input(s): VITAMINB12, FOLATE, FERRITIN, TIBC, IRON, RETICCTPCT in the last 72 hours. Urine analysis:    Component Value Date/Time   COLORURINE YELLOW 12/20/2016 0706   APPEARANCEUR CLEAR 12/20/2016 0706   LABSPEC 1.026 12/20/2016 0706   PHURINE 6.0 12/20/2016 0706   GLUCOSEU NEGATIVE 12/20/2016 0706   HGBUR NEGATIVE 12/20/2016 0706    BILIRUBINUR NEGATIVE 12/20/2016 0706   KETONESUR NEGATIVE 12/20/2016 0706   PROTEINUR 100 (A) 12/20/2016 0706   NITRITE NEGATIVE 12/20/2016 0706   LEUKOCYTESUR NEGATIVE 12/20/2016 0706   Sepsis Labs: @LABRCNTIP (procalcitonin:4,lacticidven:4)  ) Recent Results (from the past 240 hour(s))  CSF culture     Status: None   Collection Time: 01/20/17 11:38 AM  Result Value Ref Range Status   Specimen Description CSF  Final   Special Requests TUBE 2  Final   Gram Stain   Final    CYTOSPIN SMEAR WBC PRESENT, PREDOMINANTLY MONONUCLEAR ENCAPSULATED YEAST SEEN CRITICAL RESULT CALLED TO, READ BACK BY AND VERIFIED WITH: Ardelia Mems RN 13:40 01/20/17 ([email or web address])    Culture NO GROWTH 3 DAYS  Final   Report Status 01/23/2017 FINAL  Final  Fungus Culture With Stain     Status: None (Preliminary result)   Collection Time: 01/20/17 11:38 AM  Result Value Ref Range Status   Fungus Stain Final report  Final    Comment: (NOTE) Performed At: [facility] [place], [place] [id number] Lindon Romp MD [id number]    Fungus (Mycology) Culture PENDING  Incomplete   Fungal Source CSF  Final    Comment: TUBE 2  Fungus Culture Result     Status: None   Collection Time: 01/20/17 11:38 AM  Result Value Ref Range Status   Result 1 Comment  Final    Comment: (NOTE) KOH/Calcofluor preparation:  no fungus observed. Performed At: [facility] [address] [place], [place] [id number] Lindon Romp MD [id number]       Radiology Studies: No results found.   Scheduled Meds: . amantadine  100 mg Oral BID WC  . azithromycin  1,200 mg Oral Weekly  . feeding supplement  1 Container Oral TID BM  . feeding supplement (ENSURE ENLIVE)  237 mL Oral TID BM  . fluconazole  400 mg Oral Daily  . magnesium oxide  400 mg Oral BID  . mouth rinse  15 mL Mouth Rinse BID  . multivitamin with minerals  1 tablet Oral Daily  . senna-docusate  2 tablet Oral BID  . sodium chloride  flush  3 mL Intravenous Q12H  . sulfamethoxazole-trimethoprim  1 tablet Oral Daily  . tamsulosin  0.4 mg Oral Daily   Continuous Infusions: . sodium chloride 50 mL/hr at 01/24/17 1754     LOS: 35 days   Time  Spent in minutes   45  minutes  Christino Mcglinchey D.O. on 01/25/2017 at 11:34 AM  Between 7am to 7pm - Pager - [phone]  After 7pm go to [email or web address] - password [email or web address]  And look for the night coverage person covering for me after hours  [facility] Office  [phone]

## 2017-01-26 LAB — RENAL FUNCTION PANEL
ANION GAP: 5 (ref 5–15)
Albumin: 2.5 g/dL — ABNORMAL LOW (ref 3.5–5.0)
BUN: 13 mg/dL (ref 6–20)
CO2: 25 mmol/L (ref 22–32)
Calcium: 8.9 mg/dL (ref 8.9–10.3)
Chloride: 102 mmol/L (ref 101–111)
Creatinine, Ser: 0.97 mg/dL (ref 0.61–1.24)
GFR calc Af Amer: >60 mL/min (ref >60)
GFR calc non Af Amer: >60 mL/min (ref >60)
GLUCOSE: 82 mg/dL (ref 65–99)
POTASSIUM: 4.6 mmol/L (ref 3.5–5.1)
Phosphorus: 5.3 mg/dL — ABNORMAL HIGH (ref 2.5–4.6)
Sodium: 132 mmol/L — ABNORMAL LOW (ref 135–145)

## 2017-01-26 LAB — CBC
HEMATOCRIT: 25.4 % — AB (ref 39.0–52.0)
HEMOGLOBIN: 8 g/dL — AB (ref 13.0–17.0)
MCH: 28 pg (ref 26.0–34.0)
MCHC: 31.5 g/dL (ref 30.0–36.0)
MCV: 88.8 fL (ref 78.0–100.0)
Platelets: 190 10*3/uL (ref 150–400)
RBC: 2.86 MIL/uL — ABNORMAL LOW (ref 4.22–5.81)
RDW: 17.3 % — AB (ref 11.5–15.5)
WBC: 2.2 10*3/uL — ABNORMAL LOW (ref 4.0–10.5)

## 2017-01-26 LAB — MAGNESIUM: Magnesium: 1.8 mg/dL (ref 1.7–2.4)

## 2017-01-26 MED ORDER — VALACYCLOVIR HCL 500 MG PO TABS
1000.0000 mg | ORAL_TABLET | Freq: Two times a day (BID) | ORAL | Status: AC
  Administered 2017-01-26 – 2017-01-29 (×7): 1000 mg via ORAL
  Filled 2017-01-26 (×7): qty 2

## 2017-01-26 NOTE — Progress Notes (Signed)
Patient remains on SNF Difficult to Place list.  William Pena [facility] [phone]

## 2017-01-26 NOTE — Progress Notes (Signed)
[facility]    Date of Admission:  12/20/2016   Total days of antibiotics 34        Day 8  fluconazole        Day 30 oi proph   ID: William Pena is a 31 y.o. male with advanced hiv disease, disseminated CM Principal Problem:   Perirectal abscess Active Problems:   Chronic pain   AIDS (acquired immune deficiency syndrome) (HCC)   Hyponatremia   Normocytic anemia   Elevated LFTs   Pulmonary infiltrates   Hypomagnesemia   Encephalopathy acute   SIRS (systemic inflammatory response syndrome) (HCC)   Acute respiratory failure (HCC)   Rectal abscess   Cryptococcal meningoencephalitis (HCC)   Cryptococcosis (HCC)   Cavitary pneumonia   Diffuse lymphadenopathy   Drug rash   Elevated intracranial pressure   HIV disease (HCC)   Lymphadenopathy of head and neck   HIV (human immunodeficiency virus infection) ([place])   Meningitis   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   Medications:  . amantadine  100 mg Oral BID WC  . azithromycin  1,200 mg Oral Weekly  . feeding supplement  1 Container Oral TID BM  . feeding supplement (ENSURE ENLIVE)  237 mL Oral TID BM  . fluconazole  400 mg Oral Daily  . lidocaine (PF)  5 mL Intradermal Once  . magnesium oxide  400 mg Oral BID  . mouth rinse  15 mL Mouth Rinse BID  . multivitamin with minerals  1 tablet Oral Daily  . senna-docusate  2 tablet Oral BID  . sodium chloride flush  3 mL Intravenous Q12H  . sulfamethoxazole-trimethoprim  1 tablet Oral Daily  . tamsulosin  0.4 mg Oral Daily    Objective: Vital signs in last 24 hours: Temp:  [97.8 F (36.6 C)-98.6 F (37 C)] 98.6 F (37 C) (06/28 1324) Pulse Rate:  [75-82] 76 (06/28 1324) Resp:  [16-18] 16 (06/28 1324) BP:  (100-107)/(66-72) 107/72 (06/28 1324) SpO2:  [100 %] 100 % (06/28 1324) Physical Exam  Constitutional: [name] sleeping. Easily awakens. He appears well-developed and well-nourished. No distress.  HENT: no conjunctivities Mouth/Throat: Oropharynx is clear and moist. No oropharyngeal exudate.  Abdominal: Soft. Bowel sounds are normal. He exhibits no distension. There is no tenderness.  Neuro = slow to follow commands but follows roughly 60-75% of the time. He has natural contraction of right arm but is able to extend with some mild contraction. Right leg proximal weakness but is able to dorsiflex bilaterally Skin: Skin is warm and dry. No rash noted. No erythema.   Lab Results  Recent Labs  01/25/17 0809 01/26/17 0613  WBC 2.2* 2.2*  HGB 8.2* 8.0*  HCT 26.2* 25.4*  NA 132* 132*  K 4.3 4.6  CL 102 102  CO2 24 25  BUN 13 13  CREATININE 0.97 0.97   Liver Panel  Recent Labs  01/25/17 0809 01/26/17 0613  ALBUMIN 2.4* 2.5*    Microbiology: 6/7 csf no growth, but yeast on gram stain Studies/Results: Dg Fluoro Guide Lumbar Puncture  Result Date: 01/25/2017 Etheleen Mayhew, MD     01/25/2017  1:46 PM Lumbar puncture performed at L2-L3.  Opening pressure 22 cm H20.  22 mL clear CSF obtained and sent to lab for testing.  Please see full dictation in PACS for additional details.     Assessment/Plan: Disseminated cryptococcal meningitis with vascular infarct = started on consolidation phase with fluconazole 400mg  daily by mouth Will check hepatic panel. Fungal cx on 6/1 shows +fungal elements on gram stain but cx stil negative  High opening pressure on LP = repeat OP is slightly elevated at 22. For now, we will defer getting VP shunt since patient is asymptomatic and alert, not complaining of headache. Mentation is alert.   HSV penile lesions = will treat with valtrex 1gm BID x 7days then place on chronic suppression  Medication monitoring = kidney function is back to  normal.  oi proph = continue on bactrim daily plus weekly azithromycin  hiv disease= holding off initiation of ART due to high risk for IRIS, plan to initiate next 1-2 weeks  Malnutrition = encourse to drink 3 ensure enlive TID  Decondition and neuro insult from CM = continue with PT and OT, treat as I would advocate to treat as one would for a patient that needs stroke rehab   [facility], [facility] Cell: [phone] Pager: [phone]  01/26/2017, 3:27 PM

## 2017-01-26 NOTE — Progress Notes (Signed)
PROGRESS NOTE    William Pena  PJA:[id number] DOB: [birth year] DOA: 12/20/2016 PCP: [name], MD   Chief Complaint  Patient presents with  . Rectal Bleeding  . Generalized Body Aches    Brief Narrative:  HPI on 12/20/2016 by Dr. Stark Klein (surgery) Pt is a 31 yo M with history of recurrent perirectal abscesses.  He was treated at [facility] with surgery in January by Dr. Ronita Hipps, but now is incarcerated in [place].  He has had multiple different types of drains, but currently just has a draining seton in place at the site of a fistula.  He has been to the ED 3 times in the last week.  Due to his current incarceration in [facility], he cannot be transferred to [place].  He reports severe rectal pain.  He has had fever to 103 and had sepsis workup this AM at [facility] long.  He did have a CT on 5/19 that showed small abscess and antibiotics were recommended.  He was not able to be compliant with that for unclear reasons.  He has also had abdominal pain. [name] also seen at [facility] for his AIDS by Dr. Tobie Poet.  [name] frequently homeless as well.  [name] reportedly on his HIV meds.  Last CD 4 count looks like 1 /microliter (10/04/2016) and last viral load looks like 50,936/mL (08/08/2016).  Interim history During admission he was also noted to have headache, fever, chills, night sweats, neck pain and lymphadenopathy. ID was consulted and recommended imaging of chest, LP and cultures be sent from perirectal I&D. Also his serum cryptococcal antigen titer was elevated raising the possibility of disseminated cryptococcal infection including lung involvement. Pulmonology was consulted by ID for bronchoscopy and washings in an effort to help determine if his CT changes reflect TB or possible pulmonary cryptococcal disease. Initially unable to perform bronchoscopy due to hypoxia. 5/28 patient had altered LOC, became very lethargic/obtunded requiring CT and LP, complicated by acute respiratory failure due to  altered mental status that required mechanical ventilation. He had a lumbar CSF drain to depression arise. Now extubated, drain removed. Failed swallowing evaluation and has Cor trak forte tube feeds. Mental status slightly better but waxing and waning. ID, neurology, neurosurgery and CCM consulted during hospital course. Care transferred to stepdown and [place] on 01/09/17. MS slowly improving. Cor Trak discontinued 6/12 and diet initiated. Transferred to medical bed 6/13.Acute kidney injury on 6/14 (creatinine 2.8), likely due to acute urinary retention & Amphotericin> resolved. 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 Lumbar puncture done on 01/20/2017 showed opening pressure of 31, better than before, it was 55 -CSF culture showed yeast -Discussed with infectious disease, Dr. Baxter Flattery. Patient will need repeat lumbar puncture as he did have an elevated opening pressure on previous lumbar puncture. -Interventional radiology consult appreciated, status post lumbar puncture on 01/25/2017. Opening pressure 22. CSF obtained and sent to the lab, results pending. -Discussed with Dr. Baxter Flattery. She will discuss possible VP shunt with neurosurgery, Dr. Kathyrn Sheriff.   Acute urinary retention -Continue Foley catheter and Flomax -Patient failed voiding trial twice  Acute kidney injury -Likely secondary to urinary retention and possibly amphotericin -Creatinine peaked to 2.84, currently 0.97 -  Continue to monitor BMP  Acute hypoxic respiratory failure secondary cryptococcal pneumonia -Required intubation, was extubated on  01/03/2017 -Patient currently on room air  Acute encephalopathy -Likely multi-factorial including cryptococcal meningitis, acute strokes, metabolic arrangements, medication versus respiratory etiology -Has been improving slowly. Not sure what patient's baseline is.  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 -Neurology consulted and appreciated -No role for statins or antiplatelets in the setting continue antifungal treatment  HIV/AIDS -Infectious disease following -CD4 count on admission 21, high viral load greater than 1 million -Continue prophylaxis with Bactrim and azithromycin -ART not initiated due to high risk of IR IS, OD initiated in 1-2 weeks per ID  Perirectal abscess with pain -Patient initially admitted by general surgery, status post IND -Continue dressing changes twice a day -Continue wound care -Continue pain control with both IV and oral formulations  Genital herpes -Completed valacyclovir  Hypomagnesemia -Magnesium 1.8 today -We'll continue to monitor and replace as needed  Diarrhea -Stool culture negative, resolved  Anemia of chronic disease -Stable, hemoglobin 8.0 -Continue to monitor CBC  Hypertension -Continue IV hydralazine as needed  Thrombocytopenia -Unknown etiology, possibly medication effect -Currently stable, no reports of bleeding -Platelets 190 -Continue to monitor CBC  Facial swelling -Appears to be improving possibly related to hypoalbuminemia -If does not improve may repeat noncontrasted head CT  Constipation -Continue bowel regimen  Physical deconditioning -Compensated by patient's comorbidities as well as prolonged hospitalization.  -Physical therapy assessed the patient, recommending SNF -Speech therapy assessed patient, recommended regular diet, thin liquids  DVT Prophylaxis  SCDs  Code Status: Full  Family Communication: Mother at bedside  Disposition Plan:  Admitted. Dispo to SNF when stable.   Consultants PCCM Infectious disease Interventional radiology General surgery Neurology Neurosurgery  Procedures  Lumbar puncture Lumbar drain placement on 5/28, discontinued 6/10 Intubation/extubation Cor Trak discontinued 01/10/2017 Lumbar puncture by interventional radiology on 01/25/2017  Antibiotics   Anti-infectives    Start     Dose/Rate Route Frequency Ordered Stop   01/24/17 1100  fluconazole (DIFLUCAN) tablet 400 mg     400 mg Oral Daily 01/24/17 1059     01/19/17 1530  fluconazole (DIFLUCAN) IVPB 400 mg  Status:  Discontinued     400 mg 100 mL/hr over 120 Minutes Intravenous Every 24 hours 01/19/17 1442 01/24/17 1059   01/16/17 2200  sulfamethoxazole-trimethoprim (BACTRIM DS,SEPTRA DS) 800-160 MG per tablet 1 tablet     1 tablet Oral Daily 01/16/17 2035     01/13/17 1000  sulfamethoxazole-trimethoprim (BACTRIM,SEPTRA) 200-40 MG/5ML suspension 10 mL  Status:  Discontinued     10 mL Oral Daily 01/12/17 0952 01/12/17 1149   01/12/17 1300  amphotericin B liposome (AMBISOME) 240 mg in dextrose 5 % 500 mL IVPB  Status:  Discontinued     240 mg 250 mL/hr over 120 Minutes Intravenous Every 24 hours 01/12/17 1155 01/12/17 1203   01/12/17 1300  amphotericin B liposome (AMBISOME) 240 mg in dextrose 5 % 500 mL IVPB  Status:  Discontinued     240 mg 250 mL/hr over 120 Minutes Intravenous Every 24 hours 01/12/17 1203 01/19/17 1442   01/12/17 1200  flucytosine (ANCOBON) capsule 1,500 mg  Status:  Discontinued     1,500 mg Oral Every 6 hours 01/12/17 0952 01/19/17 1442   01/06/17 1000  azithromycin (ZITHROMAX) tablet 1,200 mg     1,200 mg Oral Weekly 01/04/17 2339     01/06/17 1000  amphotericin B liposome (AMBISOME) 240 mg in  dextrose 5 % 500 mL IVPB  Status:  Discontinued     4 mg/kg  59.8 kg 250 mL/hr over 120 Minutes Intravenous Every 24 hours 01/06/17 0815 01/12/17 1155   12/31/16 1000  sulfamethoxazole-trimethoprim (BACTRIM,SEPTRA)  200-40 MG/5ML suspension 10 mL  Status:  Discontinued     10 mL Per Tube Daily 12/30/16 1418 01/12/17 0952   12/31/16 1000  amphotericin B liposome (AMBISOME) 270 mg in dextrose 5 % 500 mL IVPB  Status:  Discontinued     4 mg/kg  67.6 kg 250 mL/hr over 120 Minutes Intravenous Every 24 hours 12/30/16 1510 01/06/17 0815   12/30/16 1000  azithromycin (ZITHROMAX) 200 MG/5ML suspension 1,200 mg  Status:  Discontinued     1,200 mg Per Tube Every Fri 12/26/16 2030 01/04/17 2339   12/29/16 1000  sulfamethoxazole-trimethoprim (BACTRIM,SEPTRA) 200-40 MG/5ML suspension 20 mL  Status:  Discontinued     20 mL Per Tube Daily 12/28/16 1226 12/30/16 1418   12/28/16 1000  amphotericin B liposome (AMBISOME) 240 mg in dextrose 5 % 500 mL IVPB  Status:  Discontinued     3.5 mg/kg  67.6 kg 250 mL/hr over 120 Minutes Intravenous Every 24 hours 12/28/16 0853 12/30/16 1510   12/28/16 0900  amphotericin B liposome (AMBISOME) 240 mg in dextrose 5 % 500 mL IVPB  Status:  Discontinued     3.5 mg/kg  67.6 kg 250 mL/hr over 120 Minutes Intravenous Every 24 hours 12/28/16 0832 12/28/16 0853   12/27/16 0000  flucytosine (ANCOBON) capsule 1,500 mg  Status:  Discontinued     1,500 mg Per Tube Every 6 hours 12/26/16 1909 01/12/17 0952   12/26/16 2200  sulfamethoxazole-trimethoprim (BACTRIM DS,SEPTRA DS) 800-160 MG per tablet 1 tablet  Status:  Discontinued     1 tablet Per Tube Every 12 hours 12/26/16 1909 12/26/16 1912   12/26/16 2200  valACYclovir (VALTREX) tablet 1,000 mg     1,000 mg Per Tube 2 times daily 12/26/16 1909 01/01/17 2359   12/26/16 2200  sulfamethoxazole-trimethoprim (BACTRIM,SEPTRA) 200-40 MG/5ML suspension 20 mL  Status:  Discontinued     20 mL Per Tube Every 12 hours 12/26/16 1912 12/28/16 1226   12/23/16 2200  sulfamethoxazole-trimethoprim (BACTRIM DS,SEPTRA DS) 800-160 MG per tablet 1 tablet  Status:  Discontinued     1 tablet Oral Every 12 hours 12/23/16 1822 12/26/16 1909   12/23/16 1900   cefTRIAXone (ROCEPHIN) 2 g in dextrose 5 % 50 mL IVPB  Status:  Discontinued     2 g 100 mL/hr over 30 Minutes Intravenous Every 24 hours 12/23/16 1821 12/28/16 1122   12/23/16 0900  amphotericin B liposome (AMBISOME) 200 mg in dextrose 5 % 500 mL IVPB  Status:  Discontinued     200 mg 250 mL/hr over 120 Minutes Intravenous Every 24 hours 12/22/16 0919 12/28/16 0832   12/23/16 0800  azithromycin (ZITHROMAX) tablet 1,200 mg  Status:  Discontinued     1,200 mg Oral Weekly 12/22/16 0811 12/26/16 2029   12/22/16 2200  valACYclovir (VALTREX) tablet 1,000 mg  Status:  Discontinued     1,000 mg Oral 2 times daily 12/22/16 1611 12/26/16 1909   12/22/16 1800  amoxicillin-clavulanate (AUGMENTIN) 875-125 MG per tablet 1 tablet  Status:  Discontinued     1 tablet Oral 2 times daily with meals 12/22/16 1446 12/23/16 1821   12/22/16 1800  doxycycline (VIBRA-TABS) tablet 100 mg  Status:  Discontinued     100 mg Oral 2 times daily with meals 12/22/16  1446 12/23/16 1821   12/22/16 1200  flucytosine (ANCOBON) capsule 1,500 mg  Status:  Discontinued     1,500 mg Oral Every 6 hours 12/22/16 0614 12/26/16 1909   12/22/16 1000  azithromycin (ZITHROMAX) tablet 250 mg  Status:  Discontinued     250 mg Oral Daily 12/21/16 0224 12/21/16 1131   12/22/16 1000  doxycycline (VIBRA-TABS) tablet 100 mg  Status:  Discontinued     100 mg Oral Every 12 hours 12/22/16 0942 12/22/16 1446   12/22/16 0000  amphotericin B liposome (AMBISOME) 190 mg in dextrose 5 % 500 mL IVPB  Status:  Discontinued     3 mg/kg  64.2 kg 250 mL/hr over 120 Minutes Intravenous Every 24 hours 12/21/16 2313 12/22/16 0919   12/22/16 0000  flucytosine (ANCOBON) capsule 250 mg  Status:  Discontinued     250 mg Oral Every 6 hours 12/21/16 2314 12/21/16 2325   12/22/16 0000  flucytosine (ANCOBON) capsule 1,500 mg  Status:  Discontinued     1,500 mg Oral Every 6 hours 12/21/16 2326 12/22/16 0614   12/21/16 1645  cefTRIAXone (ROCEPHIN) 2 g in dextrose 5 %  50 mL IVPB  Status:  Discontinued     2 g 100 mL/hr over 30 Minutes Intravenous Every 24 hours 12/21/16 1539 12/22/16 1446   12/21/16 1645  metroNIDAZOLE (FLAGYL) IVPB 500 mg  Status:  Discontinued     500 mg 100 mL/hr over 60 Minutes Intravenous Every 8 hours 12/21/16 1539 12/21/16 2348   12/21/16 1600  cefTRIAXone (ROCEPHIN) 2 g in dextrose 5 % 50 mL IVPB  Status:  Discontinued     2 g 100 mL/hr over 30 Minutes Intravenous Every 24 hours 12/21/16 1131 12/21/16 1558   12/21/16 1400  vancomycin (VANCOCIN) IVPB 750 mg/150 ml premix  Status:  Discontinued     750 mg 150 mL/hr over 60 Minutes Intravenous Every 8 hours 12/21/16 0142 12/22/16 0942   12/21/16 1000  sulfamethoxazole-trimethoprim (BACTRIM DS,SEPTRA DS) 800-160 MG per tablet 1 tablet  Status:  Discontinued     1 tablet Oral Daily 12/21/16 0058 12/21/16 0100   12/21/16 1000  abacavir-dolutegravir-lamiVUDine (TRIUMEQ) 600-50-300 MG per tablet 1 tablet  Status:  Discontinued     1 tablet Oral Daily 12/21/16 0204 12/22/16 0903   12/21/16 1000  sulfamethoxazole-trimethoprim (BACTRIM DS,SEPTRA DS) 800-160 MG per tablet 1 tablet  Status:  Discontinued     1 tablet Oral Daily 12/21/16 0204 12/23/16 1822   12/21/16 0800  ceFEPIme (MAXIPIME) 2 g in dextrose 5 % 50 mL IVPB  Status:  Discontinued     2 g 100 mL/hr over 30 Minutes Intravenous Every 8 hours 12/21/16 0137 12/21/16 1131   12/21/16 0600  piperacillin-tazobactam (ZOSYN) IVPB 3.375 g  Status:  Discontinued     3.375 g 12.5 mL/hr over 240 Minutes Intravenous Every 8 hours 12/21/16 0058 12/21/16 0132   12/21/16 0200  metroNIDAZOLE (FLAGYL) IVPB 500 mg  Status:  Discontinued     500 mg 100 mL/hr over 60 Minutes Intravenous Every 8 hours 12/21/16 0137 12/22/16 1446   12/21/16 0145  ceFEPIme (MAXIPIME) 2 g in dextrose 5 % 50 mL IVPB     2 g 100 mL/hr over 30 Minutes Intravenous  Once 12/21/16 0137 12/21/16 0358   12/21/16 0145  vancomycin (VANCOCIN) IVPB 1000 mg/200 mL premix      1,000 mg 200 mL/hr over 60 Minutes Intravenous  Once 12/21/16 0142 12/21/16 0429   12/21/16 0130  piperacillin-tazobactam (ZOSYN)  IVPB 3.375 g  Status:  Discontinued     3.375 g 100 mL/hr over 30 Minutes Intravenous  Once 12/21/16 0115 12/21/16 0132   12/21/16 0130  azithromycin (ZITHROMAX) powder 1 g     1 g Oral  Once 12/21/16 0125 12/21/16 0328      Subjective:   [name] seen and examined today.  Patient states he's feeling better today. Mother at bedside states he was able to eat some this morning. Patient denies any current pain, chest pain, shortness of breath, abdominal pain, dizziness or headache.   Objective:   Vitals:   01/24/17 2108 01/25/17 1514 01/25/17 2133 01/26/17 0634  BP: 108/69 (!) 104/58 100/66 107/67  Pulse: 85 80 82 75  Resp: 18 16 18 18   Temp: 98 F (36.7 C) 98.7 F (37.1 C) 97.8 F (36.6 C) 98.2 F (36.8 C)  TempSrc: Oral   Oral  SpO2: 99% 99% 100% 100%  Weight:      Height:        Intake/Output Summary (Last 24 hours) at 01/26/17 1229 Last data filed at 01/26/17 1000  Gross per 24 hour  Intake           939.17 ml  Output             1800 ml  Net          -860.83 ml   Filed Weights   01/10/17 0900 01/11/17 0640 01/11/17 0905  Weight: 63 kg (138 lb 14.2 oz) 60 kg (132 lb 4.4 oz) 62 kg (136 lb 11 oz)   Exam  General: Well developed, no apparent distress  HEENT: NCAT, mucous membranes moist.   Cardiovascular: Normal S1, S2, RRR, no murmurs  Respiratory: Clear to auscultation bilaterally with equal chest rise  Abdomen: Soft, nontender, nondistended, + bowel sounds  Extremities: warm dry without cyanosis clubbing or edema  Neuro: AAOx3, nonfocal  Skin: Without rashes exudates or nodules, multiple tattoos  Psych: appropriate mood and affect, pleasant  Data Reviewed: I have personally reviewed following labs and imaging studies  CBC:  Recent Labs Lab 01/25/17 0809 01/26/17 0613  WBC 2.2* 2.2*  HGB 8.2* 8.0*  HCT  26.2* 25.4*  MCV 88.8 88.8  PLT 176 846   Basic Metabolic Panel:  Recent Labs Lab 01/22/17 0538 01/23/17 0638 01/24/17 0950 01/25/17 0809 01/26/17 0613  NA 134* 133* 134* 132* 132*  K 4.2 3.9 3.8 4.3 4.6  CL 105 103 104 102 102  CO2 24 26 25 24 25   GLUCOSE 83 85 142* 94 82  BUN 13 13 15 13 13   CREATININE 0.95 0.91 0.93 0.97 0.97  CALCIUM 8.7* 8.6* 8.6* 9.0 8.9  MG 1.2* 1.4* 1.4* 1.5* 1.8  PHOS 4.7* 4.5 3.8 4.0 5.3*   GFR: Estimated Creatinine Clearance: 96.8 mL/min (by C-G formula based on SCr of 0.97 mg/dL). Liver Function Tests:  Recent Labs Lab 01/20/17 1230  01/22/17 0538 01/23/17 6599 01/24/17 0950 01/25/17 0809 01/26/17 0613  AST 45*  --   --   --   --   --   --   ALT 41  --   --   --   --   --   --   ALKPHOS 63  --   --   --   --   --   --   BILITOT 0.5  --   --   --   --   --   --   PROT 8.5*  --   --   --   --   --   --  ALBUMIN 2.1*  < > 2.1* 2.2* 2.3* 2.4* 2.5*  < > = values in this interval not displayed. No results for input(s): LIPASE, AMYLASE in the last 168 hours. No results for input(s): AMMONIA in the last 168 hours. Coagulation Profile: No results for input(s): INR, PROTIME in the last 168 hours. Cardiac Enzymes: No results for input(s): CKTOTAL, CKMB, CKMBINDEX, TROPONINI in the last 168 hours. BNP (last 3 results) No results for input(s): PROBNP in the last 8760 hours. HbA1C: No results for input(s): HGBA1C in the last 72 hours. CBG: No results for input(s): GLUCAP in the last 168 hours. Lipid Profile: No results for input(s): CHOL, HDL, LDLCALC, TRIG, CHOLHDL, LDLDIRECT in the last 72 hours. Thyroid Function Tests: No results for input(s): TSH, T4TOTAL, FREET4, T3FREE, THYROIDAB in the last 72 hours. Anemia Panel: No results for input(s): VITAMINB12, FOLATE, FERRITIN, TIBC, IRON, RETICCTPCT in the last 72 hours. Urine analysis:    Component Value Date/Time   COLORURINE YELLOW 12/20/2016 0706   APPEARANCEUR CLEAR 12/20/2016 0706    LABSPEC 1.026 12/20/2016 0706   PHURINE 6.0 12/20/2016 0706   GLUCOSEU NEGATIVE 12/20/2016 0706   HGBUR NEGATIVE 12/20/2016 0706   BILIRUBINUR NEGATIVE 12/20/2016 0706   KETONESUR NEGATIVE 12/20/2016 0706   PROTEINUR 100 (A) 12/20/2016 0706   NITRITE NEGATIVE 12/20/2016 0706   LEUKOCYTESUR NEGATIVE 12/20/2016 0706   Sepsis Labs: @LABRCNTIP (procalcitonin:4,lacticidven:4)  ) Recent Results (from the past 240 hour(s))  CSF culture     Status: None   Collection Time: 01/20/17 11:38 AM  Result Value Ref Range Status   Specimen Description CSF  Final   Special Requests TUBE 2  Final   Gram Stain   Final    CYTOSPIN SMEAR WBC PRESENT, PREDOMINANTLY MONONUCLEAR ENCAPSULATED YEAST SEEN CRITICAL RESULT CALLED TO, READ BACK BY AND VERIFIED WITH: Ardelia Mems RN 13:40 01/20/17 ([email or web address])    Culture NO GROWTH 3 DAYS  Final   Report Status 01/23/2017 FINAL  Final  Fungus Culture With Stain     Status: None (Preliminary result)   Collection Time: 01/20/17 11:38 AM  Result Value Ref Range Status   Fungus Stain Final report  Final    Comment: (NOTE) Performed At: [facility] [place], [place] [id number] Lindon Romp MD BM:[id number]    Fungus (Mycology) Culture PENDING  Incomplete   Fungal Source CSF  Final    Comment: TUBE 2  Fungus Culture Result     Status: None   Collection Time: 01/20/17 11:38 AM  Result Value Ref Range Status   Result 1 Comment  Final    Comment: (NOTE) KOH/Calcofluor preparation:  no fungus observed. Performed At: [facility] [place], [place] [id number] Lindon Romp MD IH:[id number]   CSF culture     Status: None (Preliminary result)   Collection Time: 01/25/17  1:33 PM  Result Value Ref Range Status   Specimen Description CSF  Final   Special Requests NONE  Final   Gram Stain   Final    WBC PRESENT, PREDOMINANTLY MONONUCLEAR YEAST CYTOSPIN SMEAR    Culture NO GROWTH < 24 HOURS  Final   Report  Status PENDING  Incomplete      Radiology Studies: Dg Fluoro Guide Lumbar Puncture  Result Date: 01/25/2017 Etheleen Mayhew, MD     01/25/2017  1:46 PM Lumbar puncture performed at L2-L3.  Opening pressure 22 cm H20.  22 mL clear CSF obtained and sent to lab for testing.  Please see  full dictation in PACS for additional details.     Scheduled Meds: . amantadine  100 mg Oral BID WC  . azithromycin  1,200 mg Oral Weekly  . feeding supplement  1 Container Oral TID BM  . feeding supplement (ENSURE ENLIVE)  237 mL Oral TID BM  . fluconazole  400 mg Oral Daily  . lidocaine (PF)  5 mL Intradermal Once  . magnesium oxide  400 mg Oral BID  . mouth rinse  15 mL Mouth Rinse BID  . multivitamin with minerals  1 tablet Oral Daily  . senna-docusate  2 tablet Oral BID  . sodium chloride flush  3 mL Intravenous Q12H  . sulfamethoxazole-trimethoprim  1 tablet Oral Daily  . tamsulosin  0.4 mg Oral Daily   Continuous Infusions: . sodium chloride 50 mL/hr at 01/24/17 1754     LOS: 36 days   Time Spent in minutes   30  minutes  Clarisse Rodriges D.O. on 01/26/2017 at 12:29 PM  Between 7am to 7pm - Pager - [phone]  After 7pm go to [email or web address] - password [email or web address]  And look for the night coverage person covering for me after hours  [facility] Office  [phone]

## 2017-01-27 DIAGNOSIS — R509 Fever, unspecified: Secondary | ICD-10-CM

## 2017-01-27 LAB — RENAL FUNCTION PANEL
ALBUMIN: 2.5 g/dL — AB (ref 3.5–5.0)
Anion gap: 6 (ref 5–15)
BUN: 15 mg/dL (ref 6–20)
CALCIUM: 9 mg/dL (ref 8.9–10.3)
CO2: 25 mmol/L (ref 22–32)
CREATININE: 0.99 mg/dL (ref 0.61–1.24)
Chloride: 100 mmol/L — ABNORMAL LOW (ref 101–111)
GFR calc Af Amer: >60 mL/min (ref >60)
GLUCOSE: 84 mg/dL (ref 65–99)
PHOSPHORUS: 5.1 mg/dL — AB (ref 2.5–4.6)
Potassium: 4.9 mmol/L (ref 3.5–5.1)
SODIUM: 131 mmol/L — AB (ref 135–145)

## 2017-01-27 LAB — MAGNESIUM: MAGNESIUM: 1.5 mg/dL — AB (ref 1.7–2.4)

## 2017-01-27 MED ORDER — ENSURE ENLIVE PO LIQD
237.0000 mL | Freq: Two times a day (BID) | ORAL | Status: DC
  Administered 2017-01-28 – 2017-02-06 (×18): 237 mL via ORAL

## 2017-01-27 NOTE — Progress Notes (Signed)
[facility] for Infectious Disease    Date of Admission:  12/20/2016   Total days of antibiotics 39        Day 9  Fluconazole (39 d of CM tx)        Day 39 oi proph   ID: William Pena is a 31 y.o. male with advanced hiv disease, disseminated CM c/b multiple CNS infarct  Principal Problem:   Perirectal abscess Active Problems:   Chronic pain   AIDS (acquired immune deficiency syndrome) (HCC)   Hyponatremia   Normocytic anemia   Elevated LFTs   Pulmonary infiltrates   Hypomagnesemia   Encephalopathy acute   SIRS (systemic inflammatory response syndrome) (HCC)   Acute respiratory failure (HCC)   Rectal abscess   Cryptococcal meningoencephalitis (HCC)   Cryptococcosis (HCC)   Cavitary pneumonia   Diffuse lymphadenopathy   Drug rash   Elevated intracranial pressure   HIV disease (HCC)   Lymphadenopathy of head and neck   HIV (human immunodeficiency virus infection) ([place])   Meningitis   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 amantadine  100 mg Oral BID WC  . azithromycin  1,200 mg Oral Weekly  . [START ON 01/28/2017] feeding supplement (ENSURE ENLIVE)  237 mL Oral BID BM  . fluconazole  400 mg Oral Daily  . lidocaine (PF)  5 mL Intradermal Once  . magnesium oxide  400 mg Oral BID  . mouth rinse  15 mL Mouth Rinse BID  . multivitamin with minerals  1 tablet Oral Daily  . senna-docusate  2 tablet Oral BID  . sodium chloride flush  3 mL Intravenous Q12H  . sulfamethoxazole-trimethoprim  1 tablet Oral Daily  . tamsulosin  0.4 mg Oral Daily  . valACYclovir  1,000 mg Oral BID    Objective: Vital signs in last 24 hours: Temp:  [98.9 F (37.2 C)] 98.9 F (37.2 C) (06/29 1607) Pulse Rate:  [92-95] 95 (06/29 1607) Resp:   [18] 18 (06/29 1607) BP: (92-102)/(58-63) 92/58 (06/29 1607) SpO2:  [100 %] 100 % (06/29 1607) Physical Exam  Constitutional: [name] awake. Only oriented to self. He appears well-developed and well-nourished. No distress.  HENT: no conjunctivities Mouth/Throat: Oropharynx is clear and moist. No oropharyngeal exudate.  Abdominal: Soft. Bowel sounds are normal. He exhibits no distension. There is no tenderness.  gu = multiple pinpoint raised lesion on shaft of penis no ulceration Neuro = slow to follow commands but follows roughly 1/2 of the time. He has natural contraction of right arm but is able to extend with some mild contraction. LE bilateral proximal weakness Skin: Skin is warm and dry. No rash noted. No erythema.   Lab Results  Recent Labs  01/25/17 0809 01/26/17 0613 01/27/17 0527  WBC 2.2* 2.2*  --   HGB 8.2* 8.0*  --   HCT 26.2* 25.4*  --   NA 132* 132* 131*  K 4.3 4.6 4.9  CL 102 102 100*  CO2 24 25 25   BUN 13 13 15   CREATININE 0.97 0.97 0.99   Liver Panel  Recent Labs  01/26/17 9622 01/27/17 0527  ALBUMIN 2.5* 2.5*    Microbiology: 6/7 csf no growth, but yeast on gram stain Studies/Results: No results found.  Assessment/Plan: Disseminated cryptococcal meningitis with vascular infarct = started on consolidation phase with fluconazole 400mg  daily by mouth Will check hepatic panel weekly. Fungal cx on 6/1 shows +fungal elements on gram stain but cx stil negative  High opening pressure on LP = repeat OP is slightly elevated at 22 on 6/27 roughly 5 days from prior LP which was 30. For now, we will defer getting VP shunt since patient is asymptomatic and alert, not complaining of headache. Mentation though is less alert.   If he is not more alert, would recommend repeat LP +/- repeat imaging   HSV penile lesions = will treat with valtrex 1gm BID x 7days then place on chronic suppression. Currently day 2 of 7. Looks improved from admit  Medication monitoring =  kidney function is back to normal.  oi proph = continue on bactrim daily plus weekly azithromycin  hiv disease= holding off initiation of ART due to high risk for IRIS, plan to initiate next 1-2 weeks  Malnutrition = encourse to drink 3 ensure enlive TID  Decondition and neuro insult from CM = continue with PT and OT, treat as I would advocate to treat as one would for a patient that needs stroke rehab   [facility], [facility] for Infectious Diseases Cell: [phone] Pager: [phone]  01/27/2017, 7:27 PM

## 2017-01-27 NOTE — Progress Notes (Signed)
PROGRESS NOTE    William Pena  IWO:[id number] DOB: 10/13/1985 DOA: 12/20/2016 PCP: [name], MD   Chief Complaint  Patient presents with  . Rectal Bleeding  . Generalized Body Aches    Brief Narrative:  HPI on 12/20/2016 by Dr. Stark Klein (surgery) Pt is a 31 yo M with history of recurrent perirectal abscesses.  He was treated at [facility] with surgery in January by Dr. Ronita Hipps, but now is incarcerated in [place].  He has had multiple different types of drains, but currently just has a draining seton in place at the site of a fistula.  He has been to the ED 3 times in the last week.  Due to his current incarceration in [facility], he cannot be transferred to [place].  He reports severe rectal pain.  He has had fever to 103 and had sepsis workup this AM at [facility].  He did have a CT on 5/19 that showed small abscess and antibiotics were recommended.  He was not able to be compliant with that for unclear reasons.  He has also had abdominal pain. [name] also seen at [facility] for his AIDS by Dr. Tobie Poet.  [name] frequently homeless as well.  [name] reportedly on his HIV meds.  Last CD 4 count looks like 1 /microliter (10/04/2016) and last viral load looks like 50,936/mL (08/08/2016).  Interim history During admission he was also noted to have headache, fever, chills, night sweats, neck pain and lymphadenopathy. ID was consulted and recommended imaging of chest, LP and cultures be sent from perirectal I&D. Also his serum cryptococcal antigen titer was elevated raising the possibility of disseminated cryptococcal infection including lung involvement. Pulmonology was consulted by ID for bronchoscopy and washings in an effort to help determine if his CT changes reflect TB or possible pulmonary cryptococcal disease. Initially unable to perform bronchoscopy due to hypoxia. 5/28 patient had altered LOC, became very lethargic/obtunded requiring CT and LP, complicated by acute respiratory failure due to  altered mental status that required mechanical ventilation. He had a lumbar CSF drain to depression arise. Now extubated, drain removed. Failed swallowing evaluation and has Cor trak forte tube feeds. Mental status slightly better but waxing and waning. ID, neurology, neurosurgery and CCM consulted during hospital course. Care transferred to stepdown and [place] on 01/09/17. MS slowly improving. Cor Trak discontinued 6/12 and diet initiated. Transferred to medical bed 6/13.Acute kidney injury on 6/14 (creatinine 2.8), likely due to acute urinary retention & Amphotericin> resolved. 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 Lumbar puncture done on 01/20/2017 showed opening pressure of 31, better than before, it was 55 -CSF culture showed yeast -Discussed with infectious disease, Dr. Baxter Flattery. Patient will need repeat lumbar puncture as he did have an elevated opening pressure on previous lumbar puncture. -Interventional radiology consult appreciated, status post lumbar puncture on 01/25/2017. Opening pressure 22. CSF obtained and sent to the lab, results pending. -Discussed with Dr. Baxter Flattery, since patient is currently asymptomatic and alert, will defer VP shunt for now  Acute urinary retention -Continue Foley catheter and Flomax -Patient failed voiding trial twice  Acute kidney injury -Likely secondary to urinary retention and possibly amphotericin -Creatinine peaked to 2.84,  currently 0.97 -Continue to monitor BMP  Acute hypoxic respiratory failure secondary cryptococcal pneumonia -Required intubation, was extubated on  01/03/2017 -Patient currently on room air  Acute encephalopathy -Likely multi-factorial including cryptococcal meningitis, acute strokes, metabolic arrangements, medication versus respiratory etiology -Has been improving slowly. Not sure what patient's baseline is.  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 -Neurology consulted and appreciated -No role for statins or antiplatelets in the setting continue antifungal treatment  HIV/AIDS -Infectious disease following -CD4 count on admission 21, high viral load greater than 1 million -Continue prophylaxis with Bactrim and azithromycin -ART not initiated due to high risk of IR IS, OD initiated in 1-2 weeks per ID  Perirectal abscess with pain -Patient initially admitted by general surgery, status post IND -Continue dressing changes twice a day -Continue wound care -Continue pain control with both IV and oral formulations  Genital herpes -Continue valtrex 1g BID (started on 6/28) for 7 days then needs chronic suppression   Hypomagnesemia -Magnesium 1.8 today -We'll continue to monitor and replace as needed  Diarrhea -Stool culture negative, resolved  Anemia of chronic disease -Stable, hemoglobin 8.0 -Continue to monitor CBC  Hypertension -Continue IV hydralazine as needed  Thrombocytopenia -Unknown etiology, possibly medication effect -Currently stable, no reports of bleeding -Platelets 190 -Continue to monitor CBC  Facial swelling -Appears to have resolved, likely related to hypoalbuminemia  Constipation -Continue bowel regimen  Physical deconditioning -Compensated by patient's comorbidities as well as prolonged hospitalization.  -Physical therapy assessed the patient, recommending SNF -Speech therapy assessed patient, recommended regular diet, thin liquids  DVT Prophylaxis  SCDs  Code Status: Full  Family Communication: Mother at bedside  Disposition Plan:  Admitted. Dispo to SNF when stable.   Consultants PCCM Infectious disease Interventional radiology General surgery Neurology Neurosurgery  Procedures  Lumbar puncture Lumbar drain placement on 5/28, discontinued 6/10 Intubation/extubation Cor Trak discontinued 01/10/2017 Lumbar puncture by interventional radiology on 01/25/2017  Antibiotics   Anti-infectives    Start     Dose/Rate Route Frequency Ordered Stop   01/26/17 1545  valACYclovir (VALTREX) tablet 1,000 mg     1,000 mg Oral 2 times daily 01/26/17 1535 01/29/17 2159   01/24/17 1100  fluconazole (DIFLUCAN) tablet 400 mg     400 mg Oral Daily 01/24/17 1059     01/19/17 1530  fluconazole (DIFLUCAN) IVPB 400 mg  Status:  Discontinued     400 mg 100 mL/hr over 120 Minutes Intravenous Every 24 hours 01/19/17 1442 01/24/17 1059   01/16/17 2200  sulfamethoxazole-trimethoprim (BACTRIM DS,SEPTRA DS) 800-160 MG per tablet 1 tablet     1 tablet Oral Daily 01/16/17 2035     01/13/17 1000  sulfamethoxazole-trimethoprim (BACTRIM,SEPTRA) 200-40 MG/5ML suspension 10 mL  Status:  Discontinued     10 mL Oral Daily 01/12/17 0952 01/12/17 1149   01/12/17 1300  amphotericin B liposome (AMBISOME) 240 mg in dextrose 5 % 500 mL IVPB  Status:  Discontinued     240 mg 250 mL/hr over 120 Minutes Intravenous Every 24 hours 01/12/17 1155 01/12/17 1203   01/12/17 1300  amphotericin B liposome (AMBISOME) 240 mg in dextrose 5 % 500 mL IVPB  Status:  Discontinued     240 mg 250 mL/hr over 120 Minutes Intravenous Every 24 hours 01/12/17 1203 01/19/17 1442   01/12/17 1200  flucytosine (ANCOBON) capsule 1,500 mg  Status:  Discontinued     1,500 mg Oral Every 6 hours 01/12/17 0952 01/19/17 1442   01/06/17 1000  azithromycin [facility]) tablet 1,200 mg     1,200 mg Oral Weekly 01/04/17 2339     01/06/17 1000  amphotericin B liposome (AMBISOME) 240 mg in dextrose 5 % 500 mL IVPB  Status:  Discontinued     4 mg/kg  59.8 kg 250 mL/hr over 120 Minutes  Intravenous Every 24 hours 01/06/17 0815 01/12/17 1155   12/31/16 1000  sulfamethoxazole-trimethoprim (BACTRIM,SEPTRA) 200-40 MG/5ML suspension 10 mL  Status:  Discontinued     10 mL Per Tube Daily 12/30/16 1418 01/12/17 0952   12/31/16 1000  amphotericin B liposome (AMBISOME) 270 mg in dextrose 5 % 500 mL IVPB  Status:  Discontinued     4 mg/kg  67.6 kg 250 mL/hr over 120 Minutes Intravenous Every 24 hours 12/30/16 1510 01/06/17 0815   12/30/16 1000  azithromycin (ZITHROMAX) 200 MG/5ML suspension 1,200 mg  Status:  Discontinued     1,200 mg Per Tube Every Fri 12/26/16 2030 01/04/17 2339   12/29/16 1000  sulfamethoxazole-trimethoprim (BACTRIM,SEPTRA) 200-40 MG/5ML suspension 20 mL  Status:  Discontinued     20 mL Per Tube Daily 12/28/16 1226 12/30/16 1418   12/28/16 1000  amphotericin B liposome (AMBISOME) 240 mg in dextrose 5 % 500 mL IVPB  Status:  Discontinued     3.5 mg/kg  67.6 kg 250 mL/hr over 120 Minutes Intravenous Every 24 hours 12/28/16 0853 12/30/16 1510   12/28/16 0900  amphotericin B liposome (AMBISOME) 240 mg in dextrose 5 % 500 mL IVPB  Status:  Discontinued     3.5 mg/kg  67.6 kg 250 mL/hr over 120 Minutes Intravenous Every 24 hours 12/28/16 0832 12/28/16 0853   12/27/16 0000  flucytosine (ANCOBON) capsule 1,500 mg  Status:  Discontinued     1,500 mg Per Tube Every 6 hours 12/26/16 1909 01/12/17 0952   12/26/16 2200  sulfamethoxazole-trimethoprim (BACTRIM DS,SEPTRA DS) 800-160 MG per tablet 1 tablet  Status:  Discontinued     1 tablet Per Tube Every 12 hours 12/26/16 1909 12/26/16 1912   12/26/16 2200  valACYclovir (VALTREX) tablet 1,000 mg     1,000 mg Per Tube 2 times daily 12/26/16 1909 01/01/17 2359   12/26/16 2200  sulfamethoxazole-trimethoprim (BACTRIM,SEPTRA) 200-40 MG/5ML suspension 20 mL  Status:  Discontinued     20 mL Per Tube Every 12 hours 12/26/16 1912 12/28/16 1226   12/23/16 2200  sulfamethoxazole-trimethoprim (BACTRIM DS,SEPTRA DS) 800-160 MG per tablet 1  tablet  Status:  Discontinued     1 tablet Oral Every 12 hours 12/23/16 1822 12/26/16 1909   12/23/16 1900  cefTRIAXone (ROCEPHIN) 2 g in dextrose 5 % 50 mL IVPB  Status:  Discontinued     2 g 100 mL/hr over 30 Minutes Intravenous Every 24 hours 12/23/16 1821 12/28/16 1122   12/23/16 0900  amphotericin B liposome (AMBISOME) 200 mg in dextrose 5 % 500 mL IVPB  Status:  Discontinued     200 mg 250 mL/hr over 120 Minutes Intravenous Every 24 hours 12/22/16 0919 12/28/16 0832   12/23/16 0800  azithromycin (ZITHROMAX) tablet 1,200 mg  Status:  Discontinued     1,200 mg Oral Weekly 12/22/16 0811 12/26/16 2029   12/22/16 2200  valACYclovir (VALTREX) tablet 1,000 mg  Status:  Discontinued     1,000 mg Oral 2 times daily 12/22/16 1611 12/26/16 1909   12/22/16 1800  amoxicillin-clavulanate (AUGMENTIN) 875-125 MG per tablet 1 tablet  Status:  Discontinued     1 tablet Oral 2 times daily with meals 12/22/16 1446  12/23/16 1821   12/22/16 1800  doxycycline (VIBRA-TABS) tablet 100 mg  Status:  Discontinued     100 mg Oral 2 times daily with meals 12/22/16 1446 12/23/16 1821   12/22/16 1200  flucytosine (ANCOBON) capsule 1,500 mg  Status:  Discontinued     1,500 mg Oral Every 6 hours 12/22/16 0614 12/26/16 1909   12/22/16 1000  azithromycin (ZITHROMAX) tablet 250 mg  Status:  Discontinued     250 mg Oral Daily 12/21/16 0224 12/21/16 1131   12/22/16 1000  doxycycline (VIBRA-TABS) tablet 100 mg  Status:  Discontinued     100 mg Oral Every 12 hours 12/22/16 0942 12/22/16 1446   12/22/16 0000  amphotericin B liposome (AMBISOME) 190 mg in dextrose 5 % 500 mL IVPB  Status:  Discontinued     3 mg/kg  64.2 kg 250 mL/hr over 120 Minutes Intravenous Every 24 hours 12/21/16 2313 12/22/16 0919   12/22/16 0000  flucytosine (ANCOBON) capsule 250 mg  Status:  Discontinued     250 mg Oral Every 6 hours 12/21/16 2314 12/21/16 2325   12/22/16 0000  flucytosine (ANCOBON) capsule 1,500 mg  Status:  Discontinued     1,500  mg Oral Every 6 hours 12/21/16 2326 12/22/16 0614   12/21/16 1645  cefTRIAXone (ROCEPHIN) 2 g in dextrose 5 % 50 mL IVPB  Status:  Discontinued     2 g 100 mL/hr over 30 Minutes Intravenous Every 24 hours 12/21/16 1539 12/22/16 1446   12/21/16 1645  metroNIDAZOLE (FLAGYL) IVPB 500 mg  Status:  Discontinued     500 mg 100 mL/hr over 60 Minutes Intravenous Every 8 hours 12/21/16 1539 12/21/16 2348   12/21/16 1600  cefTRIAXone (ROCEPHIN) 2 g in dextrose 5 % 50 mL IVPB  Status:  Discontinued     2 g 100 mL/hr over 30 Minutes Intravenous Every 24 hours 12/21/16 1131 12/21/16 1558   12/21/16 1400  vancomycin (VANCOCIN) IVPB 750 mg/150 ml premix  Status:  Discontinued     750 mg 150 mL/hr over 60 Minutes Intravenous Every 8 hours 12/21/16 0142 12/22/16 0942   12/21/16 1000  sulfamethoxazole-trimethoprim (BACTRIM DS,SEPTRA DS) 800-160 MG per tablet 1 tablet  Status:  Discontinued     1 tablet Oral Daily 12/21/16 0058 12/21/16 0100   12/21/16 1000  abacavir-dolutegravir-lamiVUDine (TRIUMEQ) 600-50-300 MG per tablet 1 tablet  Status:  Discontinued     1 tablet Oral Daily 12/21/16 0204 12/22/16 0903   12/21/16 1000  sulfamethoxazole-trimethoprim (BACTRIM DS,SEPTRA DS) 800-160 MG per tablet 1 tablet  Status:  Discontinued     1 tablet Oral Daily 12/21/16 0204 12/23/16 1822   12/21/16 0800  ceFEPIme (MAXIPIME) 2 g in dextrose 5 % 50 mL IVPB  Status:  Discontinued     2 g 100 mL/hr over 30 Minutes Intravenous Every 8 hours 12/21/16 0137 12/21/16 1131   12/21/16 0600  piperacillin-tazobactam (ZOSYN) IVPB 3.375 g  Status:  Discontinued     3.375 g 12.5 mL/hr over 240 Minutes Intravenous Every 8 hours 12/21/16 0058 12/21/16 0132   12/21/16 0200  metroNIDAZOLE (FLAGYL) IVPB 500 mg  Status:  Discontinued     500 mg 100 mL/hr over 60 Minutes Intravenous Every 8 hours 12/21/16 0137 12/22/16 1446   12/21/16 0145  ceFEPIme (MAXIPIME) 2 g in dextrose 5 % 50 mL IVPB     2 g 100 mL/hr over 30 Minutes Intravenous   Once 12/21/16 0137 12/21/16 0358   12/21/16 0145  vancomycin (VANCOCIN) IVPB  1000 mg/200 mL premix     1,000 mg 200 mL/hr over 60 Minutes Intravenous  Once 12/21/16 0142 12/21/16 0429   12/21/16 0130  piperacillin-tazobactam (ZOSYN) IVPB 3.375 g  Status:  Discontinued     3.375 g 100 mL/hr over 30 Minutes Intravenous  Once 12/21/16 0115 12/21/16 0132   12/21/16 0130  azithromycin (ZITHROMAX) powder 1 g     1 g Oral  Once 12/21/16 0125 12/21/16 0328      Subjective:   [name] seen and examined today.  Patient feeling better this morning. Mother at bedside states he seems to be improving. Denies headache or pain this morning. Denies any chest pain, shortness of breath, abdominal pain.   Objective:   Vitals:   01/25/17 2133 01/26/17 0634 01/26/17 1324 01/26/17 2148  BP: 100/66 107/67 107/72 102/63  Pulse: 82 75 76 92  Resp: 18 18 16 18   Temp: 97.8 F (36.6 C) 98.2 F (36.8 C) 98.6 F (37 C)   TempSrc:  Oral    SpO2: 100% 100% 100% 100%  Weight:      Height:        Intake/Output Summary (Last 24 hours) at 01/27/17 1403 Last data filed at 01/27/17 1120  Gross per 24 hour  Intake                0 ml  Output              351 ml  Net             -351 ml   Filed Weights   01/10/17 0900 01/11/17 0640 01/11/17 0905  Weight: 63 kg (138 lb 14.2 oz) 60 kg (132 lb 4.4 oz) 62 kg (136 lb 11 oz)   Exam  General: Well developed, thin, no distress  HEENT: NCAT, mucous membranes moist.   Cardiovascular: S1 S2 auscultated, no rubs, murmurs or gallops. Regular rate and rhythm.  Respiratory: Clear to auscultation bilaterally with equal chest rise  Abdomen: Soft, nontender, nondistended, + bowel sounds  Extremities: warm dry without cyanosis clubbing or edema  Neuro: AAOx3,nonfocal, quiet  Skin: Without rashes exudates or nodules, +tattoos  Psych: Appropriate  Data Reviewed: I have personally reviewed following labs and imaging studies  CBC:  Recent Labs Lab  01/25/17 0809 01/26/17 0613  WBC 2.2* 2.2*  HGB 8.2* 8.0*  HCT 26.2* 25.4*  MCV 88.8 88.8  PLT 176 194   Basic Metabolic Panel:  Recent Labs Lab 01/23/17 0638 01/24/17 0950 01/25/17 0809 01/26/17 0613 01/27/17 0527  NA 133* 134* 132* 132* 131*  K 3.9 3.8 4.3 4.6 4.9  CL 103 104 102 102 100*  CO2 26 25 24 25 25   GLUCOSE 85 142* 94 82 84  BUN 13 15 13 13 15   CREATININE 0.91 0.93 0.97 0.97 0.99  CALCIUM 8.6* 8.6* 9.0 8.9 9.0  MG 1.4* 1.4* 1.5* 1.8 1.5*  PHOS 4.5 3.8 4.0 5.3* 5.1*   GFR: Estimated Creatinine Clearance: 94.8 mL/min (by C-G formula based on SCr of 0.99 mg/dL). Liver Function Tests:  Recent Labs Lab 01/23/17 1740 01/24/17 0950 01/25/17 0809 01/26/17 0613 01/27/17 0527  ALBUMIN 2.2* 2.3* 2.4* 2.5* 2.5*   No results for input(s): LIPASE, AMYLASE in the last 168 hours. No results for input(s): AMMONIA in the last 168 hours. Coagulation Profile: No results for input(s): INR, PROTIME in the last 168 hours. Cardiac Enzymes: No results for input(s): CKTOTAL, CKMB, CKMBINDEX, TROPONINI in the last 168 hours. BNP (last 3  results) No results for input(s): PROBNP in the last 8760 hours. HbA1C: No results for input(s): HGBA1C in the last 72 hours. CBG: No results for input(s): GLUCAP in the last 168 hours. Lipid Profile: No results for input(s): CHOL, HDL, LDLCALC, TRIG, CHOLHDL, LDLDIRECT in the last 72 hours. Thyroid Function Tests: No results for input(s): TSH, T4TOTAL, FREET4, T3FREE, THYROIDAB in the last 72 hours. Anemia Panel: No results for input(s): VITAMINB12, FOLATE, FERRITIN, TIBC, IRON, RETICCTPCT in the last 72 hours. Urine analysis:    Component Value Date/Time   COLORURINE YELLOW 12/20/2016 0706   APPEARANCEUR CLEAR 12/20/2016 0706   LABSPEC 1.026 12/20/2016 0706   PHURINE 6.0 12/20/2016 0706   GLUCOSEU NEGATIVE 12/20/2016 0706   HGBUR NEGATIVE 12/20/2016 0706   BILIRUBINUR NEGATIVE 12/20/2016 0706   KETONESUR NEGATIVE 12/20/2016  0706   PROTEINUR 100 (A) 12/20/2016 0706   NITRITE NEGATIVE 12/20/2016 0706   LEUKOCYTESUR NEGATIVE 12/20/2016 0706   Sepsis Labs: @LABRCNTIP (procalcitonin:4,lacticidven:4)  ) Recent Results (from the past 240 hour(s))  CSF culture     Status: None   Collection Time: 01/20/17 11:38 AM  Result Value Ref Range Status   Specimen Description CSF  Final   Special Requests TUBE 2  Final   Gram Stain   Final    CYTOSPIN SMEAR WBC PRESENT, PREDOMINANTLY MONONUCLEAR ENCAPSULATED YEAST SEEN CRITICAL RESULT CALLED TO, READ BACK BY AND VERIFIED WITH: Ardelia Mems RN 13:40 01/20/17 ([email or web address])    Culture NO GROWTH 3 DAYS  Final   Report Status 01/23/2017 FINAL  Final  Fungus Culture With Stain     Status: None (Preliminary result)   Collection Time: 01/20/17 11:38 AM  Result Value Ref Range Status   Fungus Stain Final report  Final    Comment: (NOTE) Performed At: [facility] [place], [place] [id number] Lindon Romp MD [id number]    Fungus (Mycology) Culture PENDING  Incomplete   Fungal Source CSF  Final    Comment: TUBE 2  Fungus Culture Result     Status: None   Collection Time: 01/20/17 11:38 AM  Result Value Ref Range Status   Result 1 Comment  Final    Comment: (NOTE) KOH/Calcofluor preparation:  no fungus observed. Performed At: [facility] [place], [place] [id number] Lindon Romp MD KW:[id number]   CSF culture     Status: None (Preliminary result)   Collection Time: 01/25/17  1:33 PM  Result Value Ref Range Status   Specimen Description CSF  Final   Special Requests NONE  Final   Gram Stain   Final    WBC PRESENT, PREDOMINANTLY MONONUCLEAR YEAST CYTOSPIN SMEAR    Culture NO GROWTH 2 DAYS  Final   Report Status PENDING  Incomplete  Fungus Culture With Stain     Status: None (Preliminary result)   Collection Time: 01/25/17  1:33 PM  Result Value Ref Range Status   Fungus Stain Final report  Final    Comment:  (NOTE) Performed At: [facility] [place], [place] [id number] Lindon Romp MD [id number]    Fungus (Mycology) Culture PENDING  Incomplete   Fungal Source CSF  Final  Fungus Culture Result     Status: None   Collection Time: 01/25/17  1:33 PM  Result Value Ref Range Status   Result 1 Comment  Final    Comment: (NOTE) KOH/Calcofluor preparation:  no fungus observed. Performed At: [facility] [address] [place], [place] [id number] Darrel Hoover  F MD UE:[id number]       Radiology Studies: No results found.   Scheduled Meds: . amantadine  100 mg Oral BID WC  . azithromycin  1,200 mg Oral Weekly  . feeding supplement  1 Container Oral TID BM  . feeding supplement (ENSURE ENLIVE)  237 mL Oral TID BM  . fluconazole  400 mg Oral Daily  . lidocaine (PF)  5 mL Intradermal Once  . magnesium oxide  400 mg Oral BID  . mouth rinse  15 mL Mouth Rinse BID  . multivitamin with minerals  1 tablet Oral Daily  . senna-docusate  2 tablet Oral BID  . sodium chloride flush  3 mL Intravenous Q12H  . sulfamethoxazole-trimethoprim  1 tablet Oral Daily  . tamsulosin  0.4 mg Oral Daily  . valACYclovir  1,000 mg Oral BID   Continuous Infusions: . sodium chloride 50 mL/hr at 01/24/17 1754     LOS: 37 days   Time Spent in minutes   30  minutes  Cederick Broadnax D.O. on 01/27/2017 at 2:03 PM  Between 7am to 7pm - Pager - [phone]  After 7pm go to [email or web address] - password [email or web address]  And look for the night coverage person covering for me after hours  [facility] Office  [phone]

## 2017-01-27 NOTE — Progress Notes (Addendum)
Nutrition Follow-up  DOCUMENTATION CODES:   Severe malnutrition in context of acute illness/injury  INTERVENTION:   -Decrease Ensure Enlive po to BID, each supplement provides 350 kcal and 20 grams of protein -30 ml Prostat BID, each supplement provides 100 kcals and 15 grams protein -MVI daily -Snacks TID  NUTRITION DIAGNOSIS:   Malnutrition (Severe) related to acute illness (cryptococcal meningitis) as evidenced by percent weight loss, energy intake < 75% for > or equal to 1 month.  Ongoing  GOAL:   Patient will meet greater than or equal to 90% of their needs  Progressing  MONITOR:   PO intake, Supplement acceptance, Labs, Weight trends, Skin, I & O's  REASON FOR ASSESSMENT:   Consult Calorie Count  ASSESSMENT:   Pt with PMH of HIV/AIDS (hx of noncompliance in part due to incarceration) and chronic perirectal abscess which pt had I&D Dec 2017, Jan 2018 but has worsened since being in jail for the last 6 weeks also without HIV meds during this time. Pt admitted with perirectal abscess had repeat I&D 5/23. 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 Lumbar drain placed 5/29.    Pt advanced to regular diet with thin liquids on 01/25/17.   Pt lying in bed at time of visit. Pt able to communicate, but very soft spoken and difficult to understand.   Meal completion improving- noted 25-75% of meals (averaging 50% of meals). Suspect intake will continue to improve due to diet liberalization. Pt still requires feeding assistance from staff.   Noted pt has lost 10# (6.8%) x 1 month, which is significant for time frame.   Labs reviewed: Na: 131, Phos: 5.1, Mg: 2.5.  Diet Order:  Diet regular Room service appropriate? Yes; Fluid consistency: Thin; Fluid restriction: 1500 mL Fluid  Skin:  Wound (see comment) (st 2 sacrum, opwn buttock wound, closed back and perineum )  Last BM:  01/27/17  Height:   Ht Readings from Last 1 Encounters:  12/27/16 5'  6" (1.676 m)    Weight:   Wt Readings from Last 1 Encounters:  01/11/17 136 lb 11 oz (62 kg)    Ideal Body Weight:  59 kg  BMI:  Body mass index is 22.06 kg/m.  Estimated Nutritional Needs:   Kcal:  1800-2000  Protein:  90-110 grams  Fluid:  > 1.8 L/day  EDUCATION NEEDS:   No education needs identified at this time  Markevius Trombetta A. Jimmye Norman, RD, LDN, CDE Pager: [phone] After hours Pager: [phone]

## 2017-01-28 DIAGNOSIS — E43 Unspecified severe protein-calorie malnutrition: Secondary | ICD-10-CM | POA: Insufficient documentation

## 2017-01-28 HISTORY — DX: Unspecified severe protein-calorie malnutrition: E43

## 2017-01-28 LAB — RENAL FUNCTION PANEL
ANION GAP: 6 (ref 5–15)
Albumin: 2.5 g/dL — ABNORMAL LOW (ref 3.5–5.0)
BUN: 19 mg/dL (ref 6–20)
CHLORIDE: 103 mmol/L (ref 101–111)
CO2: 23 mmol/L (ref 22–32)
CREATININE: 1.05 mg/dL (ref 0.61–1.24)
Calcium: 9.3 mg/dL (ref 8.9–10.3)
Glucose, Bld: 87 mg/dL (ref 65–99)
Phosphorus: 4.7 mg/dL — ABNORMAL HIGH (ref 2.5–4.6)
Potassium: 4.4 mmol/L (ref 3.5–5.1)
Sodium: 132 mmol/L — ABNORMAL LOW (ref 135–145)

## 2017-01-28 LAB — CBC
HCT: 25.5 % — ABNORMAL LOW (ref 39.0–52.0)
HEMOGLOBIN: 8 g/dL — AB (ref 13.0–17.0)
MCH: 27.9 pg (ref 26.0–34.0)
MCHC: 31.4 g/dL (ref 30.0–36.0)
MCV: 88.9 fL (ref 78.0–100.0)
PLATELETS: 180 10*3/uL (ref 150–400)
RBC: 2.87 MIL/uL — AB (ref 4.22–5.81)
RDW: 17.2 % — ABNORMAL HIGH (ref 11.5–15.5)
WBC: 2.4 10*3/uL — AB (ref 4.0–10.5)

## 2017-01-28 LAB — MAGNESIUM: MAGNESIUM: 1.6 mg/dL — AB (ref 1.7–2.4)

## 2017-01-28 MED ORDER — MAGNESIUM SULFATE 2 GM/50ML IV SOLN
2.0000 g | Freq: Once | INTRAVENOUS | Status: AC
  Administered 2017-01-28: 2 g via INTRAVENOUS
  Filled 2017-01-28: qty 50

## 2017-01-28 NOTE — Progress Notes (Signed)
PROGRESS NOTE    William Pena  GMW:[id number] DOB: 10/06/1985 DOA: 12/20/2016 PCP: William Quant, MD   Chief Complaint  Patient presents with  . Rectal Bleeding  . Generalized Body Aches    Brief Narrative:  HPI on 12/20/2016 by William Pena (surgery) Pt is a 31 yo M with history of recurrent perirectal abscesses.  He was treated at [facility] with surgery in January by William Pena, but now is incarcerated in [place].  He has had multiple different types of drains, but currently just has a draining seton in place at the site of a fistula.  He has been to the ED 3 times in the last week.  Due to his current incarceration in [facility], he cannot be transferred to [place].  He reports severe rectal pain.  He has had fever to 103 and had sepsis workup this AM at [facility] long.  He did have a CT on 5/19 that showed small abscess and antibiotics were recommended.  He was not able to be compliant with that for unclear reasons.  He has also had abdominal pain. [name] also seen at [facility] for his AIDS by Dr. Tobie Pena.  [name] frequently homeless as well.  [name] reportedly on his HIV meds.  Last CD 4 count looks like 1 /microliter (10/04/2016) and last viral load looks like 50,936/mL (08/08/2016).  Interim history During admission he was also noted to have headache, fever, chills, night sweats, neck pain and lymphadenopathy. ID was consulted and recommended imaging of chest, LP and cultures be sent from perirectal I&D. Also his serum cryptococcal antigen titer was elevated raising the possibility of disseminated cryptococcal infection including lung involvement. Pulmonology was consulted by ID for bronchoscopy and washings in an effort to help determine if his CT changes reflect TB or possible pulmonary cryptococcal disease. Initially unable to perform bronchoscopy due to hypoxia. 5/28 patient had altered LOC, became very lethargic/obtunded requiring CT and LP, complicated by acute respiratory failure due to  altered mental status that required mechanical ventilation. He had a lumbar CSF drain to depression arise. Now extubated, drain removed. Failed swallowing evaluation and has Cor trak forte tube feeds. Mental status slightly better but waxing and waning. ID, neurology, neurosurgery and CCM consulted during hospital course. Care transferred to stepdown and [place] on 01/09/17. MS slowly improving. Cor Trak discontinued 6/12 and diet initiated. Transferred to medical bed 6/13.Acute kidney injury on 6/14 (creatinine 2.8), likely due to acute urinary retention & Amphotericin> resolved. 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 Lumbar puncture done on 01/20/2017 showed opening pressure of 31, better than before, it was 55 -CSF culture showed yeast -Discussed with infectious disease, William Pena. Patient will need repeat lumbar puncture as he did have an elevated opening pressure on previous lumbar puncture. -Interventional radiology consult appreciated, status post lumbar puncture on 01/25/2017. Opening pressure 22. CSF obtained and sent to the lab, results pending. -Discussed with William Pena, since patient is currently asymptomatic and alert, will defer VP shunt for now  Acute urinary retention -Continue Foley catheter and Flomax -Patient failed voiding trial twice  Acute kidney injury -Likely secondary to urinary retention and possibly amphotericin -Creatinine peaked to 2.84,  currently 1.05 -Continue to monitor BMP  Acute hypoxic respiratory failure secondary cryptococcal pneumonia -Required intubation, was extubated on  01/03/2017 -Patient currently on room air  Acute encephalopathy -Likely multi-factorial including cryptococcal meningitis, acute strokes, metabolic arrangements, medication versus respiratory etiology -Has been improving slowly. Not sure what patient's baseline is.  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 -Neurology consulted and appreciated -No role for statins or antiplatelets in the setting continue antifungal treatment  HIV/AIDS -Infectious disease following -CD4 count on admission 21, high viral load greater than 1 million -Continue prophylaxis with Bactrim and azithromycin -ART not initiated due to high risk of IR IS, OD initiated in 1-2 weeks per ID  Perirectal abscess with pain -Patient initially admitted by general surgery, status post IND -Continue dressing changes twice a day -Continue wound care -Continue pain control with both IV and oral formulations  Genital herpes -Continue valtrex 1g BID (started on 6/28) for 7 days then needs chronic suppression   Hypomagnesemia -Magnesium 1.6 today, will replace IV and continue to monitor   Diarrhea -Stool culture negative, resolved  Anemia of chronic disease -Stable, hemoglobin 8.0 -Continue to monitor CBC  Hypertension -Continue IV hydralazine as needed  Thrombocytopenia -Unknown etiology, possibly medication effect -Currently stable, no reports of bleeding -Platelets 180 -Continue to monitor CBC  Facial swelling -Appears to have resolved, likely related to hypoalbuminemia  Constipation -Continue bowel regimen  Physical deconditioning -Compensated by patient's comorbidities as well as prolonged hospitalization.  -Physical therapy assessed the patient, recommending SNF -Speech therapy assessed patient, recommended regular diet, thin liquids  Severe malnutrition  -in the context of chronic illness -nutrition consulted -continue feeding  supplementation  DVT Prophylaxis  SCDs  Code Status: Full  Family Communication: None at bedside  Disposition Plan: Admitted. Dispo to SNF when stable.   Consultants PCCM Infectious disease Interventional radiology General surgery Neurology Neurosurgery  Procedures  Lumbar puncture Lumbar drain placement on 5/28, discontinued 6/10 Intubation/extubation Cor Trak discontinued 01/10/2017 Lumbar puncture by interventional radiology on 01/25/2017  Antibiotics   Anti-infectives    Start     Dose/Rate Route Frequency Ordered Stop   01/26/17 1545  valACYclovir (VALTREX) tablet 1,000 mg     1,000 mg Oral 2 times daily 01/26/17 1535 01/29/17 2159   01/24/17 1100  fluconazole (DIFLUCAN) tablet 400 mg     400 mg Oral Daily 01/24/17 1059     01/19/17 1530  fluconazole (DIFLUCAN) IVPB 400 mg  Status:  Discontinued     400 mg 100 mL/hr over 120 Minutes Intravenous Every 24 hours 01/19/17 1442 01/24/17 1059   01/16/17 2200  sulfamethoxazole-trimethoprim (BACTRIM DS,SEPTRA DS) 800-160 MG per tablet 1 tablet     1 tablet Oral Daily 01/16/17 2035     01/13/17 1000  sulfamethoxazole-trimethoprim (BACTRIM,SEPTRA) 200-40 MG/5ML suspension 10 mL  Status:  Discontinued     10 mL Oral Daily 01/12/17 0952 01/12/17 1149   01/12/17 1300  amphotericin B liposome (AMBISOME) 240 mg in dextrose 5 % 500 mL IVPB  Status:  Discontinued     240 mg 250 mL/hr over 120 Minutes Intravenous Every 24 hours 01/12/17 1155 01/12/17 1203   01/12/17 1300  amphotericin B liposome (AMBISOME) 240 mg in dextrose 5 % 500 mL IVPB  Status:  Discontinued     240 mg 250 mL/hr over 120 Minutes Intravenous Every 24 hours 01/12/17 1203 01/19/17 1442   01/12/17 1200  flucytosine (ANCOBON) capsule 1,500 mg  Status:  Discontinued  1,500 mg Oral Every 6 hours 01/12/17 0952 01/19/17 1442   01/06/17 1000  azithromycin (ZITHROMAX) tablet 1,200 mg     1,200 mg Oral Weekly 01/04/17 2339     01/06/17 1000  amphotericin B liposome  (AMBISOME) 240 mg in dextrose 5 % 500 mL IVPB  Status:  Discontinued     4 mg/kg  59.8 kg 250 mL/hr over 120 Minutes Intravenous Every 24 hours 01/06/17 0815 01/12/17 1155   12/31/16 1000  sulfamethoxazole-trimethoprim (BACTRIM,SEPTRA) 200-40 MG/5ML suspension 10 mL  Status:  Discontinued     10 mL Per Tube Daily 12/30/16 1418 01/12/17 0952   12/31/16 1000  amphotericin B liposome (AMBISOME) 270 mg in dextrose 5 % 500 mL IVPB  Status:  Discontinued     4 mg/kg  67.6 kg 250 mL/hr over 120 Minutes Intravenous Every 24 hours 12/30/16 1510 01/06/17 0815   12/30/16 1000  azithromycin (ZITHROMAX) 200 MG/5ML suspension 1,200 mg  Status:  Discontinued     1,200 mg Per Tube Every Fri 12/26/16 2030 01/04/17 2339   12/29/16 1000  sulfamethoxazole-trimethoprim (BACTRIM,SEPTRA) 200-40 MG/5ML suspension 20 mL  Status:  Discontinued     20 mL Per Tube Daily 12/28/16 1226 12/30/16 1418   12/28/16 1000  amphotericin B liposome (AMBISOME) 240 mg in dextrose 5 % 500 mL IVPB  Status:  Discontinued     3.5 mg/kg  67.6 kg 250 mL/hr over 120 Minutes Intravenous Every 24 hours 12/28/16 0853 12/30/16 1510   12/28/16 0900  amphotericin B liposome (AMBISOME) 240 mg in dextrose 5 % 500 mL IVPB  Status:  Discontinued     3.5 mg/kg  67.6 kg 250 mL/hr over 120 Minutes Intravenous Every 24 hours 12/28/16 0832 12/28/16 0853   12/27/16 0000  flucytosine (ANCOBON) capsule 1,500 mg  Status:  Discontinued     1,500 mg Per Tube Every 6 hours 12/26/16 1909 01/12/17 0952   12/26/16 2200  sulfamethoxazole-trimethoprim (BACTRIM DS,SEPTRA DS) 800-160 MG per tablet 1 tablet  Status:  Discontinued     1 tablet Per Tube Every 12 hours 12/26/16 1909 12/26/16 1912   12/26/16 2200  valACYclovir (VALTREX) tablet 1,000 mg     1,000 mg Per Tube 2 times daily 12/26/16 1909 01/01/17 2359   12/26/16 2200  sulfamethoxazole-trimethoprim (BACTRIM,SEPTRA) 200-40 MG/5ML suspension 20 mL  Status:  Discontinued     20 mL Per Tube Every 12 hours  12/26/16 1912 12/28/16 1226   12/23/16 2200  sulfamethoxazole-trimethoprim (BACTRIM DS,SEPTRA DS) 800-160 MG per tablet 1 tablet  Status:  Discontinued     1 tablet Oral Every 12 hours 12/23/16 1822 12/26/16 1909   12/23/16 1900  cefTRIAXone (ROCEPHIN) 2 g in dextrose 5 % 50 mL IVPB  Status:  Discontinued     2 g 100 mL/hr over 30 Minutes Intravenous Every 24 hours 12/23/16 1821 12/28/16 1122   12/23/16 0900  amphotericin B liposome (AMBISOME) 200 mg in dextrose 5 % 500 mL IVPB  Status:  Discontinued     200 mg 250 mL/hr over 120 Minutes Intravenous Every 24 hours 12/22/16 0919 12/28/16 0832   12/23/16 0800  azithromycin (ZITHROMAX) tablet 1,200 mg  Status:  Discontinued     1,200 mg Oral Weekly 12/22/16 0811 12/26/16 2029   12/22/16 2200  valACYclovir (VALTREX) tablet 1,000 mg  Status:  Discontinued     1,000 mg Oral 2 times daily 12/22/16 1611 12/26/16 1909   12/22/16 1800  amoxicillin-clavulanate (AUGMENTIN) 875-125 MG per tablet 1 tablet  Status:  Discontinued     1 tablet Oral 2 times daily with meals 12/22/16 1446 12/23/16 1821   12/22/16 1800  doxycycline (VIBRA-TABS) tablet 100 mg  Status:  Discontinued     100 mg Oral 2 times daily with meals 12/22/16 1446 12/23/16 1821   12/22/16 1200  flucytosine (ANCOBON) capsule 1,500 mg  Status:  Discontinued     1,500 mg Oral Every 6 hours 12/22/16 0614 12/26/16 1909   12/22/16 1000  azithromycin (ZITHROMAX) tablet 250 mg  Status:  Discontinued     250 mg Oral Daily 12/21/16 0224 12/21/16 1131   12/22/16 1000  doxycycline (VIBRA-TABS) tablet 100 mg  Status:  Discontinued     100 mg Oral Every 12 hours 12/22/16 0942 12/22/16 1446   12/22/16 0000  amphotericin B liposome (AMBISOME) 190 mg in dextrose 5 % 500 mL IVPB  Status:  Discontinued     3 mg/kg  64.2 kg 250 mL/hr over 120 Minutes Intravenous Every 24 hours 12/21/16 2313 12/22/16 0919   12/22/16 0000  flucytosine (ANCOBON) capsule 250 mg  Status:  Discontinued     250 mg Oral Every 6  hours 12/21/16 2314 12/21/16 2325   12/22/16 0000  flucytosine (ANCOBON) capsule 1,500 mg  Status:  Discontinued     1,500 mg Oral Every 6 hours 12/21/16 2326 12/22/16 0614   12/21/16 1645  cefTRIAXone (ROCEPHIN) 2 g in dextrose 5 % 50 mL IVPB  Status:  Discontinued     2 g 100 mL/hr over 30 Minutes Intravenous Every 24 hours 12/21/16 1539 12/22/16 1446   12/21/16 1645  metroNIDAZOLE (FLAGYL) IVPB 500 mg  Status:  Discontinued     500 mg 100 mL/hr over 60 Minutes Intravenous Every 8 hours 12/21/16 1539 12/21/16 2348   12/21/16 1600  cefTRIAXone (ROCEPHIN) 2 g in dextrose 5 % 50 mL IVPB  Status:  Discontinued     2 g 100 mL/hr over 30 Minutes Intravenous Every 24 hours 12/21/16 1131 12/21/16 1558   12/21/16 1400  vancomycin (VANCOCIN) IVPB 750 mg/150 ml premix  Status:  Discontinued     750 mg 150 mL/hr over 60 Minutes Intravenous Every 8 hours 12/21/16 0142 12/22/16 0942   12/21/16 1000  sulfamethoxazole-trimethoprim (BACTRIM DS,SEPTRA DS) 800-160 MG per tablet 1 tablet  Status:  Discontinued     1 tablet Oral Daily 12/21/16 0058 12/21/16 0100   12/21/16 1000  abacavir-dolutegravir-lamiVUDine (TRIUMEQ) 600-50-300 MG per tablet 1 tablet  Status:  Discontinued     1 tablet Oral Daily 12/21/16 0204 12/22/16 0903   12/21/16 1000  sulfamethoxazole-trimethoprim (BACTRIM DS,SEPTRA DS) 800-160 MG per tablet 1 tablet  Status:  Discontinued     1 tablet Oral Daily 12/21/16 0204 12/23/16 1822   12/21/16 0800  ceFEPIme (MAXIPIME) 2 g in dextrose 5 % 50 mL IVPB  Status:  Discontinued     2 g 100 mL/hr over 30 Minutes Intravenous Every 8 hours 12/21/16 0137 12/21/16 1131   12/21/16 0600  piperacillin-tazobactam (ZOSYN) IVPB 3.375 g  Status:  Discontinued     3.375 g 12.5 mL/hr over 240 Minutes Intravenous Every 8 hours 12/21/16 0058 12/21/16 0132   12/21/16 0200  metroNIDAZOLE (FLAGYL) IVPB 500 mg  Status:  Discontinued     500 mg 100 mL/hr over 60 Minutes Intravenous Every 8 hours 12/21/16 0137  12/22/16 1446   12/21/16 0145  ceFEPIme (MAXIPIME) 2 g in dextrose 5 % 50 mL IVPB     2 g 100 mL/hr over 30 Minutes  Intravenous  Once 12/21/16 0137 12/21/16 0358   12/21/16 0145  vancomycin (VANCOCIN) IVPB 1000 mg/200 mL premix     1,000 mg 200 mL/hr over 60 Minutes Intravenous  Once 12/21/16 0142 12/21/16 0429   12/21/16 0130  piperacillin-tazobactam (ZOSYN) IVPB 3.375 g  Status:  Discontinued     3.375 g 100 mL/hr over 30 Minutes Intravenous  Once 12/21/16 0115 12/21/16 0132   12/21/16 0130  azithromycin (ZITHROMAX) powder 1 g     1 g Oral  Once 12/21/16 0125 12/21/16 0328      Subjective:   [name] seen and examined today.  Patient complains of some rectal pain this morning. Overall feels he is doing better, and feels his appetite has improved. Patient currently denies any headache, dizziness, visual changes, current chest pain, shortness of breath, abdominal pain, nausea or vomiting.   Objective:   Vitals:   01/26/17 2148 01/27/17 1607 01/27/17 2113 01/28/17 0602  BP: 102/63 (!) 92/58 (!) 97/59 (!) 98/55  Pulse: 92 95 88 82  Resp: 18 18 17 17   Temp:  98.9 F (37.2 C) 98.9 F (37.2 C) 98.2 F (36.8 C)  TempSrc:      SpO2: 100% 100% 100% 100%  Weight:      Height:        Intake/Output Summary (Last 24 hours) at 01/28/17 1032 Last data filed at 01/27/17 1531  Gross per 24 hour  Intake              120 ml  Output              801 ml  Net             -681 ml   Filed Weights   01/10/17 0900 01/11/17 0640 01/11/17 0905  Weight: 63 kg (138 lb 14.2 oz) 60 kg (132 lb 4.4 oz) 62 kg (136 lb 11 oz)   Exam  General: Well developed, thin, no distress  HEENT: NCAT, mucous membranes moist.   Cardiovascular: S1 S2 auscultated, no rubs, murmurs or gallops. Regular rate and rhythm.  Respiratory: Clear to auscultation bilaterally with equal chest rise  Abdomen: Soft, nontender, nondistended, + bowel sounds  Extremities: warm dry without cyanosis clubbing or  edema  Neuro: AAOx3, nonfocal  Skin: Without rashes exudates or nodules, multiple tattoos   Psych: appropriate, pleasant  Data Reviewed: I have personally reviewed following labs and imaging studies  CBC:  Recent Labs Lab 01/25/17 0809 01/26/17 0613 01/28/17 0344  WBC 2.2* 2.2* 2.4*  HGB 8.2* 8.0* 8.0*  HCT 26.2* 25.4* 25.5*  MCV 88.8 88.8 88.9  PLT 176 190 426   Basic Metabolic Panel:  Recent Labs Lab 01/24/17 0950 01/25/17 0809 01/26/17 0613 01/27/17 0527 01/28/17 0344  NA 134* 132* 132* 131* 132*  K 3.8 4.3 4.6 4.9 4.4  CL 104 102 102 100* 103  CO2 25 24 25 25 23   GLUCOSE 142* 94 82 84 87  BUN 15 13 13 15 19   CREATININE 0.93 0.97 0.97 0.99 1.05  CALCIUM 8.6* 9.0 8.9 9.0 9.3  MG 1.4* 1.5* 1.8 1.5* 1.6*  PHOS 3.8 4.0 5.3* 5.1* 4.7*   GFR: Estimated Creatinine Clearance: 89.4 mL/min (by C-G formula based on SCr of 1.05 mg/dL). Liver Function Tests:  Recent Labs Lab 01/24/17 0950 01/25/17 0809 01/26/17 8341 01/27/17 0527 01/28/17 0344  ALBUMIN 2.3* 2.4* 2.5* 2.5* 2.5*   No results for input(s): LIPASE, AMYLASE in the last 168 hours. No results for input(s): AMMONIA  in the last 168 hours. Coagulation Profile: No results for input(s): INR, PROTIME in the last 168 hours. Cardiac Enzymes: No results for input(s): CKTOTAL, CKMB, CKMBINDEX, TROPONINI in the last 168 hours. BNP (last 3 results) No results for input(s): PROBNP in the last 8760 hours. HbA1C: No results for input(s): HGBA1C in the last 72 hours. CBG: No results for input(s): GLUCAP in the last 168 hours. Lipid Profile: No results for input(s): CHOL, HDL, LDLCALC, TRIG, CHOLHDL, LDLDIRECT in the last 72 hours. Thyroid Function Tests: No results for input(s): TSH, T4TOTAL, FREET4, T3FREE, THYROIDAB in the last 72 hours. Anemia Panel: No results for input(s): VITAMINB12, FOLATE, FERRITIN, TIBC, IRON, RETICCTPCT in the last 72 hours. Urine analysis:    Component Value Date/Time    COLORURINE YELLOW 12/20/2016 0706   APPEARANCEUR CLEAR 12/20/2016 0706   LABSPEC 1.026 12/20/2016 0706   PHURINE 6.0 12/20/2016 0706   GLUCOSEU NEGATIVE 12/20/2016 0706   HGBUR NEGATIVE 12/20/2016 0706   BILIRUBINUR NEGATIVE 12/20/2016 0706   KETONESUR NEGATIVE 12/20/2016 0706   PROTEINUR 100 (A) 12/20/2016 0706   NITRITE NEGATIVE 12/20/2016 0706   LEUKOCYTESUR NEGATIVE 12/20/2016 0706   Sepsis Labs: @LABRCNTIP (procalcitonin:4,lacticidven:4)  ) Recent Results (from the past 240 hour(s))  CSF culture     Status: None   Collection Time: 01/20/17 11:38 AM  Result Value Ref Range Status   Specimen Description CSF  Final   Special Requests TUBE 2  Final   Gram Stain   Final    CYTOSPIN SMEAR WBC PRESENT, PREDOMINANTLY MONONUCLEAR ENCAPSULATED YEAST SEEN CRITICAL RESULT CALLED TO, READ BACK BY AND VERIFIED WITH: Ardelia Mems RN 13:40 01/20/17 ([email or web address])    Culture NO GROWTH 3 DAYS  Final   Report Status 01/23/2017 FINAL  Final  Fungus Culture With Stain     Status: None (Preliminary result)   Collection Time: 01/20/17 11:38 AM  Result Value Ref Range Status   Fungus Stain Final report  Final    Comment: (NOTE) Performed At: [facility] [place], [place] [id number] Lindon Romp MD [id number]    Fungus (Mycology) Culture PENDING  Incomplete   Fungal Source CSF  Final    Comment: TUBE 2  Fungus Culture Result     Status: None   Collection Time: 01/20/17 11:38 AM  Result Value Ref Range Status   Result 1 Comment  Final    Comment: (NOTE) KOH/Calcofluor preparation:  no fungus observed. Performed At: [facility] [place], [place] [id number] Lindon Romp MD [id number]   CSF culture     Status: None (Preliminary result)   Collection Time: 01/25/17  1:33 PM  Result Value Ref Range Status   Specimen Description CSF  Final   Special Requests NONE  Final   Gram Stain   Final    WBC PRESENT, PREDOMINANTLY  MONONUCLEAR YEAST CYTOSPIN SMEAR    Culture NO GROWTH 2 DAYS  Final   Report Status PENDING  Incomplete  Fungus Culture With Stain     Status: None (Preliminary result)   Collection Time: 01/25/17  1:33 PM  Result Value Ref Range Status   Fungus Stain Final report  Final    Comment: (NOTE) Performed At: [facility] [place], [place] [id number] Lindon Romp MD [id number]    Fungus (Mycology) Culture PENDING  Incomplete   Fungal Source CSF  Final  Fungus Culture Result     Status: None   Collection Time: 01/25/17  1:33 PM  Result Value Ref Range Status   Result 1 Comment  Final    Comment: (NOTE) KOH/Calcofluor preparation:  no fungus observed. Performed At: [facility] [address] [place], [place] [id number] Lindon Romp MD [id number]       Radiology Studies: No results found.   Scheduled Meds: . amantadine  100 mg Oral BID WC  . azithromycin  1,200 mg Oral Weekly  . feeding supplement (ENSURE ENLIVE)  237 mL Oral BID BM  . fluconazole  400 mg Oral Daily  . lidocaine (PF)  5 mL Intradermal Once  . magnesium oxide  400 mg Oral BID  . mouth rinse  15 mL Mouth Rinse BID  . multivitamin with minerals  1 tablet Oral Daily  . senna-docusate  2 tablet Oral BID  . sodium chloride flush  3 mL Intravenous Q12H  . sulfamethoxazole-trimethoprim  1 tablet Oral Daily  . tamsulosin  0.4 mg Oral Daily  . valACYclovir  1,000 mg Oral BID   Continuous Infusions: . sodium chloride 50 mL/hr at 01/24/17 1754     LOS: 38 days   Time Spent in minutes   30  minutes  Jowana Thumma D.O. on 01/28/2017 at 10:32 AM  Between 7am to 7pm - Pager - [phone]  After 7pm go to [email or web address] - password [email or web address]  And look for the night coverage person covering for me after hours  [facility] Office  [phone]

## 2017-01-28 NOTE — Progress Notes (Signed)
Subjective: No new complaints   Antibiotics:  Anti-infectives    Start     Dose/Rate Route Frequency Ordered Stop   01/26/17 1545  valACYclovir (VALTREX) tablet 1,000 mg     1,000 mg Oral 2 times daily 01/26/17 1535 01/29/17 2159   01/24/17 1100  fluconazole (DIFLUCAN) tablet 400 mg     400 mg Oral Daily 01/24/17 1059     01/19/17 1530  fluconazole (DIFLUCAN) IVPB 400 mg  Status:  Discontinued     400 mg 100 mL/hr over 120 Minutes Intravenous Every 24 hours 01/19/17 1442 01/24/17 1059   01/16/17 2200  sulfamethoxazole-trimethoprim (BACTRIM DS,SEPTRA DS) 800-160 MG per tablet 1 tablet     1 tablet Oral Daily 01/16/17 2035     01/13/17 1000  sulfamethoxazole-trimethoprim (BACTRIM,SEPTRA) 200-40 MG/5ML suspension 10 mL  Status:  Discontinued     10 mL Oral Daily 01/12/17 0952 01/12/17 1149   01/12/17 1300  amphotericin B liposome (AMBISOME) 240 mg in dextrose 5 % 500 mL IVPB  Status:  Discontinued     240 mg 250 mL/hr over 120 Minutes Intravenous Every 24 hours 01/12/17 1155 01/12/17 1203   01/12/17 1300  amphotericin B liposome (AMBISOME) 240 mg in dextrose 5 % 500 mL IVPB  Status:  Discontinued     240 mg 250 mL/hr over 120 Minutes Intravenous Every 24 hours 01/12/17 1203 01/19/17 1442   01/12/17 1200  flucytosine (ANCOBON) capsule 1,500 mg  Status:  Discontinued     1,500 mg Oral Every 6 hours 01/12/17 0952 01/19/17 1442   01/06/17 1000  azithromycin (ZITHROMAX) tablet 1,200 mg     1,200 mg Oral Weekly 01/04/17 2339     01/06/17 1000  amphotericin B liposome (AMBISOME) 240 mg in dextrose 5 % 500 mL IVPB  Status:  Discontinued     4 mg/kg  59.8 kg 250 mL/hr over 120 Minutes Intravenous Every 24 hours 01/06/17 0815 01/12/17 1155   12/31/16 1000  sulfamethoxazole-trimethoprim (BACTRIM,SEPTRA) 200-40 MG/5ML suspension 10 mL  Status:  Discontinued     10 mL Per Tube Daily 12/30/16 1418 01/12/17 0952   12/31/16 1000  amphotericin B liposome (AMBISOME) 270 mg in dextrose 5 %  500 mL IVPB  Status:  Discontinued     4 mg/kg  67.6 kg 250 mL/hr over 120 Minutes Intravenous Every 24 hours 12/30/16 1510 01/06/17 0815   12/30/16 1000  azithromycin (ZITHROMAX) 200 MG/5ML suspension 1,200 mg  Status:  Discontinued     1,200 mg Per Tube Every Fri 12/26/16 2030 01/04/17 2339   12/29/16 1000  sulfamethoxazole-trimethoprim (BACTRIM,SEPTRA) 200-40 MG/5ML suspension 20 mL  Status:  Discontinued     20 mL Per Tube Daily 12/28/16 1226 12/30/16 1418   12/28/16 1000  amphotericin B liposome (AMBISOME) 240 mg in dextrose 5 % 500 mL IVPB  Status:  Discontinued     3.5 mg/kg  67.6 kg 250 mL/hr over 120 Minutes Intravenous Every 24 hours 12/28/16 0853 12/30/16 1510   12/28/16 0900  amphotericin B liposome (AMBISOME) 240 mg in dextrose 5 % 500 mL IVPB  Status:  Discontinued     3.5 mg/kg  67.6 kg 250 mL/hr over 120 Minutes Intravenous Every 24 hours 12/28/16 0832 12/28/16 0853   12/27/16 0000  flucytosine (ANCOBON) capsule 1,500 mg  Status:  Discontinued     1,500 mg Per Tube Every 6 hours 12/26/16 1909 01/12/17 0952   12/26/16 2200  sulfamethoxazole-trimethoprim (BACTRIM DS,SEPTRA DS) 800-160 MG per  tablet 1 tablet  Status:  Discontinued     1 tablet Per Tube Every 12 hours 12/26/16 1909 12/26/16 1912   12/26/16 2200  valACYclovir (VALTREX) tablet 1,000 mg     1,000 mg Per Tube 2 times daily 12/26/16 1909 01/01/17 2359   12/26/16 2200  sulfamethoxazole-trimethoprim (BACTRIM,SEPTRA) 200-40 MG/5ML suspension 20 mL  Status:  Discontinued     20 mL Per Tube Every 12 hours 12/26/16 1912 12/28/16 1226   12/23/16 2200  sulfamethoxazole-trimethoprim (BACTRIM DS,SEPTRA DS) 800-160 MG per tablet 1 tablet  Status:  Discontinued     1 tablet Oral Every 12 hours 12/23/16 1822 12/26/16 1909   12/23/16 1900  cefTRIAXone (ROCEPHIN) 2 g in dextrose 5 % 50 mL IVPB  Status:  Discontinued     2 g 100 mL/hr over 30 Minutes Intravenous Every 24 hours 12/23/16 1821 12/28/16 1122   12/23/16 0900   amphotericin B liposome (AMBISOME) 200 mg in dextrose 5 % 500 mL IVPB  Status:  Discontinued     200 mg 250 mL/hr over 120 Minutes Intravenous Every 24 hours 12/22/16 0919 12/28/16 0832   12/23/16 0800  azithromycin (ZITHROMAX) tablet 1,200 mg  Status:  Discontinued     1,200 mg Oral Weekly 12/22/16 0811 12/26/16 2029   12/22/16 2200  valACYclovir (VALTREX) tablet 1,000 mg  Status:  Discontinued     1,000 mg Oral 2 times daily 12/22/16 1611 12/26/16 1909   12/22/16 1800  amoxicillin-clavulanate (AUGMENTIN) 875-125 MG per tablet 1 tablet  Status:  Discontinued     1 tablet Oral 2 times daily with meals 12/22/16 1446 12/23/16 1821   12/22/16 1800  doxycycline (VIBRA-TABS) tablet 100 mg  Status:  Discontinued     100 mg Oral 2 times daily with meals 12/22/16 1446 12/23/16 1821   12/22/16 1200  flucytosine (ANCOBON) capsule 1,500 mg  Status:  Discontinued     1,500 mg Oral Every 6 hours 12/22/16 0614 12/26/16 1909   12/22/16 1000  azithromycin (ZITHROMAX) tablet 250 mg  Status:  Discontinued     250 mg Oral Daily 12/21/16 0224 12/21/16 1131   12/22/16 1000  doxycycline (VIBRA-TABS) tablet 100 mg  Status:  Discontinued     100 mg Oral Every 12 hours 12/22/16 0942 12/22/16 1446   12/22/16 0000  amphotericin B liposome (AMBISOME) 190 mg in dextrose 5 % 500 mL IVPB  Status:  Discontinued     3 mg/kg  64.2 kg 250 mL/hr over 120 Minutes Intravenous Every 24 hours 12/21/16 2313 12/22/16 0919   12/22/16 0000  flucytosine (ANCOBON) capsule 250 mg  Status:  Discontinued     250 mg Oral Every 6 hours 12/21/16 2314 12/21/16 2325   12/22/16 0000  flucytosine (ANCOBON) capsule 1,500 mg  Status:  Discontinued     1,500 mg Oral Every 6 hours 12/21/16 2326 12/22/16 0614   12/21/16 1645  cefTRIAXone (ROCEPHIN) 2 g in dextrose 5 % 50 mL IVPB  Status:  Discontinued     2 g 100 mL/hr over 30 Minutes Intravenous Every 24 hours 12/21/16 1539 12/22/16 1446   12/21/16 1645  metroNIDAZOLE (FLAGYL) IVPB 500 mg  Status:   Discontinued     500 mg 100 mL/hr over 60 Minutes Intravenous Every 8 hours 12/21/16 1539 12/21/16 2348   12/21/16 1600  cefTRIAXone (ROCEPHIN) 2 g in dextrose 5 % 50 mL IVPB  Status:  Discontinued     2 g 100 mL/hr over 30 Minutes Intravenous Every 24 hours 12/21/16  1131 12/21/16 1558   12/21/16 1400  vancomycin (VANCOCIN) IVPB 750 mg/150 ml premix  Status:  Discontinued     750 mg 150 mL/hr over 60 Minutes Intravenous Every 8 hours 12/21/16 0142 12/22/16 0942   12/21/16 1000  sulfamethoxazole-trimethoprim (BACTRIM DS,SEPTRA DS) 800-160 MG per tablet 1 tablet  Status:  Discontinued     1 tablet Oral Daily 12/21/16 0058 12/21/16 0100   12/21/16 1000  abacavir-dolutegravir-lamiVUDine (TRIUMEQ) 600-50-300 MG per tablet 1 tablet  Status:  Discontinued     1 tablet Oral Daily 12/21/16 0204 12/22/16 0903   12/21/16 1000  sulfamethoxazole-trimethoprim (BACTRIM DS,SEPTRA DS) 800-160 MG per tablet 1 tablet  Status:  Discontinued     1 tablet Oral Daily 12/21/16 0204 12/23/16 1822   12/21/16 0800  ceFEPIme (MAXIPIME) 2 g in dextrose 5 % 50 mL IVPB  Status:  Discontinued     2 g 100 mL/hr over 30 Minutes Intravenous Every 8 hours 12/21/16 0137 12/21/16 1131   12/21/16 0600  piperacillin-tazobactam (ZOSYN) IVPB 3.375 g  Status:  Discontinued     3.375 g 12.5 mL/hr over 240 Minutes Intravenous Every 8 hours 12/21/16 0058 12/21/16 0132   12/21/16 0200  metroNIDAZOLE (FLAGYL) IVPB 500 mg  Status:  Discontinued     500 mg 100 mL/hr over 60 Minutes Intravenous Every 8 hours 12/21/16 0137 12/22/16 1446   12/21/16 0145  ceFEPIme (MAXIPIME) 2 g in dextrose 5 % 50 mL IVPB     2 g 100 mL/hr over 30 Minutes Intravenous  Once 12/21/16 0137 12/21/16 0358   12/21/16 0145  vancomycin (VANCOCIN) IVPB 1000 mg/200 mL premix     1,000 mg 200 mL/hr over 60 Minutes Intravenous  Once 12/21/16 0142 12/21/16 0429   12/21/16 0130  piperacillin-tazobactam (ZOSYN) IVPB 3.375 g  Status:  Discontinued     3.375 g 100 mL/hr  over 30 Minutes Intravenous  Once 12/21/16 0115 12/21/16 0132   12/21/16 0130  azithromycin (ZITHROMAX) powder 1 g     1 g Oral  Once 12/21/16 0125 12/21/16 0328      Medications: Scheduled Meds: . amantadine  100 mg Oral BID WC  . azithromycin  1,200 mg Oral Weekly  . feeding supplement (ENSURE ENLIVE)  237 mL Oral BID BM  . fluconazole  400 mg Oral Daily  . lidocaine (PF)  5 mL Intradermal Once  . magnesium oxide  400 mg Oral BID  . mouth rinse  15 mL Mouth Rinse BID  . multivitamin with minerals  1 tablet Oral Daily  . senna-docusate  2 tablet Oral BID  . sodium chloride flush  3 mL Intravenous Q12H  . sulfamethoxazole-trimethoprim  1 tablet Oral Daily  . tamsulosin  0.4 mg Oral Daily  . valACYclovir  1,000 mg Oral BID   Continuous Infusions: . sodium chloride 50 mL/hr at 01/24/17 1754   PRN Meds:.acetaminophen (TYLENOL) oral liquid 160 mg/5 mL, acetaminophen, docusate sodium, hydrALAZINE, HYDROcodone-acetaminophen, morphine injection, [DISCONTINUED] ondansetron **OR** ondansetron (ZOFRAN) IV, polyethylene glycol, RESOURCE THICKENUP CLEAR    Objective: Weight change:   Intake/Output Summary (Last 24 hours) at 01/28/17 1631 Last data filed at 01/28/17 1629  Gross per 24 hour  Intake              120 ml  Output             1651 ml  Net            -1531 ml   Blood pressure 100/60, pulse 96,  temperature 98.3 F (36.8 C), temperature source Oral, resp. rate 16, height 5\' 6"  (1.676 m), weight 136 lb 11 oz (62 kg), SpO2 100 %. Temp:  [98.2 F (36.8 C)-98.9 F (37.2 C)] 98.3 F (36.8 C) (06/30 1531) Pulse Rate:  [82-96] 96 (06/30 1531) Resp:  [16-17] 16 (06/30 1531) BP: (97-100)/(55-60) 100/60 (06/30 1531) SpO2:  [100 %] 100 % (06/30 1531)  Physical Exam: General: very slowed compared to when I saw him last but answers some questions with nods of head HEENT: , EOMI CVS regular rate, normal r,  no murmur rubs or gallops Chest: clear to auscultation bilaterally, no  wheezing, rales or rhonchi Abdomen: soft nontender, nondistended, normal bowel sounds, Extremities: no  clubbing or edema noted bilaterally Skin: wound not examined  Neuro: nonfocal  CBC:  CBC Latest Ref Rng & Units 01/28/2017 01/26/2017 01/25/2017  WBC 4.0 - 10.5 K/uL 2.4(L) 2.2(L) 2.2(L)  Hemoglobin 13.0 - 17.0 g/dL 8.0(L) 8.0(L) 8.2(L)  Hematocrit 39.0 - 52.0 % 25.5(L) 25.4(L) 26.2(L)  Platelets 150 - 400 K/uL 180 190 176     BMET  Recent Labs  01/27/17 0527 01/28/17 0344  NA 131* 132*  K 4.9 4.4  CL 100* 103  CO2 25 23  GLUCOSE 84 87  BUN 15 19  CREATININE 0.99 1.05  CALCIUM 9.0 9.3     Liver Panel   Recent Labs  01/27/17 0527 01/28/17 0344  ALBUMIN 2.5* 2.5*       Sedimentation Rate No results for input(s): ESRSEDRATE in the last 72 hours. C-Reactive Protein No results for input(s): CRP in the last 72 hours.  Micro Results: Recent Results (from the past 720 hour(s))  CSF culture with Stat gram stain     Status: None   Collection Time: 12/30/16  2:14 PM  Result Value Ref Range Status   Specimen Description CSF  Final   Special Requests Immunocompromised  Final   Gram Stain   Final    WBC PRESENT, PREDOMINANTLY MONONUCLEAR ENCAPSULATED YEAST SEEN CYTOSPIN SMEAR    Culture RARE CRYPTOCOCCUS NEOFORMANS  Final   Report Status 01/05/2017 FINAL  Final  Fungus Culture With Stain     Status: None (Preliminary result)   Collection Time: 12/30/16  2:14 PM  Result Value Ref Range Status   Fungus Stain Final report  Final    Comment: (NOTE) Performed At: [facility] [place], [place] [id number] Lindon Romp MD [id number]    Fungus (Mycology) Culture PENDING  Incomplete  Fungus Culture Result     Status: None   Collection Time: 12/30/16  2:14 PM  Result Value Ref Range Status   Result 1 Comment  Final    Comment: (NOTE) Fungal elements, such as arthroconidia, hyphal fragments, chlamydoconidia, observed. Performed At:  [facility] [address] [place], [place] [id number] Lindon Romp MD [id number]   CSF culture with Stat gram stain     Status: None   Collection Time: 01/05/17  9:00 AM  Result Value Ref Range Status   Specimen Description CSF  Final   Special Requests Immunocompromised  Final   Gram Stain   Final    WBC PRESENT, PREDOMINANTLY MONONUCLEAR YEAST CRITICAL RESULT CALLED TO, READ BACK BY AND VERIFIED WITH: Barnett Abu, RN AT 1112 ON 01/05/17 BY C. JESSUP, MLT. CYTOSPIN SMEAR    Culture NO GROWTH 3 DAYS  Final   Report Status 01/09/2017 FINAL  Final  CSF culture     Status: None  Collection Time: 01/20/17 11:38 AM  Result Value Ref Range Status   Specimen Description CSF  Final   Special Requests TUBE 2  Final   Gram Stain   Final    CYTOSPIN SMEAR WBC PRESENT, PREDOMINANTLY MONONUCLEAR ENCAPSULATED YEAST SEEN CRITICAL RESULT CALLED TO, READ BACK BY AND VERIFIED WITH: Ardelia Mems RN 13:40 01/20/17 ([email or web address])    Culture NO GROWTH 3 DAYS  Final   Report Status 01/23/2017 FINAL  Final  Fungus Culture With Stain     Status: None (Preliminary result)   Collection Time: 01/20/17 11:38 AM  Result Value Ref Range Status   Fungus Stain Final report  Final    Comment: (NOTE) Performed At: [facility] [place], [place] [id number] Lindon Romp MD [id number]    Fungus (Mycology) Culture PENDING  Incomplete   Fungal Source CSF  Final    Comment: TUBE 2  Fungus Culture Result     Status: None   Collection Time: 01/20/17 11:38 AM  Result Value Ref Range Status   Result 1 Comment  Final    Comment: (NOTE) KOH/Calcofluor preparation:  no fungus observed. Performed At: [facility] [place], [place] [id number] Lindon Romp MD [id number]   CSF culture     Status: None (Preliminary result)   Collection Time: 01/25/17  1:33 PM  Result Value Ref Range Status   Specimen Description CSF  Final   Special Requests NONE   Final   Gram Stain   Final    WBC PRESENT, PREDOMINANTLY MONONUCLEAR YEAST CYTOSPIN SMEAR CRITICAL RESULT CALLED TO, READ BACK BY AND VERIFIED WITH: Sharlene Dory RN, AT 1093 01/28/17 BY D. VANHOOK    Culture NO GROWTH 3 DAYS  Final   Report Status PENDING  Incomplete  Fungus Culture With Stain     Status: None (Preliminary result)   Collection Time: 01/25/17  1:33 PM  Result Value Ref Range Status   Fungus Stain Final report  Final    Comment: (NOTE) Performed At: [facility] [place], [place] [id number] Lindon Romp MD [id number]    Fungus (Mycology) Culture PENDING  Incomplete   Fungal Source CSF  Final  Fungus Culture Result     Status: None   Collection Time: 01/25/17  1:33 PM  Result Value Ref Range Status   Result 1 Comment  Final    Comment: (NOTE) KOH/Calcofluor preparation:  no fungus observed. Performed At: [facility] [place], [place] [id number] Lindon Romp MD [id number]     Studies/Results: No results found.    Assessment/Plan:  INTERVAL HISTORY: pt still fairly slow   Principal Problem:   Perirectal abscess Active Problems:   Chronic pain   AIDS (acquired immune deficiency syndrome) (HCC)   Hyponatremia   Normocytic anemia   Elevated LFTs   Pulmonary infiltrates   Hypomagnesemia   Encephalopathy acute   SIRS (systemic inflammatory response syndrome) (HCC)   Acute respiratory failure (HCC)   Rectal abscess   Cryptococcal meningoencephalitis (HCC)   Cryptococcosis (HCC)   Cavitary pneumonia   Diffuse lymphadenopathy   Drug rash   Elevated intracranial pressure   HIV disease (HCC)   Lymphadenopathy of head and neck   HIV (human immunodeficiency virus infection) ([place])   Meningitis   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 male with   Disseminated cryptococcal meningitis with vascular infarct , high ICP requiring EVD, serial LPs after drain taken out, HIV/AIDS, perirectal abcess  #1 CM:  --continue fluconazole --I would ask IR to obtain repeat LP on Monday at the latest to repeat his  IF he gets HA would get LP immediately  When getting LP please check   OPENING PRESSURE and remove as much CSF as possible (preferably 65-79UX) and certainly enough to drop his closing presure to < 20 cm H20  Would send for repeat CSF glucose and protein CSF cell count and differential CSF culture CSF crypto antigen  [name] AT VERY HIGH RISK FOR IRIS, which is why I would have preferred he had shunt in place as I worry that when he develops IRIS his ICP will become similar to when he was diagnosed and would prefer to avoid need for emergency Neurosurgery  For now continue to hold of on ARV restart. May want to pick a regimen with slower decline in viral load  #2 HIV/AIDS: see above and continue OI prophylaxis  #3 Perirectal abscess: continuing wound care     LOS: 38 days   Alcide Evener 01/28/2017, 4:31 PM

## 2017-01-29 LAB — CBC
HCT: 24.7 % — ABNORMAL LOW (ref 39.0–52.0)
HEMOGLOBIN: 7.6 g/dL — AB (ref 13.0–17.0)
MCH: 27.5 pg (ref 26.0–34.0)
MCHC: 30.8 g/dL (ref 30.0–36.0)
MCV: 89.5 fL (ref 78.0–100.0)
PLATELETS: 174 10*3/uL (ref 150–400)
RBC: 2.76 MIL/uL — ABNORMAL LOW (ref 4.22–5.81)
RDW: 17.3 % — ABNORMAL HIGH (ref 11.5–15.5)
WBC: 3 10*3/uL — ABNORMAL LOW (ref 4.0–10.5)

## 2017-01-29 LAB — RENAL FUNCTION PANEL
ALBUMIN: 2.5 g/dL — AB (ref 3.5–5.0)
ANION GAP: 6 (ref 5–15)
BUN: 20 mg/dL (ref 6–20)
CALCIUM: 9.4 mg/dL (ref 8.9–10.3)
CO2: 24 mmol/L (ref 22–32)
CREATININE: 1.07 mg/dL (ref 0.61–1.24)
Chloride: 101 mmol/L (ref 101–111)
GFR calc Af Amer: >60 mL/min (ref >60)
GFR calc non Af Amer: >60 mL/min (ref >60)
GLUCOSE: 85 mg/dL (ref 65–99)
PHOSPHORUS: 4.9 mg/dL — AB (ref 2.5–4.6)
Potassium: 4.5 mmol/L (ref 3.5–5.1)
SODIUM: 131 mmol/L — AB (ref 135–145)

## 2017-01-29 LAB — MAGNESIUM: Magnesium: 1.7 mg/dL (ref 1.7–2.4)

## 2017-01-29 LAB — CSF CULTURE: CULTURE: NO GROWTH

## 2017-01-29 LAB — CSF CULTURE W GRAM STAIN

## 2017-01-29 LAB — HEMOGLOBIN AND HEMATOCRIT, BLOOD
HEMATOCRIT: 23.8 % — AB (ref 39.0–52.0)
HEMOGLOBIN: 7.6 g/dL — AB (ref 13.0–17.0)

## 2017-01-29 NOTE — Progress Notes (Signed)
PROGRESS NOTE    William Pena  XVQ:[id number] DOB: 1986-06-28 DOA: 12/20/2016 PCP: [name], MD   Chief Complaint  Patient presents with  . Rectal Bleeding  . Generalized Body Aches    Brief Narrative:  HPI on 12/20/2016 by Dr. Stark Klein (surgery) Pt is a 31 yo M with history of recurrent perirectal abscesses.  He was treated at [facility] with surgery in January by Dr. Ronita Hipps, but now is incarcerated in [place].  He has had multiple different types of drains, but currently just has a draining seton in place at the site of a fistula.  He has been to the ED 3 times in the last week.  Due to his current incarceration in [facility], he cannot be transferred to [place].  He reports severe rectal pain.  He has had fever to 103 and had sepsis workup this AM at [facility].  He did have a CT on 5/19 that showed small abscess and antibiotics were recommended.  He was not able to be compliant with that for unclear reasons.  He has also had abdominal pain. [name] also seen at [facility] for his AIDS by Dr. Tobie Poet.  [name] frequently homeless as well.  [name] reportedly on his HIV meds.  Last CD 4 count looks like 1 /microliter (10/04/2016) and last viral load looks like 50,936/mL (08/08/2016).  Interim history During admission he was also noted to have headache, fever, chills, night sweats, neck pain and lymphadenopathy. ID was consulted and recommended imaging of chest, LP and cultures be sent from perirectal I&D. Also his serum cryptococcal antigen titer was elevated raising the possibility of disseminated cryptococcal infection including lung involvement. Pulmonology was consulted by ID for bronchoscopy and washings in an effort to help determine if his CT changes reflect TB or possible pulmonary cryptococcal disease. Initially unable to perform bronchoscopy due to hypoxia. 5/28 patient had altered LOC, became very lethargic/obtunded requiring CT and LP, complicated by acute respiratory failure due to  altered mental status that required mechanical ventilation. He had a lumbar CSF drain to depression arise. Now extubated, drain removed. Failed swallowing evaluation and has Cor trak forte tube feeds. Mental status slightly better but waxing and waning. ID, neurology, neurosurgery and CCM consulted during hospital course. Care transferred to stepdown and [place] on 01/09/17. MS slowly improving. Cor Trak discontinued 6/12 and diet initiated. Transferred to medical bed 6/13.Acute kidney injury on 6/14 (creatinine 2.8), likely due to acute urinary retention & Amphotericin> resolved. 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 Lumbar puncture done on 01/20/2017 showed opening pressure of 31, better than before, it was 55 -CSF culture showed yeast -Discussed with infectious disease, Dr. Baxter Flattery. Patient will need repeat lumbar puncture as he did have an elevated opening pressure on previous lumbar puncture. -Interventional radiology consult appreciated, status post lumbar puncture on 01/25/2017. Opening pressure 22. CSF obtained and sent to the lab, results pending. -Discussed with Dr. Baxter Flattery, since patient is currently asymptomatic and alert, will defer VP shunt for now  Acute urinary retention -Continue Foley catheter and Flomax -Patient failed voiding trial twice  Acute kidney injury -Likely secondary to urinary retention and possibly amphotericin -Creatinine peaked to 2.84,  currently 1.07 -Continue to monitor BMP  Acute hypoxic respiratory failure secondary cryptococcal pneumonia -Required intubation, was extubated on  01/03/2017 -Patient currently on room air  Acute encephalopathy -Likely multi-factorial including cryptococcal meningitis, acute strokes, metabolic arrangements, medication versus respiratory etiology -Has been improving slowly. Not sure what patient's baseline is.  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 -Neurology consulted and appreciated -No role for statins or antiplatelets in the setting continue antifungal treatment  HIV/AIDS -Infectious disease following -CD4 count on admission 21, high viral load greater than 1 million -Continue prophylaxis with Bactrim and azithromycin -ART not initiated due to high risk of IR IS, OD initiated in 1-2 weeks per ID  Perirectal abscess with pain -Patient initially admitted by general surgery, status post IND -Continue dressing changes twice a day -Continue wound care -Continue pain control with both IV and oral formulations  Genital herpes -Continue valtrex 1g BID (started on 6/28) for 7 days then needs chronic suppression   Hypomagnesemia -Magnesium 1.6 today, will replace IV and continue to monitor   Diarrhea -Stool culture negative, resolved  Anemia of chronic disease -Hemoglobin was stable over the past several days.  However down to 7.6 this morning. -Will repeat H/H now. Not sure why order was discontinued.  -Continue to monitor CBC  Hypertension -Continue IV hydralazine as needed  Thrombocytopenia -Unknown etiology, possibly medication effect -Currently stable, no reports of bleeding -Platelets 174 -Continue to monitor CBC  Facial swelling -Appears to have resolved, likely related to hypoalbuminemia  Constipation -Continue bowel regimen  Physical deconditioning -Compensated by patient's comorbidities as well as prolonged hospitalization.  -Physical therapy assessed the patient, recommending SNF -Speech therapy assessed patient, recommended regular diet, thin  liquids  Severe malnutrition  -in the context of chronic illness -nutrition consulted -continue feeding supplementation  DVT Prophylaxis  SCDs  Code Status: Full  Family Communication: None at bedside  Disposition Plan: Admitted. Dispo to SNF when stable.   Consultants PCCM Infectious disease Interventional radiology General surgery Neurology Neurosurgery  Procedures  Lumbar puncture Lumbar drain placement on 5/28, discontinued 6/10 Intubation/extubation Cor Trak discontinued 01/10/2017 Lumbar puncture by interventional radiology on 01/25/2017  Antibiotics   Anti-infectives    Start     Dose/Rate Route Frequency Ordered Stop   01/26/17 1545  valACYclovir (VALTREX) tablet 1,000 mg     1,000 mg Oral 2 times daily 01/26/17 1535 01/29/17 0816   01/24/17 1100  fluconazole (DIFLUCAN) tablet 400 mg     400 mg Oral Daily 01/24/17 1059     01/19/17 1530  fluconazole (DIFLUCAN) IVPB 400 mg  Status:  Discontinued     400 mg 100 mL/hr over 120 Minutes Intravenous Every 24 hours 01/19/17 1442 01/24/17 1059   01/16/17 2200  sulfamethoxazole-trimethoprim (BACTRIM DS,SEPTRA DS) 800-160 MG per tablet 1 tablet     1 tablet Oral Daily 01/16/17 2035     01/13/17 1000  sulfamethoxazole-trimethoprim (BACTRIM,SEPTRA) 200-40 MG/5ML suspension 10 mL  Status:  Discontinued     10 mL Oral Daily 01/12/17 0952 01/12/17 1149   01/12/17 1300  amphotericin B liposome (AMBISOME) 240 mg in dextrose 5 % 500 mL IVPB  Status:  Discontinued     240 mg 250 mL/hr over 120 Minutes Intravenous Every 24 hours 01/12/17 1155 01/12/17 1203   01/12/17 1300  amphotericin B liposome (AMBISOME) 240 mg in dextrose 5 % 500 mL IVPB  Status:  Discontinued     240 mg 250 mL/hr over 120 Minutes Intravenous Every  24 hours 01/12/17 1203 01/19/17 1442   01/12/17 1200  flucytosine (ANCOBON) capsule 1,500 mg  Status:  Discontinued     1,500 mg Oral Every 6 hours 01/12/17 0952 01/19/17 1442   01/06/17 1000  azithromycin  (ZITHROMAX) tablet 1,200 mg     1,200 mg Oral Weekly 01/04/17 2339     01/06/17 1000  amphotericin B liposome (AMBISOME) 240 mg in dextrose 5 % 500 mL IVPB  Status:  Discontinued     4 mg/kg  59.8 kg 250 mL/hr over 120 Minutes Intravenous Every 24 hours 01/06/17 0815 01/12/17 1155   12/31/16 1000  sulfamethoxazole-trimethoprim (BACTRIM,SEPTRA) 200-40 MG/5ML suspension 10 mL  Status:  Discontinued     10 mL Per Tube Daily 12/30/16 1418 01/12/17 0952   12/31/16 1000  amphotericin B liposome (AMBISOME) 270 mg in dextrose 5 % 500 mL IVPB  Status:  Discontinued     4 mg/kg  67.6 kg 250 mL/hr over 120 Minutes Intravenous Every 24 hours 12/30/16 1510 01/06/17 0815   12/30/16 1000  azithromycin (ZITHROMAX) 200 MG/5ML suspension 1,200 mg  Status:  Discontinued     1,200 mg Per Tube Every Fri 12/26/16 2030 01/04/17 2339   12/29/16 1000  sulfamethoxazole-trimethoprim (BACTRIM,SEPTRA) 200-40 MG/5ML suspension 20 mL  Status:  Discontinued     20 mL Per Tube Daily 12/28/16 1226 12/30/16 1418   12/28/16 1000  amphotericin B liposome (AMBISOME) 240 mg in dextrose 5 % 500 mL IVPB  Status:  Discontinued     3.5 mg/kg  67.6 kg 250 mL/hr over 120 Minutes Intravenous Every 24 hours 12/28/16 0853 12/30/16 1510   12/28/16 0900  amphotericin B liposome (AMBISOME) 240 mg in dextrose 5 % 500 mL IVPB  Status:  Discontinued     3.5 mg/kg  67.6 kg 250 mL/hr over 120 Minutes Intravenous Every 24 hours 12/28/16 0832 12/28/16 0853   12/27/16 0000  flucytosine (ANCOBON) capsule 1,500 mg  Status:  Discontinued     1,500 mg Per Tube Every 6 hours 12/26/16 1909 01/12/17 0952   12/26/16 2200  sulfamethoxazole-trimethoprim (BACTRIM DS,SEPTRA DS) 800-160 MG per tablet 1 tablet  Status:  Discontinued     1 tablet Per Tube Every 12 hours 12/26/16 1909 12/26/16 1912   12/26/16 2200  valACYclovir (VALTREX) tablet 1,000 mg     1,000 mg Per Tube 2 times daily 12/26/16 1909 01/01/17 2359   12/26/16 2200   sulfamethoxazole-trimethoprim (BACTRIM,SEPTRA) 200-40 MG/5ML suspension 20 mL  Status:  Discontinued     20 mL Per Tube Every 12 hours 12/26/16 1912 12/28/16 1226   12/23/16 2200  sulfamethoxazole-trimethoprim (BACTRIM DS,SEPTRA DS) 800-160 MG per tablet 1 tablet  Status:  Discontinued     1 tablet Oral Every 12 hours 12/23/16 1822 12/26/16 1909   12/23/16 1900  cefTRIAXone (ROCEPHIN) 2 g in dextrose 5 % 50 mL IVPB  Status:  Discontinued     2 g 100 mL/hr over 30 Minutes Intravenous Every 24 hours 12/23/16 1821 12/28/16 1122   12/23/16 0900  amphotericin B liposome (AMBISOME) 200 mg in dextrose 5 % 500 mL IVPB  Status:  Discontinued     200 mg 250 mL/hr over 120 Minutes Intravenous Every 24 hours 12/22/16 0919 12/28/16 0832   12/23/16 0800  azithromycin (ZITHROMAX) tablet 1,200 mg  Status:  Discontinued     1,200 mg Oral Weekly 12/22/16 0811 12/26/16 2029   12/22/16 2200  valACYclovir (VALTREX) tablet 1,000 mg  Status:  Discontinued     1,000 mg  Oral 2 times daily 12/22/16 1611 12/26/16 1909   12/22/16 1800  amoxicillin-clavulanate (AUGMENTIN) 875-125 MG per tablet 1 tablet  Status:  Discontinued     1 tablet Oral 2 times daily with meals 12/22/16 1446 12/23/16 1821   12/22/16 1800  doxycycline (VIBRA-TABS) tablet 100 mg  Status:  Discontinued     100 mg Oral 2 times daily with meals 12/22/16 1446 12/23/16 1821   12/22/16 1200  flucytosine (ANCOBON) capsule 1,500 mg  Status:  Discontinued     1,500 mg Oral Every 6 hours 12/22/16 0614 12/26/16 1909   12/22/16 1000  azithromycin (ZITHROMAX) tablet 250 mg  Status:  Discontinued     250 mg Oral Daily 12/21/16 0224 12/21/16 1131   12/22/16 1000  doxycycline (VIBRA-TABS) tablet 100 mg  Status:  Discontinued     100 mg Oral Every 12 hours 12/22/16 0942 12/22/16 1446   12/22/16 0000  amphotericin B liposome (AMBISOME) 190 mg in dextrose 5 % 500 mL IVPB  Status:  Discontinued     3 mg/kg  64.2 kg 250 mL/hr over 120 Minutes Intravenous Every 24  hours 12/21/16 2313 12/22/16 0919   12/22/16 0000  flucytosine (ANCOBON) capsule 250 mg  Status:  Discontinued     250 mg Oral Every 6 hours 12/21/16 2314 12/21/16 2325   12/22/16 0000  flucytosine (ANCOBON) capsule 1,500 mg  Status:  Discontinued     1,500 mg Oral Every 6 hours 12/21/16 2326 12/22/16 0614   12/21/16 1645  cefTRIAXone (ROCEPHIN) 2 g in dextrose 5 % 50 mL IVPB  Status:  Discontinued     2 g 100 mL/hr over 30 Minutes Intravenous Every 24 hours 12/21/16 1539 12/22/16 1446   12/21/16 1645  metroNIDAZOLE (FLAGYL) IVPB 500 mg  Status:  Discontinued     500 mg 100 mL/hr over 60 Minutes Intravenous Every 8 hours 12/21/16 1539 12/21/16 2348   12/21/16 1600  cefTRIAXone (ROCEPHIN) 2 g in dextrose 5 % 50 mL IVPB  Status:  Discontinued     2 g 100 mL/hr over 30 Minutes Intravenous Every 24 hours 12/21/16 1131 12/21/16 1558   12/21/16 1400  vancomycin (VANCOCIN) IVPB 750 mg/150 ml premix  Status:  Discontinued     750 mg 150 mL/hr over 60 Minutes Intravenous Every 8 hours 12/21/16 0142 12/22/16 0942   12/21/16 1000  sulfamethoxazole-trimethoprim (BACTRIM DS,SEPTRA DS) 800-160 MG per tablet 1 tablet  Status:  Discontinued     1 tablet Oral Daily 12/21/16 0058 12/21/16 0100   12/21/16 1000  abacavir-dolutegravir-lamiVUDine (TRIUMEQ) 600-50-300 MG per tablet 1 tablet  Status:  Discontinued     1 tablet Oral Daily 12/21/16 0204 12/22/16 0903   12/21/16 1000  sulfamethoxazole-trimethoprim (BACTRIM DS,SEPTRA DS) 800-160 MG per tablet 1 tablet  Status:  Discontinued     1 tablet Oral Daily 12/21/16 0204 12/23/16 1822   12/21/16 0800  ceFEPIme (MAXIPIME) 2 g in dextrose 5 % 50 mL IVPB  Status:  Discontinued     2 g 100 mL/hr over 30 Minutes Intravenous Every 8 hours 12/21/16 0137 12/21/16 1131   12/21/16 0600  piperacillin-tazobactam (ZOSYN) IVPB 3.375 g  Status:  Discontinued     3.375 g 12.5 mL/hr over 240 Minutes Intravenous Every 8 hours 12/21/16 0058 12/21/16 0132   12/21/16 0200   metroNIDAZOLE (FLAGYL) IVPB 500 mg  Status:  Discontinued     500 mg 100 mL/hr over 60 Minutes Intravenous Every 8 hours 12/21/16 0137 12/22/16 1446   12/21/16  0145  ceFEPIme (MAXIPIME) 2 g in dextrose 5 % 50 mL IVPB     2 g 100 mL/hr over 30 Minutes Intravenous  Once 12/21/16 0137 12/21/16 0358   12/21/16 0145  vancomycin (VANCOCIN) IVPB 1000 mg/200 mL premix     1,000 mg 200 mL/hr over 60 Minutes Intravenous  Once 12/21/16 0142 12/21/16 0429   12/21/16 0130  piperacillin-tazobactam (ZOSYN) IVPB 3.375 g  Status:  Discontinued     3.375 g 100 mL/hr over 30 Minutes Intravenous  Once 12/21/16 0115 12/21/16 0132   12/21/16 0130  azithromycin (ZITHROMAX) powder 1 g     1 g Oral  Once 12/21/16 0125 12/21/16 0328      Subjective:   [name] seen and examined today.  Patient complains of rectal pain this morning. Denies current shortness of breath, chest pain, abdominal pain, nausea, vomiting. Denies headache dizziness, or visual changes  Objective:   Vitals:   01/28/17 0602 01/28/17 1531 01/28/17 2258 01/29/17 0628  BP: (!) 98/55 100/60 (!) 101/57 98/63  Pulse: 82 96 85 75  Resp: 17 16 17 17   Temp: 98.2 F (36.8 C) 98.3 F (36.8 C) 97.4 F (36.3 C) 98 F (36.7 C)  TempSrc:  Oral Axillary Oral  SpO2: 100% 100% 100% 99%  Weight:      Height:        Intake/Output Summary (Last 24 hours) at 01/29/17 1141 Last data filed at 01/29/17 2229  Gross per 24 hour  Intake             1220 ml  Output             2951 ml  Net            -1731 ml   Filed Weights   01/10/17 0900 01/11/17 0640 01/11/17 0905  Weight: 63 kg (138 lb 14.2 oz) 60 kg (132 lb 4.4 oz) 62 kg (136 lb 11 oz)   Exam  General: Well developed, thin, no apparent distress  HEENT: NCAT, mucous membranes moist.   Cardiovascular: S1 S2 auscultated, RRR, no murmurs  Respiratory: Clear to auscultation bilaterally with equal chest rise  Abdomen: Soft, nontender, nondistended, + bowel sounds  Extremities:  warm dry without cyanosis clubbing or edema  Neuro: AAOx3, nonfocal, soft spoken  Skin: Without rashes exudates or nodules, multiple tattoos.   Psych: Appropriate  Data Reviewed: I have personally reviewed following labs and imaging studies  CBC:  Recent Labs Lab 01/25/17 0809 01/26/17 0613 01/28/17 0344 01/29/17 0645  WBC 2.2* 2.2* 2.4* 3.0*  HGB 8.2* 8.0* 8.0* 7.6*  HCT 26.2* 25.4* 25.5* 24.7*  MCV 88.8 88.8 88.9 89.5  PLT 176 190 180 798   Basic Metabolic Panel:  Recent Labs Lab 01/25/17 0809 01/26/17 0613 01/27/17 0527 01/28/17 0344 01/29/17 0645 01/29/17 0646  NA 132* 132* 131* 132*  --  131*  K 4.3 4.6 4.9 4.4  --  4.5  CL 102 102 100* 103  --  101  CO2 24 25 25 23   --  24  GLUCOSE 94 82 84 87  --  85  BUN 13 13 15 19   --  20  CREATININE 0.97 0.97 0.99 1.05  --  1.07  CALCIUM 9.0 8.9 9.0 9.3  --  9.4  MG 1.5* 1.8 1.5* 1.6* 1.7  --   PHOS 4.0 5.3* 5.1* 4.7*  --  4.9*   GFR: Estimated Creatinine Clearance: 87.7 mL/min (by C-G formula based on SCr of 1.07 mg/dL).  Liver Function Tests:  Recent Labs Lab 01/25/17 0809 01/26/17 3875 01/27/17 0527 01/28/17 0344 01/29/17 0646  ALBUMIN 2.4* 2.5* 2.5* 2.5* 2.5*   No results for input(s): LIPASE, AMYLASE in the last 168 hours. No results for input(s): AMMONIA in the last 168 hours. Coagulation Profile: No results for input(s): INR, PROTIME in the last 168 hours. Cardiac Enzymes: No results for input(s): CKTOTAL, CKMB, CKMBINDEX, TROPONINI in the last 168 hours. BNP (last 3 results) No results for input(s): PROBNP in the last 8760 hours. HbA1C: No results for input(s): HGBA1C in the last 72 hours. CBG: No results for input(s): GLUCAP in the last 168 hours. Lipid Profile: No results for input(s): CHOL, HDL, LDLCALC, TRIG, CHOLHDL, LDLDIRECT in the last 72 hours. Thyroid Function Tests: No results for input(s): TSH, T4TOTAL, FREET4, T3FREE, THYROIDAB in the last 72 hours. Anemia Panel: No results for  input(s): VITAMINB12, FOLATE, FERRITIN, TIBC, IRON, RETICCTPCT in the last 72 hours. Urine analysis:    Component Value Date/Time   COLORURINE YELLOW 12/20/2016 0706   APPEARANCEUR CLEAR 12/20/2016 0706   LABSPEC 1.026 12/20/2016 0706   PHURINE 6.0 12/20/2016 0706   GLUCOSEU NEGATIVE 12/20/2016 0706   HGBUR NEGATIVE 12/20/2016 0706   BILIRUBINUR NEGATIVE 12/20/2016 0706   KETONESUR NEGATIVE 12/20/2016 0706   PROTEINUR 100 (A) 12/20/2016 0706   NITRITE NEGATIVE 12/20/2016 0706   LEUKOCYTESUR NEGATIVE 12/20/2016 0706   Sepsis Labs: @LABRCNTIP (procalcitonin:4,lacticidven:4)  ) Recent Results (from the past 240 hour(s))  CSF culture     Status: None   Collection Time: 01/20/17 11:38 AM  Result Value Ref Range Status   Specimen Description CSF  Final   Special Requests TUBE 2  Final   Gram Stain   Final    CYTOSPIN SMEAR WBC PRESENT, PREDOMINANTLY MONONUCLEAR ENCAPSULATED YEAST SEEN CRITICAL RESULT CALLED TO, READ BACK BY AND VERIFIED WITH: Ardelia Mems RN 13:40 01/20/17 ([email or web address])    Culture NO GROWTH 3 DAYS  Final   Report Status 01/23/2017 FINAL  Final  Fungus Culture With Stain     Status: None (Preliminary result)   Collection Time: 01/20/17 11:38 AM  Result Value Ref Range Status   Fungus Stain Final report  Final    Comment: (NOTE) Performed At: [facility] [place], [place] [id number] Lindon Romp MD [id number]    Fungus (Mycology) Culture PENDING  Incomplete   Fungal Source CSF  Final    Comment: TUBE 2  Fungus Culture Result     Status: None   Collection Time: 01/20/17 11:38 AM  Result Value Ref Range Status   Result 1 Comment  Final    Comment: (NOTE) KOH/Calcofluor preparation:  no fungus observed. Performed At: [facility] [place], [place] [id number] Lindon Romp MD [id number]   CSF culture     Status: None   Collection Time: 01/25/17  1:33 PM  Result Value Ref Range Status   Specimen  Description CSF  Final   Special Requests NONE  Final   Gram Stain   Final    WBC PRESENT, PREDOMINANTLY MONONUCLEAR YEAST CYTOSPIN SMEAR CRITICAL RESULT CALLED TO, READ BACK BY AND VERIFIED WITH: Sharlene Dory RN, AT [id number] 01/28/17 BY D. VANHOOK    Culture NO GROWTH 3 DAYS  Final   Report Status 01/29/2017 FINAL  Final  Fungus Culture With Stain     Status: None (Preliminary result)   Collection Time: 01/25/17  1:33 PM  Result Value Ref Range Status   Fungus Stain  Final report  Final    Comment: (NOTE) Performed At: [facility] [place], [place] [id number] Lindon Romp MD [id number]    Fungus (Mycology) Culture PENDING  Incomplete   Fungal Source CSF  Final  Fungus Culture Result     Status: None   Collection Time: 01/25/17  1:33 PM  Result Value Ref Range Status   Result 1 Comment  Final    Comment: (NOTE) KOH/Calcofluor preparation:  no fungus observed. Performed At: [facility] [address] [place], [place] [id number] Lindon Romp MD OP:[id number]       Radiology Studies: No results found.   Scheduled Meds: . amantadine  100 mg Oral BID WC  . azithromycin  1,200 mg Oral Weekly  . feeding supplement (ENSURE ENLIVE)  237 mL Oral BID BM  . fluconazole  400 mg Oral Daily  . lidocaine (PF)  5 mL Intradermal Once  . magnesium oxide  400 mg Oral BID  . mouth rinse  15 mL Mouth Rinse BID  . multivitamin with minerals  1 tablet Oral Daily  . senna-docusate  2 tablet Oral BID  . sodium chloride flush  3 mL Intravenous Q12H  . sulfamethoxazole-trimethoprim  1 tablet Oral Daily  . tamsulosin  0.4 mg Oral Daily   Continuous Infusions: . sodium chloride 50 mL/hr at 01/28/17 2010     LOS: 39 days   Time Spent in minutes   30  minutes  Trey Bebee D.O. on 01/29/2017 at 11:41 AM  Between 7am to 7pm - Pager - [phone]  After 7pm go to [email or web address] - password [email or web address]  And look for the night coverage person covering for me  after hours  [facility] Office  [phone]

## 2017-01-30 ENCOUNTER — Inpatient Hospital Stay (HOSPITAL_COMMUNITY): Payer: Medicaid Other

## 2017-01-30 DIAGNOSIS — R6510 Systemic inflammatory response syndrome (SIRS) of non-infectious origin without acute organ dysfunction: Secondary | ICD-10-CM

## 2017-01-30 DIAGNOSIS — R291 Meningismus: Secondary | ICD-10-CM

## 2017-01-30 LAB — CRYPTOCOCCAL ANTIGEN, CSF
Crypto Ag: POSITIVE — AB
Cryptococcal Ag Titer: 160 — AB

## 2017-01-30 LAB — CSF CELL COUNT WITH DIFFERENTIAL
LYMPHS CSF: 96 % — AB (ref 40–80)
MONOCYTE-MACROPHAGE-SPINAL FLUID: 4 % — AB (ref 15–45)
RBC Count, CSF: 56 /mm3 — ABNORMAL HIGH
Tube #: 1
WBC, CSF: 12 /mm3 (ref 0–5)

## 2017-01-30 LAB — RENAL FUNCTION PANEL
ALBUMIN: 2.6 g/dL — AB (ref 3.5–5.0)
ANION GAP: 6 (ref 5–15)
BUN: 21 mg/dL — ABNORMAL HIGH (ref 6–20)
CALCIUM: 9.2 mg/dL (ref 8.9–10.3)
CO2: 22 mmol/L (ref 22–32)
CREATININE: 1.03 mg/dL (ref 0.61–1.24)
Chloride: 106 mmol/L (ref 101–111)
GFR calc Af Amer: >60 mL/min (ref >60)
GFR calc non Af Amer: >60 mL/min (ref >60)
GLUCOSE: 81 mg/dL (ref 65–99)
PHOSPHORUS: 5.2 mg/dL — AB (ref 2.5–4.6)
Potassium: 4.5 mmol/L (ref 3.5–5.1)
SODIUM: 134 mmol/L — AB (ref 135–145)

## 2017-01-30 LAB — CBC
HCT: 25.3 % — ABNORMAL LOW (ref 39.0–52.0)
HEMOGLOBIN: 7.9 g/dL — AB (ref 13.0–17.0)
MCH: 28.4 pg (ref 26.0–34.0)
MCHC: 31.2 g/dL (ref 30.0–36.0)
MCV: 91 fL (ref 78.0–100.0)
Platelets: 161 10*3/uL (ref 150–400)
RBC: 2.78 MIL/uL — AB (ref 4.22–5.81)
RDW: 17.4 % — ABNORMAL HIGH (ref 11.5–15.5)
WBC: 2.9 10*3/uL — ABNORMAL LOW (ref 4.0–10.5)

## 2017-01-30 LAB — MAGNESIUM: Magnesium: 1.5 mg/dL — ABNORMAL LOW (ref 1.7–2.4)

## 2017-01-30 MED ORDER — LIDOCAINE HCL (PF) 1 % IJ SOLN
5.0000 mL | Freq: Once | INTRAMUSCULAR | Status: AC
  Administered 2017-01-30: 2 mL via INTRADERMAL
  Filled 2017-01-30: qty 5

## 2017-01-30 MED ORDER — MAGNESIUM SULFATE 4 GM/100ML IV SOLN
4.0000 g | Freq: Once | INTRAVENOUS | Status: AC
  Administered 2017-01-30: 4 g via INTRAVENOUS
  Filled 2017-01-30: qty 100

## 2017-01-30 MED ORDER — LIDOCAINE HCL (PF) 1 % IJ SOLN
INTRAMUSCULAR | Status: AC
  Administered 2017-01-30: 2 mL via INTRADERMAL
  Filled 2017-01-30: qty 5

## 2017-01-30 MED ORDER — MAGNESIUM SULFATE 2 GM/50ML IV SOLN: 2.0000 g | Freq: Once | INTRAVENOUS | Status: DC

## 2017-01-30 NOTE — Progress Notes (Signed)
Physical Therapy Treatment Patient Details Name: William Pena MRN: [id number] DOB: Jun 11, 1986 Today's Date: 01/30/2017    History of Present Illness Patient is a 31 y/o male presents with sepsis secondary to perirectal abscess s/p I&D 5/23 and disseminated cryptococcal infection including meningitis, bacteremia, and likely pneumonia. Intubated 5/28-6/5. Pt found to have acute ischemic strokes. 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 Second MRI- extension of the diffusion restriction in the left basal ganglia and right basal ganglia. s/p Lumbar drain 5/30. PMH includes advanced hiv disease    PT Comments    Patient is making gradual progress toward mobility goals. Pt continues to require max A +2 for mobility. Worked on sit to stand transfers with use of Stedy today. Continue to progress as tolerated.   Follow Up Recommendations  SNF     Equipment Recommendations  Other (comment)    Recommendations for Other Services Rehab consult     Precautions / Restrictions Precautions Precautions: Fall    Mobility  Bed Mobility Overal bed mobility: Needs Assistance Bed Mobility: Rolling;Sidelying to Sit Rolling: Max assist Sidelying to sit: +2 for safety/equipment;Max assist       General bed mobility comments: cues for sequencing and use of bed rails; assist at bilat LE and trunk; pt with rigid UE movements when transitioning into sitting and required hand over hand assist for hand positioning for assist  Transfers Overall transfer level: Needs assistance   Transfers: Sit to/from Stand Sit to Stand: +2 physical assistance;Max assist         General transfer comment: vc for sequencing/technique and multimodal cues for posture; X2 from EOB; first trial unable to achieve full standing with side by side assist and use of bed pad; second trial able to achieve more upright standing with therapist in front and therapist behind  pt assisting to lift hips from EOB  Ambulation/Gait                 Stairs            Wheelchair Mobility    Modified Rankin (Stroke Patients Only)       Balance Overall balance assessment: Needs assistance Sitting-balance support: Feet supported Sitting balance-Leahy Scale: Poor Sitting balance - Comments: max assist, R lean, min assist with hands on rail of stedy     Standing balance-Leahy Scale: Zero Standing balance comment: Pt with increased hip flexion, unable to achieve upright                            Cognition Arousal/Alertness: Awake/alert Behavior During Therapy: Flat affect Overall Cognitive Status: Impaired/Different from baseline Area of Impairment: Attention;Following commands;Problem solving                   Current Attention Level: Sustained Memory: Decreased short-term memory Following Commands: Follows one step commands inconsistently;Follows one step commands with increased time     Problem Solving: Slow processing;Decreased initiation;Difficulty sequencing;Requires verbal cues;Requires tactile cues General Comments: pt with inconsistencies in response speed      Exercises General Exercises - Lower Extremity Long Arc Quad: AROM;Left;5 reps;Right;Limitations Long [employer] Limitations: assist required on R side; quad activation noted however unable to extend LE    General Comments        Pertinent Vitals/Pain Pain Assessment: Faces Faces Pain Scale: Hurts even more Pain Location: buttocks Pain Descriptors / Indicators: Discomfort;Grimacing;Sore Pain Intervention(s): Monitored during session;Repositioned    Home Living  Prior Function            PT Goals (current goals can now be found in the care plan section) Acute Rehab PT Goals Patient Stated Goal: did not state Progress towards PT goals: Progressing toward goals    Frequency    Min 3X/week      PT Plan  Current plan remains appropriate    Co-evaluation PT/OT/SLP Co-Evaluation/Treatment: Yes Reason for Co-Treatment: For patient/therapist safety PT goals addressed during session: Mobility/safety with mobility;Balance OT goals addressed during session: Strengthening/ROM;ADL's and self-care      AM-PAC PT "6 Clicks" Daily Activity  Outcome Measure  Difficulty turning over in bed (including adjusting bedclothes, sheets and blankets)?: Total Difficulty moving from lying on back to sitting on the side of the bed? : Total Difficulty sitting down on and standing up from a chair with arms (e.g., wheelchair, bedside commode, etc,.)?: Total Help needed moving to and from a bed to chair (including a wheelchair)?: Total Help needed walking in hospital room?: Total Help needed climbing 3-5 steps with a railing? : Total 6 Click Score: 6    End of Session Equipment Utilized During Treatment: Gait belt Activity Tolerance: Patient tolerated treatment well Patient left: in chair;with call bell/phone within reach;with chair alarm set Nurse Communication: Mobility status;Need for lift equipment PT Visit Diagnosis: Muscle weakness (generalized) (M62.81);Other abnormalities of gait and mobility (R26.89);Hemiplegia and hemiparesis Hemiplegia - Right/Left: Left Hemiplegia - dominant/non-dominant: Non-dominant Hemiplegia - caused by: Cerebral infarction     Time: 1110-1147 PT Time Calculation (min) (ACUTE ONLY): 37 min  Charges:  $Therapeutic Activity: 8-22 mins                    G Codes:       Earney Navy, PTA Pager: [phone]     Darliss Cheney 01/30/2017, 3:59 PM

## 2017-01-30 NOTE — Progress Notes (Signed)
Patient ID: William Pena, male   DOB: 11/24/1985, 31 y.o.   MRN: [id number]          [facility] for Infectious Disease    Date of Admission:  12/20/2016   Total days of cryptococcal therapy 40        Day 12 fluconazole  I was not able to examine [name] today as he was out of the room for his lumbar puncture but I did have the opportunity to discuss his care with Dr. Ree Kida. She told me that [name] is more alert and interactive than when I last saw him 2 weeks ago. The opening pressure on his lumbar puncture today was down to 8 cm of water. His recent CSF cultures have been negative and other CSF parameters are improving. I will continue induction fluconazole for now. He should not need further lumbar punctures as long as his mental status is stable to improving. I will consider starting antiretroviral therapy in the near future.         William Bickers, MD [facility] [phone] pager   [phone] cell 01/30/2017, 4:41 PM

## 2017-01-30 NOTE — Progress Notes (Signed)
SLP Cancellation Note  Patient Details Name: William Pena MRN: [id number] DOB: 1986/05/20   Cancelled treatment:       Reason Eval/Treat Not Completed: Patient at procedure or test/unavailable. Will reattempt if time allows.   Rutledge, [place], CCC-SLP 01/30/2017, 2:42 PM [phone]

## 2017-01-30 NOTE — Procedures (Signed)
Non complicated lumbar puncture performed at L2-3.  Opening pressure 9cm csf.  3.5 cc obtained.  Please see full report in pacs.

## 2017-01-30 NOTE — Progress Notes (Signed)
Pts mother, Claiborne Stroble called. Password verbalized, information given about pt resting, no distress noted. LP results to be discussed with provider in am.

## 2017-01-30 NOTE — Progress Notes (Signed)
Occupational Therapy Treatment Patient Details Name: William Pena MRN: [id number] DOB: 07/11/1986 Today's Date: 01/30/2017    History of present illness Patient is a 31 y/o male presents with sepsis secondary to perirectal abscess s/p I&D 5/23 and disseminated cryptococcal infection including meningitis, bacteremia, and likely pneumonia. Intubated 5/28-6/5. Pt found to have acute ischemic strokes. 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 Second MRI- extension of the diffusion restriction in the left basal ganglia and right basal ganglia. s/p Lumbar drain 5/30. PMH includes advanced hiv disease   OT comments  Pt progressing slowly. Required +2 max assist for all mobility today. Attempted sit to stand using sara stedy, pt able to assist by pulling up with B UEs, but only able to achieve a partial stand. Inconsistent ability to follow commands and with response speed. Goals updated. Will follow.  Follow Up Recommendations  SNF    Equipment Recommendations  None recommended by OT    Recommendations for Other Services      Precautions / Restrictions Precautions Precautions: Fall       Mobility Bed Mobility Overal bed mobility: Needs Assistance Bed Mobility: Rolling;Sidelying to Sit Rolling: Max assist Sidelying to sit: +2 for safety/equipment;Max assist       General bed mobility comments: pt needing step by step cues for sequencing and assist for all aspects  Transfers Overall transfer level: Needs assistance   Transfers: Sit to/from Stand Sit to Stand: +2 physical assistance;Max assist         General transfer comment: assist of one therapist in front and second in back to rise enough to manipulate pads behind buttocks    Balance Overall balance assessment: Needs assistance Sitting-balance support: Feet supported Sitting balance-Leahy Scale: Poor Sitting balance - Comments: max assist, L lean, min assist with  hands on rail of stedy     Standing balance-Leahy Scale: Zero Standing balance comment: Pt with increased hip flexion, unable to achieve upright                           ADL either performed or assessed with clinical judgement   ADL Overall ADL's : Needs assistance/impaired Eating/Feeding: Set up;Sitting Eating/Feeding Details (indicate cue type and reason): drinking ensure with straw Grooming: Wash/dry face;Sitting;Moderate assistance Grooming Details (indicate cue type and reason): decreased effort/task persistence                             Functional mobility during ADLs: Total assistance;+2 for physical assistance;+2 for safety/equipment General ADL Comments: Goal of session was to increase ability to stand using sara stedy/     Vision       Perception     Praxis      Cognition Arousal/Alertness: Awake/alert Behavior During Therapy: Flat affect Overall Cognitive Status: Impaired/Different from baseline Area of Impairment: Attention;Following commands;Problem solving                   Current Attention Level: Sustained Memory: Decreased short-term memory Following Commands: Follows one step commands inconsistently;Follows one step commands with increased time     Problem Solving: Slow processing;Decreased initiation;Difficulty sequencing;Requires verbal cues;Requires tactile cues General Comments: pt with inconsistencies in response speed        Exercises     Shoulder Instructions       General Comments      Pertinent Vitals/ Pain       Pain  Assessment: Faces Faces Pain Scale: Hurts even more Pain Location: buttocks Pain Descriptors / Indicators: Discomfort;Grimacing;Sore Pain Intervention(s): Repositioned;Monitored during session  Home Living                                          Prior Functioning/Environment              Frequency  Min 3X/week        Progress Toward Goals  OT  Goals(current goals can now be found in the care plan section)  Progress towards OT goals: Not progressing toward goals - comment  Acute Rehab OT Goals Patient Stated Goal: did not state OT Goal Formulation: With patient Time For Goal Achievement: 02/13/17 Potential to Achieve Goals: [place] Discharge plan remains appropriate    Co-evaluation    PT/OT/SLP Co-Evaluation/Treatment: Yes Reason for Co-Treatment: For patient/therapist safety   OT goals addressed during session: Strengthening/ROM;ADL's and self-care      AM-PAC PT "6 Clicks" Daily Activity     Outcome Measure   Help from another person eating meals?: A Lot Help from another person taking care of personal grooming?: A Lot Help from another person toileting, which includes using toliet, bedpan, or urinal?: Total Help from another person bathing (including washing, rinsing, drying)?: Total Help from another person to put on and taking off regular upper body clothing?: Total Help from another person to put on and taking off regular lower body clothing?: Total 6 Click Score: 8    End of Session    OT Visit Diagnosis: Cognitive communication deficit (R41.841);Apraxia (R48.2);Muscle weakness (generalized) (M62.81) Symptoms and signs involving cognitive functions: Cerebral infarction   Activity Tolerance Patient tolerated treatment well   Patient Left in chair;with call bell/phone within reach;with chair alarm set   Nurse Communication Need for lift equipment        Time: 1110-1146 OT Time Calculation (min): 36 min  Charges: OT General Charges $OT Visit: 1 Procedure OT Treatments $Therapeutic Activity: 8-22 mins     Malka So 01/30/2017, 1:18 PM  [phone]

## 2017-01-30 NOTE — Progress Notes (Signed)
PROGRESS NOTE    William Pena  HYI:[id number] DOB: 1985/09/07 DOA: 12/20/2016 PCP: [name], MD   Chief Complaint  Patient presents with  . Rectal Bleeding  . Generalized Body Aches    Brief Narrative:  HPI on 12/20/2016 by Dr. Stark Klein (surgery) Pt is a 31 yo M with history of recurrent perirectal abscesses.  He was treated at [facility] with surgery in January by Dr. Ronita Hipps, but now is incarcerated in [place].  He has had multiple different types of drains, but currently just has a draining seton in place at the site of a fistula.  He has been to the ED 3 times in the last week.  Due to his current incarceration in [facility], he cannot be transferred to [place].  He reports severe rectal pain.  He has had fever to 103 and had sepsis workup this AM at [facility].  He did have a CT on 5/19 that showed small abscess and antibiotics were recommended.  He was not able to be compliant with that for unclear reasons.  He has also had abdominal pain. [name] also seen at [facility] for his AIDS by Dr. Tobie Poet.  [name] frequently homeless as well.  [name] reportedly on his HIV meds.  Last CD 4 count looks like 1 /microliter (10/04/2016) and last viral load looks like 50,936/mL (08/08/2016).  Interim history During admission he was also noted to have headache, fever, chills, night sweats, neck pain and lymphadenopathy. ID was consulted and recommended imaging of chest, LP and cultures be sent from perirectal I&D. Also his serum cryptococcal antigen titer was elevated raising the possibility of disseminated cryptococcal infection including lung involvement. Pulmonology was consulted by ID for bronchoscopy and washings in an effort to help determine if his CT changes reflect TB or possible pulmonary cryptococcal disease. Initially unable to perform bronchoscopy due to hypoxia. 5/28 patient had altered LOC, became very lethargic/obtunded requiring CT and LP, complicated by acute respiratory failure due to  altered mental status that required mechanical ventilation. He had a lumbar CSF drain to depression arise. Now extubated, drain removed. Failed swallowing evaluation and has Cor trak forte tube feeds. Mental status slightly better but waxing and waning. ID, neurology, neurosurgery and CCM consulted during hospital course. Care transferred to stepdown and [place] on 01/09/17. MS slowly improving. Cor Trak discontinued 6/12 and diet initiated. Transferred to medical bed 6/13.Acute kidney injury on 6/14 (creatinine 2.8), likely due to acute urinary retention & Amphotericin> resolved. 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 Lumbar puncture done on 01/20/2017 showed opening pressure of 31, better than before, it was 55 -CSF culture showed yeast -Discussed with infectious disease, Dr. Baxter Flattery. Patient will need repeat lumbar puncture as he did have an elevated opening pressure on previous lumbar puncture. -Interventional radiology consult appreciated, status post lumbar puncture on 01/25/2017. Opening pressure 22. CSF obtained and sent to the lab, results pending. -Discussed with Dr. Baxter Flattery, since patient is currently asymptomatic and alert, will defer VP shunt for now -Patient was seen by Dr. Drucilla Schmidt, who recommended repeat LP with IR and for opening/closing pressures to be obtained, as much fluid to be removed as possible. CSF to be  sent for cell count with diff, culture, crypto Ag  Acute urinary retention -Continue Foley catheter and Flomax -Patient failed voiding trial twice  Acute kidney injury -Likely secondary to  urinary retention and possibly amphotericin -Creatinine peaked to 2.84, currently 1.03 -Continue to monitor BMP  Acute hypoxic respiratory failure secondary cryptococcal pneumonia -Required intubation, was extubated on 01/03/2017 -Patient currently on room air  Acute encephalopathy -Likely multi-factorial including cryptococcal meningitis, acute strokes, metabolic arrangements, medication versus respiratory etiology -Has been improving slowly.  -Per mother at bedside, patient's baseline is normally very active   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 -Neurology consulted and appreciated -No role for statins or antiplatelets in the setting continue antifungal treatment  HIV/AIDS -Infectious disease following -CD4 count on admission 21, high viral load greater than 1 million -Continue prophylaxis with Bactrim and azithromycin -ART not initiated due to high risk of IR IS, OD initiated in 1-2 weeks per ID  Perirectal abscess with pain -Patient initially admitted by general surgery, status post IND -Continue dressing changes twice a day -Continue wound care -Continue pain control with both IV and oral formulations  Genital herpes -Continue valtrex 1g BID (started on 6/28) for 7 days then needs chronic suppression   Hypomagnesemia -Magnesium 1.5 today -Will give additional IV dose (4g) and continue oral supplementation.  -Do not want to increase oral supplementation as this may cause patient to have diarrhea -Continue to monitor   Diarrhea -Stool culture negative, resolved  Anemia of chronic disease -Hemoglobin 7.9 this morning (has been around 8 for the past several days, however dropped to 7.6 on 01/29/17) -If hemoglobin continues to drop will consider transfusion -Continue to monitor CBC  Hypertension -Continue IV hydralazine as needed  Thrombocytopenia -Unknown etiology, possibly medication effect -Currently stable,  no reports of bleeding -Platelets 161 -Continue to monitor CBC  Facial swelling -Appears to have resolved, likely related to hypoalbuminemia  Constipation -Continue bowel regimen  Physical deconditioning -Compensated by patient's comorbidities as well as prolonged hospitalization.  -Physical therapy assessed the patient, recommending SNF -SW consulted for placement, however, difficult to place at this time -Speech therapy assessed patient, recommended regular diet, thin liquids  Severe malnutrition  -in the context of chronic illness -nutrition consulted -continue feeding supplementation  DVT Prophylaxis  SCDs  Code Status: Full  Family Communication: Mother at bedside  Disposition Plan: Admitted. Dispo to SNF when stable.   Consultants PCCM Infectious disease Interventional radiology General surgery Neurology Neurosurgery  Procedures  Lumbar puncture Lumbar drain placement on 5/28, discontinued 6/10 Intubation/extubation Cor Trak discontinued 01/10/2017 Lumbar puncture by interventional radiology on 01/25/2017  Antibiotics   Anti-infectives    Start     Dose/Rate Route Frequency Ordered Stop   01/26/17 1545  valACYclovir (VALTREX) tablet 1,000 mg     1,000 mg Oral 2 times daily 01/26/17 1535 01/29/17 0816   01/24/17 1100  fluconazole (DIFLUCAN) tablet 400 mg     400 mg Oral Daily 01/24/17 1059     01/19/17 1530  fluconazole (DIFLUCAN) IVPB 400 mg  Status:  Discontinued     400 mg 100 mL/hr over 120 Minutes Intravenous Every 24 hours 01/19/17 1442 01/24/17 1059   01/16/17 2200  sulfamethoxazole-trimethoprim (BACTRIM DS,SEPTRA DS) 800-160 MG per tablet 1 tablet     1 tablet Oral Daily 01/16/17 2035     01/13/17 1000  sulfamethoxazole-trimethoprim (BACTRIM,SEPTRA) 200-40 MG/5ML suspension 10 mL  Status:  Discontinued     10 mL Oral Daily 01/12/17  6378 01/12/17 1149   01/12/17 1300  amphotericin B liposome (AMBISOME) 240 mg in dextrose 5 % 500 mL IVPB  Status:   Discontinued     240 mg 250 mL/hr over 120 Minutes Intravenous Every 24 hours 01/12/17 1155 01/12/17 1203   01/12/17 1300  amphotericin B liposome (AMBISOME) 240 mg in dextrose 5 % 500 mL IVPB  Status:  Discontinued     240 mg 250 mL/hr over 120 Minutes Intravenous Every 24 hours 01/12/17 1203 01/19/17 1442   01/12/17 1200  flucytosine (ANCOBON) capsule 1,500 mg  Status:  Discontinued     1,500 mg Oral Every 6 hours 01/12/17 0952 01/19/17 1442   01/06/17 1000  azithromycin (ZITHROMAX) tablet 1,200 mg     1,200 mg Oral Weekly 01/04/17 2339     01/06/17 1000  amphotericin B liposome (AMBISOME) 240 mg in dextrose 5 % 500 mL IVPB  Status:  Discontinued     4 mg/kg  59.8 kg 250 mL/hr over 120 Minutes Intravenous Every 24 hours 01/06/17 0815 01/12/17 1155   12/31/16 1000  sulfamethoxazole-trimethoprim (BACTRIM,SEPTRA) 200-40 MG/5ML suspension 10 mL  Status:  Discontinued     10 mL Per Tube Daily 12/30/16 1418 01/12/17 0952   12/31/16 1000  amphotericin B liposome (AMBISOME) 270 mg in dextrose 5 % 500 mL IVPB  Status:  Discontinued     4 mg/kg  67.6 kg 250 mL/hr over 120 Minutes Intravenous Every 24 hours 12/30/16 1510 01/06/17 0815   12/30/16 1000  azithromycin (ZITHROMAX) 200 MG/5ML suspension 1,200 mg  Status:  Discontinued     1,200 mg Per Tube Every Fri 12/26/16 2030 01/04/17 2339   12/29/16 1000  sulfamethoxazole-trimethoprim (BACTRIM,SEPTRA) 200-40 MG/5ML suspension 20 mL  Status:  Discontinued     20 mL Per Tube Daily 12/28/16 1226 12/30/16 1418   12/28/16 1000  amphotericin B liposome (AMBISOME) 240 mg in dextrose 5 % 500 mL IVPB  Status:  Discontinued     3.5 mg/kg  67.6 kg 250 mL/hr over 120 Minutes Intravenous Every 24 hours 12/28/16 0853 12/30/16 1510   12/28/16 0900  amphotericin B liposome (AMBISOME) 240 mg in dextrose 5 % 500 mL IVPB  Status:  Discontinued     3.5 mg/kg  67.6 kg 250 mL/hr over 120 Minutes Intravenous Every 24 hours 12/28/16 0832 12/28/16 0853   12/27/16 0000   flucytosine (ANCOBON) capsule 1,500 mg  Status:  Discontinued     1,500 mg Per Tube Every 6 hours 12/26/16 1909 01/12/17 0952   12/26/16 2200  sulfamethoxazole-trimethoprim (BACTRIM DS,SEPTRA DS) 800-160 MG per tablet 1 tablet  Status:  Discontinued     1 tablet Per Tube Every 12 hours 12/26/16 1909 12/26/16 1912   12/26/16 2200  valACYclovir (VALTREX) tablet 1,000 mg     1,000 mg Per Tube 2 times daily 12/26/16 1909 01/01/17 2359   12/26/16 2200  sulfamethoxazole-trimethoprim (BACTRIM,SEPTRA) 200-40 MG/5ML suspension 20 mL  Status:  Discontinued     20 mL Per Tube Every 12 hours 12/26/16 1912 12/28/16 1226   12/23/16 2200  sulfamethoxazole-trimethoprim (BACTRIM DS,SEPTRA DS) 800-160 MG per tablet 1 tablet  Status:  Discontinued     1 tablet Oral Every 12 hours 12/23/16 1822 12/26/16 1909   12/23/16 1900  cefTRIAXone (ROCEPHIN) 2 g in dextrose 5 % 50 mL IVPB  Status:  Discontinued     2 g 100 mL/hr over 30 Minutes Intravenous Every 24 hours 12/23/16 1821 12/28/16 1122   12/23/16 0900  amphotericin B liposome (  AMBISOME) 200 mg in dextrose 5 % 500 mL IVPB  Status:  Discontinued     200 mg 250 mL/hr over 120 Minutes Intravenous Every 24 hours 12/22/16 0919 12/28/16 0832   12/23/16 0800  azithromycin (ZITHROMAX) tablet 1,200 mg  Status:  Discontinued     1,200 mg Oral Weekly 12/22/16 0811 12/26/16 2029   12/22/16 2200  valACYclovir (VALTREX) tablet 1,000 mg  Status:  Discontinued     1,000 mg Oral 2 times daily 12/22/16 1611 12/26/16 1909   12/22/16 1800  amoxicillin-clavulanate (AUGMENTIN) 875-125 MG per tablet 1 tablet  Status:  Discontinued     1 tablet Oral 2 times daily with meals 12/22/16 1446 12/23/16 1821   12/22/16 1800  doxycycline (VIBRA-TABS) tablet 100 mg  Status:  Discontinued     100 mg Oral 2 times daily with meals 12/22/16 1446 12/23/16 1821   12/22/16 1200  flucytosine (ANCOBON) capsule 1,500 mg  Status:  Discontinued     1,500 mg Oral Every 6 hours 12/22/16 0614 12/26/16 1909     12/22/16 1000  azithromycin (ZITHROMAX) tablet 250 mg  Status:  Discontinued     250 mg Oral Daily 12/21/16 0224 12/21/16 1131   12/22/16 1000  doxycycline (VIBRA-TABS) tablet 100 mg  Status:  Discontinued     100 mg Oral Every 12 hours 12/22/16 0942 12/22/16 1446   12/22/16 0000  amphotericin B liposome (AMBISOME) 190 mg in dextrose 5 % 500 mL IVPB  Status:  Discontinued     3 mg/kg  64.2 kg 250 mL/hr over 120 Minutes Intravenous Every 24 hours 12/21/16 2313 12/22/16 0919   12/22/16 0000  flucytosine (ANCOBON) capsule 250 mg  Status:  Discontinued     250 mg Oral Every 6 hours 12/21/16 2314 12/21/16 2325   12/22/16 0000  flucytosine (ANCOBON) capsule 1,500 mg  Status:  Discontinued     1,500 mg Oral Every 6 hours 12/21/16 2326 12/22/16 0614   12/21/16 1645  cefTRIAXone (ROCEPHIN) 2 g in dextrose 5 % 50 mL IVPB  Status:  Discontinued     2 g 100 mL/hr over 30 Minutes Intravenous Every 24 hours 12/21/16 1539 12/22/16 1446   12/21/16 1645  metroNIDAZOLE (FLAGYL) IVPB 500 mg  Status:  Discontinued     500 mg 100 mL/hr over 60 Minutes Intravenous Every 8 hours 12/21/16 1539 12/21/16 2348   12/21/16 1600  cefTRIAXone (ROCEPHIN) 2 g in dextrose 5 % 50 mL IVPB  Status:  Discontinued     2 g 100 mL/hr over 30 Minutes Intravenous Every 24 hours 12/21/16 1131 12/21/16 1558   12/21/16 1400  vancomycin (VANCOCIN) IVPB 750 mg/150 ml premix  Status:  Discontinued     750 mg 150 mL/hr over 60 Minutes Intravenous Every 8 hours 12/21/16 0142 12/22/16 0942   12/21/16 1000  sulfamethoxazole-trimethoprim (BACTRIM DS,SEPTRA DS) 800-160 MG per tablet 1 tablet  Status:  Discontinued     1 tablet Oral Daily 12/21/16 0058 12/21/16 0100   12/21/16 1000  abacavir-dolutegravir-lamiVUDine (TRIUMEQ) 600-50-300 MG per tablet 1 tablet  Status:  Discontinued     1 tablet Oral Daily 12/21/16 0204 12/22/16 0903   12/21/16 1000  sulfamethoxazole-trimethoprim (BACTRIM DS,SEPTRA DS) 800-160 MG per tablet 1 tablet  Status:   Discontinued     1 tablet Oral Daily 12/21/16 0204 12/23/16 1822   12/21/16 0800  ceFEPIme (MAXIPIME) 2 g in dextrose 5 % 50 mL IVPB  Status:  Discontinued     2 g 100 mL/hr  over 30 Minutes Intravenous Every 8 hours 12/21/16 0137 12/21/16 1131   12/21/16 0600  piperacillin-tazobactam (ZOSYN) IVPB 3.375 g  Status:  Discontinued     3.375 g 12.5 mL/hr over 240 Minutes Intravenous Every 8 hours 12/21/16 0058 12/21/16 0132   12/21/16 0200  metroNIDAZOLE (FLAGYL) IVPB 500 mg  Status:  Discontinued     500 mg 100 mL/hr over 60 Minutes Intravenous Every 8 hours 12/21/16 0137 12/22/16 1446   12/21/16 0145  ceFEPIme (MAXIPIME) 2 g in dextrose 5 % 50 mL IVPB     2 g 100 mL/hr over 30 Minutes Intravenous  Once 12/21/16 0137 12/21/16 0358   12/21/16 0145  vancomycin (VANCOCIN) IVPB 1000 mg/200 mL premix     1,000 mg 200 mL/hr over 60 Minutes Intravenous  Once 12/21/16 0142 12/21/16 0429   12/21/16 0130  piperacillin-tazobactam (ZOSYN) IVPB 3.375 g  Status:  Discontinued     3.375 g 100 mL/hr over 30 Minutes Intravenous  Once 12/21/16 0115 12/21/16 0132   12/21/16 0130  azithromycin (ZITHROMAX) powder 1 g     1 g Oral  Once 12/21/16 0125 12/21/16 0328      Subjective:   [name] seen and examined today.  Patient currently sleeping. No complaints. Feeling mildly better, no headaches.   Objective:   Vitals:   01/29/17 0628 01/29/17 1319 01/29/17 2312 01/30/17 0545  BP: 98/63 98/63 105/62 105/64  Pulse: 75 83 89 80  Resp: 17 16 18 18   Temp: 98 F (36.7 C) 98.4 F (36.9 C) 98 F (36.7 C) 97.4 F (36.3 C)  TempSrc: Oral Oral Oral Oral  SpO2: 99% 100% 100% 100%  Weight:      Height:        Intake/Output Summary (Last 24 hours) at 01/30/17 0948 Last data filed at 01/30/17 0545  Gross per 24 hour  Intake                0 ml  Output             2050 ml  Net            -2050 ml   Filed Weights   01/10/17 0900 01/11/17 0640 01/11/17 0905  Weight: 63 kg (138 lb 14.2 oz) 60  kg (132 lb 4.4 oz) 62 kg (136 lb 11 oz)   Exam  General: Well developed, well nourished, NAD, appears stated age  HEENT: NCAT, mucous membranes moist.   Cardiovascular: S1 S2 auscultated, no murmurs, RRR  Respiratory: Clear to auscultation bilaterally with equal chest rise, no wheezing   Abdomen: Soft, nontender, nondistended, + bowel sounds  Extremities: warm dry without cyanosis clubbing or edema  Skin: multiple tattoos, no rashes,dry  Data Reviewed: I have personally reviewed following labs and imaging studies  CBC:  Recent Labs Lab 01/25/17 0809 01/26/17 0613 01/28/17 0344 01/29/17 0645 01/29/17 1243 01/30/17 0603  WBC 2.2* 2.2* 2.4* 3.0*  --  2.9*  HGB 8.2* 8.0* 8.0* 7.6* 7.6* 7.9*  HCT 26.2* 25.4* 25.5* 24.7* 23.8* 25.3*  MCV 88.8 88.8 88.9 89.5  --  91.0  PLT 176 190 180 174  --  683   Basic Metabolic Panel:  Recent Labs Lab 01/26/17 0613 01/27/17 0527 01/28/17 0344 01/29/17 0645 01/29/17 0646 01/30/17 0603  NA 132* 131* 132*  --  131* 134*  K 4.6 4.9 4.4  --  4.5 4.5  CL 102 100* 103  --  101 106  CO2 25 25 23   --  24 22  GLUCOSE 82 84 87  --  85 81  BUN 13 15 19   --  20 21*  CREATININE 0.97 0.99 1.05  --  1.07 1.03  CALCIUM 8.9 9.0 9.3  --  9.4 9.2  MG 1.8 1.5* 1.6* 1.7  --  1.5*  PHOS 5.3* 5.1* 4.7*  --  4.9* 5.2*   GFR: Estimated Creatinine Clearance: 91.1 mL/min (by C-G formula based on SCr of 1.03 mg/dL). Liver Function Tests:  Recent Labs Lab 01/26/17 6195 01/27/17 0527 01/28/17 0344 01/29/17 0646 01/30/17 0603  ALBUMIN 2.5* 2.5* 2.5* 2.5* 2.6*   No results for input(s): LIPASE, AMYLASE in the last 168 hours. No results for input(s): AMMONIA in the last 168 hours. Coagulation Profile: No results for input(s): INR, PROTIME in the last 168 hours. Cardiac Enzymes: No results for input(s): CKTOTAL, CKMB, CKMBINDEX, TROPONINI in the last 168 hours. BNP (last 3 results) No results for input(s): PROBNP in the last 8760  hours. HbA1C: No results for input(s): HGBA1C in the last 72 hours. CBG: No results for input(s): GLUCAP in the last 168 hours. Lipid Profile: No results for input(s): CHOL, HDL, LDLCALC, TRIG, CHOLHDL, LDLDIRECT in the last 72 hours. Thyroid Function Tests: No results for input(s): TSH, T4TOTAL, FREET4, T3FREE, THYROIDAB in the last 72 hours. Anemia Panel: No results for input(s): VITAMINB12, FOLATE, FERRITIN, TIBC, IRON, RETICCTPCT in the last 72 hours. Urine analysis:    Component Value Date/Time   COLORURINE YELLOW 12/20/2016 0706   APPEARANCEUR CLEAR 12/20/2016 0706   LABSPEC 1.026 12/20/2016 0706   PHURINE 6.0 12/20/2016 0706   GLUCOSEU NEGATIVE 12/20/2016 0706   HGBUR NEGATIVE 12/20/2016 0706   BILIRUBINUR NEGATIVE 12/20/2016 0706   KETONESUR NEGATIVE 12/20/2016 0706   PROTEINUR 100 (A) 12/20/2016 0706   NITRITE NEGATIVE 12/20/2016 0706   LEUKOCYTESUR NEGATIVE 12/20/2016 0706   Sepsis Labs: @LABRCNTIP (procalcitonin:4,lacticidven:4)  ) Recent Results (from the past 240 hour(s))  CSF culture     Status: None   Collection Time: 01/20/17 11:38 AM  Result Value Ref Range Status   Specimen Description CSF  Final   Special Requests TUBE 2  Final   Gram Stain   Final    CYTOSPIN SMEAR WBC PRESENT, PREDOMINANTLY MONONUCLEAR ENCAPSULATED YEAST SEEN CRITICAL RESULT CALLED TO, READ BACK BY AND VERIFIED WITH: Ardelia Mems RN 13:40 01/20/17 ([email or web address])    Culture NO GROWTH 3 DAYS  Final   Report Status 01/23/2017 FINAL  Final  Fungus Culture With Stain     Status: None (Preliminary result)   Collection Time: 01/20/17 11:38 AM  Result Value Ref Range Status   Fungus Stain Final report  Final    Comment: (NOTE) Performed At: [facility] [place], [place] [id number] Lindon Romp MD PY:[id number]    Fungus (Mycology) Culture PENDING  Incomplete   Fungal Source CSF  Final    Comment: TUBE 2  Fungus Culture Result     Status: None   Collection Time:  01/20/17 11:38 AM  Result Value Ref Range Status   Result 1 Comment  Final    Comment: (NOTE) KOH/Calcofluor preparation:  no fungus observed. Performed At: [facility] [address] [place], [place] [id number] Lindon Romp MD PF:[id number]   CSF culture     Status: None   Collection Time: 01/25/17  1:33 PM  Result Value Ref Range Status   Specimen Description CSF  Final   Special Requests NONE  Final   Gram Stain  Final    WBC PRESENT, PREDOMINANTLY MONONUCLEAR YEAST CYTOSPIN SMEAR CRITICAL RESULT CALLED TO, READ BACK BY AND VERIFIED WITH: Sharlene Dory RN, AT 6812 01/28/17 BY D. VANHOOK    Culture NO GROWTH 3 DAYS  Final   Report Status 01/29/2017 FINAL  Final  Fungus Culture With Stain     Status: None (Preliminary result)   Collection Time: 01/25/17  1:33 PM  Result Value Ref Range Status   Fungus Stain Final report  Final    Comment: (NOTE) Performed At: [facility] [place], [place] [id number] Lindon Romp MD [id number]    Fungus (Mycology) Culture PENDING  Incomplete   Fungal Source CSF  Final  Fungus Culture Result     Status: None   Collection Time: 01/25/17  1:33 PM  Result Value Ref Range Status   Result 1 Comment  Final    Comment: (NOTE) KOH/Calcofluor preparation:  no fungus observed. Performed At: [facility] [address] [place], [place] [id number] Lindon Romp MD SV:[id number]       Radiology Studies: No results found.   Scheduled Meds: . amantadine  100 mg Oral BID WC  . azithromycin  1,200 mg Oral Weekly  . feeding supplement (ENSURE ENLIVE)  237 mL Oral BID BM  . fluconazole  400 mg Oral Daily  . lidocaine (PF)  5 mL Intradermal Once  . magnesium oxide  400 mg Oral BID  . mouth rinse  15 mL Mouth Rinse BID  . multivitamin with minerals  1 tablet Oral Daily  . senna-docusate  2 tablet Oral BID  . sodium chloride flush  3 mL Intravenous Q12H  . sulfamethoxazole-trimethoprim  1 tablet  Oral Daily  . tamsulosin  0.4 mg Oral Daily   Continuous Infusions: . sodium chloride 50 mL/hr at 01/28/17 2010     LOS: 40 days   Time Spent in minutes   30  minutes  Jersey Ravenscroft D.O. on 01/30/2017 at 9:48 AM  Between 7am to 7pm - Pager - [phone]  After 7pm go to [email or web address] - password [email or web address]  And look for the night coverage person covering for me after hours  [facility] Office  [phone]

## 2017-01-31 LAB — RENAL FUNCTION PANEL
ANION GAP: 7 (ref 5–15)
Albumin: 2.5 g/dL — ABNORMAL LOW (ref 3.5–5.0)
BUN: 19 mg/dL (ref 6–20)
CHLORIDE: 103 mmol/L (ref 101–111)
CO2: 21 mmol/L — AB (ref 22–32)
Calcium: 9.2 mg/dL (ref 8.9–10.3)
Creatinine, Ser: 0.94 mg/dL (ref 0.61–1.24)
Glucose, Bld: 77 mg/dL (ref 65–99)
POTASSIUM: 4.3 mmol/L (ref 3.5–5.1)
Phosphorus: 5.4 mg/dL — ABNORMAL HIGH (ref 2.5–4.6)
Sodium: 131 mmol/L — ABNORMAL LOW (ref 135–145)

## 2017-01-31 LAB — FUNGUS CULTURE WITH STAIN

## 2017-01-31 LAB — CBC
HEMATOCRIT: 24.6 % — AB (ref 39.0–52.0)
Hemoglobin: 7.7 g/dL — ABNORMAL LOW (ref 13.0–17.0)
MCH: 27.6 pg (ref 26.0–34.0)
MCHC: 31.3 g/dL (ref 30.0–36.0)
MCV: 88.2 fL (ref 78.0–100.0)
Platelets: 165 10*3/uL (ref 150–400)
RBC: 2.79 MIL/uL — AB (ref 4.22–5.81)
RDW: 17.1 % — ABNORMAL HIGH (ref 11.5–15.5)
WBC: 2.5 10*3/uL — ABNORMAL LOW (ref 4.0–10.5)

## 2017-01-31 LAB — PATHOLOGIST SMEAR REVIEW: PATH REVIEW: INCREASED

## 2017-01-31 LAB — FUNGAL ORGANISM REFLEX

## 2017-01-31 LAB — FUNGUS CULTURE RESULT

## 2017-01-31 LAB — MAGNESIUM: Magnesium: 1.9 mg/dL (ref 1.7–2.4)

## 2017-01-31 NOTE — Progress Notes (Signed)
Occupational Therapy Treatment Patient Details Name: William Pena MRN: [id number] DOB: 1985-11-03 Today's Date: 01/31/2017    History of present illness Patient is a 31 y/o male presents with sepsis secondary to perirectal abscess s/p I&D 5/23 and disseminated cryptococcal infection including meningitis, bacteremia, and likely pneumonia. Intubated 5/28-6/5. Pt found to have acute ischemic strokes. 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 Second MRI- extension of the diffusion restriction in the left basal ganglia and right basal ganglia. s/p Lumbar drain 5/30. PMH includes advanced hiv disease   OT comments  Worked on self feeding with build up on spoon, positioned upright in bed. Pt requiring assist to scoop, but could bring loaded spoon to mouth and cup to mouth. Build up appeared to be of benefit, labeled and left in room. Performed AAROM B shoulders, tolerated well. Pt not verbalizing to OT throughout session, flat affect. Continues to be appropriate for SNF.  Follow Up Recommendations  SNF    Equipment Recommendations  None recommended by OT    Recommendations for Other Services      Precautions / Restrictions Precautions Precautions: Fall       Mobility Bed Mobility               General bed mobility comments: +2 total assist to pull pt up in bed for eating  Transfers                      Balance                                           ADL either performed or assessed with clinical judgement   ADL Overall ADL's : Needs assistance/impaired Eating/Feeding: Moderate assistance;Bed level (HOB up) Eating/Feeding Details (indicate cue type and reason): worked with pt on self feeding with foam build up on spoon Grooming: Dance movement psychotherapist;Bed level;Moderate assistance Grooming Details (indicate cue type and reason): continues to demonstrate poor thoroughness                                      Vision       Perception     Praxis      Cognition Arousal/Alertness: Lethargic Behavior During Therapy: Flat affect Overall Cognitive Status: Impaired/Different from baseline Area of Impairment: Attention;Following commands;Problem solving                   Current Attention Level: Sustained   Following Commands: Follows one step commands inconsistently;Follows one step commands with increased time     Problem Solving: Slow processing;Decreased initiation;Difficulty sequencing;Requires verbal cues;Requires tactile cues          Exercises General Exercises - Upper Extremity Shoulder Flexion: AAROM;Both;10 reps;Seated (upright in bed) Shoulder Extension: AAROM;Both;10 reps;Seated (upright in bed)   Shoulder Instructions       General Comments      Pertinent Vitals/ Pain       Pain Assessment: Faces Faces Pain Scale: No hurt  Home Living                                          Prior Functioning/Environment  Frequency  Min 3X/week        Progress Toward Goals  OT Goals(current goals can now be found in the care plan section)  Progress towards OT goals: Progressing toward goals  Acute Rehab OT Goals Patient Stated Goal: did not state Time For Goal Achievement: 02/13/17 Potential to Achieve Goals: [place] Discharge plan remains appropriate    Co-evaluation                 AM-PAC PT "6 Clicks" Daily Activity     Outcome Measure   Help from another person eating meals?: A Lot Help from another person taking care of personal grooming?: A Lot Help from another person toileting, which includes using toliet, bedpan, or urinal?: Total Help from another person bathing (including washing, rinsing, drying)?: Total Help from another person to put on and taking off regular upper body clothing?: Total Help from another person to put on and taking off regular lower body clothing?:  Total 6 Click Score: 8    End of Session    OT Visit Diagnosis: Cognitive communication deficit (R41.841);Apraxia (R48.2);Muscle weakness (generalized) (M62.81)   Activity Tolerance Patient tolerated treatment well   Patient Left in bed;with call bell/phone within reach;with bed alarm set   Nurse Communication          Time: 8185-6314 OT Time Calculation (min): 16 min  Charges: OT General Charges $OT Visit: 1 Procedure OT Treatments $Self Care/Home Management : 8-22 mins   Malka So 01/31/2017, 3:03 PM  [phone]

## 2017-01-31 NOTE — Progress Notes (Signed)
[facility] for Infectious [facility]  Date of Admission:  12/20/2016   Total days of cryptococcal therapy 41        Day 13 fluconazole         ASSESSMENT: His lumbar puncture yesterday showed improvement. His opening pressure was only 9 cm of water and he only had 12 white blood cells. His cryptococcal antigen has been slowly decreasing. 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  PLAN: 1. Continue fluconazole 2. 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 amantadine  100 mg Oral BID WC  . azithromycin  1,200 mg Oral Weekly  . feeding supplement (ENSURE ENLIVE)  237 mL Oral BID BM  . fluconazole  400 mg Oral Daily  . lidocaine (PF)  5 mL Intradermal Once  . magnesium oxide  400 mg Oral BID  . mouth rinse  15 mL Mouth Rinse BID  . multivitamin with minerals  1 tablet Oral Daily  . senna-docusate  2 tablet Oral BID  . sodium chloride flush  3 mL Intravenous Q12H  . sulfamethoxazole-trimethoprim  1 tablet Oral  Daily  . tamsulosin  0.4 mg Oral Daily    Review of Systems: Review of Systems  Unable to perform ROS: Mental acuity    Allergies  Allergen Reactions  . Carrot Oil Swelling    "throat swell"   . Augmentin [Amoxicillin-Pot Clavulanate]     "break out"    OBJECTIVE: Vitals:   01/30/17 2130 01/31/17 0537 01/31/17 0539 01/31/17 0800  BP: 104/62 (!) 87/56 99/64 98/76   Pulse: 90 78 76 79  Resp: 16 18  16   Temp: 98.5 F (36.9 C) 97.5 F (36.4 C)    TempSrc: Oral Oral    SpO2: 100% 100%  100%  Weight:      Height:       Body mass index is 22.06 kg/m.  Physical Exam  Constitutional:  [name] resting quietly in bed with his eyes open. [name] very slow to respond to questions. He did try to mouth and answer that I could not hear and understand.  Eyes: Conjunctivae are normal.  Neck: Neck supple.  Cardiovascular: Normal rate and regular rhythm.   No murmur heard. Pulmonary/Chest: Effort normal and breath sounds normal. He has no wheezes. He has no rales.  Abdominal: Soft. There is no tenderness.  Skin: No rash noted.    Lab Results Lab Results  Component Value Date   WBC 2.5 (L) 01/31/2017   HGB 7.7 (L) 01/31/2017   HCT  24.6 (L) 01/31/2017   MCV 88.2 01/31/2017   PLT 165 01/31/2017    Lab Results  Component Value Date   CREATININE 0.94 01/31/2017   BUN 19 01/31/2017   NA 131 (L) 01/31/2017   K 4.3 01/31/2017   CL 103 01/31/2017   CO2 21 (L) 01/31/2017    Lab Results  Component Value Date   ALT 41 01/20/2017   AST 45 (H) 01/20/2017   ALKPHOS 63 01/20/2017   BILITOT 0.5 01/20/2017     Microbiology: Recent Results (from the past 240 hour(s))  CSF culture     Status: None   Collection Time: 01/25/17  1:33 PM  Result Value Ref Range Status   Specimen Description CSF  Final   Special Requests NONE  Final   Gram Stain   Final    WBC PRESENT, PREDOMINANTLY MONONUCLEAR YEAST CYTOSPIN SMEAR CRITICAL RESULT CALLED TO, READ BACK BY AND VERIFIED WITH: Sharlene Dory  RN, AT 1038 01/28/17 BY D. VANHOOK    Culture NO GROWTH 3 DAYS  Final   Report Status 01/29/2017 FINAL  Final  Fungus Culture With Stain     Status: None (Preliminary result)   Collection Time: 01/25/17  1:33 PM  Result Value Ref Range Status   Fungus Stain Final report  Final    Comment: (NOTE) Performed At: [facility] [place], [place] [id number] Lindon Romp MD IH:[id number]    Fungus (Mycology) Culture PENDING  Incomplete   Fungal Source CSF  Final  Fungus Culture Result     Status: None   Collection Time: 01/25/17  1:33 PM  Result Value Ref Range Status   Result 1 Comment  Final    Comment: (NOTE) KOH/Calcofluor preparation:  no fungus observed. Performed At: [facility] [address] [name], [place] [id number] Lindon Romp MD [id number]   CSF culture     Status: None (Preliminary result)   Collection Time: 01/30/17  3:54 PM  Result Value Ref Range Status   Specimen Description CSF  Final   Special Requests NONE  Final   Gram Stain   Final    WBC PRESENT, PREDOMINANTLY MONONUCLEAR ENCAPSULATED YEAST SEEN CYTOSPIN SMEAR    Culture NO GROWTH < 24 HOURS  Final   Report Status PENDING  Incomplete    Michel Bickers, MD [facility] [phone] pager   [phone] cell 01/31/2017, 12:14 PM

## 2017-01-31 NOTE — Progress Notes (Signed)
  Speech Language Pathology Treatment: Dysphagia;Cognitive-Linquistic  Patient Details Name: William Pena MRN: [id number] DOB: 1986-01-12 Today's Date: 01/31/2017 Time: 8127-5170 SLP Time Calculation (min) (ACUTE ONLY): 15 min  Assessment / Plan / Recommendation Clinical Impression  Mom at bedside preparing breakfast and allowing Holdyn to self feed. He looks better, more alert. She inquired about potential adaptive equipment since he is attempting to self feed. SLP agrees and will discuss with OT. Observed consumption of thin via straw and grits with one delayed throat clear. Needed tactile cues for decreased volume with thin via straw due to excessive consecutive sips.   No spontaneous verbalizations during session however prompted/cued responses were less delayed and moderate cues to increase volume. More alert, participatory and attended to self feeding during breakfast with moderate cues. Will continue to treat.    HPI HPI: Patient is a 31 y/o male presents with sepsis secondary to perirectal abscess s/p I&D 5/23 and disseminated cryptococcal infection including meningitis, bacteremia, and likely pneumonia. Intubated 5/28-6/5. 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 Second MRI- extension of the diffusion restriction in the left basal ganglia and right basal ganglia. s/p Lumbar drain 5/30. PMH includes advanced hiv disease      SLP Plan  Continue with current plan of care       Recommendations  Diet recommendations: Regular;Thin liquid Liquids provided via: Cup;Straw Medication Administration: Whole meds with puree Supervision: Staff to assist with self feeding;Full supervision/cueing for compensatory strategies Compensations: Minimize environmental distractions;Slow rate;Small sips/bites;Other (Comment);Clear throat intermittently Postural Changes and/or Swallow Maneuvers: Seated upright 90 degrees;Upright 30-60 min after  meal                Oral Care Recommendations: Oral care BID Follow up Recommendations: Skilled Nursing facility;24 hour supervision/assistance SLP Visit Diagnosis: Dysphagia, oropharyngeal phase (R13.12);Cognitive communication deficit (Y17.494) Plan: Continue with current plan of care       GO                Houston Siren 01/31/2017, 9:20 AM  Orbie Pyo Colvin Caroli.Ed [employer] [phone]

## 2017-01-31 NOTE — Progress Notes (Signed)
PROGRESS NOTE    William Pena  ZHG:[id number] DOB: 1986-05-22 DOA: 12/20/2016 PCP: [name], MD   Chief Complaint  Patient presents with  . Rectal Bleeding  . Generalized Body Aches    Brief Narrative:  HPI on 12/20/2016 by Dr. Stark Klein (surgery) Pt is a 31 yo M with history of recurrent perirectal abscesses.  He was treated at [facility] with surgery in January by Dr. Ronita Hipps, but now is incarcerated in [place].  He has had multiple different types of drains, but currently just has a draining seton in place at the site of a fistula.  He has been to the ED 3 times in the last week.  Due to his current incarceration in [facility], he cannot be transferred to [place].  He reports severe rectal pain.  He has had fever to 103 and had sepsis workup this AM at [facility].  He did have a CT on 5/19 that showed small abscess and antibiotics were recommended.  He was not able to be compliant with that for unclear reasons.  He has also had abdominal pain. [name] also seen at [facility] for his AIDS by Dr. Tobie Poet.  [name] frequently homeless as well.  [name] reportedly on his HIV meds.  Last CD 4 count looks like 1 /microliter (10/04/2016) and last viral load looks like 50,936/mL (08/08/2016).  Interim history During admission he was also noted to have headache, fever, chills, night sweats, neck pain and lymphadenopathy. ID was consulted and recommended imaging of chest, LP and cultures be sent from perirectal I&D. Also his serum cryptococcal antigen titer was elevated raising the possibility of disseminated cryptococcal infection including lung involvement. Pulmonology was consulted by ID for bronchoscopy and washings in an effort to help determine if his CT changes reflect TB or possible pulmonary cryptococcal disease. Initially unable to perform bronchoscopy due to hypoxia. 5/28 patient had altered LOC, became very lethargic/obtunded requiring CT and LP, complicated by acute respiratory failure due to  altered mental status that required mechanical ventilation. He had a lumbar CSF drain to depression arise. Now extubated, drain removed. Failed swallowing evaluation and has Cor trak forte tube feeds. Mental status slightly better but waxing and waning. ID, neurology, neurosurgery and CCM consulted during hospital course. Care transferred to stepdown and [place] on 01/09/17. MS slowly improving. Cor Trak discontinued 6/12 and diet initiated. Transferred to medical bed 6/13.Acute kidney injury on 6/14 (creatinine 2.8), likely due to acute urinary retention & Amphotericin> resolved. 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 Lumbar puncture done on 01/20/2017 showed opening pressure of 31, better than before, it was 55 -CSF culture showed yeast -Discussed with infectious disease, Dr. Baxter Flattery. Patient will need repeat lumbar puncture as he did have an elevated opening pressure on previous lumbar puncture. -Interventional radiology consult appreciated, status post lumbar puncture on 01/25/2017. Opening pressure 22. CSF obtained and sent to the lab, results pending. -Discussed with Dr. Baxter Flattery, since patient is currently asymptomatic and alert, will defer VP shunt for now -s/p LP on 01/30/2017 by IR, with opening pressure 9cm. 3.5cc obtained.  CSF culture pending, yeast seen.   Acute urinary retention -Continue Foley catheter and Flomax -Patient failed  voiding trial twice  Acute kidney injury -Likely secondary to urinary retention and possibly amphotericin -Creatinine peaked to 2.84, currently 0.94 -Continue to monitor  BMP  Acute hypoxic respiratory failure secondary cryptococcal pneumonia -Required intubation, was extubated on 01/03/2017 -Patient currently on room air  Acute encephalopathy -Likely multi-factorial including cryptococcal meningitis, acute strokes, metabolic arrangements, medication versus respiratory etiology -Has been improving slowly.  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 -Neurology consulted and appreciated -No role for statins or antiplatelets in the setting continue antifungal treatment  HIV/AIDS -Infectious disease following -CD4 count on admission 21, high viral load greater than 1 million -Continue prophylaxis with Bactrim and azithromycin -ART not initiated due to high risk of IR IS, OD initiated in 1-2 weeks per ID  Perirectal abscess with pain -Patient initially admitted by general surgery, status post IND -Continue dressing changes twice a day -Continue wound care -Continue pain control with both IV and oral formulations  Genital herpes -Continue valtrex 1g BID (started on 6/28) for 7 days then needs chronic suppression   Hypomagnesemia -Magnesium 1.9 today (was given 4g IV on 01/30/17) -Continue oral supplementation (would not increase PO as this could lead to diarrhea) -Continue to monitor   Diarrhea -Stool culture negative, resolved  Anemia of chronic disease -Hemoglobin 7.7 this morning (has been around 8 for the past several days, however dropped to 7.6 on 01/29/17) -If hemoglobin continues to drop closer to 7, would transfuse. -Continue to monitor CBC  Hypertension -Continue IV hydralazine as needed  Thrombocytopenia -Unknown etiology, possibly medication effect -Currently stable, no reports of bleeding -Platelets 165 -Continue to monitor CBC  Facial swelling -Appears to have resolved, likely related to  hypoalbuminemia  Constipation -Continue bowel regimen  Physical deconditioning -Compensated by patient's comorbidities as well as prolonged hospitalization.  -Physical therapy assessed the patient, recommending SNF -SW consulted for placement, however, difficult to place at this time -Speech therapy assessed patient, recommended regular diet, thin liquids  Severe malnutrition  -in the context of chronic illness -nutrition consulted -continue feeding supplementation  DVT Prophylaxis  SCDs  Code Status: Full  Family Communication: Mother at bedside  Disposition Plan: Admitted. Dispo to SNF when stable and available  Consultants PCCM Infectious disease Interventional radiology General surgery Neurology Neurosurgery  Procedures  Lumbar puncture Lumbar drain placement on 5/28, discontinued 6/10 Intubation/extubation Cor Trak discontinued 01/10/2017 Lumbar puncture by interventional radiology on 01/25/2017 and 01/30/2017  Antibiotics   Anti-infectives    Start     Dose/Rate Route Frequency Ordered Stop   01/26/17 1545  valACYclovir (VALTREX) tablet 1,000 mg     1,000 mg Oral 2 times daily 01/26/17 1535 01/29/17 0816   01/24/17 1100  fluconazole (DIFLUCAN) tablet 400 mg     400 mg Oral Daily 01/24/17 1059     01/19/17 1530  fluconazole (DIFLUCAN) IVPB 400 mg  Status:  Discontinued     400 mg 100 mL/hr over 120 Minutes Intravenous Every 24 hours 01/19/17 1442 01/24/17 1059   01/16/17 2200  sulfamethoxazole-trimethoprim (BACTRIM DS,SEPTRA DS) 800-160 MG per tablet 1 tablet     1 tablet Oral Daily 01/16/17 2035     01/13/17 1000  sulfamethoxazole-trimethoprim (BACTRIM,SEPTRA) 200-40 MG/5ML suspension 10 mL  Status:  Discontinued     10 mL Oral Daily 01/12/17 0952 01/12/17 1149   01/12/17 1300  amphotericin B liposome (AMBISOME) 240 mg in dextrose 5 % 500 mL IVPB  Status:  Discontinued     240 mg 250 mL/hr over 120 Minutes Intravenous Every 24 hours 01/12/17 1155 01/12/17  1203   01/12/17 1300  amphotericin B liposome (AMBISOME) 240 mg in dextrose 5 % 500 mL IVPB  Status:  Discontinued     240 mg 250 mL/hr over 120 Minutes Intravenous Every 24 hours 01/12/17 1203 01/19/17 1442   01/12/17 1200  flucytosine (ANCOBON) capsule 1,500 mg  Status:  Discontinued     1,500 mg Oral Every 6 hours 01/12/17 0952 01/19/17 1442   01/06/17 1000  azithromycin (ZITHROMAX) tablet 1,200 mg     1,200 mg Oral Weekly 01/04/17 2339     01/06/17 1000  amphotericin B liposome (AMBISOME) 240 mg in dextrose 5 % 500 mL IVPB  Status:  Discontinued     4 mg/kg  59.8 kg 250 mL/hr over 120 Minutes Intravenous Every 24 hours 01/06/17 0815 01/12/17 1155   12/31/16 1000  sulfamethoxazole-trimethoprim (BACTRIM,SEPTRA) 200-40 MG/5ML suspension 10 mL  Status:  Discontinued     10 mL Per Tube Daily 12/30/16 1418 01/12/17 0952   12/31/16 1000  amphotericin B liposome (AMBISOME) 270 mg in dextrose 5 % 500 mL IVPB  Status:  Discontinued     4 mg/kg  67.6 kg 250 mL/hr over 120 Minutes Intravenous Every 24 hours 12/30/16 1510 01/06/17 0815   12/30/16 1000  azithromycin (ZITHROMAX) 200 MG/5ML suspension 1,200 mg  Status:  Discontinued     1,200 mg Per Tube Every Fri 12/26/16 2030 01/04/17 2339   12/29/16 1000  sulfamethoxazole-trimethoprim (BACTRIM,SEPTRA) 200-40 MG/5ML suspension 20 mL  Status:  Discontinued     20 mL Per Tube Daily 12/28/16 1226 12/30/16 1418   12/28/16 1000  amphotericin B liposome (AMBISOME) 240 mg in dextrose 5 % 500 mL IVPB  Status:  Discontinued     3.5 mg/kg  67.6 kg 250 mL/hr over 120 Minutes Intravenous Every 24 hours 12/28/16 0853 12/30/16 1510   12/28/16 0900  amphotericin B liposome (AMBISOME) 240 mg in dextrose 5 % 500 mL IVPB  Status:  Discontinued     3.5 mg/kg  67.6 kg 250 mL/hr over 120 Minutes Intravenous Every 24 hours 12/28/16 0832 12/28/16 0853   12/27/16 0000  flucytosine (ANCOBON) capsule 1,500 mg  Status:  Discontinued     1,500 mg Per Tube Every 6 hours  12/26/16 1909 01/12/17 0952   12/26/16 2200  sulfamethoxazole-trimethoprim (BACTRIM DS,SEPTRA DS) 800-160 MG per tablet 1 tablet  Status:  Discontinued     1 tablet Per Tube Every 12 hours 12/26/16 1909 12/26/16 1912   12/26/16 2200  valACYclovir (VALTREX) tablet 1,000 mg     1,000 mg Per Tube 2 times daily 12/26/16 1909 01/01/17 2359   12/26/16 2200  sulfamethoxazole-trimethoprim (BACTRIM,SEPTRA) 200-40 MG/5ML suspension 20 mL  Status:  Discontinued     20 mL Per Tube Every 12 hours 12/26/16 1912 12/28/16 1226   12/23/16 2200  sulfamethoxazole-trimethoprim (BACTRIM DS,SEPTRA DS) 800-160 MG per tablet 1 tablet  Status:  Discontinued     1 tablet Oral Every 12 hours 12/23/16 1822 12/26/16 1909   12/23/16 1900  cefTRIAXone (ROCEPHIN) 2 g in dextrose 5 % 50 mL IVPB  Status:  Discontinued     2 g 100 mL/hr over 30 Minutes Intravenous Every 24 hours 12/23/16 1821 12/28/16 1122   12/23/16 0900  amphotericin B liposome (AMBISOME) 200 mg in dextrose 5 % 500 mL IVPB  Status:  Discontinued     200 mg 250 mL/hr  over 120 Minutes Intravenous Every 24 hours 12/22/16 0919 12/28/16 0832   12/23/16 0800  azithromycin (ZITHROMAX) tablet 1,200 mg  Status:  Discontinued     1,200 mg Oral Weekly 12/22/16 0811 12/26/16 2029   12/22/16 2200  valACYclovir (VALTREX) tablet 1,000 mg  Status:  Discontinued     1,000 mg Oral 2 times daily 12/22/16 1611 12/26/16 1909   12/22/16 1800  amoxicillin-clavulanate (AUGMENTIN) 875-125 MG per tablet 1 tablet  Status:  Discontinued     1 tablet Oral 2 times daily with meals 12/22/16 1446 12/23/16 1821   12/22/16 1800  doxycycline (VIBRA-TABS) tablet 100 mg  Status:  Discontinued     100 mg Oral 2 times daily with meals 12/22/16 1446 12/23/16 1821   12/22/16 1200  flucytosine (ANCOBON) capsule 1,500 mg  Status:  Discontinued     1,500 mg Oral Every 6 hours 12/22/16 0614 12/26/16 1909   12/22/16 1000  azithromycin (ZITHROMAX) tablet 250 mg  Status:  Discontinued     250 mg Oral  Daily 12/21/16 0224 12/21/16 1131   12/22/16 1000  doxycycline (VIBRA-TABS) tablet 100 mg  Status:  Discontinued     100 mg Oral Every 12 hours 12/22/16 0942 12/22/16 1446   12/22/16 0000  amphotericin B liposome (AMBISOME) 190 mg in dextrose 5 % 500 mL IVPB  Status:  Discontinued     3 mg/kg  64.2 kg 250 mL/hr over 120 Minutes Intravenous Every 24 hours 12/21/16 2313 12/22/16 0919   12/22/16 0000  flucytosine (ANCOBON) capsule 250 mg  Status:  Discontinued     250 mg Oral Every 6 hours 12/21/16 2314 12/21/16 2325   12/22/16 0000  flucytosine (ANCOBON) capsule 1,500 mg  Status:  Discontinued     1,500 mg Oral Every 6 hours 12/21/16 2326 12/22/16 0614   12/21/16 1645  cefTRIAXone (ROCEPHIN) 2 g in dextrose 5 % 50 mL IVPB  Status:  Discontinued     2 g 100 mL/hr over 30 Minutes Intravenous Every 24 hours 12/21/16 1539 12/22/16 1446   12/21/16 1645  metroNIDAZOLE (FLAGYL) IVPB 500 mg  Status:  Discontinued     500 mg 100 mL/hr over 60 Minutes Intravenous Every 8 hours 12/21/16 1539 12/21/16 2348   12/21/16 1600  cefTRIAXone (ROCEPHIN) 2 g in dextrose 5 % 50 mL IVPB  Status:  Discontinued     2 g 100 mL/hr over 30 Minutes Intravenous Every 24 hours 12/21/16 1131 12/21/16 1558   12/21/16 1400  vancomycin (VANCOCIN) IVPB 750 mg/150 ml premix  Status:  Discontinued     750 mg 150 mL/hr over 60 Minutes Intravenous Every 8 hours 12/21/16 0142 12/22/16 0942   12/21/16 1000  sulfamethoxazole-trimethoprim (BACTRIM DS,SEPTRA DS) 800-160 MG per tablet 1 tablet  Status:  Discontinued     1 tablet Oral Daily 12/21/16 0058 12/21/16 0100   12/21/16 1000  abacavir-dolutegravir-lamiVUDine (TRIUMEQ) 600-50-300 MG per tablet 1 tablet  Status:  Discontinued     1 tablet Oral Daily 12/21/16 0204 12/22/16 0903   12/21/16 1000  sulfamethoxazole-trimethoprim (BACTRIM DS,SEPTRA DS) 800-160 MG per tablet 1 tablet  Status:  Discontinued     1 tablet Oral Daily 12/21/16 0204 12/23/16 1822   12/21/16 0800  ceFEPIme  (MAXIPIME) 2 g in dextrose 5 % 50 mL IVPB  Status:  Discontinued     2 g 100 mL/hr over 30 Minutes Intravenous Every 8 hours 12/21/16 0137 12/21/16 1131   12/21/16 0600  piperacillin-tazobactam (ZOSYN) IVPB 3.375 g  Status:  Discontinued     3.375 g 12.5 mL/hr over 240 Minutes Intravenous Every 8 hours 12/21/16 0058 12/21/16 0132   12/21/16 0200  metroNIDAZOLE (FLAGYL) IVPB 500 mg  Status:  Discontinued     500 mg 100 mL/hr over 60 Minutes Intravenous Every 8 hours 12/21/16 0137 12/22/16 1446   12/21/16 0145  ceFEPIme (MAXIPIME) 2 g in dextrose 5 % 50 mL IVPB     2 g 100 mL/hr over 30 Minutes Intravenous  Once 12/21/16 0137 12/21/16 0358   12/21/16 0145  vancomycin (VANCOCIN) IVPB 1000 mg/200 mL premix     1,000 mg 200 mL/hr over 60 Minutes Intravenous  Once 12/21/16 0142 12/21/16 0429   12/21/16 0130  piperacillin-tazobactam (ZOSYN) IVPB 3.375 g  Status:  Discontinued     3.375 g 100 mL/hr over 30 Minutes Intravenous  Once 12/21/16 0115 12/21/16 0132   12/21/16 0130  azithromycin (ZITHROMAX) powder 1 g     1 g Oral  Once 12/21/16 0125 12/21/16 0328      Subjective:   William Pena seen and examined today.  Continues complain of rectal pain. Patient very soft spoken. Denies chest pain, shortness breath, abdominal pain, nausea or vomiting, diarrhea, constipation, headache or dizziness. Patient does inquire about possible rehabilitation placement and if he has to go to a "home".  Objective:   Vitals:   01/30/17 2130 01/31/17 0537 01/31/17 0539 01/31/17 0800  BP: 104/62 (!) 87/56 99/64 98/76   Pulse: 90 78 76 79  Resp: 16 18  16   Temp: 98.5 F (36.9 C) 97.5 F (36.4 C)    TempSrc: Oral Oral    SpO2: 100% 100%  100%  Weight:      Height:        Intake/Output Summary (Last 24 hours) at 01/31/17 1222 Last data filed at 01/31/17 1000  Gross per 24 hour  Intake          2604.17 ml  Output             1750 ml  Net           854.17 ml   Filed Weights   01/10/17 0900  01/11/17 0640 01/11/17 0905  Weight: 63 kg (138 lb 14.2 oz) 60 kg (132 lb 4.4 oz) 62 kg (136 lb 11 oz)   Exam  General: Well developed, thin, no distress  HEENT: NCAT, mucous membranes moist.   Cardiovascular: S1 S2 auscultated, no murmurs, RRR  Respiratory: Clear to auscultation bilaterally with equal chest rise, no wheezing noted  Abdomen: Soft, nontender, nondistended, + bowel sounds  Extremities: warm dry without cyanosis clubbing or edema  Neuro: AAOx3, nonfocal, slow to respond, soft spoken  Skin: Without rashes, dry, multiple tattoos   Psych: Appropriate  Data Reviewed: I have personally reviewed following labs and imaging studies  CBC:  Recent Labs Lab 01/26/17 0613 01/28/17 0344 01/29/17 0645 01/29/17 1243 01/30/17 0603 01/31/17 0359  WBC 2.2* 2.4* 3.0*  --  2.9* 2.5*  HGB 8.0* 8.0* 7.6* 7.6* 7.9* 7.7*  HCT 25.4* 25.5* 24.7* 23.8* 25.3* 24.6*  MCV 88.8 88.9 89.5  --  91.0 88.2  PLT 190 180 174  --  161 301   Basic Metabolic Panel:  Recent Labs Lab 01/27/17 0527 01/28/17 0344 01/29/17 0645 01/29/17 0646 01/30/17 0603 01/31/17 0359  NA 131* 132*  --  131* 134* 131*  K 4.9 4.4  --  4.5 4.5 4.3  CL 100* 103  --  101 106 103  CO2 25  23  --  24 22 21*  GLUCOSE 84 87  --  85 81 77  BUN 15 19  --  20 21* 19  CREATININE 0.99 1.05  --  1.07 1.03 0.94  CALCIUM 9.0 9.3  --  9.4 9.2 9.2  MG 1.5* 1.6* 1.7  --  1.5* 1.9  PHOS 5.1* 4.7*  --  4.9* 5.2* 5.4*   GFR: Estimated Creatinine Clearance: 99.9 mL/min (by C-G formula based on SCr of 0.94 mg/dL). Liver Function Tests:  Recent Labs Lab 01/27/17 0527 01/28/17 0344 01/29/17 0646 01/30/17 0603 01/31/17 0359  ALBUMIN 2.5* 2.5* 2.5* 2.6* 2.5*   No results for input(s): LIPASE, AMYLASE in the last 168 hours. No results for input(s): AMMONIA in the last 168 hours. Coagulation Profile: No results for input(s): INR, PROTIME in the last 168 hours. Cardiac Enzymes: No results for input(s): CKTOTAL,  CKMB, CKMBINDEX, TROPONINI in the last 168 hours. BNP (last 3 results) No results for input(s): PROBNP in the last 8760 hours. HbA1C: No results for input(s): HGBA1C in the last 72 hours. CBG: No results for input(s): GLUCAP in the last 168 hours. Lipid Profile: No results for input(s): CHOL, HDL, LDLCALC, TRIG, CHOLHDL, LDLDIRECT in the last 72 hours. Thyroid Function Tests: No results for input(s): TSH, T4TOTAL, FREET4, T3FREE, THYROIDAB in the last 72 hours. Anemia Panel: No results for input(s): VITAMINB12, FOLATE, FERRITIN, TIBC, IRON, RETICCTPCT in the last 72 hours. Urine analysis:    Component Value Date/Time   COLORURINE YELLOW 12/20/2016 0706   APPEARANCEUR CLEAR 12/20/2016 0706   LABSPEC 1.026 12/20/2016 0706   PHURINE 6.0 12/20/2016 0706   GLUCOSEU NEGATIVE 12/20/2016 0706   HGBUR NEGATIVE 12/20/2016 0706   BILIRUBINUR NEGATIVE 12/20/2016 0706   KETONESUR NEGATIVE 12/20/2016 0706   PROTEINUR 100 (A) 12/20/2016 0706   NITRITE NEGATIVE 12/20/2016 0706   LEUKOCYTESUR NEGATIVE 12/20/2016 0706   Sepsis Labs: @LABRCNTIP (procalcitonin:4,lacticidven:4)  ) Recent Results (from the past 240 hour(s))  CSF culture     Status: None   Collection Time: 01/25/17  1:33 PM  Result Value Ref Range Status   Specimen Description CSF  Final   Special Requests NONE  Final   Gram Stain   Final    WBC PRESENT, PREDOMINANTLY MONONUCLEAR YEAST CYTOSPIN SMEAR CRITICAL RESULT CALLED TO, READ BACK BY AND VERIFIED WITH: Sharlene Dory RN, AT 1601 01/28/17 BY D. VANHOOK    Culture NO GROWTH 3 DAYS  Final   Report Status 01/29/2017 FINAL  Final  Fungus Culture With Stain     Status: None (Preliminary result)   Collection Time: 01/25/17  1:33 PM  Result Value Ref Range Status   Fungus Stain Final report  Final    Comment: (NOTE) Performed At: [facility] [place], [place] [id number] Lindon Romp MD [id number]    Fungus (Mycology) Culture PENDING  Incomplete    Fungal Source CSF  Final  Fungus Culture Result     Status: None   Collection Time: 01/25/17  1:33 PM  Result Value Ref Range Status   Result 1 Comment  Final    Comment: (NOTE) KOH/Calcofluor preparation:  no fungus observed. Performed At: [facility] [place], [place] [id number] Lindon Romp MD [id number]   CSF culture     Status: None (Preliminary result)   Collection Time: 01/30/17  3:54 PM  Result Value Ref Range Status   Specimen Description CSF  Final   Special Requests NONE  Final  Gram Stain   Final    WBC PRESENT, PREDOMINANTLY MONONUCLEAR ENCAPSULATED YEAST SEEN CYTOSPIN SMEAR    Culture NO GROWTH < 24 HOURS  Final   Report Status PENDING  Incomplete      Radiology Studies: Dg Fluoro Guide Lumbar Puncture  Result Date: 01/30/2017 CLINICAL DATA:  HIV. EXAM: DIAGNOSTIC LUMBAR PUNCTURE UNDER FLUOROSCOPIC GUIDANCE FLUOROSCOPY TIME:  Fluoroscopy Time:  1 minutes and 12 seconds Radiation Exposure Index (if provided by the fluoroscopic device): Not applicable. Number of Acquired Spot Images: 1 PROCEDURE: Informed consent was obtained from the patient's mother prior to the procedure, including potential complications of headache, allergy, and pain. With the patient prone, the lower back was prepped with Betadine. 1% Lidocaine was used for local anesthesia. Lumbar puncture was performed at the L2-3 level using a 20 gauge needle with return of clear CSF with an opening pressure of 9 cm water. 3.5 ml of CSF were obtained for laboratory studies. The patient tolerated the procedure well and there were no apparent complications. Further fluid could not be obtained secondary to patient's clinical status and low pressure. Closing pressure was not performed secondary to low pressure throughout. IMPRESSION: Non complicated lumbar puncture as detailed above. Electronically Signed   By: Abigail Miyamoto M.D.   On: 01/30/2017 16:26     Scheduled Meds: . amantadine   100 mg Oral BID WC  . azithromycin  1,200 mg Oral Weekly  . feeding supplement (ENSURE ENLIVE)  237 mL Oral BID BM  . fluconazole  400 mg Oral Daily  . lidocaine (PF)  5 mL Intradermal Once  . magnesium oxide  400 mg Oral BID  . mouth rinse  15 mL Mouth Rinse BID  . multivitamin with minerals  1 tablet Oral Daily  . senna-docusate  2 tablet Oral BID  . sodium chloride flush  3 mL Intravenous Q12H  . sulfamethoxazole-trimethoprim  1 tablet Oral Daily  . tamsulosin  0.4 mg Oral Daily   Continuous Infusions: . sodium chloride 50 mL/hr at 01/30/17 2251     LOS: 41 days   Time Spent in minutes   30  minutes  Jujuan Dugo D.O. on 01/31/2017 at 12:22 PM  Between 7am to 7pm - Pager - [phone]  After 7pm go to [email or web address] - password [email or web address]  And look for the night coverage person covering for me after hours  [facility] Office  [phone]

## 2017-02-01 LAB — RENAL FUNCTION PANEL
ALBUMIN: 2.6 g/dL — AB (ref 3.5–5.0)
ANION GAP: 5 (ref 5–15)
BUN: 19 mg/dL (ref 6–20)
CO2: 23 mmol/L (ref 22–32)
Calcium: 9.2 mg/dL (ref 8.9–10.3)
Chloride: 103 mmol/L (ref 101–111)
Creatinine, Ser: 1.06 mg/dL (ref 0.61–1.24)
Glucose, Bld: 91 mg/dL (ref 65–99)
PHOSPHORUS: 5.3 mg/dL — AB (ref 2.5–4.6)
POTASSIUM: 4.1 mmol/L (ref 3.5–5.1)
Sodium: 131 mmol/L — ABNORMAL LOW (ref 135–145)

## 2017-02-01 LAB — CBC
HEMATOCRIT: 24.5 % — AB (ref 39.0–52.0)
Hemoglobin: 7.7 g/dL — ABNORMAL LOW (ref 13.0–17.0)
MCH: 27.8 pg (ref 26.0–34.0)
MCHC: 31.4 g/dL (ref 30.0–36.0)
MCV: 88.4 fL (ref 78.0–100.0)
PLATELETS: 159 10*3/uL (ref 150–400)
RBC: 2.77 MIL/uL — AB (ref 4.22–5.81)
RDW: 16.9 % — AB (ref 11.5–15.5)
WBC: 2.6 10*3/uL — AB (ref 4.0–10.5)

## 2017-02-01 LAB — MAGNESIUM: Magnesium: 1.7 mg/dL (ref 1.7–2.4)

## 2017-02-01 NOTE — Progress Notes (Signed)
  Speech Language Pathology Treatment: Dysphagia  Patient Details Name: William Pena MRN: [id number] DOB: 15-Mar-1986 Today's Date: 02/01/2017 Time: 7290-2111 SLP Time Calculation (min) (ACUTE ONLY): 16 min  Assessment / Plan / Recommendation Clinical Impression  Dysphagia treatment provided to check diet tolerance/ self feeding. Pt alert and making some attempts to communicate today; voice was soft and speech was mostly unintelligible. Observed pt with breakfast tray with pt using foam build-up provided by OT for feeding. Pt needed assistance to scoop up food (was somewhat hesitant to accept assistance with this); once food was on spoon pt brought it to mouth without difficulty. No overt s/s of aspiration noted with regular foods/ thin liquids; however, risk continues to be increased due to cognitive status. Continue regular diet/ thin liquids with full supervision/ assistance with feeding, aspiration precautions posted in room. Will continue to follow for diet tolerance/ cognitive treatment.  HPI HPI: Patient is a 31 y/o male presents with sepsis secondary to perirectal abscess s/p I&D 5/23 and disseminated cryptococcal infection including meningitis, bacteremia, and likely pneumonia. Intubated 5/28-6/5. 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 Second MRI- extension of the diffusion restriction in the left basal ganglia and right basal ganglia. s/p Lumbar drain 5/30. PMH includes advanced hiv disease      SLP Plan  Continue with current plan of care       Recommendations  Diet recommendations: Regular;Thin liquid Liquids provided via: Cup;Straw Medication Administration: Whole meds with puree Supervision: Staff to assist with self feeding;Full supervision/cueing for compensatory strategies Compensations: Minimize environmental distractions;Slow rate;Small sips/bites Postural Changes and/or Swallow Maneuvers: Seated upright 90  degrees                Oral Care Recommendations: Oral care BID Follow up Recommendations: Skilled Nursing facility SLP Visit Diagnosis: Dysphagia, oropharyngeal phase (R13.12) Plan: Continue with current plan of care       [place], [place], [place] 02/01/2017, 10:54 AM [id number]

## 2017-02-01 NOTE — Progress Notes (Signed)
Physical Therapy Treatment Patient Details Name: William Pena MRN: [id number] DOB: 10/03/1985 Today's Date: 02/01/2017    History of Present Illness Patient is a 31 y/o male presents with sepsis secondary to perirectal abscess s/p I&D 5/23 and disseminated cryptococcal infection including meningitis, bacteremia, and likely pneumonia. Intubated 5/28-6/5. Pt found to have acute ischemic strokes. 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 Second MRI- extension of the diffusion restriction in the left basal ganglia and right basal ganglia. s/p Lumbar drain 5/30. PMH includes advanced hiv disease    PT Comments    Patient continues to require max/total A +2-3 for mobility. Pt communicating minimally and non verbal however will follow commands inconsistently when given verbal and tactile cues. Continue to progress as tolerated with anticipated d/c to SNF for further skilled PT services.    Follow Up Recommendations  SNF     Equipment Recommendations  Other (comment) (TBD next venue)    Recommendations for Other Services       Precautions / Restrictions Precautions Precautions: Fall Required Braces or Orthoses: Other Brace/Splint (R elbow soft splint)    Mobility  Bed Mobility Overal bed mobility: Needs Assistance Bed Mobility: Rolling;Sidelying to Sit;Sit to Sidelying Rolling: Max assist Sidelying to sit: Max assist;+2 for physical assistance     Sit to sidelying: Max assist General bed mobility comments: first trial coming into sitting pt did not initiate task however second trial pt did initiate elevating trunk after assistance to bring bilat LE and hips toward EOB; use of bed pad  Transfers Overall transfer level: Needs assistance Equipment used:  (stedy) Transfers: Sit to/from Stand Sit to Stand: Max assist (+3)         General transfer comment: total assist to transfer with stedy; multimodal cues for hand  placement and technique; third person behind to elevate hips   Ambulation/Gait                 Stairs            Wheelchair Mobility    Modified Rankin (Stroke Patients Only)       Balance Overall balance assessment: Needs assistance Sitting-balance support: Feet supported Sitting balance-Leahy Scale: Zero Sitting balance - Comments: worked on sitting balance EOB ~15 mins total prior to transfer with Stedy; pt has heavy R lateral and posterior lean; pt able to lean laterally on L elbow and push himself most of the way up but unable to maintain balance once trunk is elevated; unable to push up with R UE Postural control: Posterior lean;Right lateral lean   Standing balance-Leahy Scale: Zero Standing balance comment: attempted with stedy, unable to achieve fully upright                            Cognition Arousal/Alertness: Awake/alert Behavior During Therapy: Flat affect Overall Cognitive Status: Impaired/Different from baseline Area of Impairment: Following commands;Problem solving                       Following Commands: Follows one step commands inconsistently (with multimodal cues)     Problem Solving: Slow processing;Decreased initiation;Difficulty sequencing;Requires verbal cues;Requires tactile cues General Comments: non verbal communication      Exercises General Exercises - Upper Extremity Shoulder Flexion: Both;AAROM;Seated;10 reps Elbow Extension: PROM;10 reps;AAROM;Right;Seated Other Exercises Other Exercises: R soft elbow splint placed, tolerated well, RN aware it is not a restraint    General Comments  Pertinent Vitals/Pain Pain Assessment: Faces Faces Pain Scale: Hurts a little bit Pain Location: buttocks Pain Descriptors / Indicators: Sore Pain Intervention(s): Monitored during session;Repositioned    Home Living                      Prior Function            PT Goals (current goals can now  be found in the care plan section) Acute Rehab PT Goals Patient Stated Goal: did not state Progress towards PT goals: Not progressing toward goals - comment    Frequency    Min 3X/week      PT Plan Current plan remains appropriate    Co-evaluation PT/OT/SLP Co-Evaluation/Treatment: Yes Reason for Co-Treatment: For patient/therapist safety;To address functional/ADL transfers;Necessary to address cognition/behavior during functional activity PT goals addressed during session: Mobility/safety with mobility;Balance OT goals addressed during session: Strengthening/ROM;Proper use of Adaptive equipment and DME      AM-PAC PT "6 Clicks" Daily Activity  Outcome Measure  Difficulty turning over in bed (including adjusting bedclothes, sheets and blankets)?: Total Difficulty moving from lying on back to sitting on the side of the bed? : Total Difficulty sitting down on and standing up from a chair with arms (e.g., wheelchair, bedside commode, etc,.)?: Total Help needed moving to and from a bed to chair (including a wheelchair)?: Total Help needed walking in hospital room?: Total Help needed climbing 3-5 steps with a railing? : Total 6 Click Score: 6    End of Session Equipment Utilized During Treatment: Gait belt;Other (comment) Charlaine Dalton) Activity Tolerance: Patient tolerated treatment well Patient left: in chair;with call bell/phone within reach;with chair alarm set Nurse Communication: Mobility status;Need for lift equipment PT Visit Diagnosis: Muscle weakness (generalized) (M62.81);Other abnormalities of gait and mobility (R26.89);Hemiplegia and hemiparesis Hemiplegia - Right/Left: Left Hemiplegia - dominant/non-dominant: Non-dominant Hemiplegia - caused by: Cerebral infarction     Time: 2637-8588 PT Time Calculation (min) (ACUTE ONLY): 45 min  Charges:  $Therapeutic Activity: 23-37 mins                    G Codes:       Earney Navy, PTA Pager: [phone]      Darliss Cheney 02/01/2017, 4:39 PM

## 2017-02-01 NOTE — Progress Notes (Signed)
Patient ID: William Pena, male   DOB: 19-Mar-1986, 31 y.o.   MRN: [id number]          [facility]    Date of Admission:  12/20/2016   Total days of cryptococcal therapy 42        Day 14 fluconazole         [name] a little more alert today and speaks with a little louder and stronger voice. He denies any headache. His recent CSF cultures remain negative. His opening pressure was in the low-normal range which is a significant improvement. I will continue induction doses of fluconazole for 4 more weeks then decrease the dose. I will hold off on starting antiretroviral therapy until he concludes induction therapy in 4 weeks.  Michel Bickers, MD [facility] [phone] pager   [phone] cell 02/01/2017, 3:33 PM

## 2017-02-01 NOTE — Progress Notes (Signed)
Patient ID: Kaylob Wallen, male   DOB: 04-13-1986, 31 y.o.   MRN: [id number]                                                                PROGRESS NOTE                                                                                                                                                                                                             Patient Demographics:    Franki Stemen, is a 31 y.o. male, DOB - Jun 29, 1986, [id number]  Admit date - 12/20/2016   Admitting Physician Modena Jansky, MD  Outpatient Primary MD for the patient is [name], [name], MD  LOS - 42  Outpatient Specialists:  Chief Complaint  Patient presents with  . Rectal Bleeding  . Generalized Body Aches       Brief Narrative  HPI on 12/20/2016 by Dr. Stark Klein (surgery) Pt is a 31 yo M with history of recurrent perirectal abscesses. He was treated at [facility] with surgery in January by Dr. Ronita Hipps, but now is incarcerated in [place]. He has had multiple different types of drains, but currently just has a draining seton in place at the site of a fistula. He has been to the ED 3 times in the last week. Due to his current incarceration in [facility], he cannot be transferred to [place]. He reports severe rectal pain. He has had fever to 103 and had sepsis workup this AM at [facility] long. He did have a CT on 5/19 that showed small abscess and antibiotics were recommended. He was not able to be compliant with that for unclear reasons. He has also had abdominal pain. [name] also seen at [facility] for his AIDS by Dr. Tobie Poet. [name] frequently homeless as well. [name] reportedly on his HIV meds. Last CD 4 count looks like 1 /microliter (10/04/2016) and last viral load looks like 50,936/mL (08/08/2016).  Interim history During admission he was also noted to have headache, fever, chills, night sweats, neck pain and lymphadenopathy. ID was consulted and recommended imaging of chest, LP and cultures be sent  from perirectal I&D. Also his serum cryptococcal antigen titer was elevated raising the possibility of disseminated cryptococcal infection including lung involvement. Pulmonology was consulted by ID  for bronchoscopy and washings in an effort to help determine if his CT changes reflect TB or possible pulmonary cryptococcal disease. Initially unable to perform bronchoscopy due to hypoxia. 5/28 patient had altered LOC, became very lethargic/obtunded requiring CT and LP, complicated by acute respiratory failure due to altered mental status that required mechanical ventilation. He had a lumbar CSF drain to depression arise. Now extubated, drain removed. Failed swallowing evaluation and has Cor trak forte tube feeds. Mental status slightly better but waxing and waning. ID, neurology, neurosurgery and CCM consulted during hospital course. Care transferred to stepdown and [place] on 01/09/17. MS slowly improving. Cor Trak discontinued 6/12 and diet initiated. Transferred to medical bed 6/13.Acute kidney injury on 6/14 (creatinine 2.8), likely due to acute urinary retention & Amphotericin> resolved. Antimicrobials per ID   Subjective:    [name] today doesn't say much,  Doesn't c/o headache.  No chest pain, No abdominal pain - No Nausea, No new weakness tingling or numbness, No Cough - SOB.    Assessment  & Plan :    Principal Problem:   Perirectal abscess Active Problems:   Chronic pain   AIDS (acquired immune deficiency syndrome) (HCC)   Hyponatremia   Normocytic anemia   Elevated LFTs   Pulmonary infiltrates   Hypomagnesemia   Encephalopathy acute   SIRS (systemic inflammatory response syndrome) (HCC)   Acute respiratory failure (HCC)   Rectal abscess   Cryptococcal meningoencephalitis (HCC)   Disseminated cryptococcosis (HCC)   Cavitary pneumonia   Diffuse lymphadenopathy   Drug rash   Elevated intracranial pressure   HIV disease (HCC)   Lymphadenopathy of head and neck   HIV (human  immunodeficiency virus infection) ([place])   Meningitis   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 Lumbar puncture done on 01/20/2017 showed opening pressure of 31, better than before, it was 55 -CSF gram stain showed yeast -Discussed with infectious disease, Dr. Baxter Flattery. Patient will need repeat lumbar puncture as he did have an elevated opening pressure on previous lumbar puncture. -Interventional radiology consult appreciated, status post lumbar puncture on 01/25/2017. Opening pressure 22. CSF obtained and sent to the lab, results pending. -Discussed with Dr. Baxter Flattery, since patient is currently asymptomatic and alert, will defer VP shunt for now -s/p LP on 01/30/2017 by IR, with opening pressure 9cm. 3.5cc obtained.    CSF culture 6/22=> ngtd CSF culture 6/27=> ngtd CSF culture 7/2=> ngtd Appreciate ID input  Acute urinary retention -Continue Foley catheter and Flomax -Patient failed voiding trial twice  Acute kidney injury -Likely secondary to urinary retention and possibly amphotericin -Creatinine peaked to 2.84, currently 0.94 -Continue to monitor BMP  Hyponatremia Stable  Severe Protein Calorie Malnutrition prostat  Acute hypoxic respiratory failure secondary cryptococcal pneumonia -Required intubation, was extubated on 01/03/2017 -Patient currently on room air  Acute encephalopathy -Likely multi-factorial including cryptococcal meningitis, acute strokes, metabolic  arrangements, medication versus respiratory etiology   -Has been improving slowly.  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, involvement from  cryptococcal infection   -Neurology consulted and appreciated -No role for statins or antiplatelets in the setting continue antifungal treatment       HIV/AIDS -Infectious disease following -CD4 count on admission 21, high viral load greater than 1 million -Continue prophylaxis with Bactrim and azithromycin -ART not initiated due to high risk of IR IS, OD initiated in 1-2 weeks per ID  Perirectal abscess with pain -Patient initially admitted by general surgery, status post IND -Continue dressing changes twice a day -Continue wound care -Continue pain control with both IV and oral formulations  Genital herpes -Continue valtrex 1g BID (started on 6/28) for 7 days then needs chronic suppression   Hypomagnesemia -Magnesium 1.9 today (was given 4g IV on 01/30/17) -Continue oral supplementation (would not increase PO as this could lead to diarrhea) -Continue to monitor   Diarrhea -Stool culture negative, resolved  Anemia of chronic disease -Hemoglobin 7.7 this morning (has been around 8 for the past several days, however dropped to 7.6 on 01/29/17) -If hemoglobin continues to drop closer to 7, would transfuse. -Continue to monitor CBC  Hypertension -Continue IV hydralazine as needed  Thrombocytopenia -Unknown etiology, possibly medication effect -Currently stable, no reports of bleeding -Platelets 165 -Continue to monitor CBC  Facial swelling -Appears to have resolved, likely related to hypoalbuminemia  Constipation -Continue bowel regimen  Physical deconditioning -Compensated by patient's comorbidities as well as prolonged hospitalization.  -Physical therapy assessed the patient, recommending SNF -SW consulted for placement, however,  difficult to place at this time -Speech therapy assessed patient, recommended regular diet, thin liquids  Severe malnutrition  -in the context of chronic illness -nutrition consulted -continue feeding supplementation  DVT Prophylaxis  SCDs  Code Status: Full  Family Communication: Mother at bedside  Disposition Plan: Admitted. Dispo to SNF when stable and available  Consultants PCCM Infectious disease Interventional radiology General surgery Neurology Neurosurgery  Procedures  Lumbar puncture Lumbar drain placement on 5/28, discontinued 6/10 Intubation/extubation Cor Trak discontinued 01/10/2017 Lumbar puncture by interventional radiology on 01/25/2017 and 01/30/2017     Lab Results  Component Value Date   PLT 159 02/01/2017      Anti-infectives    Start     Dose/Rate Route Frequency Ordered Stop   01/26/17 1545  valACYclovir (VALTREX) tablet 1,000 mg     1,000 mg Oral 2 times daily 01/26/17 1535 01/29/17 0816   01/24/17 1100  fluconazole (DIFLUCAN) tablet 400 mg     400 mg Oral Daily 01/24/17 1059     01/19/17 1530  fluconazole (DIFLUCAN) IVPB 400 mg  Status:  Discontinued     400 mg 100 mL/hr over 120 Minutes Intravenous Every 24 hours 01/19/17 1442 01/24/17 1059   01/16/17 2200  sulfamethoxazole-trimethoprim (BACTRIM DS,SEPTRA DS) 800-160 MG per tablet 1 tablet     1 tablet Oral Daily 01/16/17 2035     01/13/17 1000  sulfamethoxazole-trimethoprim (BACTRIM,SEPTRA) 200-40 MG/5ML suspension 10 mL  Status:  Discontinued     10 mL Oral Daily 01/12/17 0952 01/12/17 1149   01/12/17 1300  amphotericin B liposome (AMBISOME) 240 mg in dextrose 5 % 500 mL IVPB  Status:  Discontinued     240 mg 250 mL/hr over 120 Minutes Intravenous Every 24 hours 01/12/17 1155 01/12/17 1203   01/12/17 1300  amphotericin B liposome (AMBISOME) 240 mg in dextrose 5 % 500 mL IVPB  Status:  Discontinued     240 mg 250 mL/hr over 120 Minutes Intravenous Every 24 hours 01/12/17 1203  01/19/17 1442  01/12/17 1200  flucytosine (ANCOBON) capsule 1,500 mg  Status:  Discontinued     1,500 mg Oral Every 6 hours 01/12/17 0952 01/19/17 1442   01/06/17 1000  azithromycin (ZITHROMAX) tablet 1,200 mg     1,200 mg Oral Weekly 01/04/17 2339     01/06/17 1000  amphotericin B liposome (AMBISOME) 240 mg in dextrose 5 % 500 mL IVPB  Status:  Discontinued     4 mg/kg  59.8 kg 250 mL/hr over 120 Minutes Intravenous Every 24 hours 01/06/17 0815 01/12/17 1155   12/31/16 1000  sulfamethoxazole-trimethoprim (BACTRIM,SEPTRA) 200-40 MG/5ML suspension 10 mL  Status:  Discontinued     10 mL Per Tube Daily 12/30/16 1418 01/12/17 0952   12/31/16 1000  amphotericin B liposome (AMBISOME) 270 mg in dextrose 5 % 500 mL IVPB  Status:  Discontinued     4 mg/kg  67.6 kg 250 mL/hr over 120 Minutes Intravenous Every 24 hours 12/30/16 1510 01/06/17 0815   12/30/16 1000  azithromycin (ZITHROMAX) 200 MG/5ML suspension 1,200 mg  Status:  Discontinued     1,200 mg Per Tube Every Fri 12/26/16 2030 01/04/17 2339   12/29/16 1000  sulfamethoxazole-trimethoprim (BACTRIM,SEPTRA) 200-40 MG/5ML suspension 20 mL  Status:  Discontinued     20 mL Per Tube Daily 12/28/16 1226 12/30/16 1418   12/28/16 1000  amphotericin B liposome (AMBISOME) 240 mg in dextrose 5 % 500 mL IVPB  Status:  Discontinued     3.5 mg/kg  67.6 kg 250 mL/hr over 120 Minutes Intravenous Every 24 hours 12/28/16 0853 12/30/16 1510   12/28/16 0900  amphotericin B liposome (AMBISOME) 240 mg in dextrose 5 % 500 mL IVPB  Status:  Discontinued     3.5 mg/kg  67.6 kg 250 mL/hr over 120 Minutes Intravenous Every 24 hours 12/28/16 0832 12/28/16 0853   12/27/16 0000  flucytosine (ANCOBON) capsule 1,500 mg  Status:  Discontinued     1,500 mg Per Tube Every 6 hours 12/26/16 1909 01/12/17 0952   12/26/16 2200  sulfamethoxazole-trimethoprim (BACTRIM DS,SEPTRA DS) 800-160 MG per tablet 1 tablet  Status:  Discontinued     1 tablet Per Tube Every 12 hours 12/26/16  1909 12/26/16 1912   12/26/16 2200  valACYclovir (VALTREX) tablet 1,000 mg     1,000 mg Per Tube 2 times daily 12/26/16 1909 01/01/17 2359   12/26/16 2200  sulfamethoxazole-trimethoprim (BACTRIM,SEPTRA) 200-40 MG/5ML suspension 20 mL  Status:  Discontinued     20 mL Per Tube Every 12 hours 12/26/16 1912 12/28/16 1226   12/23/16 2200  sulfamethoxazole-trimethoprim (BACTRIM DS,SEPTRA DS) 800-160 MG per tablet 1 tablet  Status:  Discontinued     1 tablet Oral Every 12 hours 12/23/16 1822 12/26/16 1909   12/23/16 1900  cefTRIAXone (ROCEPHIN) 2 g in dextrose 5 % 50 mL IVPB  Status:  Discontinued     2 g 100 mL/hr over 30 Minutes Intravenous Every 24 hours 12/23/16 1821 12/28/16 1122   12/23/16 0900  amphotericin B liposome (AMBISOME) 200 mg in dextrose 5 % 500 mL IVPB  Status:  Discontinued     200 mg 250 mL/hr over 120 Minutes Intravenous Every 24 hours 12/22/16 0919 12/28/16 0832   12/23/16 0800  azithromycin (ZITHROMAX) tablet 1,200 mg  Status:  Discontinued     1,200 mg Oral Weekly 12/22/16 0811 12/26/16 2029   12/22/16 2200  valACYclovir (VALTREX) tablet 1,000 mg  Status:  Discontinued     1,000 mg Oral 2 times daily 12/22/16 1611 12/26/16 1909  12/22/16 1800  amoxicillin-clavulanate (AUGMENTIN) 875-125 MG per tablet 1 tablet  Status:  Discontinued     1 tablet Oral 2 times daily with meals 12/22/16 1446 12/23/16 1821   12/22/16 1800  doxycycline (VIBRA-TABS) tablet 100 mg  Status:  Discontinued     100 mg Oral 2 times daily with meals 12/22/16 1446 12/23/16 1821   12/22/16 1200  flucytosine (ANCOBON) capsule 1,500 mg  Status:  Discontinued     1,500 mg Oral Every 6 hours 12/22/16 0614 12/26/16 1909   12/22/16 1000  azithromycin (ZITHROMAX) tablet 250 mg  Status:  Discontinued     250 mg Oral Daily 12/21/16 0224 12/21/16 1131   12/22/16 1000  doxycycline (VIBRA-TABS) tablet 100 mg  Status:  Discontinued     100 mg Oral Every 12 hours 12/22/16 0942 12/22/16 1446   12/22/16 0000   amphotericin B liposome (AMBISOME) 190 mg in dextrose 5 % 500 mL IVPB  Status:  Discontinued     3 mg/kg  64.2 kg 250 mL/hr over 120 Minutes Intravenous Every 24 hours 12/21/16 2313 12/22/16 0919   12/22/16 0000  flucytosine (ANCOBON) capsule 250 mg  Status:  Discontinued     250 mg Oral Every 6 hours 12/21/16 2314 12/21/16 2325   12/22/16 0000  flucytosine (ANCOBON) capsule 1,500 mg  Status:  Discontinued     1,500 mg Oral Every 6 hours 12/21/16 2326 12/22/16 0614   12/21/16 1645  cefTRIAXone (ROCEPHIN) 2 g in dextrose 5 % 50 mL IVPB  Status:  Discontinued     2 g 100 mL/hr over 30 Minutes Intravenous Every 24 hours 12/21/16 1539 12/22/16 1446   12/21/16 1645  metroNIDAZOLE (FLAGYL) IVPB 500 mg  Status:  Discontinued     500 mg 100 mL/hr over 60 Minutes Intravenous Every 8 hours 12/21/16 1539 12/21/16 2348   12/21/16 1600  cefTRIAXone (ROCEPHIN) 2 g in dextrose 5 % 50 mL IVPB  Status:  Discontinued     2 g 100 mL/hr over 30 Minutes Intravenous Every 24 hours 12/21/16 1131 12/21/16 1558   12/21/16 1400  vancomycin (VANCOCIN) IVPB 750 mg/150 ml premix  Status:  Discontinued     750 mg 150 mL/hr over 60 Minutes Intravenous Every 8 hours 12/21/16 0142 12/22/16 0942   12/21/16 1000  sulfamethoxazole-trimethoprim (BACTRIM DS,SEPTRA DS) 800-160 MG per tablet 1 tablet  Status:  Discontinued     1 tablet Oral Daily 12/21/16 0058 12/21/16 0100   12/21/16 1000  abacavir-dolutegravir-lamiVUDine (TRIUMEQ) 600-50-300 MG per tablet 1 tablet  Status:  Discontinued     1 tablet Oral Daily 12/21/16 0204 12/22/16 0903   12/21/16 1000  sulfamethoxazole-trimethoprim (BACTRIM DS,SEPTRA DS) 800-160 MG per tablet 1 tablet  Status:  Discontinued     1 tablet Oral Daily 12/21/16 0204 12/23/16 1822   12/21/16 0800  ceFEPIme (MAXIPIME) 2 g in dextrose 5 % 50 mL IVPB  Status:  Discontinued     2 g 100 mL/hr over 30 Minutes Intravenous Every 8 hours 12/21/16 0137 12/21/16 1131   12/21/16 0600   piperacillin-tazobactam (ZOSYN) IVPB 3.375 g  Status:  Discontinued     3.375 g 12.5 mL/hr over 240 Minutes Intravenous Every 8 hours 12/21/16 0058 12/21/16 0132   12/21/16 0200  metroNIDAZOLE (FLAGYL) IVPB 500 mg  Status:  Discontinued     500 mg 100 mL/hr over 60 Minutes Intravenous Every 8 hours 12/21/16 0137 12/22/16 1446   12/21/16 0145  ceFEPIme (MAXIPIME) 2 g in dextrose 5 %  50 mL IVPB     2 g 100 mL/hr over 30 Minutes Intravenous  Once 12/21/16 0137 12/21/16 0358   12/21/16 0145  vancomycin (VANCOCIN) IVPB 1000 mg/200 mL premix     1,000 mg 200 mL/hr over 60 Minutes Intravenous  Once 12/21/16 0142 12/21/16 0429   12/21/16 0130  piperacillin-tazobactam (ZOSYN) IVPB 3.375 g  Status:  Discontinued     3.375 g 100 mL/hr over 30 Minutes Intravenous  Once 12/21/16 0115 12/21/16 0132   12/21/16 0130  azithromycin (ZITHROMAX) powder 1 g     1 g Oral  Once 12/21/16 0125 12/21/16 0328        Objective:   Vitals:   01/31/17 0800 01/31/17 1427 01/31/17 2255 02/01/17 0648  BP: 98/76 (!) 100/56 103/63 103/67  Pulse: 79 82 88 78  Resp: 16 18 18 17   Temp:  98.5 F (36.9 C) 98.4 F (36.9 C) 98.5 F (36.9 C)  TempSrc:  Oral Oral Oral  SpO2: 100% 100% 100% 100%  Weight:      Height:        Wt Readings from Last 3 Encounters:  01/11/17 62 kg (136 lb 11 oz)  12/20/16 65.8 kg (145 lb)  12/17/16 65.8 kg (145 lb)     Intake/Output Summary (Last 24 hours) at 02/01/17 1041 Last data filed at 01/31/17 1500  Gross per 24 hour  Intake           845.83 ml  Output              575 ml  Net           270.83 ml     Physical Exam  Awake Alert, Oriented X 3, No new F.N deficits, Normal affect [place].AT,PERRAL Supple Neck,No JVD, No cervical lymphadenopathy appriciated.  Symmetrical Chest wall movement, Good air movement bilaterally, CTAB RRR,No Gallops,Rubs or new Murmurs, No Parasternal Heave +ve B.Sounds, Abd Soft, No tenderness, No organomegaly appriciated, No rebound - guarding or  rigidity. No Cyanosis, Clubbing or edema, No new Rash or bruise      Data Review:    CBC  Recent Labs Lab 01/28/17 0344 01/29/17 0645 01/29/17 1243 01/30/17 0603 01/31/17 0359 02/01/17 0652  WBC 2.4* 3.0*  --  2.9* 2.5* 2.6*  HGB 8.0* 7.6* 7.6* 7.9* 7.7* 7.7*  HCT 25.5* 24.7* 23.8* 25.3* 24.6* 24.5*  PLT 180 174  --  161 165 159  MCV 88.9 89.5  --  91.0 88.2 88.4  MCH 27.9 27.5  --  28.4 27.6 27.8  MCHC 31.4 30.8  --  31.2 31.3 31.4  RDW 17.2* 17.3*  --  17.4* 17.1* 16.9*    Chemistries   Recent Labs Lab 01/28/17 0344 01/29/17 0645 01/29/17 0646 01/30/17 0603 01/31/17 0359 02/01/17 0652  NA 132*  --  131* 134* 131* 131*  K 4.4  --  4.5 4.5 4.3 4.1  CL 103  --  101 106 103 103  CO2 23  --  24 22 21* 23  GLUCOSE 87  --  85 81 77 91  BUN 19  --  20 21* 19 19  CREATININE 1.05  --  1.07 1.03 0.94 1.06  CALCIUM 9.3  --  9.4 9.2 9.2 9.2  MG 1.6* 1.7  --  1.5* 1.9 1.7   ------------------------------------------------------------------------------------------------------------------ No results for input(s): CHOL, HDL, LDLCALC, TRIG, CHOLHDL, LDLDIRECT in the last 72 hours.  No results found for: HGBA1C ------------------------------------------------------------------------------------------------------------------ No results for input(s): TSH, T4TOTAL, T3FREE, THYROIDAB in  the last 72 hours.  Invalid input(s): FREET3 ------------------------------------------------------------------------------------------------------------------ No results for input(s): VITAMINB12, FOLATE, FERRITIN, TIBC, IRON, RETICCTPCT in the last 72 hours.  Coagulation profile No results for input(s): INR, PROTIME in the last 168 hours.  No results for input(s): DDIMER in the last 72 hours.  Cardiac Enzymes No results for input(s): CKMB, TROPONINI, MYOGLOBIN in the last 168 hours.  Invalid input(s):  CK ------------------------------------------------------------------------------------------------------------------ No results found for: BNP  Inpatient Medications  Scheduled Meds: . amantadine  100 mg Oral BID WC  . azithromycin  1,200 mg Oral Weekly  . feeding supplement (ENSURE ENLIVE)  237 mL Oral BID BM  . fluconazole  400 mg Oral Daily  . lidocaine (PF)  5 mL Intradermal Once  . magnesium oxide  400 mg Oral BID  . mouth rinse  15 mL Mouth Rinse BID  . multivitamin with minerals  1 tablet Oral Daily  . senna-docusate  2 tablet Oral BID  . sodium chloride flush  3 mL Intravenous Q12H  . sulfamethoxazole-trimethoprim  1 tablet Oral Daily  . tamsulosin  0.4 mg Oral Daily   Continuous Infusions: . sodium chloride 50 mL/hr at 01/31/17 2047   PRN Meds:.acetaminophen (TYLENOL) oral liquid 160 mg/5 mL, acetaminophen, docusate sodium, hydrALAZINE, HYDROcodone-acetaminophen, morphine injection, [DISCONTINUED] ondansetron **OR** ondansetron (ZOFRAN) IV, polyethylene glycol, RESOURCE THICKENUP CLEAR  Micro Results Recent Results (from the past 240 hour(s))  CSF culture     Status: None   Collection Time: 01/25/17  1:33 PM  Result Value Ref Range Status   Specimen Description CSF  Final   Special Requests NONE  Final   Gram Stain   Final    WBC PRESENT, PREDOMINANTLY MONONUCLEAR YEAST CYTOSPIN SMEAR CRITICAL RESULT CALLED TO, READ BACK BY AND VERIFIED WITH: Sharlene Dory RN, AT 1038 01/28/17 BY D. VANHOOK    Culture NO GROWTH 3 DAYS  Final   Report Status 01/29/2017 FINAL  Final  Fungus Culture With Stain     Status: None (Preliminary result)   Collection Time: 01/25/17  1:33 PM  Result Value Ref Range Status   Fungus Stain Final report  Final    Comment: (NOTE) Performed At: [facility] [place], [place] [id number] Lindon Romp MD [id number]    Fungus (Mycology) Culture PENDING  Incomplete   Fungal Source CSF  Final  Fungus Culture Result      Status: None   Collection Time: 01/25/17  1:33 PM  Result Value Ref Range Status   Result 1 Comment  Final    Comment: (NOTE) KOH/Calcofluor preparation:  no fungus observed. Performed At: [facility] [place], [place] [id number] Lindon Romp MD [id number]   CSF culture     Status: None (Preliminary result)   Collection Time: 01/30/17  3:54 PM  Result Value Ref Range Status   Specimen Description CSF  Final   Special Requests NONE  Final   Gram Stain   Final    WBC PRESENT, PREDOMINANTLY MONONUCLEAR ENCAPSULATED YEAST SEEN CYTOSPIN SMEAR    Culture NO GROWTH 2 DAYS  Final   Report Status PENDING  Incomplete    Radiology Reports Ct Head Wo Contrast  Result Date: 01/06/2017 CLINICAL DATA:  Worsening responsiveness.  Meningitis. EXAM: CT HEAD WITHOUT CONTRAST TECHNIQUE: Contiguous axial images were obtained from the base of the skull through the vertex without intravenous contrast. COMPARISON:  MRI 12/27/2016. CT 12/27/2016. Multiple previous MR and CT exams. FINDINGS: Brain: Since the previous exams, the patient  has developed low level hyperdensity within the caudate and putamen on the left and at the left frontoparietal vertex. These findings probably represent petechial bleeding within areas previously shown to be affected by acute infarction. No evidence of macro hemorrhage/hematoma. Other areas of deep brain and cerebral hemispheric infarction are an apparent by CT. No sign of shift, hydrocephalus or extra-axial fluid collection. Vascular: Negative Skull: Negative Sinuses/Orbits: Clear/normal Other: None IMPRESSION: Development of low level hyperdensity in the left caudate, left putamen and at the left posterior frontal vertex, most consistent with petechial hemorrhage within areas of previously demonstrated infarction. No macro hemorrhage/hematoma. No new insult identified otherwise. No mass effect or hydrocephalus. Electronically Signed   By: Nelson Chimes  M.D.   On: 01/06/2017 13:04   Dg Chest Port 1 View  Result Date: 01/08/2017 CLINICAL DATA:  HIV disease with meningitis. EXAM: PORTABLE CHEST 1 VIEW COMPARISON:  January 07, 2017 FINDINGS: Feeding tube tip is below the diaphragm. Contrast is seen in the stomach. There is no edema or consolidation. Heart is slightly enlarged with pulmonary vascularity within normal limits. No adenopathy. No bone lesions. IMPRESSION: Stable cardiac prominence. No edema or consolidation. Feeding tube tip below diaphragm. Electronically Signed   By: Lowella Grip III M.D.   On: 01/08/2017 07:26   Dg Chest Port 1 View  Result Date: 01/07/2017 CLINICAL DATA:  Respiratory failure.  HIV positive EXAM: PORTABLE CHEST 1 VIEW COMPARISON:  January 02, 2017 FINDINGS: Endotracheal tube and nasogastric tube have been removed. No pneumothorax. There is no edema or consolidation. Heart is slightly enlarged with pulmonary vascularity within normal limits. No adenopathy. No bone lesions. IMPRESSION: No pneumothorax. Mild cardiac enlargement. No edema or consolidation. Electronically Signed   By: Lowella Grip III M.D.   On: 01/07/2017 07:56   Dg Abd Portable 1v  Result Date: 01/06/2017 CLINICAL DATA:  Initial evaluation for bowel trouble, perirectal abscess, history of HIV EXAM: PORTABLE ABDOMEN - 1 VIEW COMPARISON:  Prior CT from 12/20/2016. FINDINGS: Bowel gas pattern within normal limits without obstruction or ileus. No abnormal bowel wall thickening. No appreciable free air on this limited supine view the abdomen. Catheter tubing overlies the lower mid abdomen. No soft tissue mass or abnormal calcification. Visualized lung bases are clear. Visualized osseous structures unremarkable. IMPRESSION: Nonobstructive bowel gas pattern with no radiographic evidence for acute intra-abdominal process. Electronically Signed   By: Jeannine Boga M.D.   On: 01/06/2017 22:22   Dg Swallowing Func-speech Pathology  Result Date:  01/10/2017 Objective Swallowing Evaluation: Type of Study: MBS-Modified Barium Swallow Study Patient Details Name: Ej Pinson MRN: [id number] Date of Birth: 06-14-1986 Today's Date: 01/10/2017 Time: SLP Start Time (ACUTE ONLY): 1035-SLP Stop Time (ACUTE ONLY): 1100 SLP Time Calculation (min) (ACUTE ONLY): 25 min Past Medical History: Past Medical History: Diagnosis Date . HIV (human immunodeficiency virus infection) ([place])  . Perirectal abscess  Past Surgical History: Past Surgical History: Procedure Laterality Date . INCISION AND DRAINAGE PERIRECTAL ABSCESS   . INCISION AND DRAINAGE PERIRECTAL ABSCESS N/A 12/21/2016  Procedure: IRRIGATION AND DEBRIDEMENT PERIRECTAL ABSCESS;  Surgeon: Ralene Ok, MD;  Location: [place];  Service: General;  Laterality: N/A; . PLACEMENT OF LUMBAR DRAIN N/A 12/27/2016  Procedure: PLACEMENT OF LUMBAR DRAIN;  Surgeon: Consuella Lose, MD;  Location: [place];  Service: Neurosurgery;  Laterality: N/A; HPI: Patient is a 31 y/o male presents with sepsis secondary to perirectal abscess s/p I&D 5/23 and disseminated cryptococcal infection including meningitis, bacteremia, and likely pneumonia. Intubated 5/28-6/5. MRI-restricted diffusion in  the basal ganglia bilaterally with mild leptomeningeal enhancement. Second MRI- extension of the diffusion restriction in the left basal ganglia and right basal ganglia. s/p Lumbar drain 5/30. 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 Inconsistent swallow trigger noted, with occasional penetration above the cords before and during the swallow. This penetrate, combined with mild to trace vallecular and lateral channeal residuals do result in trace coating of the  traceheal wall post swallow (mixed with saliva) without sensation. The pt is able to complete multiple swallows to clear most residue spontaneously, but a cued intermittent throat clear is also beneficial to clear airway. Overall the pt is likely to experience trace aspiration events, but anticipate improvment in function with use of swallow mechanism and traning for compensatory strategies. Recommend pt also incorporate respiratory muscle strength training to improve pharyngeal musculature and force of expectoration. Will initiate a dys 1 (puree) diet with honey thick liquids with potential for upgrade, particularly of solids as pt demosntrates tolerance and consistency with strategies.  SLP Visit Diagnosis Dysphagia, oropharyngeal phase (R13.12) Attention and concentration deficit following -- Frontal lobe and executive function deficit following -- Impact on safety and function Moderate aspiration risk   CHL IP TREATMENT RECOMMENDATION 01/07/2017 Treatment Recommendations Therapy as outlined in treatment plan below   Prognosis 01/07/2017 Prognosis for Safe Diet Advancement Good Barriers to Reach Goals Cognitive deficits Barriers/Prognosis Comment -- CHL IP DIET RECOMMENDATION 01/10/2017 SLP Diet Recommendations Dysphagia 1 (Puree) solids;Honey thick liquids Liquid Administration via Cup;Spoon Medication Administration Crushed with puree Compensations Minimize environmental distractions;Slow rate;Small sips/bites;Multiple dry swallows after each bite/sip;Clear throat intermittently;Effortful swallow Postural Changes --   CHL IP OTHER RECOMMENDATIONS 01/10/2017 Recommended Consults -- Oral Care Recommendations Oral care BID Other Recommendations --   CHL IP FOLLOW UP RECOMMENDATIONS 01/10/2017 Follow up Recommendations Inpatient Rehab   CHL IP FREQUENCY AND DURATION 01/10/2017 Speech Therapy Frequency (ACUTE ONLY) min 3x week Treatment Duration 2 weeks      CHL IP ORAL PHASE 01/10/2017 Oral Phase WFL Oral - Pudding Teaspoon  -- Oral - Pudding Cup -- Oral - Honey Teaspoon -- Oral - Honey Cup -- Oral - Nectar Teaspoon -- Oral - Nectar Cup -- Oral - Nectar Straw -- Oral - Thin Teaspoon -- Oral - Thin Cup -- Oral - Thin Straw -- Oral - Puree -- Oral - Mech Soft Impaired mastication;Weak lingual manipulation Oral - Regular -- Oral - Multi-Consistency -- Oral - Pill -- Oral Phase - Comment --  CHL IP PHARYNGEAL PHASE 01/10/2017 Pharyngeal Phase Impaired Pharyngeal- Pudding Teaspoon -- Pharyngeal -- Pharyngeal- Pudding Cup -- Pharyngeal -- Pharyngeal- Honey Teaspoon Penetration/Aspiration during swallow;Penetration/Aspiration before swallow;Reduced epiglottic inversion;Reduced tongue base retraction;Pharyngeal residue - valleculae;Pharyngeal residue - pyriform;Delayed swallow initiation-pyriform sinuses Pharyngeal Material enters airway, remains ABOVE vocal cords and not ejected out;Material does not enter airway;Material enters airway, CONTACTS cords and not ejected out Pharyngeal- Honey Cup NT Pharyngeal -- Pharyngeal- Nectar Teaspoon Penetration/Aspiration during swallow;Penetration/Aspiration before swallow;Reduced epiglottic inversion;Reduced tongue base retraction;Pharyngeal residue - valleculae;Pharyngeal residue - pyriform;Delayed swallow initiation-pyriform sinuses Pharyngeal Material enters airway, CONTACTS cords and not ejected out;Material enters airway, passes BELOW cords and not ejected out despite cough attempt by patient;Material does not enter airway;Material enters airway, remains ABOVE vocal cords and not ejected out Pharyngeal- Nectar Cup Penetration/Aspiration during swallow;Penetration/Aspiration  before swallow;Reduced epiglottic inversion;Reduced tongue base retraction;Pharyngeal residue - valleculae;Pharyngeal residue - pyriform;Delayed swallow initiation-pyriform sinuses Pharyngeal Material enters airway, CONTACTS cords and not ejected out;Material enters airway, passes BELOW cords and not ejected out despite cough  attempt by patient;Material does not enter airway;Material enters airway, remains ABOVE vocal cords and not ejected out Pharyngeal- Nectar Straw Penetration/Aspiration during swallow;Penetration/Aspiration before swallow;Reduced epiglottic inversion;Reduced tongue base retraction;Pharyngeal residue - valleculae;Pharyngeal residue - pyriform;Delayed swallow initiation-pyriform sinuses Pharyngeal Material enters airway, CONTACTS cords and not ejected out;Material enters airway, passes BELOW cords and not ejected out despite cough attempt by patient;Material does not enter airway;Material enters airway, remains ABOVE vocal cords and not ejected out Pharyngeal- Thin Teaspoon -- Pharyngeal -- Pharyngeal- Thin Cup Penetration/Aspiration during swallow;Penetration/Aspiration before swallow;Reduced epiglottic inversion;Reduced tongue base retraction;Pharyngeal residue - valleculae;Pharyngeal residue - pyriform;Delayed swallow initiation-pyriform sinuses Pharyngeal Material enters airway, CONTACTS cords and not ejected out;Material enters airway, passes BELOW cords and not ejected out despite cough attempt by patient;Material does not enter airway;Material enters airway, remains ABOVE vocal cords and not ejected out Pharyngeal- Thin Straw Penetration/Aspiration during swallow;Penetration/Aspiration before swallow;Reduced epiglottic inversion;Reduced tongue base retraction;Pharyngeal residue - valleculae;Pharyngeal residue - pyriform;Delayed swallow initiation-pyriform sinuses Pharyngeal Material enters airway, CONTACTS cords and not ejected out;Material enters airway, passes BELOW cords and not ejected out despite cough attempt by patient;Material does not enter airway;Material enters airway, remains ABOVE vocal cords and not ejected out Pharyngeal- Puree Reduced epiglottic inversion;Reduced tongue base retraction;Pharyngeal residue - valleculae;Pharyngeal residue - pyriform;Delayed swallow initiation-pyriform sinuses  Pharyngeal Material enters airway, remains ABOVE vocal cords and not ejected out;Material enters airway, remains ABOVE vocal cords then ejected out;Material does not enter airway Pharyngeal- Mechanical Soft Reduced epiglottic inversion;Reduced tongue base retraction;Pharyngeal residue - valleculae;Pharyngeal residue - pyriform;Delayed swallow initiation-pyriform sinuses Pharyngeal -- Pharyngeal- Regular -- Pharyngeal -- Pharyngeal- Multi-consistency -- Pharyngeal -- Pharyngeal- Pill -- Pharyngeal -- Pharyngeal Comment --  No flowsheet data found. No flowsheet data found. [name], MA CCC-SLP [phone] [name] 01/10/2017, 11:22 AM              Dg Cyndy Freeze Guide Lumbar Puncture  Result Date: 01/30/2017 CLINICAL DATA:  HIV. EXAM: DIAGNOSTIC LUMBAR PUNCTURE UNDER FLUOROSCOPIC GUIDANCE FLUOROSCOPY TIME:  Fluoroscopy Time:  1 minutes and 12 seconds Radiation Exposure Index (if provided by the fluoroscopic device): Not applicable. Number of Acquired Spot Images: 1 PROCEDURE: Informed consent was obtained from the patient's mother prior to the procedure, including potential complications of headache, allergy, and pain. With the patient prone, the lower back was prepped with Betadine. 1% Lidocaine was used for local anesthesia. Lumbar puncture was performed at the L2-3 level using a 20 gauge needle with return of clear CSF with an opening pressure of 9 cm water. 3.5 ml of CSF were obtained for laboratory studies. The patient tolerated the procedure well and there were no apparent complications. Further fluid could not be obtained secondary to patient's clinical status and low pressure. Closing pressure was not performed secondary to low pressure throughout. IMPRESSION: Non complicated lumbar puncture as detailed above. Electronically Signed   By: Abigail Miyamoto M.D.   On: 01/30/2017 16:26   Dg Fluoro Guide Lumbar Puncture  Result Date: 01/25/2017 Etheleen Mayhew, MD     01/25/2017  1:46 PM Lumbar  puncture performed at L2-L3.  Opening pressure 22 cm H20.  22 mL clear CSF obtained and sent to lab for testing.  Please see full dictation in PACS for additional details.    Time Spent in minutes  [place] M.D on 02/01/2017 at 10:41 AM  Between 7am to 7pm - Pager - [phone]  After 7pm go to [email or web address] - password [email or web address]  [facility] -  Office  [phone]

## 2017-02-01 NOTE — Progress Notes (Signed)
Occupational Therapy Treatment Patient Details Name: William Pena MRN: [id number] DOB: February 24, 1986 Today's Date: 02/01/2017    History of present illness Patient is a 31 y/o male presents with sepsis secondary to perirectal abscess s/p I&D 5/23 and disseminated cryptococcal infection including meningitis, bacteremia, and likely pneumonia. Intubated 5/28-6/5. Pt found to have acute ischemic strokes. 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 Second MRI- extension of the diffusion restriction in the left basal ganglia and right basal ganglia. s/p Lumbar drain 5/30. PMH includes advanced hiv disease   OT comments  Pt non verbal this visit. Continues to have a flat affect and require multimodal cues to follow commands. 3 person assist required to attempt partial stand with stedy. Pt continues to require max assist for sitting balance. Performed ROM R UE and prolonged stretch of elbow with soft splint. Using foam built up spoon with R hand to self feed. Progress is minimal.  Follow Up Recommendations  SNF    Equipment Recommendations  None recommended by OT    Recommendations for Other Services      Precautions / Restrictions Precautions Precautions: Fall Required Braces or Orthoses: Other Brace/Splint (R elbow soft splint)       Mobility Bed Mobility Overal bed mobility: Needs Assistance Bed Mobility: Rolling;Sidelying to Sit Rolling: Max assist Sidelying to sit: Max assist;+2 for physical assistance       General bed mobility comments: assist for all aspects, pt initiating sitting up only  Transfers Overall transfer level: Needs assistance Equipment used:  (stedy) Transfers: Sit to/from Stand Sit to Stand: Max assist (+3)         General transfer comment: total assist to transfer with stedy    Balance Overall balance assessment: Needs assistance Sitting-balance support: Feet supported Sitting balance-Leahy Scale:  Poor Sitting balance - Comments: max assist, R lean, min assist with hands on rail of stedy Postural control: Posterior lean;Right lateral lean   Standing balance-Leahy Scale: Zero Standing balance comment: attempted with stedy, unable to achieve fully upright                           ADL either performed or assessed with clinical judgement   ADL     Eating/Feeding Details (indicate cue type and reason): pt evidently using foam build up, was soiled                                 Functional mobility during ADLs: Total assistance;+2 for physical assistance;+2 for safety/equipment (with stedy)       Vision   Additional Comments: pt locating items in R hemispace on tray, following people toward the R and glancing out window   Perception     Praxis      Cognition Arousal/Alertness: Awake/alert Behavior During Therapy: Flat affect Overall Cognitive Status: Impaired/Different from baseline Area of Impairment: Following commands;Problem solving                       Following Commands: Follows one step commands inconsistently (with multimodal cues)     Problem Solving: Slow processing;Decreased initiation;Difficulty sequencing;Requires verbal cues;Requires tactile cues General Comments: pt not verbalizing, only nodding head in response to yes/no questions        Exercises Exercises: General Upper Extremity General Exercises - Upper Extremity Shoulder Flexion: Both;AAROM;Seated;10 reps Elbow Extension: PROM;10 reps;AAROM;Right;Seated Other Exercises Other Exercises: R  soft elbow splint placed, tolerated well, RN aware it is not a restraint   Shoulder Instructions       General Comments      Pertinent Vitals/ Pain       Pain Assessment: Faces Faces Pain Scale: Hurts a little bit Pain Location: buttocks Pain Descriptors / Indicators: Sore Pain Intervention(s): Monitored during session;Repositioned  Home Living                                           Prior Functioning/Environment              Frequency  Min 3X/week        Progress Toward Goals  OT Goals(current goals can now be found in the care plan section)  Progress towards OT goals: Progressing toward goals  Acute Rehab OT Goals Patient Stated Goal: did not state OT Goal Formulation: Patient unable to participate in goal setting Time For Goal Achievement: 02/13/17 Potential to Achieve Goals: [place] Discharge plan remains appropriate    Co-evaluation    PT/OT/SLP Co-Evaluation/Treatment: Yes Reason for Co-Treatment: For patient/therapist safety   OT goals addressed during session: Strengthening/ROM;Proper use of Adaptive equipment and DME      AM-PAC PT "6 Clicks" Daily Activity     Outcome Measure   Help from another person eating meals?: A Little Help from another person taking care of personal grooming?: A Lot Help from another person toileting, which includes using toliet, bedpan, or urinal?: Total Help from another person bathing (including washing, rinsing, drying)?: Total Help from another person to put on and taking off regular upper body clothing?: Total Help from another person to put on and taking off regular lower body clothing?: Total 6 Click Score: 9    End of Session    OT Visit Diagnosis: Cognitive communication deficit (R41.841);Apraxia (R48.2);Muscle weakness (generalized) (M62.81) Symptoms and signs involving cognitive functions: Cerebral infarction   Activity Tolerance Patient tolerated treatment well   Patient Left in chair;with call bell/phone within reach;with chair alarm set   Nurse Communication Need for lift equipment (May remove soft elbow splint for meals)        Time: 1459-1535 OT Time Calculation (min): 36 min  Charges: OT General Charges $OT Visit: 1 Procedure OT Treatments $Therapeutic Activity: 8-22 mins    Malka So 02/01/2017, 3:52 PM   [phone]

## 2017-02-02 DIAGNOSIS — G039 Meningitis, unspecified: Secondary | ICD-10-CM

## 2017-02-02 LAB — RENAL FUNCTION PANEL
Albumin: 2.6 g/dL — ABNORMAL LOW (ref 3.5–5.0)
Anion gap: 6 (ref 5–15)
BUN: 18 mg/dL (ref 6–20)
CHLORIDE: 106 mmol/L (ref 101–111)
CO2: 22 mmol/L (ref 22–32)
CREATININE: 1.02 mg/dL (ref 0.61–1.24)
Calcium: 9.1 mg/dL (ref 8.9–10.3)
Glucose, Bld: 86 mg/dL (ref 65–99)
POTASSIUM: 4 mmol/L (ref 3.5–5.1)
Phosphorus: 4.9 mg/dL — ABNORMAL HIGH (ref 2.5–4.6)
Sodium: 134 mmol/L — ABNORMAL LOW (ref 135–145)

## 2017-02-02 LAB — MAGNESIUM: Magnesium: 1.5 mg/dL — ABNORMAL LOW (ref 1.7–2.4)

## 2017-02-02 MED ORDER — PRO-STAT SUGAR FREE PO LIQD
30.0000 mL | Freq: Every day | ORAL | Status: DC
  Administered 2017-02-02 – 2017-02-04 (×3): 30 mL via ORAL
  Filled 2017-02-02 (×4): qty 30

## 2017-02-02 NOTE — Progress Notes (Signed)
Nutrition Follow-up  DOCUMENTATION CODES:   Severe malnutrition in context of acute illness/injury  INTERVENTION:   -Continue Ensure Enlive po BID, each supplement provides 350 kcal and 20 grams of protein -Continue MVI daily -Continue snacks TID -30 ml Prostat daily, each supplement provides 100 kcals and 15 grams protein  NUTRITION DIAGNOSIS:   Malnutrition (Severe) related to acute illness (cryptococcal meningitis) as evidenced by percent weight loss, energy intake < 75% for > or equal to 1 month.  Ongoing  GOAL:   Patient will meet greater than or equal to 90% of their needs  Progressing  MONITOR:   PO intake, Supplement acceptance, Labs, Weight trends, Skin, I & O's  REASON FOR ASSESSMENT:   Consult Calorie Count  ASSESSMENT:   Pt with PMH of HIV/AIDS (hx of noncompliance in part due to incarceration) and chronic perirectal abscess which pt had I&D Dec 2017, Jan 2018 but has worsened since being in jail for the last 6 weeks also without HIV meds during this time. Pt admitted with perirectal abscess had repeat I&D 5/23. 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 Lumbar drain placed 5/29.    Reviewed ID note from 02/02/17, plan to continues fluconazole for 4 more weeks and start antiretroviral therapy.   Case discussed with RN, who reports pt is more alert. Pt mother still assists with feeding, however, pt has also started using adaptive utensils to self-feed intermittently. Documented meal completion 25-50%. Pt consumed about 30% of breakfast. Pt mom endorses that pt has been drinking Ensure.   Reviewed CSW note; still working on SNF placement.   Labs reviewed: Na: 134 (on IV supplementation), Phos: 4.9, Mg: 1.5.  Diet Order:  Diet regular Room service appropriate? Yes; Fluid consistency: Thin; Fluid restriction: 1500 mL Fluid  Skin:  Wound (see comment) (st 2 sacrum, open buttock wound, closed back and perineum )  Last BM:   02/02/17  Height:   Ht Readings from Last 1 Encounters:  12/27/16 5\' 6"  (1.676 m)    Weight:   Wt Readings from Last 1 Encounters:  01/11/17 136 lb 11 oz (62 kg)    Ideal Body Weight:  59 kg  BMI:  Body mass index is 22.06 kg/m.  Estimated Nutritional Needs:   Kcal:  1800-2000  Protein:  90-110 grams  Fluid:  > 1.8 L/day  EDUCATION NEEDS:   No education needs identified at this time  Cassadee Vanzandt A. Jimmye Norman, RD, LDN, CDE Pager: [phone] After hours Pager: [phone]

## 2017-02-02 NOTE — Progress Notes (Signed)
Patient ID: William Pena, male   DOB: 1986/07/04, 31 y.o.   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 His mother agrees that he is getting a little bit better.  PLAN: 1. Continue current dose of fluconazole for 4 more weeks and start antiretroviral therapy.  Principal Problem:   Perirectal abscess Active Problems:   Chronic pain   AIDS (acquired immune deficiency syndrome) (HCC)   Hyponatremia   Normocytic anemia   Elevated LFTs   Pulmonary infiltrates   Hypomagnesemia   Encephalopathy acute   SIRS (systemic inflammatory response syndrome) (HCC)   Acute respiratory failure (HCC)   Rectal abscess   Cryptococcal meningoencephalitis (HCC)   Disseminated cryptococcosis (HCC)   Cavitary pneumonia   Diffuse lymphadenopathy   Drug rash   Elevated intracranial pressure   HIV disease (HCC)   Lymphadenopathy of head and neck   HIV (human immunodeficiency virus infection) (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 amantadine  100 mg Oral BID WC  . azithromycin  1,200 mg Oral Weekly  . feeding supplement (ENSURE ENLIVE)  237 mL Oral BID BM  . fluconazole  400 mg Oral Daily  . lidocaine (PF)  5 mL Intradermal Once  . magnesium oxide  400 mg Oral BID  . mouth rinse  15 mL Mouth Rinse BID  . multivitamin with minerals  1 tablet Oral Daily  . senna-docusate  2 tablet Oral BID  . sodium chloride flush  3 mL Intravenous Q12H  . sulfamethoxazole-trimethoprim  1 tablet Oral Daily  . tamsulosin  0.4 mg Oral Daily    SUBJECTIVE: William Pena tells me that he is  feeling all right.  Review of Systems: Review of Systems  Unable to perform ROS: Mental acuity    Allergies  Allergen Reactions  . Carrot Oil Swelling    "throat swell"   . Augmentin [Amoxicillin-Pot Clavulanate]     "break out"    OBJECTIVE: Vitals:   02/01/17 1622 02/01/17 1846 02/01/17 2126 02/02/17 0645  BP: 112/70  134/70 (!) 111/54  Pulse: 99  84 79  Resp: 20  16 16   Temp:  98.6 F (37 C) 98.4 F (36.9 C) 98.8 F (37.1 C)  TempSrc:  Oral Oral   SpO2: 100%  100% 100%  Weight:      Height:       Body mass index is 22.06 kg/m.  Physical Exam  Constitutional:  [name] sitting up in bed feeding himself cereal. His mother is at the bedside.  Eyes: Conjunctivae are normal.  Neck: Neck supple.  Cardiovascular: Normal rate and regular rhythm.   No murmur heard. Pulmonary/Chest: Effort normal and breath sounds normal.  Abdominal: Soft. There is no tenderness.  Neurological: [name] alert.  Skin: No rash noted.    Lab Results Lab Results  Component Value Date   WBC 2.6 (L) 02/01/2017   HGB 7.7 (L) 02/01/2017   HCT 24.5 (L) 02/01/2017   MCV 88.4 02/01/2017   PLT  159 02/01/2017    Lab Results  Component Value Date   CREATININE 1.02 02/02/2017   BUN 18 02/02/2017   NA 134 (L) 02/02/2017   K 4.0 02/02/2017   CL 106 02/02/2017   CO2 22 02/02/2017    Lab Results  Component Value Date   ALT 41 01/20/2017   AST 45 (H) 01/20/2017   ALKPHOS 63 01/20/2017   BILITOT 0.5 01/20/2017     Microbiology: Recent Results (from the past 240 hour(s))  CSF culture     Status: None   Collection Time: 01/25/17  1:33 PM  Result Value Ref Range Status   Specimen Description CSF  Final   Special Requests NONE  Final   Gram Stain   Final    WBC PRESENT, PREDOMINANTLY MONONUCLEAR YEAST CYTOSPIN SMEAR CRITICAL RESULT CALLED TO, READ BACK BY AND VERIFIED WITH: Sharlene Dory RN, AT 1038 01/28/17 BY D. VANHOOK    Culture NO GROWTH 3 DAYS  Final   Report Status 01/29/2017 FINAL   Final  Fungus Culture With Stain     Status: None (Preliminary result)   Collection Time: 01/25/17  1:33 PM  Result Value Ref Range Status   Fungus Stain Final report  Final    Comment: (NOTE) Performed At: [facility] [place], [place] [id number] Lindon Romp MD [id number]    Fungus (Mycology) Culture PENDING  Incomplete   Fungal Source CSF  Final  Fungus Culture Result     Status: None   Collection Time: 01/25/17  1:33 PM  Result Value Ref Range Status   Result 1 Comment  Final    Comment: (NOTE) KOH/Calcofluor preparation:  no fungus observed. Performed At: [facility] [place], [place] [id number] Lindon Romp MD [id number]   CSF culture     Status: None (Preliminary result)   Collection Time: 01/30/17  3:54 PM  Result Value Ref Range Status   Specimen Description CSF  Final   Special Requests NONE  Final   Gram Stain   Final    WBC PRESENT, PREDOMINANTLY MONONUCLEAR ENCAPSULATED YEAST SEEN CYTOSPIN SMEAR    Culture NO GROWTH 3 DAYS  Final   Report Status PENDING  Incomplete  Fungus Culture With Stain     Status: None (Preliminary result)   Collection Time: 01/30/17  3:54 PM  Result Value Ref Range Status   Fungus Stain Final report  Final    Comment: (NOTE) Performed At: [facility] [place], [place] [id number] Lindon Romp MD [id number]    Fungus (Mycology) Culture PENDING  Incomplete   Fungal Source CSF  Final  Fungus Culture Result     Status: None   Collection Time: 01/30/17  3:54 PM  Result Value Ref Range Status   Result 1 Comment  Final    Comment: (NOTE) KOH/Calcofluor preparation:  no fungus observed. Performed At: [facility] [address] [place], [place] [id number] [place] [id number]     Michel Bickers, MD [place] [facility] [phone] pager   [phone] cell 02/02/2017, 11:39 AM

## 2017-02-02 NOTE — Progress Notes (Signed)
CSW spoke with patient's mother and explained that we do not have a bed available for placement yet but that we will follow up with her once we hear back. She reported being grateful for our help and care of the patient.  Percell Locus Frankey Botting LCSWA [phone]

## 2017-02-02 NOTE — Progress Notes (Signed)
Patient ID: William Pena, male   DOB: 1985-12-29, 31 y.o.   MRN: [id number]                                                                PROGRESS NOTE                                                                                                                                                                                                             Patient Demographics:    William Pena, is a 31 y.o. male, DOB - 1986-03-31, EXB:[id number]  Admit date - 12/20/2016   Admitting Physician Modena Jansky, MD  Outpatient Primary MD for the patient is [name], [name], MD  LOS - [phone]  Outpatient Specialists:     Chief Complaint  Patient presents with  . Rectal Bleeding  . Generalized Body Aches       Brief Narrative  HPI on 12/20/2016 by Dr. Stark Klein (surgery) Pt is a 31 yo M with history of recurrent perirectal abscesses. He was treated at [facility] with surgery in January by Dr. Ronita Hipps, but now is incarcerated in [place]. He has had multiple different types of drains, but currently just has a draining seton in place at the site of a fistula. He has been to the ED 3 times in the last week. Due to his current incarceration in [facility], he cannot be transferred to [place]. He reports severe rectal pain. He has had fever to 103 and had sepsis workup this AM at [facility] long. He did have a CT on 5/19 that showed small abscess and antibiotics were recommended. He was not able to be compliant with that for unclear reasons. He has also had abdominal pain. [name] also seen at [facility] for his AIDS by Dr. Tobie Poet. [name] frequently homeless as well. [name] reportedly on his HIV meds. Last CD 4 count looks like 1 /microliter (10/04/2016) and last viral load looks like 50,936/mL (08/08/2016).  Interim history During admission he was also noted to have headache, fever, chills, night sweats, neck pain and lymphadenopathy. ID was consulted and recommended imaging of chest, LP and cultures be  sent from perirectal I&D. Also his serum cryptococcal antigen titer was elevated raising the possibility of disseminated cryptococcal infection including lung involvement. Pulmonology was  consulted by ID for bronchoscopy and washings in an effort to help determine if his CT changes reflect TB or possible pulmonary cryptococcal disease. Initially unable to perform bronchoscopy due to hypoxia. 5/28 patient had altered LOC, became very lethargic/obtunded requiring CT and LP, complicated by acute respiratory failure due to altered mental status that required mechanical ventilation. He had a lumbar CSF drain to depression arise. Now extubated, drain removed. Failed swallowing evaluation and has Cor trak forte tube feeds. Mental status slightly better but waxing and waning. ID, neurology, neurosurgery and CCM consulted during hospital course. Care transferred to stepdown and [place] on 01/09/17. MS slowly improving. Cor Trak discontinued 6/12 and diet initiated. Transferred to medical bed 6/13.Acute kidney injury on 6/14 (creatinine 2.8), likely due to acute urinary retention & Amphotericin> resolved. Antimicrobials per ID    Subjective:    William Pena today is more conversant, per CNA seems more active. Able to assist in turning better.  , No headache, No chest pain, No abdominal pain - No Nausea, No new weakness tingling or numbness, No Cough - SOB.    Assessment  & Plan :    Principal Problem:   Perirectal abscess Active Problems:   Chronic pain   AIDS (acquired immune deficiency syndrome) (HCC)   Hyponatremia   Normocytic anemia   Elevated LFTs   Pulmonary infiltrates   Hypomagnesemia   Encephalopathy acute   SIRS (systemic inflammatory response syndrome) (HCC)   Acute respiratory failure (HCC)   Rectal abscess   Cryptococcal meningoencephalitis (HCC)   Disseminated cryptococcosis (HCC)   Cavitary pneumonia   Diffuse lymphadenopathy   Drug rash   Elevated intracranial pressure   HIV  disease (HCC)   Lymphadenopathy of head and neck   HIV (human immunodeficiency virus infection) ([place])   Meningitis   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 Lumbar puncture done on 01/20/2017 showed opening pressure of 31, better than before, it was 55 -CSF gram stain showed yeast -Discussed with infectious disease, Dr. Baxter Flattery. Patient will need repeat lumbar puncture as he did have an elevated opening pressure on previous lumbar puncture. -Interventional radiology consult appreciated, status post lumbar puncture on 01/25/2017. Opening pressure 22. CSF obtained and sent to the lab, results pending. -Discussed with Dr. Baxter Flattery, since patient is currently asymptomatic and alert, will defer VP shunt for now -s/p LP on 01/30/2017 by IR, with opening pressure 9cm. 3.5cc obtained.   CSF culture 6/22=> ngtd CSF culture 6/27=> ngtd CSF culture 7/2=> ngtd  No antiretroviral x4 weeks per ID Appreciate ID input  Acute urinary retention -Continue Foley catheter and Flomax -Patient failed voiding trial twice will try to remove tomorrow  Acute kidney injury -Likely secondary to urinary retention and possibly amphotericin -Creatinine peaked to 2.84, currently 0.94 -Continue to monitor BMP  Hyponatremia Stable  Severe Protein Calorie Malnutrition prostat  Acute hypoxic respiratory failure secondary cryptococcal pneumonia -Required intubation, was extubated on 01/03/2017 -Patient currently  on room air  Acute encephalopathy -Likely multi-factorial including cryptococcal meningitis, acute strokes, metabolic arrangements, medication versus respiratory etiology   -Has been improving  slowly.  -Per mother at bedside, patient's baseline is normally very active   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   -Neurology consulted and appreciated -No role for statins or antiplatelets in the setting continue antifungal treatment       HIV/AIDS -Infectious disease following -CD4 count on admission 21, high viral load greater than 1 million -Continue prophylaxis with Bactrim and azithromycin -ART not initiated due to high risk of IR IS, OD initiated in 1-2 weeks per ID  Perirectal abscess with pain -Patient initially admitted by general surgery, status post IND -Continue dressing changes twice a day -Continue wound care -Continue pain control with both IV and oral formulations  Genital herpes -Continue valtrex 1g BID (started on 6/28) for 7 days then needs chronic suppression   Hypomagnesemia -Magnesium 1.9today (was given 4g IV on 01/30/17) -Continue oral supplementation (would not increase PO as this could lead to diarrhea) -Continue to monitor   Diarrhea -Stool culture negative, resolved  Anemia of chronic disease -Hemoglobin 7.7this morning (has been around 8 for the past several days, however dropped to 7.6 on 01/29/17) -If hemoglobin continues to drop closer to 7, would transfuse. -Continue to monitor CBC  Hypertension -Continue IV hydralazine as needed  Thrombocytopenia -Unknown etiology, possibly medication effect -Currently stable, no reports of bleeding -Platelets 165 -Continue to monitor CBC  Facial swelling -Appears to have resolved, likely related to hypoalbuminemia  Constipation -Continue bowel regimen  Physical deconditioning -Compensated by patient's comorbidities as well as prolonged  hospitalization.  -Physical therapy assessed the patient, recommending SNF -SW consulted for placement, however, difficult to place at this time -Speech therapy assessed patient, recommended regular diet, thin liquids  Severe malnutrition  -in the context of chronic illness -nutrition consulted -continue feeding supplementation  DVT ProphylaxisSCDs  Code Status:Full  Family Communication:Mother at bedside  Disposition Plan:Admitted. Dispo to SNF when stable and available  Consultants PCCM Infectious disease Interventional radiology General surgery Neurology Neurosurgery  Procedures  Lumbar puncture Lumbar drain placement on 5/28, discontinued 6/10 Intubation/extubation Cor Trak discontinued 01/10/2017 Lumbar puncture by interventional radiology on 01/25/2017 and 01/30/2017    Lab Results  Component Value Date   PLT 159 02/01/2017      Anti-infectives    Start     Dose/Rate Route Frequency Ordered Stop   01/26/17 1545  valACYclovir (VALTREX) tablet 1,000 mg     1,000 mg Oral 2 times daily 01/26/17 1535 01/29/17 0816   01/24/17 1100  fluconazole (DIFLUCAN) tablet 400 mg     400 mg Oral Daily 01/24/17 1059     01/19/17 1530  fluconazole (DIFLUCAN) IVPB 400 mg  Status:  Discontinued     400 mg 100 mL/hr over 120 Minutes Intravenous Every 24 hours 01/19/17 1442 01/24/17 1059   01/16/17 2200  sulfamethoxazole-trimethoprim (BACTRIM DS,SEPTRA DS) 800-160 MG per tablet 1 tablet     1 tablet Oral Daily 01/16/17 2035     01/13/17 1000  sulfamethoxazole-trimethoprim (BACTRIM,SEPTRA) 200-40 MG/5ML suspension 10 mL  Status:  Discontinued     10 mL Oral Daily 01/12/17 0952 01/12/17 1149   01/12/17 1300  amphotericin B liposome (AMBISOME) 240 mg in dextrose 5 % 500 mL IVPB  Status:  Discontinued     240 mg 250 mL/hr over 120 Minutes Intravenous Every 24 hours 01/12/17 1155 01/12/17 1203   01/12/17 1300  amphotericin B liposome (AMBISOME) 240 mg in dextrose 5 %  500 mL IVPB  Status:  Discontinued  240 mg 250 mL/hr over 120 Minutes Intravenous Every 24 hours 01/12/17 1203 01/19/17 1442   01/12/17 1200  flucytosine (ANCOBON) capsule 1,500 mg  Status:  Discontinued     1,500 mg Oral Every 6 hours 01/12/17 0952 01/19/17 1442   01/06/17 1000  azithromycin (ZITHROMAX) tablet 1,200 mg     1,200 mg Oral Weekly 01/04/17 2339     01/06/17 1000  amphotericin B liposome (AMBISOME) 240 mg in dextrose 5 % 500 mL IVPB  Status:  Discontinued     4 mg/kg  59.8 kg 250 mL/hr over 120 Minutes Intravenous Every 24 hours 01/06/17 0815 01/12/17 1155   12/31/16 1000  sulfamethoxazole-trimethoprim (BACTRIM,SEPTRA) 200-40 MG/5ML suspension 10 mL  Status:  Discontinued     10 mL Per Tube Daily 12/30/16 1418 01/12/17 0952   12/31/16 1000  amphotericin B liposome (AMBISOME) 270 mg in dextrose 5 % 500 mL IVPB  Status:  Discontinued     4 mg/kg  67.6 kg 250 mL/hr over 120 Minutes Intravenous Every 24 hours 12/30/16 1510 01/06/17 0815   12/30/16 1000  azithromycin (ZITHROMAX) 200 MG/5ML suspension 1,200 mg  Status:  Discontinued     1,200 mg Per Tube Every Fri 12/26/16 2030 01/04/17 2339   12/29/16 1000  sulfamethoxazole-trimethoprim (BACTRIM,SEPTRA) 200-40 MG/5ML suspension 20 mL  Status:  Discontinued     20 mL Per Tube Daily 12/28/16 1226 12/30/16 1418   12/28/16 1000  amphotericin B liposome (AMBISOME) 240 mg in dextrose 5 % 500 mL IVPB  Status:  Discontinued     3.5 mg/kg  67.6 kg 250 mL/hr over 120 Minutes Intravenous Every 24 hours 12/28/16 0853 12/30/16 1510   12/28/16 0900  amphotericin B liposome (AMBISOME) 240 mg in dextrose 5 % 500 mL IVPB  Status:  Discontinued     3.5 mg/kg  67.6 kg 250 mL/hr over 120 Minutes Intravenous Every 24 hours 12/28/16 0832 12/28/16 0853   12/27/16 0000  flucytosine (ANCOBON) capsule 1,500 mg  Status:  Discontinued     1,500 mg Per Tube Every 6 hours 12/26/16 1909 01/12/17 0952   12/26/16 2200  sulfamethoxazole-trimethoprim (BACTRIM  DS,SEPTRA DS) 800-160 MG per tablet 1 tablet  Status:  Discontinued     1 tablet Per Tube Every 12 hours 12/26/16 1909 12/26/16 1912   12/26/16 2200  valACYclovir (VALTREX) tablet 1,000 mg     1,000 mg Per Tube 2 times daily 12/26/16 1909 01/01/17 2359   12/26/16 2200  sulfamethoxazole-trimethoprim (BACTRIM,SEPTRA) 200-40 MG/5ML suspension 20 mL  Status:  Discontinued     20 mL Per Tube Every 12 hours 12/26/16 1912 12/28/16 1226   12/23/16 2200  sulfamethoxazole-trimethoprim (BACTRIM DS,SEPTRA DS) 800-160 MG per tablet 1 tablet  Status:  Discontinued     1 tablet Oral Every 12 hours 12/23/16 1822 12/26/16 1909   12/23/16 1900  cefTRIAXone (ROCEPHIN) 2 g in dextrose 5 % 50 mL IVPB  Status:  Discontinued     2 g 100 mL/hr over 30 Minutes Intravenous Every 24 hours 12/23/16 1821 12/28/16 1122   12/23/16 0900  amphotericin B liposome (AMBISOME) 200 mg in dextrose 5 % 500 mL IVPB  Status:  Discontinued     200 mg 250 mL/hr over 120 Minutes Intravenous Every 24 hours 12/22/16 0919 12/28/16 0832   12/23/16 0800  azithromycin (ZITHROMAX) tablet 1,200 mg  Status:  Discontinued     1,200 mg Oral Weekly 12/22/16 0811 12/26/16 2029   12/22/16 2200  valACYclovir (VALTREX) tablet 1,000 mg  Status:  Discontinued     1,000 mg Oral 2 times daily 12/22/16 1611 12/26/16 1909   12/22/16 1800  amoxicillin-clavulanate (AUGMENTIN) 875-125 MG per tablet 1 tablet  Status:  Discontinued     1 tablet Oral 2 times daily with meals 12/22/16 1446 12/23/16 1821   12/22/16 1800  doxycycline (VIBRA-TABS) tablet 100 mg  Status:  Discontinued     100 mg Oral 2 times daily with meals 12/22/16 1446 12/23/16 1821   12/22/16 1200  flucytosine (ANCOBON) capsule 1,500 mg  Status:  Discontinued     1,500 mg Oral Every 6 hours 12/22/16 0614 12/26/16 1909   12/22/16 1000  azithromycin (ZITHROMAX) tablet 250 mg  Status:  Discontinued     250 mg Oral Daily 12/21/16 0224 12/21/16 1131   12/22/16 1000  doxycycline (VIBRA-TABS) tablet 100  mg  Status:  Discontinued     100 mg Oral Every 12 hours 12/22/16 0942 12/22/16 1446   12/22/16 0000  amphotericin B liposome (AMBISOME) 190 mg in dextrose 5 % 500 mL IVPB  Status:  Discontinued     3 mg/kg  64.2 kg 250 mL/hr over 120 Minutes Intravenous Every 24 hours 12/21/16 2313 12/22/16 0919   12/22/16 0000  flucytosine (ANCOBON) capsule 250 mg  Status:  Discontinued     250 mg Oral Every 6 hours 12/21/16 2314 12/21/16 2325   12/22/16 0000  flucytosine (ANCOBON) capsule 1,500 mg  Status:  Discontinued     1,500 mg Oral Every 6 hours 12/21/16 2326 12/22/16 0614   12/21/16 1645  cefTRIAXone (ROCEPHIN) 2 g in dextrose 5 % 50 mL IVPB  Status:  Discontinued     2 g 100 mL/hr over 30 Minutes Intravenous Every 24 hours 12/21/16 1539 12/22/16 1446   12/21/16 1645  metroNIDAZOLE (FLAGYL) IVPB 500 mg  Status:  Discontinued     500 mg 100 mL/hr over 60 Minutes Intravenous Every 8 hours 12/21/16 1539 12/21/16 2348   12/21/16 1600  cefTRIAXone (ROCEPHIN) 2 g in dextrose 5 % 50 mL IVPB  Status:  Discontinued     2 g 100 mL/hr over 30 Minutes Intravenous Every 24 hours 12/21/16 1131 12/21/16 1558   12/21/16 1400  vancomycin (VANCOCIN) IVPB 750 mg/150 ml premix  Status:  Discontinued     750 mg 150 mL/hr over 60 Minutes Intravenous Every 8 hours 12/21/16 0142 12/22/16 0942   12/21/16 1000  sulfamethoxazole-trimethoprim (BACTRIM DS,SEPTRA DS) 800-160 MG per tablet 1 tablet  Status:  Discontinued     1 tablet Oral Daily 12/21/16 0058 12/21/16 0100   12/21/16 1000  abacavir-dolutegravir-lamiVUDine (TRIUMEQ) 600-50-300 MG per tablet 1 tablet  Status:  Discontinued     1 tablet Oral Daily 12/21/16 0204 12/22/16 0903   12/21/16 1000  sulfamethoxazole-trimethoprim (BACTRIM DS,SEPTRA DS) 800-160 MG per tablet 1 tablet  Status:  Discontinued     1 tablet Oral Daily 12/21/16 0204 12/23/16 1822   12/21/16 0800  ceFEPIme (MAXIPIME) 2 g in dextrose 5 % 50 mL IVPB  Status:  Discontinued     2 g 100 mL/hr over  30 Minutes Intravenous Every 8 hours 12/21/16 0137 12/21/16 1131   12/21/16 0600  piperacillin-tazobactam (ZOSYN) IVPB 3.375 g  Status:  Discontinued     3.375 g 12.5 mL/hr over 240 Minutes Intravenous Every 8 hours 12/21/16 0058 12/21/16 0132   12/21/16 0200  metroNIDAZOLE (FLAGYL) IVPB 500 mg  Status:  Discontinued     500 mg 100 mL/hr over 60 Minutes Intravenous Every 8 hours  12/21/16 0137 12/22/16 1446   12/21/16 0145  ceFEPIme (MAXIPIME) 2 g in dextrose 5 % 50 mL IVPB     2 g 100 mL/hr over 30 Minutes Intravenous  Once 12/21/16 0137 12/21/16 0358   12/21/16 0145  vancomycin (VANCOCIN) IVPB 1000 mg/200 mL premix     1,000 mg 200 mL/hr over 60 Minutes Intravenous  Once 12/21/16 0142 12/21/16 0429   12/21/16 0130  piperacillin-tazobactam (ZOSYN) IVPB 3.375 g  Status:  Discontinued     3.375 g 100 mL/hr over 30 Minutes Intravenous  Once 12/21/16 0115 12/21/16 0132   12/21/16 0130  azithromycin (ZITHROMAX) powder 1 g     1 g Oral  Once 12/21/16 0125 12/21/16 0328        Objective:   Vitals:   02/01/17 1622 02/01/17 1846 02/01/17 2126 02/02/17 0645  BP: 112/70  134/70 (!) 111/54  Pulse: 99  84 79  Resp: 20  16 16   Temp:  98.6 F (37 C) 98.4 F (36.9 C) 98.8 F (37.1 C)  TempSrc:  Oral Oral   SpO2: 100%  100% 100%  Weight:      Height:        Wt Readings from Last 3 Encounters:  01/11/17 62 kg (136 lb 11 oz)  12/20/16 65.8 kg (145 lb)  12/17/16 65.8 kg (145 lb)     Intake/Output Summary (Last 24 hours) at 02/02/17 0741 Last data filed at 02/02/17 0658  Gross per 24 hour  Intake                0 ml  Output              800 ml  Net             -800 ml     Physical Exam  Awake Alert, Oriented X 3, No new F.N deficits, Normal affect [place].AT,PERRAL Supple Neck,No JVD, No cervical lymphadenopathy appriciated.  Symmetrical Chest wall movement, Good air movement bilaterally, CTAB RRR,No Gallops,Rubs or new Murmurs, No Parasternal Heave +ve B.Sounds, Abd Soft, No  tenderness, No organomegaly appriciated, No rebound - guarding or rigidity. No Cyanosis, Clubbing or edema, No new Rash or bruise     Data Review:    CBC  Recent Labs Lab 01/28/17 0344 01/29/17 0645 01/29/17 1243 01/30/17 0603 01/31/17 0359 02/01/17 0652  WBC 2.4* 3.0*  --  2.9* 2.5* 2.6*  HGB 8.0* 7.6* 7.6* 7.9* 7.7* 7.7*  HCT 25.5* 24.7* 23.8* 25.3* 24.6* 24.5*  PLT 180 174  --  161 165 159  MCV 88.9 89.5  --  91.0 88.2 88.4  MCH 27.9 27.5  --  28.4 27.6 27.8  MCHC 31.4 30.8  --  31.2 31.3 31.4  RDW 17.2* 17.3*  --  17.4* 17.1* 16.9*    Chemistries   Recent Labs Lab 01/28/17 0344 01/29/17 0645 01/29/17 0646 01/30/17 0603 01/31/17 0359 02/01/17 0652  NA 132*  --  131* 134* 131* 131*  K 4.4  --  4.5 4.5 4.3 4.1  CL 103  --  101 106 103 103  CO2 23  --  24 22 21* 23  GLUCOSE 87  --  85 81 77 91  BUN 19  --  20 21* 19 19  CREATININE 1.05  --  1.07 1.03 0.94 1.06  CALCIUM 9.3  --  9.4 9.2 9.2 9.2  MG 1.6* 1.7  --  1.5* 1.9 1.7   ------------------------------------------------------------------------------------------------------------------ No results for input(s): CHOL, HDL, LDLCALC, TRIG,  CHOLHDL, LDLDIRECT in the last 72 hours.  No results found for: HGBA1C ------------------------------------------------------------------------------------------------------------------ No results for input(s): TSH, T4TOTAL, T3FREE, THYROIDAB in the last 72 hours.  Invalid input(s): FREET3 ------------------------------------------------------------------------------------------------------------------ No results for input(s): VITAMINB12, FOLATE, FERRITIN, TIBC, IRON, RETICCTPCT in the last 72 hours.  Coagulation profile No results for input(s): INR, PROTIME in the last 168 hours.  No results for input(s): DDIMER in the last 72 hours.  Cardiac Enzymes No results for input(s): CKMB, TROPONINI, MYOGLOBIN in the last 168 hours.  Invalid input(s):  CK ------------------------------------------------------------------------------------------------------------------ No results found for: BNP  Inpatient Medications  Scheduled Meds: . amantadine  100 mg Oral BID WC  . azithromycin  1,200 mg Oral Weekly  . feeding supplement (ENSURE ENLIVE)  237 mL Oral BID BM  . fluconazole  400 mg Oral Daily  . lidocaine (PF)  5 mL Intradermal Once  . magnesium oxide  400 mg Oral BID  . mouth rinse  15 mL Mouth Rinse BID  . multivitamin with minerals  1 tablet Oral Daily  . senna-docusate  2 tablet Oral BID  . sodium chloride flush  3 mL Intravenous Q12H  . sulfamethoxazole-trimethoprim  1 tablet Oral Daily  . tamsulosin  0.4 mg Oral Daily   Continuous Infusions: . sodium chloride 50 mL/hr at 02/01/17 1921   PRN Meds:.acetaminophen (TYLENOL) oral liquid 160 mg/5 mL, acetaminophen, docusate sodium, hydrALAZINE, HYDROcodone-acetaminophen, morphine injection, [DISCONTINUED] ondansetron **OR** ondansetron (ZOFRAN) IV, polyethylene glycol, RESOURCE THICKENUP CLEAR  Micro Results Recent Results (from the past 240 hour(s))  CSF culture     Status: None   Collection Time: 01/25/17  1:33 PM  Result Value Ref Range Status   Specimen Description CSF  Final   Special Requests NONE  Final   Gram Stain   Final    WBC PRESENT, PREDOMINANTLY MONONUCLEAR YEAST CYTOSPIN SMEAR CRITICAL RESULT CALLED TO, READ BACK BY AND VERIFIED WITH: Sharlene Dory RN, AT 1038 01/28/17 BY D. VANHOOK    Culture NO GROWTH 3 DAYS  Final   Report Status 01/29/2017 FINAL  Final  Fungus Culture With Stain     Status: None (Preliminary result)   Collection Time: 01/25/17  1:33 PM  Result Value Ref Range Status   Fungus Stain Final report  Final    Comment: (NOTE) Performed At: [facility] [place], [place] [id number] Lindon Romp MD [id number]    Fungus (Mycology) Culture PENDING  Incomplete   Fungal Source CSF  Final  Fungus Culture Result      Status: None   Collection Time: 01/25/17  1:33 PM  Result Value Ref Range Status   Result 1 Comment  Final    Comment: (NOTE) KOH/Calcofluor preparation:  no fungus observed. Performed At: [facility] [place], [place] [id number] Lindon Romp MD [id number]   CSF culture     Status: None (Preliminary result)   Collection Time: 01/30/17  3:54 PM  Result Value Ref Range Status   Specimen Description CSF  Final   Special Requests NONE  Final   Gram Stain   Final    WBC PRESENT, PREDOMINANTLY MONONUCLEAR ENCAPSULATED YEAST SEEN CYTOSPIN SMEAR    Culture NO GROWTH 2 DAYS  Final   Report Status PENDING  Incomplete  Fungus Culture With Stain     Status: None (Preliminary result)   Collection Time: 01/30/17  3:54 PM  Result Value Ref Range Status   Fungus Stain Final report  Final    Comment: (NOTE)  Performed At: [facility] [place], [place] [id number] Lindon Romp MD [id number]    Fungus (Mycology) Culture PENDING  Incomplete   Fungal Source CSF  Final  Fungus Culture Result     Status: None   Collection Time: 01/30/17  3:54 PM  Result Value Ref Range Status   Result 1 Comment  Final    Comment: (NOTE) KOH/Calcofluor preparation:  no fungus observed. Performed At: [facility] [place], [place] [id number] Lindon Romp MD [id number]     Radiology Reports Ct Head Wo Contrast  Result Date: 01/06/2017 CLINICAL DATA:  Worsening responsiveness.  Meningitis. EXAM: CT HEAD WITHOUT CONTRAST TECHNIQUE: Contiguous axial images were obtained from the base of the skull through the vertex without intravenous contrast. COMPARISON:  MRI 12/27/2016. CT 12/27/2016. Multiple previous MR and CT exams. FINDINGS: Brain: Since the previous exams, the patient has developed low level hyperdensity within the caudate and putamen on the left and at the left frontoparietal vertex. These findings probably represent  petechial bleeding within areas previously shown to be affected by acute infarction. No evidence of macro hemorrhage/hematoma. Other areas of deep brain and cerebral hemispheric infarction are an apparent by CT. No sign of shift, hydrocephalus or extra-axial fluid collection. Vascular: Negative Skull: Negative Sinuses/Orbits: Clear/normal Other: None IMPRESSION: Development of low level hyperdensity in the left caudate, left putamen and at the left posterior frontal vertex, most consistent with petechial hemorrhage within areas of previously demonstrated infarction. No macro hemorrhage/hematoma. No new insult identified otherwise. No mass effect or hydrocephalus. Electronically Signed   By: Nelson Chimes M.D.   On: 01/06/2017 13:04   Dg Chest Port 1 View  Result Date: 01/08/2017 CLINICAL DATA:  HIV disease with meningitis. EXAM: PORTABLE CHEST 1 VIEW COMPARISON:  January 07, 2017 FINDINGS: Feeding tube tip is below the diaphragm. Contrast is seen in the stomach. There is no edema or consolidation. Heart is slightly enlarged with pulmonary vascularity within normal limits. No adenopathy. No bone lesions. IMPRESSION: Stable cardiac prominence. No edema or consolidation. Feeding tube tip below diaphragm. Electronically Signed   By: Lowella Grip III M.D.   On: 01/08/2017 07:26   Dg Chest Port 1 View  Result Date: 01/07/2017 CLINICAL DATA:  Respiratory failure.  HIV positive EXAM: PORTABLE CHEST 1 VIEW COMPARISON:  January 02, 2017 FINDINGS: Endotracheal tube and nasogastric tube have been removed. No pneumothorax. There is no edema or consolidation. Heart is slightly enlarged with pulmonary vascularity within normal limits. No adenopathy. No bone lesions. IMPRESSION: No pneumothorax. Mild cardiac enlargement. No edema or consolidation. Electronically Signed   By: Lowella Grip III M.D.   On: 01/07/2017 07:56   Dg Abd Portable 1v  Result Date: 01/06/2017 CLINICAL DATA:  Initial evaluation for bowel trouble,  perirectal abscess, history of HIV EXAM: PORTABLE ABDOMEN - 1 VIEW COMPARISON:  Prior CT from 12/20/2016. FINDINGS: Bowel gas pattern within normal limits without obstruction or ileus. No abnormal bowel wall thickening. No appreciable free air on this limited supine view the abdomen. Catheter tubing overlies the lower mid abdomen. No soft tissue mass or abnormal calcification. Visualized lung bases are clear. Visualized osseous structures unremarkable. IMPRESSION: Nonobstructive bowel gas pattern with no radiographic evidence for acute intra-abdominal process. Electronically Signed   By: Jeannine Boga M.D.   On: 01/06/2017 22:22   Dg Swallowing Func-speech Pathology  Result Date: 01/10/2017 Objective Swallowing Evaluation: Type of Study: MBS-Modified Barium Swallow Study Patient Details Name: Brockton Mckesson MRN: [id number]  Date of Birth: 11/13/1985 Today's Date: 01/10/2017 Time: SLP Start Time (ACUTE ONLY): 1035-SLP Stop Time (ACUTE ONLY): 1100 SLP Time Calculation (min) (ACUTE ONLY): 25 min Past Medical History: Past Medical History: Diagnosis Date . HIV (human immunodeficiency virus infection) ([place])  . Perirectal abscess  Past Surgical History: Past Surgical History: Procedure Laterality Date . INCISION AND DRAINAGE PERIRECTAL ABSCESS   . INCISION AND DRAINAGE PERIRECTAL ABSCESS N/A 12/21/2016  Procedure: IRRIGATION AND DEBRIDEMENT PERIRECTAL ABSCESS;  Surgeon: Ralene Ok, MD;  Location: [facility];  Service: General;  Laterality: N/A; . PLACEMENT OF LUMBAR DRAIN N/A 12/27/2016  Procedure: PLACEMENT OF LUMBAR DRAIN;  Surgeon: Consuella Lose, MD;  Location: [facility];  Service: Neurosurgery;  Laterality: N/A; HPI: Patient is a 31 y/o male presents with sepsis secondary to perirectal abscess s/p I&D 5/23 and disseminated cryptococcal infection including meningitis, bacteremia, and likely pneumonia. Intubated 5/28-6/5. MRI-restricted diffusion in the basal ganglia bilaterally with mild leptomeningeal  enhancement. Second MRI- extension of the diffusion restriction in the left basal ganglia and right basal ganglia. s/p Lumbar drain 5/30. 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 Inconsistent swallow trigger noted, with occasional penetration above the cords before and during the swallow. This penetrate, combined with mild to trace vallecular and lateral channeal residuals do result in trace coating of the traceheal wall post swallow (mixed with saliva) without sensation. The pt is able to complete multiple swallows to clear most residue spontaneously, but a cued intermittent throat clear is also beneficial to clear airway. Overall the pt is likely to experience trace aspiration events, but anticipate improvment in function with use of swallow mechanism and traning for compensatory strategies. Recommend pt also incorporate respiratory muscle strength training to improve pharyngeal musculature and force of expectoration. Will initiate a dys 1 (puree) diet with honey thick liquids with potential for upgrade, particularly of solids as pt demosntrates tolerance and consistency with strategies.  SLP Visit Diagnosis Dysphagia, oropharyngeal phase (R13.12) Attention and concentration deficit following -- Frontal lobe and executive function deficit following -- Impact on safety and function Moderate aspiration risk   CHL IP TREATMENT RECOMMENDATION 01/07/2017 Treatment Recommendations Therapy as outlined in treatment plan below   Prognosis 01/07/2017 Prognosis for Safe Diet Advancement Good Barriers to Reach Goals Cognitive deficits Barriers/Prognosis Comment -- CHL IP DIET RECOMMENDATION 01/10/2017 SLP Diet Recommendations Dysphagia 1  (Puree) solids;Honey thick liquids Liquid Administration via Cup;Spoon Medication Administration Crushed with puree Compensations Minimize environmental distractions;Slow rate;Small sips/bites;Multiple dry swallows after each bite/sip;Clear throat intermittently;Effortful swallow Postural Changes --   CHL IP OTHER RECOMMENDATIONS 01/10/2017 Recommended Consults -- Oral Care Recommendations Oral care BID Other Recommendations --   CHL IP FOLLOW UP RECOMMENDATIONS 01/10/2017 Follow up Recommendations Inpatient Rehab   CHL IP FREQUENCY AND DURATION 01/10/2017 Speech Therapy Frequency (ACUTE ONLY) min 3x week Treatment Duration 2 weeks      CHL IP ORAL PHASE 01/10/2017 Oral Phase WFL Oral - Pudding Teaspoon -- Oral - Pudding Cup -- Oral - Honey Teaspoon -- Oral - Honey Cup -- Oral - Nectar Teaspoon -- Oral - Nectar Cup -- Oral - Nectar Straw -- Oral - Thin Teaspoon -- Oral - Thin Cup -- Oral - Thin Straw -- Oral - Puree -- Oral - Mech Soft Impaired mastication;Weak lingual manipulation Oral -  Regular -- Oral - Multi-Consistency -- Oral - Pill -- Oral Phase - Comment --  CHL IP PHARYNGEAL PHASE 01/10/2017 Pharyngeal Phase Impaired Pharyngeal- Pudding Teaspoon -- Pharyngeal -- Pharyngeal- Pudding Cup -- Pharyngeal -- Pharyngeal- Honey Teaspoon Penetration/Aspiration during swallow;Penetration/Aspiration before swallow;Reduced epiglottic inversion;Reduced tongue base retraction;Pharyngeal residue - valleculae;Pharyngeal residue - pyriform;Delayed swallow initiation-pyriform sinuses Pharyngeal Material enters airway, remains ABOVE vocal cords and not ejected out;Material does not enter airway;Material enters airway, CONTACTS cords and not ejected out Pharyngeal- Honey Cup NT Pharyngeal -- Pharyngeal- Nectar Teaspoon Penetration/Aspiration during swallow;Penetration/Aspiration before swallow;Reduced epiglottic inversion;Reduced tongue base retraction;Pharyngeal residue - valleculae;Pharyngeal residue - pyriform;Delayed swallow  initiation-pyriform sinuses Pharyngeal Material enters airway, CONTACTS cords and not ejected out;Material enters airway, passes BELOW cords and not ejected out despite cough attempt by patient;Material does not enter airway;Material enters airway, remains ABOVE vocal cords and not ejected out Pharyngeal- Nectar Cup Penetration/Aspiration during swallow;Penetration/Aspiration before swallow;Reduced epiglottic inversion;Reduced tongue base retraction;Pharyngeal residue - valleculae;Pharyngeal residue - pyriform;Delayed swallow initiation-pyriform sinuses Pharyngeal Material enters airway, CONTACTS cords and not ejected out;Material enters airway, passes BELOW cords and not ejected out despite cough attempt by patient;Material does not enter airway;Material enters airway, remains ABOVE vocal cords and not ejected out Pharyngeal- Nectar Straw Penetration/Aspiration during swallow;Penetration/Aspiration before swallow;Reduced epiglottic inversion;Reduced tongue base retraction;Pharyngeal residue - valleculae;Pharyngeal residue - pyriform;Delayed swallow initiation-pyriform sinuses Pharyngeal Material enters airway, CONTACTS cords and not ejected out;Material enters airway, passes BELOW cords and not ejected out despite cough attempt by patient;Material does not enter airway;Material enters airway, remains ABOVE vocal cords and not ejected out Pharyngeal- Thin Teaspoon -- Pharyngeal -- Pharyngeal- Thin Cup Penetration/Aspiration during swallow;Penetration/Aspiration before swallow;Reduced epiglottic inversion;Reduced tongue base retraction;Pharyngeal residue - valleculae;Pharyngeal residue - pyriform;Delayed swallow initiation-pyriform sinuses Pharyngeal Material enters airway, CONTACTS cords and not ejected out;Material enters airway, passes BELOW cords and not ejected out despite cough attempt by patient;Material does not enter airway;Material enters airway, remains ABOVE vocal cords and not ejected out Pharyngeal-  Thin Straw Penetration/Aspiration during swallow;Penetration/Aspiration before swallow;Reduced epiglottic inversion;Reduced tongue base retraction;Pharyngeal residue - valleculae;Pharyngeal residue - pyriform;Delayed swallow initiation-pyriform sinuses Pharyngeal Material enters airway, CONTACTS cords and not ejected out;Material enters airway, passes BELOW cords and not ejected out despite cough attempt by patient;Material does not enter airway;Material enters airway, remains ABOVE vocal cords and not ejected out Pharyngeal- Puree Reduced epiglottic inversion;Reduced tongue base retraction;Pharyngeal residue - valleculae;Pharyngeal residue - pyriform;Delayed swallow initiation-pyriform sinuses Pharyngeal Material enters airway, remains ABOVE vocal cords and not ejected out;Material enters airway, remains ABOVE vocal cords then ejected out;Material does not enter airway Pharyngeal- Mechanical Soft Reduced epiglottic inversion;Reduced tongue base retraction;Pharyngeal residue - valleculae;Pharyngeal residue - pyriform;Delayed swallow initiation-pyriform sinuses Pharyngeal -- Pharyngeal- Regular -- Pharyngeal -- Pharyngeal- Multi-consistency -- Pharyngeal -- Pharyngeal- Pill -- Pharyngeal -- Pharyngeal Comment --  No flowsheet data found. No flowsheet data found. [name], MA CCC-SLP [phone] [name] 01/10/2017, 11:22 AM              Dg Cyndy Freeze Guide Lumbar Puncture  Result Date: 01/30/2017 CLINICAL DATA:  HIV. EXAM: DIAGNOSTIC LUMBAR PUNCTURE UNDER FLUOROSCOPIC GUIDANCE FLUOROSCOPY TIME:  Fluoroscopy Time:  1 minutes and 12 seconds Radiation Exposure Index (if provided by the fluoroscopic device): Not applicable. Number of Acquired Spot Images: 1 PROCEDURE: Informed consent was obtained from the patient's mother prior to the procedure, including potential complications of headache, allergy, and pain. With the patient prone, the lower back was prepped with Betadine. 1% Lidocaine was used for  local anesthesia. Lumbar puncture was  performed at the L2-3 level using a 20 gauge needle with return of clear CSF with an opening pressure of 9 cm water. 3.5 ml of CSF were obtained for laboratory studies. The patient tolerated the procedure well and there were no apparent complications. Further fluid could not be obtained secondary to patient's clinical status and low pressure. Closing pressure was not performed secondary to low pressure throughout. IMPRESSION: Non complicated lumbar puncture as detailed above. Electronically Signed   By: Abigail Miyamoto M.D.   On: 01/30/2017 16:26   Dg Fluoro Guide Lumbar Puncture  Result Date: 01/25/2017 Etheleen Mayhew, MD     01/25/2017  1:46 PM Lumbar puncture performed at L2-L3.  Opening pressure 22 cm H20.  22 mL clear CSF obtained and sent to lab for testing.  Please see full dictation in PACS for additional details.    Time Spent in minutes  30   Jani Gravel M.D on 02/02/2017 at 7:41 AM  Between 7am to 7pm - Pager - [phone]  After 7pm go to [email or web address] - password [facility]  [facility] -  Office  [phone]

## 2017-02-03 LAB — MAGNESIUM: MAGNESIUM: 1.6 mg/dL — AB (ref 1.7–2.4)

## 2017-02-03 LAB — CSF CULTURE

## 2017-02-03 LAB — COMPREHENSIVE METABOLIC PANEL
ALBUMIN: 2.7 g/dL — AB (ref 3.5–5.0)
ALK PHOS: 84 U/L (ref 38–126)
ALT: 27 U/L (ref 17–63)
AST: 31 U/L (ref 15–41)
Anion gap: 7 (ref 5–15)
BUN: 23 mg/dL — ABNORMAL HIGH (ref 6–20)
CALCIUM: 9.2 mg/dL (ref 8.9–10.3)
CO2: 21 mmol/L — AB (ref 22–32)
CREATININE: 1.03 mg/dL (ref 0.61–1.24)
Chloride: 104 mmol/L (ref 101–111)
GFR calc Af Amer: >60 mL/min (ref >60)
GFR calc non Af Amer: >60 mL/min (ref >60)
GLUCOSE: 91 mg/dL (ref 65–99)
Potassium: 4.4 mmol/L (ref 3.5–5.1)
SODIUM: 132 mmol/L — AB (ref 135–145)
Total Bilirubin: 0.5 mg/dL (ref 0.3–1.2)
Total Protein: 9.4 g/dL — ABNORMAL HIGH (ref 6.5–8.1)

## 2017-02-03 LAB — CBC
HCT: 24.2 % — ABNORMAL LOW (ref 39.0–52.0)
HEMOGLOBIN: 7.6 g/dL — AB (ref 13.0–17.0)
MCH: 27.8 pg (ref 26.0–34.0)
MCHC: 31.4 g/dL (ref 30.0–36.0)
MCV: 88.6 fL (ref 78.0–100.0)
Platelets: 147 10*3/uL — ABNORMAL LOW (ref 150–400)
RBC: 2.73 MIL/uL — AB (ref 4.22–5.81)
RDW: 16.9 % — ABNORMAL HIGH (ref 11.5–15.5)
WBC: 2.7 10*3/uL — ABNORMAL LOW (ref 4.0–10.5)

## 2017-02-03 LAB — PHOSPHORUS: Phosphorus: 5 mg/dL — ABNORMAL HIGH (ref 2.5–4.6)

## 2017-02-03 LAB — CSF CULTURE W GRAM STAIN: Culture: NO GROWTH

## 2017-02-03 NOTE — Progress Notes (Signed)
Physical Therapy Treatment Patient Details Name: William Pena MRN: [id number] DOB: 03/26/1986 Today's Date: 02/03/2017    History of Present Illness Patient is a 31 y/o male presents with sepsis secondary to perirectal abscess s/p I&D 5/23 and disseminated cryptococcal infection including meningitis, bacteremia, and likely pneumonia. Intubated 5/28-6/5. Pt found to have acute ischemic strokes. 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 Second MRI- extension of the diffusion restriction in the left basal ganglia and right basal ganglia. s/p Lumbar drain 5/30. PMH includes advanced hiv disease    PT Comments    Pt fatigued form extended time in the chair.  Emphasis on transfer technique and support in weight bearing to give pt time to assist squat pivot transfer.   Follow Up Recommendations  SNF     Equipment Recommendations       Recommendations for Other Services       Precautions / Restrictions Precautions Precautions: Fall    Mobility  Bed Mobility Overal bed mobility: Needs Assistance Bed Mobility: Sit to Supine       Sit to supine: Total assist;+2 for physical assistance   General bed mobility comments: pt fatigue after so long in the chair, but tried assisting with left ue on bed.  Transfers Overall transfer level: Needs assistance   Transfers: Squat Pivot Transfers     Squat pivot transfers: Max assist;+2 safety/equipment     General transfer comment: cues for sequence of task.  pt given time to bear weight and try to reach to bed for pivot.  Ambulation/Gait                 Stairs            Wheelchair Mobility    Modified Rankin (Stroke Patients Only)       Balance     Sitting balance-Leahy Scale: Zero       Standing balance-Leahy Scale: Zero                              Cognition Arousal/Alertness: Awake/alert Behavior During Therapy: Flat affect Overall  Cognitive Status: Impaired/Different from baseline                                        Exercises      General Comments        Pertinent Vitals/Pain Pain Assessment: Faces Faces Pain Scale: Hurts little more Pain Location: buttocks Pain Descriptors / Indicators: Sore Pain Intervention(s): Premedicated before session    Home Living                      Prior Function            PT Goals (current goals can now be found in the care plan section) Acute Rehab PT Goals Patient Stated Goal: did not state Progress towards PT goals: Progressing toward goals    Frequency    Min 3X/week      PT Plan Current plan remains appropriate    Co-evaluation              AM-PAC PT "6 Clicks" Daily Activity  Outcome Measure  Difficulty turning over in bed (including adjusting bedclothes, sheets and blankets)?: Total Difficulty moving from lying on back to sitting on the side of the bed? : Total Difficulty sitting down  on and standing up from a chair with arms (e.g., wheelchair, bedside commode, etc,.)?: Total Help needed moving to and from a bed to chair (including a wheelchair)?: Total Help needed walking in hospital room?: Total Help needed climbing 3-5 steps with a railing? : Total 6 Click Score: 6    End of Session   Activity Tolerance: Patient tolerated treatment well;Patient limited by fatigue Patient left: in bed;with call bell/phone within reach;with bed alarm set;with nursing/sitter in room Nurse Communication: Mobility status PT Visit Diagnosis: Unsteadiness on feet (R26.81);Other abnormalities of gait and mobility (R26.89);Hemiplegia and hemiparesis Hemiplegia - Right/Left: Left Hemiplegia - dominant/non-dominant: Non-dominant Hemiplegia - caused by: Cerebral infarction     Time: 1740-1750 PT Time Calculation (min) (ACUTE ONLY): 10 min  Charges:  $Therapeutic Activity: 8-22 mins                    G Codes:        02/28/17  Donnella Sham, PT [phone]  (pager)   Tessie Fass Avina Eberle February 28, 2017, 7:24 PM

## 2017-02-03 NOTE — Progress Notes (Signed)
Patient ID: William Pena, male   DOB: Mar 25, 1986, 31 y.o.   MRN: [id number]          [facility]  Date of Admission:  12/20/2016   Total days of cryptococcal therapy 44        Day 16 fluconazole         ASSESSMENT: William Pena is making slow progress on therapy for severe cryptococcal meningoencephalitis. His CSF pressure was low normal at the time of his last lumbar puncture on 01/30/2017 which is a marked improvement. His CSF cultures have been negative since 12/30/2016.  PLAN: 1. Continue current dose of fluconazole for 4 more weeks then start antiretroviral therapy. 2. Please call Dr. 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 amantadine  100 mg Oral BID WC  . azithromycin  1,200 mg Oral Weekly  . feeding supplement (ENSURE ENLIVE)  237 mL Oral BID BM  . feeding supplement (PRO-STAT SUGAR FREE 64)  30 mL Oral QHS  . fluconazole  400 mg Oral Daily  . lidocaine (PF)  5 mL Intradermal Once  . magnesium oxide  400 mg Oral BID  . mouth rinse  15 mL Mouth Rinse BID  . multivitamin with minerals  1 tablet Oral Daily  .  senna-docusate  2 tablet Oral BID  . sodium chloride flush  3 mL Intravenous Q12H  . sulfamethoxazole-trimethoprim  1 tablet Oral Daily  . tamsulosin  0.4 mg Oral Daily    SUBJECTIVE: William Pena tells me that he is feeling all right.  Review of Systems: Review of Systems  Unable to perform ROS: Mental acuity    Allergies  Allergen Reactions  . Carrot Oil Swelling    "throat swell"   . Augmentin [Amoxicillin-Pot Clavulanate]     "break out"    OBJECTIVE: Vitals:   02/02/17 0645 02/02/17 1429 02/02/17 2129 02/03/17 0530  BP: (!) 111/54 100/66 103/66 98/62  Pulse: 79 78 86 79  Resp: 16 18 18 18   Temp: 98.8 F (37.1 C) 98.1 F (36.7 C) 99.1 F (37.3 C) 98.1 F (36.7 C)  TempSrc:   Oral   SpO2: 100% 100% 99% 100%  Weight:      Height:       Body mass index is 22.06 kg/m.  Physical Exam  Constitutional:  [name] alert and out of bed sitting up in a chair.  Eyes: Conjunctivae are normal.  Neck: Neck supple.  Cardiovascular: Normal rate and regular rhythm.   No murmur heard. Pulmonary/Chest: Effort normal and breath sounds normal.  Abdominal: Soft. There is no tenderness.  Neurological: [name] alert.  He continues to speak  very softly and slowly with mostly 1 word answers.  Skin: No rash noted.    Lab Results Lab Results  Component Value Date   WBC 2.7 (L) 02/03/2017   HGB 7.6 (L) 02/03/2017   HCT 24.2 (L) 02/03/2017   MCV 88.6 02/03/2017   PLT 147 (L) 02/03/2017    Lab Results  Component Value Date   CREATININE 1.03 02/03/2017   BUN 23 (H) 02/03/2017   NA 132 (L) 02/03/2017   K 4.4 02/03/2017   CL 104 02/03/2017   CO2 21 (L) 02/03/2017    Lab Results  Component Value Date   ALT 27 02/03/2017   AST 31 02/03/2017   ALKPHOS 84 02/03/2017   BILITOT 0.5 02/03/2017     Microbiology: Recent Results (from the past 240 hour(s))  CSF culture     Status: None   Collection Time: 01/25/17  1:33 PM  Result Value Ref Range Status   Specimen Description CSF   Final   Special Requests NONE  Final   Gram Stain   Final    WBC PRESENT, PREDOMINANTLY MONONUCLEAR YEAST CYTOSPIN SMEAR CRITICAL RESULT CALLED TO, READ BACK BY AND VERIFIED WITH: Sharlene Dory RN, AT 1038 01/28/17 BY D. VANHOOK    Culture NO GROWTH 3 DAYS  Final   Report Status 01/29/2017 FINAL  Final  Fungus Culture With Stain     Status: None (Preliminary result)   Collection Time: 01/25/17  1:33 PM  Result Value Ref Range Status   Fungus Stain Final report  Final    Comment: (NOTE) Performed At: [facility] [place], [place] [id number] Lindon Romp MD YP:[id number]    Fungus (Mycology) Culture PENDING  Incomplete   Fungal Source CSF  Final  Fungus Culture Result     Status: None   Collection Time: 01/25/17  1:33 PM  Result Value Ref Range Status   Result 1 Comment  Final    Comment: (NOTE) KOH/Calcofluor preparation:  no fungus observed. Performed At: [facility] [place], [place] [id number] Lindon Romp MD AS:[id number]   CSF culture     Status: None   Collection Time: 01/30/17  3:54 PM  Result Value Ref Range Status   Specimen Description CSF  Final   Special Requests NONE  Final   Gram Stain   Final    WBC PRESENT, PREDOMINANTLY MONONUCLEAR ENCAPSULATED YEAST SEEN CYTOSPIN SMEAR    Culture NO GROWTH 3 DAYS  Final   Report Status 02/03/2017 FINAL  Final  Fungus Culture With Stain     Status: None (Preliminary result)   Collection Time: 01/30/17  3:54 PM  Result Value Ref Range Status   Fungus Stain Final report  Final    Comment: (NOTE) Performed At: [facility] [place], [place] [id number] Lindon Romp MD [id number]    Fungus (Mycology) Culture PENDING  Incomplete   Fungal Source CSF  Final  Fungus Culture Result     Status: None   Collection Time: 01/30/17  3:54 PM  Result Value Ref Range Status   Result 1 Comment  Final    Comment: (NOTE) KOH/Calcofluor preparation:  no  fungus observed. Performed At: [facility] [address] [place], [place] [id number] [place] [id number]     Michel Bickers, MD [place] [facility] [phone] pager   [phone] cell 02/03/2017, 3:53 PM

## 2017-02-03 NOTE — Progress Notes (Signed)
Patient ID: William Pena, male   DOB: 1986/03/30, 31 y.o.   MRN: [id number]                                                                PROGRESS NOTE                                                                                                                                                                                                             Patient Demographics:    William Pena, is a 31 y.o. male, DOB - 1985-10-17, BSJ:[id number]  Admit date - 12/20/2016   Admitting Physician Modena Jansky, MD  Outpatient Primary MD for the patient is [name], [name], MD  LOS - [phone]  Outpatient Specialists:     Chief Complaint  Patient presents with  . Rectal Bleeding  . Generalized Body Aches       Brief Narrative  HPI on 12/20/2016 by Dr. Stark Klein (surgery) Pt is a 31 yo M with history of recurrent perirectal abscesses. He was treated at [facility] with surgery in January by Dr. Ronita Hipps, but now is incarcerated in [place]. He has had multiple different types of drains, but currently just has a draining seton in place at the site of a fistula. He has been to the ED 3 times in the last week. Due to his current incarceration in [facility], he cannot be transferred to [place]. He reports severe rectal pain. He has had fever to 103 and had sepsis workup this AM at [facility] long. He did have a CT on 5/19 that showed small abscess and antibiotics were recommended. He was not able to be compliant with that for unclear reasons. He has also had abdominal pain. [name] also seen at [facility] for his AIDS by Dr. Tobie Poet. [name] frequently homeless as well. [name] reportedly on his HIV meds. Last CD 4 count looks like 1 /microliter (10/04/2016) and last viral load looks like 50,936/mL (08/08/2016).  Interim history During admission he was also noted to have headache, fever, chills, night sweats, neck pain and lymphadenopathy. ID was consulted and recommended imaging of chest, LP and cultures be  sent from perirectal I&D. Also his serum cryptococcal antigen titer was elevated raising the possibility of disseminated cryptococcal infection including lung involvement. Pulmonology was  consulted by ID for bronchoscopy and washings in an effort to help determine if his CT changes reflect TB or possible pulmonary cryptococcal disease. Initially unable to perform bronchoscopy due to hypoxia. 5/28 patient had altered LOC, became very lethargic/obtunded requiring CT and LP, complicated by acute respiratory failure due to altered mental status that required mechanical ventilation. He had a lumbar CSF drain to depression arise. Now extubated, drain removed. Failed swallowing evaluation and has Cor trak forte tube feeds. Mental status slightly better but waxing and waning. ID, neurology, neurosurgery and CCM consulted during hospital course. Care transferred to stepdown and [place] on 01/09/17. MS slowly improving. Cor Trak discontinued 6/12 and diet initiated. Transferred to medical bed 6/13.Acute kidney injury on 6/14 (creatinine 2.8), likely due to acute urinary retention & Amphotericin> resolved. Antimicrobials per ID    Subjective:    William Pena today has been afebrile.  Pt is seeming stronger and more alert per staff.  I had an opportunity to see his wounds and he has a quarter to 1/2 dollar sized sacral decub forming.  Slight serosanquinous drainage.    No headache, No chest pain, No abdominal pain - No Nausea, No new weakness tingling or numbness, No Cough - SOB.    Assessment  & Plan :    Principal Problem:   Perirectal abscess Active Problems:   Chronic pain   AIDS (acquired immune deficiency syndrome) (HCC)   Hyponatremia   Normocytic anemia   Elevated LFTs   Pulmonary infiltrates   Hypomagnesemia   Encephalopathy acute   SIRS (systemic inflammatory response syndrome) (HCC)   Acute respiratory failure (HCC)   Rectal abscess   Cryptococcal meningoencephalitis (HCC)   Disseminated  cryptococcosis (HCC)   Cavitary pneumonia   Diffuse lymphadenopathy   Drug rash   Elevated intracranial pressure   HIV disease (HCC)   Lymphadenopathy of head and neck   HIV (human immunodeficiency virus infection) ([place])   Meningitis   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 Lumbar puncture done on 01/20/2017 showed opening pressure of 31, better than before, it was 55 -CSF gram stain showed yeast -Discussed with infectious disease, Dr. Baxter Flattery. Patient will need repeat lumbar puncture as he did have an elevated opening pressure on previous lumbar puncture. -Interventional radiology consult appreciated, status post lumbar puncture on 01/25/2017. Opening pressure 22. CSF obtained and sent to the lab, results pending. -Discussed with Dr. Baxter Flattery, since patient is currently asymptomatic and alert, will defer VP shunt for now -s/p LP on 01/30/2017 by IR, with opening pressure 9cm. 3.5cc obtained.   CSF culture 6/22=> ngtd CSF culture 6/27=> ngtd CSF culture 7/2=> ngtd  No antiretroviral x4 weeks per ID Appreciate ID input  Acute urinary retention -Continue Foley catheter and Flomax -Patient failed voiding trial twice Leave in foley due to wound  Acute kidney injury -Likely secondary to urinary retention and possibly amphotericin -Creatinine peaked to 2.84, currently 0.94 -Continue to monitor BMP  Hyponatremia Stable  Severe Protein Calorie Malnutrition prostat  Acute hypoxic  respiratory failure secondary cryptococcal pneumonia -Required intubation, was extubated on 01/03/2017 -Patient  currently on room air  Acute encephalopathy -Likely multi-factorial including cryptococcal meningitis, acute strokes, metabolic arrangements, medication versus respiratory etiology  -Has been improving slowly.  -Per mother at bedside, patient's baseline is normally very active   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  -Neurology consulted and appreciated -No role for statins or antiplatelets in the setting continue antifungal treatment   HIV/AIDS -Infectious disease following -CD4 count on admission 21, high viral load greater than 1 million -Continue prophylaxis with Bactrim and azithromycin -ART not initiated due to high risk of IR IS, OD initiated in 1-2 weeks per ID  Perirectal abscess with pain -Patient initially admitted by general surgery, status post IND -Continue dressing changes twice a day -Continue wound care -Continue pain control with both IV and oral formulations  Genital herpes -Continue valtrex 1g BID (started on 6/28) for 7 days then needs chronic suppression   Hypomagnesemia -Magnesium 1.9today (was given 4g IV on 01/30/17) -Continue oral supplementation (would not increase PO as this could lead to diarrhea) -Continue to monitor   Diarrhea -Stool culture negative, resolved  Anemia of chronic disease -Hemoglobin 7.7this morning (has been around 8 for the past several days, however dropped to 7.6 on 01/29/17) -If hemoglobin continues to drop closer to 7, would transfuse. -Continue to monitor CBC  Hypertension -Continue IV hydralazine as needed  Thrombocytopenia -Unknown etiology, possibly medication effect -Currently stable, no reports of bleeding -Platelets 165 -Continue to monitor CBC  Facial swelling -Appears to have resolved, likely related to  hypoalbuminemia  Constipation -Continue bowel regimen  Physical deconditioning -Compensated by patient's comorbidities as well as prolonged hospitalization.  -Physical therapy assessed the patient, recommending SNF -SW consulted for placement, however, difficult to place at this time -Speech therapy assessed patient, recommended regular diet, thin liquids  Severe malnutrition  -in the context of chronic illness -nutrition consulted -continue feeding supplementation  DVT ProphylaxisSCDs  Code Status:Full  Family Communication:Mother at bedside  Disposition Plan:Admitted. Dispo to SNF when stable and available  Consultants PCCM Infectious disease Interventional radiology General surgery Neurology Neurosurgery  Procedures  Lumbar puncture Lumbar drain placement on 5/28, discontinued 6/10 Intubation/extubation Cor Trak discontinued 01/10/2017 Lumbar puncture by interventional radiology on 01/25/2017 and 01/30/2017    Lab Results  Component Value Date   PLT 147 (L) 02/03/2017      Anti-infectives    Start     Dose/Rate Route Frequency Ordered Stop   01/26/17 1545  valACYclovir (VALTREX) tablet 1,000 mg     1,000 mg Oral 2 times daily 01/26/17 1535 01/29/17 0816   01/24/17 1100  fluconazole (DIFLUCAN) tablet 400 mg     400 mg Oral Daily 01/24/17 1059     01/19/17 1530  fluconazole (DIFLUCAN) IVPB 400 mg  Status:  Discontinued     400 mg 100 mL/hr over 120 Minutes Intravenous Every 24 hours 01/19/17 1442 01/24/17 1059   01/16/17 2200  sulfamethoxazole-trimethoprim (BACTRIM DS,SEPTRA DS) 800-160 MG per tablet 1 tablet     1 tablet Oral Daily 01/16/17 2035     01/13/17 1000  sulfamethoxazole-trimethoprim (BACTRIM,SEPTRA) 200-40 MG/5ML suspension 10 mL  Status:  Discontinued     10 mL Oral Daily 01/12/17 0952 01/12/17 1149   01/12/17 1300  amphotericin B liposome (AMBISOME) 240 mg in dextrose 5 % 500 mL IVPB  Status:  Discontinued     240 mg 250 mL/hr  over 120 Minutes Intravenous Every 24 hours 01/12/17 1155 01/12/17 1203   01/12/17 1300  amphotericin  B liposome (AMBISOME) 240 mg in dextrose 5 % 500 mL IVPB  Status:  Discontinued     240 mg 250 mL/hr over 120 Minutes Intravenous Every 24 hours 01/12/17 1203 01/19/17 1442   01/12/17 1200  flucytosine (ANCOBON) capsule 1,500 mg  Status:  Discontinued     1,500 mg Oral Every 6 hours 01/12/17 0952 01/19/17 1442   01/06/17 1000  azithromycin (ZITHROMAX) tablet 1,200 mg     1,200 mg Oral Weekly 01/04/17 2339     01/06/17 1000  amphotericin B liposome (AMBISOME) 240 mg in dextrose 5 % 500 mL IVPB  Status:  Discontinued     4 mg/kg  59.8 kg 250 mL/hr over 120 Minutes Intravenous Every 24 hours 01/06/17 0815 01/12/17 1155   12/31/16 1000  sulfamethoxazole-trimethoprim (BACTRIM,SEPTRA) 200-40 MG/5ML suspension 10 mL  Status:  Discontinued     10 mL Per Tube Daily 12/30/16 1418 01/12/17 0952   12/31/16 1000  amphotericin B liposome (AMBISOME) 270 mg in dextrose 5 % 500 mL IVPB  Status:  Discontinued     4 mg/kg  67.6 kg 250 mL/hr over 120 Minutes Intravenous Every 24 hours 12/30/16 1510 01/06/17 0815   12/30/16 1000  azithromycin (ZITHROMAX) 200 MG/5ML suspension 1,200 mg  Status:  Discontinued     1,200 mg Per Tube Every Fri 12/26/16 2030 01/04/17 2339   12/29/16 1000  sulfamethoxazole-trimethoprim (BACTRIM,SEPTRA) 200-40 MG/5ML suspension 20 mL  Status:  Discontinued     20 mL Per Tube Daily 12/28/16 1226 12/30/16 1418   12/28/16 1000  amphotericin B liposome (AMBISOME) 240 mg in dextrose 5 % 500 mL IVPB  Status:  Discontinued     3.5 mg/kg  67.6 kg 250 mL/hr over 120 Minutes Intravenous Every 24 hours 12/28/16 0853 12/30/16 1510   12/28/16 0900  amphotericin B liposome (AMBISOME) 240 mg in dextrose 5 % 500 mL IVPB  Status:  Discontinued     3.5 mg/kg  67.6 kg 250 mL/hr over 120 Minutes Intravenous Every 24 hours 12/28/16 0832 12/28/16 0853   12/27/16 0000  flucytosine (ANCOBON) capsule 1,500  mg  Status:  Discontinued     1,500 mg Per Tube Every 6 hours 12/26/16 1909 01/12/17 0952   12/26/16 2200  sulfamethoxazole-trimethoprim (BACTRIM DS,SEPTRA DS) 800-160 MG per tablet 1 tablet  Status:  Discontinued     1 tablet Per Tube Every 12 hours 12/26/16 1909 12/26/16 1912   12/26/16 2200  valACYclovir (VALTREX) tablet 1,000 mg     1,000 mg Per Tube 2 times daily 12/26/16 1909 01/01/17 2359   12/26/16 2200  sulfamethoxazole-trimethoprim (BACTRIM,SEPTRA) 200-40 MG/5ML suspension 20 mL  Status:  Discontinued     20 mL Per Tube Every 12 hours 12/26/16 1912 12/28/16 1226   12/23/16 2200  sulfamethoxazole-trimethoprim (BACTRIM DS,SEPTRA DS) 800-160 MG per tablet 1 tablet  Status:  Discontinued     1 tablet Oral Every 12 hours 12/23/16 1822 12/26/16 1909   12/23/16 1900  cefTRIAXone (ROCEPHIN) 2 g in dextrose 5 % 50 mL IVPB  Status:  Discontinued     2 g 100 mL/hr over 30 Minutes Intravenous Every 24 hours 12/23/16 1821 12/28/16 1122   12/23/16 0900  amphotericin B liposome (AMBISOME) 200 mg in dextrose 5 % 500 mL IVPB  Status:  Discontinued     200 mg 250 mL/hr over 120 Minutes Intravenous Every 24 hours 12/22/16 0919 12/28/16 0832   12/23/16 0800  azithromycin (ZITHROMAX) tablet 1,200 mg  Status:  Discontinued  1,200 mg Oral Weekly 12/22/16 0811 12/26/16 2029   12/22/16 2200  valACYclovir (VALTREX) tablet 1,000 mg  Status:  Discontinued     1,000 mg Oral 2 times daily 12/22/16 1611 12/26/16 1909   12/22/16 1800  amoxicillin-clavulanate (AUGMENTIN) 875-125 MG per tablet 1 tablet  Status:  Discontinued     1 tablet Oral 2 times daily with meals 12/22/16 1446 12/23/16 1821   12/22/16 1800  doxycycline (VIBRA-TABS) tablet 100 mg  Status:  Discontinued     100 mg Oral 2 times daily with meals 12/22/16 1446 12/23/16 1821   12/22/16 1200  flucytosine (ANCOBON) capsule 1,500 mg  Status:  Discontinued     1,500 mg Oral Every 6 hours 12/22/16 0614 12/26/16 1909   12/22/16 1000  azithromycin  (ZITHROMAX) tablet 250 mg  Status:  Discontinued     250 mg Oral Daily 12/21/16 0224 12/21/16 1131   12/22/16 1000  doxycycline (VIBRA-TABS) tablet 100 mg  Status:  Discontinued     100 mg Oral Every 12 hours 12/22/16 0942 12/22/16 1446   12/22/16 0000  amphotericin B liposome (AMBISOME) 190 mg in dextrose 5 % 500 mL IVPB  Status:  Discontinued     3 mg/kg  64.2 kg 250 mL/hr over 120 Minutes Intravenous Every 24 hours 12/21/16 2313 12/22/16 0919   12/22/16 0000  flucytosine (ANCOBON) capsule 250 mg  Status:  Discontinued     250 mg Oral Every 6 hours 12/21/16 2314 12/21/16 2325   12/22/16 0000  flucytosine (ANCOBON) capsule 1,500 mg  Status:  Discontinued     1,500 mg Oral Every 6 hours 12/21/16 2326 12/22/16 0614   12/21/16 1645  cefTRIAXone (ROCEPHIN) 2 g in dextrose 5 % 50 mL IVPB  Status:  Discontinued     2 g 100 mL/hr over 30 Minutes Intravenous Every 24 hours 12/21/16 1539 12/22/16 1446   12/21/16 1645  metroNIDAZOLE (FLAGYL) IVPB 500 mg  Status:  Discontinued     500 mg 100 mL/hr over 60 Minutes Intravenous Every 8 hours 12/21/16 1539 12/21/16 2348   12/21/16 1600  cefTRIAXone (ROCEPHIN) 2 g in dextrose 5 % 50 mL IVPB  Status:  Discontinued     2 g 100 mL/hr over 30 Minutes Intravenous Every 24 hours 12/21/16 1131 12/21/16 1558   12/21/16 1400  vancomycin (VANCOCIN) IVPB 750 mg/150 ml premix  Status:  Discontinued     750 mg 150 mL/hr over 60 Minutes Intravenous Every 8 hours 12/21/16 0142 12/22/16 0942   12/21/16 1000  sulfamethoxazole-trimethoprim (BACTRIM DS,SEPTRA DS) 800-160 MG per tablet 1 tablet  Status:  Discontinued     1 tablet Oral Daily 12/21/16 0058 12/21/16 0100   12/21/16 1000  abacavir-dolutegravir-lamiVUDine (TRIUMEQ) 600-50-300 MG per tablet 1 tablet  Status:  Discontinued     1 tablet Oral Daily 12/21/16 0204 12/22/16 0903   12/21/16 1000  sulfamethoxazole-trimethoprim (BACTRIM DS,SEPTRA DS) 800-160 MG per tablet 1 tablet  Status:  Discontinued     1 tablet  Oral Daily 12/21/16 0204 12/23/16 1822   12/21/16 0800  ceFEPIme (MAXIPIME) 2 g in dextrose 5 % 50 mL IVPB  Status:  Discontinued     2 g 100 mL/hr over 30 Minutes Intravenous Every 8 hours 12/21/16 0137 12/21/16 1131   12/21/16 0600  piperacillin-tazobactam (ZOSYN) IVPB 3.375 g  Status:  Discontinued     3.375 g 12.5 mL/hr over 240 Minutes Intravenous Every 8 hours 12/21/16 0058 12/21/16 0132   12/21/16 0200  metroNIDAZOLE (FLAGYL) IVPB  500 mg  Status:  Discontinued     500 mg 100 mL/hr over 60 Minutes Intravenous Every 8 hours 12/21/16 0137 12/22/16 1446   12/21/16 0145  ceFEPIme (MAXIPIME) 2 g in dextrose 5 % 50 mL IVPB     2 g 100 mL/hr over 30 Minutes Intravenous  Once 12/21/16 0137 12/21/16 0358   12/21/16 0145  vancomycin (VANCOCIN) IVPB 1000 mg/200 mL premix     1,000 mg 200 mL/hr over 60 Minutes Intravenous  Once 12/21/16 0142 12/21/16 0429   12/21/16 0130  piperacillin-tazobactam (ZOSYN) IVPB 3.375 g  Status:  Discontinued     3.375 g 100 mL/hr over 30 Minutes Intravenous  Once 12/21/16 0115 12/21/16 0132   12/21/16 0130  azithromycin (ZITHROMAX) powder 1 g     1 g Oral  Once 12/21/16 0125 12/21/16 0328        Objective:   Vitals:   02/02/17 1429 02/02/17 2129 02/03/17 0530 02/03/17 1633  BP: 100/66 103/66 98/62 118/65  Pulse: 78 86 79 100  Resp: 18 18 18 16   Temp: 98.1 F (36.7 C) 99.1 F (37.3 C) 98.1 F (36.7 C) 98.1 F (36.7 C)  TempSrc:  Oral  Oral  SpO2: 100% 99% 100% 100%  Weight:      Height:        Wt Readings from Last 3 Encounters:  01/11/17 62 kg (136 lb 11 oz)  12/20/16 65.8 kg (145 lb)  12/17/16 65.8 kg (145 lb)     Intake/Output Summary (Last 24 hours) at 02/03/17 1852 Last data filed at 02/03/17 1130  Gross per 24 hour  Intake              340 ml  Output             1450 ml  Net            -1110 ml     Physical Exam  Awake Alert, Oriented X 3, No new F.N deficits, Normal affect [place].AT,PERRAL Supple Neck,No JVD, No cervical  lymphadenopathy appriciated.  Symmetrical Chest wall movement, Good air movement bilaterally, CTAB RRR,No Gallops,Rubs or new Murmurs, No Parasternal Heave +ve B.Sounds, Abd Soft, No tenderness, No organomegaly appriciated, No rebound - guarding or rigidity. No Cyanosis, Clubbing or edema, No new Rash or bruise  3cm sacral decub, about 0.58mm deep Slight blood drainage,  Wound is clean,  There is evidence of I and D  X2.       Data Review:    CBC  Recent Labs Lab 01/29/17 0645 01/29/17 1243 01/30/17 0603 01/31/17 0359 02/01/17 0652 02/03/17 0552  WBC 3.0*  --  2.9* 2.5* 2.6* 2.7*  HGB 7.6* 7.6* 7.9* 7.7* 7.7* 7.6*  HCT 24.7* 23.8* 25.3* 24.6* 24.5* 24.2*  PLT 174  --  161 165 159 147*  MCV 89.5  --  91.0 88.2 88.4 88.6  MCH 27.5  --  28.4 27.6 27.8 27.8  MCHC 30.8  --  31.2 31.3 31.4 31.4  RDW 17.3*  --  17.4* 17.1* 16.9* 16.9*    Chemistries   Recent Labs Lab 01/30/17 0603 01/31/17 0359 02/01/17 0652 02/02/17 0735 02/03/17 0552  NA 134* 131* 131* 134* 132*  K 4.5 4.3 4.1 4.0 4.4  CL 106 103 103 106 104  CO2 22 21* 23 22 21*  GLUCOSE 81 77 91 86 91  BUN 21* 19 19 18  23*  CREATININE 1.03 0.94 1.06 1.02 1.03  CALCIUM 9.2 9.2 9.2 9.1 9.2  MG  1.5* 1.9 1.7 1.5* 1.6*  AST  --   --   --   --  31  ALT  --   --   --   --  27  ALKPHOS  --   --   --   --  84  BILITOT  --   --   --   --  0.5   ------------------------------------------------------------------------------------------------------------------ No results for input(s): CHOL, HDL, LDLCALC, TRIG, CHOLHDL, LDLDIRECT in the last 72 hours.  No results found for: HGBA1C ------------------------------------------------------------------------------------------------------------------ No results for input(s): TSH, T4TOTAL, T3FREE, THYROIDAB in the last 72 hours.  Invalid input(s): FREET3 ------------------------------------------------------------------------------------------------------------------ No results  for input(s): VITAMINB12, FOLATE, FERRITIN, TIBC, IRON, RETICCTPCT in the last 72 hours.  Coagulation profile No results for input(s): INR, PROTIME in the last 168 hours.  No results for input(s): DDIMER in the last 72 hours.  Cardiac Enzymes No results for input(s): CKMB, TROPONINI, MYOGLOBIN in the last 168 hours.  Invalid input(s): CK ------------------------------------------------------------------------------------------------------------------ No results found for: BNP  Inpatient Medications  Scheduled Meds: . amantadine  100 mg Oral BID WC  . azithromycin  1,200 mg Oral Weekly  . feeding supplement (ENSURE ENLIVE)  237 mL Oral BID BM  . feeding supplement (PRO-STAT SUGAR FREE 64)  30 mL Oral QHS  . fluconazole  400 mg Oral Daily  . lidocaine (PF)  5 mL Intradermal Once  . magnesium oxide  400 mg Oral BID  . mouth rinse  15 mL Mouth Rinse BID  . multivitamin with minerals  1 tablet Oral Daily  . senna-docusate  2 tablet Oral BID  . sodium chloride flush  3 mL Intravenous Q12H  . sulfamethoxazole-trimethoprim  1 tablet Oral Daily  . tamsulosin  0.4 mg Oral Daily   Continuous Infusions: . sodium chloride 1,000 mL (02/02/17 1816)   PRN Meds:.acetaminophen (TYLENOL) oral liquid 160 mg/5 mL, acetaminophen, hydrALAZINE, HYDROcodone-acetaminophen, morphine injection, [DISCONTINUED] ondansetron **OR** ondansetron (ZOFRAN) IV, polyethylene glycol, RESOURCE THICKENUP CLEAR  Micro Results Recent Results (from the past 240 hour(s))  CSF culture     Status: None   Collection Time: 01/25/17  1:33 PM  Result Value Ref Range Status   Specimen Description CSF  Final   Special Requests NONE  Final   Gram Stain   Final    WBC PRESENT, PREDOMINANTLY MONONUCLEAR YEAST CYTOSPIN SMEAR CRITICAL RESULT CALLED TO, READ BACK BY AND VERIFIED WITH: Sharlene Dory RN, AT 1038 01/28/17 BY D. VANHOOK    Culture NO GROWTH 3 DAYS  Final   Report Status 01/29/2017 FINAL  Final  Fungus Culture With  Stain     Status: None (Preliminary result)   Collection Time: 01/25/17  1:33 PM  Result Value Ref Range Status   Fungus Stain Final report  Final    Comment: (NOTE) Performed At: [facility] [place], [place] [id number] Lindon Romp MD [id number]    Fungus (Mycology) Culture PENDING  Incomplete   Fungal Source CSF  Final  Fungus Culture Result     Status: None   Collection Time: 01/25/17  1:33 PM  Result Value Ref Range Status   Result 1 Comment  Final    Comment: (NOTE) KOH/Calcofluor preparation:  no fungus observed. Performed At: [facility] [place], [place] [id number] Lindon Romp MD [id number]   CSF culture     Status: None   Collection Time: 01/30/17  3:54 PM  Result Value Ref Range Status   Specimen Description CSF  Final   Special Requests NONE  Final   Gram Stain   Final    WBC PRESENT, PREDOMINANTLY MONONUCLEAR ENCAPSULATED YEAST SEEN CYTOSPIN SMEAR    Culture NO GROWTH 3 DAYS  Final   Report Status 02/03/2017 FINAL  Final  Fungus Culture With Stain     Status: None (Preliminary result)   Collection Time: 01/30/17  3:54 PM  Result Value Ref Range Status   Fungus Stain Final report  Final    Comment: (NOTE) Performed At: [facility] [place], [place] [id number] Lindon Romp MD [id number]    Fungus (Mycology) Culture PENDING  Incomplete   Fungal Source CSF  Final  Fungus Culture Result     Status: None   Collection Time: 01/30/17  3:54 PM  Result Value Ref Range Status   Result 1 Comment  Final    Comment: (NOTE) KOH/Calcofluor preparation:  no fungus observed. Performed At: [facility] [place], [place] [id number] Lindon Romp MD [id number]     Radiology Reports Ct Head Wo Contrast  Result Date: 01/06/2017 CLINICAL DATA:  Worsening responsiveness.  Meningitis. EXAM: CT HEAD WITHOUT CONTRAST TECHNIQUE: Contiguous axial images were  obtained from the base of the skull through the vertex without intravenous contrast. COMPARISON:  MRI 12/27/2016. CT 12/27/2016. Multiple previous MR and CT exams. FINDINGS: Brain: Since the previous exams, the patient has developed low level hyperdensity within the caudate and putamen on the left and at the left frontoparietal vertex. These findings probably represent petechial bleeding within areas previously shown to be affected by acute infarction. No evidence of macro hemorrhage/hematoma. Other areas of deep brain and cerebral hemispheric infarction are an apparent by CT. No sign of shift, hydrocephalus or extra-axial fluid collection. Vascular: Negative Skull: Negative Sinuses/Orbits: Clear/normal Other: None IMPRESSION: Development of low level hyperdensity in the left caudate, left putamen and at the left posterior frontal vertex, most consistent with petechial hemorrhage within areas of previously demonstrated infarction. No macro hemorrhage/hematoma. No new insult identified otherwise. No mass effect or hydrocephalus. Electronically Signed   By: Nelson Chimes M.D.   On: 01/06/2017 13:04   Dg Chest Port 1 View  Result Date: 01/08/2017 CLINICAL DATA:  HIV disease with meningitis. EXAM: PORTABLE CHEST 1 VIEW COMPARISON:  January 07, 2017 FINDINGS: Feeding tube tip is below the diaphragm. Contrast is seen in the stomach. There is no edema or consolidation. Heart is slightly enlarged with pulmonary vascularity within normal limits. No adenopathy. No bone lesions. IMPRESSION: Stable cardiac prominence. No edema or consolidation. Feeding tube tip below diaphragm. Electronically Signed   By: Lowella Grip III M.D.   On: 01/08/2017 07:26   Dg Chest Port 1 View  Result Date: 01/07/2017 CLINICAL DATA:  Respiratory failure.  HIV positive EXAM: PORTABLE CHEST 1 VIEW COMPARISON:  January 02, 2017 FINDINGS: Endotracheal tube and nasogastric tube have been removed. No pneumothorax. There is no edema or consolidation.  Heart is slightly enlarged with pulmonary vascularity within normal limits. No adenopathy. No bone lesions. IMPRESSION: No pneumothorax. Mild cardiac enlargement. No edema or consolidation. Electronically Signed   By: Lowella Grip III M.D.   On: 01/07/2017 07:56   Dg Abd Portable 1v  Result Date: 01/06/2017 CLINICAL DATA:  Initial evaluation for bowel trouble, perirectal abscess, history of HIV EXAM: PORTABLE ABDOMEN - 1 VIEW COMPARISON:  Prior CT from 12/20/2016. FINDINGS: Bowel gas pattern within normal limits without obstruction or ileus. No abnormal bowel wall thickening. No appreciable free  air on this limited supine view the abdomen. Catheter tubing overlies the lower mid abdomen. No soft tissue mass or abnormal calcification. Visualized lung bases are clear. Visualized osseous structures unremarkable. IMPRESSION: Nonobstructive bowel gas pattern with no radiographic evidence for acute intra-abdominal process. Electronically Signed   By: Jeannine Boga M.D.   On: 01/06/2017 22:22   Dg Swallowing Func-speech Pathology  Result Date: 01/10/2017 Objective Swallowing Evaluation: Type of Study: MBS-Modified Barium Swallow Study Patient Details Name: Georges Victorio MRN: [id number] Date of Birth: 19-Dec-1985 Today's Date: 01/10/2017 Time: SLP Start Time (ACUTE ONLY): 1035-SLP Stop Time (ACUTE ONLY): 1100 SLP Time Calculation (min) (ACUTE ONLY): 25 min Past Medical History: Past Medical History: Diagnosis Date . HIV (human immunodeficiency virus infection) ([place])  . Perirectal abscess  Past Surgical History: Past Surgical History: Procedure Laterality Date . INCISION AND DRAINAGE PERIRECTAL ABSCESS   . INCISION AND DRAINAGE PERIRECTAL ABSCESS N/A 12/21/2016  Procedure: IRRIGATION AND DEBRIDEMENT PERIRECTAL ABSCESS;  Surgeon: Ralene Ok, MD;  Location: [facility];  Service: General;  Laterality: N/A; . PLACEMENT OF LUMBAR DRAIN N/A 12/27/2016  Procedure: PLACEMENT OF LUMBAR DRAIN;  Surgeon: Consuella Lose, MD;  Location: [place] [facility];  Service: Neurosurgery;  Laterality: N/A; HPI: Patient is a 31 y/o male presents with sepsis secondary to perirectal abscess s/p I&D 5/23 and disseminated cryptococcal infection including meningitis, bacteremia, and likely pneumonia. Intubated 5/28-6/5. 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 Second MRI- extension of the diffusion restriction in the left basal ganglia and right basal ganglia. s/p Lumbar drain 5/30. 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 Inconsistent swallow trigger noted, with occasional penetration above the cords before and during the swallow. This penetrate, combined with mild to trace vallecular and lateral channeal residuals do result in trace coating of the traceheal wall post swallow (mixed with saliva) without sensation. The pt is able to complete multiple swallows to clear most residue spontaneously, but a cued intermittent throat clear is also beneficial to clear airway. Overall the pt is likely to experience trace aspiration events, but anticipate improvment in function with use of swallow mechanism and traning for compensatory strategies. Recommend pt also incorporate respiratory muscle strength training to improve pharyngeal musculature and force of expectoration. Will initiate a dys 1 (puree) diet with honey thick liquids with potential for upgrade, particularly of solids as pt demosntrates tolerance and consistency with strategies.  SLP Visit Diagnosis Dysphagia, oropharyngeal phase (R13.12) Attention and concentration deficit following -- Frontal lobe and executive function deficit following -- Impact on  safety and function Moderate aspiration risk   CHL IP TREATMENT RECOMMENDATION 01/07/2017 Treatment Recommendations Therapy as outlined in treatment plan below   Prognosis 01/07/2017 Prognosis for Safe Diet Advancement Good Barriers to Reach Goals Cognitive deficits Barriers/Prognosis Comment -- CHL IP DIET RECOMMENDATION 01/10/2017 SLP Diet Recommendations Dysphagia 1 (Puree) solids;Honey thick liquids Liquid Administration via Cup;Spoon Medication Administration Crushed with puree Compensations Minimize environmental distractions;Slow rate;Small sips/bites;Multiple dry swallows after each bite/sip;Clear throat intermittently;Effortful swallow Postural Changes --   CHL IP OTHER RECOMMENDATIONS 01/10/2017 Recommended Consults -- Oral Care Recommendations Oral care BID Other Recommendations --   CHL IP FOLLOW UP RECOMMENDATIONS 01/10/2017 Follow up Recommendations Inpatient Rehab   CHL IP FREQUENCY AND DURATION 01/10/2017 Speech Therapy Frequency (ACUTE  ONLY) min 3x week Treatment Duration 2 weeks      CHL IP ORAL PHASE 01/10/2017 Oral Phase WFL Oral - Pudding Teaspoon -- Oral - Pudding Cup -- Oral - Honey Teaspoon -- Oral - Honey Cup -- Oral - Nectar Teaspoon -- Oral - Nectar Cup -- Oral - Nectar Straw -- Oral - Thin Teaspoon -- Oral - Thin Cup -- Oral - Thin Straw -- Oral - Puree -- Oral - Mech Soft Impaired mastication;Weak lingual manipulation Oral - Regular -- Oral - Multi-Consistency -- Oral - Pill -- Oral Phase - Comment --  CHL IP PHARYNGEAL PHASE 01/10/2017 Pharyngeal Phase Impaired Pharyngeal- Pudding Teaspoon -- Pharyngeal -- Pharyngeal- Pudding Cup -- Pharyngeal -- Pharyngeal- Honey Teaspoon Penetration/Aspiration during swallow;Penetration/Aspiration before swallow;Reduced epiglottic inversion;Reduced tongue base retraction;Pharyngeal residue - valleculae;Pharyngeal residue - pyriform;Delayed swallow initiation-pyriform sinuses Pharyngeal Material enters airway, remains ABOVE vocal cords and not ejected  out;Material does not enter airway;Material enters airway, CONTACTS cords and not ejected out Pharyngeal- Honey Cup NT Pharyngeal -- Pharyngeal- Nectar Teaspoon Penetration/Aspiration during swallow;Penetration/Aspiration before swallow;Reduced epiglottic inversion;Reduced tongue base retraction;Pharyngeal residue - valleculae;Pharyngeal residue - pyriform;Delayed swallow initiation-pyriform sinuses Pharyngeal Material enters airway, CONTACTS cords and not ejected out;Material enters airway, passes BELOW cords and not ejected out despite cough attempt by patient;Material does not enter airway;Material enters airway, remains ABOVE vocal cords and not ejected out Pharyngeal- Nectar Cup Penetration/Aspiration during swallow;Penetration/Aspiration before swallow;Reduced epiglottic inversion;Reduced tongue base retraction;Pharyngeal residue - valleculae;Pharyngeal residue - pyriform;Delayed swallow initiation-pyriform sinuses Pharyngeal Material enters airway, CONTACTS cords and not ejected out;Material enters airway, passes BELOW cords and not ejected out despite cough attempt by patient;Material does not enter airway;Material enters airway, remains ABOVE vocal cords and not ejected out Pharyngeal- Nectar Straw Penetration/Aspiration during swallow;Penetration/Aspiration before swallow;Reduced epiglottic inversion;Reduced tongue base retraction;Pharyngeal residue - valleculae;Pharyngeal residue - pyriform;Delayed swallow initiation-pyriform sinuses Pharyngeal Material enters airway, CONTACTS cords and not ejected out;Material enters airway, passes BELOW cords and not ejected out despite cough attempt by patient;Material does not enter airway;Material enters airway, remains ABOVE vocal cords and not ejected out Pharyngeal- Thin Teaspoon -- Pharyngeal -- Pharyngeal- Thin Cup Penetration/Aspiration during swallow;Penetration/Aspiration before swallow;Reduced epiglottic inversion;Reduced tongue base retraction;Pharyngeal  residue - valleculae;Pharyngeal residue - pyriform;Delayed swallow initiation-pyriform sinuses Pharyngeal Material enters airway, CONTACTS cords and not ejected out;Material enters airway, passes BELOW cords and not ejected out despite cough attempt by patient;Material does not enter airway;Material enters airway, remains ABOVE vocal cords and not ejected out Pharyngeal- Thin Straw Penetration/Aspiration during swallow;Penetration/Aspiration before swallow;Reduced epiglottic inversion;Reduced tongue base retraction;Pharyngeal residue - valleculae;Pharyngeal residue - pyriform;Delayed swallow initiation-pyriform sinuses Pharyngeal Material enters airway, CONTACTS cords and not ejected out;Material enters airway, passes BELOW cords and not ejected out despite cough attempt by patient;Material does not enter airway;Material enters airway, remains ABOVE vocal cords and not ejected out Pharyngeal- Puree Reduced epiglottic inversion;Reduced tongue base retraction;Pharyngeal residue - valleculae;Pharyngeal residue - pyriform;Delayed swallow initiation-pyriform sinuses Pharyngeal Material enters airway, remains ABOVE vocal cords and not ejected out;Material enters airway, remains ABOVE vocal cords then ejected out;Material does not enter airway Pharyngeal- Mechanical Soft Reduced epiglottic inversion;Reduced tongue base retraction;Pharyngeal residue - valleculae;Pharyngeal residue - pyriform;Delayed swallow initiation-pyriform sinuses Pharyngeal -- Pharyngeal- Regular -- Pharyngeal -- Pharyngeal- Multi-consistency -- Pharyngeal -- Pharyngeal- Pill -- Pharyngeal -- Pharyngeal Comment --  No flowsheet data found. No flowsheet data found. [name], MA CCC-SLP [phone] [name] 01/10/2017, 11:22 AM              Dg Fluoro Guide Lumbar Puncture  Result Date: 01/30/2017 CLINICAL DATA:  HIV. EXAM: DIAGNOSTIC LUMBAR PUNCTURE UNDER FLUOROSCOPIC GUIDANCE FLUOROSCOPY TIME:  Fluoroscopy Time:  1 minutes and 12  seconds Radiation Exposure Index (if provided by the fluoroscopic device): Not applicable. Number of Acquired Spot Images: 1 PROCEDURE: Informed consent was obtained from the patient's mother prior to the procedure, including potential complications of headache, allergy, and pain. With the patient prone, the lower back was prepped with Betadine. 1% Lidocaine was used for local anesthesia. Lumbar puncture was performed at the L2-3 level using a 20 gauge needle with return of clear CSF with an opening pressure of 9 cm water. 3.5 ml of CSF were obtained for laboratory studies. The patient tolerated the procedure well and there were no apparent complications. Further fluid could not be obtained secondary to patient's clinical status and low pressure. Closing pressure was not performed secondary to low pressure throughout. IMPRESSION: Non complicated lumbar puncture as detailed above. Electronically Signed   By: Abigail Miyamoto M.D.   On: 01/30/2017 16:26   Dg Fluoro Guide Lumbar Puncture  Result Date: 01/25/2017 Etheleen Mayhew, MD     01/25/2017  1:46 PM Lumbar puncture performed at L2-L3.  Opening pressure 22 cm H20.  22 mL clear CSF obtained and sent to lab for testing.  Please see full dictation in PACS for additional details.    Time Spent in minutes  30   Jani Gravel M.D on 02/03/2017 at 6:52 PM  Between 7am to 7pm - Pager - [phone]  After 7pm go to [email or web address] - password [facility]  [facility] -  Office  [phone]

## 2017-02-03 NOTE — Progress Notes (Signed)
Progress Note for early pm.  Emphasis on transition to EOB, sitting balance and standing trials to attain upright posture in the STEDY   02/03/17 1430  PT Visit Information  Last PT Received On 02/03/17  Assistance Needed +2  History of Present Illness Patient is a 31 y/o male presents with sepsis secondary to perirectal abscess s/p I&D 5/23 and disseminated cryptococcal infection including meningitis, bacteremia, and likely pneumonia. Intubated 5/28-6/5. Pt found to have acute ischemic strokes. 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 Second MRI- extension of the diffusion restriction in the left basal ganglia and right basal ganglia. s/p Lumbar drain 5/30. PMH includes advanced hiv disease  Subjective Data  Subjective pt not verbal, small head nods and shakes  Patient Stated Goal did not state  Precautions  Precautions Fall  Pain Assessment  Pain Assessment Faces  Faces Pain Scale 4  Pain Location buttocks  Pain Descriptors / Indicators Sore  Pain Intervention(s) Monitored during session  Cognition  Arousal/Alertness Awake/alert  Behavior During Therapy Flat affect  Overall Cognitive Status Impaired/Different from baseline  Area of Impairment Following commands;Problem solving  Orientation Level Disoriented to;Time;Situation  Current Attention Level Sustained  Memory Decreased short-term memory  Following Commands Follows one step commands with increased time;Follows one step commands inconsistently  Safety/Judgement Decreased awareness of deficits  Awareness Intellectual  Problem Solving Slow processing;Decreased initiation;Difficulty sequencing;Requires verbal cues;Requires tactile cues  Bed Mobility  Bed Mobility Supine to Sit  Sidelying to sit Max assist;+2 for physical assistance  General bed mobility comments Cued in sequence of activity.  Assist legs and bridging.  Assisted trunk forward and up.  Pt support with L UE   Transfers  Overall transfer level Needs assistance  Equipment used Ambulation equipment used  Transfer via Actuary  Transfers Sit to/from Stand  Sit to Stand Max assist  General transfer comment See standing  comments.  Pt pulled up in [place] to full stand x3  Ambulation/Gait  General Gait Details not able yet  Modified Rankin (Stroke Patients Only)  Pre-Morbid Rankin Score 0  Modified Rankin 5  Balance  Overall balance assessment Needs assistance  Sitting-balance support Feet supported  Sitting balance-Leahy Scale Zero  Sitting balance - Comments worked on sitting balance at EOB using L UE to supplement balance, but often he used it to push R and it had to be inhibited from pushing while woring with the trunk  Postural control Right lateral lean  Standing balance support Bilateral upper extremity supported  Standing balance-Leahy Scale Zero  Standing balance comment standing x3 in the STEDY with significant assist, but pt able to attain full upright stance all three trials.  Exercises  Exercises General Upper Extremity  Other Exercises  Other Exercises ROM to all 4's with assist and stretch to R side and AROM to R LE and AROM and stretch into extension of L UE.  PT - End of Session  Activity Tolerance Patient tolerated treatment well  Patient left in chair;with call bell/phone within reach;with chair alarm set  Nurse Communication Mobility status;Need for lift equipment  PT - Assessment/Plan  PT Plan Current plan remains appropriate  PT Visit Diagnosis Unsteadiness on feet (R26.81);Other abnormalities of gait and mobility (R26.89);Muscle weakness (generalized) (M62.81)  Hemiplegia - Right/Left Left  Hemiplegia - caused by Cerebral infarction  PT Frequency (ACUTE ONLY) Min 3X/week  Follow Up Recommendations SNF  PT equipment Other (comment) (TBA nex venue)  AM-PAC PT "6 Clicks" Daily Activity Outcome  Measure  Difficulty turning over in bed (including adjusting  bedclothes, sheets and blankets)? 1  Difficulty moving from lying on back to sitting on the side of the bed?  1  Difficulty sitting down on and standing up from a chair with arms (e.g., wheelchair, bedside commode, etc,.)? 1  Help needed moving to and from a bed to chair (including a wheelchair)? 1  Help needed walking in hospital room? 1  Help needed climbing 3-5 steps with a railing?  1  6 Click Score 6  Mobility G Code  CN  PT Goal Progression  Progress towards PT goals Progressing toward goals  PT Time Calculation  PT Start Time (ACUTE ONLY) 1347  PT Stop Time (ACUTE ONLY) 1420  PT Time Calculation (min) (ACUTE ONLY) 33 min  PT General Charges  $$ ACUTE PT VISIT 1 Procedure  PT Treatments  $Therapeutic Activity 23-37 mins   02/03/2017  Donnella Sham, PT [phone]  (pager)

## 2017-02-04 DIAGNOSIS — D62 Acute posthemorrhagic anemia: Secondary | ICD-10-CM

## 2017-02-04 LAB — COMPREHENSIVE METABOLIC PANEL
ALBUMIN: 2.6 g/dL — AB (ref 3.5–5.0)
ALK PHOS: 96 U/L (ref 38–126)
ALT: 23 U/L (ref 17–63)
AST: 26 U/L (ref 15–41)
Anion gap: 7 (ref 5–15)
BILIRUBIN TOTAL: 0.4 mg/dL (ref 0.3–1.2)
BUN: 21 mg/dL — AB (ref 6–20)
CO2: 20 mmol/L — ABNORMAL LOW (ref 22–32)
Calcium: 9.4 mg/dL (ref 8.9–10.3)
Chloride: 104 mmol/L (ref 101–111)
Creatinine, Ser: 0.95 mg/dL (ref 0.61–1.24)
GFR calc Af Amer: >60 mL/min (ref >60)
GFR calc non Af Amer: >60 mL/min (ref >60)
GLUCOSE: 91 mg/dL (ref 65–99)
POTASSIUM: 4.2 mmol/L (ref 3.5–5.1)
Sodium: 131 mmol/L — ABNORMAL LOW (ref 135–145)
TOTAL PROTEIN: 9.1 g/dL — AB (ref 6.5–8.1)

## 2017-02-04 LAB — CBC
HEMATOCRIT: 24.3 % — AB (ref 39.0–52.0)
HEMOGLOBIN: 7.6 g/dL — AB (ref 13.0–17.0)
MCH: 27.7 pg (ref 26.0–34.0)
MCHC: 31.3 g/dL (ref 30.0–36.0)
MCV: 88.7 fL (ref 78.0–100.0)
Platelets: 132 10*3/uL — ABNORMAL LOW (ref 150–400)
RBC: 2.74 MIL/uL — ABNORMAL LOW (ref 4.22–5.81)
RDW: 16.8 % — ABNORMAL HIGH (ref 11.5–15.5)
WBC: 3.2 10*3/uL — AB (ref 4.0–10.5)

## 2017-02-04 LAB — PHOSPHORUS: Phosphorus: 5.2 mg/dL — ABNORMAL HIGH (ref 2.5–4.6)

## 2017-02-04 LAB — MAGNESIUM: MAGNESIUM: 1.7 mg/dL (ref 1.7–2.4)

## 2017-02-04 NOTE — Progress Notes (Signed)
Patient ID: William Pena, male   DOB: 1986/06/26, 31 y.o.   MRN: [id number]                                                                PROGRESS NOTE                                                                                                                                                                                                             Patient Demographics:    William Pena, is a 31 y.o. male, DOB - [birth year], [id number]  Admit date - 12/20/2016   Admitting Physician Modena Jansky, MD  Outpatient Primary MD for the patient is [name], [name], MD  LOS - 45  Outpatient Specialists:  Chief Complaint  Patient presents with  . Rectal Bleeding  . Generalized Body Aches       Brief Narrative    HPI on 12/20/2016 by Dr. Stark Klein (surgery) Pt is a 31 yo M with history of recurrent perirectal abscesses. He was treated at [facility] with surgery in January by Dr. Ronita Hipps, but now is incarcerated in [place]. He has had multiple different types of drains, but currently just has a draining seton in place at the site of a fistula. He has been to the ED 3 times in the last week. Due to his current incarceration in [facility], he cannot be transferred to [place]. He reports severe rectal pain. He has had fever to 103 and had sepsis workup this AM at [facility]. He did have a CT on 5/19 that showed small abscess and antibiotics were recommended. He was not able to be compliant with that for unclear reasons. He has also had abdominal pain. [name] also seen at [facility] for his AIDS by Dr. Tobie Poet. [name] frequently homeless as well. [name] reportedly on his HIV meds. Last CD 4 count looks like 1 /microliter (10/04/2016) and last viral load looks like 50,936/mL (08/08/2016).  Interim history During admission he was also noted to have headache, fever, chills, night sweats, neck pain and lymphadenopathy. ID was consulted and recommended imaging of chest, LP and cultures be  sent from perirectal I&D. Also his serum cryptococcal antigen titer was elevated raising the possibility of disseminated cryptococcal infection including lung involvement. Pulmonology was consulted  by ID for bronchoscopy and washings in an effort to help determine if his CT changes reflect TB or possible pulmonary cryptococcal disease. Initially unable to perform bronchoscopy due to hypoxia. 5/28 patient had altered LOC, became very lethargic/obtunded requiring CT and LP, complicated by acute respiratory failure due to altered mental status that required mechanical ventilation. He had a lumbar CSF drain to depression arise. Now extubated, drain removed. Failed swallowing evaluation and has Cor trak forte tube feeds. Mental status slightly better but waxing and waning. ID, neurology, neurosurgery and CCM consulted during hospital course. Care transferred to stepdown and [place] on 01/09/17. MS slowly improving. Cor Trak discontinued 6/12 and diet initiated. Transferred to medical bed 6/13.Acute kidney injury on 6/14 (creatinine 2.8), likely due to acute urinary retention & Amphotericin> resolved. Antimicrobials per ID   Subjective:    [name] today is sitting in bed, eyes open, reponds to voice.  Pt has family in the room.  Pleased with his progress., overall pt strength improving slightly per CNA.      No headache, No chest pain, No abdominal pain - No Nausea, No new weakness tingling or numbness, No Cough - SOB.    Assessment  & Plan :    Principal Problem:   Perirectal abscess Active Problems:   Chronic pain   AIDS (acquired immune deficiency syndrome) (HCC)   Hyponatremia   Normocytic anemia   Elevated LFTs   Pulmonary infiltrates   Hypomagnesemia   Encephalopathy acute   SIRS (systemic inflammatory response syndrome) (HCC)   Acute respiratory failure (HCC)   Rectal abscess   Cryptococcal meningoencephalitis (HCC)   Disseminated cryptococcosis (HCC)   Cavitary pneumonia   Diffuse  lymphadenopathy   Drug rash   Elevated intracranial pressure   HIV disease (HCC)   Lymphadenopathy of head and neck   HIV (human immunodeficiency virus infection) ([place])   Meningitis   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 Lumbar puncture done on 01/20/2017 showed opening pressure of 31, better than before, it was 55 -CSF gram stain showed yeast -Discussed with infectious disease, Dr. Baxter Flattery. Patient will need repeat lumbar puncture as he did have an elevated opening pressure on previous lumbar puncture. -Interventional radiology consult appreciated, status post lumbar puncture on 01/25/2017. Opening pressure 22. CSF obtained and sent to the lab, results pending. -Discussed with Dr. Baxter Flattery, since patient is currently asymptomatic and alert, will defer VP shunt for now -s/p LP on 01/30/2017 by IR, with opening pressure 9cm. 3.5cc obtained.   CSF culture 6/22=> ngtd CSF culture 6/27=> ngtd CSF culture 7/2=> ngtd  No antiretroviral x4 weeks per ID Continue fluconazole for 4 more weeks til 8/3 and then restart antiretroviral therapy Appreciate ID input   Acute urinary retention -Continue Foley catheter and Flomax -Patient failed voiding trial twice Leave in foley due to wound  Acute kidney injury -Likely secondary to urinary retention and possibly amphotericin -Creatinine peaked to 2.84, currently 0.94 -Continue to monitor BMP  Hyponatremia Stable  Severe Protein Calorie  Malnutrition prostat  Acute hypoxic respiratory failure secondary cryptococcal pneumonia -Required intubation,  was extubated on 01/03/2017 -Patient currently on room air  Acute encephalopathy -Likely multi-factorial including cryptococcal meningitis, acute strokes, metabolic arrangements, medication versus respiratory etiology  -Has been improving slowly.  -Per mother at bedside, patient's baseline is normally very active , he is gradually improving  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  -Neurology consulted and appreciated -No role for statins or antiplatelets in the setting continue antifungal treatment   HIV/AIDS -Infectious disease following -CD4 count on admission 21, high viral load greater than 1 million -Continue prophylaxis with Bactrim and azithromycin -ART not initiated due to high risk of IR IS  Perirectal abscess with pain -Patient initially admitted by general surgery, status post I+D -Continue dressing changes twice a day -Continue wound care -Continue pain control with both IV and oral formulations  Genital herpes -Continue valtrex 1g BID (started on 6/28) for 7 days then needs chronic suppression   Hypomagnesemia -was given 4g IV on 01/30/17 -Continue oral supplementation (would not increase PO as this could lead to diarrhea) -Continue to monitor   Diarrhea -Stool culture negative, resolved  Anemia of chronic disease -Hemoglobin 7.7this morning (has been around 8 for the past several days, however dropped to 7.6 on 01/29/17) -If hemoglobin continues to drop closer to 7, would transfuse. -Continue to monitor CBC  Hypertension -Continue IV hydralazine as needed  Thrombocytopenia -Unknown etiology, possibly medication effect -Currently stable, no reports of bleeding -Platelets 165 -Continue to monitor CBC  Facial swelling -Appears to have resolved, likely related to  hypoalbuminemia  Constipation -Continue bowel regimen  Physical deconditioning -Compensated by patient's comorbidities as well as prolonged hospitalization.  -Physical therapy assessed the patient, recommending SNF -SW consulted for placement, however, difficult to place at this time -Speech therapy assessed patient, recommended regular diet, thin liquids  Severe malnutrition  -in the context of chronic illness -nutrition consulted -continue feeding supplementation  DVT ProphylaxisSCDs  Code Status:Full  Family Communication:Mother at bedside  Disposition Plan:Admitted. Dispo to SNF when stable and available  Consultants PCCM Infectious disease Interventional radiology General surgery Neurology Neurosurgery  Procedures  Lumbar puncture Lumbar drain placement on 5/28, discontinued 6/10 Intubation/extubation Cor Trak discontinued 01/10/2017 Lumbar puncture by interventional radiology on 01/25/2017 and 01/30/2017      Lab Results  Component Value Date   PLT 132 (L) 02/04/2017      Anti-infectives    Start     Dose/Rate Route Frequency Ordered Stop   01/26/17 1545  valACYclovir (VALTREX) tablet 1,000 mg     1,000 mg Oral 2 times daily 01/26/17 1535 01/29/17 0816   01/24/17 1100  fluconazole (DIFLUCAN) tablet 400 mg     400 mg Oral Daily 01/24/17 1059     01/19/17 1530  fluconazole (DIFLUCAN) IVPB 400 mg  Status:  Discontinued     400 mg 100 mL/hr over 120 Minutes Intravenous Every 24 hours 01/19/17 1442 01/24/17 1059   01/16/17 2200  sulfamethoxazole-trimethoprim (BACTRIM DS,SEPTRA DS) 800-160 MG per tablet 1 tablet     1 tablet Oral Daily 01/16/17 2035     01/13/17 1000  sulfamethoxazole-trimethoprim (BACTRIM,SEPTRA) 200-40 MG/5ML suspension 10 mL  Status:  Discontinued     10 mL Oral Daily 01/12/17 0952 01/12/17 1149   01/12/17 1300  amphotericin B liposome (AMBISOME) 240 mg in dextrose 5 % 500 mL IVPB  Status:  Discontinued     240 mg 250  mL/hr over 120 Minutes Intravenous Every 24 hours 01/12/17 1155 01/12/17 1203   01/12/17 1300  amphotericin B liposome (AMBISOME) 240 mg in dextrose 5 % 500 mL IVPB  Status:  Discontinued     240 mg 250 mL/hr over 120 Minutes Intravenous Every 24 hours 01/12/17 1203 01/19/17 1442   01/12/17 1200  flucytosine (ANCOBON) capsule 1,500 mg  Status:  Discontinued     1,500 mg Oral Every 6 hours 01/12/17 0952 01/19/17 1442   01/06/17 1000  azithromycin (ZITHROMAX) tablet 1,200 mg     1,200 mg Oral Weekly 01/04/17 2339     01/06/17 1000  amphotericin B liposome (AMBISOME) 240 mg in dextrose 5 % 500 mL IVPB  Status:  Discontinued     4 mg/kg  59.8 kg 250 mL/hr over 120 Minutes Intravenous Every 24 hours 01/06/17 0815 01/12/17 1155   12/31/16 1000  sulfamethoxazole-trimethoprim (BACTRIM,SEPTRA) 200-40 MG/5ML suspension 10 mL  Status:  Discontinued     10 mL Per Tube Daily 12/30/16 1418 01/12/17 0952   12/31/16 1000  amphotericin B liposome (AMBISOME) 270 mg in dextrose 5 % 500 mL IVPB  Status:  Discontinued     4 mg/kg  67.6 kg 250 mL/hr over 120 Minutes Intravenous Every 24 hours 12/30/16 1510 01/06/17 0815   12/30/16 1000  azithromycin (ZITHROMAX) 200 MG/5ML suspension 1,200 mg  Status:  Discontinued     1,200 mg Per Tube Every Fri 12/26/16 2030 01/04/17 2339   12/29/16 1000  sulfamethoxazole-trimethoprim (BACTRIM,SEPTRA) 200-40 MG/5ML suspension 20 mL  Status:  Discontinued     20 mL Per Tube Daily 12/28/16 1226 12/30/16 1418   12/28/16 1000  amphotericin B liposome (AMBISOME) 240 mg in dextrose 5 % 500 mL IVPB  Status:  Discontinued     3.5 mg/kg  67.6 kg 250 mL/hr over 120 Minutes Intravenous Every 24 hours 12/28/16 0853 12/30/16 1510   12/28/16 0900  amphotericin B liposome (AMBISOME) 240 mg in dextrose 5 % 500 mL IVPB  Status:  Discontinued     3.5 mg/kg  67.6 kg 250 mL/hr over 120 Minutes Intravenous Every 24 hours 12/28/16 0832 12/28/16 0853   12/27/16 0000  flucytosine (ANCOBON) capsule  1,500 mg  Status:  Discontinued     1,500 mg Per Tube Every 6 hours 12/26/16 1909 01/12/17 0952   12/26/16 2200  sulfamethoxazole-trimethoprim (BACTRIM DS,SEPTRA DS) 800-160 MG per tablet 1 tablet  Status:  Discontinued     1 tablet Per Tube Every 12 hours 12/26/16 1909 12/26/16 1912   12/26/16 2200  valACYclovir (VALTREX) tablet 1,000 mg     1,000 mg Per Tube 2 times daily 12/26/16 1909 01/01/17 2359   12/26/16 2200  sulfamethoxazole-trimethoprim (BACTRIM,SEPTRA) 200-40 MG/5ML suspension 20 mL  Status:  Discontinued     20 mL Per Tube Every 12 hours 12/26/16 1912 12/28/16 1226   12/23/16 2200  sulfamethoxazole-trimethoprim (BACTRIM DS,SEPTRA DS) 800-160 MG per tablet 1 tablet  Status:  Discontinued     1 tablet Oral Every 12 hours 12/23/16 1822 12/26/16 1909   12/23/16 1900  cefTRIAXone (ROCEPHIN) 2 g in dextrose 5 % 50 mL IVPB  Status:  Discontinued     2 g 100 mL/hr over 30 Minutes Intravenous Every 24 hours 12/23/16 1821 12/28/16 1122   12/23/16 0900  amphotericin B liposome (AMBISOME) 200 mg in dextrose 5 % 500 mL IVPB  Status:  Discontinued     200 mg 250 mL/hr over 120 Minutes Intravenous Every 24 hours 12/22/16 0919 12/28/16 0832   12/23/16 0800  azithromycin (ZITHROMAX) tablet 1,200 mg  Status:  Discontinued  1,200 mg Oral Weekly 12/22/16 0811 12/26/16 2029   12/22/16 2200  valACYclovir (VALTREX) tablet 1,000 mg  Status:  Discontinued     1,000 mg Oral 2 times daily 12/22/16 1611 12/26/16 1909   12/22/16 1800  amoxicillin-clavulanate (AUGMENTIN) 875-125 MG per tablet 1 tablet  Status:  Discontinued     1 tablet Oral 2 times daily with meals 12/22/16 1446 12/23/16 1821   12/22/16 1800  doxycycline (VIBRA-TABS) tablet 100 mg  Status:  Discontinued     100 mg Oral 2 times daily with meals 12/22/16 1446 12/23/16 1821   12/22/16 1200  flucytosine (ANCOBON) capsule 1,500 mg  Status:  Discontinued     1,500 mg Oral Every 6 hours 12/22/16 0614 12/26/16 1909   12/22/16 1000  azithromycin  (ZITHROMAX) tablet 250 mg  Status:  Discontinued     250 mg Oral Daily 12/21/16 0224 12/21/16 1131   12/22/16 1000  doxycycline (VIBRA-TABS) tablet 100 mg  Status:  Discontinued     100 mg Oral Every 12 hours 12/22/16 0942 12/22/16 1446   12/22/16 0000  amphotericin B liposome (AMBISOME) 190 mg in dextrose 5 % 500 mL IVPB  Status:  Discontinued     3 mg/kg  64.2 kg 250 mL/hr over 120 Minutes Intravenous Every 24 hours 12/21/16 2313 12/22/16 0919   12/22/16 0000  flucytosine (ANCOBON) capsule 250 mg  Status:  Discontinued     250 mg Oral Every 6 hours 12/21/16 2314 12/21/16 2325   12/22/16 0000  flucytosine (ANCOBON) capsule 1,500 mg  Status:  Discontinued     1,500 mg Oral Every 6 hours 12/21/16 2326 12/22/16 0614   12/21/16 1645  cefTRIAXone (ROCEPHIN) 2 g in dextrose 5 % 50 mL IVPB  Status:  Discontinued     2 g 100 mL/hr over 30 Minutes Intravenous Every 24 hours 12/21/16 1539 12/22/16 1446   12/21/16 1645  metroNIDAZOLE (FLAGYL) IVPB 500 mg  Status:  Discontinued     500 mg 100 mL/hr over 60 Minutes Intravenous Every 8 hours 12/21/16 1539 12/21/16 2348   12/21/16 1600  cefTRIAXone (ROCEPHIN) 2 g in dextrose 5 % 50 mL IVPB  Status:  Discontinued     2 g 100 mL/hr over 30 Minutes Intravenous Every 24 hours 12/21/16 1131 12/21/16 1558   12/21/16 1400  vancomycin (VANCOCIN) IVPB 750 mg/150 ml premix  Status:  Discontinued     750 mg 150 mL/hr over 60 Minutes Intravenous Every 8 hours 12/21/16 0142 12/22/16 0942   12/21/16 1000  sulfamethoxazole-trimethoprim (BACTRIM DS,SEPTRA DS) 800-160 MG per tablet 1 tablet  Status:  Discontinued     1 tablet Oral Daily 12/21/16 0058 12/21/16 0100   12/21/16 1000  abacavir-dolutegravir-lamiVUDine (TRIUMEQ) 600-50-300 MG per tablet 1 tablet  Status:  Discontinued     1 tablet Oral Daily 12/21/16 0204 12/22/16 0903   12/21/16 1000  sulfamethoxazole-trimethoprim (BACTRIM DS,SEPTRA DS) 800-160 MG per tablet 1 tablet  Status:  Discontinued     1 tablet  Oral Daily 12/21/16 0204 12/23/16 1822   12/21/16 0800  ceFEPIme (MAXIPIME) 2 g in dextrose 5 % 50 mL IVPB  Status:  Discontinued     2 g 100 mL/hr over 30 Minutes Intravenous Every 8 hours 12/21/16 0137 12/21/16 1131   12/21/16 0600  piperacillin-tazobactam (ZOSYN) IVPB 3.375 g  Status:  Discontinued     3.375 g 12.5 mL/hr over 240 Minutes Intravenous Every 8 hours 12/21/16 0058 12/21/16 0132   12/21/16 0200  metroNIDAZOLE (FLAGYL) IVPB  500 mg  Status:  Discontinued     500 mg 100 mL/hr over 60 Minutes Intravenous Every 8 hours 12/21/16 0137 12/22/16 1446   12/21/16 0145  ceFEPIme (MAXIPIME) 2 g in dextrose 5 % 50 mL IVPB     2 g 100 mL/hr over 30 Minutes Intravenous  Once 12/21/16 0137 12/21/16 0358   12/21/16 0145  vancomycin (VANCOCIN) IVPB 1000 mg/200 mL premix     1,000 mg 200 mL/hr over 60 Minutes Intravenous  Once 12/21/16 0142 12/21/16 0429   12/21/16 0130  piperacillin-tazobactam (ZOSYN) IVPB 3.375 g  Status:  Discontinued     3.375 g 100 mL/hr over 30 Minutes Intravenous  Once 12/21/16 0115 12/21/16 0132   12/21/16 0130  azithromycin (ZITHROMAX) powder 1 g     1 g Oral  Once 12/21/16 0125 12/21/16 0328        Objective:   Vitals:   02/03/17 0530 02/03/17 1633 02/03/17 2132 02/04/17 0552  BP: 98/62 118/65 105/67 98/64  Pulse: 79 100 79 81  Resp: 18 16 18 18   Temp: 98.1 F (36.7 C) 98.1 F (36.7 C) 98.2 F (36.8 C) 98.1 F (36.7 C)  TempSrc:  Oral  Oral  SpO2: 100% 100% 100% 99%  Weight:      Height:        Wt Readings from Last 3 Encounters:  01/11/17 62 kg (136 lb 11 oz)  12/20/16 65.8 kg (145 lb)  12/17/16 65.8 kg (145 lb)     Intake/Output Summary (Last 24 hours) at 02/04/17 0818 Last data filed at 02/03/17 2206  Gross per 24 hour  Intake                0 ml  Output              700 ml  Net             -700 ml     Physical Exam  Awake Alert, Oriented X 3, No new F.N deficits, Normal affect [place].AT,PERRAL Supple Neck,No JVD, No cervical  lymphadenopathy appriciated.  Symmetrical Chest wall movement, Good air movement bilaterally, CTAB RRR,No Gallops,Rubs or new Murmurs, No Parasternal Heave +ve B.Sounds, Abd Soft, No tenderness, No organomegaly appriciated, No rebound - guarding or rigidity. No Cyanosis, Clubbing or edema, No new Rash or bruise   3cm round sacral decub, clean  Evidence of I and D, w packing    Data Review:    CBC  Recent Labs Lab 01/30/17 0603 01/31/17 0359 02/01/17 0652 02/03/17 0552 02/04/17 0349  WBC 2.9* 2.5* 2.6* 2.7* 3.2*  HGB 7.9* 7.7* 7.7* 7.6* 7.6*  HCT 25.3* 24.6* 24.5* 24.2* 24.3*  PLT 161 165 159 147* 132*  MCV 91.0 88.2 88.4 88.6 88.7  MCH 28.4 27.6 27.8 27.8 27.7  MCHC 31.2 31.3 31.4 31.4 31.3  RDW 17.4* 17.1* 16.9* 16.9* 16.8*    Chemistries   Recent Labs Lab 01/31/17 0359 02/01/17 0652 02/02/17 0735 02/03/17 0552 02/04/17 0349  NA 131* 131* 134* 132* 131*  K 4.3 4.1 4.0 4.4 4.2  CL 103 103 106 104 104  CO2 21* 23 22 21* 20*  GLUCOSE 77 91 86 91 91  BUN 19 19 18  23* 21*  CREATININE 0.94 1.06 1.02 1.03 0.95  CALCIUM 9.2 9.2 9.1 9.2 9.4  MG 1.9 1.7 1.5* 1.6* 1.7  AST  --   --   --  31 26  ALT  --   --   --  27  23  ALKPHOS  --   --   --  84 96  BILITOT  --   --   --  0.5 0.4   ------------------------------------------------------------------------------------------------------------------ No results for input(s): CHOL, HDL, LDLCALC, TRIG, CHOLHDL, LDLDIRECT in the last 72 hours.  No results found for: HGBA1C ------------------------------------------------------------------------------------------------------------------ No results for input(s): TSH, T4TOTAL, T3FREE, THYROIDAB in the last 72 hours.  Invalid input(s): FREET3 ------------------------------------------------------------------------------------------------------------------ No results for input(s): VITAMINB12, FOLATE, FERRITIN, TIBC, IRON, RETICCTPCT in the last 72 hours.  Coagulation  profile No results for input(s): INR, PROTIME in the last 168 hours.  No results for input(s): DDIMER in the last 72 hours.  Cardiac Enzymes No results for input(s): CKMB, TROPONINI, MYOGLOBIN in the last 168 hours.  Invalid input(s): CK ------------------------------------------------------------------------------------------------------------------ No results found for: BNP  Inpatient Medications  Scheduled Meds: . amantadine  100 mg Oral BID WC  . azithromycin  1,200 mg Oral Weekly  . feeding supplement (ENSURE ENLIVE)  237 mL Oral BID BM  . feeding supplement (PRO-STAT SUGAR FREE 64)  30 mL Oral QHS  . fluconazole  400 mg Oral Daily  . lidocaine (PF)  5 mL Intradermal Once  . magnesium oxide  400 mg Oral BID  . mouth rinse  15 mL Mouth Rinse BID  . multivitamin with minerals  1 tablet Oral Daily  . senna-docusate  2 tablet Oral BID  . sodium chloride flush  3 mL Intravenous Q12H  . sulfamethoxazole-trimethoprim  1 tablet Oral Daily  . tamsulosin  0.4 mg Oral Daily   Continuous Infusions: . sodium chloride 1,000 mL (02/02/17 1816)   PRN Meds:.acetaminophen (TYLENOL) oral liquid 160 mg/5 mL, acetaminophen, hydrALAZINE, HYDROcodone-acetaminophen, morphine injection, [DISCONTINUED] ondansetron **OR** ondansetron (ZOFRAN) IV, polyethylene glycol, RESOURCE THICKENUP CLEAR  Micro Results Recent Results (from the past 240 hour(s))  CSF culture     Status: None   Collection Time: 01/25/17  1:33 PM  Result Value Ref Range Status   Specimen Description CSF  Final   Special Requests NONE  Final   Gram Stain   Final    WBC PRESENT, PREDOMINANTLY MONONUCLEAR YEAST CYTOSPIN SMEAR CRITICAL RESULT CALLED TO, READ BACK BY AND VERIFIED WITH: Sharlene Dory RN, AT 1038 01/28/17 BY D. VANHOOK    Culture NO GROWTH 3 DAYS  Final   Report Status 01/29/2017 FINAL  Final  Fungus Culture With Stain     Status: None (Preliminary result)   Collection Time: 01/25/17  1:33 PM  Result Value Ref  Range Status   Fungus Stain Final report  Final    Comment: (NOTE) Performed At: [facility] [place], [place] [id number] Lindon Romp MD [id number]    Fungus (Mycology) Culture PENDING  Incomplete   Fungal Source CSF  Final  Fungus Culture Result     Status: None   Collection Time: 01/25/17  1:33 PM  Result Value Ref Range Status   Result 1 Comment  Final    Comment: (NOTE) KOH/Calcofluor preparation:  no fungus observed. Performed At: [facility] [place], [place] [id number] Lindon Romp MD [id number]   CSF culture     Status: None   Collection Time: 01/30/17  3:54 PM  Result Value Ref Range Status   Specimen Description CSF  Final   Special Requests NONE  Final   Gram Stain   Final    WBC PRESENT, PREDOMINANTLY MONONUCLEAR ENCAPSULATED YEAST SEEN CYTOSPIN SMEAR    Culture NO GROWTH 3 DAYS  Final  Report Status 02/03/2017 FINAL  Final  Fungus Culture With Stain     Status: None (Preliminary result)   Collection Time: 01/30/17  3:54 PM  Result Value Ref Range Status   Fungus Stain Final report  Final    Comment: (NOTE) Performed At: [facility] [place], [place] [id number] Lindon Romp MD [id number]    Fungus (Mycology) Culture PENDING  Incomplete   Fungal Source CSF  Final  Fungus Culture Result     Status: None   Collection Time: 01/30/17  3:54 PM  Result Value Ref Range Status   Result 1 Comment  Final    Comment: (NOTE) KOH/Calcofluor preparation:  no fungus observed. Performed At: [facility] [place], [place] [id number] Lindon Romp MD [id number]     Radiology Reports Ct Head Wo Contrast  Result Date: 01/06/2017 CLINICAL DATA:  Worsening responsiveness.  Meningitis. EXAM: CT HEAD WITHOUT CONTRAST TECHNIQUE: Contiguous axial images were obtained from the base of the skull through the vertex without intravenous contrast. COMPARISON:  MRI  12/27/2016. CT 12/27/2016. Multiple previous MR and CT exams. FINDINGS: Brain: Since the previous exams, the patient has developed low level hyperdensity within the caudate and putamen on the left and at the left frontoparietal vertex. These findings probably represent petechial bleeding within areas previously shown to be affected by acute infarction. No evidence of macro hemorrhage/hematoma. Other areas of deep brain and cerebral hemispheric infarction are an apparent by CT. No sign of shift, hydrocephalus or extra-axial fluid collection. Vascular: Negative Skull: Negative Sinuses/Orbits: Clear/normal Other: None IMPRESSION: Development of low level hyperdensity in the left caudate, left putamen and at the left posterior frontal vertex, most consistent with petechial hemorrhage within areas of previously demonstrated infarction. No macro hemorrhage/hematoma. No new insult identified otherwise. No mass effect or hydrocephalus. Electronically Signed   By: Nelson Chimes M.D.   On: 01/06/2017 13:04   Dg Chest Port 1 View  Result Date: 01/08/2017 CLINICAL DATA:  HIV disease with meningitis. EXAM: PORTABLE CHEST 1 VIEW COMPARISON:  January 07, 2017 FINDINGS: Feeding tube tip is below the diaphragm. Contrast is seen in the stomach. There is no edema or consolidation. Heart is slightly enlarged with pulmonary vascularity within normal limits. No adenopathy. No bone lesions. IMPRESSION: Stable cardiac prominence. No edema or consolidation. Feeding tube tip below diaphragm. Electronically Signed   By: Lowella Grip III M.D.   On: 01/08/2017 07:26   Dg Chest Port 1 View  Result Date: 01/07/2017 CLINICAL DATA:  Respiratory failure.  HIV positive EXAM: PORTABLE CHEST 1 VIEW COMPARISON:  January 02, 2017 FINDINGS: Endotracheal tube and nasogastric tube have been removed. No pneumothorax. There is no edema or consolidation. Heart is slightly enlarged with pulmonary vascularity within normal limits. No adenopathy. No bone  lesions. IMPRESSION: No pneumothorax. Mild cardiac enlargement. No edema or consolidation. Electronically Signed   By: Lowella Grip III M.D.   On: 01/07/2017 07:56   Dg Abd Portable 1v  Result Date: 01/06/2017 CLINICAL DATA:  Initial evaluation for bowel trouble, perirectal abscess, history of HIV EXAM: PORTABLE ABDOMEN - 1 VIEW COMPARISON:  Prior CT from 12/20/2016. FINDINGS: Bowel gas pattern within normal limits without obstruction or ileus. No abnormal bowel wall thickening. No appreciable free air on this limited supine view the abdomen. Catheter tubing overlies the lower mid abdomen. No soft tissue mass or abnormal calcification. Visualized lung bases are clear. Visualized osseous structures unremarkable. IMPRESSION: Nonobstructive bowel gas pattern with no radiographic  evidence for acute intra-abdominal process. Electronically Signed   By: Jeannine Boga M.D.   On: 01/06/2017 22:22   Dg Swallowing Func-speech Pathology  Result Date: 01/10/2017 Objective Swallowing Evaluation: Type of Study: MBS-Modified Barium Swallow Study Patient Details Name: Gautam Langhorst MRN: [id number] Date of Birth: 1986-02-25 Today's Date: 01/10/2017 Time: SLP Start Time (ACUTE ONLY): 1035-SLP Stop Time (ACUTE ONLY): 1100 SLP Time Calculation (min) (ACUTE ONLY): 25 min Past Medical History: Past Medical History: Diagnosis Date . HIV (human immunodeficiency virus infection) ([place])  . Perirectal abscess  Past Surgical History: Past Surgical History: Procedure Laterality Date . INCISION AND DRAINAGE PERIRECTAL ABSCESS   . INCISION AND DRAINAGE PERIRECTAL ABSCESS N/A 12/21/2016  Procedure: IRRIGATION AND DEBRIDEMENT PERIRECTAL ABSCESS;  Surgeon: Ralene Ok, MD;  Location: [facility];  Service: General;  Laterality: N/A; . PLACEMENT OF LUMBAR DRAIN N/A 12/27/2016  Procedure: PLACEMENT OF LUMBAR DRAIN;  Surgeon: Consuella Lose, MD;  Location: [facility];  Service: Neurosurgery;  Laterality: N/A; HPI: Patient is a 31 y/o male  presents with sepsis secondary to perirectal abscess s/p I&D 5/23 and disseminated cryptococcal infection including meningitis, bacteremia, and likely pneumonia. Intubated 5/28-6/5. 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 Second MRI- extension of the diffusion restriction in the left basal ganglia and right basal ganglia. s/p Lumbar drain 5/30. 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 Inconsistent swallow trigger noted, with occasional penetration above the cords before and during the swallow. This penetrate, combined with mild to trace vallecular and lateral channeal residuals do result in trace coating of the traceheal wall post swallow (mixed with saliva) without sensation. The pt is able to complete multiple swallows to clear most residue spontaneously, but a cued intermittent throat clear is also beneficial to clear airway. Overall the pt is likely to experience trace aspiration events, but anticipate improvment in function with use of swallow mechanism and traning for compensatory strategies. Recommend pt also incorporate respiratory muscle strength training to improve pharyngeal musculature and force of expectoration. Will initiate a dys 1 (puree) diet with honey thick liquids with potential for upgrade, particularly of solids as pt demosntrates tolerance and consistency with strategies.  SLP Visit Diagnosis Dysphagia, oropharyngeal phase (R13.12) Attention and concentration deficit following -- Frontal lobe and executive function deficit following -- Impact on safety and function Moderate aspiration risk   CHL IP TREATMENT RECOMMENDATION 01/07/2017 Treatment  Recommendations Therapy as outlined in treatment plan below   Prognosis 01/07/2017 Prognosis for Safe Diet Advancement Good Barriers to Reach Goals Cognitive deficits Barriers/Prognosis Comment -- CHL IP DIET RECOMMENDATION 01/10/2017 SLP Diet Recommendations Dysphagia 1 (Puree) solids;Honey thick liquids Liquid Administration via Cup;Spoon Medication Administration Crushed with puree Compensations Minimize environmental distractions;Slow rate;Small sips/bites;Multiple dry swallows after each bite/sip;Clear throat intermittently;Effortful swallow Postural Changes --   CHL IP OTHER RECOMMENDATIONS 01/10/2017 Recommended Consults -- Oral Care Recommendations Oral care BID Other Recommendations --   CHL IP FOLLOW UP RECOMMENDATIONS 01/10/2017 Follow up Recommendations Inpatient Rehab   CHL IP FREQUENCY AND DURATION 01/10/2017 Speech Therapy Frequency (ACUTE ONLY) min 3x week Treatment Duration 2 weeks      CHL IP ORAL PHASE 01/10/2017 Oral Phase WFL Oral - Pudding Teaspoon -- Oral - Pudding Cup -- Oral - Honey Teaspoon -- Oral - Honey  Cup -- Oral - Nectar Teaspoon -- Oral - Nectar Cup -- Oral - Nectar Straw -- Oral - Thin Teaspoon -- Oral - Thin Cup -- Oral - Thin Straw -- Oral - Puree -- Oral - Mech Soft Impaired mastication;Weak lingual manipulation Oral - Regular -- Oral - Multi-Consistency -- Oral - Pill -- Oral Phase - Comment --  CHL IP PHARYNGEAL PHASE 01/10/2017 Pharyngeal Phase Impaired Pharyngeal- Pudding Teaspoon -- Pharyngeal -- Pharyngeal- Pudding Cup -- Pharyngeal -- Pharyngeal- Honey Teaspoon Penetration/Aspiration during swallow;Penetration/Aspiration before swallow;Reduced epiglottic inversion;Reduced tongue base retraction;Pharyngeal residue - valleculae;Pharyngeal residue - pyriform;Delayed swallow initiation-pyriform sinuses Pharyngeal Material enters airway, remains ABOVE vocal cords and not ejected out;Material does not enter airway;Material enters airway, CONTACTS cords and not ejected out Pharyngeal-  Honey Cup NT Pharyngeal -- Pharyngeal- Nectar Teaspoon Penetration/Aspiration during swallow;Penetration/Aspiration before swallow;Reduced epiglottic inversion;Reduced tongue base retraction;Pharyngeal residue - valleculae;Pharyngeal residue - pyriform;Delayed swallow initiation-pyriform sinuses Pharyngeal Material enters airway, CONTACTS cords and not ejected out;Material enters airway, passes BELOW cords and not ejected out despite cough attempt by patient;Material does not enter airway;Material enters airway, remains ABOVE vocal cords and not ejected out Pharyngeal- Nectar Cup Penetration/Aspiration during swallow;Penetration/Aspiration before swallow;Reduced epiglottic inversion;Reduced tongue base retraction;Pharyngeal residue - valleculae;Pharyngeal residue - pyriform;Delayed swallow initiation-pyriform sinuses Pharyngeal Material enters airway, CONTACTS cords and not ejected out;Material enters airway, passes BELOW cords and not ejected out despite cough attempt by patient;Material does not enter airway;Material enters airway, remains ABOVE vocal cords and not ejected out Pharyngeal- Nectar Straw Penetration/Aspiration during swallow;Penetration/Aspiration before swallow;Reduced epiglottic inversion;Reduced tongue base retraction;Pharyngeal residue - valleculae;Pharyngeal residue - pyriform;Delayed swallow initiation-pyriform sinuses Pharyngeal Material enters airway, CONTACTS cords and not ejected out;Material enters airway, passes BELOW cords and not ejected out despite cough attempt by patient;Material does not enter airway;Material enters airway, remains ABOVE vocal cords and not ejected out Pharyngeal- Thin Teaspoon -- Pharyngeal -- Pharyngeal- Thin Cup Penetration/Aspiration during swallow;Penetration/Aspiration before swallow;Reduced epiglottic inversion;Reduced tongue base retraction;Pharyngeal residue - valleculae;Pharyngeal residue - pyriform;Delayed swallow initiation-pyriform sinuses Pharyngeal  Material enters airway, CONTACTS cords and not ejected out;Material enters airway, passes BELOW cords and not ejected out despite cough attempt by patient;Material does not enter airway;Material enters airway, remains ABOVE vocal cords and not ejected out Pharyngeal- Thin Straw Penetration/Aspiration during swallow;Penetration/Aspiration before swallow;Reduced epiglottic inversion;Reduced tongue base retraction;Pharyngeal residue - valleculae;Pharyngeal residue - pyriform;Delayed swallow initiation-pyriform sinuses Pharyngeal Material enters airway, CONTACTS cords and not ejected out;Material enters airway, passes BELOW cords and not ejected out despite cough attempt by patient;Material does not enter airway;Material enters airway, remains ABOVE vocal cords and not ejected out Pharyngeal- Puree Reduced epiglottic inversion;Reduced tongue base retraction;Pharyngeal residue - valleculae;Pharyngeal residue - pyriform;Delayed swallow initiation-pyriform sinuses Pharyngeal Material enters airway, remains ABOVE vocal cords and not ejected out;Material enters airway, remains ABOVE vocal cords then ejected out;Material does not enter airway Pharyngeal- Mechanical Soft Reduced epiglottic inversion;Reduced tongue base retraction;Pharyngeal residue - valleculae;Pharyngeal residue - pyriform;Delayed swallow initiation-pyriform sinuses Pharyngeal -- Pharyngeal- Regular -- Pharyngeal -- Pharyngeal- Multi-consistency -- Pharyngeal -- Pharyngeal- Pill -- Pharyngeal -- Pharyngeal Comment --  No flowsheet data found. No flowsheet data found. [name], MA CCC-SLP [phone] [name] 01/10/2017, 11:22 AM              Dg Cyndy Freeze Guide Lumbar Puncture  Result Date: 01/30/2017 CLINICAL DATA:  HIV. EXAM: DIAGNOSTIC LUMBAR PUNCTURE UNDER FLUOROSCOPIC GUIDANCE FLUOROSCOPY TIME:  Fluoroscopy Time:  1 minutes and 12 seconds Radiation Exposure Index (if provided by the fluoroscopic device): Not applicable. Number of  Acquired Spot Images: 1 PROCEDURE: Informed consent was obtained from the patient's mother prior to the procedure, including potential complications of headache, allergy, and pain. With the patient prone, the lower back was prepped with Betadine. 1% Lidocaine was used for local anesthesia. Lumbar puncture was performed at the L2-3 level using a 20 gauge needle with return of clear CSF with an opening pressure of 9 cm water. 3.5 ml of CSF were obtained for laboratory studies. The patient tolerated the procedure well and there were no apparent complications. Further fluid could not be obtained secondary to patient's clinical status and low pressure. Closing pressure was not performed secondary to low pressure throughout. IMPRESSION: Non complicated lumbar puncture as detailed above. Electronically Signed   By: Abigail Miyamoto M.D.   On: 01/30/2017 16:26   Dg Fluoro Guide Lumbar Puncture  Result Date: 01/25/2017 Etheleen Mayhew, MD     01/25/2017  1:46 PM Lumbar puncture performed at L2-L3.  Opening pressure 22 cm H20.  22 mL clear CSF obtained and sent to lab for testing.  Please see full dictation in PACS for additional details.    Time Spent in minutes  30   Jani Gravel M.D on 02/04/2017 at 8:18 AM  Between 7am to 7pm - Pager - [phone]  After 7pm go to [email or web address] - password [facility]  [facility] -  Office  [phone]

## 2017-02-05 DIAGNOSIS — N179 Acute kidney failure, unspecified: Secondary | ICD-10-CM

## 2017-02-05 DIAGNOSIS — J984 Other disorders of lung: Secondary | ICD-10-CM

## 2017-02-05 DIAGNOSIS — J189 Pneumonia, unspecified organism: Secondary | ICD-10-CM

## 2017-02-05 LAB — CBC
HCT: 22.4 % — ABNORMAL LOW (ref 39.0–52.0)
HEMOGLOBIN: 7.1 g/dL — AB (ref 13.0–17.0)
MCH: 27.4 pg (ref 26.0–34.0)
MCHC: 30.8 g/dL (ref 30.0–36.0)
MCV: 88.9 fL (ref 78.0–100.0)
PLATELETS: 124 10*3/uL — AB (ref 150–400)
RBC: 2.52 MIL/uL — ABNORMAL LOW (ref 4.22–5.81)
RDW: 16.7 % — AB (ref 11.5–15.5)
WBC: 3.1 10*3/uL — ABNORMAL LOW (ref 4.0–10.5)

## 2017-02-05 LAB — COMPREHENSIVE METABOLIC PANEL
ALK PHOS: 90 U/L (ref 38–126)
ALT: 26 U/L (ref 17–63)
ANION GAP: 5 (ref 5–15)
AST: 32 U/L (ref 15–41)
Albumin: 2.3 g/dL — ABNORMAL LOW (ref 3.5–5.0)
BUN: 17 mg/dL (ref 6–20)
CALCIUM: 8.7 mg/dL — AB (ref 8.9–10.3)
CO2: 20 mmol/L — AB (ref 22–32)
Chloride: 105 mmol/L (ref 101–111)
Creatinine, Ser: 0.9 mg/dL (ref 0.61–1.24)
GFR calc non Af Amer: >60 mL/min (ref >60)
Glucose, Bld: 81 mg/dL (ref 65–99)
POTASSIUM: 4.1 mmol/L (ref 3.5–5.1)
SODIUM: 130 mmol/L — AB (ref 135–145)
Total Bilirubin: 0.4 mg/dL (ref 0.3–1.2)
Total Protein: 8.7 g/dL — ABNORMAL HIGH (ref 6.5–8.1)

## 2017-02-05 LAB — MAGNESIUM: MAGNESIUM: 1.5 mg/dL — AB (ref 1.7–2.4)

## 2017-02-05 LAB — ACID FAST CULTURE WITH REFLEXED SENSITIVITIES: ACID FAST CULTURE - AFSCU3: NEGATIVE

## 2017-02-05 LAB — PHOSPHORUS: Phosphorus: 5.2 mg/dL — ABNORMAL HIGH (ref 2.5–4.6)

## 2017-02-05 LAB — VITAMIN B12: VITAMIN B 12: 1083 pg/mL — AB (ref 180–914)

## 2017-02-05 MED ORDER — VITAMIN B-1 100 MG PO TABS
100.0000 mg | ORAL_TABLET | Freq: Every day | ORAL | Status: DC
  Administered 2017-02-05 – 2017-02-06 (×2): 100 mg via ORAL
  Filled 2017-02-05 (×2): qty 1

## 2017-02-05 NOTE — Progress Notes (Signed)
[facility] PROGRESS NOTE    Progress Note  Gregoire Bennis  XNT:[id number] DOB: [birth year] DOA: 12/20/2016 PCP: [name], MD    Brief Narrative:   William Pena is an 31 y.o. male past medical history of HIV with last CD4 count of 21 who was started on heart therapy 2 months prior to admission, with a viral load of 50,000, recurrent perirectal abscesses was treated to surgery in January 2018, he has now been incarcerated at [facility], he's had multiple different times to drain, on admission had a sudden drain in place at the fistula. In the ED had a fever 103 sepsis workup was performed a CT scan on 12/07/2016 shows no abscesses and antibiotics were recommended, he was noncompliant for unclear reasons. ID was consulted due to headaches, fever and chills and lymphadenopathy of the in culture from perirectal abscesses were sent. History of cryptococcal antigen titer was elevated which raise the possibility of disseminated cryptococcal infection initial LP performed on 12/18/2016, multiple he's performed to decrease intracranial pressure.. Pulmonary was to do a CT scan of the chest that showed bilateral airspace disease with small cavitary lesions in the right upper lung with mediastinal lymphadenopathy. There was a concern of TB versus pulmonary cryptococcal disease, pulmonary was unable to perform bronchoscopy due to hypoxia, the patient later became lethargic and obtunded requiring intubation with mechanical ventilation status post extubation and 01/03/2017. LP was done to decrease intracranial pressure.  Now extubated rectal drains removed. He failed a swallowing evaluation and a feeding tube was placed. ID, critical care neurology and neurosurgery were consulted during hospital stay. [facility]. Mental status has slowly been improving. Core track discontinued and 01/10/2017, since then the patient has tolerated his diet. He also developed  acute kidney injury (with a baseline creatinine of 1) with a creatinine of 2.8 likely due to urinary retention and amphotericin which is now resolved.  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 Currently on Diflucan will need a total of 33 days prior to your recommendations. Currently on day 17. End date 03/03/2017. At this point can't resume NT retroviral therapy. Patient required multiple LP's to decrease intracranial pressure. Last LP performed 01/30/2017 by IR showed an opening pressure of 9 cm of water.CSF fluid staining show yeast cryptococcal neoformans. CSF culture 6/22=> ngtd CSF culture 6/27=> ngtd CSF culture 7/2=> ngtd. ID has recommended to hold antiretroviral therapy for 4 weeks. Patient medically stable for transfer. Awaiting social worker placement.  Acute urinary retention: With Foley in place continue Flomax. Patient has failed voiding trial twice. Tto be reevaluated as  an outpatient.  Acute kidney injury: Likely secondary to acute urinary retention and possible amphotericin  induced injury. Baseline creatinine less than 1, now resolved to 2.8.  Hyponatremia: Resolved now stable.  Severe protein caloric malnutrition: Continue ProStat.  Acute respiratory failure with hypoxia secondary to cryptococcal pneumonia: Requiring intubation, extubated and sessile 2018. Patient currently on Coumadin.  Acute encephalopathy: Limited multifactorial due to cryptococcal encephalitis, acute ischemic CVA metabolic derangements and/or medications with hypoxia. Mother is at that side relates he's closest to baseline.  Acute scheme it CVA: Due to small vessel disease in the setting of cryptococcal infection in a young patient. Neurology was consulted and appreciated. Normal first and no antiplatelet therapy in the setting of cryptococcal infection. Continue antifungal therapy.  HIV/AIDS: Last CD4 count of 21, viral load greater than 1 million. Continue Bactrim and azithromycin prophylaxis. ART therapy to begin on 03/04/2017.  Perirectal abscess with pain: Patient initially admitted by general surgery status post IND, drains have been removed. Continue wound care and pain medication.  Genital herpes: Continue Valtrex for active therapy for 7 days and was started on 01/26/2017. After this would need chronic suppression.  Hypomagnesemia: Continue to supplement orally.  Diarrhea: Likely due to medications stool cultures were negative now resolved.  Anemia of chronic disease: Hemoglobin of 7.1, has slowly been trickling down likely multifactorial in the setting of multiple acute inflammatory processes.  Essential hypertension: At times seems to be controlled, on hydralazine IV when necessary.  Thrombocytopenia: Etiology unknown, possibly medication induced versus multiple acute inflammatory processes. Now resolved left platelet count was 124 continue to monitor closely.  Facial swelling: Likely due to hypoalbuminemia now resolved.  Constipation: Continue bowel  regimen.  Physical deconditioning: Evaluate the patient to recommend a skilled nursing facility. Social course consulted however difficult placement as he is incarcerated. Speech therapy evaluated the patient recommended regular diet.  Severe protein caloric malnutrition: In the setting ongoing chronic illnesses, dietitian was consulted and recommended supplementation.  DVT prophylaxis: lovenox Family Communication:mother Disposition Plan/Barrier to D/C: Awaiting Education officer, museum placement. To skilled nursing facility. Code Status:     Code Status Orders        Start     Ordered   12/21/16 1540  Full code  Continuous     12/21/16 1539    Code Status History    Date Active Date Inactive Code Status Order ID Comments User Context   12/21/2016 12:58 AM 12/21/2016  1:00 AM Full Code [id number]  Stark Klein, MD ED        IV Access:    Peripheral IV   Procedures and diagnostic studies:   No results found.   Medical Consultants:    None.  Anti-Infectives:   Completed 28 day course of amphotericin and flucytosine On oral Diflucan end date 03/03/2017.  Subjective:    Iker Nuttall nonverbal  Objective:    Vitals:   02/04/17 1519 02/04/17 2113 02/05/17 0614 02/05/17 0616  BP: 107/60 (!) 95/53 (!) 93/57 102/66  Pulse: 85 80 78 78  Resp: 17 18 18    Temp: 98.8 F (37.1 C) 98.3 F (36.8 C) 98.3 F (36.8 C)   TempSrc: Oral     SpO2: 100% 100% 100% 100%  Weight:      Height:        Intake/Output Summary (Last 24 hours) at 02/05/17 0727 Last data filed at 02/04/17 1733  Gross per 24 hour  Intake  342 ml  Output             1300 ml  Net             -958 ml   Filed Weights   01/10/17 0900 01/11/17 0640 01/11/17 0905  Weight: 63 kg (138 lb 14.2 oz) 60 kg (132 lb 4.4 oz) 62 kg (136 lb 11 oz)    Exam: General exam: Sleepy Respiratory system: Good air movement and clear to auscultation. Cardiovascular system: Regular rate and rhythm  with positive S1-S2 Gastrointestinal system: Soft nontender nondistended Extremities: No pedal edema. Skin: No rashes, lesions or ulcers   Data Reviewed:    Labs: Basic Metabolic Panel:  Recent Labs Lab 02/01/17 0652 02/02/17 0735 02/03/17 0552 02/04/17 0349 02/05/17 0545  NA 131* 134* 132* 131* 130*  K 4.1 4.0 4.4 4.2 4.1  CL 103 106 104 104 105  CO2 23 22 21* 20* 20*  GLUCOSE 91 86 91 91 81  BUN 19 18 23* 21* 17  CREATININE 1.06 1.02 1.03 0.95 0.90  CALCIUM 9.2 9.1 9.2 9.4 8.7*  MG 1.7 1.5* 1.6* 1.7 1.5*  PHOS 5.3* 4.9* 5.0* 5.2* 5.2*   GFR Estimated Creatinine Clearance: 104.3 mL/min (by C-G formula based on SCr of 0.9 mg/dL). Liver Function Tests:  Recent Labs Lab 02/01/17 4098 02/02/17 0735 02/03/17 0552 02/04/17 0349 02/05/17 0545  AST  --   --  31 26 32  ALT  --   --  27 23 26   ALKPHOS  --   --  84 96 90  BILITOT  --   --  0.5 0.4 0.4  PROT  --   --  9.4* 9.1* 8.7*  ALBUMIN 2.6* 2.6* 2.7* 2.6* 2.3*   No results for input(s): LIPASE, AMYLASE in the last 168 hours. No results for input(s): AMMONIA in the last 168 hours. Coagulation profile No results for input(s): INR, PROTIME in the last 168 hours.  CBC:  Recent Labs Lab 01/31/17 0359 02/01/17 0652 02/03/17 0552 02/04/17 0349 02/05/17 0545  WBC 2.5* 2.6* 2.7* 3.2* 3.1*  HGB 7.7* 7.7* 7.6* 7.6* 7.1*  HCT 24.6* 24.5* 24.2* 24.3* 22.4*  MCV 88.2 88.4 88.6 88.7 88.9  PLT 165 159 147* 132* 124*   Cardiac Enzymes: No results for input(s): CKTOTAL, CKMB, CKMBINDEX, TROPONINI in the last 168 hours. BNP (last 3 results) No results for input(s): PROBNP in the last 8760 hours. CBG: No results for input(s): GLUCAP in the last 168 hours. D-Dimer: No results for input(s): DDIMER in the last 72 hours. Hgb A1c: No results for input(s): HGBA1C in the last 72 hours. Lipid Profile: No results for input(s): CHOL, HDL, LDLCALC, TRIG, CHOLHDL, LDLDIRECT in the last 72 hours. Thyroid function  studies: No results for input(s): TSH, T4TOTAL, T3FREE, THYROIDAB in the last 72 hours.  Invalid input(s): FREET3 Anemia work up: No results for input(s): VITAMINB12, FOLATE, FERRITIN, TIBC, IRON, RETICCTPCT in the last 72 hours. Sepsis Labs:  Recent Labs Lab 02/01/17 0652 02/03/17 0552 02/04/17 0349 02/05/17 0545  WBC 2.6* 2.7* 3.2* 3.1*   Microbiology Recent Results (from the past 240 hour(s))  CSF culture     Status: None   Collection Time: 01/30/17  3:54 PM  Result Value Ref Range Status   Specimen Description CSF  Final   Special Requests NONE  Final   Gram Stain   Final    WBC PRESENT, PREDOMINANTLY MONONUCLEAR ENCAPSULATED YEAST SEEN CYTOSPIN SMEAR    Culture NO GROWTH 3 DAYS  Final   Report Status 02/03/2017 FINAL  Final  Fungus Culture With Stain     Status: None (Preliminary result)   Collection Time: 01/30/17  3:54 PM  Result Value Ref Range Status   Fungus Stain Final report  Final    Comment: (NOTE) Performed At: [facility] [place], [place] [id number] Lindon Romp MD [id number]    Fungus (Mycology) Culture PENDING  Incomplete   Fungal Source CSF  Final  Fungus Culture Result     Status: None   Collection Time: 01/30/17  3:54 PM  Result Value Ref Range Status   Result 1 Comment  Final    Comment: (NOTE) KOH/Calcofluor preparation:  no fungus observed. Performed At: [facility] [place], [place] [id number] Lindon Romp MD [id number]      Medications:   . amantadine  100 mg Oral BID WC  . azithromycin  1,200 mg Oral Weekly  . feeding supplement (ENSURE ENLIVE)  237 mL Oral BID BM  . feeding supplement (PRO-STAT SUGAR FREE 64)  30 mL Oral QHS  . fluconazole  400 mg Oral Daily  . lidocaine (PF)  5 mL Intradermal Once  . magnesium oxide  400 mg Oral BID  . mouth rinse  15 mL Mouth Rinse BID  . multivitamin with minerals  1 tablet Oral Daily  . senna-docusate  2 tablet Oral BID  .  sodium chloride flush  3 mL Intravenous Q12H  . sulfamethoxazole-trimethoprim  1 tablet Oral Daily  . tamsulosin  0.4 mg Oral Daily   Continuous Infusions: . sodium chloride 50 mL/hr at 02/05/17 0534     LOS: 67 days   Charlynne Cousins  [facility] Pager [phone]  *Please refer to [email or web address], password [email or web address] to get updated schedule on who will round on this patient, as hospitalists switch teams weekly. If 7PM-7AM, please contact night-coverage at [email or web address], password [email or web address] for any overnight needs.  02/05/2017, 7:27 AM

## 2017-02-06 DIAGNOSIS — Z4659 Encounter for fitting and adjustment of other gastrointestinal appliance and device: Secondary | ICD-10-CM

## 2017-02-06 DIAGNOSIS — E871 Hypo-osmolality and hyponatremia: Secondary | ICD-10-CM

## 2017-02-06 DIAGNOSIS — J9601 Acute respiratory failure with hypoxia: Secondary | ICD-10-CM

## 2017-02-06 DIAGNOSIS — G932 Benign intracranial hypertension: Secondary | ICD-10-CM

## 2017-02-06 DIAGNOSIS — Z978 Presence of other specified devices: Secondary | ICD-10-CM

## 2017-02-06 DIAGNOSIS — B457 Disseminated cryptococcosis: Secondary | ICD-10-CM

## 2017-02-06 LAB — RENAL FUNCTION PANEL
Albumin: 2.3 g/dL — ABNORMAL LOW (ref 3.5–5.0)
Anion gap: 5 (ref 5–15)
BUN: 16 mg/dL (ref 6–20)
CALCIUM: 9 mg/dL (ref 8.9–10.3)
CHLORIDE: 107 mmol/L (ref 101–111)
CO2: 21 mmol/L — AB (ref 22–32)
CREATININE: 0.86 mg/dL (ref 0.61–1.24)
GFR calc Af Amer: >60 mL/min (ref >60)
Glucose, Bld: 86 mg/dL (ref 65–99)
Phosphorus: 4.8 mg/dL — ABNORMAL HIGH (ref 2.5–4.6)
Potassium: 3.8 mmol/L (ref 3.5–5.1)
SODIUM: 133 mmol/L — AB (ref 135–145)

## 2017-02-06 LAB — CBC
HCT: 26.3 % — ABNORMAL LOW (ref 39.0–52.0)
HEMOGLOBIN: 8.2 g/dL — AB (ref 13.0–17.0)
MCH: 27.8 pg (ref 26.0–34.0)
MCHC: 31.2 g/dL (ref 30.0–36.0)
MCV: 89.2 fL (ref 78.0–100.0)
PLATELETS: 169 10*3/uL (ref 150–400)
RBC: 2.95 MIL/uL — AB (ref 4.22–5.81)
RDW: 16.6 % — ABNORMAL HIGH (ref 11.5–15.5)
WBC: 3.6 10*3/uL — AB (ref 4.0–10.5)

## 2017-02-06 LAB — MAGNESIUM: MAGNESIUM: 1.5 mg/dL — AB (ref 1.7–2.4)

## 2017-02-06 MED ORDER — PRO-STAT SUGAR FREE PO LIQD: 30.0000 mL | Freq: Every day | ORAL | Status: AC

## 2017-02-06 MED ORDER — MAGNESIUM OXIDE 400 (241.3 MG) MG PO TABS: 400.0000 mg | ORAL_TABLET | Freq: Two times a day (BID) | ORAL | Status: DC

## 2017-02-06 MED ORDER — FLUCONAZOLE 200 MG PO TABS: 400.0000 mg | ORAL_TABLET | Freq: Every day | ORAL | Status: DC

## 2017-02-06 MED ORDER — ADULT MULTIVITAMIN W/MINERALS CH: 1.0000 | ORAL_TABLET | Freq: Every day | ORAL | Status: DC

## 2017-02-06 MED ORDER — THIAMINE HCL 100 MG PO TABS: 100.0000 mg | ORAL_TABLET | Freq: Every day | ORAL | Status: DC

## 2017-02-06 MED ORDER — AMANTADINE HCL 50 MG/5ML PO SYRP: 100.0000 mg | ORAL_SOLUTION | Freq: Two times a day (BID) | ORAL | 0 refills | Status: DC

## 2017-02-06 MED ORDER — TAMSULOSIN HCL 0.4 MG PO CAPS: 0.4000 mg | ORAL_CAPSULE | Freq: Every day | ORAL | Status: AC

## 2017-02-06 MED ORDER — HYDROCODONE-ACETAMINOPHEN 5-325 MG PO TABS: 1.0000 | ORAL_TABLET | Freq: Four times a day (QID) | ORAL | 0 refills | Status: DC | PRN

## 2017-02-06 MED ORDER — SULFAMETHOXAZOLE-TRIMETHOPRIM 800-160 MG PO TABS: 1.0000 | ORAL_TABLET | Freq: Every day | ORAL | Status: DC

## 2017-02-06 MED ORDER — SENNOSIDES-DOCUSATE SODIUM 8.6-50 MG PO TABS: 2.0000 | ORAL_TABLET | Freq: Two times a day (BID) | ORAL | Status: DC

## 2017-02-06 MED ORDER — POLYETHYLENE GLYCOL 3350 17 G PO PACK: 17.0000 g | PACK | Freq: Every day | ORAL | 0 refills | Status: AC | PRN

## 2017-02-06 MED ORDER — AZITHROMYCIN 600 MG PO TABS: 1200.0000 mg | ORAL_TABLET | ORAL | 0 refills | Status: DC

## 2017-02-06 MED ORDER — ENSURE ENLIVE PO LIQD: 237.0000 mL | Freq: Two times a day (BID) | ORAL | Status: AC

## 2017-02-06 NOTE — Progress Notes (Signed)
Patient will DC to: [facility] (Difficult to Place Bed) Anticipated DC date: 02/06/17 Family notified: Mother Transport by: Corey Harold   Per MD patient ready for DC to [facility]. RN, patient, patient's family, and facility notified of DC. Discharge Summary sent to facility. RN given number for report [phone]). DC packet on chart. Ambulance transport requested for patient.   CSW signing off.  Cedric Fishman, [place] Social Worker [phone]

## 2017-02-06 NOTE — Progress Notes (Signed)
Called report to [facility].

## 2017-02-06 NOTE — Progress Notes (Signed)
Patient ID: William Pena, male   DOB: May 22, 1986, 31 y.o.   MRN: [id number]         [facility]  Date of Admission:  12/20/2016   Total days of cryptococcal therapy 47        Day 17 fluconazole         ASSESSMENT: William Pena is making slow progress on therapy for severe cryptococcal meningoencephalitis. His CSF cultures have been negative since 12/30/2016. His last lumbar puncture showed a low normal opening pressure. He has not shown any clinical evidence of increased intracranial pressure since that time. My plan is to complete introduction of fluconazole therapy in about 4 weeks then reduce the dose of fluconazole and consider starting antiretroviral therapy. [name] ready for transfer to a skilled nursing facility.  PLAN: 1. Continue current dose of fluconazole for 4 more weeks then start antiretroviral therapy. 2. I will arrange follow-up in our clinic within 4 weeks 3. 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 amantadine  100 mg Oral BID WC  . azithromycin  1,200 mg Oral Weekly  . feeding supplement (ENSURE ENLIVE)  237 mL Oral BID BM  . feeding supplement (PRO-STAT SUGAR FREE 64)   30 mL Oral QHS  . fluconazole  400 mg Oral Daily  . lidocaine (PF)  5 mL Intradermal Once  . magnesium oxide  400 mg Oral BID  . mouth rinse  15 mL Mouth Rinse BID  . multivitamin with minerals  1 tablet Oral Daily  . senna-docusate  2 tablet Oral BID  . sodium chloride flush  3 mL Intravenous Q12H  . sulfamethoxazole-trimethoprim  1 tablet Oral Daily  . tamsulosin  0.4 mg Oral Daily  . thiamine  100 mg Oral Daily    SUBJECTIVE: William Pena tells me that he is not having any headache.  Review of Systems: Review of Systems  Unable to perform ROS: Mental acuity    Allergies  Allergen Reactions  . Carrot Oil Swelling    "throat swell"   . Augmentin [Amoxicillin-Pot Clavulanate]     "break out"    OBJECTIVE: Vitals:   02/05/17 0616 02/05/17 1526 02/05/17 2213 02/06/17 0554  BP: 102/66 105/67 97/61 104/64  Pulse: 78 77 73 71  Resp:  17 18 17   Temp:  98.9 F (37.2 C) 97.8 F (36.6 C) 98.5 F (36.9 C)  TempSrc:  Oral  Oral  SpO2: 100% 100% 100% 100%  Weight:      Height:       Body mass index is 22.06 kg/m.  Physical Exam  Constitutional:  [name] sitting up in a chair.  Eyes: Conjunctivae are normal.  Neck:  Neck supple.  Cardiovascular: Normal rate and regular rhythm.   No murmur heard. Pulmonary/Chest: Effort normal and breath sounds normal.  Abdominal: Soft. There is no tenderness.  Neurological: [name] alert.  Remains very slow to answer questions.  Skin: No rash noted.    Lab Results Lab Results  Component Value Date   WBC 3.6 (L) 02/06/2017   HGB 8.2 (L) 02/06/2017   HCT 26.3 (L) 02/06/2017   MCV 89.2 02/06/2017   PLT 169 02/06/2017    Lab Results  Component Value Date   CREATININE 0.86 02/06/2017   BUN 16 02/06/2017   NA 133 (L) 02/06/2017   K 3.8 02/06/2017   CL 107 02/06/2017   CO2 21 (L) 02/06/2017    Lab Results  Component Value Date   ALT 26 02/05/2017   AST 32 02/05/2017   ALKPHOS 90 02/05/2017   BILITOT 0.4 02/05/2017      Microbiology: Recent Results (from the past 240 hour(s))  CSF culture     Status: None   Collection Time: 01/30/17  3:54 PM  Result Value Ref Range Status   Specimen Description CSF  Final   Special Requests NONE  Final   Gram Stain   Final    WBC PRESENT, PREDOMINANTLY MONONUCLEAR ENCAPSULATED YEAST SEEN CYTOSPIN SMEAR    Culture NO GROWTH 3 DAYS  Final   Report Status 02/03/2017 FINAL  Final  Fungus Culture With Stain     Status: None (Preliminary result)   Collection Time: 01/30/17  3:54 PM  Result Value Ref Range Status   Fungus Stain Final report  Final    Comment: (NOTE) Performed At: [facility] [place], [place] [id number] Lindon Romp MD CB:[id number]    Fungus (Mycology) Culture PENDING  Incomplete   Fungal Source CSF  Final  Fungus Culture Result     Status: None   Collection Time: 01/30/17  3:54 PM  Result Value Ref Range Status   Result 1 Comment  Final    Comment: (NOTE) KOH/Calcofluor preparation:  no fungus observed. Performed At: [facility] [address] [place], [place] [id number] [place] TS:[id number]     Michel Bickers, MD [facility] [phone] pager   [phone] cell 02/06/2017, 2:19 PM

## 2017-02-06 NOTE — Clinical Social Work Placement (Signed)
   CLINICAL SOCIAL WORK PLACEMENT  NOTE  Date:  02/06/2017  Patient Details  Name: William Pena MRN: [id number] Date of Birth: 1986/01/17  Clinical Social Work is seeking post-discharge placement for this patient at the [place] level of care (*CSW will initial, date and re-position this form in  chart as items are completed):  Yes   Patient/family provided with [place] Work Department's list of facilities offering this level of care within the geographic area requested by the patient (or if unable, by the patient's family).  Yes   Patient/family informed of their freedom to choose among providers that offer the needed level of care, that participate in Medicare, Medicaid or managed care program needed by the patient, have an available bed and are willing to accept the patient.  Yes   Patient/family informed of [place]'s ownership interest in [facility] and [facility], as well as of the fact that they are under no obligation to receive care at these facilities.  PASRR submitted to EDS on 01/15/17     PASRR number received on 01/15/17     Existing PASRR number confirmed on       FL2 transmitted to all facilities in geographic area requested by pt/family on 01/25/17     FL2 transmitted to all facilities within larger geographic area on 01/27/17     Patient informed that his/her managed care company has contracts with or will negotiate with certain facilities, including the following:        Yes   Patient/family informed of bed offers received.  Patient chooses bed at [facility]     Physician recommends and patient chooses bed at      Patient to be transferred to [facility] on 02/06/17.  Patient to be transferred to facility by [facility]     Patient family notified on 02/06/17 of transfer.  Name of family member notified:  Mother, William Pena     PHYSICIAN       Additional Comment:     _______________________________________________ Benard Halsted, [place] 02/06/2017, 4:16 PM

## 2017-02-06 NOTE — Progress Notes (Signed)
Discharge teaching given, including activity, diet, follow-up appoints, and medications for patient's mother to review. IV access was d/c'd. Vitals are stable. Skin is intact except as charted in most recent assessments. Pt to be escorted out by [facility] to be driven to [facility].

## 2017-02-06 NOTE — Progress Notes (Signed)
[facility] is able to accept patient this afternoon. CSW let them know that ID has recommended to hold patient's antiretroviral therapy for 4 weeks.  Percell Locus Shanee Batch LCSWA [phone]

## 2017-02-06 NOTE — Discharge Summary (Addendum)
Physician Discharge Summary  William Pena IRS:[id number] DOB: August 01, 1986 DOA: 12/20/2016  PCP: [name], MD  Admit date: 12/20/2016 Discharge date: 02/06/2017  Admitted From: Home Disposition: SNF  Recommendations for Outpatient Follow-up:  1. Follow up with PCP in 1 week 2. Infectious disease follow-up 3. Urology follow-up for urinary retention 4. Please follow up on the following pending results: fungal culture (7/2)     Discharge Condition: Stable CODE STATUS: Full code Diet recommendation: Regular   Brief/Interim Summary:  William Pena is a 31 y.o. malepast medical history of HIV with last CD4 count of 21 who was started on heart therapy 2 months prior to admission, with a viral load of 50,000, recurrent perirectal abscesses was treated to surgery in January 2018, he has now been incarcerated at [place], he's had multiple different times to drain, on admission had a sudden drain in place at the fistula. In the ED had a fever 103 sepsis workup was performed a CT scan on 12/07/2016 shows no abscesses and antibiotics were recommended, he was noncompliant for unclear reasons. ID was consulted due to headaches, fever and chills and lymphadenopathy of the in culture from perirectal abscesses were sent. History of cryptococcal antigen titer was elevatedwhich raise the possibility of disseminated cryptococcal infection initial LP performed on 12/18/2016, multiple he's performed to decrease intracranial pressure.. Pulmonary was to do a CT scan of the chest that showed bilateral airspace disease with small cavitary lesions in the right upper lung with mediastinal lymphadenopathy.There was a concern of TB versus pulmonary cryptococcal disease, pulmonary was unable to perform bronchoscopy due to hypoxia, the patient later became lethargic and obtunded requiring intubation with mechanical ventilation status post extubationand 01/03/2017. LP was done to decrease intracranial pressure. Now  extubated rectal drains removed. He failed a swallowing evaluation and a feeding tube was placed. ID, critical care neurology and neurosurgery were consulted during hospital stay. Patient was transferred to [facility] on 01/09/2017. Mental status has slowly been improving. Core track discontinued and 01/10/2017,since then the patient has tolerated his diet. He also developed acute kidney injury(with a baseline creatinine of 1) with a creatinine of 2.8 likely due to urinary retention and amphotericin which is now resolved.      Hospital course:  Disseminated cryptococcal infection with encephalitis Patient required multiple lumbar punctures to decrease intracranial pressure. Patient completed a 20 day course of amphotericin and fluctosine. Infectious disease was consulted for recommendations. Antiretrovirals are to be held for 4 weeks after discharge. Fluconazole was discontinued at discharge for 4 weeks before restarting antiretrovirals. CSF cultures are no growth to date.  Acute urinary retention Unknown etiology. Has failed a voiding trial twice. Recommend outpatient urology follow-up. Foley kept in place.   Acute kidney injury Secondary to urinary retention and possibly amphotericin induced injury. Baseline of less than 1 with increase to 2.71 on 6/14 with nadir of 2.84. Creatinine of 0.86 on discharge.  Hyponatremia Stable. Likely secondary to poor nutrition. Osmolality normal.  Severe protein calorie malnutrition Evaluated by dietitian. Protein supplementation  Acute respiratory failure with hypoxia Secondary to cryptococcal pneumonia. Required intubation. Resolved.  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 Baseline at discharge.  HIV AIDS CD4 count of 21 and viral load >1 million. Continued bactrim and azithromycin prophylaxis. ART recommended to begin on 03/04/2017. Outpatient infectious disease follow-up.  Perirectal  abscess Patient underwent I&D. Continue wound care. Outpatient follow-up  Genital herpes Given Valtrex.  Hypomagnesemia Continue mag-ox.  Diarrhea Secondary to medications. Stool cultures negative.  Anemia of chronic disease Hemoglobin was trending down but stable on discharge. No evidence of bleeding.  Facial swelling Resolved. Likely secondary to hypoalbuminemia.  Constipation Secondary to narcotics most likely. Continue bowel regimen  Discharge Diagnoses:  Principal Problem:   Perirectal abscess Active Problems:   Chronic pain   AIDS (acquired immune deficiency syndrome) (HCC)   Hyponatremia   Normocytic anemia   Elevated LFTs   Pulmonary infiltrates   Hypomagnesemia   Encephalopathy acute   SIRS (systemic inflammatory response syndrome) (HCC)   Acute respiratory failure (HCC)   Rectal abscess   Cryptococcal meningoencephalitis (HCC)   Disseminated cryptococcosis (HCC)   Cavitary pneumonia   Diffuse lymphadenopathy   Drug rash   Elevated intracranial pressure   HIV disease (HCC)   Lymphadenopathy of head and neck   HIV (human immunodeficiency virus infection) ([place])   Meningitis   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[place])   Acute blood loss anemia   AKI (acute kidney injury) ([place])   Pressure injury of skin   Protein-calorie malnutrition, severe    Discharge Instructions   Allergies as of 02/06/2017      Reactions   Carrot Oil Swelling   "throat swell"    Augmentin [amoxicillin-pot Clavulanate]    "break out"      Medication List    STOP taking these medications   acetaminophen 325 MG tablet Commonly known as:  TYLENOL   ibuprofen 800 MG tablet Commonly known as:  ADVIL,MOTRIN     TAKE these medications   amantadine 50 MG/5ML solution Commonly known as:  SYMMETREL Take 10 mLs (100 mg total) by mouth 2 (two) times daily with breakfast and lunch. Start taking on:  02/07/2017   azithromycin 600 MG tablet Commonly known as:  ZITHROMAX Take 2 tablets  (1,200 mg total) by mouth once a week. Start taking on:  02/10/2017   docusate sodium 100 MG capsule Commonly known as:  COLACE Take 100 mg by mouth 2 (two) times daily.   feeding supplement (ENSURE ENLIVE) Liqd Take 237 mLs by mouth 2 (two) times daily between meals.   feeding supplement (PRO-STAT SUGAR FREE 64) Liqd Take 30 mLs by mouth at bedtime.   fluconazole 200 MG tablet Commonly known as:  DIFLUCAN Take 2 tablets (400 mg total) by mouth daily. Start taking on:  02/07/2017   HYDROcodone-acetaminophen 5-325 MG tablet Commonly known as:  NORCO/VICODIN Take 1 tablet by mouth every 6 (six) hours as needed for moderate pain.   magnesium oxide 400 (241.3 Mg) MG tablet Commonly known as:  MAG-OX Take 1 tablet (400 mg total) by mouth 2 (two) times daily.   multivitamin with minerals Tabs tablet Take 1 tablet by mouth daily. Start taking on:  02/07/2017   ondansetron 4 MG disintegrating tablet Commonly known as:  ZOFRAN ODT Take 1 tablet (4 mg total) by mouth every 8 (eight) hours as needed for nausea or vomiting.   polyethylene glycol packet Commonly known as:  MIRALAX / GLYCOLAX Take 17 g by mouth daily as needed for mild constipation.   senna-docusate 8.6-50 MG tablet Commonly known as:  Senokot-S Take 2 tablets by mouth 2 (two) times daily.   sulfamethoxazole-trimethoprim 800-160 MG tablet Commonly known as:  BACTRIM DS,SEPTRA DS Take 1 tablet by mouth daily. Start taking on:  02/07/2017   tamsulosin 0.4 MG Caps capsule Commonly known as:  FLOMAX Take 1 capsule (0.4 mg total) by mouth daily. Start taking on:  02/07/2017   thiamine 100 MG tablet Take 1 tablet (100  mg total) by mouth daily. Start taking on:  02/07/2017      Contact information for after-discharge care    Destination    [facility] .   Specialty:  [place] information: [place] [phone]              Allergies  Allergen Reactions  . Carrot Oil Swelling    "throat swell"   . Augmentin [Amoxicillin-Pot Clavulanate]     "break out"    Consultations:  Infectious disease  Critical care medicine   Procedures/Studies: Dg Chest Port 1 View  Result Date: 01/08/2017 CLINICAL DATA:  HIV disease with meningitis. EXAM: PORTABLE CHEST 1 VIEW COMPARISON:  January 07, 2017 FINDINGS: Feeding tube tip is below the diaphragm. Contrast is seen in the stomach. There is no edema or consolidation. Heart is slightly enlarged with pulmonary vascularity within normal limits. No adenopathy. No bone lesions. IMPRESSION: Stable cardiac prominence. No edema or consolidation. Feeding tube tip below diaphragm. Electronically Signed   By: Lowella Grip III M.D.   On: 01/08/2017 07:26   Dg Fluoro Guide Lumbar Puncture  Result Date: 01/30/2017 CLINICAL DATA:  HIV. EXAM: DIAGNOSTIC LUMBAR PUNCTURE UNDER FLUOROSCOPIC GUIDANCE FLUOROSCOPY TIME:  Fluoroscopy Time:  1 minutes and 12 seconds Radiation Exposure Index (if provided by the fluoroscopic device): Not applicable. Number of Acquired Spot Images: 1 PROCEDURE: Informed consent was obtained from the patient's mother prior to the procedure, including potential complications of headache, allergy, and pain. With the patient prone, the lower back was prepped with Betadine. 1% Lidocaine was used for local anesthesia. Lumbar puncture was performed at the L2-3 level using a 20 gauge needle with return of clear CSF with an opening pressure of 9 cm water. 3.5 ml of CSF were obtained for laboratory studies. The patient tolerated the procedure well and there were no apparent complications. Further fluid could not be obtained secondary to patient's clinical status and low pressure. Closing pressure was not performed secondary to low pressure throughout. IMPRESSION: Non complicated lumbar puncture as detailed above. Electronically Signed   By: Abigail Miyamoto M.D.   On: 01/30/2017 16:26    Dg Fluoro Guide Lumbar Puncture  Result Date: 01/25/2017 Etheleen Mayhew, MD     01/25/2017  1:46 PM Lumbar puncture performed at L2-L3.  Opening pressure 22 cm H20.  22 mL clear CSF obtained and sent to lab for testing.  Please see full dictation in PACS for additional details.       Subjective: Non-verbal.  Discharge Exam: Vitals:   02/05/17 2213 02/06/17 0554  BP: 97/61 104/64  Pulse: 73 71  Resp: 18 17  Temp: 97.8 F (36.6 C) 98.5 F (36.9 C)   Vitals:   02/05/17 0616 02/05/17 1526 02/05/17 2213 02/06/17 0554  BP: 102/66 105/67 97/61 104/64  Pulse: 78 77 73 71  Resp:  17 18 17   Temp:  98.9 F (37.2 C) 97.8 F (36.6 C) 98.5 F (36.9 C)  TempSrc:  Oral  Oral  SpO2: 100% 100% 100% 100%  Weight:      Height:        General: Pt is alert, awake, not in acute distress Cardiovascular: RRR, S1/S2 +, no rubs, no gallops Respiratory: CTA bilaterally, no wheezing, no rhonchi Abdominal: Soft, NT, ND, bowel sounds + Extremities: no edema, no cyanosis    The results of significant diagnostics from this hospitalization (including imaging, microbiology, ancillary and laboratory) are listed  below for reference.     Microbiology: Recent Results (from the past 240 hour(s))  CSF culture     Status: None   Collection Time: 01/30/17  3:54 PM  Result Value Ref Range Status   Specimen Description CSF  Final   Special Requests NONE  Final   Gram Stain   Final    WBC PRESENT, PREDOMINANTLY MONONUCLEAR ENCAPSULATED YEAST SEEN CYTOSPIN SMEAR    Culture NO GROWTH 3 DAYS  Final   Report Status 02/03/2017 FINAL  Final  Fungus Culture With Stain     Status: None (Preliminary result)   Collection Time: 01/30/17  3:54 PM  Result Value Ref Range Status   Fungus Stain Final report  Final    Comment: (NOTE) Performed At: [facility] [place], [place] [id number] Lindon Romp MD ON:[id number]    Fungus (Mycology) Culture PENDING  Incomplete   Fungal  Source CSF  Final  Fungus Culture Result     Status: None   Collection Time: 01/30/17  3:54 PM  Result Value Ref Range Status   Result 1 Comment  Final    Comment: (NOTE) KOH/Calcofluor preparation:  no fungus observed. Performed At: [facility] [place], [place] [id number] Lindon Romp MD DG:[id number]      Labs: BNP (last 3 results) No results for input(s): BNP in the last 8760 hours. Basic Metabolic Panel:  Recent Labs Lab 02/02/17 0735 02/03/17 0552 02/04/17 0349 02/05/17 0545 02/06/17 0507  NA 134* 132* 131* 130* 133*  K 4.0 4.4 4.2 4.1 3.8  CL 106 104 104 105 107  CO2 22 21* 20* 20* 21*  GLUCOSE 86 91 91 81 86  BUN 18 23* 21* 17 16  CREATININE 1.02 1.03 0.95 0.90 0.86  CALCIUM 9.1 9.2 9.4 8.7* 9.0  MG 1.5* 1.6* 1.7 1.5* 1.5*  PHOS 4.9* 5.0* 5.2* 5.2* 4.8*   Liver Function Tests:  Recent Labs Lab 02/02/17 0735 02/03/17 0552 02/04/17 0349 02/05/17 0545 02/06/17 0507  AST  --  31 26 32  --   ALT  --  27 23 26   --   ALKPHOS  --  84 96 90  --   BILITOT  --  0.5 0.4 0.4  --   PROT  --  9.4* 9.1* 8.7*  --   ALBUMIN 2.6* 2.7* 2.6* 2.3* 2.3*   No results for input(s): LIPASE, AMYLASE in the last 168 hours. No results for input(s): AMMONIA in the last 168 hours. CBC:  Recent Labs Lab 02/01/17 0652 02/03/17 0552 02/04/17 0349 02/05/17 0545 02/06/17 1338  WBC 2.6* 2.7* 3.2* 3.1* 3.6*  HGB 7.7* 7.6* 7.6* 7.1* 8.2*  HCT 24.5* 24.2* 24.3* 22.4* 26.3*  MCV 88.4 88.6 88.7 88.9 89.2  PLT 159 147* 132* 124* 169   Cardiac Enzymes: No results for input(s): CKTOTAL, CKMB, CKMBINDEX, TROPONINI in the last 168 hours. BNP: Invalid input(s): POCBNP CBG: No results for input(s): GLUCAP in the last 168 hours. D-Dimer No results for input(s): DDIMER in the last 72 hours. Hgb A1c No results for input(s): HGBA1C in the last 72 hours. Lipid Profile No results for input(s): CHOL, HDL, LDLCALC, TRIG, CHOLHDL, LDLDIRECT in the last 72  hours. Thyroid function studies No results for input(s): TSH, T4TOTAL, T3FREE, THYROIDAB in the last 72 hours.  Invalid input(s): FREET3 Anemia work up  Recent Labs  02/05/17 0937  VITAMINB12 1,083*   Urinalysis    Component Value Date/Time   COLORURINE YELLOW 12/20/2016  [place] 12/20/2016 0706   LABSPEC 1.026 12/20/2016 0706   PHURINE 6.0 12/20/2016 0706   GLUCOSEU NEGATIVE 12/20/2016 0706   HGBUR NEGATIVE 12/20/2016 0706   BILIRUBINUR NEGATIVE 12/20/2016 0706   KETONESUR NEGATIVE 12/20/2016 0706   PROTEINUR 100 (A) 12/20/2016 0706   NITRITE NEGATIVE 12/20/2016 0706   LEUKOCYTESUR NEGATIVE 12/20/2016 0706   Sepsis Labs Invalid input(s): PROCALCITONIN,  WBC,  LACTICIDVEN Microbiology Recent Results (from the past 240 hour(s))  CSF culture     Status: None   Collection Time: 01/30/17  3:54 PM  Result Value Ref Range Status   Specimen Description CSF  Final   Special Requests NONE  Final   Gram Stain   Final    WBC PRESENT, PREDOMINANTLY MONONUCLEAR ENCAPSULATED YEAST SEEN CYTOSPIN SMEAR    Culture NO GROWTH 3 DAYS  Final   Report Status 02/03/2017 FINAL  Final  Fungus Culture With Stain     Status: None (Preliminary result)   Collection Time: 01/30/17  3:54 PM  Result Value Ref Range Status   Fungus Stain Final report  Final    Comment: (NOTE) Performed At: [facility] [place], [place] [id number] Lindon Romp MD AV:[id number]    Fungus (Mycology) Culture PENDING  Incomplete   Fungal Source CSF  Final  Fungus Culture Result     Status: None   Collection Time: 01/30/17  3:54 PM  Result Value Ref Range Status   Result 1 Comment  Final    Comment: (NOTE) KOH/Calcofluor preparation:  no fungus observed. Performed At: [facility] [place], [place] [id number] Lindon Romp MD EM:[id number]      Time coordinating discharge: Over 30 minutes  SIGNED:   Cordelia Poche, MD [facility] 02/06/2017, 3:23 PM Pager [phone]  If 7PM-7AM, please contact night-coverage [email or web address] Password [email or web address]

## 2017-02-06 NOTE — Progress Notes (Signed)
Physical Therapy Treatment Patient Details Name: William Pena MRN: [id number] DOB: 21-Jan-1986 Today's Date: 02/06/2017    History of Present Illness Patient is a 31 y/o male presents with sepsis secondary to perirectal abscess s/p I&D 5/23 and disseminated cryptococcal infection including meningitis, bacteremia, and likely pneumonia. Intubated 5/28-6/5. Pt found to have acute ischemic strokes. 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 Second MRI- extension of the diffusion restriction in the left basal ganglia and right basal ganglia. s/p Lumbar drain 5/30. PMH includes advanced hiv disease    PT Comments    Pt with improved participation and initiation of movement. Pt able to complete stand pvt with mod/maxAx2. Pt remains non-verbal and con't to require max v/c's to complete task as well as tactile cues. Acute PT to con't to follow.   Follow Up Recommendations  SNF     Equipment Recommendations       Recommendations for Other Services Rehab consult     Precautions / Restrictions Precautions Precautions: Fall Restrictions Weight Bearing Restrictions: No    Mobility  Bed Mobility Overal bed mobility: Needs Assistance Bed Mobility: Rolling;Sidelying to Sit Rolling: Max assist Sidelying to sit: Max assist;+2 for physical assistance       General bed mobility comments: max verbal and tactile cues, pt did initiate with L UE and L LE to roll and push up with R elbow into sitting, maxA to copmlete task  Transfers Overall transfer level: Needs assistance Equipment used:  (2 person lift with gait belt and bed pad) Transfers: Sit to/from Stand Sit to Stand: Mod assist;Max assist;+2 physical assistance   Squat pivot transfers: Mod assist;Max assist;+2 physical assistance     General transfer comment: pt initiated power up, tactile cues to weight-shift and advance LEs  Ambulation/Gait             General Gait  Details: not able yet   Stairs            Wheelchair Mobility    Modified Rankin (Stroke Patients Only) Modified Rankin (Stroke Patients Only) Pre-Morbid Rankin Score: No symptoms Modified Rankin: Severe disability     Balance Overall balance assessment: Needs assistance Sitting-balance support: Feet supported Sitting balance-Leahy Scale: Poor Sitting balance - Comments: pt required max verbal cues and modA to maintain midline and upright position Postural control: Posterior lean;Right lateral lean Standing balance support: Bilateral upper extremity supported Standing balance-Leahy Scale: Zero Standing balance comment: dependent on physical assist                            Cognition Arousal/Alertness: Awake/alert Behavior During Therapy: Flat affect Overall Cognitive Status: Impaired/Different from baseline Area of Impairment: Following commands;Problem solving                 Orientation Level:  (unable to verbally communicate or do head nods to verify ) Current Attention Level: Selective Memory: Decreased short-term memory Following Commands: Follows one step commands with increased time;Follows one step commands inconsistently Safety/Judgement: Decreased awareness of deficits Awareness: Intellectual Problem Solving: Slow processing;Decreased initiation;Difficulty sequencing;Requires verbal cues;Requires tactile cues General Comments: non verbal communication      Exercises      General Comments        Pertinent Vitals/Pain Pain Assessment:  (didn't report or demonstrate signs of pain)    Home Living  Prior Function            PT Goals (current goals can now be found in the care plan section) Acute Rehab PT Goals Patient Stated Goal: didn't state Progress towards PT goals: Progressing toward goals    Frequency    Min 3X/week      PT Plan Current plan remains appropriate    Co-evaluation               AM-PAC PT "6 Clicks" Daily Activity  Outcome Measure  Difficulty turning over in bed (including adjusting bedclothes, sheets and blankets)?: Total Difficulty moving from lying on back to sitting on the side of the bed? : Total Difficulty sitting down on and standing up from a chair with arms (e.g., wheelchair, bedside commode, etc,.)?: Total Help needed moving to and from a bed to chair (including a wheelchair)?: Total Help needed walking in hospital room?: Total Help needed climbing 3-5 steps with a railing? : Total 6 Click Score: 6    End of Session         PT Visit Diagnosis: Unsteadiness on feet (R26.81);Other abnormalities of gait and mobility (R26.89);Hemiplegia and hemiparesis Hemiplegia - Right/Left: Left Hemiplegia - dominant/non-dominant: Non-dominant Hemiplegia - caused by: Cerebral infarction     Time: 5852-7782 PT Time Calculation (min) (ACUTE ONLY): 16 min  Charges:  $Therapeutic Activity: 8-22 mins                    G Codes:      Kittie Plater, PT, DPT Pager #: [phone] Office #: [phone]   [place] 02/06/2017, 2:40 PM

## 2017-02-07 ENCOUNTER — Encounter: Payer: Self-pay | Admitting: Internal Medicine

## 2017-02-07 ENCOUNTER — Non-Acute Institutional Stay (SKILLED_NURSING_FACILITY): Payer: Medicaid Other | Admitting: Internal Medicine

## 2017-02-07 DIAGNOSIS — B20 Human immunodeficiency virus [HIV] disease: Secondary | ICD-10-CM | POA: Diagnosis not present

## 2017-02-07 DIAGNOSIS — R918 Other nonspecific abnormal finding of lung field: Secondary | ICD-10-CM | POA: Diagnosis not present

## 2017-02-07 DIAGNOSIS — G9340 Encephalopathy, unspecified: Secondary | ICD-10-CM

## 2017-02-07 DIAGNOSIS — G932 Benign intracranial hypertension: Secondary | ICD-10-CM

## 2017-02-07 DIAGNOSIS — N179 Acute kidney failure, unspecified: Secondary | ICD-10-CM

## 2017-02-07 DIAGNOSIS — J9601 Acute respiratory failure with hypoxia: Secondary | ICD-10-CM

## 2017-02-07 DIAGNOSIS — A6002 Herpesviral infection of other male genital organs: Secondary | ICD-10-CM

## 2017-02-07 DIAGNOSIS — Z21 Asymptomatic human immunodeficiency virus [HIV] infection status: Secondary | ICD-10-CM

## 2017-02-07 DIAGNOSIS — K5903 Drug induced constipation: Secondary | ICD-10-CM

## 2017-02-07 DIAGNOSIS — B457 Disseminated cryptococcosis: Secondary | ICD-10-CM

## 2017-02-07 DIAGNOSIS — E871 Hypo-osmolality and hyponatremia: Secondary | ICD-10-CM | POA: Diagnosis not present

## 2017-02-07 DIAGNOSIS — B451 Cerebral cryptococcosis: Secondary | ICD-10-CM | POA: Diagnosis not present

## 2017-02-07 DIAGNOSIS — J189 Pneumonia, unspecified organism: Secondary | ICD-10-CM

## 2017-02-07 DIAGNOSIS — K611 Rectal abscess: Secondary | ICD-10-CM

## 2017-02-07 DIAGNOSIS — R338 Other retention of urine: Secondary | ICD-10-CM | POA: Diagnosis not present

## 2017-02-07 DIAGNOSIS — E43 Unspecified severe protein-calorie malnutrition: Secondary | ICD-10-CM

## 2017-02-07 DIAGNOSIS — J984 Other disorders of lung: Secondary | ICD-10-CM

## 2017-02-07 DIAGNOSIS — D638 Anemia in other chronic diseases classified elsewhere: Secondary | ICD-10-CM

## 2017-02-07 LAB — FOLATE RBC
FOLATE, HEMOLYSATE: 250.8 ng/mL
Folate, RBC: 1161 ng/mL (ref >498)
HEMATOCRIT: 21.6 % — AB (ref 37.5–51.0)

## 2017-02-07 NOTE — Progress Notes (Signed)
: Provider:  Noah Delaine. Sheppard Coil, MD Location:  [place] Room Number: 841 Place of Service:  SNF ([phone])  PCP: [name], [name], MD Patient Care Team: [name], [name], MD as PCP - General (Infectious Diseases)  Extended Emergency Contact Information Primary Emergency Contact: [place] Phone: [phone] Mobile Phone: [phone] Relation: Mother Secondary Emergency Contact: Lissa Merlin [place] of [place] Mobile Phone: [phone] Relation: Grandmother Father: Kieth, Hartis [place] of [place] Mobile Phone: [phone]     Allergies: Carrot oil and Augmentin [amoxicillin-pot clavulanate]  Chief Complaint  Patient presents with  . New Admit To SNF    following hospitalization 12/20/16 to 02/06/17 Disseminated cryptococoal infection with encephaliatis    HPI: Patient is 31 y.o. male withHIV with last CD4 count of 21, viral load of 50,002, followed at [facility], history of recurrent perirectal abscesses having had multiple types of drains but currently just has a draining seton in place at site of the fistula, currently incarcerated in [facility], has been seen in the ED 3 times this week and sent back with antibiotics which apparently patient did not take. Patient with history of continued fevers and rectal pain. Nothing aggravates the symptoms. The symptoms are relieved by Tylenol. Treatment was tried; multiple rounds of antibiotics and currently last prescribed with clindamycin and Flagyl. Patient is reported to have a fever to 103 and had a sepsis workup the a.m. of 522 at [facility] before he was sent out. He did have a CT on 5/19 showed small abscesses and antibiotics were recommended. He was not able to be compliant with that for unclear reasons. He's also had abdominal pain. Patient was admitted to [facility] from 5/22-7/9 for disseminated cryptococcal infection with encephalitis, which  diagnosis was obtained after a lengthy workup. Hospital course was complicated by acute respiratory failure requiring intubation and multiple lumbar punctures done to decrease intracranial pressure. Rectal drains were removed but patient failed his swallowing a value evaluation and a feeding tube was placed. Before discharge patient was able to begin tolerating a by mouth diet. He also developed acute kidney injury with a baseline creatinine of 1 which was likely due to urinary retention and amphotericin and this is now resolved. Patient is admitted to skilled nursing facility with generalized weakness for OT/PT. While at skilled nursing facility patient will be followed for HIV treated with Bactrim DS and Zithromax as prophylaxis, perirectal abscess treated with wound care and constipation treated with Colace.  Past Medical History:  Diagnosis Date  . Acute respiratory failure ([place]) 12/23/2016  . AIDS (acquired immune deficiency syndrome) ([place]) 07/07/2016  . AKI (acute kidney injury) ([place])   . Cavitary pneumonia   . 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 Diffuse lymphadenopathy   . 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 Drug rash   . Elevated intracranial pressure   . Elevated LFTs 12/21/2016  . 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 Encephalopathy acute 12/23/2016  . HIV (human immunodeficiency virus infection) ([place])   . HIV disease ([place])   . Hypomagnesemia 12/23/2016  . Hyponatremia 12/21/2016  . Lymphadenopathy of head and neck   . Meningitis   . Normocytic anemia 12/21/2016  . Perirectal abscess   . Protein-calorie malnutrition, severe 01/28/2017  . Pulmonary infiltrates   . SIRS (systemic inflammatory response syndrome) ([place]) 12/23/2016    Past Surgical History:  Procedure Laterality Date  . FLEXIBLE SIGMOIDOSCOPY  04/04/2008  . INCISION AND DRAINAGE PERIRECTAL ABSCESS    .  INCISION AND DRAINAGE PERIRECTAL ABSCESS N/A 12/21/2016   Procedure: IRRIGATION AND DEBRIDEMENT PERIRECTAL ABSCESS;   Surgeon: Ralene Ok, MD;  Location: [place];  Service: General;  Laterality: N/A;  . PLACEMENT OF LUMBAR DRAIN N/A 12/27/2016   Procedure: PLACEMENT OF LUMBAR DRAIN;  Surgeon: Consuella Lose, MD;  Location: [place];  Service: Neurosurgery;  Laterality: N/A;    Allergies as of 02/07/2017      Reactions   Carrot Oil Swelling   "throat swell"    Augmentin [amoxicillin-pot Clavulanate]    "break out"      Medication List       Accurate as of 02/07/17  9:05 AM. Always use your most recent med list.          amantadine 50 MG/5ML solution Commonly known as:  SYMMETREL Take 10 mLs (100 mg total) by mouth 2 (two) times daily with breakfast and lunch.   azithromycin 600 MG tablet Commonly known as:  ZITHROMAX Take 2 tablets (1,200 mg total) by mouth once a week. Start taking on:  02/10/2017   docusate sodium 100 MG capsule Commonly known as:  COLACE Take 100 mg by mouth 2 (two) times daily.   feeding supplement (ENSURE ENLIVE) Liqd Take 237 mLs by mouth 2 (two) times daily between meals.   feeding supplement (PRO-STAT SUGAR FREE 64) Liqd Take 30 mLs by mouth at bedtime.   fluconazole 200 MG tablet Commonly known as:  DIFLUCAN Take 2 tablets (400 mg total) by mouth daily.   HYDROcodone-acetaminophen 5-325 MG tablet Commonly known as:  NORCO/VICODIN Take 1 tablet by mouth every 6 (six) hours as needed for moderate pain.   magnesium oxide 400 (241.3 Mg) MG tablet Commonly known as:  MAG-OX Take 1 tablet (400 mg total) by mouth 2 (two) times daily.   multivitamin with minerals Tabs tablet Take 1 tablet by mouth daily.   ondansetron 4 MG disintegrating tablet Commonly known as:  ZOFRAN ODT Take 1 tablet (4 mg total) by mouth every 8 (eight) hours as needed for nausea or vomiting.   polyethylene glycol packet Commonly known as:  MIRALAX / GLYCOLAX Take 17 g by mouth daily as needed for mild constipation.   senna-docusate 8.6-50 MG tablet Commonly known as:   Senokot-S Take 2 tablets by mouth 2 (two) times daily.   sulfamethoxazole-trimethoprim 800-160 MG tablet Commonly known as:  BACTRIM DS,SEPTRA DS Take 1 tablet by mouth daily.   tamsulosin 0.4 MG Caps capsule Commonly known as:  FLOMAX Take 1 capsule (0.4 mg total) by mouth daily.   thiamine 100 MG tablet Take 1 tablet (100 mg total) by mouth daily.       No orders of the defined types were placed in this encounter.   Immunization History  Administered Date(s) Administered  . Influenza-Unspecified 05/01/2016    Social History  Substance Use Topics  . Smoking status: Former Smoker    Packs/day: 0.33    Types: Cigarettes  . Smokeless tobacco: Never Used  . Alcohol use No    Family history is   Family History  Problem Relation Age of Onset  . Diabetes Other       Review of Systems  UTO 2/2 pt would not speak to me;per nursing no concerns    Vitals:   02/07/17 0848  BP: 104/64  Pulse: 78  Resp: 18  Temp: 97.9 F (36.6 C)    SpO2 Readings from Last 1 Encounters:  02/07/17 96%   Body mass index is 21.95 kg/m.  Physical Exam  GENERAL APPEARANCE: Eyes closed,  No acute distress.  SKIN: No diaphoresis rash HEAD: Normocephalic, atraumatic  EYES: Conjunctiva/lids clear. Pupils round, reactive. EOMs intact.  EARS: External exam WNL, canals clear. Hearing grossly normal.  NOSE: No deformity or discharge.  MOUTH/THROAT: Lips w/o lesions  RESPIRATORY: Breathing is even, unlabored. Lung sounds are clear   CARDIOVASCULAR: Heart RRR no murmurs, rubs or gallops. No peripheral edema.   GASTROINTESTINAL: Abdomen is soft, non-tender, not distended w/ normal bowel sounds. GENITOURINARY: Bladder non tender, not distended  MUSCULOSKELETAL: Wasting NEUROLOGIC:  Cranial nerves 2-12 grossly intact. Moves all extremities  PSYCHIATRIC: Patient would not speak to being  Patient Active Problem List   Diagnosis Date Noted  . Protein-calorie malnutrition, severe  01/28/2017  . Pressure injury of skin 01/16/2017  . AKI (acute kidney injury) ([place])   . HIV (human immunodeficiency virus infection) ([place])   . Meningitis   . 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 Acute blood loss anemia   . Elevated intracranial pressure   . HIV disease ([place])   . Lymphadenopathy of head and neck   . Rectal abscess   . 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 Cavitary pneumonia   . Diffuse lymphadenopathy   . Drug rash   . Hypomagnesemia 12/23/2016  . Encephalopathy acute 12/23/2016  . SIRS (systemic inflammatory response syndrome) ([place]) 12/23/2016  . Acute respiratory failure ([place]) 12/23/2016  . Pulmonary infiltrates   . Perirectal abscess 12/21/2016  . Hyponatremia 12/21/2016  . Normocytic anemia 12/21/2016  . Elevated LFTs 12/21/2016  . AIDS (acquired immune deficiency syndrome) ([place]) 07/07/2016  . Chronic pain 06/20/2011      Labs reviewed: Basic Metabolic Panel:    Component Value Date/Time   NA 133 (L) 02/06/2017 0507   K 3.8 02/06/2017 0507   CL 107 02/06/2017 0507   CO2 21 (L) 02/06/2017 0507   GLUCOSE 86 02/06/2017 0507   BUN 16 02/06/2017 0507   CREATININE 0.86 02/06/2017 0507   CALCIUM 9.0 02/06/2017 0507   PROT 8.7 (H) 02/05/2017 0545   ALBUMIN 2.3 (L) 02/06/2017 0507   AST 32 02/05/2017 0545   ALT 26 02/05/2017 0545   ALKPHOS 90 02/05/2017 0545   BILITOT 0.4 02/05/2017 0545   GFRNONAA >60 02/06/2017 0507   GFRAA >60 02/06/2017 0507     Recent Labs  02/04/17 0349 02/05/17 0545 02/06/17 0507  NA 131* 130* 133*  K 4.2 4.1 3.8  CL 104 105 107  CO2 20* 20* 21*  GLUCOSE 91 81 86  BUN 21* 17 16  CREATININE 0.95 0.90 0.86  CALCIUM 9.4 8.7* 9.0  MG 1.7 1.5* 1.5*  PHOS 5.2* 5.2* 4.8*   Liver Function Tests:  Recent Labs  02/03/17 0552 02/04/17 0349 02/05/17 0545 02/06/17 0507  AST 31 26 32  --   ALT _0 --   ALKPHOS 84 96 90  --   BILITOT 0.5 0.4 0.4  --   PROT 9.4* 9.1* 8.7*  --    ALBUMIN 2.7* 2.6* 2.3* 2.3*   No results for input(s): LIPASE, AMYLASE in the last 8760 hours. No results for input(s): AMMONIA in the last 8760 hours. CBC:  Recent Labs  01/08/17 0206 01/09/17 0328  01/16/17 0640  02/04/17 0349 02/05/17 0545 02/06/17 1338  WBC 7.6 4.5  < > 4.9  < > 3.2* 3.1* 3.6*  NEUTROABS 6.3 3.2  --  3.8  --   --   --   --  HGB 8.8* 9.0*  < > 7.0*  < > 7.6* 7.1* 8.2*  HCT 27.1* 27.3*  < > 22.2*  < > 24.3* 22.4* 26.3*  MCV 84.2 83.7  < > 84.4  < > 88.7 88.9 89.2  PLT 209 179  < > 105*  < > 132* 124* 169  < > = values in this interval not displayed. Lipid  Recent Labs  12/26/16 1902  TRIG 144    Cardiac Enzymes: No results for input(s): CKTOTAL, CKMB, CKMBINDEX, TROPONINI in the last 8760 hours. BNP: No results for input(s): BNP in the last 8760 hours. No results found for: MICROALBUR No results found for: HGBA1C No results found for: TSH Lab Results  Component Value Date   VITAMINB12 1,083 (H) 02/05/2017   No results found for: FOLATE No results found for: IRON, TIBC, FERRITIN  Imaging and Procedures obtained prior to SNF admission: Dg Chest 2 View  Result Date: 12/20/2016 CLINICAL DATA:  Abdominal pain with nausea and vomiting and shortness of breath EXAM: CHEST  2 VIEW COMPARISON:  None. FINDINGS: The heart size and mediastinal contours are within normal limits. Both lungs are clear. The visualized skeletal structures are unremarkable. IMPRESSION: No active cardiopulmonary disease. Electronically Signed   By: Inez Catalina M.D.   On: 12/20/2016 07:48   Ct Head Wo Contrast  Result Date: 12/21/2016 CLINICAL DATA:  HIV. Concern for disseminated infection or meningitis. Lymphadenopathy. Nuchal rigidity. EXAM: CT HEAD WITHOUT CONTRAST TECHNIQUE: Contiguous axial images were obtained from the base of the skull through the vertex without intravenous contrast. COMPARISON:  Head CT 12/17/2016 FINDINGS: Brain: No mass lesion, intraparenchymal hemorrhage  or extra-axial collection. No evidence of acute cortical infarct. Brain parenchyma and CSF-containing spaces are normal for age. Vascular: No hyperdense vessel or unexpected calcification. Skull: Normal visualized skull base, calvarium and extracranial soft tissues. Sinuses/Orbits: No sinus fluid levels or advanced mucosal thickening. No mastoid effusion. Normal orbits. IMPRESSION: Normal head CT. Electronically Signed   By: Ulyses Jarred M.D.   On: 12/21/2016 19:06   Ct Chest Wo Contrast  Result Date: 12/21/2016 CLINICAL DATA:  Evaluate for lymph node enlargement. 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 31 year old with HIV. EXAM: CT CHEST WITHOUT CONTRAST TECHNIQUE: Multidetector CT imaging of the chest was performed following the standard protocol without IV contrast. COMPARISON:  Chest radiograph 12/20/2016 FINDINGS: Cardiovascular: Normal caliber of the thoracic aorta. No gross abnormality to the pulmonary arteries. Limited evaluation of the vascular structures without intravenous contrast. Mediastinum/Nodes: Limited evaluation for mediastinal and hilar lymphadenopathy on this noncontrast examination. There appears to be subcarinal fullness measuring up to 1.8 cm which probably represents enlarged lymph nodes. Soft tissue fullness in the right hilum on sequence 4, image 75 measures 1.6 cm in the short axis. Multiple small lymph nodes scattered throughout the bilateral axillary regions. There appears to be multiple lymph nodes in the paratracheal region. There is low density in the anterior mediastinum which is nonspecific but could be associated with thymus. Cannot exclude right supraclavicular lymphadenopathy. Lungs/Pleura: There is trace right pleural fluid versus right pleural thickening. Trachea and mainstem bronchi are patent. Patchy areas of airspace disease in both lower lobes and both upper lobes. Small cavitary lesion in the right upper lobe measuring 6 mm on sequence 6, image 47. Scattered areas of  interstitial thickening in the right lung. Upper Abdomen: Images of the upper abdomen are unremarkable. Musculoskeletal: No acute bone abnormality. IMPRESSION: Bilateral airspace disease with a small cavitary lesion in the right upper lobe. Findings  compatible with pneumonia. Trace right pleural fluid versus right pleural thickening. Mediastinal and hilar lymphadenopathy. Evaluation of the lymphadenopathy is limited on this noncontrast examination. There are also prominent bilateral axillary lymph nodes. Electronically Signed   By: Markus Daft M.D.   On: 12/21/2016 21:15   Ct Abdomen Pelvis W Contrast  Result Date: 12/21/2016 CLINICAL DATA:  Known rectal abscesses, status post incision and drainage. Personal history of HIV. Initial encounter. EXAM: CT ABDOMEN AND PELVIS WITH CONTRAST TECHNIQUE: Multidetector CT imaging of the abdomen and pelvis was performed using the standard protocol following bolus administration of intravenous contrast. CONTRAST:  165m ISOVUE-300 IOPAMIDOL (ISOVUE-300) INJECTION 61% COMPARISON:  CT of the abdomen and pelvis performed 12/17/2016 FINDINGS: Lower chest: Mild right basilar airspace opacity raises concern for pneumonia. A 1.6 cm azygoesophageal recess node is seen. Hepatobiliary: The liver is unremarkable in appearance. A stone is noted dependently within the gallbladder. The gallbladder is otherwise unremarkable. The common bile duct remains normal in caliber. Pancreas: The pancreas is within normal limits. Spleen: The spleen is unremarkable in appearance. Adrenals/Urinary Tract: The adrenal glands are unremarkable in appearance. The kidneys are within normal limits. There is no evidence of hydronephrosis. No renal or ureteral stones are identified. No perinephric stranding is seen. Stomach/Bowel: The stomach is unremarkable in appearance. The small bowel is within normal limits. The patient is status post appendectomy. Mild wall thickening is noted along the distal sigmoid  colon and rectum, raising concern for proctitis. The remainder of the colon is filled with contrast and is unremarkable in appearance. Vascular/Lymphatic: The abdominal aorta is unremarkable in appearance. The inferior vena cava is grossly unremarkable. Prominent retroperitoneal nodes are seen, measuring up to 1.1 cm in short axis. Prominent nodes are also noted at both sides of the pelvis, measuring up to 1.7 cm in short axis. Enlarged bilateral inguinal nodes are seen, measuring up to 1.6 cm in short axis. Reproductive: The bladder is mildly distended and contains trace contrast. The prostate remains normal in size. Other: Trace ascites is noted about the right side of the abdomen. Diffuse presacral soft tissue inflammation is again noted. There is diffuse soft tissue edema tracking about the anorectal canal, without definite evidence of abscess. Edema is seen tracking along the right side of the gluteal cleft, similar in appearance to the prior study. This may reflect a persistent subcutaneous abscess, given stability from the prior study, though not well assessed on delayed images. A small drainage catheter is noted posterior to the anorectal canal. Scrotal wall edema is noted, worse on the right, with small bilateral hydroceles. Musculoskeletal: No acute osseous abnormalities are identified. The visualized musculature is unremarkable in appearance. IMPRESSION: 1. Edema tracking along the right side of the gluteal cleft is similar in appearance to the prior study. This reflected a subcutaneous abscess on the prior study. This most likely reflects a persistent small subcutaneous abscess. 2. Diffuse soft tissue edema tracking about the anorectal canal, without additional abscess. Diffuse presacral soft tissue inflammation noted. 3. Scrotal wall edema, worse on the right, with small bilateral hydroceles. 4. Mild right basilar airspace opacity raises concern for pneumonia. 5. Mild wall thickening along the distal  sigmoid colon and rectum, raising concern for proctitis. 6. Enlarged azygoesophageal recess, retroperitoneal, pelvic sidewall and bilateral inguinal nodes seen, measuring up to 1.7 cm in short axis. This likely reflects the underlying infection. 7. Cholelithiasis.  Gallbladder otherwise unremarkable. Electronically Signed   By: JGarald BaldingM.D.   On: 12/21/2016 01:29  Not all labs, radiology exams or other studies done during hospitalization come through on my EPIC note; however they are reviewed by me.    Assessment and Plan  DISSEMINATED CRYPTOCOCCAL INFECTION/ENCEPHALITIS/ACUTE RESPIRATORY FAILURE WITH HYPOXIA/CRYPTOCOCCAL PNEUMONIA-patient required multiple lumbar punctures to decrease intracranial pressure. Patient completed 20 day course of amphotericin and flucytosine for both his disseminated infection and his cryptococcal pneumonia. CSF cultures were negative at the time of discharge and they are negative as of today SNF -it is not clear whether patient should be on fluconazole or not so will call ID to failure to clarify; patient is supposed to be off of antibiotics for 4 weeks. Patient is admitted for OT/PT  AKI/ACUTE URINARY RETENTION-secondary urinary retention and possibly amphotericin injury retention etiology unknown; failed voiding trial twice; patient will continue with Foley in place and recommend outpatient urology follow-up; patient's creatinine went to a high of 2.84 and was 0.86 on discharge SNF -will continue follow until urology outpatient appointment; we will follow up BMP  HYPONATREMIA-stable; considered likely secondary to poor nutrition SNF -will follow-up BMP  HIV/AIDS-patient with a CD4 count of 21 and a viral load of greater than 1 million. 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 Sheppard Coil, MD

## 2017-02-08 ENCOUNTER — Encounter: Payer: Self-pay | Admitting: Internal Medicine

## 2017-02-08 DIAGNOSIS — A6000 Herpesviral infection of urogenital system, unspecified: Secondary | ICD-10-CM | POA: Insufficient documentation

## 2017-02-08 DIAGNOSIS — D638 Anemia in other chronic diseases classified elsewhere: Secondary | ICD-10-CM | POA: Insufficient documentation

## 2017-02-08 DIAGNOSIS — K5900 Constipation, unspecified: Secondary | ICD-10-CM | POA: Insufficient documentation

## 2017-02-08 DIAGNOSIS — R338 Other retention of urine: Secondary | ICD-10-CM | POA: Insufficient documentation

## 2017-02-08 LAB — ACID FAST CULTURE WITH REFLEXED SENSITIVITIES (MYCOBACTERIA)

## 2017-02-08 LAB — ACID FAST CULTURE WITH REFLEXED SENSITIVITIES: ACID FAST CULTURE - AFSCU3: NEGATIVE

## 2017-02-09 ENCOUNTER — Telehealth: Payer: Self-pay | Admitting: Internal Medicine

## 2017-02-09 NOTE — Telephone Encounter (Signed)
I received a phone call today from William Pena at [facility] skilled nursing facility. She wanted to clarify some instructions in William Pena's discharge summary, specifically the following statements:  "Infectious disease was consulted for recommendations. Antiretrovirals are to be held for 4 weeks after discharge. Fluconazole was discontinued at discharge for 4 weeks before restarting antiretrovirals."  However the discharge summary indicated that he was supposed to be taking fluconazole. I clarified that he is supposed to be on fluconazole 400 mg daily. We decided to not start antiretroviral therapy until he completes induction therapy with fluconazole. He will stay on that same dose until he is seen by my partner, William Pena, in our clinic on 03/14/2017.

## 2017-02-10 LAB — ACID FAST CULTURE WITH REFLEXED SENSITIVITIES: ACID FAST CULTURE - AFSCU3: NEGATIVE

## 2017-02-10 LAB — ACID FAST CULTURE WITH REFLEXED SENSITIVITIES (MYCOBACTERIA)

## 2017-02-13 LAB — CBC AND DIFFERENTIAL
HCT: 25 — AB (ref 41–53)
HEMOGLOBIN: 8.1 — AB (ref 13.5–17.5)
Platelets: 145 — AB (ref 150–399)
WBC: 4.2

## 2017-02-13 LAB — BASIC METABOLIC PANEL
BUN: 26 — AB (ref 4–21)
Creatinine: 0.9 (ref 0.6–1.3)
GLUCOSE: 86
POTASSIUM: 4.6 (ref 3.4–5.3)
SODIUM: 135 — AB (ref 137–147)

## 2017-02-15 ENCOUNTER — Encounter (HOSPITAL_COMMUNITY): Payer: Self-pay | Admitting: *Deleted

## 2017-02-15 ENCOUNTER — Emergency Department (HOSPITAL_COMMUNITY): Payer: Medicaid Other

## 2017-02-15 ENCOUNTER — Inpatient Hospital Stay (HOSPITAL_COMMUNITY): Payer: Medicaid Other

## 2017-02-15 ENCOUNTER — Inpatient Hospital Stay (HOSPITAL_COMMUNITY)
Admission: EM | Admit: 2017-02-15 | Discharge: 2017-03-17 | DRG: 974 | Disposition: A | Discharge status: Skilled Nursing Facility | Payer: Medicaid Other | Attending: Nephrology | Admitting: Nephrology

## 2017-02-15 DIAGNOSIS — R402432 Glasgow coma scale score 3-8, at arrival to emergency department: Secondary | ICD-10-CM | POA: Diagnosis present

## 2017-02-15 DIAGNOSIS — E871 Hypo-osmolality and hyponatremia: Secondary | ICD-10-CM | POA: Diagnosis not present

## 2017-02-15 DIAGNOSIS — Z7401 Bed confinement status: Secondary | ICD-10-CM

## 2017-02-15 DIAGNOSIS — E860 Dehydration: Secondary | ICD-10-CM | POA: Diagnosis not present

## 2017-02-15 DIAGNOSIS — G0490 Encephalitis and encephalomyelitis, unspecified: Secondary | ICD-10-CM | POA: Diagnosis present

## 2017-02-15 DIAGNOSIS — A419 Sepsis, unspecified organism: Secondary | ICD-10-CM | POA: Diagnosis present

## 2017-02-15 DIAGNOSIS — D72819 Decreased white blood cell count, unspecified: Secondary | ICD-10-CM

## 2017-02-15 DIAGNOSIS — D638 Anemia in other chronic diseases classified elsewhere: Secondary | ICD-10-CM

## 2017-02-15 DIAGNOSIS — B450 Pulmonary cryptococcosis: Secondary | ICD-10-CM | POA: Diagnosis not present

## 2017-02-15 DIAGNOSIS — B451 Cerebral cryptococcosis: Secondary | ICD-10-CM | POA: Diagnosis present

## 2017-02-15 DIAGNOSIS — Z91018 Allergy to other foods: Secondary | ICD-10-CM

## 2017-02-15 DIAGNOSIS — Z7189 Other specified counseling: Secondary | ICD-10-CM | POA: Diagnosis not present

## 2017-02-15 DIAGNOSIS — Z681 Body mass index (BMI) 19 or less, adult: Secondary | ICD-10-CM

## 2017-02-15 DIAGNOSIS — R627 Adult failure to thrive: Secondary | ICD-10-CM | POA: Diagnosis present

## 2017-02-15 DIAGNOSIS — S31809D Unspecified open wound of unspecified buttock, subsequent encounter: Secondary | ICD-10-CM | POA: Diagnosis not present

## 2017-02-15 DIAGNOSIS — Z888 Allergy status to other drugs, medicaments and biological substances status: Secondary | ICD-10-CM | POA: Diagnosis not present

## 2017-02-15 DIAGNOSIS — K611 Rectal abscess: Secondary | ICD-10-CM | POA: Diagnosis present

## 2017-02-15 DIAGNOSIS — I69351 Hemiplegia and hemiparesis following cerebral infarction affecting right dominant side: Secondary | ICD-10-CM

## 2017-02-15 DIAGNOSIS — T148XXA Other injury of unspecified body region, initial encounter: Secondary | ICD-10-CM | POA: Diagnosis present

## 2017-02-15 DIAGNOSIS — E876 Hypokalemia: Secondary | ICD-10-CM | POA: Diagnosis present

## 2017-02-15 DIAGNOSIS — B20 Human immunodeficiency virus [HIV] disease: Secondary | ICD-10-CM | POA: Diagnosis present

## 2017-02-15 DIAGNOSIS — M6250 Muscle wasting and atrophy, not elsewhere classified, unspecified site: Secondary | ICD-10-CM | POA: Diagnosis present

## 2017-02-15 DIAGNOSIS — S31809A Unspecified open wound of unspecified buttock, initial encounter: Secondary | ICD-10-CM | POA: Diagnosis present

## 2017-02-15 DIAGNOSIS — Z515 Encounter for palliative care: Secondary | ICD-10-CM | POA: Diagnosis present

## 2017-02-15 DIAGNOSIS — E46 Unspecified protein-calorie malnutrition: Secondary | ICD-10-CM

## 2017-02-15 DIAGNOSIS — Z79899 Other long term (current) drug therapy: Secondary | ICD-10-CM | POA: Diagnosis not present

## 2017-02-15 DIAGNOSIS — G932 Benign intracranial hypertension: Secondary | ICD-10-CM | POA: Diagnosis not present

## 2017-02-15 DIAGNOSIS — D649 Anemia, unspecified: Secondary | ICD-10-CM | POA: Diagnosis not present

## 2017-02-15 DIAGNOSIS — G9340 Encephalopathy, unspecified: Secondary | ICD-10-CM | POA: Diagnosis present

## 2017-02-15 DIAGNOSIS — Z66 Do not resuscitate: Secondary | ICD-10-CM | POA: Diagnosis present

## 2017-02-15 DIAGNOSIS — E43 Unspecified severe protein-calorie malnutrition: Secondary | ICD-10-CM | POA: Diagnosis present

## 2017-02-15 DIAGNOSIS — Z87891 Personal history of nicotine dependence: Secondary | ICD-10-CM

## 2017-02-15 DIAGNOSIS — N179 Acute kidney failure, unspecified: Secondary | ICD-10-CM | POA: Diagnosis present

## 2017-02-15 DIAGNOSIS — B458 Other forms of cryptococcosis: Secondary | ICD-10-CM | POA: Diagnosis not present

## 2017-02-15 DIAGNOSIS — Z880 Allergy status to penicillin: Secondary | ICD-10-CM | POA: Diagnosis not present

## 2017-02-15 DIAGNOSIS — D469 Myelodysplastic syndrome, unspecified: Secondary | ICD-10-CM | POA: Diagnosis not present

## 2017-02-15 DIAGNOSIS — Z833 Family history of diabetes mellitus: Secondary | ICD-10-CM | POA: Diagnosis not present

## 2017-02-15 DIAGNOSIS — Z789 Other specified health status: Secondary | ICD-10-CM | POA: Diagnosis not present

## 2017-02-15 DIAGNOSIS — D61818 Other pancytopenia: Secondary | ICD-10-CM | POA: Diagnosis not present

## 2017-02-15 DIAGNOSIS — Z960 Presence of urogenital implants: Secondary | ICD-10-CM | POA: Diagnosis not present

## 2017-02-15 DIAGNOSIS — R4182 Altered mental status, unspecified: Secondary | ICD-10-CM

## 2017-02-15 LAB — CBC WITH DIFFERENTIAL/PLATELET
Basophils Absolute: 0 10*3/uL (ref 0.0–0.1)
Basophils Relative: 0 %
EOS PCT: 0 %
Eosinophils Absolute: 0 10*3/uL (ref 0.0–0.7)
HCT: 22.5 % — ABNORMAL LOW (ref 39.0–52.0)
HEMOGLOBIN: 7.6 g/dL — AB (ref 13.0–17.0)
LYMPHS ABS: 0.7 10*3/uL (ref 0.7–4.0)
LYMPHS PCT: 13 %
MCH: 28.6 pg (ref 26.0–34.0)
MCHC: 33.8 g/dL (ref 30.0–36.0)
MCV: 84.6 fL (ref 78.0–100.0)
MONOS PCT: 7 %
Monocytes Absolute: 0.4 10*3/uL (ref 0.1–1.0)
Neutro Abs: 4.3 10*3/uL (ref 1.7–7.7)
Neutrophils Relative %: 80 %
PLATELETS: 316 10*3/uL (ref 150–400)
RBC: 2.66 MIL/uL — AB (ref 4.22–5.81)
RDW: 19 % — ABNORMAL HIGH (ref 11.5–15.5)
WBC: 5.4 10*3/uL (ref 4.0–10.5)

## 2017-02-15 LAB — URINALYSIS, ROUTINE W REFLEX MICROSCOPIC
Bilirubin Urine: NEGATIVE
Glucose, UA: NEGATIVE mg/dL
Ketones, ur: NEGATIVE mg/dL
Nitrite: POSITIVE — AB
Protein, ur: 30 mg/dL — AB
Specific Gravity, Urine: 1.021 (ref 1.005–1.030)
pH: 6 (ref 5.0–8.0)

## 2017-02-15 LAB — BASIC METABOLIC PANEL
ANION GAP: 6 (ref 5–15)
BUN: 50 mg/dL — AB (ref 6–20)
CHLORIDE: 109 mmol/L (ref 101–111)
CO2: 19 mmol/L — ABNORMAL LOW (ref 22–32)
Calcium: 8.5 mg/dL — ABNORMAL LOW (ref 8.9–10.3)
Creatinine, Ser: 2.09 mg/dL — ABNORMAL HIGH (ref 0.61–1.24)
GFR, EST AFRICAN AMERICAN: 47 mL/min — AB (ref >60)
GFR, EST NON AFRICAN AMERICAN: 41 mL/min — AB (ref >60)
Glucose, Bld: 122 mg/dL — ABNORMAL HIGH (ref 65–99)
POTASSIUM: 4.2 mmol/L (ref 3.5–5.1)
SODIUM: 134 mmol/L — AB (ref 135–145)

## 2017-02-15 LAB — I-STAT CG4 LACTIC ACID, ED: Lactic Acid, Venous: 1.13 mmol/L (ref 0.5–1.9)

## 2017-02-15 MED ORDER — SULFAMETHOXAZOLE-TRIMETHOPRIM 800-160 MG PO TABS
1.0000 | ORAL_TABLET | Freq: Every day | ORAL | Status: DC
  Filled 2017-02-15: qty 1

## 2017-02-15 MED ORDER — ENSURE ENLIVE PO LIQD
237.0000 mL | Freq: Two times a day (BID) | ORAL | Status: DC
  Administered 2017-02-17 – 2017-03-04 (×21): 237 mL via ORAL

## 2017-02-15 MED ORDER — NYSTATIN 100000 UNIT/ML MT SUSP
5.0000 mL | Freq: Four times a day (QID) | OROMUCOSAL | Status: DC
  Administered 2017-02-15 – 2017-03-08 (×69): 500000 [IU] via ORAL
  Filled 2017-02-15 (×69): qty 5

## 2017-02-15 MED ORDER — SODIUM CHLORIDE 0.9 % IV BOLUS (SEPSIS)
1000.0000 mL | Freq: Once | INTRAVENOUS | Status: AC
  Administered 2017-02-15: 1000 mL via INTRAVENOUS

## 2017-02-15 MED ORDER — SODIUM CHLORIDE 0.9 % IV SOLN
INTRAVENOUS | Status: DC
  Administered 2017-02-15 – 2017-02-23 (×10): via INTRAVENOUS

## 2017-02-15 MED ORDER — DEXTROSE 5 % IV SOLN
2.0000 g | Freq: Once | INTRAVENOUS | Status: AC
  Administered 2017-02-15: 2 g via INTRAVENOUS
  Filled 2017-02-15: qty 2

## 2017-02-15 MED ORDER — FLUCONAZOLE 100 MG PO TABS
400.0000 mg | ORAL_TABLET | Freq: Every day | ORAL | Status: DC
  Filled 2017-02-15: qty 4

## 2017-02-15 MED ORDER — AZITHROMYCIN 600 MG PO TABS: 1200.0000 mg | ORAL_TABLET | ORAL | Status: DC

## 2017-02-15 MED ORDER — VANCOMYCIN HCL IN DEXTROSE 1-5 GM/200ML-% IV SOLN
1000.0000 mg | Freq: Once | INTRAVENOUS | Status: AC
  Administered 2017-02-15: 1000 mg via INTRAVENOUS
  Filled 2017-02-15: qty 200

## 2017-02-15 MED ORDER — LEVOFLOXACIN IN D5W 750 MG/150ML IV SOLN: 750.0000 mg | INTRAVENOUS | Status: DC

## 2017-02-15 MED ORDER — LEVOFLOXACIN IN D5W 750 MG/150ML IV SOLN
750.0000 mg | Freq: Once | INTRAVENOUS | Status: AC
  Administered 2017-02-15: 750 mg via INTRAVENOUS
  Filled 2017-02-15: qty 150

## 2017-02-15 MED ORDER — PRO-STAT SUGAR FREE PO LIQD
30.0000 mL | Freq: Every day | ORAL | Status: DC
  Administered 2017-02-15: 30 mL via ORAL
  Filled 2017-02-15: qty 30

## 2017-02-15 MED ORDER — VANCOMYCIN HCL 500 MG IV SOLR
500.0000 mg | Freq: Two times a day (BID) | INTRAVENOUS | Status: DC
  Administered 2017-02-16: 500 mg via INTRAVENOUS
  Filled 2017-02-15 (×2): qty 500

## 2017-02-15 MED ORDER — TAMSULOSIN HCL 0.4 MG PO CAPS
0.4000 mg | ORAL_CAPSULE | Freq: Every day | ORAL | Status: DC
  Administered 2017-02-17 – 2017-03-17 (×24): 0.4 mg via ORAL
  Filled 2017-02-15 (×27): qty 1

## 2017-02-15 MED ORDER — DEXTROSE 5 % IV SOLN
2.0000 g | INTRAVENOUS | Status: DC
  Filled 2017-02-15: qty 2

## 2017-02-15 MED ORDER — ACETAMINOPHEN 650 MG RE SUPP
650.0000 mg | Freq: Four times a day (QID) | RECTAL | Status: DC | PRN
  Administered 2017-02-16: 650 mg via RECTAL
  Filled 2017-02-15 (×2): qty 1

## 2017-02-15 MED ORDER — ACETAMINOPHEN 325 MG PO TABS: 650.0000 mg | ORAL_TABLET | Freq: Four times a day (QID) | ORAL | Status: DC | PRN

## 2017-02-15 MED ORDER — AMANTADINE HCL 50 MG/5ML PO SYRP
100.0000 mg | ORAL_SOLUTION | Freq: Two times a day (BID) | ORAL | Status: DC
  Administered 2017-02-16 – 2017-03-07 (×36): 100 mg via ORAL
  Filled 2017-02-15 (×45): qty 10

## 2017-02-15 MED ORDER — POLYETHYLENE GLYCOL 3350 17 G PO PACK
17.0000 g | PACK | Freq: Every day | ORAL | Status: DC | PRN
  Administered 2017-03-02: 17 g via ORAL
  Filled 2017-02-15: qty 1

## 2017-02-15 MED ORDER — HYDROCODONE-ACETAMINOPHEN 5-325 MG PO TABS
1.0000 | ORAL_TABLET | Freq: Four times a day (QID) | ORAL | Status: DC | PRN
  Administered 2017-02-18 – 2017-02-27 (×4): 1 via ORAL
  Filled 2017-02-15 (×4): qty 1

## 2017-02-15 MED ORDER — DEXTROSE 5 % IV SOLN
2.0000 g | Freq: Once | INTRAVENOUS | Status: DC
  Filled 2017-02-15: qty 2

## 2017-02-15 NOTE — H&P (Signed)
History and Physical    William Pena WVP:[id number] DOB: 1986-05-22 DOA: 02/15/2017  PCP: [name], MD  Patient coming from: SNF  Chief Complaint: Encephalopathy  HPI: William Pena is a 31 y.o. male with medical history significant of AIDS, recent admission for perirectal abscess, development of cryptococcal meningoencephalitis, recent CVA,  ARF, severe protein calorie malnutrition who presents to the ED from facility with increased mental status change. Patient was in usual state of health. On recent discharge (dated 7/9), patient was recommended by ID to continue fluconazole x 4 more weeks before resuming antiviral meds for HIV. Patient is non-verbal at baseline. Patient's mother in room who states that patient noted to have poor PO intake recently.  ED Course: In the ED, patient was noted to have soft BP, initial tachycardia with HR to the 120's, and elevated Cr of 2.09 (previous Cr of 0.86 on 7/9). Patient was given 2L of NS with resolution of tachycardia and return of mentation to near baseline. Given concerns of recurrent encephalopathy, ID was consulted through the ED. Hospitalist consulted for consideration for admission.  Review of Systems:  Review of Systems  Unable to perform ROS: Patient nonverbal    Past Medical History:  Diagnosis Date  . Acute respiratory failure ([place]) 12/23/2016  . AIDS (acquired immune deficiency syndrome) ([place]) 07/07/2016  . AKI (acute kidney injury) ([place])   . Cavitary pneumonia   . Cryptococcal meningoencephalitis ([place])   . Diffuse lymphadenopathy   . 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 Drug rash   . Elevated intracranial pressure   . Elevated LFTs 12/21/2016  . 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 Encephalopathy acute 12/23/2016  . HIV (human immunodeficiency virus infection) ([place])   . HIV disease ([place])   . Hypomagnesemia 12/23/2016  . Hyponatremia 12/21/2016  . Lymphadenopathy of head and neck   . Meningitis   . Normocytic anemia 12/21/2016    . Perirectal abscess   . Protein-calorie malnutrition, severe 01/28/2017  . Pulmonary infiltrates   . SIRS (systemic inflammatory response syndrome) ([place]) 12/23/2016    Past Surgical History:  Procedure Laterality Date  . FLEXIBLE SIGMOIDOSCOPY  04/04/2008  . INCISION AND DRAINAGE PERIRECTAL ABSCESS    . INCISION AND DRAINAGE PERIRECTAL ABSCESS N/A 12/21/2016   Procedure: IRRIGATION AND DEBRIDEMENT PERIRECTAL ABSCESS;  Surgeon: Ralene Ok, MD;  Location: [facility];  Service: General;  Laterality: N/A;  . PLACEMENT OF LUMBAR DRAIN N/A 12/27/2016   Procedure: PLACEMENT OF LUMBAR DRAIN;  Surgeon: Consuella Lose, MD;  Location: [facility];  Service: Neurosurgery;  Laterality: N/A;     reports that he has quit smoking. His smoking use included Cigarettes. He smoked 0.33 packs per day. He has never used smokeless tobacco. He reports that he does not drink alcohol or use drugs.  Allergies  Allergen Reactions  . Carrot Oil Swelling    "throat swell"   . Augmentin [Amoxicillin-Pot Clavulanate] Rash    "break out"    Family History  Problem Relation Age of Onset  . Diabetes Other     Prior to Admission medications   Medication Sig Start Date End Date Taking? Authorizing Provider  amantadine (SYMMETREL) 50 MG/5ML solution Take 10 mLs (100 mg total) by mouth 2 (two) times daily with breakfast and lunch. 02/07/17   Mariel Aloe, MD  Amino Acids-Protein Hydrolys (FEEDING SUPPLEMENT, PRO-STAT SUGAR FREE 64,) LIQD Take 30 mLs by mouth at bedtime. 02/06/17   Mariel Aloe, MD  azithromycin (ZITHROMAX) 600 MG tablet Take 2 tablets (  1,200 mg total) by mouth once a week. 02/10/17   Mariel Aloe, MD  docusate sodium (COLACE) 100 MG capsule Take 100 mg by mouth 2 (two) times daily.    [provider]  feeding supplement, ENSURE ENLIVE, (ENSURE ENLIVE) LIQD Take 237 mLs by mouth 2 (two) times daily between meals. 02/06/17   Mariel Aloe, MD  fluconazole (DIFLUCAN) 200 MG tablet Take 2  tablets (400 mg total) by mouth daily. 02/07/17   Mariel Aloe, MD  HYDROcodone-acetaminophen (NORCO/VICODIN) 5-325 MG tablet Take 1 tablet by mouth every 6 (six) hours as needed for moderate pain. 02/06/17   Mariel Aloe, MD  magnesium oxide (MAG-OX) 400 (241.3 Mg) MG tablet Take 1 tablet (400 mg total) by mouth 2 (two) times daily. 02/06/17   Mariel Aloe, MD  Multiple Vitamin (MULTIVITAMIN WITH MINERALS) TABS tablet Take 1 tablet by mouth daily. 02/07/17   Mariel Aloe, MD  ondansetron (ZOFRAN ODT) 4 MG disintegrating tablet Take 1 tablet (4 mg total) by mouth every 8 (eight) hours as needed for nausea or vomiting. 12/18/16   Quintella Reichert, MD  polyethylene glycol [facility] / Floria Raveling) packet Take 17 g by mouth daily as needed for mild constipation. 02/06/17   Mariel Aloe, MD  senna-docusate (SENOKOT-S) 8.6-50 MG tablet Take 2 tablets by mouth 2 (two) times daily. 02/06/17   Mariel Aloe, MD  sulfamethoxazole-trimethoprim (BACTRIM DS,SEPTRA DS) 800-160 MG tablet Take 1 tablet by mouth daily. 02/07/17   Mariel Aloe, MD  tamsulosin (FLOMAX) 0.4 MG CAPS capsule Take 1 capsule (0.4 mg total) by mouth daily. 02/07/17   Mariel Aloe, MD  thiamine 100 MG tablet Take 1 tablet (100 mg total) by mouth daily. 02/07/17   Mariel Aloe, MD    Physical Exam: Vitals:   02/15/17 1330 02/15/17 1345 02/15/17 1400 02/15/17 1415  BP: 106/72 109/85 109/75 112/75  Pulse:   100   Resp: 20 19 20 18   Temp:      TempSrc:      SpO2: 100%  100%     Constitutional: NAD, calm, comfortable Vitals:   02/15/17 1330 02/15/17 1345 02/15/17 1400 02/15/17 1415  BP: 106/72 109/85 109/75 112/75  Pulse:   100   Resp: 20 19 20 18   Temp:      TempSrc:      SpO2: 100%  100%    Eyes: PERRL, lids and conjunctivae normal ENMT: Mucous membranes are moist. Posterior pharynx clear of any exudate or lesions.Normal dentition.  Neck: normal, supple, no masses, no thyromegaly Respiratory: clear to auscultation  bilaterally, no wheezing, no crackles. Normal respiratory effort. No accessory muscle use.  Cardiovascular: Regular rate and rhythm Abdomen: no tenderness, no masses palpated. No hepatosplenomegaly. Bowel sounds positive.  Musculoskeletal: no clubbing / cyanosis. No joint deformity upper and lower extremities. Good ROM, no contractures. Normal muscle tone.  Skin: no rashes, lesions Neurologic: CN 2-12 grossly intact. Sensation intact, DTR normal. Strength 5/5 in all 4.  Psychiatric: Difficult to assess given encephalopathy    Labs on Admission: I have personally reviewed following labs and imaging studies  CBC:  Recent Labs Lab 02/15/17 1232  WBC 5.4  NEUTROABS 4.3  HGB 7.6*  HCT 22.5*  MCV 84.6  PLT 761   Basic Metabolic Panel:  Recent Labs Lab 02/15/17 1413  NA 134*  K 4.2  CL 109  CO2 19*  GLUCOSE 122*  BUN 50*  CREATININE 2.09*  CALCIUM 8.5*  GFR: Estimated Creatinine Clearance: 44.7 mL/min (A) (by C-G formula based on SCr of 2.09 mg/dL (H)). Liver Function Tests: No results for input(s): AST, ALT, ALKPHOS, BILITOT, PROT, ALBUMIN in the last 168 hours. No results for input(s): LIPASE, AMYLASE in the last 168 hours. No results for input(s): AMMONIA in the last 168 hours. Coagulation Profile: No results for input(s): INR, PROTIME in the last 168 hours. Cardiac Enzymes: No results for input(s): CKTOTAL, CKMB, CKMBINDEX, TROPONINI in the last 168 hours. BNP (last 3 results) No results for input(s): PROBNP in the last 8760 hours. HbA1C: No results for input(s): HGBA1C in the last 72 hours. CBG: No results for input(s): GLUCAP in the last 168 hours. Lipid Profile: No results for input(s): CHOL, HDL, LDLCALC, TRIG, CHOLHDL, LDLDIRECT in the last 72 hours. Thyroid Function Tests: No results for input(s): TSH, T4TOTAL, FREET4, T3FREE, THYROIDAB in the last 72 hours. Anemia Panel: No results for input(s): VITAMINB12, FOLATE, FERRITIN, TIBC, IRON, RETICCTPCT in the  last 72 hours. Urine analysis:    Component Value Date/Time   COLORURINE YELLOW 02/15/2017 1342   APPEARANCEUR HAZY (A) 02/15/2017 1342   LABSPEC 1.021 02/15/2017 1342   PHURINE 6.0 02/15/2017 1342   GLUCOSEU NEGATIVE 02/15/2017 1342   HGBUR SMALL (A) 02/15/2017 1342   BILIRUBINUR NEGATIVE 02/15/2017 1342   KETONESUR NEGATIVE 02/15/2017 1342   PROTEINUR 30 (A) 02/15/2017 1342   NITRITE POSITIVE (A) 02/15/2017 1342   LEUKOCYTESUR TRACE (A) 02/15/2017 1342   Sepsis Labs: !!!!!!!!!!!!!!!!!!!!!!!!!!!!!!!!!!!!!!!!!!!! @LABRCNTIP (procalcitonin:4,lacticidven:4) )No results found for this or any previous visit (from the past 240 hour(s)).   Radiological Exams on Admission: Dg Chest 1 View  Result Date: 02/15/2017 CLINICAL DATA:  Respiratory failure.  HIV disease. EXAM: CHEST 1 VIEW COMPARISON:  January 08, 2017 FINDINGS: There is no edema or consolidation. Heart size and pulmonary vascularity are normal. No adenopathy. No evident bone lesions. IMPRESSION: No edema or consolidation. Electronically Signed   By: Lowella Grip III M.D.   On: 02/15/2017 13:30    EKG: Independently reviewed. Sinus tach  Assessment/Plan Principal Problem:   AKI (acute kidney injury) ([place]) Active Problems:   AIDS (acquired immune deficiency syndrome) (H[facility])   Encephalopathy acute   Cryptococcal meningoencephalitis (H[facility])   HIV (human immunodeficiency virus infection) ([place])   Protein-calorie malnutrition, severe   Anemia of chronic disease  1. ARF 1. Presenting Cr of over 2, baseline under 1 2. Patient given 2 L NS in ED with resolution of tachycardia and improvement in mentation 3. Will continue on basal IVF 4. Repeat bmet in AM 2. HIV/AIDS 1. Prior records reviewed 2. ID had recommended completing course of fluconazole (now 3 more weeks) before resuming antiviral regimen 3. Given recent complicated hospital course, have asked ID to follow for any further recs at this time 3. Acute  encephalopathy 1. Mental status improved with IVF in ED 2. Afebrile 3. Recent known hx of crypto meningoencephalitis 4. Will request non-contrast head CT to r/o acute process 4. Severe protein calorie malnutrition 1. Stable at this time 5. Anemia of chronic disease 1. Hemodynamically stable 2. Labs reviewed. Presenting hgb unchanged from baseline 3. Repeat CBC in AM  DVT prophylaxis: SCD's  Code Status: Full Family Communication: Pt in room, family not at bedside  Disposition Plan: Uncertain at this time  Consults called: ID through ED Admission status: Inpatient as will likely require greater than 2 midnight stay to resolve ARF and to correct dehydration   Nyle Limb, Orpah Melter MD [facility] Pager [phone]  If 7PM-7AM, please contact night-coverage [email or web address] Password [email or web address]  02/15/2017, 3:22 PM

## 2017-02-15 NOTE — Progress Notes (Signed)
Pharmacy Antibiotic Note  William Pena is a 31 y.o. male admitted on 02/15/2017 with sepsis.  Pharmacy has been consulted for vancomycin, cefepime and levaquin dosing. Pt is well known to pharmacy for recent cryptococcal meningitis. Pt is afebrile and WBC is WNL. SCr is elevated above baseline at 2.09. Lactic acid is WNL.   Plan: Levaquin 750mg  IV Q48H Cefepime 2g IV Q24h Vanc 1000mg  IV x 1 then 500mg  IV Q12H F/u renal fxn, C&S, clinical status and trough at SS     Temp (24hrs), Avg:99.9 F (37.7 C), Min:99.9 F (37.7 C), Max:99.9 F (37.7 C)   Recent Labs Lab 02/15/17 1232 02/15/17 1259 02/15/17 1413  WBC 5.4  --   --   CREATININE  --   --  2.09*  LATICACIDVEN  --  1.13  --     Estimated Creatinine Clearance: 44.7 mL/min (A) (by C-G formula based on SCr of 2.09 mg/dL (H)).    Allergies  Allergen Reactions  . Carrot Oil Swelling    "throat swell"   . Augmentin [Amoxicillin-Pot Clavulanate] Rash    "break out"    Antimicrobials this admission: 7/18 levaquin>> 7/18 cefepime>> 7/18 vanc>>  Dose adjustments this admission: N/A  Microbiology results: Pending  Thank you for allowing pharmacy to be a part of this patient's care.  Estes Lehner, Rande Lawman 02/15/2017 3:16 PM

## 2017-02-15 NOTE — ED Triage Notes (Signed)
Pt arrives from [facility] via [facility]. Pt was found by staff today to be altered from baseline. Pt is normally alert and oriented to name. Pt is unresponsive upon arrival, but does respond to pain. Pt is diaphoretic and hot to the touch.

## 2017-02-15 NOTE — Progress Notes (Signed)
Pt admitted to 5M14 at this time.  Pt is nonverbal.  He will only respond to his name by slightly opening his eyes when asked.  Mother is at bedside and able to give some information about her son.  Bed alarm set, call bell within reach.

## 2017-02-15 NOTE — ED Provider Notes (Signed)
[facility] DEPT Provider Note   CSN: [id number] Arrival date & time: 02/15/17  1216     History   Chief Complaint Chief Complaint  Patient presents with  . Altered Mental Status  Level V caveat due to altered mental status  HPI William Pena is a 31 y.o. male with AIDS, arriving from skilled nursing facility for altered mental status. The patient was admitted for nearly 2 months with disseminated  cryptococcal meningeoencephalitis,, perirectal abscess. The patient was treated with amphotericin B. The patient at baseline per the nursing staff at his rehabilitation facility, is nonverbal. They state that normally he occasionally will get up into his chair, attempting to feed himself. He occasionally will follow some commands and responds to his name with eye contact. I spoke with the nurse at the facility today. He states that this morning he was more listless than normal. When they went to check on him. He was not responding to voice or commands. They called EMS. The patient upon arrival is tachycardic, hypertensive and has a GCS of 5.   Altered Mental Status      Past Medical History:  Diagnosis Date  . Acute respiratory failure ([place]) 12/23/2016  . AIDS (acquired immune deficiency syndrome) ([place]) 07/07/2016  . AKI (acute kidney injury) ([place])   . Cavitary pneumonia   . Cryptococcal meningoencephalitis ([place])   . Diffuse lymphadenopathy   . 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 Drug rash   . Elevated intracranial pressure   . Elevated LFTs 12/21/2016  . 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 Encephalopathy acute 12/23/2016  . HIV (human immunodeficiency virus infection) ([place])   . HIV disease ([place])   . Hypomagnesemia 12/23/2016  . Hyponatremia 12/21/2016  . Lymphadenopathy of head and neck   . Meningitis   . Normocytic anemia 12/21/2016  . Perirectal abscess   . Protein-calorie malnutrition, severe 01/28/2017  . Pulmonary infiltrates   . SIRS (systemic inflammatory response syndrome)  ([place]) 12/23/2016    Patient Active Problem List   Diagnosis Date Noted  . Acute urinary retention 02/08/2017  . Genital herpes 02/08/2017  . Anemia of chronic disease 02/08/2017  . Constipation 02/08/2017  . Protein-calorie malnutrition, severe 01/28/2017  . Pressure injury of skin 01/16/2017  . AKI (acute kidney injury) ([place])   . HIV (human immunodeficiency virus infection) ([place])   . Meningitis   . 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 Acute blood loss anemia   . Elevated intracranial pressure   . HIV disease ([place])   . Lymphadenopathy of head and neck   . Rectal abscess   . Cryptococcal meningoencephalitis ([place])   . 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 Cavitary pneumonia   . Diffuse lymphadenopathy   . Drug rash   . Hypomagnesemia 12/23/2016  . Encephalopathy acute 12/23/2016  . SIRS (systemic inflammatory response syndrome) ([place]) 12/23/2016  . Acute respiratory failure ([place]) 12/23/2016  . Pulmonary infiltrates   . Perirectal abscess 12/21/2016  . Hyponatremia 12/21/2016  . Normocytic anemia 12/21/2016  . Elevated LFTs 12/21/2016  . AIDS (acquired immune deficiency syndrome) ([place]) 07/07/2016  . Chronic pain 06/20/2011    Past Surgical History:  Procedure Laterality Date  . FLEXIBLE SIGMOIDOSCOPY  04/04/2008  . INCISION AND DRAINAGE PERIRECTAL ABSCESS    . INCISION AND DRAINAGE PERIRECTAL ABSCESS N/A 12/21/2016   Procedure: IRRIGATION AND DEBRIDEMENT PERIRECTAL ABSCESS;  Surgeon: Ralene Ok, MD;  Location: [facility];  Service: General;  Laterality: N/A;  . PLACEMENT OF LUMBAR DRAIN N/A 12/27/2016   Procedure:  PLACEMENT OF LUMBAR DRAIN;  Surgeon: Consuella Lose, MD;  Location: [facility];  Service: Neurosurgery;  Laterality: N/A;       Home Medications    Prior to Admission medications   Medication Sig Start Date End Date Taking? Authorizing Provider  amantadine (SYMMETREL) 50 MG/5ML solution Take 10 mLs (100 mg total) by mouth 2 (two) times daily with breakfast and lunch.  02/07/17  Yes Mariel Aloe, MD  Amino Acids-Protein Hydrolys (FEEDING SUPPLEMENT, PRO-STAT SUGAR FREE 64,) LIQD Take 30 mLs by mouth at bedtime. 02/06/17  Yes Mariel Aloe, MD  azithromycin (ZITHROMAX) 600 MG tablet Take 2 tablets (1,200 mg total) by mouth once a week. 02/10/17  Yes Mariel Aloe, MD  docusate sodium (COLACE) 100 MG capsule Take 100 mg by mouth 2 (two) times daily.   Yes [provider]  feeding supplement, ENSURE ENLIVE, (ENSURE ENLIVE) LIQD Take 237 mLs by mouth 2 (two) times daily between meals. 02/06/17  Yes Mariel Aloe, MD  fluconazole (DIFLUCAN) 200 MG tablet Take 2 tablets (400 mg total) by mouth daily. 02/07/17  Yes Mariel Aloe, MD  HYDROcodone-acetaminophen (NORCO/VICODIN) 5-325 MG tablet Take 1 tablet by mouth every 6 (six) hours as needed for moderate pain. 02/06/17  Yes Mariel Aloe, MD  magnesium oxide (MAG-OX) 400 (241.3 Mg) MG tablet Take 1 tablet (400 mg total) by mouth 2 (two) times daily. 02/06/17  Yes Mariel Aloe, MD  Multiple Vitamin (MULTIVITAMIN WITH MINERALS) TABS tablet Take 1 tablet by mouth daily. 02/07/17  Yes Mariel Aloe, MD  nystatin (MYCOSTATIN) 100000 UNIT/ML suspension Take 10 mLs by mouth 4 (four) times daily.   Yes [provider]  ondansetron (ZOFRAN ODT) 4 MG disintegrating tablet Take 1 tablet (4 mg total) by mouth every 8 (eight) hours as needed for nausea or vomiting. 12/18/16  Yes Quintella Reichert, MD  polyethylene glycol [facility] / Floria Raveling) packet Take 17 g by mouth daily as needed for mild constipation. 02/06/17  Yes Mariel Aloe, MD  senna-docusate (SENOKOT-S) 8.6-50 MG tablet Take 2 tablets by mouth 2 (two) times daily. 02/06/17  Yes Mariel Aloe, MD  sulfamethoxazole-trimethoprim (BACTRIM DS,SEPTRA DS) 800-160 MG tablet Take 1 tablet by mouth daily. 02/07/17  Yes Mariel Aloe, MD  tamsulosin (FLOMAX) 0.4 MG CAPS capsule Take 1 capsule (0.4 mg total) by mouth daily. 02/07/17  Yes Mariel Aloe, MD    thiamine 100 MG tablet Take 1 tablet (100 mg total) by mouth daily. 02/07/17  Yes Mariel Aloe, MD    Family History Family History  Problem Relation Age of Onset  . Diabetes Other     Social History Social History  Substance Use Topics  . Smoking status: Former Smoker    Packs/day: 0.33    Types: Cigarettes  . Smokeless tobacco: Never Used  . Alcohol use No     Allergies   Carrot oil and Augmentin [amoxicillin-pot clavulanate]   Review of Systems Review of Systems  Ten systems reviewed and are negative for acute change, except as noted in the HPI.   Physical Exam Updated Vital Signs BP 112/75   Pulse 100   Temp 99.9 F (37.7 C) (Rectal)   Resp 18   SpO2 100%   Physical Exam  HENT:  Head: Normocephalic and atraumatic.  Eyes: Pupils are equal, round, and reactive to light.  Neck: No JVD present.  Cardiovascular:  tachycardic  Pulmonary/Chest: Effort normal and breath sounds normal.  Abdominal: Soft.  Neurological: [name] unresponsive. He exhibits abnormal muscle tone (R arm is contracted). GCS eye subscore is 1. GCS verbal subscore is 1. GCS motor subscore is 4.  Skin: [name] diaphoretic.  Multipl ulcers of the left heel and decubitus region with erythema, drainage and packing in place  Nursing note and vitals reviewed.    ED Treatments / Results  Labs (all labs ordered are listed, but only abnormal results are displayed) Labs Reviewed  CBC WITH DIFFERENTIAL/PLATELET - Abnormal; Notable for the following:       Result Value   RBC 2.66 (*)    Hemoglobin 7.6 (*)    HCT 22.5 (*)    RDW 19.0 (*)    All other components within normal limits  URINALYSIS, ROUTINE W REFLEX MICROSCOPIC - Abnormal; Notable for the following:    APPearance HAZY (*)    Hgb urine dipstick SMALL (*)    Protein, ur 30 (*)    Nitrite POSITIVE (*)    Leukocytes, UA TRACE (*)    Bacteria, UA RARE (*)    Squamous Epithelial / LPF 0-5 (*)    All other components within normal  limits  BASIC METABOLIC PANEL - Abnormal; Notable for the following:    Sodium 134 (*)    CO2 19 (*)    Glucose, Bld 122 (*)    BUN 50 (*)    Creatinine, Ser 2.09 (*)    Calcium 8.5 (*)    GFR calc non Af Amer 41 (*)    GFR calc Af Amer 47 (*)    All other components within normal limits  CULTURE, BLOOD (ROUTINE X 2)  CULTURE, BLOOD (ROUTINE X 2)  I-STAT CG4 LACTIC ACID, ED  I-STAT CG4 LACTIC ACID, ED    EKG  EKG Interpretation None       Radiology Dg Chest 1 View  Result Date: 02/15/2017 CLINICAL DATA:  Respiratory failure.  HIV disease. EXAM: CHEST 1 VIEW COMPARISON:  January 08, 2017 FINDINGS: There is no edema or consolidation. Heart size and pulmonary vascularity are normal. No adenopathy. No evident bone lesions. IMPRESSION: No edema or consolidation. Electronically Signed   By: Lowella Grip III M.D.   On: 02/15/2017 13:30    Procedures Procedures (including critical care time)  Medications Ordered in ED Medications  ceFEPIme (MAXIPIME) 2 g in dextrose 5 % 50 mL IVPB (not administered)  levofloxacin (LEVAQUIN) IVPB 750 mg (not administered)  vancomycin (VANCOCIN) 500 mg in sodium chloride 0.9 % 100 mL IVPB (not administered)  sodium chloride 0.9 % bolus 1,000 mL (0 mLs Intravenous Stopped 02/15/17 1436)    And  sodium chloride 0.9 % bolus 1,000 mL (0 mLs Intravenous Stopped 02/15/17 1436)  levofloxacin (LEVAQUIN) IVPB 750 mg (750 mg Intravenous New Bag/Given 02/15/17 1344)  vancomycin (VANCOCIN) IVPB 1000 mg/200 mL premix (0 mg Intravenous Stopped 02/15/17 1436)  ceFEPIme (MAXIPIME) 2 g in dextrose 5 % 50 mL IVPB (0 g Intravenous Stopped 02/15/17 1436)     Initial Impression / Assessment and Plan / ED Course  I have reviewed the triage vital signs and the nursing notes.  Pertinent labs & imaging results that were available during my care of the patient were reviewed by me and considered in my medical decision making (see chart for details).  Clinical Course as  of Feb 15 1517  Wed Feb 15, 2017  1421 Patient improved. [name] alert and looking around.  [AH]  1436 UA nitrite positive.  ? Uti although no  wbc  [AH]  1503 Acute renal failure. Creatinine: (!) 2.09 [AH]    Clinical Course User Index [AH] Margarita Mail, PA-C    Patient with Compazine. Past medical history, his hemoglobin is at baseline. He appears to be in acute renal failure. He has improved with the fluids and treatment for potential sepsis. The patient be admitted by try at hospitalist.  Final Clinical Impressions(s) / ED Diagnoses   Final diagnoses:  Encephalopathy  Acute renal failure, unspecified acute renal failure type [facility])    New Prescriptions New Prescriptions   No medications on file     Margarita Mail, PA-C 02/24/17 1638    Mesner, Corene Cornea, MD 02/25/17 1621

## 2017-02-16 ENCOUNTER — Inpatient Hospital Stay (HOSPITAL_COMMUNITY): Payer: Medicaid Other

## 2017-02-16 DIAGNOSIS — G932 Benign intracranial hypertension: Secondary | ICD-10-CM

## 2017-02-16 DIAGNOSIS — Z87891 Personal history of nicotine dependence: Secondary | ICD-10-CM

## 2017-02-16 DIAGNOSIS — A419 Sepsis, unspecified organism: Secondary | ICD-10-CM | POA: Diagnosis present

## 2017-02-16 DIAGNOSIS — Z91018 Allergy to other foods: Secondary | ICD-10-CM

## 2017-02-16 DIAGNOSIS — Z880 Allergy status to penicillin: Secondary | ICD-10-CM

## 2017-02-16 DIAGNOSIS — Z833 Family history of diabetes mellitus: Secondary | ICD-10-CM

## 2017-02-16 LAB — COMPREHENSIVE METABOLIC PANEL
ALT: 31 U/L (ref 17–63)
AST: 35 U/L (ref 15–41)
Albumin: 2 g/dL — ABNORMAL LOW (ref 3.5–5.0)
Alkaline Phosphatase: 94 U/L (ref 38–126)
Anion gap: 9 (ref 5–15)
BUN: 46 mg/dL — ABNORMAL HIGH (ref 6–20)
CHLORIDE: 115 mmol/L — AB (ref 101–111)
CO2: 14 mmol/L — AB (ref 22–32)
CREATININE: 1.54 mg/dL — AB (ref 0.61–1.24)
Calcium: 8.4 mg/dL — ABNORMAL LOW (ref 8.9–10.3)
GFR calc non Af Amer: 59 mL/min — ABNORMAL LOW (ref >60)
Glucose, Bld: 87 mg/dL (ref 65–99)
POTASSIUM: 4.3 mmol/L (ref 3.5–5.1)
SODIUM: 138 mmol/L (ref 135–145)
Total Bilirubin: 0.6 mg/dL (ref 0.3–1.2)
Total Protein: 7.6 g/dL (ref 6.5–8.1)

## 2017-02-16 LAB — CBC
HEMATOCRIT: 22.3 % — AB (ref 39.0–52.0)
HEMOGLOBIN: 7.1 g/dL — AB (ref 13.0–17.0)
MCH: 28 pg (ref 26.0–34.0)
MCHC: 31.8 g/dL (ref 30.0–36.0)
MCV: 87.8 fL (ref 78.0–100.0)
PLATELETS: 136 10*3/uL — AB (ref 150–400)
RBC: 2.54 MIL/uL — AB (ref 4.22–5.81)
RDW: 16.5 % — ABNORMAL HIGH (ref 11.5–15.5)
WBC: 6.2 10*3/uL (ref 4.0–10.5)

## 2017-02-16 LAB — CSF CELL COUNT WITH DIFFERENTIAL
Eosinophils, CSF: 0 % (ref 0–1)
Lymphs, CSF: 11 % — ABNORMAL LOW (ref 40–80)
MONOCYTE-MACROPHAGE-SPINAL FLUID: 8 % — AB (ref 15–45)
RBC COUNT CSF: 3 /mm3 — AB
SEGMENTED NEUTROPHILS-CSF: 81 % — AB (ref 0–6)
TUBE #: 1
WBC CSF: 16 /mm3 — AB (ref 0–5)

## 2017-02-16 LAB — MRSA PCR SCREENING: MRSA by PCR: NEGATIVE

## 2017-02-16 LAB — PROTEIN, CSF: TOTAL PROTEIN, CSF: 57 mg/dL — AB (ref 15–45)

## 2017-02-16 LAB — HEMOGLOBIN AND HEMATOCRIT, BLOOD
HCT: 24 % — ABNORMAL LOW (ref 39.0–52.0)
Hemoglobin: 7.5 g/dL — ABNORMAL LOW (ref 13.0–17.0)

## 2017-02-16 LAB — PROCALCITONIN: Procalcitonin: 1.01 ng/mL

## 2017-02-16 LAB — PREPARE RBC (CROSSMATCH)

## 2017-02-16 LAB — GLUCOSE, CAPILLARY: GLUCOSE-CAPILLARY: 84 mg/dL (ref 65–99)

## 2017-02-16 LAB — GLUCOSE, CSF: GLUCOSE CSF: 53 mg/dL (ref 40–70)

## 2017-02-16 MED ORDER — SODIUM CHLORIDE 0.9 % IV SOLN
Freq: Once | INTRAVENOUS | Status: AC
  Administered 2017-02-18: 22:00:00 via INTRAVENOUS

## 2017-02-16 MED ORDER — LIDOCAINE HCL (PF) 1 % IJ SOLN
INTRAMUSCULAR | Status: AC
  Filled 2017-02-16: qty 5

## 2017-02-16 MED ORDER — POLYVINYL ALCOHOL 1.4 % OP SOLN
1.0000 [drp] | OPHTHALMIC | Status: DC | PRN
  Administered 2017-02-16 – 2017-02-27 (×5): 1 [drp] via OPHTHALMIC
  Filled 2017-02-16: qty 15

## 2017-02-16 MED ORDER — ADULT MULTIVITAMIN W/MINERALS CH
1.0000 | ORAL_TABLET | Freq: Every day | ORAL | Status: DC
  Administered 2017-02-17 – 2017-03-07 (×18): 1 via ORAL
  Filled 2017-02-16 (×19): qty 1

## 2017-02-16 MED ORDER — FLUCONAZOLE IN SODIUM CHLORIDE 400-0.9 MG/200ML-% IV SOLN
400.0000 mg | INTRAVENOUS | Status: DC
  Administered 2017-02-16 – 2017-03-02 (×15): 400 mg via INTRAVENOUS
  Filled 2017-02-16 (×17): qty 200

## 2017-02-16 MED ORDER — PRO-STAT SUGAR FREE PO LIQD
30.0000 mL | Freq: Two times a day (BID) | ORAL | Status: DC
  Administered 2017-02-16 – 2017-03-06 (×36): 30 mL via ORAL
  Filled 2017-02-16 (×34): qty 30

## 2017-02-16 MED ORDER — HYPROMELLOSE (GONIOSCOPIC) 2.5 % OP SOLN
1.0000 [drp] | OPHTHALMIC | Status: DC | PRN
  Filled 2017-02-16: qty 15

## 2017-02-16 MED ORDER — SODIUM CHLORIDE 0.9 % IV BOLUS (SEPSIS)
1000.0000 mL | Freq: Once | INTRAVENOUS | Status: AC
  Administered 2017-02-16: 1000 mL via INTRAVENOUS

## 2017-02-16 NOTE — Care Management Note (Addendum)
Case Management Note  Patient Details  Name: William Pena MRN: [id number] Date of Birth: [birth year]  Subjective/Objective:   From [facility] SN[facility],has been there for a week pta. Presents with Encephalopathy, AKI,  has hx of 042, cryptococcal meningoencephalitis, perirectal abscess, recent CVA.    7/31 [name], BSN - Sepsis with recurrent cryptococcal menigeal encephalitis, 042, severe pcm, repeat lp thsi week Wed or Thurs, conts on ivf', iv abx, palliative following- mother leaning towards comfort care and father leaning towards aggressive care. Mother to go to court today for guardianship hearing.  Discussed in LOS 7/31.               Action/Plan: NCM will follow along with CSW for dc needs.   Expected Discharge Date:                  Expected Discharge Plan:  Skilled Nursing Facility  In-House Referral:  Clinical Social Work  Discharge planning Services  CM Consult  Post Acute Care Choice:    Choice offered to:     DME Arranged:    DME Agency:     HH Arranged:    [place] Agency:     Status of Service:  Completed, signed off  If discussed at [employer], dates discussed:    Additional Comments:  Zenon Mayo, RN 02/16/2017, 2:23 PM

## 2017-02-16 NOTE — Procedures (Signed)
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 One channel was dedicated to EKG recording.    Background: There is a posterior dominant rhythm of 8 Hz that is moderately well sustained. In addition, there is diffuse irregular low voltage delta and theta activity throughout the recording. Sleep is briefly recorded with symmetric structures.  Photic stimulation: Physiologic driving is not performed  EEG Abnormalities: 1) generalized irregular slow activity  Clinical Interpretation: This EEG is consistent with a generalized nonspecific cerebral dysfunction (encephalopathy). There was no seizure or seizure predisposition recorded on this study. Please note that a normal EEG does not preclude the possibility of epilepsy.   Roland Rack, MD [facility] [phone]  If 7pm- 7am, please page neurology on call as listed in [place].

## 2017-02-16 NOTE — Progress Notes (Signed)
Pt's pulse is sustaining in the 130's. [facility] MD/NP paged.

## 2017-02-16 NOTE — Clinical Social Work Note (Signed)
Clinical Social Work Assessment  Patient Details  Name: William Pena MRN: [id number] Date of Birth: [birth year]  Date of referral:  02/16/17               Reason for consult:  Facility Placement, Discharge Planning                Permission sought to share information with:  Facility Sport and exercise psychologist, Family Supports Permission granted to share information::  Yes, Verbal Permission Granted  Name::     Music therapist::  SNF  Relationship::  Mother  Contact Information:     Housing/Transportation Living arrangements for the past 2 months:  [place], [place] of Information:  Parent Patient Interpreter Needed:  None Criminal Activity/Legal Involvement Pertinent to Current Situation/Hospitalization:  No - Comment as needed Significant Relationships:  Parents Lives with:  Self, Facility Resident Do you feel safe going back to the place where you live?  Yes Need for family participation in patient care:  Yes (Comment) (patient nonverbal)  Care giving concerns:  Patient has been at [facility] for rehab for about a week prior to hospitalization; mother reports no concerns with care received there.   Social Worker assessment / plan:  CSW introduced self to patient's mother at bedside and discussed role. CSW confirmed with patient's mother that the patient had been receiving rehab at [facility], and that the patient would be returning when medically ready. Patient's mother acknowledged that she liked the care that the patient was receiving, and they had been getting him up with physical therapy, but that she didn't fully know what the plan was for when the patient would be medically ready. Patient's mother discussed that she really didn't know what was going on with the patient medically, and was waiting on the doctors to give her more information and make recommendations for what he needed at discharge. Patient's mother indicated that she would be  willing to return to [place] if the patient needed continued rehab services at discharge. CSW to complete updated FL2 and follow to facilitate discharge when medically ready.  Employment status:  Unemployed Forensic scientist:  Self Pay (Medicaid Pending) PT Recommendations:  Not assessed at this time Information / Referral to community resources:     Patient/Family's Response to care:  Patient's mother agreeable to return to [facility].  Patient/Family's Understanding of and Emotional Response to Diagnosis, Current Treatment, and Prognosis:  Patient's mother appeared overwhelmed and tearful during discussion. Patient's mother discussed how she had been appreciative of the care that the patient was receiving at [facility], and showed CSW pictures of him working with the therapists prior to hospitalization. Patient's mother discussed that the patient would be transferred to a different floor, and that she would follow up with CSW on the new medical floor about what to do with his care post-discharge. Patient's mother indicated understanding of CSW role in discharge planning.  Emotional Assessment Appearance:  Appears stated age Attitude/Demeanor/Rapport:  Unable to Assess Affect (typically observed):  Unable to Assess Orientation:   (unable to assess) Alcohol / Substance use:  Not Applicable Psych involvement (Current and /or in the community):  No (Comment)  Discharge Needs  Concerns to be addressed:  Care Coordination, Discharge Planning Concerns Readmission within the last 30 days:  No Current discharge risk:  Physical Impairment, Cognitively Impaired Barriers to Discharge:  Continued Medical Work up   [employer], LCSW 02/16/2017, 1:20 PM

## 2017-02-16 NOTE — Progress Notes (Addendum)
Patient current vitals 102.9 orally, BP 100/49, HR 129,  RR 28, 92 on 2.5, Patient responds to stimulation, pt is non verbal at baseline, MD page notified. Mother at the bedside.

## 2017-02-16 NOTE — Progress Notes (Signed)
Initial Nutrition Assessment  DOCUMENTATION CODES:   Not applicable  INTERVENTION:    Continue nsure Enlive po BID, each supplement provides 350 kcal and 20 grams of protein   Prostat liquid protein po 30 ml BID with meals, each supplement provides 100 kcal, 15 grams protein   Multivitamin with minerals daily   NUTRITION DIAGNOSIS:   Increased nutrient needs related to wound healing, chronic illness as evidenced by estimated needs  GOAL:   Patient will meet greater than or equal to 90% of their needs  MONITOR:   PO intake, Supplement acceptance, Labs, Weight trends, Skin, I & O's  REASON FOR ASSESSMENT:   Low Braden  ASSESSMENT:   31 yo Male with PMH of AIDS, recent admission for perirectal abscess, development of cryptococcal meningoencephalitis, recent CVA,  ARF, severe protein calorie malnutrition who presents to the ED from facility with increased mental status change. Patient was in usual state of health. On recent discharge (dated 7/9), patient was recommended by ID to continue fluconazole x 4 more weeks before resuming antiviral meds for HIV. Patient is non-verbal at baseline. Patient's mother reported patient's poor PO intake recently.  Pt in process of prepping for EEG upon visit. Seen per Clinical Nutrition during recent hospitalization beginning of July 2018. Pt with hx of variable PO intake. Mom assists with feeding. Likes Ensure supplements. Medications reviewed. Has Ensure Enlive and Prostat liquid protein ordered. Labs reviewed. BUN 46 (H). Cr 1.54 (H).  Unable to complete Nutrition-Focused physical exam at this time, however, suspect malnutrition ongoing.  Diet Order:  Diet regular Room service appropriate? Yes; Fluid consistency: Thin  Skin:    3 full thickness wounds to bilat buttocks DTI Left heel DTI Right heel   Last BM:  7/19  Height:   Ht Readings from Last 1 Encounters:  02/07/17 5\' 6"  (1.676 m)    Weight:   Wt Readings from Last 1  Encounters:  02/16/17 125 lb 12.8 oz (57.1 kg)    Ideal Body Weight:  75.4 kg  BMI:  Body mass index is 20.3 kg/m.  Estimated Nutritional Needs:   Kcal:  1800-2000  Protein:  90-110 gm  Fluid:  1.8-2.0 L  EDUCATION NEEDS:   No education needs identified at this time  Arthur Holms, RD, LDN Pager #: [phone] After-Hours Pager #: [phone]

## 2017-02-16 NOTE — Progress Notes (Signed)
1 Unit of blood was started Per MD order. Pt taken to IR for lumbar puncture. Pt accompanied by SWOT RN

## 2017-02-16 NOTE — Progress Notes (Signed)
Bedside EEG completed, results pending. 

## 2017-02-16 NOTE — NC FL2 (Signed)
[place] LEVEL OF CARE SCREENING TOOL     IDENTIFICATION  Patient Name: William Pena Birthdate: 04-30-1986 Sex: male Admission Date (Current Location): 02/15/2017  [facility] [place] Number:  Herbalist and Address:  [facility] [place]. [facility], [place] [address] [place], [place] [address]      Provider Number: [id number]  Attending Physician Name and Address:  [name], MD  Relative Name and Phone Number:       Current Level of Care: Hospital Recommended Level of Care: [place] Prior Approval Number:    Date Approved/Denied:   PASRR Number: [id number]  Discharge Plan: SNF    Current Diagnoses: Patient Active Problem List   Diagnosis Date Noted  . Acute encephalopathy 02/15/2017  . Acute urinary retention 02/08/2017  . Genital herpes 02/08/2017  . Anemia of chronic disease 02/08/2017  . Constipation 02/08/2017  . Protein-calorie malnutrition, severe 01/28/2017  . Pressure injury of skin 01/16/2017  . AKI (acute kidney injury) ([place])   . HIV (human immunodeficiency virus infection) ([place])   . Meningitis   . 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 Acute blood loss anemia   . Elevated intracranial pressure   . HIV disease ([place])   . Lymphadenopathy of head and neck   . Rectal abscess   . Cryptococcal meningoencephalitis ([place])   . 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 Cavitary pneumonia   . Diffuse lymphadenopathy   . Drug rash   . Hypomagnesemia 12/23/2016  . Encephalopathy acute 12/23/2016  . SIRS (systemic inflammatory response syndrome) ([place]) 12/23/2016  . Acute respiratory failure ([place]) 12/23/2016  . Pulmonary infiltrates   . Perirectal abscess 12/21/2016  . Hyponatremia 12/21/2016  . Normocytic anemia 12/21/2016  . Elevated LFTs 12/21/2016  . AIDS (acquired immune deficiency syndrome) ([place]) 07/07/2016  . Chronic pain 06/20/2011    Orientation RESPIRATION BLADDER Height & Weight      (unable to assess;  patient nonverbal)  O2 ([place] 2L) Incontinent, External catheter Weight:   Height:     BEHAVIORAL SYMPTOMS/MOOD NEUROLOGICAL BOWEL NUTRITION STATUS      Incontinent    AMBULATORY STATUS COMMUNICATION OF NEEDS Skin   Extensive Assist Non-Verbally Normal                       Personal Care Assistance Level of Assistance  Bathing, Dressing, Feeding Bathing Assistance: Maximum assistance Feeding assistance: Limited assistance Dressing Assistance: Maximum assistance     Functional Limitations Info  Speech     Speech Info: Impaired    SPECIAL CARE FACTORS FREQUENCY  PT (By licensed PT), OT (By licensed OT)     PT Frequency: 5x/wk OT Frequency: 5x/wk            Contractures      Additional Factors Info  Code Status, Allergies Code Status Info: full Allergies Info: Carrot Oil, Augmentin Amoxicillin-pot Clavulanate           Current Medications (02/16/2017):  This is the current hospital active medication list Current Facility-Administered Medications  Medication Dose Route Frequency Provider Last Rate Last Dose  . 0.9 %  sodium chloride infusion   Intravenous Continuous Rai, Ripudeep K, MD 100 mL/hr at 02/15/17 2236    . 0.9 %  sodium chloride infusion   Intravenous Once Rai, Ripudeep K, MD      . acetaminophen (TYLENOL) tablet 650 mg  650 mg Oral Q6H PRN Donne Hazel, MD  Or  . acetaminophen (TYLENOL) suppository 650 mg  650 mg Rectal Q6H PRN Donne Hazel, MD   650 mg at 02/16/17 1052  . amantadine (SYMMETREL) 50 MG/5ML solution 100 mg  100 mg Oral BID WC Donne Hazel, MD   100 mg at 02/16/17 1040  . [START ON 02/17/2017] azithromycin (ZITHROMAX) tablet 1,200 mg  1,200 mg Oral Q Fri Chiu, Stephen K, MD      . feeding supplement (ENSURE ENLIVE) (ENSURE ENLIVE) liquid 237 mL  237 mL Oral BID BM Donne Hazel, MD      . feeding supplement (PRO-STAT SUGAR FREE 64) liquid 30 mL  30 mL Oral QHS Donne Hazel, MD   30 mL at 02/15/17 2235  . fluconazole  (DIFLUCAN) IVPB 400 mg  400 mg Intravenous Q24H Lorella Nimrod, MD 100 mL/hr at 02/16/17 1139 400 mg at 02/16/17 1139  . HYDROcodone-acetaminophen (NORCO/VICODIN) 5-325 MG per tablet 1 tablet  1 tablet Oral Q6H PRN Donne Hazel, MD      . lidocaine (PF) (XYLOCAINE) 1 % injection           . nystatin (MYCOSTATIN) 100000 UNIT/ML suspension 500,000 Units  5 mL Oral QID Donne Hazel, MD   500,000 Units at 02/15/17 2236  . polyethylene glycol (MIRALAX / GLYCOLAX) packet 17 g  17 g Oral Daily PRN Donne Hazel, MD      . sulfamethoxazole-trimethoprim (BACTRIM DS,SEPTRA DS) 800-160 MG per tablet 1 tablet  1 tablet Oral Daily Donne Hazel, MD      . tamsulosin [facility]) capsule 0.4 mg  0.4 mg Oral Daily Donne Hazel, MD         Discharge Medications: Please see discharge summary for a list of discharge medications.  Relevant Imaging Results:  Relevant Lab Results:   Additional Information SS#: [id number]  Geralynn Ochs, LCSW

## 2017-02-16 NOTE — Progress Notes (Signed)
1000 Ml bolus given per MD order.

## 2017-02-16 NOTE — Consult Note (Signed)
Neurology Consultation Reason for Consult: Cryptococcal meningitis Referring Physician: Rai, R  CC: decreased responsiveness.   History is obtained from:Mother, chart  HPI: Tyray Proch is a 31 y.o. male with a history of critical meningitis who is treated and discharged about 10 days ago. He was treated with lumbar drain will he was in the hospital due to persistently elevated ICPs. This was able to be weaned and discontinued. Mental status is persistently been fairly poor, but his mother reports that he had improved to the point of being able to say a couple of words. He has since had a worsening mental status and therefore was brought back into the emergency department. Here, he was noted to be febrile.  ROS: Unable to obtain due to altered mental status.   Past Medical History:  Diagnosis Date  . Acute respiratory failure ([place]) 12/23/2016  . AIDS (acquired immune deficiency syndrome) ([place]) 07/07/2016  . AKI (acute kidney injury) ([place])   . Cavitary pneumonia   . Cryptococcal meningoencephalitis ([place])   . Diffuse lymphadenopathy   . 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 Drug rash   . Elevated intracranial pressure   . Elevated LFTs 12/21/2016  . 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 Encephalopathy acute 12/23/2016  . HIV (human immunodeficiency virus infection) ([place])   . HIV disease ([place])   . Hypomagnesemia 12/23/2016  . Hyponatremia 12/21/2016  . Lymphadenopathy of head and neck   . Meningitis   . Normocytic anemia 12/21/2016  . Perirectal abscess   . Protein-calorie malnutrition, severe 01/28/2017  . Pulmonary infiltrates   . SIRS (systemic inflammatory response syndrome) ([place]) 12/23/2016     Family History  Problem Relation Age of Onset  . Diabetes Other      Social History:  reports that he has quit smoking. His smoking use included Cigarettes. He smoked 0.33 packs per day. He has never used smokeless tobacco. He reports that he does not drink alcohol or use  drugs.   Exam: Current vital signs: BP (!) 100/49 (BP Location: Left Arm)   Pulse (!) 129   Temp (!) 102.9 F (39.4 C)   Resp (!) 28   SpO2 92%  Vital signs in last 24 hours: Temp:  [97.6 F (36.4 C)-102.9 F (39.4 C)] 102.9 F (39.4 C) (07/19 1042) Pulse Rate:  [79-135] 129 (07/19 1042) Resp:  [16-28] 28 (07/19 1042) BP: (92-114)/(49-85) 100/49 (07/19 1042) SpO2:  [92 %-100 %] 92 % (07/19 1042)   Physical Exam  Constitutional: Appears thin Psych: Does not answer questions Eyes: No scleral injection HENT: No OP obstrucion Head: Normocephalic.  Cardiovascular: Normal rate and regular rhythm.  Respiratory: Effort normal and breath sounds normal to anterior ascultation GI: Soft.  No distension. There is no tenderness.  Skin: WDI  Neuro: Mental Status: Patient is awake, alert, he does fixate tracks towards the left, does not follow commands. Cranial Nerves: II: Does not blink to threat from the right. Right pupil is slightly(~1 mm) larger than the left, both are briskly reactive III,IV, VI: He does not clearly if cross midline to the right V: He does respond to situation on both sides of his face VII: Facial movement is weak on the left Motor: He has a right spastic paresis, mild increased tone in the left, he does withdraw all extremities to noxious stimulation Sensory: Response to stimulation 4 Cerebellar: Does not comply  I have reviewed labs in epic and the results pertinent to this consultation are: CMP-unremarkable  I have reviewed the  images obtained: CT head-new hypodensity.  Impression: 31 year old male with cryptococcal meningitis who presents with worsening mental status and fevers. No any cause fevers could cause worsening of his mental status, I am certainly concerned that he may have persistent cryptococcal meningitis.  Even if the new hypodensity on CT does represent a new stroke, treatment would be treatment of his underlying  meningitis.  Recommendations: 1) agree with lumbar puncture to assess for recurrent cryptococcal meningitis 2) treatment of cryptococcal meningitis per infectious disease  Roland Rack, MD [facility] [phone]  If 7pm- 7am, please page neurology on call as listed in [place].

## 2017-02-16 NOTE — Consult Note (Signed)
ATTENDING NOTE: I have seen and examined the patient and reviewed the appropriate data.  He comes back with altered mental status and findings on CT concerning for CVA.  Since admission he has had an LP which is concerning with an opening pressure of 44 cm H2O.  There is still yeast on the gram stain and 16 WBCs.   PE: Opens eyes, nad Cardiovascular: tachy RR Pulmonary: CTA B  A/P 1) cryptococcal meningitis - This would not be IRIS since he has not yet started ARVs so I am concerned of relapse infection.  He has been on treatment doses of fluconazole 400 mg and his opening pressure is highly elevated but interestingly his CSF glucose is wnl and only 16 WBCs in the CSF.   For now I will continue 400 mg fluconazole I will consider reinduction if his culture is positive  2) increased ICP - he will need a repeat LP until his opening pressure normalizes  3) HIV - I will hold his azithromycin and Bactrim prophylaxis for now   [facility]    Date of Admission:  02/15/2017    Total days of antibiotics 59          more than 30 days of fluconazole On azithromycin and Bactrim prophylaxis.             Reason for Consult: Altered mental status with and controlled HIV and recent history of cryptococcal meningoencephalitis.    Referring Provider: Estill Cotta MD Primary Care Provider: Gaylyn Lambert  Assessment: Encephalopathy. Patient with uncontrolled HIV, last CD count of 21 with viral load about 1 million and recent cryptococcal meningoencephalitis with disseminated infection presented with worsening encephalopathy. He was tachycardic, hypotensive and spiking fever. CT head with worsening existing lesions with a new lesion in  right globus pallidus. There is a concern worsening of his cryptococcal meningoencephalitis. He was placed on multiple antibiotics which was stopped. He does need an LP with fungus culture and opening pressure along with routine  labs.  Plan: 1. LP with fungus culture and opening pressure and routine Gram stain and culture. 2. Continue azithromycin and Bactrim prophylaxis. 3. Continue fluconazole 400 mg, oral was converted to IV because of his current mental status. 4. He will need induction therapy if opening pressure found to be elevated or culture becomes positive again.  Principal Problem:   AKI (acute kidney injury) ([place]) Active Problems:   AIDS (acquired immune deficiency syndrome) (HCC)   Encephalopathy acute   Cryptococcal meningoencephalitis (HCC)   HIV (human immunodeficiency virus infection) ([place])   Protein-calorie malnutrition, severe   Anemia of chronic disease   Acute encephalopathy   . amantadine  100 mg Oral BID WC  . [START ON 02/17/2017] azithromycin  1,200 mg Oral Q Fri  . feeding supplement (ENSURE ENLIVE)  237 mL Oral BID BM  . feeding supplement (PRO-STAT SUGAR FREE 64)  30 mL Oral QHS  . fluconazole  400 mg Oral Daily  . nystatin  5 mL Oral QID  . sulfamethoxazole-trimethoprim  1 tablet Oral Daily  . tamsulosin  0.4 mg Oral Daily    HPI: William Pena is a 31 y.o. male with PMHx of uncontrolled HIV,recent admission for perirectal abscess, development of cryptococcal meningoencephalitis, recent CVA, ARF, severe protein calorie malnutrition who presents to the ED from facility with increased mental status change.  He was recently admitted from 12/20/2016 till 70/96/2836, a  Complicated hospital course due to disseminated cryptococcal infection  and cryptococcal meningoencephalitis. Patient hasn't history of being incarcerated, intermittent noncompliance with his HIV medication, he was restarted on treatment 2 weeks prior to previous admission. His HIV meds were stopped a cause of cryptococcal meningoencephalitis. He did completed 20 days of induction therapy with amphotericin and flucytosine. His hospital course was complicated with acute respiratory failure requiring intubation,  repeated LP for increased intracranial pressure and acute renal failure. He was discharged to skilled nursing facility apparently on fluconazole for 4 weeks, and was advised not to resume HIV meds. There was some confusion regarding his fluconazole and he might not get it for a few days. According to up-to-date guidelines he should continue on fluconazole until CD count raised above 200.  The patient was nonverbal with very minimum response, mostly history was obtained from mother who states that he becomes nonresponsive with very poor by mouth intake for last 2 days. Patient remained nonverbal since the time of his previous hospitalization.  Review of Systems: ROS  Past Medical History:  Diagnosis Date  . Acute respiratory failure ([place]) 12/23/2016  . AIDS (acquired immune deficiency syndrome) ([place]) 07/07/2016  . AKI (acute kidney injury) ([place])   . Cavitary pneumonia   . Cryptococcal meningoencephalitis ([place])   . Diffuse lymphadenopathy   . 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 Drug rash   . Elevated intracranial pressure   . Elevated LFTs 12/21/2016  . 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 Encephalopathy acute 12/23/2016  . HIV (human immunodeficiency virus infection) ([place])   . HIV disease ([place])   . Hypomagnesemia 12/23/2016  . Hyponatremia 12/21/2016  . Lymphadenopathy of head and neck   . Meningitis   . Normocytic anemia 12/21/2016  . Perirectal abscess   . Protein-calorie malnutrition, severe 01/28/2017  . Pulmonary infiltrates   . SIRS (systemic inflammatory response syndrome) ([place]) 12/23/2016    Social History  Substance Use Topics  . Smoking status: Former Smoker    Packs/day: 0.33    Types: Cigarettes  . Smokeless tobacco: Never Used  . Alcohol use No    Family History  Problem Relation Age of Onset  . Diabetes Other    Allergies  Allergen Reactions  . Carrot Oil Swelling    "throat swell"   . Augmentin [Amoxicillin-Pot Clavulanate] Rash    "break out"     OBJECTIVE: Blood pressure (!) 97/54, pulse (!) 134, temperature 98.7 F (37.1 C), temperature source Oral, resp. rate 20, SpO2 100 %.  Physical Exam  Vitals:   02/16/17 0816 02/16/17 0923 02/16/17 1042 02/16/17 1233  BP:  (!) 97/54 (!) 100/49 114/60  Pulse:  (!) 134 (!) 129 (!) 115  Resp:  20 (!) 28 (!) 26  Temp: 97.6 F (36.4 C) 98.7 F (37.1 C) (!) 102.9 F (39.4 C) 98 F (36.7 C)  TempSrc:  Oral  Axillary  SpO2:  100% 92% 100%   General: Vital signs reviewed. Patient was nonverbal with very minimum response to painful stimuli, not following any commands. Cardiovascular: Tachycardic, S1 normal, S2 normal, no murmurs, gallops, or rubs. Pulmonary/Chest: Clear to auscultation bilaterally, no wheezes, rales, or rhonchi. Abdominal: Soft, non-tender, non-distended, BS +, no masses, organomegaly, or guarding present.  Extremities: No lower extremity edema bilaterally,  pulses symmetric and intact bilaterally. No cyanosis or clubbing.  Lab Results Lab Results  Component Value Date   WBC 6.2 02/16/2017   HGB 7.1 (L) 02/16/2017   HCT 22.3 (L) 02/16/2017   MCV 87.8 02/16/2017   PLT 136 (L) 02/16/2017  Lab Results  Component Value Date   CREATININE 1.54 (H) 02/16/2017   BUN 46 (H) 02/16/2017   NA 138 02/16/2017   K 4.3 02/16/2017   CL 115 (H) 02/16/2017   CO2 14 (L) 02/16/2017    Lab Results  Component Value Date   ALT 31 02/16/2017   AST 35 02/16/2017   ALKPHOS 94 02/16/2017   BILITOT 0.6 02/16/2017     Microbiology: Recent Results (from the past 240 hour(s))  Culture, blood (Routine x 2)     Status: None (Preliminary result)   Collection Time: 02/15/17 12:45 PM  Result Value Ref Range Status   Specimen Description BLOOD LEFT ANTECUBITAL  Final   Special Requests   Final    BOTTLES DRAWN AEROBIC ONLY Blood Culture adequate volume   Culture NO GROWTH < 24 HOURS  Final   Report Status PENDING  Incomplete  Culture, blood (Routine x 2)     Status: None  (Preliminary result)   Collection Time: 02/15/17 12:51 PM  Result Value Ref Range Status   Specimen Description BLOOD RIGHT ANTECUBITAL  Final   Special Requests IN PEDIATRIC BOTTLE Blood Culture adequate volume  Final   Culture NO GROWTH < 24 HOURS  Final   Report Status PENDING  Incomplete    Lorella Nimrod, MD [facility] [phone] pager   [phone] cell 02/16/2017, 10:16 AM

## 2017-02-16 NOTE — Progress Notes (Signed)
[facility] MD/NP returned call- no further orders given at this time.

## 2017-02-16 NOTE — Progress Notes (Signed)
CRITICAL VALUE ALERT  Critical Value:  [name]  Date & Time NotiedLinus Salmons, MD  Provider Notified: [id number] 7/19  Orders Received/Actions taken: will await new orders

## 2017-02-16 NOTE — Consult Note (Addendum)
[place] Nurse wound consult note Reason for Consult: Consult requested for buttocks and bilat heels.  Pt had I&D of buttock abscesses in May with the surgical team, according to the EMR.  These wounds were present on admission but are NOT pressure injuries. Wound type: 3 full thickness wounds to bilat buttocks; 2X.5X.5cm, 1X1X1cm, 5.5X3X.2cm.  All sites are red with small amt tan drainage, no odor or fluctuance. Left heel with dark purple fluid filled blister; Deep tissue injury 5X8cm Right heel with dark purple fluid filled blister; Deep tissue injury .5X1cm Pressure Injury POA: Yes Dressing procedure/placement/frequency: Float heels in Prevalon boots to reduce pressure.  Air mattress to reduce pressure to buttocks.  Pack narrow buttock wounds Q day with Iodoform packing, and other buttock wound with moist fluffed gauze.  Pt is frequently incontinent of stool and it will be difficult to keep wounds from becoming soiled related to close proximity to the rectum.  No family members present to discuss plan of care. Please re-consult if further assistance is needed.  Thank-you,  Julien Girt MSN, [place], [place], [place], [place]

## 2017-02-16 NOTE — Progress Notes (Signed)
Report called in to 38M ; Anderson Malta RN took report. Mother and patient updated.

## 2017-02-16 NOTE — Progress Notes (Signed)
[facility]                                                                              Patient Demographics  William Pena, is a 31 y.o. male, DOB - October 06, 1985, GMW:[id number]  Admit date - 02/15/2017   Admitting Physician Donne Hazel, MD  Outpatient Primary MD for the patient is [name], [name], MD  Outpatient specialists:   LOS - 1  days   Medical records reviewed and are as summarized below:    Chief Complaint  Patient presents with  . Altered Mental Status       Brief summary   Patient is a 31 year old male with AIDS, recent admission for perirectal abscess, development of cryptococcal meningoencephalitis, recent CVA,  ARF, severe protein calorie malnutrition who presents to the ED from facility with increased mental status change. Patient was in usual state of health. On recent discharge (dated 7/9), patient was recommended by ID to continue fluconazole x 4 more weeks before resuming antiviral meds for HIV. Patient is non-verbal at baseline. Patient's mother reported patient's poor PO intake recently.   Assessment & Plan    Principal Problem:   Sepsis ([place]) - Currently spiking temp of 102.9, respiratory rate of 28, and tachycardiac and 120s to 130s, BP borderline - Follow blood cultures, discussed with infectious disease, Dr. [name], recommended lumbar puncture to rule out cryptococcal meningitis given his recent history of cryptococcal meningitis in the setting of AIDS - Placed on aggressive IV fluid hydration, follow blood cultures, per ID hold off on antibiotics for now - ID had recommended completing course of fluconazole now 3 more weeks before resuming antiviral regimen -Transfuse 1 unit of packed RBCs for Hb of 7.1  Active Problems:   AIDS (acquired immune deficiency syndrome) (HCC) - Currently lethargic, once patient is much more alert and awake, will restart antiretrovirals per ID recommendations    Encephalopathy acute - Currently close  to baseline, and discussed in detail with patient's mother at the bedside who states that since the previous admission patient has been nonverbal and has been bed bound mostly - CT head showed continued interval evolution of the previously identified bifrontal and deep gray nuclei infarcts related to 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 - Discussed with neurology, Dr. Leonel Ramsay recommended EEG, no need of MRI brain.     recent Cryptococcal meningoencephalitis ([place]), HIV (human immunodeficiency virus infection) ([place]) - ID consulted, will follow recommendations, plan for LP    AKI (acute kidney injury) ([place]) - Improving, continue IV fluid hydration    Protein-calorie malnutrition, severe -  will need nutrition consult    Anemia of chronic disease - Hemoglobin 7.1, baseline 7.5-8 - Transfuse 1 unit of packed RBCs    Code Status: Full CODE STATUS  DVT Prophylaxis: SCDs  Family Communication: Discussed in detail with the patient, all imaging results, lab results explained to the patient and mother  Disposition Plan  Time Spent in minutes 35 minutes  Procedures:    Consultants:   Infectious disease Neurology  Antimicrobials:  Medications  Scheduled Meds: . amantadine  100 mg Oral BID WC  . feeding supplement (ENSURE ENLIVE)  237 mL Oral BID BM  . feeding supplement (PRO-STAT SUGAR FREE 64)  30 mL Oral QHS  . lidocaine (PF)      . nystatin  5 mL Oral QID  . tamsulosin  0.4 mg Oral Daily   Continuous Infusions: . sodium chloride 100 mL/hr at 02/15/17 2236  . sodium chloride    . fluconazole (DIFLUCAN) IV 400 mg (02/16/17 1139)   PRN Meds:.acetaminophen **OR** acetaminophen, HYDROcodone-acetaminophen, polyethylene glycol   Antibiotics   Anti-infectives    Start     Dose/Rate Route Frequency Ordered Stop   02/17/17 1400   levofloxacin (LEVAQUIN) IVPB 750 mg  Status:  Discontinued     750 mg 100 mL/hr over 90 Minutes Intravenous Every 48 hours 02/15/17 1515 02/16/17 1043   02/17/17 1000  azithromycin (ZITHROMAX) tablet 1,200 mg  Status:  Discontinued     1,200 mg Oral Every Fri 02/15/17 1615 02/16/17 1308   02/16/17 1330  ceFEPIme (MAXIPIME) 2 g in dextrose 5 % 50 mL IVPB  Status:  Discontinued     2 g 100 mL/hr over 30 Minutes Intravenous Every 24 hours 02/15/17 1515 02/16/17 1043   02/16/17 1200  fluconazole (DIFLUCAN) IVPB 400 mg     400 mg 100 mL/hr over 120 Minutes Intravenous Every 24 hours 02/16/17 1048     02/16/17 1000  fluconazole (DIFLUCAN) tablet 400 mg  Status:  Discontinued     400 mg Oral Daily 02/15/17 1615 02/16/17 1048   02/16/17 1000  sulfamethoxazole-trimethoprim (BACTRIM DS,SEPTRA DS) 800-160 MG per tablet 1 tablet  Status:  Discontinued     1 tablet Oral Daily 02/15/17 1615 02/16/17 1308   02/16/17 0200  vancomycin (VANCOCIN) 500 mg in sodium chloride 0.9 % 100 mL IVPB  Status:  Discontinued     500 mg 100 mL/hr over 60 Minutes Intravenous Every 12 hours 02/15/17 1515 02/16/17 1043   02/15/17 1315  ceFEPIme (MAXIPIME) 2 g in dextrose 5 % 50 mL IVPB     2 g 100 mL/hr over 30 Minutes Intravenous  Once 02/15/17 1307 02/15/17 1436   02/15/17 1300  levofloxacin (LEVAQUIN) IVPB 750 mg     750 mg 100 mL/hr over 90 Minutes Intravenous  Once 02/15/17 1254 02/15/17 1533   02/15/17 1300  aztreonam (AZACTAM) 2 g in dextrose 5 % 50 mL IVPB  Status:  Discontinued     2 g 100 mL/hr over 30 Minutes Intravenous  Once 02/15/17 1254 02/15/17 1307   02/15/17 1300  vancomycin (VANCOCIN) IVPB 1000 mg/200 mL premix     1,000 mg 200 mL/hr over 60 Minutes Intravenous  Once 02/15/17 1254 02/15/17 1436        Subjective:   William Pena was seen and examined today.  Appears to be comfortable, however spiking fevers, 102.73F  No nausea, vomiting or diarrhea. No chest pain, shortness of breath. No new  weakness or tingling.  Patient denies dizziness, chest pain, shortness of breath, abdominal pain, N/V/D/C, new weakness, numbess, tingling. No acute events overnight.    Objective:   Vitals:   02/16/17 0816 02/16/17 0923 02/16/17 1042 02/16/17 1233  BP:  (!) 97/54 (!) 100/49 114/60  Pulse:  (!) 134 (!) 129 (!) 115  Resp:  20 (!) 28 (!) 26  Temp: 97.6 F (36.4 C) 98.7 F (37.1 C) (!) 102.9 F (39.4 C) 98 F (36.7 C)  TempSrc:  Oral  Axillary  SpO2:  100% 92% 100%    Intake/Output Summary (Last 24 hours) at 02/16/17 1313 Last data filed at 02/16/17 0926  Gross per 24 hour  Intake             1800 ml  Output             1400 ml  Net              400 ml     Wt Readings from Last 3 Encounters:  02/07/17 61.7 kg (136 lb)     Exam  General: Alert and Awake, tracking with eyes, nonverbal   Eyes:   HEENT:    Cardiovascular: S1 S2 auscultated, no rubs, murmurs or gallops. Regular rate and rhythm.  Respiratory: Clear to auscultation bilaterally, no wheezing, rales or rhonchi  Gastrointestinal: Soft, nontender, nondistended, + bowel sounds  Ext: no pedal edema bilaterally  Neuro: difficult to assess, does not follow commands, overall appears to be global weakness   Musculoskeletal: No digital cyanosis, clubbing  Skin: No rashes  Psych: Normal affect and demeanor, alert and oriented x3    Data Reviewed:  I have personally reviewed following labs and imaging studies  Micro Results Recent Results (from the past 240 hour(s))  Culture, blood (Routine x 2)     Status: None (Preliminary result)   Collection Time: 02/15/17 12:45 PM  Result Value Ref Range Status   Specimen Description BLOOD LEFT ANTECUBITAL  Final   Special Requests   Final    BOTTLES DRAWN AEROBIC ONLY Blood Culture adequate volume   Culture NO GROWTH < 24 HOURS  Final   Report Status PENDING  Incomplete  Culture, blood (Routine x 2)     Status: None (Preliminary result)   Collection Time: 02/15/17  12:51 PM  Result Value Ref Range Status   Specimen Description BLOOD RIGHT ANTECUBITAL  Final   Special Requests IN PEDIATRIC BOTTLE Blood Culture adequate volume  Final   Culture NO GROWTH < 24 HOURS  Final   Report Status PENDING  Incomplete    Radiology Reports Dg Chest 1 View  Result Date: 02/15/2017 CLINICAL DATA:  Respiratory failure.  HIV disease. EXAM: CHEST 1 VIEW COMPARISON:  January 08, 2017 FINDINGS: There is no edema or consolidation. Heart size and pulmonary vascularity are normal. No adenopathy. No evident bone lesions. IMPRESSION: No edema or consolidation. Electronically Signed   By: Lowella Grip III M.D.   On: 02/15/2017 13:30   Ct Head Wo Contrast  Result Date: 02/15/2017 CLINICAL DATA:  Initial evaluation for acute altered mental status, history of meningitis with small vessel infarcts. EXAM: CT HEAD WITHOUT CONTRAST TECHNIQUE: Contiguous axial images were obtained from the base of the skull through the vertex without intravenous contrast. COMPARISON:  Comparison made with recent CT from 01/06/2017 as well as brain MRI from 12/27/2016. FINDINGS: Brain: Stable cerebral volume. There has been interval evolution of previously identified infarctions related to meningitis, with progressive hypodensities seen at the level of the left caudate and lentiform nuclei, as well as the cortical gray matter and underlying subcortical/deep white matter of the bilateral frontal lobes near the vertex (series 3, image 24). These areas were seen to be involved on previous brain MRI. No associated mass effect or hemorrhagic transformation. There is a probable new insult with 1 cm hypodensity seen at the right globus pallidus (series 3, image 15), not definitely seen on previous. No associated hemorrhage or mass effect. No other acute large  vessel territory infarct. No acute intracranial hemorrhage. No mass lesion or midline shift. No mass effect. No hydrocephalus. No extra-axial fluid collection.  Vascular: No hyperdense vessel. Skull: Scalp soft tissues and calvarium within normal limits. Sinuses/Orbits: Globes and orbital soft tissues within normal limits. Mild scattered mucoperiosteal thickening within the maxillary sinuses and right sphenoid sinus. Visualized paranasal sinuses are otherwise clear. Mastoids and middle ear cavities are clear. IMPRESSION: 1. 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 New 1 cm hypodensity at the right globus pallidus suspicious for new/interval acute/subacute infarction. 2. No evidence for intracranial hemorrhage or other complication. Electronically Signed   By: Jeannine Boga M.D.   On: 02/15/2017 17:31   Dg Fluoro Guide Lumbar Puncture  Result Date: 01/30/2017 CLINICAL DATA:  HIV. EXAM: DIAGNOSTIC LUMBAR PUNCTURE UNDER FLUOROSCOPIC GUIDANCE FLUOROSCOPY TIME:  Fluoroscopy Time:  1 minutes and 12 seconds Radiation Exposure Index (if provided by the fluoroscopic device): Not applicable. Number of Acquired Spot Images: 1 PROCEDURE: Informed consent was obtained from the patient's mother prior to the procedure, including potential complications of headache, allergy, and pain. With the patient prone, the lower back was prepped with Betadine. 1% Lidocaine was used for local anesthesia. Lumbar puncture was performed at the L2-3 level using a 20 gauge needle with return of clear CSF with an opening pressure of 9 cm water. 3.5 ml of CSF were obtained for laboratory studies. The patient tolerated the procedure well and there were no apparent complications. Further fluid could not be obtained secondary to patient's clinical status and low pressure. Closing pressure was not performed secondary to low pressure throughout. IMPRESSION: Non complicated lumbar puncture as detailed above. Electronically Signed   By: Abigail Miyamoto M.D.   On: 01/30/2017  16:26   Dg Fluoro Guide Lumbar Puncture  Result Date: 01/25/2017 Etheleen Mayhew, MD     01/25/2017  1:46 PM Lumbar puncture performed at L2-L3.  Opening pressure 22 cm H20.  22 mL clear CSF obtained and sent to lab for testing.  Please see full dictation in PACS for additional details.    Lab Data:  CBC:  Recent Labs Lab 02/15/17 1232 02/16/17 0518  WBC 5.4 6.2  NEUTROABS 4.3  --   HGB 7.6* 7.1*  HCT 22.5* 22.3*  MCV 84.6 87.8  PLT 316 884*   Basic Metabolic Panel:  Recent Labs Lab 02/15/17 1413 02/16/17 0518  NA 134* 138  K 4.2 4.3  CL 109 115*  CO2 19* 14*  GLUCOSE 122* 87  BUN 50* 46*  CREATININE 2.09* 1.54*  CALCIUM 8.5* 8.4*   GFR: Estimated Creatinine Clearance: 60.7 mL/min (A) (by C-G formula based on SCr of 1.54 mg/dL (H)). Liver Function Tests:  Recent Labs Lab 02/16/17 0518  AST 35  ALT 31  ALKPHOS 94  BILITOT 0.6  PROT 7.6  ALBUMIN 2.0*   No results for input(s): LIPASE, AMYLASE in the last 168 hours. No results for input(s): AMMONIA in the last 168 hours. Coagulation Profile: No results for input(s): INR, PROTIME in the last 168 hours. Cardiac Enzymes: No results for input(s): CKTOTAL, CKMB, CKMBINDEX, TROPONINI in the last 168 hours. BNP (last 3 results) No results for input(s): PROBNP in the last 8760 hours. HbA1C: No results for input(s): HGBA1C in the last 72 hours. CBG: No results for input(s): GLUCAP in the last 168 hours. Lipid Profile: No results for input(s): CHOL, HDL, LDLCALC, TRIG,  CHOLHDL, LDLDIRECT in the last 72 hours. Thyroid Function Tests: No results for input(s): TSH, T4TOTAL, FREET4, T3FREE, THYROIDAB in the last 72 hours. Anemia Panel: No results for input(s): VITAMINB12, FOLATE, FERRITIN, TIBC, IRON, RETICCTPCT in the last 72 hours. Urine analysis:    Component Value Date/Time   COLORURINE YELLOW 02/15/2017 1342   APPEARANCEUR HAZY (A) 02/15/2017 1342   LABSPEC 1.021 02/15/2017 1342   PHURINE 6.0  02/15/2017 1342   GLUCOSEU NEGATIVE 02/15/2017 1342   HGBUR SMALL (A) 02/15/2017 1342   BILIRUBINUR NEGATIVE 02/15/2017 1342   KETONESUR NEGATIVE 02/15/2017 1342   PROTEINUR 30 (A) 02/15/2017 1342   NITRITE POSITIVE (A) 02/15/2017 1342   LEUKOCYTESUR TRACE (A) 02/15/2017 1342     Yanis Larin M.D. [facility] 02/16/2017, 1:13 PM  Pager: [phone] Between 7am to 7pm - call Pager - [phone]  After 7pm go to [email or web address] - password [email or web address]  Call night coverage person covering after 7pm

## 2017-02-17 LAB — FUNGUS CULTURE WITH STAIN

## 2017-02-17 LAB — DIC (DISSEMINATED INTRAVASCULAR COAGULATION) PANEL
APTT: 41 s — AB (ref 24–36)
D DIMER QUANT: 10.23 ug{FEU}/mL — AB (ref 0.00–0.50)
FIBRINOGEN: 266 mg/dL (ref 210–475)
INR: 1.2
PLATELETS: 113 10*3/uL — AB (ref 150–400)
PROTHROMBIN TIME: 15.2 s (ref 11.4–15.2)

## 2017-02-17 LAB — CBC
HCT: 23.6 % — ABNORMAL LOW (ref 39.0–52.0)
HEMOGLOBIN: 7.4 g/dL — AB (ref 13.0–17.0)
MCH: 27.7 pg (ref 26.0–34.0)
MCHC: 31.4 g/dL (ref 30.0–36.0)
MCV: 88.4 fL (ref 78.0–100.0)
Platelets: 109 10*3/uL — ABNORMAL LOW (ref 150–400)
RBC: 2.67 MIL/uL — AB (ref 4.22–5.81)
RDW: 16.4 % — ABNORMAL HIGH (ref 11.5–15.5)
WBC: 4.8 10*3/uL (ref 4.0–10.5)

## 2017-02-17 LAB — CSF CELL COUNT WITH DIFFERENTIAL
RBC Count, CSF: 4 /mm3 — ABNORMAL HIGH
TUBE #: 2
WBC, CSF: 2 /mm3 (ref 0–5)

## 2017-02-17 LAB — BASIC METABOLIC PANEL
ANION GAP: 8 (ref 5–15)
BUN: 35 mg/dL — AB (ref 6–20)
CHLORIDE: 118 mmol/L — AB (ref 101–111)
CO2: 15 mmol/L — ABNORMAL LOW (ref 22–32)
Calcium: 8.7 mg/dL — ABNORMAL LOW (ref 8.9–10.3)
Creatinine, Ser: 0.92 mg/dL (ref 0.61–1.24)
GFR calc Af Amer: >60 mL/min (ref >60)
Glucose, Bld: 90 mg/dL (ref 65–99)
POTASSIUM: 3.8 mmol/L (ref 3.5–5.1)
SODIUM: 141 mmol/L (ref 135–145)

## 2017-02-17 LAB — PATHOLOGIST SMEAR REVIEW: PATH REVIEW: INCREASED

## 2017-02-17 LAB — PROTEIN AND GLUCOSE, CSF
Glucose, CSF: 45 mg/dL (ref 40–70)
Total  Protein, CSF: 44 mg/dL (ref 15–45)

## 2017-02-17 LAB — FUNGUS CULTURE RESULT

## 2017-02-17 LAB — IRON AND TIBC
IRON: 48 ug/dL (ref 45–182)
SATURATION RATIOS: 45 % — AB (ref 17.9–39.5)
TIBC: 106 ug/dL — AB (ref 250–450)
UIBC: 58 ug/dL

## 2017-02-17 LAB — FERRITIN: Ferritin: 1433 ng/mL — ABNORMAL HIGH (ref 24–336)

## 2017-02-17 LAB — RETICULOCYTES
RBC.: 2.57 MIL/uL — ABNORMAL LOW (ref 4.22–5.81)
Retic Ct Pct: <0.4 % — ABNORMAL LOW (ref 0.4–3.1)

## 2017-02-17 LAB — FOLATE: FOLATE: 13.7 ng/mL (ref >5.9)

## 2017-02-17 LAB — DIC (DISSEMINATED INTRAVASCULAR COAGULATION)PANEL: Smear Review: NONE SEEN

## 2017-02-17 LAB — LACTATE DEHYDROGENASE: LDH: 205 U/L — ABNORMAL HIGH (ref 98–192)

## 2017-02-17 LAB — FUNGAL ORGANISM REFLEX

## 2017-02-17 LAB — VITAMIN B12: VITAMIN B 12: 638 pg/mL (ref 180–914)

## 2017-02-17 MED ORDER — LIDOCAINE HCL (PF) 1 % IJ SOLN
INTRAMUSCULAR | Status: AC
  Administered 2017-02-17: 10:00:00
  Filled 2017-02-17: qty 5

## 2017-02-17 NOTE — Progress Notes (Signed)
[facility] Infectious Disease  Date of Admission:  02/15/2017    Total days of antibiotics 60         More than 30 days of fluconazole.         Bactrim and azithromycin was stopped yesterday.  ASSESSMENT: Encephalopathy. LP with increased opening pressure yesterday, improved opening pressure today at 18.5 and closing pressure of 11. Called the [facility] rehabilitation center and according to them he was taking fluconazole daily. Gram stain is showing some yeast, culture results are pending. Clinically he remains the same with minimum response.  PLAN: 1. Continue fluconazole-we will start reinduction therapy if culture becomes positive.  Principal Problem:   Sepsis ([place]) Active Problems:   AIDS (acquired immune deficiency syndrome) (HCC)   Encephalopathy acute   Cryptococcal meningoencephalitis (HCC)   HIV (human immunodeficiency virus infection) ([place])   AKI (acute kidney injury) ([place])   Protein-calorie malnutrition, severe   Anemia of chronic disease   Acute encephalopathy   . amantadine  100 mg Oral BID WC  . feeding supplement (ENSURE ENLIVE)  237 mL Oral BID BM  . feeding supplement (PRO-STAT SUGAR FREE 64)  30 mL Oral BID  . multivitamin with minerals  1 tablet Oral Daily  . nystatin  5 mL Oral QID  . tamsulosin  0.4 mg Oral Daily    SUBJECTIVE: Patient was nonverbal, responding to painful stimuli and following some of simple commands.  Review of Systems: ROS  Allergies  Allergen Reactions  . Carrot Oil Swelling    "throat swell"   . Augmentin [Amoxicillin-Pot Clavulanate] Rash    "break out"    OBJECTIVE: Vitals:   02/16/17 1955 02/17/17 0030 02/17/17 0435 02/17/17 0700  BP: 114/65 100/65 101/61   Pulse: 100 81 80   Resp: 19 18 19    Temp: 98.1 F (36.7 C) 98.2 F (36.8 C) 98.1 F (36.7 C) (!) 97.3 F (36.3 C)  TempSrc: Oral Oral Oral Axillary  SpO2: 100% 99% 100% 98%  Weight:       Body mass index is 20.3 kg/m.  Physical  Exam   Gen. Well-developed, emaciated gentleman, with multiple tattoos, nonverbal, responding to painful stimuli and following some simple commands. Lungs. Clear bilaterally. CV. Tachycardia with a regular rhythm. Abdomen. Soft, nontender, bowel sounds positive.  Lab Results Lab Results  Component Value Date   WBC 4.8 02/17/2017   HGB 7.4 (L) 02/17/2017   HCT 23.6 (L) 02/17/2017   MCV 88.4 02/17/2017   PLT 109 (L) 02/17/2017    Lab Results  Component Value Date   CREATININE 0.92 02/17/2017   BUN 35 (H) 02/17/2017   NA 141 02/17/2017   K 3.8 02/17/2017   CL 118 (H) 02/17/2017   CO2 15 (L) 02/17/2017    Lab Results  Component Value Date   ALT 31 02/16/2017   AST 35 02/16/2017   ALKPHOS 94 02/16/2017   BILITOT 0.6 02/16/2017     Microbiology: Recent Results (from the past 240 hour(s))  Culture, blood (Routine x 2)     Status: None (Preliminary result)   Collection Time: 02/15/17 12:45 PM  Result Value Ref Range Status   Specimen Description BLOOD LEFT ANTECUBITAL  Final   Special Requests   Final    BOTTLES DRAWN AEROBIC ONLY Blood Culture adequate volume   Culture NO GROWTH 2 DAYS  Final   Report Status PENDING  Incomplete  Culture, blood (Routine x 2)  Status: None (Preliminary result)   Collection Time: 02/15/17 12:51 PM  Result Value Ref Range Status   Specimen Description BLOOD RIGHT ANTECUBITAL  Final   Special Requests IN PEDIATRIC BOTTLE Blood Culture adequate volume  Final   Culture NO GROWTH 2 DAYS  Final   Report Status PENDING  Incomplete  CSF culture     Status: None (Preliminary result)   Collection Time: 02/16/17  1:29 PM  Result Value Ref Range Status   Specimen Description CSF  Final   Special Requests NONE  Final   Gram Stain   Final    WBC PRESENT, PREDOMINANTLY PMN YEAST CYTOSPIN SMEAR CRITICAL RESULT CALLED TO, READ BACK BY AND VERIFIED WITH: Candie Chroman RN AT [id number] ON [id number] BY SJW    Culture NO GROWTH < 24 HOURS  Final   Report Status  PENDING  Incomplete  MRSA PCR Screening     Status: None   Collection Time: 02/16/17  2:44 PM  Result Value Ref Range Status   MRSA by PCR NEGATIVE NEGATIVE Final    Comment:        The GeneXpert MRSA Assay (FDA approved for NASAL specimens only), is one component of a comprehensive MRSA colonization surveillance program. It is not intended to diagnose MRSA infection nor to guide or monitor treatment for MRSA infections.     Lorella Nimrod, [name] [phone] pager   [phone] cell 02/17/2017, 10:52 AM

## 2017-02-17 NOTE — Progress Notes (Signed)
Subjective: No changes, ICP 44 yesterday.   Exam: Vitals:   02/17/17 0435 02/17/17 0700  BP: 101/61   Pulse: 80   Resp: 19   Temp: 98.1 F (36.7 C) (!) 97.3 F (36.3 C)   Gen: In bed, NAD Resp: non-labored breathing, no acute distress Abd: soft, nt  Neuro: MS: awake, alert, does not follow commands WP:VXYIA, Left gaze preference but does fixate and track on the left, just does not cross midline.  Motor: right spastic hemiparesis, moves left side purposefully, withdraws right.  Sensory:as above.   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 Despite transfusion, Hgb remains low.   Recommendations: 1) Anemia per IM 2) Daily LPs for ICP control, if persistently high, may need to consider EVD.  3) will follow  Roland Rack, MD [facility] [phone]  If 7pm- 7am, please page neurology on call as listed in [place].

## 2017-02-17 NOTE — Procedures (Addendum)
Indication: obtain CSF and obtain opening pressure/closing pressure  Risks of the procedure were dicussed with the patient including post-LP headache, bleeding, infection, weakness/numbness of legs(radiculopathy), death.  The patient's mother agreed and written consent was obtained.   The patient was prepped and draped, and using sterile technique a 20 gauge quinke spinal needle was inserted in the Lumbar 3/4 space. The opening pressure was 18.5 cm H20 and closing pressure was 11 cm H20. Approximately  12.5 cc of CSF were obtained and sent for analysis.   4 Attempts were made before CSF was obtained.   Etta Quill PA-C [facility] [phone]  M-F  (8:30 am- 4 PM)  02/17/2017, 10:38 AM    I was present throughout the procedure.   Roland Rack, MD [facility] [phone]  If 7pm- 7am, please page neurology on call as listed in [place].

## 2017-02-17 NOTE — Plan of Care (Signed)
Problem: Education: Goal: Knowledge of [facility] Education information/materials will improve Outcome: Progressing POC reviewed with pt./mother; pt. is nonverbal and not interactive.

## 2017-02-17 NOTE — Progress Notes (Addendum)
[facility] Infectious Disease   Reason for visit: Follow up on Cryptococcal meningitis  Interval History: LP done and opening pressure 18.5 cm H20, much improved; patient without much response; CSF culture without any growth so far.    Physical Exam: Constitutional:  Vitals:   02/17/17 0435 02/17/17 0700  BP: 101/61   Pulse: 80   Resp: 19   Temp: 98.1 F (36.7 C) (!) 97.3 F (36.3 C)   patient appears in nad Respiratory: Normal respiratory effort; CTA B Cardiovascular: tachy RR GI: soft, nt, nd Skin: no rashes  Review of Systems: Unable to be assessed due to mental status  Lab Results  Component Value Date   WBC 4.8 02/17/2017   HGB 7.4 (L) 02/17/2017   HCT 23.6 (L) 02/17/2017   MCV 88.4 02/17/2017   PLT 109 (L) 02/17/2017    Lab Results  Component Value Date   CREATININE 0.92 02/17/2017   BUN 35 (H) 02/17/2017   NA 141 02/17/2017   K 3.8 02/17/2017   CL 118 (H) 02/17/2017   CO2 15 (L) 02/17/2017    Lab Results  Component Value Date   ALT 31 02/16/2017   AST 35 02/16/2017   ALKPHOS 94 02/16/2017     Microbiology: Recent Results (from the past 240 hour(s))  Culture, blood (Routine x 2)     Status: None (Preliminary result)   Collection Time: 02/15/17 12:45 PM  Result Value Ref Range Status   Specimen Description BLOOD LEFT ANTECUBITAL  Final   Special Requests   Final    BOTTLES DRAWN AEROBIC ONLY Blood Culture adequate volume   Culture NO GROWTH 2 DAYS  Final   Report Status PENDING  Incomplete  Culture, blood (Routine x 2)     Status: None (Preliminary result)   Collection Time: 02/15/17 12:51 PM  Result Value Ref Range Status   Specimen Description BLOOD RIGHT ANTECUBITAL  Final   Special Requests IN PEDIATRIC BOTTLE Blood Culture adequate volume  Final   Culture NO GROWTH 2 DAYS  Final   Report Status PENDING  Incomplete  CSF culture     Status: None (Preliminary result)   Collection Time: 02/16/17  1:29 PM  Result Value Ref Range  Status   Specimen Description CSF  Final   Special Requests NONE  Final   Gram Stain   Final    WBC PRESENT, PREDOMINANTLY PMN YEAST CYTOSPIN SMEAR CRITICAL RESULT CALLED TO, READ BACK BY AND VERIFIED WITH: Candie Chroman RN AT [id number] ON [id number] BY SJW    Culture NO GROWTH < 24 HOURS  Final   Report Status PENDING  Incomplete  MRSA PCR Screening     Status: None   Collection Time: 02/16/17  2:44 PM  Result Value Ref Range Status   MRSA by PCR NEGATIVE NEGATIVE Final    Comment:        The GeneXpert MRSA Assay (FDA approved for NASAL specimens only), is one component of a comprehensive MRSA colonization surveillance program. It is not intended to diagnose MRSA infection nor to guide or monitor treatment for MRSA infections.     Impression/Plan:  1. Cryptococcal meningitis - improved opening pressure this am.  On fluconazole 400 mg.  I will continue fluconazole at this dose at least another 4 weeks  Will need to repeat the LP for opening pressure tomorrow and daily until it has normalized.  2. HIV - still holding on ARVs and I will likely defer starting another 4  weeks due to high opening pressure as in #1.    3.  Opportunistic infection prophylaxis - holding Bactrim and azithromycin for now.

## 2017-02-17 NOTE — Progress Notes (Signed)
[facility]                                                                              Patient Demographics  William Pena, is a 31 y.o. male, DOB - April 07, 1986, BTD:[id number]  Admit date - 02/15/2017   Admitting Physician Donne Hazel, MD  Outpatient Primary MD for the patient is [name], [name], MD  Outpatient specialists:   LOS - 2  days   Medical records reviewed and are as summarized below:    Chief Complaint  Patient presents with  . Altered Mental Status       Brief summary   Patient is a 31 year old male with AIDS, recent admission for perirectal abscess, development of cryptococcal meningoencephalitis, recent CVA,  ARF, severe protein calorie malnutrition who presents to the ED from facility with increased mental status change. Patient was in usual state of health. On recent discharge (dated 7/9), patient was recommended by ID to continue fluconazole x 4 more weeks before resuming antiviral meds for HIV. Patient is non-verbal at baseline. Patient's mother reported patient's poor PO intake recently.   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 Leonel Ramsay, patient will likely need daily LPs for ICP control, if persistently elevated, will need shunt -Started on IV fluconazole by ID    Active Problems:   AIDS (acquired immune deficiency syndrome) (H[facility]) - Currently lethargic, ID managing, will follow recommendations regarding antiretrovirals    Encephalopathy acute -  still encephalopathic, minimal response, likely due to #1.Marland Kitchen Per patient's mother, he has been nonverbal and mostly bedbound since the prior  admission - EEG on 7/19 showed generalized irregular slow activity/nonspecific   AKI (acute kidney injury) ([place]) - Improving, continue IV fluid hydration    Protein-calorie malnutrition, severe - Placed nutrition consult     acute on chronic anemia, thrombocytopenia   - Patient has underlying anemia of chronic disease. Hemoglobin 7.1 on 7/19, was transfused 1 unit packed RBC withcurrent improvement, hemoglobin 7.4 today. Baseline 7.5-8 - Obtain hemolysis panel, LDH, haptoglobin, DIC panel, reticulocyte count, anemia panel FOBT - Will transfuse another unit  Code Status: Full CODE STATUS  DVT Prophylaxis: SCDs  Family Communication: Discussed in detail with the patient, all imaging results, lab results explained to the patient and mother  Disposition Plan  Time Spent in minutes 35 minutes  Procedures:    Consultants:   Infectious disease Neurology  Antimicrobials:   Fluconazole 7/19   Medications  Scheduled Meds: . amantadine  100 mg Oral BID WC  . feeding supplement (ENSURE ENLIVE)  237 mL Oral BID BM  . feeding supplement (PRO-STAT SUGAR FREE 64)  30 mL Oral BID  . multivitamin with minerals  1 tablet Oral Daily  . nystatin  5 mL Oral QID  . tamsulosin  0.4 mg Oral Daily   Continuous Infusions: .  sodium chloride 125 mL/hr at 02/17/17 0345  . sodium chloride    . fluconazole (DIFLUCAN) IV Stopped (02/17/17 1331)   PRN Meds:.acetaminophen **OR** acetaminophen, HYDROcodone-acetaminophen, polyethylene glycol, polyvinyl alcohol   Antibiotics   Anti-infectives    Start     Dose/Rate Route Frequency Ordered Stop   02/17/17 1400  levofloxacin (LEVAQUIN) IVPB 750 mg  Status:  Discontinued     750 mg 100 mL/hr over 90 Minutes Intravenous Every 48 hours 02/15/17 1515 02/16/17 1043   02/17/17 1000  azithromycin (ZITHROMAX) tablet 1,200 mg  Status:  Discontinued     1,200 mg Oral Every Fri 02/15/17 1615 02/16/17 1308   02/16/17 1330  ceFEPIme (MAXIPIME) 2 g in  dextrose 5 % 50 mL IVPB  Status:  Discontinued     2 g 100 mL/hr over 30 Minutes Intravenous Every 24 hours 02/15/17 1515 02/16/17 1043   02/16/17 1200  fluconazole (DIFLUCAN) IVPB 400 mg     400 mg 100 mL/hr over 120 Minutes Intravenous Every 24 hours 02/16/17 1048     02/16/17 1000  fluconazole (DIFLUCAN) tablet 400 mg  Status:  Discontinued     400 mg Oral Daily 02/15/17 1615 02/16/17 1048   02/16/17 1000  sulfamethoxazole-trimethoprim (BACTRIM DS,SEPTRA DS) 800-160 MG per tablet 1 tablet  Status:  Discontinued     1 tablet Oral Daily 02/15/17 1615 02/16/17 1308   02/16/17 0200  vancomycin (VANCOCIN) 500 mg in sodium chloride 0.9 % 100 mL IVPB  Status:  Discontinued     500 mg 100 mL/hr over 60 Minutes Intravenous Every 12 hours 02/15/17 1515 02/16/17 1043   02/15/17 1315  ceFEPIme (MAXIPIME) 2 g in dextrose 5 % 50 mL IVPB     2 g 100 mL/hr over 30 Minutes Intravenous  Once 02/15/17 1307 02/15/17 1436   02/15/17 1300  levofloxacin (LEVAQUIN) IVPB 750 mg     750 mg 100 mL/hr over 90 Minutes Intravenous  Once 02/15/17 1254 02/15/17 1533   02/15/17 1300  aztreonam (AZACTAM) 2 g in dextrose 5 % 50 mL IVPB  Status:  Discontinued     2 g 100 mL/hr over 30 Minutes Intravenous  Once 02/15/17 1254 02/15/17 1307   02/15/17 1300  vancomycin (VANCOCIN) IVPB 1000 mg/200 mL premix     1,000 mg 200 mL/hr over 60 Minutes Intravenous  Once 02/15/17 1254 02/15/17 1436        Subjective:   [name] was seen and examined today.  Difficult to obtain any review of system from the patient, keeps looking on to one side however minimally responsive. Mother at the bedside. She was feeding him when I walked into the room.   Objective:   Vitals:   02/17/17 0030 02/17/17 0435 02/17/17 0700 02/17/17 1100  BP: 100/65 101/61    Pulse: 81 80    Resp: '18 19  20  ' Temp: 98.2 F (36.8 C) 98.1 F (36.7 C) (!) 97.3 F (36.3 C) 98.1 F (36.7 C)  TempSrc: Oral Oral Axillary Oral  SpO2: 99% 100% 98%  98%  Weight:        Intake/Output Summary (Last 24 hours) at 02/17/17 1403 Last data filed at 02/17/17 1000  Gross per 24 hour  Intake           5572.5 ml  Output             1950 ml  Net           3622.5 ml  Wt Readings from Last 3 Encounters:  02/16/17 57.1 kg (125 lb 12.8 oz)  02/07/17 61.7 kg (136 lb)     Exam   General: Alert and awake but nonverbal, not responding to any questions or commands, keeps looking to one side. Mother was feeding him and he was eating when I walked into the room.  Eyes:   HEENT:   Cardiovascular: S1 S2 auscultated, no rubs, murmurs or gallops. Regular rate and rhythm. No pedal edema b/l  Respiratory: Clear to auscultation bilaterally, no wheezing, rales or rhonchi  Gastrointestinal: Soft, nontender, nondistended, + bowel sounds  Ext: no pedal edema bilaterally  Neuro: does not follow commands   Musculoskeletal: No digital cyanosis, clubbing  Skin: No rashes  Psych: nonverbal   Data Reviewed:  I have personally reviewed following labs and imaging studies  Micro Results Recent Results (from the past 240 hour(s))  Culture, blood (Routine x 2)     Status: None (Preliminary result)   Collection Time: 02/15/17 12:45 PM  Result Value Ref Range Status   Specimen Description BLOOD LEFT ANTECUBITAL  Final   Special Requests   Final    BOTTLES DRAWN AEROBIC ONLY Blood Culture adequate volume   Culture NO GROWTH 2 DAYS  Final   Report Status PENDING  Incomplete  Culture, blood (Routine x 2)     Status: None (Preliminary result)   Collection Time: 02/15/17 12:51 PM  Result Value Ref Range Status   Specimen Description BLOOD RIGHT ANTECUBITAL  Final   Special Requests IN PEDIATRIC BOTTLE Blood Culture adequate volume  Final   Culture NO GROWTH 2 DAYS  Final   Report Status PENDING  Incomplete  CSF culture     Status: None (Preliminary result)   Collection Time: 02/16/17  1:29 PM  Result Value Ref Range Status   Specimen  Description CSF  Final   Special Requests NONE  Final   Gram Stain   Final    WBC PRESENT, PREDOMINANTLY PMN YEAST CYTOSPIN SMEAR CRITICAL RESULT CALLED TO, READ BACK BY AND VERIFIED WITH: Candie Chroman RN AT 1252 ON [id number] BY SJW    Culture NO GROWTH < 24 HOURS  Final   Report Status PENDING  Incomplete  MRSA PCR Screening     Status: None   Collection Time: 02/16/17  2:44 PM  Result Value Ref Range Status   MRSA by PCR NEGATIVE NEGATIVE Final    Comment:        The GeneXpert MRSA Assay (FDA approved for NASAL specimens only), is one component of a comprehensive MRSA colonization surveillance program. It is not intended to diagnose MRSA infection nor to guide or monitor treatment for MRSA infections.     Radiology Reports Dg Chest 1 View  Result Date: 02/15/2017 CLINICAL DATA:  Respiratory failure.  HIV disease. EXAM: CHEST 1 VIEW COMPARISON:  January 08, 2017 FINDINGS: There is no edema or consolidation. Heart size and pulmonary vascularity are normal. No adenopathy. No evident bone lesions. IMPRESSION: No edema or consolidation. Electronically Signed   By: Lowella Grip III M.D.   On: 02/15/2017 13:30   Ct Head Wo Contrast  Result Date: 02/15/2017 CLINICAL DATA:  Initial evaluation for acute altered mental status, history of meningitis with small vessel infarcts. EXAM: CT HEAD WITHOUT CONTRAST TECHNIQUE: Contiguous axial images were obtained from the base of the skull through the vertex without intravenous contrast. COMPARISON:  Comparison made with recent CT from 01/06/2017 as well as brain MRI from 12/27/2016. FINDINGS: Brain:  Stable cerebral volume. There has been interval evolution of previously identified infarctions related to meningitis, with progressive hypodensities seen at the level of the left caudate and lentiform nuclei, as well as the cortical gray matter and underlying subcortical/deep white matter of the bilateral frontal lobes near the vertex (series 3, image 24).  These areas were seen to be involved on previous brain MRI. No associated mass effect or hemorrhagic transformation. There is a probable new insult with 1 cm hypodensity seen at the right globus pallidus (series 3, image 15), not definitely seen on previous. No associated hemorrhage or mass effect. No other acute large vessel territory infarct. No acute intracranial hemorrhage. No mass lesion or midline shift. No mass effect. No hydrocephalus. No extra-axial fluid collection. Vascular: No hyperdense vessel. Skull: Scalp soft tissues and calvarium within normal limits. Sinuses/Orbits: Globes and orbital soft tissues within normal limits. Mild scattered mucoperiosteal thickening within the maxillary sinuses and right sphenoid sinus. Visualized paranasal sinuses are otherwise clear. Mastoids and middle ear cavities are clear. IMPRESSION: 1. 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 New 1 cm hypodensity at the right globus pallidus suspicious for new/interval acute/subacute infarction. 2. No evidence for intracranial hemorrhage or other complication. Electronically Signed   By: Jeannine Boga M.D.   On: 02/15/2017 17:31   Dg Fluoro Guide Lumbar Puncture  Result Date: 02/16/2017 CLINICAL DATA:  HNP, cryptococcal meningitis.  Altered mental status EXAM: DIAGNOSTIC LUMBAR PUNCTURE UNDER FLUOROSCOPIC GUIDANCE FLUOROSCOPY TIME:  Fluoroscopy Time:  0 minutes 12 second Radiation Exposure Index (if provided by the fluoroscopic device): Number of Acquired Spot Images: 0 PROCEDURE: Informed consent obtained from the patient's mother who signed the form. Informed consent was obtained prior to the procedure, including potential complications of headache, allergy, and pain. With the patient prone, the lower back was prepped with Betadine. 1% Lidocaine was used for local anesthesia. Lumbar  puncture was performed at the L3-4 level using a 20 gauge needle with return of clear CSF with an opening pressure of 44 cm water. Twenty-five ml of CSF were obtained for laboratory studies. Closing pressure 13 cm H2O. The patient tolerated the procedure well and there were no apparent complications. IMPRESSION: Successful lumbar puncture with elevated opening pressure 44 cm water Electronically Signed   By: Franchot Gallo M.D.   On: 02/16/2017 14:10   Dg Fluoro Guide Lumbar Puncture  Result Date: 01/30/2017 CLINICAL DATA:  HIV. EXAM: DIAGNOSTIC LUMBAR PUNCTURE UNDER FLUOROSCOPIC GUIDANCE FLUOROSCOPY TIME:  Fluoroscopy Time:  1 minutes and 12 seconds Radiation Exposure Index (if provided by the fluoroscopic device): Not applicable. Number of Acquired Spot Images: 1 PROCEDURE: Informed consent was obtained from the patient's mother prior to the procedure, including potential complications of headache, allergy, and pain. With the patient prone, the lower back was prepped with Betadine. 1% Lidocaine was used for local anesthesia. Lumbar puncture was performed at the L2-3 level using a 20 gauge needle with return of clear CSF with an opening pressure of 9 cm water. 3.5 ml of CSF were obtained for laboratory studies. The patient tolerated the procedure well and there were no apparent complications. Further fluid could not be obtained secondary to patient's clinical status and low pressure. Closing pressure was not performed secondary to low pressure throughout. IMPRESSION: Non complicated lumbar puncture as detailed above. Electronically Signed   By: Abigail Miyamoto M.D.   On: 01/30/2017 16:26   Dg  Fluoro Guide Lumbar Puncture  Result Date: 01/25/2017 Etheleen Mayhew, MD     01/25/2017  1:46 PM Lumbar puncture performed at L2-L3.  Opening pressure 22 cm H20.  22 mL clear CSF obtained and sent to lab for testing.  Please see full dictation in PACS for additional details.    Lab Data:  CBC:  Recent Labs Lab  02/15/17 1232 02/16/17 0518 02/16/17 1937 02/17/17 0314  WBC 5.4 6.2  --  4.8  NEUTROABS 4.3  --   --   --   HGB 7.6* 7.1* 7.5* 7.4*  HCT 22.5* 22.3* 24.0* 23.6*  MCV 84.6 87.8  --  88.4  PLT 316 136*  --  789*   Basic Metabolic Panel:  Recent Labs Lab 02/15/17 1413 02/16/17 0518 02/17/17 0314  NA 134* 138 141  K 4.2 4.3 3.8  CL 109 115* 118*  CO2 19* 14* 15*  GLUCOSE 122* 87 90  BUN 50* 46* 35*  CREATININE 2.09* 1.54* 0.92  CALCIUM 8.5* 8.4* 8.7*   GFR: Estimated Creatinine Clearance: 94 mL/min (by C-G formula based on SCr of 0.92 mg/dL). Liver Function Tests:  Recent Labs Lab 02/16/17 0518  AST 35  ALT 31  ALKPHOS 94  BILITOT 0.6  PROT 7.6  ALBUMIN 2.0*   No results for input(s): LIPASE, AMYLASE in the last 168 hours. No results for input(s): AMMONIA in the last 168 hours. Coagulation Profile: No results for input(s): INR, PROTIME in the last 168 hours. Cardiac Enzymes: No results for input(s): CKTOTAL, CKMB, CKMBINDEX, TROPONINI in the last 168 hours. BNP (last 3 results) No results for input(s): PROBNP in the last 8760 hours. HbA1C: No results for input(s): HGBA1C in the last 72 hours. CBG:  Recent Labs Lab 02/16/17 1605  GLUCAP 84   Lipid Profile: No results for input(s): CHOL, HDL, LDLCALC, TRIG, CHOLHDL, LDLDIRECT in the last 72 hours. Thyroid Function Tests: No results for input(s): TSH, T4TOTAL, FREET4, T3FREE, THYROIDAB in the last 72 hours. Anemia Panel: No results for input(s): VITAMINB12, FOLATE, FERRITIN, TIBC, IRON, RETICCTPCT in the last 72 hours. Urine analysis:    Component Value Date/Time   COLORURINE YELLOW 02/15/2017 1342   APPEARANCEUR HAZY (A) 02/15/2017 1342   LABSPEC 1.021 02/15/2017 1342   PHURINE 6.0 02/15/2017 1342   GLUCOSEU NEGATIVE 02/15/2017 1342   HGBUR SMALL (A) 02/15/2017 1342   BILIRUBINUR NEGATIVE 02/15/2017 1342   KETONESUR NEGATIVE 02/15/2017 1342   PROTEINUR 30 (A) 02/15/2017 1342   NITRITE POSITIVE  (A) 02/15/2017 1342   LEUKOCYTESUR TRACE (A) 02/15/2017 1342     Wissam Resor M.D. [facility] 02/17/2017, 2:03 PM  Pager: [phone] Between 7am to 7pm - call Pager - [phone]  After 7pm go to [email or web address] - password [email or web address]  Call night coverage person covering after 7pm

## 2017-02-18 ENCOUNTER — Inpatient Hospital Stay (HOSPITAL_COMMUNITY): Payer: Medicaid Other

## 2017-02-18 LAB — CBC
HEMATOCRIT: 22 % — AB (ref 39.0–52.0)
HEMOGLOBIN: 7 g/dL — AB (ref 13.0–17.0)
MCH: 27.7 pg (ref 26.0–34.0)
MCHC: 31.8 g/dL (ref 30.0–36.0)
MCV: 87 fL (ref 78.0–100.0)
Platelets: 121 10*3/uL — ABNORMAL LOW (ref 150–400)
RBC: 2.53 MIL/uL — AB (ref 4.22–5.81)
RDW: 15.9 % — ABNORMAL HIGH (ref 11.5–15.5)
WBC: 3.7 10*3/uL — AB (ref 4.0–10.5)

## 2017-02-18 LAB — CSF CELL COUNT WITH DIFFERENTIAL
RBC Count, CSF: 310 /mm3 — ABNORMAL HIGH
TUBE #: 1
WBC, CSF: 6 /mm3 — ABNORMAL HIGH (ref 0–5)

## 2017-02-18 LAB — CBC WITH DIFFERENTIAL/PLATELET
BASOS PCT: 0 %
Band Neutrophils: 4 %
Basophils Absolute: 0 10*3/uL (ref 0.0–0.1)
Blasts: 0 %
EOS PCT: 7 %
Eosinophils Absolute: 0.2 10*3/uL (ref 0.0–0.7)
HCT: 25.6 % — ABNORMAL LOW (ref 39.0–52.0)
HEMOGLOBIN: 8.1 g/dL — AB (ref 13.0–17.0)
LYMPHS ABS: 0.4 10*3/uL — AB (ref 0.7–4.0)
LYMPHS PCT: 13 %
MCH: 27.6 pg (ref 26.0–34.0)
MCHC: 31.6 g/dL (ref 30.0–36.0)
MCV: 87.4 fL (ref 78.0–100.0)
MONO ABS: 0.1 10*3/uL (ref 0.1–1.0)
MYELOCYTES: 0 %
Metamyelocytes Relative: 0 %
Monocytes Relative: 3 %
NEUTROS PCT: 73 %
NRBC: 0 /100{WBCs}
Neutro Abs: 2.7 10*3/uL (ref 1.7–7.7)
OTHER: 0 %
Platelets: 115 10*3/uL — ABNORMAL LOW (ref 150–400)
Promyelocytes Absolute: 0 %
RBC: 2.93 MIL/uL — ABNORMAL LOW (ref 4.22–5.81)
RDW: 15.8 % — ABNORMAL HIGH (ref 11.5–15.5)
WBC: 3.4 10*3/uL — ABNORMAL LOW (ref 4.0–10.5)

## 2017-02-18 LAB — BASIC METABOLIC PANEL
ANION GAP: 4 — AB (ref 5–15)
BUN: 23 mg/dL — ABNORMAL HIGH (ref 6–20)
CHLORIDE: 115 mmol/L — AB (ref 101–111)
CO2: 17 mmol/L — AB (ref 22–32)
Calcium: 8.1 mg/dL — ABNORMAL LOW (ref 8.9–10.3)
Creatinine, Ser: 0.54 mg/dL — ABNORMAL LOW (ref 0.61–1.24)
GFR calc non Af Amer: >60 mL/min (ref >60)
GLUCOSE: 96 mg/dL (ref 65–99)
POTASSIUM: 3.6 mmol/L (ref 3.5–5.1)
Sodium: 136 mmol/L (ref 135–145)

## 2017-02-18 LAB — HEMOGLOBIN AND HEMATOCRIT, BLOOD
HCT: 25.9 % — ABNORMAL LOW (ref 39.0–52.0)
HEMOGLOBIN: 8.1 g/dL — AB (ref 13.0–17.0)

## 2017-02-18 LAB — PROCALCITONIN: Procalcitonin: 0.22 ng/mL

## 2017-02-18 LAB — GLUCOSE, CSF: Glucose, CSF: 44 mg/dL (ref 40–70)

## 2017-02-18 LAB — PROTEIN, CSF: Total  Protein, CSF: 74 mg/dL — ABNORMAL HIGH (ref 15–45)

## 2017-02-18 LAB — PREPARE RBC (CROSSMATCH)

## 2017-02-18 LAB — SAVE SMEAR

## 2017-02-18 MED ORDER — LIDOCAINE HCL 1 % IJ SOLN
INTRAMUSCULAR | Status: AC
  Filled 2017-02-18: qty 10

## 2017-02-18 MED ORDER — LIDOCAINE HCL (PF) 1 % IJ SOLN
INTRAMUSCULAR | Status: AC
  Filled 2017-02-18: qty 30

## 2017-02-18 MED ORDER — PNEUMOCOCCAL VAC POLYVALENT 25 MCG/0.5ML IJ INJ
0.5000 mL | INJECTION | INTRAMUSCULAR | Status: AC
  Administered 2017-02-19: 0.5 mL via INTRAMUSCULAR
  Filled 2017-02-18: qty 0.5

## 2017-02-18 MED ORDER — SODIUM CHLORIDE 0.9 % IV SOLN
Freq: Once | INTRAVENOUS | Status: AC
  Administered 2017-02-18: 16:00:00 via INTRAVENOUS

## 2017-02-18 NOTE — Progress Notes (Addendum)
Subjective: Slight improvemetn per mom. ICP yesterday was 18.5  Exam: Vitals:   02/18/17 0400 02/18/17 0747  BP: 117/79 123/83  Pulse: 80   Resp: (!) 21 (!) 26  Temp: 97.8 F (36.6 C) 98.6 F (37 C)   Gen: In bed, NAD Resp: non-labored breathing, no acute distress Abd: soft, nt  Neuro: MS: awake, alert, does not follow commands CN: R pupil slightly larger than left. Left gaze preference but does fixate and track on the left, just does not cross midline.  Motor: right spastic hemiparesis, moves left side purposefully, withdraws right.  Sensory:as above.   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 ICP was initially high which could suggest treatment failure, though will await culture to see if the yeast seen was living. ICP was not elevated on subsequent LP.   If repeat LP today with continued normal ICP, may not need VP shunt, but instead could do serial(weekly) LPs for a couple of weeks to ensure ICP is not again climbing.   Recommendations: 1) LP again today.   Roland Rack, MD [facility] [phone]  If 7pm- 7am, please page neurology on call as listed in [place].

## 2017-02-18 NOTE — Progress Notes (Signed)
[facility]                                                                              Patient Demographics  William Pena, is a 31 y.o. male, DOB - 1986-05-01, AVW:[id number]  Admit date - 02/15/2017   Admitting Physician Donne Hazel, MD  Outpatient Primary MD for the patient is [name], [name], MD  Outpatient specialists:   LOS - 3  days   Medical records reviewed and are as summarized below:    Chief Complaint  Patient presents with  . Altered Mental Status       Brief summary   Patient is a 31 year old male with AIDS, recent admission for perirectal abscess, development of cryptococcal meningoencephalitis, recent CVA,  ARF, severe protein calorie malnutrition who presents to the ED from facility with increased mental status change. Patient was in usual state of health. On recent discharge (dated 7/9), patient was recommended by ID to continue fluconazole x 4 more weeks before resuming antiviral meds for HIV. Patient is non-verbal at baseline. Patient's mother reported patient's poor PO intake recently.   Assessment & Plan    Principal Problem:   Sepsis ([place]) In the setting of recent cryptococcal meningitis - Patient met sepsis criteria given temp of 102.9, respiratory rate of 28, tachycardia 120s to 130s, BP borderline -ID, neurology following - Continue fluconazole cultures negative so far, per ID likely will need 12 months of therapy - LP per neurology  Active Problems:   AIDS (acquired immune deficiency syndrome) ([place]) -  hold off on antiretrovirals, defer starting another 4 weeks due to high opening pressure - continue holding Bactrim and Zithromax for now    Encephalopathy acute -  still encephalopathic, likely due to # 1. 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 Recent CT abdomen in May 2018 had not shown any abnormal lymphadenopathy or splenomegaly. - transfuse 2 units packed RBC    Code Status: Full CODE STATUS  DVT Prophylaxis: SCDs  Family Communication: Discussed in detail with the patient, all imaging results, lab results explained to the patient's mother at the bedside  Disposition Plan  Time Spent in minutes 35 minutes  Procedures:  LP  Consultants:   Infectious disease Neurology Hematology oncology  Antimicrobials:   Fluconazole 7/19   Medications  Scheduled Meds: . amantadine  100 mg Oral BID WC  . feeding supplement (ENSURE ENLIVE)  237 mL Oral BID BM  . feeding supplement (PRO-STAT SUGAR FREE 64)  30 mL Oral BID  . lidocaine      .  multivitamin with minerals  1 tablet Oral Daily  . nystatin  5 mL Oral QID  . [START ON 02/19/2017] pneumococcal 23 valent vaccine  0.5 mL Intramuscular Tomorrow-1000  . tamsulosin  0.4 mg Oral Daily   Continuous Infusions: . sodium chloride 125 mL/hr at 02/18/17 0500  . sodium chloride    . fluconazole (DIFLUCAN) IV 400 mg (02/18/17 1120)   PRN Meds:.acetaminophen **OR** acetaminophen, HYDROcodone-acetaminophen, polyethylene glycol, polyvinyl alcohol   Antibiotics   Anti-infectives    Start     Dose/Rate Route Frequency Ordered Stop   02/17/17 1400  levofloxacin (LEVAQUIN) IVPB 750 mg  Status:  Discontinued     750 mg 100 mL/hr over 90 Minutes Intravenous Every 48 hours 02/15/17 1515 02/16/17 1043    02/17/17 1000  azithromycin (ZITHROMAX) tablet 1,200 mg  Status:  Discontinued     1,200 mg Oral Every Fri 02/15/17 1615 02/16/17 1308   02/16/17 1330  ceFEPIme (MAXIPIME) 2 g in dextrose 5 % 50 mL IVPB  Status:  Discontinued     2 g 100 mL/hr over 30 Minutes Intravenous Every 24 hours 02/15/17 1515 02/16/17 1043   02/16/17 1200  fluconazole (DIFLUCAN) IVPB 400 mg     400 mg 100 mL/hr over 120 Minutes Intravenous Every 24 hours 02/16/17 1048     02/16/17 1000  fluconazole (DIFLUCAN) tablet 400 mg  Status:  Discontinued     400 mg Oral Daily 02/15/17 1615 02/16/17 1048   02/16/17 1000  sulfamethoxazole-trimethoprim (BACTRIM DS,SEPTRA DS) 800-160 MG per tablet 1 tablet  Status:  Discontinued     1 tablet Oral Daily 02/15/17 1615 02/16/17 1308   02/16/17 0200  vancomycin (VANCOCIN) 500 mg in sodium chloride 0.9 % 100 mL IVPB  Status:  Discontinued     500 mg 100 mL/hr over 60 Minutes Intravenous Every 12 hours 02/15/17 1515 02/16/17 1043   02/15/17 1315  ceFEPIme (MAXIPIME) 2 g in dextrose 5 % 50 mL IVPB     2 g 100 mL/hr over 30 Minutes Intravenous  Once 02/15/17 1307 02/15/17 1436   02/15/17 1300  levofloxacin (LEVAQUIN) IVPB 750 mg     750 mg 100 mL/hr over 90 Minutes Intravenous  Once 02/15/17 1254 02/15/17 1533   02/15/17 1300  aztreonam (AZACTAM) 2 g in dextrose 5 % 50 mL IVPB  Status:  Discontinued     2 g 100 mL/hr over 30 Minutes Intravenous  Once 02/15/17 1254 02/15/17 1307   02/15/17 1300  vancomycin (VANCOCIN) IVPB 1000 mg/200 mL premix     1,000 mg 200 mL/hr over 60 Minutes Intravenous  Once 02/15/17 1254 02/15/17 1436        Subjective:   William Pena was seen and examined today.Unable to obtain any review of systems from the patient due to his mental status, nonverbal, mother at the bedside.  .   Objective:   Vitals:   02/18/17 0000 02/18/17 0300 02/18/17 0400 02/18/17 0747  BP: 130/80 114/72 117/79 123/83  Pulse: 91  80   Resp: (!) 22 (!) 22 (!) 21 (!) 26    Temp: 98 F (36.7 C) 97.8 F (36.6 C) 97.8 F (36.6 C) 98.6 F (37 C)  TempSrc: Oral Axillary Axillary Axillary  SpO2: 98%  99%   Weight:        Intake/Output Summary (Last 24 hours) at 02/18/17 1302 Last data filed at 02/18/17 0939  Gross per 24 hour  Intake  4795 ml  Output                0 ml  Net             4795 ml     Wt Readings from Last 3 Encounters:  02/16/17 57.1 kg (125 lb 12.8 oz)  02/07/17 61.7 kg (136 lb)     Exam   General: Alert and awake, Nonverbal   Eyes:  HEENT:   Cardiovascular: S1 S2 auscultated, no rubs, murmurs or gallops. Regular rate and rhythm. No pedal edema b/l  Respiratory: Clear to auscultation bilaterally, no wheezing, rales or rhonchi  Gastrointestinal: Soft, nontender, nondistended, + bowel sounds  Ext: no pedal edema bilaterally  Neuro: difficult to assess his strength however was able to grip my fingers with his left hand  Musculoskeletal: No digital cyanosis, clubbing  Skin: No rashes  Psych: alert and awake   Data Reviewed:  I have personally reviewed following labs and imaging studies  Micro Results Recent Results (from the past 240 hour(s))  Culture, blood (Routine x 2)     Status: None (Preliminary result)   Collection Time: 02/15/17 12:45 PM  Result Value Ref Range Status   Specimen Description BLOOD LEFT ANTECUBITAL  Final   Special Requests   Final    BOTTLES DRAWN AEROBIC ONLY Blood Culture adequate volume   Culture NO GROWTH 2 DAYS  Final   Report Status PENDING  Incomplete  Culture, blood (Routine x 2)     Status: None (Preliminary result)   Collection Time: 02/15/17 12:51 PM  Result Value Ref Range Status   Specimen Description BLOOD RIGHT ANTECUBITAL  Final   Special Requests IN PEDIATRIC BOTTLE Blood Culture adequate volume  Final   Culture NO GROWTH 2 DAYS  Final   Report Status PENDING  Incomplete  CSF culture     Status: None (Preliminary result)   Collection Time: 02/16/17  1:29  PM  Result Value Ref Range Status   Specimen Description CSF  Final   Special Requests NONE  Final   Gram Stain   Final    WBC PRESENT, PREDOMINANTLY PMN YEAST CYTOSPIN SMEAR CRITICAL RESULT CALLED TO, READ BACK BY AND VERIFIED WITH: Candie Chroman RN AT 1433 ON [id number] BY SJW    Culture NO GROWTH 2 DAYS  Final   Report Status PENDING  Incomplete  MRSA PCR Screening     Status: None   Collection Time: 02/16/17  2:44 PM  Result Value Ref Range Status   MRSA by PCR NEGATIVE NEGATIVE Final    Comment:        The GeneXpert MRSA Assay (FDA approved for NASAL specimens only), is one component of a comprehensive MRSA colonization surveillance program. It is not intended to diagnose MRSA infection nor to guide or monitor treatment for MRSA infections.     Radiology Reports Dg Chest 1 View  Result Date: 02/15/2017 CLINICAL DATA:  Respiratory failure.  HIV disease. EXAM: CHEST 1 VIEW COMPARISON:  January 08, 2017 FINDINGS: There is no edema or consolidation. Heart size and pulmonary vascularity are normal. No adenopathy. No evident bone lesions. IMPRESSION: No edema or consolidation. Electronically Signed   By: Lowella Grip III M.D.   On: 02/15/2017 13:30   Ct Head Wo Contrast  Result Date: 02/15/2017 CLINICAL DATA:  Initial evaluation for acute altered mental status, history of meningitis with small vessel infarcts. EXAM: CT HEAD WITHOUT CONTRAST TECHNIQUE: Contiguous axial images were obtained from the base of  the skull through the vertex without intravenous contrast. COMPARISON:  Comparison made with recent CT from 01/06/2017 as well as brain MRI from 12/27/2016. FINDINGS: Brain: Stable cerebral volume. There has been interval evolution of previously identified infarctions related to meningitis, with progressive hypodensities seen at the level of the left caudate and lentiform nuclei, as well as the cortical gray matter and underlying subcortical/deep white matter of the bilateral frontal  lobes near the vertex (series 3, image 24). These areas were seen to be involved on previous brain MRI. No associated mass effect or hemorrhagic transformation. There is a probable new insult with 1 cm hypodensity seen at the right globus pallidus (series 3, image 15), not definitely seen on previous. No associated hemorrhage or mass effect. No other acute large vessel territory infarct. No acute intracranial hemorrhage. No mass lesion or midline shift. No mass effect. No hydrocephalus. No extra-axial fluid collection. Vascular: No hyperdense vessel. Skull: Scalp soft tissues and calvarium within normal limits. Sinuses/Orbits: Globes and orbital soft tissues within normal limits. Mild scattered mucoperiosteal thickening within the maxillary sinuses and right sphenoid sinus. Visualized paranasal sinuses are otherwise clear. Mastoids and middle ear cavities are clear. IMPRESSION: 1. 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 New 1 cm hypodensity at the right globus pallidus suspicious for new/interval acute/subacute infarction. 2. No evidence for intracranial hemorrhage or other complication. Electronically Signed   By: Rise Mu M.D.   On: 02/15/2017 17:31   Dg Fluoro Guide Lumbar Puncture  Result Date: 02/16/2017 CLINICAL DATA:  HNP, cryptococcal meningitis.  Altered mental status EXAM: DIAGNOSTIC LUMBAR PUNCTURE UNDER FLUOROSCOPIC GUIDANCE FLUOROSCOPY TIME:  Fluoroscopy Time:  0 minutes 12 second Radiation Exposure Index (if provided by the fluoroscopic device): Number of Acquired Spot Images: 0 PROCEDURE: Informed consent obtained from the patient's mother who signed the form. Informed consent was obtained prior to the procedure, including potential complications of headache, allergy, and pain. With the patient prone, the lower back was prepped with Betadine. 1% Lidocaine  was used for local anesthesia. Lumbar puncture was performed at the L3-4 level using a 20 gauge needle with return of clear CSF with an opening pressure of 44 cm water. Twenty-five ml of CSF were obtained for laboratory studies. Closing pressure 13 cm H2O. The patient tolerated the procedure well and there were no apparent complications. IMPRESSION: Successful lumbar puncture with elevated opening pressure 44 cm water Electronically Signed   By: Marlan Palau M.D.   On: 02/16/2017 14:10   Dg Fluoro Guide Lumbar Puncture  Result Date: 01/30/2017 CLINICAL DATA:  HIV. EXAM: DIAGNOSTIC LUMBAR PUNCTURE UNDER FLUOROSCOPIC GUIDANCE FLUOROSCOPY TIME:  Fluoroscopy Time:  1 minutes and 12 seconds Radiation Exposure Index (if provided by the fluoroscopic device): Not applicable. Number of Acquired Spot Images: 1 PROCEDURE: Informed consent was obtained from the patient's mother prior to the procedure, including potential complications of headache, allergy, and pain. With the patient prone, the lower back was prepped with Betadine. 1% Lidocaine was used for local anesthesia. Lumbar puncture was performed at the L2-3 level using a 20 gauge needle with return of clear CSF with an opening pressure of 9 cm water. 3.5 ml of CSF were obtained for laboratory studies. The patient tolerated the procedure well and there were no apparent complications. Further fluid could not be obtained secondary to patient's clinical status and low pressure. Closing pressure was not performed secondary to low pressure  throughout. IMPRESSION: Non complicated lumbar puncture as detailed above. Electronically Signed   By: Abigail Miyamoto M.D.   On: 01/30/2017 16:26   Dg Fluoro Guide Lumbar Puncture  Result Date: 01/25/2017 Etheleen Mayhew, MD     01/25/2017  1:46 PM Lumbar puncture performed at L2-L3.  Opening pressure 22 cm H20.  22 mL clear CSF obtained and sent to lab for testing.  Please see full dictation in PACS for additional details.     Lab Data:  CBC:  Recent Labs Lab 02/15/17 1232 02/16/17 0518 02/16/17 1937 02/17/17 0314 02/17/17 2122 02/18/17 0611  WBC 5.4 6.2  --  4.8  --  3.7*  NEUTROABS 4.3  --   --   --   --   --   HGB 7.6* 7.1* 7.5* 7.4*  --  7.0*  HCT 22.5* 22.3* 24.0* 23.6*  --  22.0*  MCV 84.6 87.8  --  88.4  --  87.0  PLT 316 136*  --  109* 113* 627*   Basic Metabolic Panel:  Recent Labs Lab 02/15/17 1413 02/16/17 0518 02/17/17 0314 02/18/17 0611  NA 134* 138 141 136  K 4.2 4.3 3.8 3.6  CL 109 115* 118* 115*  CO2 19* 14* 15* 17*  GLUCOSE 122* 87 90 96  BUN 50* 46* 35* 23*  CREATININE 2.09* 1.54* 0.92 0.54*  CALCIUM 8.5* 8.4* 8.7* 8.1*   GFR: Estimated Creatinine Clearance: 108.1 mL/min (A) (by C-G formula based on SCr of 0.54 mg/dL (L)). Liver Function Tests:  Recent Labs Lab 02/16/17 0518  AST 35  ALT 31  ALKPHOS 94  BILITOT 0.6  PROT 7.6  ALBUMIN 2.0*   No results for input(s): LIPASE, AMYLASE in the last 168 hours. No results for input(s): AMMONIA in the last 168 hours. Coagulation Profile:  Recent Labs Lab 02/17/17 2122  INR 1.20   Cardiac Enzymes: No results for input(s): CKTOTAL, CKMB, CKMBINDEX, TROPONINI in the last 168 hours. BNP (last 3 results) No results for input(s): PROBNP in the last 8760 hours. HbA1C: No results for input(s): HGBA1C in the last 72 hours. CBG:  Recent Labs Lab 02/16/17 1605  GLUCAP 84   Lipid Profile: No results for input(s): CHOL, HDL, LDLCALC, TRIG, CHOLHDL, LDLDIRECT in the last 72 hours. Thyroid Function Tests: No results for input(s): TSH, T4TOTAL, FREET4, T3FREE, THYROIDAB in the last 72 hours. Anemia Panel:  Recent Labs  02/17/17 2122  VITAMINB12 638  FOLATE 13.7  FERRITIN 1,433*  TIBC 106*  IRON 48  RETICCTPCT <0.4*   Urine analysis:    Component Value Date/Time   COLORURINE YELLOW 02/15/2017 1342   APPEARANCEUR HAZY (A) 02/15/2017 1342   LABSPEC 1.021 02/15/2017 1342   PHURINE 6.0 02/15/2017 1342    GLUCOSEU NEGATIVE 02/15/2017 1342   HGBUR SMALL (A) 02/15/2017 1342   BILIRUBINUR NEGATIVE 02/15/2017 1342   KETONESUR NEGATIVE 02/15/2017 1342   PROTEINUR 30 (A) 02/15/2017 1342   NITRITE POSITIVE (A) 02/15/2017 1342   LEUKOCYTESUR TRACE (A) 02/15/2017 1342     William Pena M.D. [facility] 02/18/2017, 1:02 PM  Pager: [phone] Between 7am to 7pm - call Pager - [phone]  After 7pm go to [email or web address] - password [email or web address]  Call night coverage person covering after 7pm

## 2017-02-18 NOTE — Progress Notes (Signed)
[facility]   Reason for visit: Follow up on Cryptococcal meningitis  Interval History: LP ordered for today, CSF culture without any growth so far.  Mother at bedside.   Physical Exam: Constitutional:  Vitals:   02/18/17 0400 02/18/17 0747  BP: 117/79 123/83  Pulse: 80   Resp: (!) 21 (!) 26  Temp: 97.8 F (36.6 C) 98.6 F (37 C)   patient appears in nad, sleeping Respiratory: Normal respiratory effort; CTA B Cardiovascular: tachy RR GI: soft, nt, nd HENT: right ear with crusted lesion  Review of Systems: Unable to be assessed due to mental status  Lab Results  Component Value Date   WBC 3.7 (L) 02/18/2017   HGB 7.0 (L) 02/18/2017   HCT 22.0 (L) 02/18/2017   MCV 87.0 02/18/2017   PLT 121 (L) 02/18/2017    Lab Results  Component Value Date   CREATININE 0.54 (L) 02/18/2017   BUN 23 (H) 02/18/2017   NA 136 02/18/2017   K 3.6 02/18/2017   CL 115 (H) 02/18/2017   CO2 17 (L) 02/18/2017    Lab Results  Component Value Date   ALT 31 02/16/2017   AST 35 02/16/2017   ALKPHOS 94 02/16/2017     Microbiology: Recent Results (from the past 240 hour(s))  Culture, blood (Routine x 2)     Status: None (Preliminary result)   Collection Time: 02/15/17 12:45 PM  Result Value Ref Range Status   Specimen Description BLOOD LEFT ANTECUBITAL  Final   Special Requests   Final    BOTTLES DRAWN AEROBIC ONLY Blood Culture adequate volume   Culture NO GROWTH 2 DAYS  Final   Report Status PENDING  Incomplete  Culture, blood (Routine x 2)     Status: None (Preliminary result)   Collection Time: 02/15/17 12:51 PM  Result Value Ref Range Status   Specimen Description BLOOD RIGHT ANTECUBITAL  Final   Special Requests IN PEDIATRIC BOTTLE Blood Culture adequate volume  Final   Culture NO GROWTH 2 DAYS  Final   Report Status PENDING  Incomplete  CSF culture     Status: None (Preliminary result)   Collection Time: 02/16/17  1:29 PM  Result Value Ref Range  Status   Specimen Description CSF  Final   Special Requests NONE  Final   Gram Stain   Final    WBC PRESENT, PREDOMINANTLY PMN YEAST CYTOSPIN SMEAR CRITICAL RESULT CALLED TO, READ BACK BY AND VERIFIED WITH: Candie Chroman RN AT 0277 ON [id number] BY SJW    Culture NO GROWTH < 24 HOURS  Final   Report Status PENDING  Incomplete  MRSA PCR Screening     Status: None   Collection Time: 02/16/17  2:44 PM  Result Value Ref Range Status   MRSA by PCR NEGATIVE NEGATIVE Final    Comment:        The GeneXpert MRSA Assay (FDA approved for NASAL specimens only), is one component of a comprehensive MRSA colonization surveillance program. It is not intended to diagnose MRSA infection nor to guide or monitor treatment for MRSA infections.     Impression/Plan:  1. Cryptococcal meningitis - on fluconazole 400 mg.  Culture ngtd so far.  Will continue with 400 mg for the near future.  Will need likely 12 months of therapy  2. HIV - still holding on ARVs and I will likely defer starting another 4 weeks due to high opening pressure and consider after another month.  I  spoke with the patients mom about this and that the longer he goes without ARVs due to #1, the decreased likelihood of recovery.  However starting ARVs has an increased risk in IRIS and increased ICP.    3.  Opportunistic infection prophylaxis - continue holding Bactrim and azithromycin for now.

## 2017-02-19 DIAGNOSIS — D469 Myelodysplastic syndrome, unspecified: Secondary | ICD-10-CM

## 2017-02-19 DIAGNOSIS — B451 Cerebral cryptococcosis: Secondary | ICD-10-CM

## 2017-02-19 DIAGNOSIS — D649 Anemia, unspecified: Secondary | ICD-10-CM

## 2017-02-19 DIAGNOSIS — B458 Other forms of cryptococcosis: Secondary | ICD-10-CM

## 2017-02-19 DIAGNOSIS — B450 Pulmonary cryptococcosis: Secondary | ICD-10-CM

## 2017-02-19 DIAGNOSIS — A419 Sepsis, unspecified organism: Secondary | ICD-10-CM

## 2017-02-19 DIAGNOSIS — B20 Human immunodeficiency virus [HIV] disease: Principal | ICD-10-CM

## 2017-02-19 LAB — TYPE AND SCREEN
ABO/RH(D): O POS
ANTIBODY SCREEN: NEGATIVE
UNIT DIVISION: 0
UNIT DIVISION: 0
Unit division: 0

## 2017-02-19 LAB — BASIC METABOLIC PANEL
Anion gap: 6 (ref 5–15)
BUN: 15 mg/dL (ref 6–20)
CHLORIDE: 111 mmol/L (ref 101–111)
CO2: 20 mmol/L — AB (ref 22–32)
CREATININE: 0.53 mg/dL — AB (ref 0.61–1.24)
Calcium: 8.4 mg/dL — ABNORMAL LOW (ref 8.9–10.3)
GFR calc non Af Amer: >60 mL/min (ref >60)
Glucose, Bld: 86 mg/dL (ref 65–99)
POTASSIUM: 3.6 mmol/L (ref 3.5–5.1)
Sodium: 137 mmol/L (ref 135–145)

## 2017-02-19 LAB — CBC
HEMATOCRIT: 29.9 % — AB (ref 39.0–52.0)
HEMOGLOBIN: 9.7 g/dL — AB (ref 13.0–17.0)
MCH: 28 pg (ref 26.0–34.0)
MCHC: 32.4 g/dL (ref 30.0–36.0)
MCV: 86.4 fL (ref 78.0–100.0)
Platelets: 108 10*3/uL — ABNORMAL LOW (ref 150–400)
RBC: 3.46 MIL/uL — AB (ref 4.22–5.81)
RDW: 15.6 % — ABNORMAL HIGH (ref 11.5–15.5)
WBC: 3.4 10*3/uL — ABNORMAL LOW (ref 4.0–10.5)

## 2017-02-19 LAB — BPAM RBC
BLOOD PRODUCT EXPIRATION DATE: 201808172359
BLOOD PRODUCT EXPIRATION DATE: 201808212359
Blood Product Expiration Date: 201808212359
ISSUE DATE / TIME: 201807191213
ISSUE DATE / TIME: 201807212137
ISSUE DATE / TIME: 201807211607
UNIT TYPE AND RH: 5100
UNIT TYPE AND RH: 5100
Unit Type and Rh: 5100

## 2017-02-19 LAB — CSF CULTURE: CULTURE: NO GROWTH

## 2017-02-19 LAB — CSF CULTURE W GRAM STAIN

## 2017-02-19 NOTE — Progress Notes (Signed)
[facility]                                                                              Patient Demographics  William Pena, is a 31 y.o. male, DOB - [birth year], MBT:[id number]  Admit date - 02/15/2017   Admitting Physician Donne Hazel, MD  Outpatient Primary MD for the patient is [name], [name], MD  Outpatient specialists:   LOS - 4  days   Medical records reviewed and are as summarized below:    Chief Complaint  Patient presents with  . Altered Mental Status       Brief summary   Patient is a 31 year old male with AIDS, recent admission for perirectal abscess, development of cryptococcal meningoencephalitis, recent CVA,  ARF, severe protein calorie malnutrition who presents to the ED from facility with increased mental status change. Patient was in usual state of health. On recent discharge (dated 7/9), patient was recommended by ID to continue fluconazole x 4 more weeks before resuming antiviral meds for HIV. Patient is non-verbal at baseline. Patient's mother reported patient's poor PO intake recently.   Assessment & Plan    Principal Problem:   Sepsis ([place]) In the setting of recent cryptococcal meningitis - Patient met sepsis criteria given temp of 102.9, respiratory rate of 28, tachycardia 120s to 130s, BP borderline - Continue fluconazole cultures negative so far, per ID likely will need 12 months of therapy - Serial LPs per neurology  Active Problems:   AIDS (acquired immune deficiency syndrome) ([place]) -  hold off on antiretrovirals, defer starting another 4 weeks due to high opening pressure - continue holding Bactrim and Zithromax for now    Encephalopathy acute - still encephalopathic, likely due to # 1. 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 Recent CT abdomen in May 2018 had not shown any abnormal lymphadenopathy or splenomegaly. Dr. Marin Olp will follow. - transfused 2 units packed RBC, hemoglobin stable at 9.7 today    Code Status: Full CODE STATUS  DVT Prophylaxis: SCDs  Family Communication: No family member at the bedside today  Disposition Plan  Time Spent in minutes 35 minutes  Procedures:  LP  Consultants:   Infectious disease Neurology Hematology oncology  Antimicrobials:   Fluconazole 7/19   Medications  Scheduled Meds: . amantadine  100 mg Oral BID WC  . feeding supplement (ENSURE ENLIVE)  237 mL Oral BID BM  . feeding supplement (PRO-STAT SUGAR FREE 64)  30 mL Oral BID  . multivitamin with minerals  1 tablet Oral Daily  . nystatin  5 mL Oral QID  . tamsulosin  0.4 mg Oral Daily  Continuous Infusions: . sodium chloride 50 mL/hr at 02/19/17 0639  . fluconazole (DIFLUCAN) IV Stopped (02/18/17 1320)   PRN Meds:.acetaminophen **OR** acetaminophen, HYDROcodone-acetaminophen, polyethylene glycol, polyvinyl alcohol   Antibiotics   Anti-infectives    Start     Dose/Rate Route Frequency Ordered Stop   02/17/17 1400  levofloxacin (LEVAQUIN) IVPB 750 mg  Status:  Discontinued     750 mg 100 mL/hr over 90 Minutes Intravenous Every 48 hours 02/15/17 1515 02/16/17 1043   02/17/17 1000  azithromycin (ZITHROMAX) tablet 1,200 mg  Status:  Discontinued     1,200 mg Oral Every Fri 02/15/17 1615 02/16/17 1308   02/16/17 1330  ceFEPIme (MAXIPIME) 2 g in dextrose 5 % 50 mL IVPB  Status:  Discontinued     2 g 100 mL/hr over 30  Minutes Intravenous Every 24 hours 02/15/17 1515 02/16/17 1043   02/16/17 1200  fluconazole (DIFLUCAN) IVPB 400 mg     400 mg 100 mL/hr over 120 Minutes Intravenous Every 24 hours 02/16/17 1048     02/16/17 1000  fluconazole (DIFLUCAN) tablet 400 mg  Status:  Discontinued     400 mg Oral Daily 02/15/17 1615 02/16/17 1048   02/16/17 1000  sulfamethoxazole-trimethoprim (BACTRIM DS,SEPTRA DS) 800-160 MG per tablet 1 tablet  Status:  Discontinued     1 tablet Oral Daily 02/15/17 1615 02/16/17 1308   02/16/17 0200  vancomycin (VANCOCIN) 500 mg in sodium chloride 0.9 % 100 mL IVPB  Status:  Discontinued     500 mg 100 mL/hr over 60 Minutes Intravenous Every 12 hours 02/15/17 1515 02/16/17 1043   02/15/17 1315  ceFEPIme (MAXIPIME) 2 g in dextrose 5 % 50 mL IVPB     2 g 100 mL/hr over 30 Minutes Intravenous  Once 02/15/17 1307 02/15/17 1436   02/15/17 1300  levofloxacin (LEVAQUIN) IVPB 750 mg     750 mg 100 mL/hr over 90 Minutes Intravenous  Once 02/15/17 1254 02/15/17 1533   02/15/17 1300  aztreonam (AZACTAM) 2 g in dextrose 5 % 50 mL IVPB  Status:  Discontinued     2 g 100 mL/hr over 30 Minutes Intravenous  Once 02/15/17 1254 02/15/17 1307   02/15/17 1300  vancomycin (VANCOCIN) IVPB 1000 mg/200 mL premix     1,000 mg 200 mL/hr over 60 Minutes Intravenous  Once 02/15/17 1254 02/15/17 1436        Subjective:   William Pena was seen and examined today. No family member at the bedside today, patient otherwise alert and awake, nonverbal no fevers or chills..   Objective:   Vitals:   02/19/17 0500 02/19/17 0505 02/19/17 0600 02/19/17 0819  BP:  (!) 111/91  (!) 126/94  Pulse: (!) 32 (!) 46 (!) 40 (!) 46  Resp: '18 16 16 19  ' Temp:    98.6 F (37 C)  TempSrc:    Oral  SpO2: 100% 100% 100% 100%  Weight:      Height:        Intake/Output Summary (Last 24 hours) at 02/19/17 1053 Last data filed at 02/19/17 0900  Gross per 24 hour  Intake          3351.75 ml  Output              2650 ml  Net           701.75 ml     Wt Readings from Last 3 Encounters:  02/16/17 57.1 kg (125 lb 12.8 oz)  02/07/17 61.7 kg (136  lb)     Exam   General: Alert and Awake, NAD, mental status appears to be slightly better from yesterday  Eyes:   HEENT:    Cardiovascular: S1 S2 auscultated, no rubs, murmurs or gallops. Regular rate and rhythm. No pedal edema b/l  Respiratory: Clear to auscultation bilaterally, no wheezing, rales or rhonchi  Gastrointestinal: Soft, nontender, nondistended, + bowel sounds  Ext: no pedal edema bilaterally  Neuro: right-sided hemiparesis spastic  Musculoskeletal: No digital cyanosis, clubbing  Skin: Multiple tattoos  Psych:    Data Reviewed:  I have personally reviewed following labs and imaging studies  Micro Results Recent Results (from the past 240 hour(s))  Culture, blood (Routine x 2)     Status: None (Preliminary result)   Collection Time: 02/15/17 12:45 PM  Result Value Ref Range Status   Specimen Description BLOOD LEFT ANTECUBITAL  Final   Special Requests   Final    BOTTLES DRAWN AEROBIC ONLY Blood Culture adequate volume   Culture NO GROWTH 4 DAYS  Final   Report Status PENDING  Incomplete  Culture, blood (Routine x 2)     Status: None (Preliminary result)   Collection Time: 02/15/17 12:51 PM  Result Value Ref Range Status   Specimen Description BLOOD RIGHT ANTECUBITAL  Final   Special Requests IN PEDIATRIC BOTTLE Blood Culture adequate volume  Final   Culture NO GROWTH 4 DAYS  Final   Report Status PENDING  Incomplete  CSF culture     Status: None   Collection Time: 02/16/17  1:29 PM  Result Value Ref Range Status   Specimen Description CSF  Final   Special Requests NONE  Final   Gram Stain   Final    WBC PRESENT, PREDOMINANTLY PMN YEAST CYTOSPIN SMEAR CRITICAL RESULT CALLED TO, READ BACK BY AND VERIFIED WITH: Candie Chroman RN AT 1950 ON [id number] BY SJW    Culture NO GROWTH 3 DAYS  Final   Report Status 02/19/2017  FINAL  Final  MRSA PCR Screening     Status: None   Collection Time: 02/16/17  2:44 PM  Result Value Ref Range Status   MRSA by PCR NEGATIVE NEGATIVE Final    Comment:        The GeneXpert MRSA Assay (FDA approved for NASAL specimens only), is one component of a comprehensive MRSA colonization surveillance program. It is not intended to diagnose MRSA infection nor to guide or monitor treatment for MRSA infections.   CSF culture     Status: None (Preliminary result)   Collection Time: 02/18/17  2:00 PM  Result Value Ref Range Status   Specimen Description CSF  Final   Special Requests TUBE 2  Final   Gram Stain   Final    WBC PRESENT,BOTH PMN AND MONONUCLEAR YEAST CYTOSPIN SMEAR CRITICAL RESULT CALLED TO, READ BACK BY AND VERIFIED WITH: A. RODRIGUEZ RN, AT 2458 02/18/17 BY D. VANHOOK    Culture NO GROWTH < 24 HOURS  Final   Report Status PENDING  Incomplete  Culture, fungus without smear     Status: None (Preliminary result)   Collection Time: 02/18/17  2:00 PM  Result Value Ref Range Status   Specimen Description CSF  Final   Special Requests TUBE 2  Final   Culture NO FUNGUS ISOLATED AFTER 1 DAY  Final   Report Status PENDING  Incomplete    Radiology Reports Dg Chest 1 View  Result Date: 02/15/2017 CLINICAL DATA:  Respiratory failure.  HIV disease. EXAM: CHEST  1 VIEW COMPARISON:  January 08, 2017 FINDINGS: There is no edema or consolidation. Heart size and pulmonary vascularity are normal. No adenopathy. No evident bone lesions. IMPRESSION: No edema or consolidation. Electronically Signed   By: Lowella Grip III M.D.   On: 02/15/2017 13:30   Ct Head Wo Contrast  Result Date: 02/15/2017 CLINICAL DATA:  Initial evaluation for acute altered mental status, history of meningitis with small vessel infarcts. EXAM: CT HEAD WITHOUT CONTRAST TECHNIQUE: Contiguous axial images were obtained from the base of the skull through the vertex without intravenous contrast. COMPARISON:   Comparison made with recent CT from 01/06/2017 as well as brain MRI from 12/27/2016. FINDINGS: Brain: Stable cerebral volume. There has been interval evolution of previously identified infarctions related to meningitis, with progressive hypodensities seen at the level of the left caudate and lentiform nuclei, as well as the cortical gray matter and underlying subcortical/deep white matter of the bilateral frontal lobes near the vertex (series 3, image 24). These areas were seen to be involved on previous brain MRI. No associated mass effect or hemorrhagic transformation. There is a probable new insult with 1 cm hypodensity seen at the right globus pallidus (series 3, image 15), not definitely seen on previous. No associated hemorrhage or mass effect. No other acute large vessel territory infarct. No acute intracranial hemorrhage. No mass lesion or midline shift. No mass effect. No hydrocephalus. No extra-axial fluid collection. Vascular: No hyperdense vessel. Skull: Scalp soft tissues and calvarium within normal limits. Sinuses/Orbits: Globes and orbital soft tissues within normal limits. Mild scattered mucoperiosteal thickening within the maxillary sinuses and right sphenoid sinus. Visualized paranasal sinuses are otherwise clear. Mastoids and middle ear cavities are clear. IMPRESSION: 1. 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 New 1 cm hypodensity at the right globus pallidus suspicious for new/interval acute/subacute infarction. 2. No evidence for intracranial hemorrhage or other complication. Electronically Signed   By: Jeannine Boga M.D.   On: 02/15/2017 17:31   Dg Fluoro Guide Lumbar Puncture  Result Date: 02/18/2017 CLINICAL DATA:  Cryptococcal meningoencephalitis EXAM: DIAGNOSTIC LUMBAR PUNCTURE UNDER FLUOROSCOPIC GUIDANCE FLUOROSCOPY TIME:  Fluoroscopy Time:  12  seconds Radiation Exposure Index (if provided by the fluoroscopic device): 0.7 mGy Number of Acquired Spot Images: 1 screen capture PROCEDURE: Informed consent was obtained from the patient's mother prior to the procedure, including potential complications of headache, allergy, and pain. With the patient prone, the lower back was prepped with Betadine. 1% Lidocaine was used for local anesthesia. Lumbar puncture was performed at the L4-5 level using a 20 gauge needle with return of clear CSF with an opening pressure of 19 cm water. 5.5 ml of CSF were obtained for laboratory studies. The patient tolerated the procedure well and there were no apparent complications. IMPRESSION: Successful lumbar puncture, as described above. Opening pressure 19 cm water, within the upper limits of normal range. Electronically Signed   By: Julian Hy M.D.   On: 02/18/2017 14:45   Dg Fluoro Guide Lumbar Puncture  Result Date: 02/16/2017 CLINICAL DATA:  HNP, cryptococcal meningitis.  Altered mental status EXAM: DIAGNOSTIC LUMBAR PUNCTURE UNDER FLUOROSCOPIC GUIDANCE FLUOROSCOPY TIME:  Fluoroscopy Time:  0 minutes 12 second Radiation Exposure Index (if provided by the fluoroscopic device): Number of Acquired Spot Images: 0 PROCEDURE: Informed consent obtained from the patient's mother who signed the form. Informed consent was obtained prior to the procedure, including potential complications of  headache, allergy, and pain. With the patient prone, the lower back was prepped with Betadine. 1% Lidocaine was used for local anesthesia. Lumbar puncture was performed at the L3-4 level using a 20 gauge needle with return of clear CSF with an opening pressure of 44 cm water. Twenty-five ml of CSF were obtained for laboratory studies. Closing pressure 13 cm H2O. The patient tolerated the procedure well and there were no apparent complications. IMPRESSION: Successful lumbar puncture with elevated opening pressure 44 cm water Electronically  Signed   By: Franchot Gallo M.D.   On: 02/16/2017 14:10   Dg Fluoro Guide Lumbar Puncture  Result Date: 01/30/2017 CLINICAL DATA:  HIV. EXAM: DIAGNOSTIC LUMBAR PUNCTURE UNDER FLUOROSCOPIC GUIDANCE FLUOROSCOPY TIME:  Fluoroscopy Time:  1 minutes and 12 seconds Radiation Exposure Index (if provided by the fluoroscopic device): Not applicable. Number of Acquired Spot Images: 1 PROCEDURE: Informed consent was obtained from the patient's mother prior to the procedure, including potential complications of headache, allergy, and pain. With the patient prone, the lower back was prepped with Betadine. 1% Lidocaine was used for local anesthesia. Lumbar puncture was performed at the L2-3 level using a 20 gauge needle with return of clear CSF with an opening pressure of 9 cm water. 3.5 ml of CSF were obtained for laboratory studies. The patient tolerated the procedure well and there were no apparent complications. Further fluid could not be obtained secondary to patient's clinical status and low pressure. Closing pressure was not performed secondary to low pressure throughout. IMPRESSION: Non complicated lumbar puncture as detailed above. Electronically Signed   By: Abigail Miyamoto M.D.   On: 01/30/2017 16:26   Dg Fluoro Guide Lumbar Puncture  Result Date: 01/25/2017 Etheleen Mayhew, MD     01/25/2017  1:46 PM Lumbar puncture performed at L2-L3.  Opening pressure 22 cm H20.  22 mL clear CSF obtained and sent to lab for testing.  Please see full dictation in PACS for additional details.    Lab Data:  CBC:  Recent Labs Lab 02/15/17 1232 02/16/17 0518 02/16/17 1937 02/17/17 0314 02/17/17 2122 02/18/17 0611 02/18/17 1944 02/19/17 0335  WBC 5.4 6.2  --  4.8  --  3.7* 3.4* 3.4*  NEUTROABS 4.3  --   --   --   --   --  2.7  --   HGB 7.6* 7.1* 7.5* 7.4*  --  7.0* 8.1*  8.1* 9.7*  HCT 22.5* 22.3* 24.0* 23.6*  --  22.0* 25.6*  25.9* 29.9*  MCV 84.6 87.8  --  88.4  --  87.0 87.4 86.4  PLT 316 136*  --  109*  113* 121* 115* 179*   Basic Metabolic Panel:  Recent Labs Lab 02/15/17 1413 02/16/17 0518 02/17/17 0314 02/18/17 0611 02/19/17 0335  NA 134* 138 141 136 137  K 4.2 4.3 3.8 3.6 3.6  CL 109 115* 118* 115* 111  CO2 19* 14* 15* 17* 20*  GLUCOSE 122* 87 90 96 86  BUN 50* 46* 35* 23* 15  CREATININE 2.09* 1.54* 0.92 0.54* 0.53*  CALCIUM 8.5* 8.4* 8.7* 8.1* 8.4*   GFR: Estimated Creatinine Clearance: 108.1 mL/min (A) (by C-G formula based on SCr of 0.53 mg/dL (L)). Liver Function Tests:  Recent Labs Lab 02/16/17 0518  AST 35  ALT 31  ALKPHOS 94  BILITOT 0.6  PROT 7.6  ALBUMIN 2.0*   No results for input(s): LIPASE, AMYLASE in the last 168 hours. No results for input(s): AMMONIA in the last 168 hours.  Coagulation Profile:  Recent Labs Lab 02/17/17 2122  INR 1.20   Cardiac Enzymes: No results for input(s): CKTOTAL, CKMB, CKMBINDEX, TROPONINI in the last 168 hours. BNP (last 3 results) No results for input(s): PROBNP in the last 8760 hours. HbA1C: No results for input(s): HGBA1C in the last 72 hours. CBG:  Recent Labs Lab 02/16/17 1605  GLUCAP 84   Lipid Profile: No results for input(s): CHOL, HDL, LDLCALC, TRIG, CHOLHDL, LDLDIRECT in the last 72 hours. Thyroid Function Tests: No results for input(s): TSH, T4TOTAL, FREET4, T3FREE, THYROIDAB in the last 72 hours. Anemia Panel:  Recent Labs  02/17/17 2122  VITAMINB12 638  FOLATE 13.7  FERRITIN 1,433*  TIBC 106*  IRON 48  RETICCTPCT <0.4*   Urine analysis:    Component Value Date/Time   COLORURINE YELLOW 02/15/2017 1342   APPEARANCEUR HAZY (A) 02/15/2017 1342   LABSPEC 1.021 02/15/2017 1342   PHURINE 6.0 02/15/2017 1342   GLUCOSEU NEGATIVE 02/15/2017 1342   HGBUR SMALL (A) 02/15/2017 1342   BILIRUBINUR NEGATIVE 02/15/2017 1342   KETONESUR NEGATIVE 02/15/2017 1342   PROTEINUR 30 (A) 02/15/2017 1342   NITRITE POSITIVE (A) 02/15/2017 1342   LEUKOCYTESUR TRACE (A) 02/15/2017 1342     Ripudeep Rai  M.D. [facility] 02/19/2017, 10:53 AM  Pager: [phone] Between 7am to 7pm - call Pager - [phone]  After 7pm go to [email or web address] - password [email or web address]  Call night coverage person covering after 7pm

## 2017-02-19 NOTE — Progress Notes (Signed)
Subjective: LP yesterday again with normal ICP  Exam: Vitals:   02/19/17 0819 02/19/17 1201  BP: (!) 126/94 114/83  Pulse: (!) 46 (!) 47  Resp: 19 (!) 21  Temp: 98.6 F (37 C) 98.4 F (36.9 C)   Gen: In bed, NAD Resp: non-labored breathing, no acute distress Abd: soft, nt  Neuro: MS: awake, alert, does not follow commands CN: R pupil slightly larger than left. Left gaze preference but does fixate and track on the left, just does not cross midline.  Motor: right spastic hemiparesis, moves left side purposefully, withdraws right.  Sensory:as above.   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 ICP was initially high which could suggest treatment failure, though will await culture to see if the yeast seen was living. ICP was not elevated on two consecutive LPs.   At this time, I suspect that his recurrent presentation could've been due to ICP. One possibility would be impaired reabsorption due to the meningitis. Given that his ICPs and a persistently high, however, I would be hesitant to commit him to a VP shunt.  I would favor ensuring that his ICPs remained low by re-tapping him in the middle of next week, and if still is low then could repeat weekly for 1-2 weeks.  If his ICPs climb again, then I would favor discussing VP shunt with neurosurgery.  Recommendations: 1) LPs as described above 2) no further recommendations at this time, neurology will sign off, please call if further questions or concerns.  Roland Rack, MD [facility] [phone]  If 7pm- 7am, please page neurology on call as listed in [place].

## 2017-02-19 NOTE — Plan of Care (Signed)
Problem: Tissue Perfusion: Goal: Risk factors for ineffective tissue perfusion will decrease Outcome: Progressing Discussed with patient about plan of care and hanging blood products with no teach back observed at this time

## 2017-02-19 NOTE — Consult Note (Addendum)
Referral MD  Reason for Referral: Marked anemia, HIV positive-untreated, cryptococcal meningitis   Chief Complaint  Patient presents with  . Altered Mental Status  : Patient cannot give any history.  HPI: Mr. William Pena is a 31 year old African-American male. 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 He has not been on any anti-retroviral treatment.  He was admitted recently for another episode of the cryptococcal meningitis. He has been getting spinal taps. [name] on high-dose fluconazole  He has profound anemia. When he was admitted on the 18th, his white cell count was 5.4. Hemoglobin 7.6. Platelet count 316,000. His MCV was 85.  On July 8, his white cell count 3.1. Hemoglobin 7.1. Platelet count 124,000.  He has untreated HIV. 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 Back on May 23, his total CD4 count was 21.  His blood counts have not improved. On the 21st, his white cell count was 3.7. Hemoglobin 7 and platelet count 121,000. MCV was 22. Of note, his corrected reticulocyte count was basically 0.  There is no obvious bleeding. He had iron studies done. On July 20, his ferritin was 1433 and his iron saturation was 45%. He has had a normal folate. He's had a normal vitamin B-12.  There is no obvious sickle cell.  I looked at his blood under the microscope. I do not see any nucleated red blood cells. I saw no rouleau formation. There were no teardrop cells. He had no inclusion bodies. White cells appeared normal. There are no hypersegmented polys. He had no immature myeloid or lymphoid forms. Platelets were adequate in number and size.  He really cannot keep me any history. I do not think that he is a vegetarian.     Past Medical History:  Diagnosis Date  . Acute respiratory failure ([place]) 12/23/2016  . AIDS (acquired immune deficiency syndrome) ([place]) 07/07/2016  . AKI (acute kidney injury) ([place])   . Cavitary  pneumonia   . 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 Diffuse lymphadenopathy   . 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 Drug rash   . Elevated intracranial pressure   . Elevated LFTs 12/21/2016  . 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 Encephalopathy acute 12/23/2016  . HIV (human immunodeficiency virus infection) ([place])   . HIV disease ([place])   . Hypomagnesemia 12/23/2016  . Hyponatremia 12/21/2016  . Lymphadenopathy of head and neck   . Meningitis   . Normocytic anemia 12/21/2016  . Perirectal abscess   . Protein-calorie malnutrition, severe 01/28/2017  . Pulmonary infiltrates   . SIRS (systemic inflammatory response syndrome) ([place]) 12/23/2016  :  Past Surgical History:  Procedure Laterality Date  . FLEXIBLE SIGMOIDOSCOPY  04/04/2008  . INCISION AND DRAINAGE PERIRECTAL ABSCESS    . INCISION AND DRAINAGE PERIRECTAL ABSCESS N/A 12/21/2016   Procedure: IRRIGATION AND DEBRIDEMENT PERIRECTAL ABSCESS;  Surgeon: Ralene Ok, MD;  Location: [facility];  Service: General;  Laterality: N/A;  . PLACEMENT OF LUMBAR DRAIN N/A 12/27/2016   Procedure: PLACEMENT OF LUMBAR DRAIN;  Surgeon: Consuella Lose, MD;  Location: [facility];  Service: Neurosurgery;  Laterality: N/A;  :   Current Facility-Administered Medications:  .  0.9 %  sodium chloride infusion, , Intravenous, Continuous, Rai, Ripudeep K, MD, Last Rate: 50 mL/hr at 02/19/17 0639 .  acetaminophen (TYLENOL) tablet 650 mg, 650 mg, Oral, Q6H PRN **OR** acetaminophen (TYLENOL) suppository 650 mg, 650 mg, Rectal, Q6H PRN, Donne Hazel, MD, 650 mg  at 02/16/17 1052 .  amantadine (SYMMETREL) 50 MG/5ML solution 100 mg, 100 mg, Oral, BID WC, Donne Hazel, MD, 100 mg at 02/19/17 1105 .  feeding supplement (ENSURE ENLIVE) (ENSURE ENLIVE) liquid 237 mL, 237 mL, Oral, BID BM, Donne Hazel, MD, 237 mL at 02/19/17 1050 .  feeding supplement (PRO-STAT SUGAR FREE 64) liquid 30 mL, 30 mL, Oral, BID, Rai, Ripudeep K, MD, 30 mL at 02/19/17 1041 .  fluconazole  (DIFLUCAN) IVPB 400 mg, 400 mg, Intravenous, Q24H, Amin, Sumayya, MD, Last Rate: 100 mL/hr at 02/19/17 1106, 400 mg at 02/19/17 1106 .  HYDROcodone-acetaminophen (NORCO/VICODIN) 5-325 MG per tablet 1 tablet, 1 tablet, Oral, Q6H PRN, Donne Hazel, MD, 1 tablet at 02/18/17 1445 .  multivitamin with minerals tablet 1 tablet, 1 tablet, Oral, Daily, Rai, Ripudeep K, MD, 1 tablet at 02/19/17 1051 .  nystatin (MYCOSTATIN) 100000 UNIT/ML suspension 500,000 Units, 5 mL, Oral, QID, Donne Hazel, MD, 500,000 Units at 02/19/17 1042 .  polyethylene glycol (MIRALAX / GLYCOLAX) packet 17 g, 17 g, Oral, Daily PRN, Donne Hazel, MD .  polyvinyl alcohol (LIQUIFILM TEARS) 1.4 % ophthalmic solution 1 drop, 1 drop, Both Eyes, PRN, Rai, Ripudeep K, MD, 1 drop at 02/18/17 1120 .  tamsulosin (FLOMAX) capsule 0.4 mg, 0.4 mg, Oral, Daily, Donne Hazel, MD, 0.4 mg at 02/19/17 1000:  . amantadine  100 mg Oral BID WC  . feeding supplement (ENSURE ENLIVE)  237 mL Oral BID BM  . feeding supplement (PRO-STAT SUGAR FREE 64)  30 mL Oral BID  . multivitamin with minerals  1 tablet Oral Daily  . nystatin  5 mL Oral QID  . tamsulosin  0.4 mg Oral Daily  :  Allergies  Allergen Reactions  . Carrot Oil Swelling    "throat swell"   . Augmentin [Amoxicillin-Pot Clavulanate] Rash    "break out"  :  Family History  Problem Relation Age of Onset  . Diabetes Other   :  Social History   Social History  . Marital status: Single    Spouse name: N/A  . Number of children: N/A  . Years of education: N/A   Occupational History  . Not on file.   Social History Main Topics  . Smoking status: Former Smoker    Packs/day: 0.33    Types: Cigarettes  . Smokeless tobacco: Never Used  . Alcohol use No  . Drug use: No  . Sexual activity: Not on file   Other Topics Concern  . Not on file   Social History Narrative   Admitted to [facility]   Single   Former smoker   Alcohol none   Full Code   :  Pertinent items are noted in HPI.  Exam: Patient Vitals for the past 24 hrs:  BP Temp Temp src Pulse Resp SpO2  02/19/17 1201 114/83 98.4 F (36.9 C) Axillary (!) 47 (!) 21 100 %  02/19/17 0819 (!) 126/94 98.6 F (37 C) Oral (!) 46 19 100 %  02/19/17 0600 - - - (!) 40 16 100 %  02/19/17 0505 (!) 111/91 - - (!) 46 16 100 %  02/19/17 0500 - - - (!) 32 18 100 %  02/19/17 0400 117/90 - - (!) 41 15 100 %  02/19/17 0350 108/85 97.9 F (36.6 C) Oral (!) 45 15 100 %  02/19/17 0300 128/86 - - (!) 50 14 100 %  02/19/17 0200 (!) 135/94 - - (!) 39  13 100 %  02/19/17 0130 (!) 123/93 97.6 F (36.4 C) Oral (!) 53 16 100 %  02/19/17 0100 (!) 127/93 - - (!) 43 17 100 %  02/19/17 0030 - - - (!) 44 17 100 %  02/19/17 0000 (!) 129/96 - - (!) 51 14 100 %  02/18/17 2330 - - - (!) 47 17 100 %  02/18/17 2300 116/89 97.8 F (36.6 C) Axillary (!) 49 17 100 %  02/18/17 2230 - - - (!) 50 16 100 %  02/18/17 2211 106/82 (!) 97.3 F (36.3 C) Oral 60 (!) 23 100 %  02/18/17 2200 102/76 - - (!) 57 11 100 %  02/18/17 2156 102/76 (!) 97.3 F (36.3 C) Oral (!) 59 17 100 %  02/18/17 2100 - - - 62 18 100 %  02/18/17 2000 - - - 69 18 100 %  02/18/17 1938 118/86 (!) 97.3 F (36.3 C) Oral 65 15 100 %  02/18/17 1900 124/90 - - 67 15 100 %  02/18/17 1810 112/80 (!) 97.3 F (36.3 C) Axillary - - -  02/18/17 1644 115/89 97.8 F (36.6 C) Oral (!) 46 15 100 %  02/18/17 1630 112/80 98 F (36.7 C) Oral (!) 51 15 100 %  02/18/17 1615 - 97.8 F (36.6 C) Oral (!) 51 14 100 %  02/18/17 1608 106/78 - - (!) 54 14 100 %  02/18/17 1600 110/80 - - (!) 55 15 100 %  02/18/17 1542 - 98.3 F (36.8 C) Oral - - -    As above    Recent Labs  02/18/17 1944 02/19/17 0335  WBC 3.4* 3.4*  HGB 8.1*  8.1* 9.7*  HCT 25.6*  25.9* 29.9*  PLT 115* 108*    Recent Labs  02/18/17 0611 02/19/17 0335  NA 136 137  K 3.6 3.6  CL 115* 111  CO2 17* 20*  GLUCOSE 96 86  BUN 23* 15  CREATININE 0.54* 0.53*  CALCIUM 8.1*  8.4*    Blood smear review:  See my blood smear review in HPI.  Pathology: None     Assessment and Plan:  Mr. Foerster is a 31 year old African-American male. [name] HIV positive. He would be classified as AIDS. 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  He has very little bone marrow response by his virtually "null" reticulocyte count.  His blood smear does not look like anything obvious for infiltrative disease.  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 Again, the only way we are going to know this would be a bone marrow test.  He certainly is at risk for a bone marrow disorder by virtue of his incredibly low CD4 count and the fact that his HIV is non-treated.  It will be interesting to see what his erythropoietin level is. I would think that this would be quite high.  I think the only way to improve his anemia is to treat the HIV.  Given all that is going on, I'm not sure that he can be put through a bone marrow test right now. However, this, to me, would be the definitive test.  For now, I would just transfuse as indicated. He's not that old so he probably can withstand a low hemoglobin.  I am not sure how much he really understood what I was saying. He really did not say anything when I was talking with him.  I will pray hard for him. He certainly has a lot of "  body art".  We will follow along and try to help out as we can. Again, I think that the definitive test will be a bone marrow biopsy. I would definitely make sure that cultures are sent out for bacteria, viral, fungal, mycobacterial causes.  Lattie Haw, MD  Jeneen Rinks 1:5-7

## 2017-02-19 NOTE — Progress Notes (Signed)
Shift note:  Patient was bradycardic last night when resting with his eyes closed.  Patient appeared asymptomatic and continued to monitor throughout the shift.  Hgb did increase after second unit of PRBC's finished earlier this am

## 2017-02-20 DIAGNOSIS — D61818 Other pancytopenia: Secondary | ICD-10-CM

## 2017-02-20 LAB — CBC WITH DIFFERENTIAL/PLATELET
BASOS ABS: 0 10*3/uL (ref 0.0–0.1)
Basophils Relative: 1 %
Eosinophils Absolute: 0.2 10*3/uL (ref 0.0–0.7)
Eosinophils Relative: 6 %
HEMATOCRIT: 31.5 % — AB (ref 39.0–52.0)
HEMOGLOBIN: 10.3 g/dL — AB (ref 13.0–17.0)
LYMPHS ABS: 0.7 10*3/uL (ref 0.7–4.0)
Lymphocytes Relative: 22 %
MCH: 27.9 pg (ref 26.0–34.0)
MCHC: 32.7 g/dL (ref 30.0–36.0)
MCV: 85.4 fL (ref 78.0–100.0)
Monocytes Absolute: 0.3 10*3/uL (ref 0.1–1.0)
Monocytes Relative: 10 %
NEUTROS ABS: 1.9 10*3/uL (ref 1.7–7.7)
Neutrophils Relative %: 62 %
Platelets: 99 10*3/uL — ABNORMAL LOW (ref 150–400)
RBC: 3.69 MIL/uL — AB (ref 4.22–5.81)
RDW: 15.4 % (ref 11.5–15.5)
WBC: 3.1 10*3/uL — AB (ref 4.0–10.5)

## 2017-02-20 LAB — PROCALCITONIN

## 2017-02-20 LAB — CULTURE, BLOOD (ROUTINE X 2)
Culture: NO GROWTH
Culture: NO GROWTH
SPECIAL REQUESTS: ADEQUATE
Special Requests: ADEQUATE

## 2017-02-20 LAB — RETICULOCYTES
RBC.: 3.69 MIL/uL — AB (ref 4.22–5.81)
Retic Ct Pct: <0.4 % — ABNORMAL LOW (ref 0.4–3.1)

## 2017-02-20 LAB — LACTATE DEHYDROGENASE: LDH: 178 U/L (ref 98–192)

## 2017-02-20 NOTE — Progress Notes (Signed)
[facility]   Reason for visit: Follow up on Cryptococcal meningitis  Interval History: repeat LP again 7/21 with normal opening pressure; no new issues, no fever.  No response from patient.  No acute events  Physical Exam: Constitutional:  Vitals:   02/20/17 0355 02/20/17 0831  BP: 103/70 108/71  Pulse: (!) 40 (!) 42  Resp: 16 16  Temp: 98 F (36.7 C) (!) 97.5 F (36.4 C)   patient alert but no response Eyes: anicteric Respiratory: Normal respiratory effort; CTA B Cardiovascular: RRR GI: soft, nt, nd  Review of Systems: Unable to be assessed due to mental status  Lab Results  Component Value Date   WBC 3.1 (L) 02/20/2017   HGB 10.3 (L) 02/20/2017   HCT 31.5 (L) 02/20/2017   MCV 85.4 02/20/2017   PLT 99 (L) 02/20/2017    Lab Results  Component Value Date   CREATININE 0.53 (L) 02/19/2017   BUN 15 02/19/2017   NA 137 02/19/2017   K 3.6 02/19/2017   CL 111 02/19/2017   CO2 20 (L) 02/19/2017    Lab Results  Component Value Date   ALT 31 02/16/2017   AST 35 02/16/2017   ALKPHOS 94 02/16/2017     Microbiology: Recent Results (from the past 240 hour(s))  Culture, blood (Routine x 2)     Status: None (Preliminary result)   Collection Time: 02/15/17 12:45 PM  Result Value Ref Range Status   Specimen Description BLOOD LEFT ANTECUBITAL  Final   Special Requests   Final    BOTTLES DRAWN AEROBIC ONLY Blood Culture adequate volume   Culture NO GROWTH 4 DAYS  Final   Report Status PENDING  Incomplete  Culture, blood (Routine x 2)     Status: None (Preliminary result)   Collection Time: 02/15/17 12:51 PM  Result Value Ref Range Status   Specimen Description BLOOD RIGHT ANTECUBITAL  Final   Special Requests IN PEDIATRIC BOTTLE Blood Culture adequate volume  Final   Culture NO GROWTH 4 DAYS  Final   Report Status PENDING  Incomplete  CSF culture     Status: None   Collection Time: 02/16/17  1:29 PM  Result Value Ref Range Status   Specimen Description CSF  Final   Special Requests NONE  Final   Gram Stain   Final    WBC PRESENT, PREDOMINANTLY PMN YEAST CYTOSPIN SMEAR CRITICAL RESULT CALLED TO, READ BACK BY AND VERIFIED WITH: Candie Chroman RN AT 1433 ON [id number] BY SJW    Culture NO GROWTH 3 DAYS  Final   Report Status 02/19/2017 FINAL  Final  MRSA PCR Screening     Status: None   Collection Time: 02/16/17  2:44 PM  Result Value Ref Range Status   MRSA by PCR NEGATIVE NEGATIVE Final    Comment:        The GeneXpert MRSA Assay (FDA approved for NASAL specimens only), is one component of a comprehensive MRSA colonization surveillance program. It is not intended to diagnose MRSA infection nor to guide or monitor treatment for MRSA infections.   CSF culture     Status: None (Preliminary result)   Collection Time: 02/18/17  2:00 PM  Result Value Ref Range Status   Specimen Description CSF  Final   Special Requests TUBE 2  Final   Gram Stain   Final    WBC PRESENT,BOTH PMN AND MONONUCLEAR YEAST CYTOSPIN SMEAR CRITICAL RESULT CALLED TO, READ BACK BY AND VERIFIED WITH:  A. RODRIGUEZ RN, AT 3005 02/18/17 BY D. VANHOOK    Culture NO GROWTH < 24 HOURS  Final   Report Status PENDING  Incomplete  Culture, fungus without smear     Status: None (Preliminary result)   Collection Time: 02/18/17  2:00 PM  Result Value Ref Range Status   Specimen Description CSF  Final   Special Requests TUBE 2  Final   Culture NO FUNGUS ISOLATED AFTER 2 DAYS  Final   Report Status PENDING  Incomplete    Impression/Plan:  1. 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  He will need prolonged fluconazole the next 6-12 months.    2. Increased ICP - normalized after one tap.  I discussed with Dr. Leonel Ramsay and agree he should get a new LP on Wedensday 7/15 and if ICP remains normal to repeat the LP weekly for the next 2 weeks.  If it remains normal,  then will stop.  If the ICP increases significantly though, will need to discuss the possibility of VPS with Dr. Kathyrn Sheriff who saw the patient previously.    3.  Pancytopenia - I suspect bone marrow suppression from untreated HIV/AIDS  4.  HIV/AIDS - unable at this time to start ARVs, though obviously will be critical to his survival.  The concern is #2 which can worsen due to IRIS with starting ARVs but will conside if ICPs remain wnl.

## 2017-02-20 NOTE — Progress Notes (Signed)
CSW continuing to follow for return to SNF when stable- facility sent updates for review  Jorge Ny, [place] Social Worker [phone]

## 2017-02-20 NOTE — Progress Notes (Addendum)
[facility]                                                                              Patient Demographics  William Pena, is a 31 y.o. male, DOB - 27-Nov-1985, MGN:[id number]  Admit date - 02/15/2017   Admitting Physician Donne Hazel, MD  Outpatient Primary MD for the patient is [name], [name], MD  Outpatient specialists:   LOS - 5  days   Medical records reviewed and are as summarized below:    Chief Complaint  Patient presents with  . Altered Mental Status       Brief summary   Patient is a 31 year old male with AIDS, recent admission for perirectal abscess, development of cryptococcal meningoencephalitis, recent CVA,  ARF, severe protein calorie malnutrition who presents to the ED from facility with increased mental status change. Patient was in usual state of health. On recent discharge (dated 7/9), patient was recommended by ID to continue fluconazole x 4 more weeks before resuming antiviral meds for HIV. Patient is non-verbal at baseline. Patient's mother reported patient's poor PO intake recently.   Assessment & Plan    Principal Problem:   Sepsis ([place]) In the setting of cryptococcal meningitis - Patient met sepsis criteria given temp of 102.9, respiratory rate of 28, tachycardia 120s to 130s, BP borderline - Patient also had recent cryptococcal meningitis in the setting of AIDS. - continue IV fluconazole, continue for 8 more weeks prior to transitioning to 200 mg daily for next 6-12 months   - Serial LPs per neurology, next LP on 7/25. Per neurology if ICP remains normal then repeat LP weekly for next 2 weeks and then stop if ICP normal. However if ICP increases significantly, then will need shunt. Neurology had signed off on 7/22, will need to be reconsulted on 7/25 for LP  Active Problems:   AIDS (acquired immune deficiency syndrome) ([place]) -  hold off on antiretrovirals, defer starting another 4 weeks due to high opening pressure, concern for  worsening due to IRIS - continue holding Bactrim and Zithromax for now    Encephalopathy acute - still encephalopathic, likely due to # 1. 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  -H&H currently stable after transfusion, 10.3    Code Status: Full CODE STATUS  DVT Prophylaxis: SCDs  Family Communication: No family member at the bedside today  Disposition Plan  Time Spent in minutes 35 minutes  Procedures:  LP  Consultants:   Infectious disease Neurology Hematology oncology  Antimicrobials:   Fluconazole 7/19   Medications  Scheduled Meds: . amantadine  100 mg Oral BID  WC  . feeding supplement (ENSURE ENLIVE)  237 mL Oral BID BM  . feeding supplement (PRO-STAT SUGAR FREE 64)  30 mL Oral BID  . multivitamin with minerals  1 tablet Oral Daily  . nystatin  5 mL Oral QID  . tamsulosin  0.4 mg Oral Daily   Continuous Infusions: . sodium chloride 50 mL/hr at 02/20/17 0721  . fluconazole (DIFLUCAN) IV Stopped (02/20/17 1408)   PRN Meds:.acetaminophen **OR** acetaminophen, HYDROcodone-acetaminophen, polyethylene glycol, polyvinyl alcohol   Antibiotics   Anti-infectives    Start     Dose/Rate Route Frequency Ordered Stop   02/17/17 1400  levofloxacin (LEVAQUIN) IVPB 750 mg  Status:  Discontinued     750 mg 100 mL/hr over 90 Minutes Intravenous Every 48 hours 02/15/17 1515 02/16/17 1043    02/17/17 1000  azithromycin (ZITHROMAX) tablet 1,200 mg  Status:  Discontinued     1,200 mg Oral Every Fri 02/15/17 1615 02/16/17 1308   02/16/17 1330  ceFEPIme (MAXIPIME) 2 g in dextrose 5 % 50 mL IVPB  Status:  Discontinued     2 g 100 mL/hr over 30 Minutes Intravenous Every 24 hours 02/15/17 1515 02/16/17 1043   02/16/17 1200  fluconazole (DIFLUCAN) IVPB 400 mg     400 mg 100 mL/hr over 120 Minutes Intravenous Every 24 hours 02/16/17 1048     02/16/17 1000  fluconazole (DIFLUCAN) tablet 400 mg  Status:  Discontinued     400 mg Oral Daily 02/15/17 1615 02/16/17 1048   02/16/17 1000  sulfamethoxazole-trimethoprim (BACTRIM DS,SEPTRA DS) 800-160 MG per tablet 1 tablet  Status:  Discontinued     1 tablet Oral Daily 02/15/17 1615 02/16/17 1308   02/16/17 0200  vancomycin (VANCOCIN) 500 mg in sodium chloride 0.9 % 100 mL IVPB  Status:  Discontinued     500 mg 100 mL/hr over 60 Minutes Intravenous Every 12 hours 02/15/17 1515 02/16/17 1043   02/15/17 1315  ceFEPIme (MAXIPIME) 2 g in dextrose 5 % 50 mL IVPB     2 g 100 mL/hr over 30 Minutes Intravenous  Once 02/15/17 1307 02/15/17 1436   02/15/17 1300  levofloxacin (LEVAQUIN) IVPB 750 mg     750 mg 100 mL/hr over 90 Minutes Intravenous  Once 02/15/17 1254 02/15/17 1533   02/15/17 1300  aztreonam (AZACTAM) 2 g in dextrose 5 % 50 mL IVPB  Status:  Discontinued     2 g 100 mL/hr over 30 Minutes Intravenous  Once 02/15/17 1254 02/15/17 1307   02/15/17 1300  vancomycin (VANCOCIN) IVPB 1000 mg/200 mL premix     1,000 mg 200 mL/hr over 60 Minutes Intravenous  Once 02/15/17 1254 02/15/17 1436        Subjective:   William Pena was seen and examined today. Alert and awake, nonverbal. No family member at the bedside today,  no fevers or chills..   Objective:   Vitals:   02/19/17 1947 02/19/17 2322 02/20/17 0355 02/20/17 0831  BP: 110/78 119/84 103/70 108/71  Pulse: (!) 50 (!) 51 (!) 40 (!) 42  Resp: _0 Temp: 100 F (37.8 C)  98.7 F (37.1 C) 98 F (36.7 C) (!) 97.5 F (36.4 C)  TempSrc: Axillary Axillary Oral Axillary  SpO2: 99% 98% 95% 98%  Weight:      Height:        Intake/Output Summary (Last 24 hours) at 02/20/17 1217 Last data filed at 02/20/17 3500  Gross per 24 hour  Intake  990 ml  Output             2950 ml  Net            -1960 ml     Wt Readings from Last 3 Encounters:  02/16/17 57.1 kg (125 lb 12.8 oz)  02/07/17 61.7 kg (136 lb)     Exam   General:Alert and awake, appears comfortable   Eyes:   HEENT:    Cardiovascular: S1-S2 clear   Respiratory:CTA B   Gastrointestinal:Soft, NT, ND, NBS   Ext:no pedal edema bilaterally, heelsin protectors   Neuro:right-sided hemiparesis   Musculoskeletal:No cyanosis or clubbing   Skin:Multiple tattoos   Psych:    Data Reviewed:  I have personally reviewed following labs and imaging studies  Micro Results Recent Results (from the past 240 hour(s))  Culture, blood (Routine x 2)     Status: None (Preliminary result)   Collection Time: 02/15/17 12:45 PM  Result Value Ref Range Status   Specimen Description BLOOD LEFT ANTECUBITAL  Final   Special Requests   Final    BOTTLES DRAWN AEROBIC ONLY Blood Culture adequate volume   Culture NO GROWTH 4 DAYS  Final   Report Status PENDING  Incomplete  Culture, blood (Routine x 2)     Status: None (Preliminary result)   Collection Time: 02/15/17 12:51 PM  Result Value Ref Range Status   Specimen Description BLOOD RIGHT ANTECUBITAL  Final   Special Requests IN PEDIATRIC BOTTLE Blood Culture adequate volume  Final   Culture NO GROWTH 4 DAYS  Final   Report Status PENDING  Incomplete  CSF culture     Status: None   Collection Time: 02/16/17  1:29 PM  Result Value Ref Range Status   Specimen Description CSF  Final   Special Requests NONE  Final   Gram Stain   Final    WBC PRESENT, PREDOMINANTLY PMN YEAST CYTOSPIN SMEAR CRITICAL RESULT CALLED TO, READ BACK BY AND VERIFIED  WITH: Candie Chroman RN AT [id number] ON [id number] BY SJW    Culture NO GROWTH 3 DAYS  Final   Report Status 02/19/2017 FINAL  Final  MRSA PCR Screening     Status: None   Collection Time: 02/16/17  2:44 PM  Result Value Ref Range Status   MRSA by PCR NEGATIVE NEGATIVE Final    Comment:        The GeneXpert MRSA Assay (FDA approved for NASAL specimens only), is one component of a comprehensive MRSA colonization surveillance program. It is not intended to diagnose MRSA infection nor to guide or monitor treatment for MRSA infections.   CSF culture     Status: None (Preliminary result)   Collection Time: 02/18/17  2:00 PM  Result Value Ref Range Status   Specimen Description CSF  Final   Special Requests TUBE 2  Final   Gram Stain   Final    WBC PRESENT,BOTH PMN AND MONONUCLEAR YEAST CYTOSPIN SMEAR CRITICAL RESULT CALLED TO, READ BACK BY AND VERIFIED WITH: A. RODRIGUEZ RN, AT 7124 02/18/17 BY D. VANHOOK    Culture NO GROWTH 2 DAYS  Final   Report Status PENDING  Incomplete  Culture, fungus without smear     Status: None (Preliminary result)   Collection Time: 02/18/17  2:00 PM  Result Value Ref Range Status   Specimen Description CSF  Final   Special Requests TUBE 2  Final   Culture NO FUNGUS ISOLATED AFTER 2 DAYS  Final   Report Status  PENDING  Incomplete    Radiology Reports Dg Chest 1 View  Result Date: 02/15/2017 CLINICAL DATA:  Respiratory failure.  HIV disease. EXAM: CHEST 1 VIEW COMPARISON:  January 08, 2017 FINDINGS: There is no edema or consolidation. Heart size and pulmonary vascularity are normal. No adenopathy. No evident bone lesions. IMPRESSION: No edema or consolidation. Electronically Signed   By: Lowella Grip III M.D.   On: 02/15/2017 13:30   Ct Head Wo Contrast  Result Date: 02/15/2017 CLINICAL DATA:  Initial evaluation for acute altered mental status, history of meningitis with small vessel infarcts. EXAM: CT HEAD WITHOUT CONTRAST TECHNIQUE: Contiguous axial images  were obtained from the base of the skull through the vertex without intravenous contrast. COMPARISON:  Comparison made with recent CT from 01/06/2017 as well as brain MRI from 12/27/2016. FINDINGS: Brain: Stable cerebral volume. There has been interval evolution of previously identified infarctions related to meningitis, with progressive hypodensities seen at the level of the left caudate and lentiform nuclei, as well as the cortical gray matter and underlying subcortical/deep white matter of the bilateral frontal lobes near the vertex (series 3, image 24). These areas were seen to be involved on previous brain MRI. No associated mass effect or hemorrhagic transformation. There is a probable new insult with 1 cm hypodensity seen at the right globus pallidus (series 3, image 15), not definitely seen on previous. No associated hemorrhage or mass effect. No other acute large vessel territory infarct. No acute intracranial hemorrhage. No mass lesion or midline shift. No mass effect. No hydrocephalus. No extra-axial fluid collection. Vascular: No hyperdense vessel. Skull: Scalp soft tissues and calvarium within normal limits. Sinuses/Orbits: Globes and orbital soft tissues within normal limits. Mild scattered mucoperiosteal thickening within the maxillary sinuses and right sphenoid sinus. Visualized paranasal sinuses are otherwise clear. Mastoids and middle ear cavities are clear. IMPRESSION: 1. 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 New 1 cm hypodensity at the right globus pallidus suspicious for new/interval acute/subacute infarction. 2. No evidence for intracranial hemorrhage or other complication. Electronically Signed   By: Jeannine Boga M.D.   On: 02/15/2017 17:31   Dg Fluoro Guide Lumbar Puncture  Result Date: 02/18/2017 CLINICAL DATA:  Cryptococcal meningoencephalitis  EXAM: DIAGNOSTIC LUMBAR PUNCTURE UNDER FLUOROSCOPIC GUIDANCE FLUOROSCOPY TIME:  Fluoroscopy Time:  12 seconds Radiation Exposure Index (if provided by the fluoroscopic device): 0.7 mGy Number of Acquired Spot Images: 1 screen capture PROCEDURE: Informed consent was obtained from the patient's mother prior to the procedure, including potential complications of headache, allergy, and pain. With the patient prone, the lower back was prepped with Betadine. 1% Lidocaine was used for local anesthesia. Lumbar puncture was performed at the L4-5 level using a 20 gauge needle with return of clear CSF with an opening pressure of 19 cm water. 5.5 ml of CSF were obtained for laboratory studies. The patient tolerated the procedure well and there were no apparent complications. IMPRESSION: Successful lumbar puncture, as described above. Opening pressure 19 cm water, within the upper limits of normal range. Electronically Signed   By: Julian Hy M.D.   On: 02/18/2017 14:45   Dg Fluoro Guide Lumbar Puncture  Result Date: 02/16/2017 CLINICAL DATA:  HNP, cryptococcal meningitis.  Altered mental status EXAM: DIAGNOSTIC LUMBAR PUNCTURE UNDER FLUOROSCOPIC GUIDANCE FLUOROSCOPY TIME:  Fluoroscopy Time:  0 minutes 12 second Radiation Exposure Index (if provided by the fluoroscopic device): Number of Acquired Spot  Images: 0 PROCEDURE: Informed consent obtained from the patient's mother who signed the form. Informed consent was obtained prior to the procedure, including potential complications of headache, allergy, and pain. With the patient prone, the lower back was prepped with Betadine. 1% Lidocaine was used for local anesthesia. Lumbar puncture was performed at the L3-4 level using a 20 gauge needle with return of clear CSF with an opening pressure of 44 cm water. Twenty-five ml of CSF were obtained for laboratory studies. Closing pressure 13 cm H2O. The patient tolerated the procedure well and there were no apparent  complications. IMPRESSION: Successful lumbar puncture with elevated opening pressure 44 cm water Electronically Signed   By: Franchot Gallo M.D.   On: 02/16/2017 14:10   Dg Fluoro Guide Lumbar Puncture  Result Date: 01/30/2017 CLINICAL DATA:  HIV. EXAM: DIAGNOSTIC LUMBAR PUNCTURE UNDER FLUOROSCOPIC GUIDANCE FLUOROSCOPY TIME:  Fluoroscopy Time:  1 minutes and 12 seconds Radiation Exposure Index (if provided by the fluoroscopic device): Not applicable. Number of Acquired Spot Images: 1 PROCEDURE: Informed consent was obtained from the patient's mother prior to the procedure, including potential complications of headache, allergy, and pain. With the patient prone, the lower back was prepped with Betadine. 1% Lidocaine was used for local anesthesia. Lumbar puncture was performed at the L2-3 level using a 20 gauge needle with return of clear CSF with an opening pressure of 9 cm water. 3.5 ml of CSF were obtained for laboratory studies. The patient tolerated the procedure well and there were no apparent complications. Further fluid could not be obtained secondary to patient's clinical status and low pressure. Closing pressure was not performed secondary to low pressure throughout. IMPRESSION: Non complicated lumbar puncture as detailed above. Electronically Signed   By: Abigail Miyamoto M.D.   On: 01/30/2017 16:26   Dg Fluoro Guide Lumbar Puncture  Result Date: 01/25/2017 Etheleen Mayhew, MD     01/25/2017  1:46 PM Lumbar puncture performed at L2-L3.  Opening pressure 22 cm H20.  22 mL clear CSF obtained and sent to lab for testing.  Please see full dictation in PACS for additional details.    Lab Data:  CBC:  Recent Labs Lab 02/15/17 1232  02/17/17 0314 02/17/17 2122 02/18/17 0611 02/18/17 1944 02/19/17 0335 02/20/17 0359  WBC 5.4  < > 4.8  --  3.7* 3.4* 3.4* 3.1*  NEUTROABS 4.3  --   --   --   --  2.7  --  1.9  HGB 7.6*  < > 7.4*  --  7.0* 8.1*  8.1* 9.7* 10.3*  HCT 22.5*  < > 23.6*  --  22.0*  25.6*  25.9* 29.9* 31.5*  MCV 84.6  < > 88.4  --  87.0 87.4 86.4 85.4  PLT 316  < > 109* 113* 121* 115* 108* 99*  < > = values in this interval not displayed. Basic Metabolic Panel:  Recent Labs Lab 02/15/17 1413 02/16/17 0518 02/17/17 0314 02/18/17 0611 02/19/17 0335  NA 134* 138 141 136 137  K 4.2 4.3 3.8 3.6 3.6  CL 109 115* 118* 115* 111  CO2 19* 14* 15* 17* 20*  GLUCOSE 122* 87 90 96 86  BUN 50* 46* 35* 23* 15  CREATININE 2.09* 1.54* 0.92 0.54* 0.53*  CALCIUM 8.5* 8.4* 8.7* 8.1* 8.4*   GFR: Estimated Creatinine Clearance: 108.1 mL/min (A) (by C-G formula based on SCr of 0.53 mg/dL (L)). Liver Function Tests:  Recent Labs Lab 02/16/17 0518  AST 35  ALT 31  ALKPHOS 94  BILITOT 0.6  PROT 7.6  ALBUMIN 2.0*   No results for input(s): LIPASE, AMYLASE in the last 168 hours. No results for input(s): AMMONIA in the last 168 hours. Coagulation Profile:  Recent Labs Lab 02/17/17 2122  INR 1.20   Cardiac Enzymes: No results for input(s): CKTOTAL, CKMB, CKMBINDEX, TROPONINI in the last 168 hours. BNP (last 3 results) No results for input(s): PROBNP in the last 8760 hours. HbA1C: No results for input(s): HGBA1C in the last 72 hours. CBG:  Recent Labs Lab 02/16/17 1605  GLUCAP 84   Lipid Profile: No results for input(s): CHOL, HDL, LDLCALC, TRIG, CHOLHDL, LDLDIRECT in the last 72 hours. Thyroid Function Tests: No results for input(s): TSH, T4TOTAL, FREET4, T3FREE, THYROIDAB in the last 72 hours. Anemia Panel:  Recent Labs  02/17/17 2122 02/20/17 0359  VITAMINB12 638  --   FOLATE 13.7  --   FERRITIN 1,433*  --   TIBC 106*  --   IRON 48  --   RETICCTPCT <0.4* <0.4*   Urine analysis:    Component Value Date/Time   COLORURINE YELLOW 02/15/2017 1342   APPEARANCEUR HAZY (A) 02/15/2017 1342   LABSPEC 1.021 02/15/2017 1342   PHURINE 6.0 02/15/2017 1342   GLUCOSEU NEGATIVE 02/15/2017 1342   HGBUR SMALL (A) 02/15/2017 1342   BILIRUBINUR NEGATIVE  02/15/2017 1342   KETONESUR NEGATIVE 02/15/2017 1342   PROTEINUR 30 (A) 02/15/2017 1342   NITRITE POSITIVE (A) 02/15/2017 1342   LEUKOCYTESUR TRACE (A) 02/15/2017 1342     Cherylyn Sundby M.D. [facility] 02/20/2017, 12:17 PM  Pager: [phone] Between 7am to 7pm - call Pager - [phone]  After 7pm go to [email or web address] - password [email or web address]  Call night coverage person covering after 7pm

## 2017-02-21 LAB — CBC WITH DIFFERENTIAL/PLATELET
BASOS PCT: 0 %
Basophils Absolute: 0 10*3/uL (ref 0.0–0.1)
Eosinophils Absolute: 0.1 10*3/uL (ref 0.0–0.7)
Eosinophils Relative: 4 %
HEMATOCRIT: 32.6 % — AB (ref 39.0–52.0)
HEMOGLOBIN: 10.5 g/dL — AB (ref 13.0–17.0)
LYMPHS ABS: 0.7 10*3/uL (ref 0.7–4.0)
Lymphocytes Relative: 29 %
MCH: 27.3 pg (ref 26.0–34.0)
MCHC: 32.2 g/dL (ref 30.0–36.0)
MCV: 84.9 fL (ref 78.0–100.0)
MONOS PCT: 14 %
Monocytes Absolute: 0.3 10*3/uL (ref 0.1–1.0)
NEUTROS ABS: 1.2 10*3/uL — AB (ref 1.7–7.7)
NEUTROS PCT: 53 %
Platelets: 115 10*3/uL — ABNORMAL LOW (ref 150–400)
RBC: 3.84 MIL/uL — AB (ref 4.22–5.81)
RDW: 15.4 % (ref 11.5–15.5)
WBC: 2.3 10*3/uL — AB (ref 4.0–10.5)

## 2017-02-21 LAB — RETICULOCYTES: RBC.: 3.84 MIL/uL — AB (ref 4.22–5.81)

## 2017-02-21 LAB — CSF CULTURE: CULTURE: NO GROWTH

## 2017-02-21 NOTE — Progress Notes (Signed)
[facility]                                                                              Patient Demographics  William Pena, is a 31 y.o. male, DOB - 24-Apr-1986, GMW:[id number]  Admit date - 02/15/2017   Admitting Physician Donne Hazel, MD  Outpatient Primary MD for the patient is [name], [name], MD  Outpatient specialists:   LOS - 6  days   Medical records reviewed and are as summarized below:    Chief Complaint  Patient presents with  . Altered Mental Status       Brief summary   Patient is a 31 year old male with AIDS, recent admission for perirectal abscess, development of cryptococcal meningoencephalitis, recent CVA,  ARF, severe protein calorie malnutrition who presents to the ED from facility with increased mental status change. Patient was in usual state of health. On recent discharge (dated 7/9), patient was recommended by ID to continue fluconazole x 4 more weeks before resuming antiviral meds for HIV. Patient is non-verbal at baseline. Patient's mother reported patient's poor PO intake recently.   Assessment & Plan    Principal Problem:   Sepsis ([place]) In the setting of cryptococcal meningitis - Patient met sepsis criteria given temp of 102.9, respiratory rate of 28, tachycardia 120s to 130s, BP borderline - Patient also had recent cryptococcal meningitis in the setting of AIDS, was recently discharged from our facility on 7/9 (complicated hospital stay from 5/22- 02/06/2017). - continue IV fluconazole, continue for 8 more weeks prior to transitioning to 200 mg daily. He will need fluconazole for next 6-12 months   - Serial LPs per neurology, next LP on 7/25. Per neurology if ICP remains normal, then repeat LP weekly for next 2 weeks and then stop if ICP normal. However if ICP increases significantly, then he will need shunt. Neurology had signed off on 7/22, will need to be reconsulted on 7/25 for LP.  - Talked to neuro, recommended IR do LP  tomorrow, ordered.   Active Problems:   AIDS (acquired immune deficiency syndrome) ([place]) -  hold off on antiretrovirals, defer starting for another 4 weeks due to high opening pressure, concern for worsening due to IRIS - continue holding Bactrim and Zithromax for now    Encephalopathy acute - still encephalopathic, likely due to # 1. 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  -H&H currently stable after transfusion, 10.5 today  Goals of care Discussed in detail with patient's mother at the bedside. Patient's mother has been overwhelmed with his recent  prolonged stay and with his current stay, tearful during the conversation. Patient has a long-standing history of HIV since he was 31 years old, history of being incarcerated, noncompliance with his medications. I updated her with the current management and we discussed about palliative care. She feels overwhelmed having to make the decisions and is agreeable to the idea of palliative care support and having the other family members also understand the hard choices like artificial feeding, code status etc.  I have put in the palliative care consult to discuss the goals of care with patient's mother and other family members who she wants to include (patient's father, brother).   Code Status: Full CODE STATUS  DVT  Prophylaxis: SCDs  Family Communication: Patient's mother, see above  Disposition Plan  Time Spent in minutes 35 minutes  Procedures:  LP  Consultants:   Infectious disease Neurology Hematology oncology  Antimicrobials:   Fluconazole 7/19   Medications  Scheduled Meds: . amantadine  100 mg Oral BID WC  . feeding supplement (ENSURE ENLIVE)  237 mL Oral BID BM  . feeding supplement (PRO-STAT SUGAR FREE 64)  30 mL Oral BID  . multivitamin with minerals  1 tablet Oral Daily  . nystatin  5 mL Oral QID  . tamsulosin  0.4 mg Oral Daily   Continuous Infusions: . sodium chloride 50 mL/hr at 02/21/17 0511  . fluconazole (DIFLUCAN) IV 400 mg (02/21/17 1152)   PRN Meds:.acetaminophen **OR** acetaminophen, HYDROcodone-acetaminophen, polyethylene glycol, polyvinyl alcohol   Antibiotics   Anti-infectives    Start     Dose/Rate Route Frequency Ordered Stop   02/17/17 1400  levofloxacin (LEVAQUIN) IVPB 750 mg  Status:  Discontinued     750 mg 100 mL/hr over 90 Minutes Intravenous Every 48 hours 02/15/17 1515 02/16/17 1043   02/17/17 1000  azithromycin (ZITHROMAX) tablet 1,200 mg  Status:  Discontinued     1,200 mg Oral Every Fri 02/15/17 1615 02/16/17 1308   02/16/17 1330  ceFEPIme (MAXIPIME) 2 g in dextrose 5 % 50 mL IVPB  Status:  Discontinued     2 g 100 mL/hr over 30 Minutes Intravenous Every 24 hours 02/15/17 1515 02/16/17 1043   02/16/17 1200  fluconazole (DIFLUCAN) IVPB 400 mg     400 mg 100 mL/hr over 120 Minutes Intravenous Every 24 hours 02/16/17 1048     02/16/17 1000  fluconazole (DIFLUCAN) tablet 400 mg  Status:  Discontinued     400 mg Oral Daily 02/15/17 1615 02/16/17 1048   02/16/17 1000  sulfamethoxazole-trimethoprim (BACTRIM DS,SEPTRA DS) 800-160 MG per tablet 1 tablet  Status:  Discontinued     1 tablet Oral Daily 02/15/17 1615 02/16/17 1308   02/16/17 0200  vancomycin (VANCOCIN) 500 mg in sodium chloride 0.9 % 100 mL IVPB  Status:  Discontinued     500  mg 100 mL/hr over 60 Minutes Intravenous Every 12 hours 02/15/17 1515 02/16/17 1043   02/15/17 1315  ceFEPIme (MAXIPIME) 2 g in dextrose 5 % 50 mL IVPB     2 g 100 mL/hr over 30 Minutes Intravenous  Once 02/15/17 1307 02/15/17 1436   02/15/17 1300  levofloxacin (LEVAQUIN) IVPB 750 mg     750 mg 100 mL/hr over 90 Minutes Intravenous  Once 02/15/17 1254 02/15/17 1533   02/15/17 1300  aztreonam (AZACTAM) 2 g in dextrose 5 % 50 mL IVPB  Status:  Discontinued     2 g 100 mL/hr over 30 Minutes Intravenous  Once 02/15/17 1254 02/15/17 1307  02/15/17 1300  vancomycin (VANCOCIN) IVPB 1000 mg/200 mL premix     1,000 mg 200 mL/hr over 60 Minutes Intravenous  Once 02/15/17 1254 02/15/17 1436        Subjective:   Shin Lamour was seen and examined today. Patient remains alert and awake however nonverbal, mother at the bedside. No fevers or chills. No acute issues overnight.     Objective:   Vitals:   02/21/17 0000 02/21/17 0326 02/21/17 0730 02/21/17 1158  BP:  128/87 119/81 115/84  Pulse:  (!) 46 72 60  Resp:  '15 17 16  ' Temp: 98.4 F (36.9 C) 97.6 F (36.4 C) 98.1 F (36.7 C) 98 F (36.7 C)  TempSrc: Axillary Axillary Oral Oral  SpO2:  99% 97% 99%  Weight:      Height:        Intake/Output Summary (Last 24 hours) at 02/21/17 1204 Last data filed at 02/21/17 1000  Gross per 24 hour  Intake             1380 ml  Output              975 ml  Net              405 ml     Wt Readings from Last 3 Encounters:  02/16/17 57.1 kg (125 lb 12.8 oz)  02/07/17 61.7 kg (136 lb)     Exam   General: Alert and awake, nonverbal  Eyes:   HEENT:    Cardiovascular: S1 S2 clear, RRR   Respiratory: CTAB  Gastrointestinal: Soft, nontender, nondistended, + bowel sounds  Ext:  Heels in protectors bilaterally  Neuro: right-sided hemiparesis  Musculoskeletal: No digital cyanosis, clubbing  Skin: Multiple tattoos  Psych: alert and awake, nonverbal     Data Reviewed:  I  have personally reviewed following labs and imaging studies  Micro Results Recent Results (from the past 240 hour(s))  Culture, blood (Routine x 2)     Status: None   Collection Time: 02/15/17 12:45 PM  Result Value Ref Range Status   Specimen Description BLOOD LEFT ANTECUBITAL  Final   Special Requests   Final    BOTTLES DRAWN AEROBIC ONLY Blood Culture adequate volume   Culture NO GROWTH 5 DAYS  Final   Report Status 02/20/2017 FINAL  Final  Culture, blood (Routine x 2)     Status: None   Collection Time: 02/15/17 12:51 PM  Result Value Ref Range Status   Specimen Description BLOOD RIGHT ANTECUBITAL  Final   Special Requests IN PEDIATRIC BOTTLE Blood Culture adequate volume  Final   Culture NO GROWTH 5 DAYS  Final   Report Status 02/20/2017 FINAL  Final  CSF culture     Status: None   Collection Time: 02/16/17  1:29 PM  Result Value Ref Range Status   Specimen Description CSF  Final   Special Requests NONE  Final   Gram Stain   Final    WBC PRESENT, PREDOMINANTLY PMN YEAST CYTOSPIN SMEAR CRITICAL RESULT CALLED TO, READ BACK BY AND VERIFIED WITH: Candie Chroman RN AT [id number] ON [id number] BY SJW    Culture NO GROWTH 3 DAYS  Final   Report Status 02/19/2017 FINAL  Final  MRSA PCR Screening     Status: None   Collection Time: 02/16/17  2:44 PM  Result Value Ref Range Status   MRSA by PCR NEGATIVE NEGATIVE Final    Comment:        The GeneXpert MRSA  Assay (FDA approved for NASAL specimens only), is one component of a comprehensive MRSA colonization surveillance program. It is not intended to diagnose MRSA infection nor to guide or monitor treatment for MRSA infections.   CSF culture     Status: None (Preliminary result)   Collection Time: 02/18/17  2:00 PM  Result Value Ref Range Status   Specimen Description CSF  Final   Special Requests TUBE 2  Final   Gram Stain   Final    WBC PRESENT,BOTH PMN AND MONONUCLEAR YEAST CYTOSPIN SMEAR CRITICAL RESULT CALLED TO, READ BACK BY AND  VERIFIED WITH: A. RODRIGUEZ RN, AT 4008 02/18/17 BY D. VANHOOK    Culture NO GROWTH 3 DAYS  Final   Report Status PENDING  Incomplete  Culture, fungus without smear     Status: None (Preliminary result)   Collection Time: 02/18/17  2:00 PM  Result Value Ref Range Status   Specimen Description CSF  Final   Special Requests TUBE 2  Final   Culture NO FUNGUS ISOLATED AFTER 2 DAYS  Final   Report Status PENDING  Incomplete    Radiology Reports Dg Chest 1 View  Result Date: 02/15/2017 CLINICAL DATA:  Respiratory failure.  HIV disease. EXAM: CHEST 1 VIEW COMPARISON:  January 08, 2017 FINDINGS: There is no edema or consolidation. Heart size and pulmonary vascularity are normal. No adenopathy. No evident bone lesions. IMPRESSION: No edema or consolidation. Electronically Signed   By: Lowella Grip III M.D.   On: 02/15/2017 13:30   Ct Head Wo Contrast  Result Date: 02/15/2017 CLINICAL DATA:  Initial evaluation for acute altered mental status, history of meningitis with small vessel infarcts. EXAM: CT HEAD WITHOUT CONTRAST TECHNIQUE: Contiguous axial images were obtained from the base of the skull through the vertex without intravenous contrast. COMPARISON:  Comparison made with recent CT from 01/06/2017 as well as brain MRI from 12/27/2016. FINDINGS: Brain: Stable cerebral volume. There has been interval evolution of previously identified infarctions related to meningitis, with progressive hypodensities seen at the level of the left caudate and lentiform nuclei, as well as the cortical gray matter and underlying subcortical/deep white matter of the bilateral frontal lobes near the vertex (series 3, image 24). These areas were seen to be involved on previous brain MRI. No associated mass effect or hemorrhagic transformation. There is a probable new insult with 1 cm hypodensity seen at the right globus pallidus (series 3, image 15), not definitely seen on previous. No associated hemorrhage or mass effect.  No other acute large vessel territory infarct. No acute intracranial hemorrhage. No mass lesion or midline shift. No mass effect. No hydrocephalus. No extra-axial fluid collection. Vascular: No hyperdense vessel. Skull: Scalp soft tissues and calvarium within normal limits. Sinuses/Orbits: Globes and orbital soft tissues within normal limits. Mild scattered mucoperiosteal thickening within the maxillary sinuses and right sphenoid sinus. Visualized paranasal sinuses are otherwise clear. Mastoids and middle ear cavities are clear. IMPRESSION: 1. 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 New 1 cm hypodensity at the right globus pallidus suspicious for new/interval acute/subacute infarction. 2. No evidence for intracranial hemorrhage or other complication. Electronically Signed   By: Jeannine Boga M.D.   On: 02/15/2017 17:31   Dg Fluoro Guide Lumbar Puncture  Result Date: 02/18/2017 CLINICAL DATA:  Cryptococcal meningoencephalitis EXAM: DIAGNOSTIC LUMBAR PUNCTURE UNDER FLUOROSCOPIC GUIDANCE FLUOROSCOPY TIME:  Fluoroscopy Time:  12 seconds Radiation Exposure Index (  if provided by the fluoroscopic device): 0.7 mGy Number of Acquired Spot Images: 1 screen capture PROCEDURE: Informed consent was obtained from the patient's mother prior to the procedure, including potential complications of headache, allergy, and pain. With the patient prone, the lower back was prepped with Betadine. 1% Lidocaine was used for local anesthesia. Lumbar puncture was performed at the L4-5 level using a 20 gauge needle with return of clear CSF with an opening pressure of 19 cm water. 5.5 ml of CSF were obtained for laboratory studies. The patient tolerated the procedure well and there were no apparent complications. IMPRESSION: Successful lumbar puncture, as described above. Opening pressure 19 cm water,  within the upper limits of normal range. Electronically Signed   By: Julian Hy M.D.   On: 02/18/2017 14:45   Dg Fluoro Guide Lumbar Puncture  Result Date: 02/16/2017 CLINICAL DATA:  HNP, cryptococcal meningitis.  Altered mental status EXAM: DIAGNOSTIC LUMBAR PUNCTURE UNDER FLUOROSCOPIC GUIDANCE FLUOROSCOPY TIME:  Fluoroscopy Time:  0 minutes 12 second Radiation Exposure Index (if provided by the fluoroscopic device): Number of Acquired Spot Images: 0 PROCEDURE: Informed consent obtained from the patient's mother who signed the form. Informed consent was obtained prior to the procedure, including potential complications of headache, allergy, and pain. With the patient prone, the lower back was prepped with Betadine. 1% Lidocaine was used for local anesthesia. Lumbar puncture was performed at the L3-4 level using a 20 gauge needle with return of clear CSF with an opening pressure of 44 cm water. Twenty-five ml of CSF were obtained for laboratory studies. Closing pressure 13 cm H2O. The patient tolerated the procedure well and there were no apparent complications. IMPRESSION: Successful lumbar puncture with elevated opening pressure 44 cm water Electronically Signed   By: Franchot Gallo M.D.   On: 02/16/2017 14:10   Dg Fluoro Guide Lumbar Puncture  Result Date: 01/30/2017 CLINICAL DATA:  HIV. EXAM: DIAGNOSTIC LUMBAR PUNCTURE UNDER FLUOROSCOPIC GUIDANCE FLUOROSCOPY TIME:  Fluoroscopy Time:  1 minutes and 12 seconds Radiation Exposure Index (if provided by the fluoroscopic device): Not applicable. Number of Acquired Spot Images: 1 PROCEDURE: Informed consent was obtained from the patient's mother prior to the procedure, including potential complications of headache, allergy, and pain. With the patient prone, the lower back was prepped with Betadine. 1% Lidocaine was used for local anesthesia. Lumbar puncture was performed at the L2-3 level using a 20 gauge needle with return of clear CSF with an opening  pressure of 9 cm water. 3.5 ml of CSF were obtained for laboratory studies. The patient tolerated the procedure well and there were no apparent complications. Further fluid could not be obtained secondary to patient's clinical status and low pressure. Closing pressure was not performed secondary to low pressure throughout. IMPRESSION: Non complicated lumbar puncture as detailed above. Electronically Signed   By: Abigail Miyamoto M.D.   On: 01/30/2017 16:26   Dg Fluoro Guide Lumbar Puncture  Result Date: 01/25/2017 Etheleen Mayhew, MD     01/25/2017  1:46 PM Lumbar puncture performed at L2-L3.  Opening pressure 22 cm H20.  22 mL clear CSF obtained and sent to lab for testing.  Please see full dictation in PACS for additional details.    Lab Data:  CBC:  Recent Labs Lab 02/15/17 1232  02/18/17 0611 02/18/17 1944 02/19/17 0335 02/20/17 0359 02/21/17 0312  WBC 5.4  < > 3.7* 3.4* 3.4* 3.1* 2.3*  NEUTROABS 4.3  --   --  2.7  --  1.9  1.2*  HGB 7.6*  < > 7.0* 8.1*  8.1* 9.7* 10.3* 10.5*  HCT 22.5*  < > 22.0* 25.6*  25.9* 29.9* 31.5* 32.6*  MCV 84.6  < > 87.0 87.4 86.4 85.4 84.9  PLT 316  < > 121* 115* 108* 99* 115*  < > = values in this interval not displayed. Basic Metabolic Panel:  Recent Labs Lab 02/15/17 1413 02/16/17 0518 02/17/17 0314 02/18/17 0611 02/19/17 0335  NA 134* 138 141 136 137  K 4.2 4.3 3.8 3.6 3.6  CL 109 115* 118* 115* 111  CO2 19* 14* 15* 17* 20*  GLUCOSE 122* 87 90 96 86  BUN 50* 46* 35* 23* 15  CREATININE 2.09* 1.54* 0.92 0.54* 0.53*  CALCIUM 8.5* 8.4* 8.7* 8.1* 8.4*   GFR: Estimated Creatinine Clearance: 108.1 mL/min (A) (by C-G formula based on SCr of 0.53 mg/dL (L)). Liver Function Tests:  Recent Labs Lab 02/16/17 0518  AST 35  ALT 31  ALKPHOS 94  BILITOT 0.6  PROT 7.6  ALBUMIN 2.0*   No results for input(s): LIPASE, AMYLASE in the last 168 hours. No results for input(s): AMMONIA in the last 168 hours. Coagulation Profile:  Recent  Labs Lab 02/17/17 2122  INR 1.20   Cardiac Enzymes: No results for input(s): CKTOTAL, CKMB, CKMBINDEX, TROPONINI in the last 168 hours. BNP (last 3 results) No results for input(s): PROBNP in the last 8760 hours. HbA1C: No results for input(s): HGBA1C in the last 72 hours. CBG:  Recent Labs Lab 02/16/17 1605  GLUCAP 84   Lipid Profile: No results for input(s): CHOL, HDL, LDLCALC, TRIG, CHOLHDL, LDLDIRECT in the last 72 hours. Thyroid Function Tests: No results for input(s): TSH, T4TOTAL, FREET4, T3FREE, THYROIDAB in the last 72 hours. Anemia Panel:  Recent Labs  02/20/17 0359 02/21/17 0312  RETICCTPCT <0.4* <0.4*   Urine analysis:    Component Value Date/Time   COLORURINE YELLOW 02/15/2017 1342   APPEARANCEUR HAZY (A) 02/15/2017 1342   LABSPEC 1.021 02/15/2017 1342   PHURINE 6.0 02/15/2017 1342   GLUCOSEU NEGATIVE 02/15/2017 1342   HGBUR SMALL (A) 02/15/2017 1342   BILIRUBINUR NEGATIVE 02/15/2017 1342   KETONESUR NEGATIVE 02/15/2017 1342   PROTEINUR 30 (A) 02/15/2017 1342   NITRITE POSITIVE (A) 02/15/2017 1342   LEUKOCYTESUR TRACE (A) 02/15/2017 1342     Harace Mccluney M.D. [facility] 02/21/2017, 12:04 PM  Pager: [phone] Between 7am to 7pm - call Pager - [phone]  After 7pm go to [email or web address] - password [email or web address]  Call night coverage person covering after 7pm

## 2017-02-21 NOTE — Progress Notes (Signed)
[facility] for Infectious Disease   Reason for visit: Follow up on Cryptococcal meningitis  Interval History: no new issues, no fever.  No response from patient.  No acute events; WBC 2.3.    Physical Exam: Constitutional:  Vitals:   02/21/17 0326 02/21/17 0730  BP: 128/87 119/81  Pulse: (!) 46 72  Resp: 15 17  Temp: 97.6 F (36.4 C) 98.1 F (36.7 C)   patient alert but no response Eyes: anicteric Respiratory: Normal respiratory effort; CTA B Cardiovascular: RRR GI: soft, nt, nd  Review of Systems: Unable to be assessed due to mental status  Lab Results  Component Value Date   WBC 2.3 (L) 02/21/2017   HGB 10.5 (L) 02/21/2017   HCT 32.6 (L) 02/21/2017   MCV 84.9 02/21/2017   PLT 115 (L) 02/21/2017    Lab Results  Component Value Date   CREATININE 0.53 (L) 02/19/2017   BUN 15 02/19/2017   NA 137 02/19/2017   K 3.6 02/19/2017   CL 111 02/19/2017   CO2 20 (L) 02/19/2017    Lab Results  Component Value Date   ALT 31 02/16/2017   AST 35 02/16/2017   ALKPHOS 94 02/16/2017     Microbiology: Recent Results (from the past 240 hour(s))  Culture, blood (Routine x 2)     Status: None   Collection Time: 02/15/17 12:45 PM  Result Value Ref Range Status   Specimen Description BLOOD LEFT ANTECUBITAL  Final   Special Requests   Final    BOTTLES DRAWN AEROBIC ONLY Blood Culture adequate volume   Culture NO GROWTH 5 DAYS  Final   Report Status 02/20/2017 FINAL  Final  Culture, blood (Routine x 2)     Status: None   Collection Time: 02/15/17 12:51 PM  Result Value Ref Range Status   Specimen Description BLOOD RIGHT ANTECUBITAL  Final   Special Requests IN PEDIATRIC BOTTLE Blood Culture adequate volume  Final   Culture NO GROWTH 5 DAYS  Final   Report Status 02/20/2017 FINAL  Final  CSF culture     Status: None   Collection Time: 02/16/17  1:29 PM  Result Value Ref Range Status   Specimen Description CSF  Final   Special Requests NONE  Final   Gram Stain    Final    WBC PRESENT, PREDOMINANTLY PMN YEAST CYTOSPIN SMEAR CRITICAL RESULT CALLED TO, READ BACK BY AND VERIFIED WITH: Candie Chroman RN AT [id number] ON [id number] BY SJW    Culture NO GROWTH 3 DAYS  Final   Report Status 02/19/2017 FINAL  Final  MRSA PCR Screening     Status: None   Collection Time: 02/16/17  2:44 PM  Result Value Ref Range Status   MRSA by PCR NEGATIVE NEGATIVE Final    Comment:        The GeneXpert MRSA Assay (FDA approved for NASAL specimens only), is one component of a comprehensive MRSA colonization surveillance program. It is not intended to diagnose MRSA infection nor to guide or monitor treatment for MRSA infections.   CSF culture     Status: None (Preliminary result)   Collection Time: 02/18/17  2:00 PM  Result Value Ref Range Status   Specimen Description CSF  Final   Special Requests TUBE 2  Final   Gram Stain   Final    WBC PRESENT,BOTH PMN AND MONONUCLEAR YEAST CYTOSPIN SMEAR CRITICAL RESULT CALLED TO, READ BACK BY AND VERIFIED WITH: A. RODRIGUEZ RN, AT [phone] 02/18/17 BY  D. VANHOOK    Culture NO GROWTH 3 DAYS  Final   Report Status PENDING  Incomplete  Culture, fungus without smear     Status: None (Preliminary result)   Collection Time: 02/18/17  2:00 PM  Result Value Ref Range Status   Specimen Description CSF  Final   Special Requests TUBE 2  Final   Culture NO FUNGUS ISOLATED AFTER 2 DAYS  Final   Report Status PENDING  Incomplete    Impression/Plan:  1. 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  He will need prolonged fluconazole the next 6-12 months.    2. Increased ICP - normalized after one tap.  I discussed with Dr. Leonel Ramsay Sunday and agree he should get a new LP tomorrow and if ICP remains normal to repeat the LP weekly for the next 2 weeks.  If the ICP increases significantly though, will need to discuss the possibility/threshold for VPS with Dr.  Kathyrn Sheriff who saw the patient previously.   I will order LP by fluoro for tomorrow  3.  Pancytopenia - likely from poorly controlled HIV.   4.  HIV/AIDS - unable at this time to start ARVs, though obviously will be critical to his survival.  The concern is #2 which can worsen due to IRIS with starting ARVs but will conside if ICPs remain wnl.

## 2017-02-21 NOTE — Progress Notes (Signed)
Palliative Care  William Pena is unable to participate in a goals of care conversation. I spoke with his mother over the phone. She agreed to meet with me tomorrow morning (around 9-10am).   Charlynn Court AGNP-C Palliative Care [phone]  No charge note.

## 2017-02-21 NOTE — Progress Notes (Signed)
Nutrition Follow Up  DOCUMENTATION CODES:   Not applicable  INTERVENTION:    Continue Enlive po BID, each supplement provides 350 kcal and 20 grams of protein   Continue Prostat liquid protein po 30 ml BID with meals, each supplement provides 100 kcal, 15 grams protein  NEW NUTRITION DIAGNOSIS:   Malnutrition (severe) related to chronic illness (AIDS) as evidenced by severe depletion of body fat, severe depletion of muscle mass, percent weight loss, ongoing  GOAL:   Patient will meet greater than or equal to 90% of their needs, progressing   MONITOR:   PO intake, Supplement acceptance, Labs, Weight trends, Skin, I & O's  ASSESSMENT:   31 yo Male with PMH of AIDS, recent admission for perirectal abscess, development of cryptococcal meningoencephalitis, recent CVA,  ARF, severe protein calorie malnutrition who presents to the ED from facility with increased mental status change. Patient was in usual state of health. On recent discharge (dated 7/9), patient was recommended by ID to continue fluconazole x 4 more weeks before resuming antiviral meds for HIV. Patient is non-verbal at baseline. Patient's mother reported patient's poor PO intake recently.  Pt s/p lumbar puncture 7/20 per Neurology. PO intake variable at 25-50% per flowsheet records. Per RN, when pt's Mom is here, she feeds him and he eats well.  Drinking some of his Ensure Enlive and taking his Prostat liquid protein. Medications reviewed and include MVI. Labs reviewed. Palliative Care Team consulted for goals of care.  Nutrition-Focused physical exam completed.  Findings are severe fat depletion, severe muscle depletion, and no edema.  Pt has had a 14% weight loss since May 2018. Severe for time frame.  Diet Order:  Diet regular Room service appropriate? Yes; Fluid consistency: Thin  Skin:    3 full thickness wounds to bilat buttocks DTI Left heel DTI Right heel   Last BM:  7/23  Height:   Ht Readings  from Last 1 Encounters:  02/17/17 5\' 6"  (1.676 m)   Weight:   Wt Readings from Last 1 Encounters:  02/16/17 125 lb 12.8 oz (57.1 kg)   Wt Readings from Last 10 Encounters:  02/16/17 125 lb 12.8 oz (57.1 kg)  02/07/17 136 lb (61.7 kg)  01/11/17 136 lb 11 oz (62 kg)  12/20/16 145 lb (65.8 kg)  12/17/16 145 lb (65.8 kg)   Ideal Body Weight:  75.4 kg  BMI:  Body mass index is 20.3 kg/m.  Estimated Nutritional Needs:   Kcal:  1800-2000  Protein:  90-110 gm  Fluid:  1.8-2.0 L  EDUCATION NEEDS:   No education needs identified at this time  Arthur Holms, RD, LDN Pager #: [phone] After-Hours Pager #: [phone]

## 2017-02-21 NOTE — Consult Note (Signed)
Consultation Note Date: 02/21/2017   Patient Name: William Pena  DOB: [birth year]  MRN: [id number]  Age / Sex: 31 y.o., male  PCP: [name], [name], MD Referring Physician: [name], MD  Reason for Consultation: Establishing goals of care and Psychosocial/spiritual support  HPI/Patient Profile: 31 y.o. 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 He was recently here 4/78-2/9 with a complicated course due to disseminated cryptococcal infection and cryptococcal meningoencephalitis. He now presents from SNF with worsening encephalopathy and decreased oral intake. He was admitted on 02/15/2017. On admission he was was found to be septic in the setting of recurrent cryptococcal meningitis; ID following. He also had increased ICP for which he is having serial LPs to monitor (VP shunt to be considered if ICPs climb again); Neurology consulted and advised. 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 Palliative consulted to assist family in clarifying goals of care given the patient's recurrent health issues and now significant failure to thrive.   Clinical Assessment and Goals of Care: William Pena is unable to participate in a goals of care conversation. [name] awake and intermittently tracking, but otherwise unresponsive/non-interactive. His mother, William Pena, is at his bedside and agreed to meet with me. She has been the only visiting family member. Although William Pena also has a brother locally. His father is out of state.  William Pena and I met outside of the room and had a long conversation about her son's health leading up to this admission. She is very aware of his  many medical issues and his progressive decline over the past few months. She also relates that he has more recently been talking about dying, and in fact told her he was in the process of dying ~4 months ago. Since entering the SNF after the prior discharge, she found him increasingly withdrawn and resigned to his debilitated state. She has also been feeling that he is tired and lost so much of what brought him joy and defined him. She has started preparing herself for his death.  We then talked about different paths forward. One option is to continue ongoing aggressive care. We talked through the procedures and interventions this would include, as well as the realistic short term and long term issues he would have to overcome, and the location of care. I then talked through the option of shifting focus towards a comfort approach, which I explained as a focus on comfort and dignity with the plan to not pursue aggressive interventions but rather keep him comfortable for whatever time he has left, then support him in dying with dignity. Finally, I addressed his code status and we talked through what a code would entail and the likely outcomes of it.   At this point William Pena wanted a chance to think on our discussion and talk with her other family members about it. She asked to meet again tomorrow at 0900 to discuss it again.   ADDENDUM: At the end of our discussion I was walking her back to the patient's room and she mentioned a lawyer involved in his case. I contacted William Pena [phone]). She is his Chief Executive Officer and guardian ad litem. She is in the process of working through guardianship paperwork filed by William Pena. We discussed his case and I expressed my belief that he  did not have capacity to make decisions. She will come to the hospital and assess that, as well as move forward with the guardianship paperwork (hearing on July 31st). In the interim, decision making would fall to the statutory  requirement. As he has no HCPOA, no guardian, and no spouse, medical decision making would fall to his parents and adult children. He has three children that are all minors. I've spoken with his mother at length as noted above. I then called his father, William Pena, and gave a medical update and started to explore his perception of his son's goals of care. In stark contrast to William Pena's feelings, he relates that his son "is a Nurse, adult" who would want to live and fight to live no matter how hard the journey. He doesn't believe William Pena would ever accept end of life, and would die "still trying everything to live." For William Pena, he feels strongly his son will turn a corner and start communicating once his grandmother visits this weekend (unclear if she is coming Saturday or Sunday). From our conversation, it is clear that he would not even entertain the option of considering changing code status or comfort measures until at least she has been here and can share her thoughts.   Primary Decision Maker Per [place] statute: Mother and Father (No HCPOA, guardian, or adult children. Guardianship hearing is planned for 7/31, per pt's Guardian ad Litem)   SUMMARY OF RECOMMENDATIONS    Full code, full scope care  Difficult situation as mother and father have very different perception's of their son's wishes. I suspect we will need time to let this play out. Grandmother to visit this weekend, which will help the father understand his state. Guardianship hearing next week.  Code Status/Advance Care Planning:  Full code   Additional Recommendations (Limitations, Scope, Preferences):  Full Scope Treatment  Psycho-social/Spiritual:   Desire for further Chaplaincy support:no  Additional Recommendations: TBD  Prognosis:   Unable to determine  Discharge Planning: To Be Determined      Primary Diagnoses: Present on Admission: . Protein-calorie malnutrition, severe . HIV (human immunodeficiency virus infection)  ([place]) . Encephalopathy acute . Cryptococcal meningoencephalitis ([place]) . AIDS (acquired immune deficiency syndrome) ([place]) . AKI (acute kidney injury) ([place]) . Anemia of chronic disease . Acute encephalopathy . Sepsis ([place])   I have reviewed the medical record, interviewed the patient and family, and examined the patient. The following aspects are pertinent.  Past Medical History:  Diagnosis Date  . Acute respiratory failure ([place]) 12/23/2016  . AIDS (acquired immune deficiency syndrome) ([place]) 07/07/2016  . AKI (acute kidney injury) ([place])   . Cavitary pneumonia   . Cryptococcal meningoencephalitis ([place])   . Diffuse lymphadenopathy   . 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 Drug rash   . Elevated intracranial pressure   . Elevated LFTs 12/21/2016  . 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 Encephalopathy acute 12/23/2016  . HIV (human immunodeficiency virus infection) ([place])   . HIV disease ([place])   . Hypomagnesemia 12/23/2016  . Hyponatremia 12/21/2016  . Lymphadenopathy of head and neck   . Meningitis   . Normocytic anemia 12/21/2016  . Perirectal abscess   . Protein-calorie malnutrition, severe 01/28/2017  . Pulmonary infiltrates   . SIRS (systemic inflammatory response syndrome) ([place]) 12/23/2016   Social History   Social History  . Marital status: Single    Spouse name: N/A  . Number of children: N/A  . Years of education: N/A   Social History Main Topics  .  Smoking status: Former Smoker    Packs/day: 0.33    Types: Cigarettes  . Smokeless tobacco: Never Used  . Alcohol use No  . Drug use: No  . Sexual activity: Not Asked   Other Topics Concern  . None   Social History Narrative   Admitted to [facility]   Single   Former smoker   Alcohol none   Full Code   Family History  Problem Relation Age of Onset  . Diabetes Other    Scheduled Meds: . amantadine  100 mg Oral BID WC  . feeding supplement (ENSURE ENLIVE)  237 mL Oral BID BM  . feeding supplement  (PRO-STAT SUGAR FREE 64)  30 mL Oral BID  . multivitamin with minerals  1 tablet Oral Daily  . nystatin  5 mL Oral QID  . tamsulosin  0.4 mg Oral Daily   Continuous Infusions: . sodium chloride 50 mL/hr at 02/21/17 0511  . fluconazole (DIFLUCAN) IV 400 mg (02/21/17 1152)   PRN Meds:.acetaminophen **OR** acetaminophen, HYDROcodone-acetaminophen, polyethylene glycol, polyvinyl alcohol Allergies  Allergen Reactions  . Carrot Oil Swelling    "throat swell"   . Augmentin [Amoxicillin-Pot Clavulanate] Rash    "break out"   Review of Systems  Unable to perform ROS   Physical Exam  HENT:  Head: Normocephalic and atraumatic.  Nose: Nose normal.  Unable to visualize mouth as pt clamping it shut  Eyes:  Right pupil slightly larger than left. Will track me briefly.   Neck: Normal range of motion. Neck supple.  Cardiovascular: Normal rate and regular rhythm.   Pulmonary/Chest: Effort normal. He has decreased breath sounds in the right lower field and the left lower field. He has rhonchi (scattered).  Abdominal: Soft. Normal appearance and bowel sounds are normal.  Musculoskeletal:  UTA, right fingers with rare spastic/jerks, no movement seen on RLE. LUE with occasional purposeful movement, no movement observed on LLE.   Neurological:  Pt is awake and will track me with his eyes for brief periods. [name] otherwise not interactive or responsive.  Skin: Skin is warm and dry.  Extensive tatoos  Psychiatric:  Calm in bed, otherwise non-interactive   Vital Signs: BP 115/84   Pulse 60   Temp 98 F (36.7 C) (Oral)   Resp 16   Ht '5\' 6"'  (1.676 m)   Wt 57.1 kg (125 lb 12.8 oz)   SpO2 99%   BMI 20.30 kg/m  Pain Assessment: CPOT   Pain Pena: Asleep (pt. resting with eyes closed)   SpO2: SpO2: 99 % O2 Device:SpO2: 99 % O2 Flow Rate: .O2 Flow Rate (L/min): 2 L/min  IO: Intake/output summary:  Intake/Output Summary (Last 24 hours) at 02/21/17 1308 Last data filed at 02/21/17 1000   Gross per 24 hour  Intake             1380 ml  Output              675 ml  Net              705 ml    LBM: Last BM Date: 02/20/17 Baseline Weight: Weight: 57.1 kg (125 lb 12.8 oz) Most recent weight: Weight: 57.1 kg (125 lb 12.8 oz)     Palliative Assessment/Data: PPS 30%   Flowsheet Rows     Most Recent Value  Intake Tab  Referral Department  Hospitalist  Unit at Time of Referral  ICU  Palliative Care Primary Diagnosis  Sepsis/Infectious Disease  Date Notified  02/21/17  Palliative Care Type  New Palliative care  Reason for referral  Clarify Goals of Care  Date of Admission  02/15/17  # of days IP prior to Palliative referral  6  Clinical Assessment  Psychosocial & Spiritual Assessment  Palliative Care Outcomes      Time In: 0930 Time Out: 1120 Time Total: 150 minutes Greater than 50%  of this time was spent counseling and coordinating care related to the above assessment and plan.  Signed by: Charlynn Court, NP Palliative Medicine Team Pager # [phone] (M-F 7a-5p) Team Phone # [phone] (Nights/Weekends)

## 2017-02-22 ENCOUNTER — Inpatient Hospital Stay (HOSPITAL_COMMUNITY): Payer: Medicaid Other

## 2017-02-22 ENCOUNTER — Other Ambulatory Visit: Payer: Self-pay

## 2017-02-22 DIAGNOSIS — T148XXA Other injury of unspecified body region, initial encounter: Secondary | ICD-10-CM | POA: Diagnosis present

## 2017-02-22 DIAGNOSIS — Z7189 Other specified counseling: Secondary | ICD-10-CM

## 2017-02-22 DIAGNOSIS — S31809D Unspecified open wound of unspecified buttock, subsequent encounter: Secondary | ICD-10-CM

## 2017-02-22 DIAGNOSIS — D72819 Decreased white blood cell count, unspecified: Secondary | ICD-10-CM

## 2017-02-22 DIAGNOSIS — S31809A Unspecified open wound of unspecified buttock, initial encounter: Secondary | ICD-10-CM | POA: Diagnosis present

## 2017-02-22 DIAGNOSIS — Z515 Encounter for palliative care: Secondary | ICD-10-CM

## 2017-02-22 LAB — CSF CELL COUNT WITH DIFFERENTIAL
Eosinophils, CSF: 2 % — ABNORMAL HIGH (ref 0–1)
Lymphs, CSF: 96 % — ABNORMAL HIGH (ref 40–80)
Monocyte-Macrophage-Spinal Fluid: 2 % — ABNORMAL LOW (ref 15–45)
RBC COUNT CSF: 9 /mm3 — AB
TUBE #: 3
WBC, CSF: 16 /mm3 (ref 0–5)

## 2017-02-22 LAB — CBC WITH DIFFERENTIAL/PLATELET
BASOS ABS: 0 10*3/uL (ref 0.0–0.1)
BASOS PCT: 0 %
EOS PCT: 3 %
Eosinophils Absolute: 0.1 10*3/uL (ref 0.0–0.7)
HEMATOCRIT: 32.2 % — AB (ref 39.0–52.0)
Hemoglobin: 10.5 g/dL — ABNORMAL LOW (ref 13.0–17.0)
LYMPHS PCT: 37 %
Lymphs Abs: 0.9 10*3/uL (ref 0.7–4.0)
MCH: 28.3 pg (ref 26.0–34.0)
MCHC: 32.6 g/dL (ref 30.0–36.0)
MCV: 86.8 fL (ref 78.0–100.0)
MONO ABS: 0.1 10*3/uL (ref 0.1–1.0)
MONOS PCT: 5 %
NEUTROS ABS: 1.4 10*3/uL — AB (ref 1.7–7.7)
Neutrophils Relative %: 55 %
PLATELETS: 134 10*3/uL — AB (ref 150–400)
RBC: 3.71 MIL/uL — ABNORMAL LOW (ref 4.22–5.81)
RDW: 15.7 % — AB (ref 11.5–15.5)
WBC: 2.4 10*3/uL — ABNORMAL LOW (ref 4.0–10.5)

## 2017-02-22 LAB — RETICULOCYTES
RBC.: 3.71 MIL/uL — ABNORMAL LOW (ref 4.22–5.81)
Retic Count, Absolute: 18.6 10*3/uL — ABNORMAL LOW (ref 19.0–186.0)
Retic Ct Pct: 0.5 % (ref 0.4–3.1)

## 2017-02-22 LAB — HAPTOGLOBIN: HAPTOGLOBIN: 261 mg/dL — AB (ref 34–200)

## 2017-02-22 LAB — BASIC METABOLIC PANEL
ANION GAP: 5 (ref 5–15)
BUN: 13 mg/dL (ref 6–20)
CO2: 21 mmol/L — AB (ref 22–32)
Calcium: 8 mg/dL — ABNORMAL LOW (ref 8.9–10.3)
Chloride: 111 mmol/L (ref 101–111)
Creatinine, Ser: 0.51 mg/dL — ABNORMAL LOW (ref 0.61–1.24)
GFR calc Af Amer: >60 mL/min (ref >60)
GFR calc non Af Amer: >60 mL/min (ref >60)
GLUCOSE: 86 mg/dL (ref 65–99)
POTASSIUM: 3.3 mmol/L — AB (ref 3.5–5.1)
Sodium: 137 mmol/L (ref 135–145)

## 2017-02-22 LAB — PROTEIN, CSF: Total  Protein, CSF: 148 mg/dL — ABNORMAL HIGH (ref 15–45)

## 2017-02-22 LAB — ERYTHROPOIETIN: Erythropoietin: 43.1 m[IU]/mL — ABNORMAL HIGH (ref 2.6–18.5)

## 2017-02-22 LAB — GLUCOSE, CSF: Glucose, CSF: 46 mg/dL (ref 40–70)

## 2017-02-22 MED ORDER — LIDOCAINE HCL (PF) 1 % IJ SOLN
5.0000 mL | Freq: Once | INTRAMUSCULAR | Status: AC
  Administered 2017-02-22: 5 mL via INTRADERMAL

## 2017-02-22 MED ORDER — AZITHROMYCIN 600 MG PO TABS
1200.0000 mg | ORAL_TABLET | ORAL | Status: DC
  Administered 2017-02-22 – 2017-03-15 (×4): 1200 mg via ORAL
  Filled 2017-02-22 (×5): qty 2

## 2017-02-22 MED ORDER — SULFAMETHOXAZOLE-TRIMETHOPRIM 800-160 MG PO TABS
1.0000 | ORAL_TABLET | Freq: Every day | ORAL | Status: DC
  Administered 2017-02-22 – 2017-03-08 (×15): 1 via ORAL
  Filled 2017-02-22 (×15): qty 1

## 2017-02-22 NOTE — Progress Notes (Signed)
[facility] for Infectious Disease   Reason for visit: Follow up on Cryptococcal meningitis  Interval History: no new issues, no fever.  No response.  Lumbar puncture with again a normal pressure.   Physical Exam: Constitutional:  Vitals:   02/22/17 0000 02/22/17 0321  BP: (!) 141/91 123/81  Pulse: (!) 55 (!) 48  Resp: 15 16  Temp: 98.4 F (36.9 C) 98.4 F (36.9 C)   patient alert but no response Eyes: anicteric, gazing upward Respiratory: Normal respiratory effort; CTA B Cardiovascular: RRR GI: soft, nt, nd  Review of Systems: Unable to be assessed due to mental status  Lab Results  Component Value Date   WBC 2.4 (L) 02/22/2017   HGB 10.5 (L) 02/22/2017   HCT 32.2 (L) 02/22/2017   MCV 86.8 02/22/2017   PLT 134 (L) 02/22/2017    Lab Results  Component Value Date   CREATININE 0.51 (L) 02/22/2017   BUN 13 02/22/2017   NA 137 02/22/2017   K 3.3 (L) 02/22/2017   CL 111 02/22/2017   CO2 21 (L) 02/22/2017    Lab Results  Component Value Date   ALT 31 02/16/2017   AST 35 02/16/2017   ALKPHOS 94 02/16/2017     Microbiology: Recent Results (from the past 240 hour(s))  Culture, blood (Routine x 2)     Status: None   Collection Time: 02/15/17 12:45 PM  Result Value Ref Range Status   Specimen Description BLOOD LEFT ANTECUBITAL  Final   Special Requests   Final    BOTTLES DRAWN AEROBIC ONLY Blood Culture adequate volume   Culture NO GROWTH 5 DAYS  Final   Report Status 02/20/2017 FINAL  Final  Culture, blood (Routine x 2)     Status: None   Collection Time: 02/15/17 12:51 PM  Result Value Ref Range Status   Specimen Description BLOOD RIGHT ANTECUBITAL  Final   Special Requests IN PEDIATRIC BOTTLE Blood Culture adequate volume  Final   Culture NO GROWTH 5 DAYS  Final   Report Status 02/20/2017 FINAL  Final  CSF culture     Status: None   Collection Time: 02/16/17  1:29 PM  Result Value Ref Range Status   Specimen Description CSF  Final   Special  Requests NONE  Final   Gram Stain   Final    WBC PRESENT, PREDOMINANTLY PMN YEAST CYTOSPIN SMEAR CRITICAL RESULT CALLED TO, READ BACK BY AND VERIFIED WITH: Candie Chroman RN AT [id number] ON [id number] BY SJW    Culture NO GROWTH 3 DAYS  Final   Report Status 02/19/2017 FINAL  Final  MRSA PCR Screening     Status: None   Collection Time: 02/16/17  2:44 PM  Result Value Ref Range Status   MRSA by PCR NEGATIVE NEGATIVE Final    Comment:        The GeneXpert MRSA Assay (FDA approved for NASAL specimens only), is one component of a comprehensive MRSA colonization surveillance program. It is not intended to diagnose MRSA infection nor to guide or monitor treatment for MRSA infections.   CSF culture     Status: None   Collection Time: 02/18/17  2:00 PM  Result Value Ref Range Status   Specimen Description CSF  Final   Special Requests TUBE 2  Final   Gram Stain   Final    WBC PRESENT,BOTH PMN AND MONONUCLEAR YEAST CYTOSPIN SMEAR CRITICAL RESULT CALLED TO, READ BACK BY AND VERIFIED WITH: A. RODRIGUEZ RN, AT [phone]  02/18/17 BY D. VANHOOK    Culture NO GROWTH 3 DAYS  Final   Report Status 02/21/2017 FINAL  Final  Culture, fungus without smear     Status: None (Preliminary result)   Collection Time: 02/18/17  2:00 PM  Result Value Ref Range Status   Specimen Description CSF  Final   Special Requests TUBE 2  Final   Culture NO FUNGUS ISOLATED AFTER 3 DAYS  Final   Report Status PENDING  Incomplete    Impression/Plan:  1. 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  He will need prolonged fluconazole the next 6-12 months.    2. Increased ICP - normalized after one tap.  Remains normal again today.  At this point, it will be optimal to repeat the LP in about 7 days and again in 14 days from now and if it remains normal, we can consider restarting his ARVs.   This can be done as an oupatient  3.  Pancytopenia -  likely from poorly controlled HIV.   4.  HIV/AIDS - can continue with Bactrim DS once daily and weekly 1200 mg azithromycin at discharge.    5. GOC - I agree that this is a very hard road ahead that will be unlikely to have a positive outcome and continued efforts at palliative care is optimal.

## 2017-02-22 NOTE — Progress Notes (Signed)
Progress Note    William Pena  WSF:[id number] DOB: May 05, 1986  DOA: 02/15/2017 PCP: [name], MD    Brief Narrative:   Chief complaint: Follow-up encephalopathy  Medical records reviewed and are as summarized below:  William Pena is an 31 y.o. 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 Initial impression from ID was that he had relapsed cryptococcal meningitis.  Assessment/Plan:   Principal Problem:   Sepsis ([name] to cryptococcal meningeal encephalitis Being treated for relapsed cryptococcal meningitis with fluconazole. Will need 8 more weeks of therapy per ID recommendations. Vital signs currently stable with resolution of sepsis.  Active Problems:   AIDS (acquired immune deficiency syndrome) (HCC)/HIV infection Unable to start antiretroviral therapy at this time due to the risk of immune reconstitution inflammatory syndrome per ID. Palliative care consulted with goals of care meeting scheduled for today.Continue Bactrim daily and azithromycin weekly to prevent opportunistic infections.    Encephalopathy acute Appears to be related to cryptococcal meningeal encephalitis.    AKI (acute kidney injury) ([place]) Resolved.    Protein-calorie malnutrition, severe Continue supplements. Body mass index is 20.3 kg/m.    Anemia of chronic disease/thrombocytopenia/Leukopenia Hematology following. 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 Transfuse when necessary per recommendations.    3 full thickness wounds to bilat buttocks; 2X.5X.5cm, 1X1X1cm, 5.5X3X.2cm Wound care per WOC. Per her notes: "Pt had I&D of buttock abscesses in May with the surgical team, according to the EMR.  These wounds were present on admission but are NOT pressure  injuries."    Deep tissue injuries bilateral heels Wound care per wound care team. Floating heels.   Family Communication/Anticipated D/C date and plan/Code Status   DVT prophylaxis: SCDs ordered. Code Status: Full Code.  Family Communication: No family presently at the bedside. Disposition Plan: Unclear, suspect will need a skilled level of care.   Medical Consultants:    Infectious Disease  Palliative Care  Neurology  Oncology   Anti-Infectives:    Fluconazole 02/16/17--->  Septra resumed 02/22/17  Azithromycin resumed 02/22/17  Subjective:   Patient is nonverbal.  Objective:    Vitals:   02/21/17 1640 02/21/17 1928 02/22/17 0000 02/22/17 0321  BP: 127/82 134/90 (!) 141/91 123/81  Pulse: (!) 50 (!) 53 (!) 55 (!) 48  Resp: 17 17 15 16   Temp: 98.6 F (37 C) 97.9 F (36.6 C) 98.4 F (36.9 C) 98.4 F (36.9 C)  TempSrc: Axillary Axillary Axillary Axillary  SpO2: 99% 100% 99% 99%  Weight:      Height:        Intake/Output Summary (Last 24 hours) at 02/22/17 0726 Last data filed at 02/22/17 0174  Gross per 24 hour  Intake             1720 ml  Output             2100 ml  Net             -380 ml   Filed Weights   02/16/17 1407  Weight: 57.1 kg (125 lb 12.8 oz)    Exam: General exam: Emaciated appearing, chronically ill male who is awake and alert but non-verbal. Respiratory system: Lungs are diminished in the bases but otherwise clear. Cardiovascular system: Heart sounds are regular with an S1, S2. No murmurs, rubs, or gallops. Gastrointestinal system:  Abdomen is soft, nontender, nondistended with normal active bowel sounds. Central nervous system: Alert but nonresponsive verbally. Tracks. Right-sided hemiparesis. Extremities: No clubbing, edema, or cyanosis. Heel protectors in place. Skin: No rashes. Multiple tattoos. Psychiatry: Mood and affect flat. Unable to assess insight or judgment.  Data Reviewed:   I have personally reviewed following  labs and imaging studies:  Labs: Labs show the following:   Basic Metabolic Panel:  Recent Labs Lab 02/15/17 1413 02/16/17 0518 02/17/17 0314 02/18/17 0611 02/19/17 0335  NA 134* 138 141 136 137  K 4.2 4.3 3.8 3.6 3.6  CL 109 115* 118* 115* 111  CO2 19* 14* 15* 17* 20*  GLUCOSE 122* 87 90 96 86  BUN 50* 46* 35* 23* 15  CREATININE 2.09* 1.54* 0.92 0.54* 0.53*  CALCIUM 8.5* 8.4* 8.7* 8.1* 8.4*   GFR Estimated Creatinine Clearance: 108.1 mL/min (A) (by C-G formula based on SCr of 0.53 mg/dL (L)). Liver Function Tests:  Recent Labs Lab 02/16/17 0518  AST 35  ALT 31  ALKPHOS 94  BILITOT 0.6  PROT 7.6  ALBUMIN 2.0*   No results for input(s): LIPASE, AMYLASE in the last 168 hours. No results for input(s): AMMONIA in the last 168 hours. Coagulation profile  Recent Labs Lab 02/17/17 2122  INR 1.20    CBC:  Recent Labs Lab 02/15/17 1232  02/18/17 0611 02/18/17 1944 02/19/17 0335 02/20/17 0359 02/21/17 0312  WBC 5.4  < > 3.7* 3.4* 3.4* 3.1* 2.3*  NEUTROABS 4.3  --   --  2.7  --  1.9 1.2*  HGB 7.6*  < > 7.0* 8.1*  8.1* 9.7* 10.3* 10.5*  HCT 22.5*  < > 22.0* 25.6*  25.9* 29.9* 31.5* 32.6*  MCV 84.6  < > 87.0 87.4 86.4 85.4 84.9  PLT 316  < > 121* 115* 108* 99* 115*  < > = values in this interval not displayed. Cardiac Enzymes: No results for input(s): CKTOTAL, CKMB, CKMBINDEX, TROPONINI in the last 168 hours. BNP (last 3 results) No results for input(s): PROBNP in the last 8760 hours. CBG:  Recent Labs Lab 02/16/17 1605  GLUCAP 84   D-Dimer: No results for input(s): DDIMER in the last 72 hours. Hgb A1c: No results for input(s): HGBA1C in the last 72 hours. Lipid Profile: No results for input(s): CHOL, HDL, LDLCALC, TRIG, CHOLHDL, LDLDIRECT in the last 72 hours. Thyroid function studies: No results for input(s): TSH, T4TOTAL, T3FREE, THYROIDAB in the last 72 hours.  Invalid input(s): FREET3 Anemia work up:  Recent Labs  02/20/17 0359  02/21/17 0312  RETICCTPCT <0.4* <0.4*   Sepsis Labs:  Recent Labs Lab 02/15/17 1259  02/16/17 1112  02/18/17 0611 02/18/17 1944 02/19/17 0335 02/20/17 0359 02/21/17 0312  PROCALCITON  --   --  1.01  --  0.22  --   --  <0.10  --   WBC  --   < >  --   < > 3.7* 3.4* 3.4* 3.1* 2.3*  LATICACIDVEN 1.13  --   --   --   --   --   --   --   --   < > = values in this interval not displayed.  Microbiology Recent Results (from the past 240 hour(s))  Culture, blood (Routine x 2)     Status: None   Collection Time: 02/15/17 12:45 PM  Result Value Ref Range Status   Specimen Description BLOOD LEFT ANTECUBITAL  Final   Special Requests   Final  BOTTLES DRAWN AEROBIC ONLY Blood Culture adequate volume   Culture NO GROWTH 5 DAYS  Final   Report Status 02/20/2017 FINAL  Final  Culture, blood (Routine x 2)     Status: None   Collection Time: 02/15/17 12:51 PM  Result Value Ref Range Status   Specimen Description BLOOD RIGHT ANTECUBITAL  Final   Special Requests IN PEDIATRIC BOTTLE Blood Culture adequate volume  Final   Culture NO GROWTH 5 DAYS  Final   Report Status 02/20/2017 FINAL  Final  CSF culture     Status: None   Collection Time: 02/16/17  1:29 PM  Result Value Ref Range Status   Specimen Description CSF  Final   Special Requests NONE  Final   Gram Stain   Final    WBC PRESENT, PREDOMINANTLY PMN YEAST CYTOSPIN SMEAR CRITICAL RESULT CALLED TO, READ BACK BY AND VERIFIED WITH: Candie Chroman RN AT [id number] ON [id number] BY SJW    Culture NO GROWTH 3 DAYS  Final   Report Status 02/19/2017 FINAL  Final  MRSA PCR Screening     Status: None   Collection Time: 02/16/17  2:44 PM  Result Value Ref Range Status   MRSA by PCR NEGATIVE NEGATIVE Final    Comment:        The GeneXpert MRSA Assay (FDA approved for NASAL specimens only), is one component of a comprehensive MRSA colonization surveillance program. It is not intended to diagnose MRSA infection nor to guide or monitor treatment  for MRSA infections.   CSF culture     Status: None   Collection Time: 02/18/17  2:00 PM  Result Value Ref Range Status   Specimen Description CSF  Final   Special Requests TUBE 2  Final   Gram Stain   Final    WBC PRESENT,BOTH PMN AND MONONUCLEAR YEAST CYTOSPIN SMEAR CRITICAL RESULT CALLED TO, READ BACK BY AND VERIFIED WITH: A. RODRIGUEZ RN, AT 2563 02/18/17 BY D. VANHOOK    Culture NO GROWTH 3 DAYS  Final   Report Status 02/21/2017 FINAL  Final  Culture, fungus without smear     Status: None (Preliminary result)   Collection Time: 02/18/17  2:00 PM  Result Value Ref Range Status   Specimen Description CSF  Final   Special Requests TUBE 2  Final   Culture NO FUNGUS ISOLATED AFTER 2 DAYS  Final   Report Status PENDING  Incomplete    Procedures and diagnostic studies:  No results found.  Medications:   . amantadine  100 mg Oral BID WC  . feeding supplement (ENSURE ENLIVE)  237 mL Oral BID BM  . feeding supplement (PRO-STAT SUGAR FREE 64)  30 mL Oral BID  . multivitamin with minerals  1 tablet Oral Daily  . nystatin  5 mL Oral QID  . tamsulosin  0.4 mg Oral Daily   Continuous Infusions: . sodium chloride 50 mL/hr at 02/22/17 0644  . fluconazole (DIFLUCAN) IV Stopped (02/21/17 1352)    Medical decision making is of high complexity and this patient is at high risk of deterioration, therefore this is a level 3 visit.  (> 4 problem points, 1 data point, high risk: Need 2 out of 3)   Problems/DDx Points   Self limited or minor (max 2)       1   Established problem, stable       1   Established problem, worsening       2   New problem, no additional W/U  planned (max 1)       3   New problem, additional W/U planned        4    Data Reviewed Points   Review/order clinical lab tests       1   Review/order x-rays       1   Review/order tests (Echo, EKG, PFTs, etc)       1   Discussion of test results w/ performing MD       1   Independent review of image, tracing or  specimen       2   Decision to obtain old records       1   Review and summation of old records       2    Level of risk Presenting prob Diagnostics Management   Minimal 1 self limited/minor Labs CXR EKG/EEG U/A U/S Rest Gargles Bandages Dressings   Low 2 or more self limited/minor 1 stable chronic Acute uncomplicated illness Tests (PFTS) Non-CV imaging Arterial labs Biopsies of skin OTC drugs Minor surgery-no risk PT OT IVF without additives    Moderate 1 or more chronic illnesses w/ mild exac, progression or S/E from tx 2 or more stable chronic illnesses Undiagnosed new problem w/ uncertain prognosis Acute complicated injury  Stress tests Endoscopies with no risk factors Deep needle or incisional bx CV imaging without risk LP Thoracentesis Paracentesis Minor surgery w/ risks Elective major surgery w/ no risk (open, percutaneous or endoscopic) Prescription drugs Therapeutic nucl med IVF with additives Closed tx of fracture/dislocation    High Severe exac of chronic illness Acute or chronic illness/injury may pose a threat to life or bodily function (ARF) Change in neuro status    CV imaging w/ contrast and risk Cardio electophysiologic tests Endoscopies w/ risk Discography Elective major surgery Emergency major surgery Parenteral controlled substances Drug therapy req monitoring for toxicity DNR/de-escalation of care    MDM Prob points Data points Risk   Straightforward    <1    <1    Min   Low complexity    2    2    Low   Moderate    3    3    Mod   High Complexity    4 or more    4 or more    High      LOS: 7 days   RAMA,CHRISTINA  [facility] Pager [phone]. If unable to reach me by pager, please call my cell phone at [phone].  *Please refer to [email or web address], password [email or web address] to get updated schedule on who will round on this patient, as hospitalists switch teams weekly. If 7PM-7AM, please contact night-coverage at [email or web address], password [email or web address]  for any overnight needs.  02/22/2017, 7:26 AM

## 2017-02-22 NOTE — Consult Note (Addendum)
WOC consult requested; this was already performed on 7/19.  Refer to previous consult notes for wound assessments; topical treatment orders have already been provided for staff nurses to perform. Please re-consult if further assistance is needed.  Thank-you,  Julien Girt MSN, [place], [place], [place], [place]

## 2017-02-22 NOTE — Progress Notes (Signed)
CRITICAL VALUE ALERT  Critical Value:  WBC,CSF (16)  Date & Time Notied:  02/22/2017 1304  Provider Notified: Dr Rockne Menghini  (trial Hospitalist)  And Dr [name] (infectious Disease) at 77  Orders Received/Actions taken: No orders given

## 2017-02-23 DIAGNOSIS — G0490 Encephalitis and encephalomyelitis, unspecified: Secondary | ICD-10-CM

## 2017-02-23 DIAGNOSIS — E876 Hypokalemia: Secondary | ICD-10-CM

## 2017-02-23 LAB — CBC WITH DIFFERENTIAL/PLATELET
BASOS ABS: 0 10*3/uL (ref 0.0–0.1)
Basophils Relative: 0 %
Eosinophils Absolute: 0 10*3/uL (ref 0.0–0.7)
Eosinophils Relative: 1 %
HEMATOCRIT: 33.1 % — AB (ref 39.0–52.0)
Hemoglobin: 10.8 g/dL — ABNORMAL LOW (ref 13.0–17.0)
LYMPHS ABS: 0.3 10*3/uL — AB (ref 0.7–4.0)
LYMPHS PCT: 9 %
MCH: 28 pg (ref 26.0–34.0)
MCHC: 32.6 g/dL (ref 30.0–36.0)
MCV: 85.8 fL (ref 78.0–100.0)
MONO ABS: 0.1 10*3/uL (ref 0.1–1.0)
Monocytes Relative: 3 %
NEUTROS ABS: 2.7 10*3/uL (ref 1.7–7.7)
Neutrophils Relative %: 87 %
Platelets: 136 10*3/uL — ABNORMAL LOW (ref 150–400)
RBC: 3.86 MIL/uL — AB (ref 4.22–5.81)
RDW: 15.4 % (ref 11.5–15.5)
WBC: 3.2 10*3/uL — ABNORMAL LOW (ref 4.0–10.5)

## 2017-02-23 LAB — PATHOLOGIST SMEAR REVIEW

## 2017-02-23 LAB — FUNGUS CULTURE WITH STAIN

## 2017-02-23 LAB — BASIC METABOLIC PANEL
ANION GAP: 6 (ref 5–15)
BUN: 13 mg/dL (ref 6–20)
CHLORIDE: 108 mmol/L (ref 101–111)
CO2: 20 mmol/L — ABNORMAL LOW (ref 22–32)
Calcium: 8 mg/dL — ABNORMAL LOW (ref 8.9–10.3)
Creatinine, Ser: 0.56 mg/dL — ABNORMAL LOW (ref 0.61–1.24)
GFR calc Af Amer: >60 mL/min (ref >60)
Glucose, Bld: 82 mg/dL (ref 65–99)
POTASSIUM: 3.4 mmol/L — AB (ref 3.5–5.1)
SODIUM: 134 mmol/L — AB (ref 135–145)

## 2017-02-23 LAB — FUNGAL ORGANISM REFLEX

## 2017-02-23 LAB — RETICULOCYTES
RBC.: 3.86 MIL/uL — ABNORMAL LOW (ref 4.22–5.81)
RETIC COUNT ABSOLUTE: 19.3 10*3/uL (ref 19.0–186.0)
Retic Ct Pct: 0.5 % (ref 0.4–3.1)

## 2017-02-23 LAB — FUNGUS CULTURE RESULT

## 2017-02-23 MED ORDER — POTASSIUM CHLORIDE 20 MEQ/15ML (10%) PO SOLN: 20.0000 meq | Freq: Two times a day (BID) | ORAL | Status: DC

## 2017-02-23 MED ORDER — POTASSIUM CHLORIDE IN NACL 40-0.9 MEQ/L-% IV SOLN
INTRAVENOUS | Status: DC
  Administered 2017-02-23 – 2017-03-15 (×19): 50 mL/h via INTRAVENOUS
  Filled 2017-02-23 (×26): qty 1000

## 2017-02-23 NOTE — Progress Notes (Signed)
Progress Note    William Pena  YSH:[id number] DOB: 1985/09/16  DOA: 02/15/2017 PCP: [name], MD    Brief Narrative:   Chief complaint: Follow-up encephalopathy  Medical records reviewed and are as summarized below:  William Pena is an 31 y.o. 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 Initial impression from ID was that he had relapsed cryptococcal meningitis.  Assessment/Plan:   Principal Problem:   Sepsis ([name] to cryptococcal meningeal encephalitis Being treated for relapsed cryptococcal meningitis with fluconazole. Will need 8 more weeks of therapy per ID recommendations. Vital signs currently stable with resolution of sepsis.Remains on fluconazole. No change in overall condition.  Active Problems:   AIDS (acquired immune deficiency syndrome) (HCC)/HIV infection Unable to start antiretroviral therapy at this time due to the risk of immune reconstitution inflammatory syndrome per ID. Palliative care consulted with goals of care meeting held 02/22/17, with full scope of care recommended due to differing opinions from family regarding how to proceed. Guardianship hearing scheduled for next week. Lengthy discussion held with palliative care regarding treatment planning for this patient. Continue Bactrim daily and azithromycin weekly to prevent opportunistic infections.    Encephalopathy acute Appears to be related to cryptococcal meningeal encephalitis. No change in condition today.    AKI (acute kidney injury) ([place]) Resolved.    Protein-calorie malnutrition, severe Continue supplements. Body mass index is 20.3 kg/m.    Anemia of chronic disease/thrombocytopenia/Leukopenia Hematology following. 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  Transfuse when necessary per recommendations. Continue to monitor blood counts closely.    3 full thickness wounds to bilat buttocks; 2X.5X.5cm, 1X1X1cm, 5.5X3X.2cm Wound care per WOC. Per her notes: "Pt had I&D of buttock abscesses in May with the surgical team, according to the EMR.  These wounds were present on admission but are NOT pressure injuries."    Deep tissue injuries bilateral heels Wound care per wound care team. Floating heels.    Hypokalemia We'll add 40 mEq of potassium to the patient's IV fluids.  Family Communication/Anticipated D/C date and plan/Code Status   DVT prophylaxis: SCDs ordered. Code Status: Full Code.  Family Communication: No family presently at the bedside. Disposition Plan: Unclear, suspect will need a skilled level of care.   Medical Consultants:    Infectious Disease  Palliative Care  Neurology  Oncology   Anti-Infectives:    Fluconazole 02/16/17--->  Septra resumed 02/22/17  Azithromycin resumed 02/22/17  Subjective:   Patient is nonverbal.  Objective:    Vitals:   02/23/17 0100 02/23/17 0400 02/23/17 0401 02/23/17 0727  BP: 123/83 117/75  128/84  Pulse:    (!) 51  Resp: 15 15  16   Temp: 97.8 F (36.6 C)  98.9 F (37.2 C) 98.4 F (36.9 C)  TempSrc: Axillary  Oral Axillary  SpO2: 98% 99%  100%  Weight:      Height:        Intake/Output Summary (Last 24 hours) at 02/23/17 0754 Last data filed at 02/23/17 0540  Gross per 24 hour  Intake             1510 ml  Output             1450 ml  Net               60 ml  Filed Weights   02/16/17 1407  Weight: 57.1 kg (125 lb 12.8 oz)    Exam:Unchanged from 02/22/17 except as noted below and red General exam: Emaciated appearing, chronically ill male who is asleep and resting quietly. Respiratory system: Lungs are diminished in the bases but otherwise clear. Cardiovascular system: Heart sounds are regular with an S1, S2. No murmurs, rubs, or gallops. Gastrointestinal  system: Abdomen is soft, nontender, nondistended with normal active bowel sounds. Central nervous system: Asleep. Nonresponsive verbally. Tracks. Right-sided hemiparesis. Extremities: No clubbing, edema, or cyanosis. Heel protectors in place. Skin: No rashes. Multiple tattoos. Psychiatry: Mood and affect flat. Unable to assess insight or judgment.  Data Reviewed:   I have personally reviewed following labs and imaging studies:  Labs: Labs show the following:   Basic Metabolic Panel:  Recent Labs Lab 02/17/17 0314 02/18/17 0611 02/19/17 0335 02/22/17 0711 02/23/17 0330  NA 141 136 137 137 134*  K 3.8 3.6 3.6 3.3* 3.4*  CL 118* 115* 111 111 108  CO2 15* 17* 20* 21* 20*  GLUCOSE 90 96 86 86 82  BUN 35* 23* 15 13 13   CREATININE 0.92 0.54* 0.53* 0.51* 0.56*  CALCIUM 8.7* 8.1* 8.4* 8.0* 8.0*   GFR Estimated Creatinine Clearance: 108.1 mL/min (A) (by C-G formula based on SCr of 0.56 mg/dL (L)). Liver Function Tests: No results for input(s): AST, ALT, ALKPHOS, BILITOT, PROT, ALBUMIN in the last 168 hours. No results for input(s): LIPASE, AMYLASE in the last 168 hours. No results for input(s): AMMONIA in the last 168 hours. Coagulation profile  Recent Labs Lab 02/17/17 2122  INR 1.20    CBC:  Recent Labs Lab 02/18/17 1944 02/19/17 0335 02/20/17 0359 02/21/17 0312 02/22/17 0711 02/23/17 0330  WBC 3.4* 3.4* 3.1* 2.3* 2.4* 3.2*  NEUTROABS 2.7  --  1.9 1.2* 1.4* 2.7  HGB 8.1*  8.1* 9.7* 10.3* 10.5* 10.5* 10.8*  HCT 25.6*  25.9* 29.9* 31.5* 32.6* 32.2* 33.1*  MCV 87.4 86.4 85.4 84.9 86.8 85.8  PLT 115* 108* 99* 115* 134* 136*   CBG:  Recent Labs Lab 02/16/17 1605  GLUCAP 84   Anemia work up:  Recent Labs  02/22/17 0711 02/23/17 0330  RETICCTPCT 0.5 0.5   Sepsis Labs:  Recent Labs Lab 02/16/17 1112  02/18/17 0611  02/20/17 0359 02/21/17 0312 02/22/17 0711 02/23/17 0330  PROCALCITON 1.01  --  0.22  --  <0.10  --   --   --   WBC  --   < > 3.7*   < > 3.1* 2.3* 2.4* 3.2*  < > = values in this interval not displayed.  Microbiology Recent Results (from the past 240 hour(s))  Culture, blood (Routine x 2)     Status: None   Collection Time: 02/15/17 12:45 PM  Result Value Ref Range Status   Specimen Description BLOOD LEFT ANTECUBITAL  Final   Special Requests   Final    BOTTLES DRAWN AEROBIC ONLY Blood Culture adequate volume   Culture NO GROWTH 5 DAYS  Final   Report Status 02/20/2017 FINAL  Final  Culture, blood (Routine x 2)     Status: None   Collection Time: 02/15/17 12:51 PM  Result Value Ref Range Status   Specimen Description BLOOD RIGHT ANTECUBITAL  Final   Special Requests IN PEDIATRIC BOTTLE Blood Culture adequate volume  Final   Culture NO GROWTH 5 DAYS  Final   Report Status 02/20/2017 FINAL  Final  CSF culture     Status: None  Collection Time: 02/16/17  1:29 PM  Result Value Ref Range Status   Specimen Description CSF  Final   Special Requests NONE  Final   Gram Stain   Final    WBC PRESENT, PREDOMINANTLY PMN YEAST CYTOSPIN SMEAR CRITICAL RESULT CALLED TO, READ BACK BY AND VERIFIED WITH: Candie Chroman RN AT [id number] ON [id number] BY SJW    Culture NO GROWTH 3 DAYS  Final   Report Status 02/19/2017 FINAL  Final  MRSA PCR Screening     Status: None   Collection Time: 02/16/17  2:44 PM  Result Value Ref Range Status   MRSA by PCR NEGATIVE NEGATIVE Final    Comment:        The GeneXpert MRSA Assay (FDA approved for NASAL specimens only), is one component of a comprehensive MRSA colonization surveillance program. It is not intended to diagnose MRSA infection nor to guide or monitor treatment for MRSA infections.   CSF culture     Status: None   Collection Time: 02/18/17  2:00 PM  Result Value Ref Range Status   Specimen Description CSF  Final   Special Requests TUBE 2  Final   Gram Stain   Final    WBC PRESENT,BOTH PMN AND MONONUCLEAR YEAST CYTOSPIN SMEAR CRITICAL RESULT CALLED TO, READ BACK BY AND VERIFIED  WITH: A. RODRIGUEZ RN, AT 1533 02/18/17 BY D. VANHOOK    Culture NO GROWTH 3 DAYS  Final   Report Status 02/21/2017 FINAL  Final  Culture, fungus without smear     Status: None (Preliminary result)   Collection Time: 02/18/17  2:00 PM  Result Value Ref Range Status   Specimen Description CSF  Final   Special Requests TUBE 2  Final   Culture NO FUNGUS ISOLATED AFTER 3 DAYS  Final   Report Status PENDING  Incomplete  CSF culture     Status: None (Preliminary result)   Collection Time: 02/22/17 11:43 AM  Result Value Ref Range Status   Specimen Description CSF  Final   Special Requests TUBE 2  Final   Gram Stain   Final    CYTOSPIN SMEAR WBC PRESENT, PREDOMINANTLY MONONUCLEAR YEAST CORRECTED RESULTS PREVIOUSLY REPORTED AS: NO ORGANISMS SEEN CORRECTED RESULTS CALLED TO: RN Gilman Buttner [id number] MLM    Culture PENDING  Incomplete   Report Status PENDING  Incomplete    Procedures and diagnostic studies:  Dg Fluoro Guide Lumbar Puncture  Result Date: 02/22/2017 CLINICAL DATA:  Cryptococcal meningitis. Aches. Previous elevated CSF pressures. EXAM: DIAGNOSTIC LUMBAR PUNCTURE UNDER FLUOROSCOPIC GUIDANCE FLUOROSCOPY TIME:  Fluoroscopy Time:  12 seconds Radiation Exposure Index (if provided by the fluoroscopic device): 7.7 uGy*m2 Number of Acquired Spot Images: 0 PROCEDURE: Informed consent was obtained from the patient prior to the procedure, including potential complications of headache, allergy, and pain. With the patient prone, the lower back was prepped with Betadine. 1% Lidocaine was used for local anesthesia. Lumbar puncture was performed at the right paramedian L2-3 level using a 20 gauge needle with return of clear CSF with an opening pressure of 16.5 cm water. 14.0 ml of CSF were obtained for laboratory studies. The patient tolerated the procedure well and there were no apparent complications. IMPRESSION: 1. Normal opening pressure of 16.5 cm water. 2. 14 mL of clear CSF was sent for  laboratory studies. Electronically Signed   By: San Morelle M.D.   On: 02/22/2017 12:02    Medications:   . amantadine  100 mg Oral BID WC  .  azithromycin  1,200 mg Oral Weekly  . feeding supplement (ENSURE ENLIVE)  237 mL Oral BID BM  . feeding supplement (PRO-STAT SUGAR FREE 64)  30 mL Oral BID  . multivitamin with minerals  1 tablet Oral Daily  . nystatin  5 mL Oral QID  . sulfamethoxazole-trimethoprim  1 tablet Oral Daily  . tamsulosin  0.4 mg Oral Daily   Continuous Infusions: . sodium chloride 50 mL/hr at 02/23/17 0736  . fluconazole (DIFLUCAN) IV Stopped (02/22/17 1604)    Medical decision making is of high complexity and this patient is at high risk of deterioration, therefore this is a level 3 visit.  (> 4 problem points, 1 data point, high risk: Need 2 out of 3)   Problems/DDx Points   Self limited or minor (max 2)       1   Established problem, stable       1  4+  Established problem, worsening       2   New problem, no additional W/U planned (max 1)       3   New problem, additional W/U planned        4    Data Reviewed Points   Review/order clinical lab tests       1  1  Review/order x-rays       1   Review/order tests (Echo, EKG, PFTs, etc)       1   Discussion of test results w/ performing MD       1   Independent review of image, tracing or specimen       2   Decision to obtain old records       1   Review and summation of old records       2    Level of risk Presenting prob Diagnostics Management   Minimal 1 self limited/minor Labs CXR EKG/EEG U/A U/S Rest Gargles Bandages Dressings   Low 2 or more self limited/minor 1 stable chronic Acute uncomplicated illness Tests (PFTS) Non-CV imaging Arterial labs Biopsies of skin OTC drugs Minor surgery-no risk PT OT IVF without additives    Moderate 1 or more chronic illnesses w/ mild exac, progression or S/E from tx 2 or more stable chronic illnesses Undiagnosed new problem w/ uncertain  prognosis Acute complicated injury  Stress tests Endoscopies with no risk factors Deep needle or incisional bx CV imaging without risk LP Thoracentesis Paracentesis Minor surgery w/ risks Elective major surgery w/ no risk (open, percutaneous or endoscopic) Prescription drugs Therapeutic nucl med IVF with additives Closed tx of fracture/dislocation    High Severe exac of chronic illness Acute or chronic illness/injury may pose a threat to life or bodily function (ARF) Change in neuro status    CV imaging w/ contrast and risk Cardio electophysiologic tests Endoscopies w/ risk Discography Elective major surgery Emergency major surgery Parenteral controlled substances Drug therapy req monitoring for toxicity DNR/de-escalation of care    MDM Prob points Data points Risk   Straightforward    <1    <1    Min   Low complexity    2    2    Low   Moderate    3    3    Mod   High Complexity    4 or more    4 or more    High      LOS: 8 days   RAMA,CHRISTINA  [facility] Pager [phone]. If unable  to reach me by pager, please call my cell phone at [phone].  *Please refer to [email or web address], password [email or web address] to get updated schedule on who will round on this patient, as hospitalists switch teams weekly. If 7PM-7AM, please contact night-coverage at [email or web address], password [email or web address] for any overnight needs.  02/23/2017, 7:54 AM

## 2017-02-23 NOTE — Progress Notes (Signed)
Daily Progress Note   Patient Name: William Pena       Date: 02/23/2017 DOB: Feb 14, 1986  Age: 31 y.o. MRN#: [id number] Attending Physician: Tonye Royalty, MD Primary Care Physician: [name], MD Admit Date: 02/15/2017  Reason for Consultation/Follow-up: Establishing goals of care, Interfamily conflict and Psychosocial/spiritual support  Subjective: William Pena is unchanged from yesterday. He continues to track with his eyes and is able to eat and drink with assist, but is otherwise not communicating or following commands. No signs of distress. His mother is at the bedside.   Length of Stay: 8  Current Medications: Scheduled Meds:  . amantadine  100 mg Oral BID WC  . azithromycin  1,200 mg Oral Weekly  . feeding supplement (ENSURE ENLIVE)  237 mL Oral BID BM  . feeding supplement (PRO-STAT SUGAR FREE 64)  30 mL Oral BID  . multivitamin with minerals  1 tablet Oral Daily  . nystatin  5 mL Oral QID  . sulfamethoxazole-trimethoprim  1 tablet Oral Daily  . tamsulosin  0.4 mg Oral Daily    Continuous Infusions: . 0.9 % NaCl with KCl 40 mEq / L    . fluconazole (DIFLUCAN) IV Stopped (02/22/17 1604)    PRN Meds: acetaminophen **OR** acetaminophen, HYDROcodone-acetaminophen, polyethylene glycol, polyvinyl alcohol  Physical Exam   HENT:  Head: Normocephalic and atraumatic.  Mucous membranes moist. Nose: Nose normal.  Eyes:  Right pupil slightly larger than left. Will track me. Neck: Normal range of motion. Neck supple.  Pulmonary/Chest: Effort normal. Breathing comfortably in bed, no signs of distress.  Musculoskeletal:  UTA, right fingers with rare spastic/jerks, no movement seen on RLE. LUE with occasional purposeful movement, no movement observed on LLE.   Neurological:  Pt is awake and will track me with his eyes. Eating  and drinking when fed. Not speaking or following commands.  Skin: Skin is warm and dry.  Extensive tatoos  Psychiatric:  Calm in bed, otherwise non-interactive         Vital Signs: BP 128/84   Pulse (!) 51   Temp 98.4 F (36.9 C) (Axillary)   Resp 16   Ht '5\' 6"'  (1.676 m)   Wt 57.1 kg (125 lb 12.8 oz)   SpO2 100%   BMI 20.30 kg/m  SpO2: SpO2: 100 % O2 Device: O2 Device: Not Delivered O2 Flow Rate: O2 Flow Rate (L/min): 2 L/min  Intake/output summary:  Intake/Output Summary (Last 24 hours) at 02/23/17 0918 Last data filed at 02/23/17 0540  Gross per 24 hour  Intake             1290 ml  Output             1450 ml  Net             -160 ml   LBM: Last BM Date: 02/21/17 Baseline Weight: Weight: 57.1 kg (125 lb 12.8 oz) Most recent weight: Weight: 57.1 kg (125 lb 12.8 oz)       Palliative Assessment/Data: PPS 30%   Flowsheet Rows     Most Recent Value  Intake Tab  Referral Department  Hospitalist  Unit at Time of Referral  ICU  Palliative Care Primary Diagnosis  Sepsis/Infectious Disease  Date Notified  02/21/17  Palliative Care Type  New Palliative care  Reason for referral  Clarify Goals of Care  Date of Admission  02/15/17  # of days IP prior to Palliative referral  6  Clinical Assessment  Psychosocial & Spiritual Assessment  Palliative Care Outcomes      Patient Active Problem List   Diagnosis Date Noted  . Hypokalemia 02/23/2017  . Leukopenia 02/22/2017  . Deep tissue injury bilateral heels 02/22/2017  . Wound of buttock 02/22/2017  . Goals of care, counseling/discussion   . Palliative care by specialist   . Sepsis ([place]) 02/16/2017  . Acute encephalopathy 02/15/2017  . Acute urinary retention 02/08/2017  . Genital herpes 02/08/2017  . Anemia of chronic disease 02/08/2017  . Constipation 02/08/2017  . Protein-calorie malnutrition, severe 01/28/2017  . Pressure injury of skin 01/16/2017  . AKI (acute kidney injury) ([place])   . HIV (human  immunodeficiency virus infection) ([place])   . Meningitis   . 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 Acute blood loss anemia   . Elevated intracranial pressure   . HIV disease ([place])   . Lymphadenopathy of head and neck   . Rectal abscess   . 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 Cavitary pneumonia   . Diffuse lymphadenopathy   . Drug rash   . Hypomagnesemia 12/23/2016  . Encephalopathy acute 12/23/2016  . SIRS (systemic inflammatory response syndrome) ([place]) 12/23/2016  . Acute respiratory failure ([place]) 12/23/2016  . Pulmonary infiltrates   . Perirectal abscess 12/21/2016  . Hyponatremia 12/21/2016  . Normocytic anemia 12/21/2016  . Elevated LFTs 12/21/2016  . AIDS (acquired immune deficiency syndrome) ([place]) 07/07/2016  . Chronic pain 06/20/2011    Palliative Care Assessment & Plan   HPI: 31 y.o. 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 He was recently here 4/82-5/0 with a complicated course due to disseminated cryptococcal infection and cryptococcal meningoencephalitis. He now presents from SNF with worsening encephalopathy and decreased oral intake. He was admitted on 02/15/2017. On admission he was was found to be septic in the setting of recurrent cryptococcal meningitis; ID following. He also had increased ICP for which he is having serial LPs to monitor (VP shunt to be considered if ICPs climb again); Neurology consulted and advised. 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 Palliative consulted to assist family in clarifying goals of care given the patient's recurrent health issues and now significant failure to thrive.    Assessment: I had a long conversation with both the patient's mother and father yesterday exploring their perceptions of William Pena wishes regarding goals of care and advanced directives. In accordance with the clarification provided by the patient's Guardian ad Litem William Pena [phone]), the statutory medical decision makers are his parents. Unfortunately, his parents have significantly different opinions on what William Pena would want.   Today, I followed up with William Pena and shared the conversation I had with the patient's father. I also talked her through the decision making process for William Pena. She is hopeful that the patient's grandmother (his father's mother), who is expected this weekend, will help his father understand the changes that have occurred and can facilitate a better conversation about quality of life and what William Pena would have wanted.  There is also a guardianship hearing on the 31st, which may be the fall back option for decision making if the two parents cannot come to consensus.   William Pena was very tearful and upset during our conversation today. She is concerned William Pena is suffering by having his life prolonged, and he would not want to live this way. She has been reading through the Hard Choices book I gave her yesterday. Emotional support provided, and I reassured her about the emphasis placed on ensuring his comfort as these issues are worked out.   Recommendations/Plan:  Full code, full scope care  Difficult situation as mother and father have very different perception's of their son's wishes. I suspect we will need time to let this play out. Grandmother to visit this weekend, which will help the father understand his state. Guardianship hearing next week.  Goals of Care and Additional Recommendations:  Limitations on Scope of Treatment: Full Scope Treatment  Code Status:  Full code  Prognosis:   Unable to determine  Discharge Planning:  To Be Determined  Care  plan was discussed with pt's mother and care nurse.  Thank you for allowing the Palliative Medicine Team to assist in the care of this patient.  Total time: 35 minutes    Greater than 50%  of this time was spent counseling and coordinating care related to the above assessment and plan.  Charlynn Court, NP Palliative Medicine Team [phone] pager (7a-5p) Team Phone # [phone]

## 2017-02-24 ENCOUNTER — Other Ambulatory Visit: Payer: Self-pay | Admitting: Internal Medicine

## 2017-02-24 DIAGNOSIS — B451 Cerebral cryptococcosis: Secondary | ICD-10-CM

## 2017-02-24 LAB — CBC WITH DIFFERENTIAL/PLATELET
BASOS ABS: 0 10*3/uL (ref 0.0–0.1)
BASOS PCT: 1 %
Eosinophils Absolute: 0 10*3/uL (ref 0.0–0.7)
Eosinophils Relative: 2 %
HEMATOCRIT: 34.8 % — AB (ref 39.0–52.0)
Hemoglobin: 11.5 g/dL — ABNORMAL LOW (ref 13.0–17.0)
LYMPHS PCT: 29 %
Lymphs Abs: 0.6 10*3/uL — ABNORMAL LOW (ref 0.7–4.0)
MCH: 28.1 pg (ref 26.0–34.0)
MCHC: 33 g/dL (ref 30.0–36.0)
MCV: 85.1 fL (ref 78.0–100.0)
Monocytes Absolute: 0.2 10*3/uL (ref 0.1–1.0)
Monocytes Relative: 8 %
NEUTROS ABS: 1.2 10*3/uL — AB (ref 1.7–7.7)
Neutrophils Relative %: 60 %
PLATELETS: 136 10*3/uL — AB (ref 150–400)
RBC: 4.09 MIL/uL — AB (ref 4.22–5.81)
RDW: 15.4 % (ref 11.5–15.5)
WBC: 1.9 10*3/uL — AB (ref 4.0–10.5)

## 2017-02-24 LAB — BASIC METABOLIC PANEL
ANION GAP: 5 (ref 5–15)
BUN: 11 mg/dL (ref 6–20)
CALCIUM: 8.4 mg/dL — AB (ref 8.9–10.3)
CO2: 21 mmol/L — ABNORMAL LOW (ref 22–32)
Chloride: 106 mmol/L (ref 101–111)
Creatinine, Ser: 0.58 mg/dL — ABNORMAL LOW (ref 0.61–1.24)
GFR calc Af Amer: >60 mL/min (ref >60)
Glucose, Bld: 82 mg/dL (ref 65–99)
POTASSIUM: 3.7 mmol/L (ref 3.5–5.1)
SODIUM: 132 mmol/L — AB (ref 135–145)

## 2017-02-24 LAB — RETICULOCYTES
RBC.: 4.09 MIL/uL — AB (ref 4.22–5.81)
RETIC COUNT ABSOLUTE: 20.5 10*3/uL (ref 19.0–186.0)
Retic Ct Pct: 0.5 % (ref 0.4–3.1)

## 2017-02-24 NOTE — Progress Notes (Signed)
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  Limited response  Physical Exam: Constitutional:  Vitals:   02/24/17 0300 02/24/17 0729  BP: 133/77 116/84  Pulse: (!) 56 (!) 54  Resp: 13 15  Temp: (!) 97.5 F (36.4 C) (!) 97.5 F (36.4 C)   patient sleeping but arousable, eyes open, does not respond to commands Respiratory: Normal respiratory effort; CTA B Cardiovascular: RRR GI: soft, nt, nd  Review of Systems: Unable to be assessed due to mental status  Lab Results  Component Value Date   WBC 1.9 (L) 02/24/2017   HGB 11.5 (L) 02/24/2017   HCT 34.8 (L) 02/24/2017   MCV 85.1 02/24/2017   PLT 136 (L) 02/24/2017    Lab Results  Component Value Date   CREATININE 0.58 (L) 02/24/2017   BUN 11 02/24/2017   NA 132 (L) 02/24/2017   K 3.7 02/24/2017   CL 106 02/24/2017   CO2 21 (L) 02/24/2017    Lab Results  Component Value Date   ALT 31 02/16/2017   AST 35 02/16/2017   ALKPHOS 94 02/16/2017     Microbiology: Recent Results (from the past 240 hour(s))  Culture, blood (Routine x 2)     Status: None   Collection Time: 02/15/17 12:45 PM  Result Value Ref Range Status   Specimen Description BLOOD LEFT ANTECUBITAL  Final   Special Requests   Final    BOTTLES DRAWN AEROBIC ONLY Blood Culture adequate volume   Culture NO GROWTH 5 DAYS  Final   Report Status 02/20/2017 FINAL  Final  Culture, blood (Routine x 2)     Status: None   Collection Time: 02/15/17 12:51 PM  Result Value Ref Range Status   Specimen Description BLOOD RIGHT ANTECUBITAL  Final   Special Requests IN PEDIATRIC BOTTLE Blood Culture adequate volume  Final   Culture NO GROWTH 5 DAYS  Final   Report Status 02/20/2017 FINAL  Final  CSF culture     Status: None   Collection Time: 02/16/17  1:29 PM  Result Value Ref Range Status   Specimen Description CSF  Final   Special Requests NONE  Final   Gram Stain    Final    WBC PRESENT, PREDOMINANTLY PMN YEAST CYTOSPIN SMEAR CRITICAL RESULT CALLED TO, READ BACK BY AND VERIFIED WITH: Candie Chroman RN AT 3734 ON [id number] BY SJW    Culture NO GROWTH 3 DAYS  Final   Report Status 02/19/2017 FINAL  Final  MRSA PCR Screening     Status: None   Collection Time: 02/16/17  2:44 PM  Result Value Ref Range Status   MRSA by PCR NEGATIVE NEGATIVE Final    Comment:        The GeneXpert MRSA Assay (FDA approved for NASAL specimens only), is one component of a comprehensive MRSA colonization surveillance program. It is not intended to diagnose MRSA infection nor to guide or monitor treatment for MRSA infections.   CSF culture     Status: None   Collection Time: 02/18/17  2:00 PM  Result Value Ref Range Status   Specimen Description CSF  Final   Special Requests TUBE 2  Final   Gram Stain   Final    WBC PRESENT,BOTH PMN AND MONONUCLEAR YEAST CYTOSPIN SMEAR CRITICAL RESULT CALLED TO, READ BACK BY AND VERIFIED WITH: A. RODRIGUEZ RN, AT 1572 02/18/17 BY D. Victoriano Lain  Culture NO GROWTH 3 DAYS  Final   Report Status 02/21/2017 FINAL  Final  Culture, fungus without smear     Status: None (Preliminary result)   Collection Time: 02/18/17  2:00 PM  Result Value Ref Range Status   Specimen Description CSF  Final   Special Requests TUBE 2  Final   Culture NO FUNGUS ISOLATED AFTER 5 DAYS  Final   Report Status PENDING  Incomplete  CSF culture     Status: None (Preliminary result)   Collection Time: 02/22/17 11:43 AM  Result Value Ref Range Status   Specimen Description CSF  Final   Special Requests TUBE 2  Final   Gram Stain   Final    CYTOSPIN SMEAR WBC PRESENT, PREDOMINANTLY MONONUCLEAR YEAST CORRECTED RESULTS PREVIOUSLY REPORTED AS: NO ORGANISMS SEEN CORRECTED RESULTS CALLED TO: RN Gilman Buttner [id number] MLM    Culture NO GROWTH < 24 HOURS  Final   Report Status PENDING  Incomplete    Impression/Plan:  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  He will need prolonged fluconazole the next 6-12 months.    2. Increased ICP - normalized after one tap.  In discussion with neurology, we have decided to repeat the lumbar puncture weekly x 2 (approximately on August 1st and August 8th) by IR and if the opening pressure remains normal, will not repeat. If he has significantly elevated ICP again, neurology and [place] in ID recommend engaging neurosurgery for consideration of VPS.    3.  Pancytopenia - likely from poorly controlled HIV. Transfuse as needed.   4.  HIV/AIDS - can continue with Bactrim DS once daily and weekly 1200 mg azithromycin  Holding off on starting ARVs for now due to #1.  Will consider if ICP remains stable.    5. GOC - I agree that this is a very hard road ahead that will be unlikely to have a positive outcome and continued efforts at palliative care is optimal.     He already has an appt with Dr. Baxter Flattery for follow up on August 14.    I put in the outpatient order for the lumbar puncture via IR for August 1 and 8 if he is discharged if the facility can schedule the time.    Dr. Johnnye Sima is available over the weekend if needed, I will otherwise follow up on Monday if he is still inpatient.

## 2017-02-24 NOTE — Progress Notes (Signed)
Progress Note    William Pena  OHY:[id number] DOB: [birth year]  DOA: 02/15/2017 PCP: [name], MD    Brief Narrative:   Chief complaint: Follow-up encephalopathy  Medical records reviewed and are as summarized below:  William Pena is an 31 y.o. 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 Initial impression from ID was that he had relapsed cryptococcal meningitis.  Assessment/Plan:   Principal Problem:   Sepsis ([name] to cryptococcal meningeal encephalitis Being treated for relapsed cryptococcal meningitis with fluconazole. Will need 8 more weeks of therapy per ID recommendations. Vital signs currently stable with resolution of sepsis.Remains on fluconazole. No change in overall condition.  Active Problems:   AIDS (acquired immune deficiency syndrome) (HCC)/HIV infection Unable to start antiretroviral therapy at this time due to the risk of immune reconstitution inflammatory syndrome per ID. Palliative care consulted with goals of care meeting held 02/22/17, with full scope of care recommended due to differing opinions from family regarding how to proceed. Guardianship hearing scheduled for next week. Lengthy discussion held with palliative care regarding treatment planning for this patient7/26/18. Team will meet with family again next week to discuss goals of care again, but for now he remains a full code with full scope of treatment. Continue Bactrim daily and azithromycin weekly to prevent opportunistic infections.    Encephalopathy acute Appears to be related to cryptococcal meningeal encephalitis. No change in condition today.    AKI (acute kidney injury) ([place]) Resolved.    Protein-calorie malnutrition, severe Continue supplements. Body mass  index is 20.3 kg/m.    Anemia of chronic disease/thrombocytopenia/Leukopenia Hematology following. 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 Transfuse when necessary per recommendations. Continue to monitor blood counts closely. Leukopenia worse, but hemoglobin and platelet count are stable.    3 full thickness wounds to bilat buttocks; 2X.5X.5cm, 1X1X1cm, 5.5X3X.2cm Wound care per WOC. Per her notes: "Pt had I&D of buttock abscesses in May with the surgical team, according to the EMR.  These wounds were present on admission but are NOT pressure injuries."    Deep tissue injuries bilateral heels Wound care per wound care team. Floating heels.    Hypokalemia Potassium has been added to IV fluids, and has normalized.  Family Communication/Anticipated D/C date and plan/Code Status   DVT prophylaxis: SCDs ordered. Code Status: Full Code.  Family Communication: No family presently at the bedside. Disposition Plan: Unclear, suspect will need a skilled level of care.   Medical Consultants:    Infectious Disease  Palliative Care  Neurology  Oncology   Anti-Infectives:    Fluconazole 02/16/17--->  Septra resumed 02/22/17  Azithromycin resumed 02/22/17  Subjective:   Patient is nonverbal.  Objective:    Vitals:   02/23/17 2200 02/23/17 2300 02/24/17 0300 02/24/17 0729  BP: 116/75 125/70 133/77 116/84  Pulse: 66 60 (!) 56 (!) 54  Resp: 16 16 13 15   Temp:  97.8 F (36.6 C) (!) 97.5 F (36.4 C) (!) 97.5 F (36.4 C)  TempSrc:  Axillary Axillary Oral  SpO2: 98% 98% 98% 99%  Weight:      Height:        Intake/Output Summary (Last 24 hours) at 02/24/17 0745 Last data filed at 02/24/17 0555  Gross per 24 hour  Intake          1996.67 ml  Output             2400 ml  Net          -403.33 ml   Filed Weights   02/16/17 1407  Weight: 57.1 kg (125 lb 12.8 oz)    Exam:Unchanged from 02/23/17 except as noted below and red General exam: Emaciated appearing,  chronically ill male who is awake with eyes open and resting quietly. Respiratory system: Lungs are diminished in the bases but otherwise clear. Cardiovascular system: Heart sounds are regular with an S1, S2. No murmurs, rubs, or gallops. Gastrointestinal system: Abdomen is soft, nontender, nondistended with normal active bowel sounds. Central nervous system: Awake. Nonresponsive verbally. Tracks inconsistently. Right-sided hemiparesis. Extremities: No clubbing, edema, or cyanosis. Heel protectors in place. Skin: No rashes. Multiple tattoos. Scleral erythema. Psychiatry: Mood and affect flat. Unable to assess insight or judgment.  Data Reviewed:   I have personally reviewed following labs and imaging studies:  Labs: Labs show the following:   Basic Metabolic Panel:  Recent Labs Lab 02/18/17 0611 02/19/17 0335 02/22/17 0711 02/23/17 0330 02/24/17 0333  NA 136 137 137 134* 132*  K 3.6 3.6 3.3* 3.4* 3.7  CL 115* 111 111 108 106  CO2 17* 20* 21* 20* 21*  GLUCOSE 96 86 86 82 82  BUN 23* 15 13 13 11   CREATININE 0.54* 0.53* 0.51* 0.56* 0.58*  CALCIUM 8.1* 8.4* 8.0* 8.0* 8.4*   GFR Estimated Creatinine Clearance: 108.1 mL/min (A) (by C-G formula based on SCr of 0.58 mg/dL (L)). Liver Function Tests: No results for input(s): AST, ALT, ALKPHOS, BILITOT, PROT, ALBUMIN in the last 168 hours. No results for input(s): LIPASE, AMYLASE in the last 168 hours. No results for input(s): AMMONIA in the last 168 hours. Coagulation profile  Recent Labs Lab 02/17/17 2122  INR 1.20    CBC:  Recent Labs Lab 02/20/17 0359 02/21/17 0312 02/22/17 0711 02/23/17 0330 02/24/17 0333  WBC 3.1* 2.3* 2.4* 3.2* 1.9*  NEUTROABS 1.9 1.2* 1.4* 2.7 1.2*  HGB 10.3* 10.5* 10.5* 10.8* 11.5*  HCT 31.5* 32.6* 32.2* 33.1* 34.8*  MCV 85.4 84.9 86.8 85.8 85.1  PLT 99* 115* 134* 136* 136*   CBG: No results for input(s): GLUCAP in the last 168 hours. Anemia work up:  Recent Labs  02/23/17 0330  02/24/17 0333  RETICCTPCT 0.5 0.5   Sepsis Labs:  Recent Labs Lab 02/18/17 0611  02/20/17 0359 02/21/17 0312 02/22/17 0711 02/23/17 0330 02/24/17 0333  PROCALCITON 0.22  --  <0.10  --   --   --   --   WBC 3.7*  < > 3.1* 2.3* 2.4* 3.2* 1.9*  < > = values in this interval not displayed.  Microbiology Recent Results (from the past 240 hour(s))  Culture, blood (Routine x 2)     Status: None   Collection Time: 02/15/17 12:45 PM  Result Value Ref Range Status   Specimen Description BLOOD LEFT ANTECUBITAL  Final   Special Requests   Final    BOTTLES DRAWN AEROBIC ONLY Blood Culture adequate volume   Culture NO GROWTH 5 DAYS  Final   Report Status 02/20/2017 FINAL  Final  Culture, blood (Routine x 2)     Status: None   Collection Time: 02/15/17 12:51 PM  Result Value Ref Range Status   Specimen Description BLOOD RIGHT ANTECUBITAL  Final   Special Requests IN PEDIATRIC BOTTLE Blood Culture adequate volume  Final   Culture NO GROWTH 5 DAYS  Final   Report  Status 02/20/2017 FINAL  Final  CSF culture     Status: None   Collection Time: 02/16/17  1:29 PM  Result Value Ref Range Status   Specimen Description CSF  Final   Special Requests NONE  Final   Gram Stain   Final    WBC PRESENT, PREDOMINANTLY PMN YEAST CYTOSPIN SMEAR CRITICAL RESULT CALLED TO, READ BACK BY AND VERIFIED WITH: Candie Chroman RN AT [id number] ON [id number] BY SJW    Culture NO GROWTH 3 DAYS  Final   Report Status 02/19/2017 FINAL  Final  MRSA PCR Screening     Status: None   Collection Time: 02/16/17  2:44 PM  Result Value Ref Range Status   MRSA by PCR NEGATIVE NEGATIVE Final    Comment:        The GeneXpert MRSA Assay (FDA approved for NASAL specimens only), is one component of a comprehensive MRSA colonization surveillance program. It is not intended to diagnose MRSA infection nor to guide or monitor treatment for MRSA infections.   CSF culture     Status: None   Collection Time: 02/18/17  2:00 PM  Result  Value Ref Range Status   Specimen Description CSF  Final   Special Requests TUBE 2  Final   Gram Stain   Final    WBC PRESENT,BOTH PMN AND MONONUCLEAR YEAST CYTOSPIN SMEAR CRITICAL RESULT CALLED TO, READ BACK BY AND VERIFIED WITH: A. RODRIGUEZ RN, AT 1533 02/18/17 BY D. VANHOOK    Culture NO GROWTH 3 DAYS  Final   Report Status 02/21/2017 FINAL  Final  Culture, fungus without smear     Status: None (Preliminary result)   Collection Time: 02/18/17  2:00 PM  Result Value Ref Range Status   Specimen Description CSF  Final   Special Requests TUBE 2  Final   Culture NO FUNGUS ISOLATED AFTER 5 DAYS  Final   Report Status PENDING  Incomplete  CSF culture     Status: None (Preliminary result)   Collection Time: 02/22/17 11:43 AM  Result Value Ref Range Status   Specimen Description CSF  Final   Special Requests TUBE 2  Final   Gram Stain   Final    CYTOSPIN SMEAR WBC PRESENT, PREDOMINANTLY MONONUCLEAR YEAST CORRECTED RESULTS PREVIOUSLY REPORTED AS: NO ORGANISMS SEEN CORRECTED RESULTS CALLED TO: RN Gilman Buttner [id number] MLM    Culture NO GROWTH < 24 HOURS  Final   Report Status PENDING  Incomplete    Procedures and diagnostic studies:  Dg Fluoro Guide Lumbar Puncture  Result Date: 02/22/2017 CLINICAL DATA:  Cryptococcal meningitis. Aches. Previous elevated CSF pressures. EXAM: DIAGNOSTIC LUMBAR PUNCTURE UNDER FLUOROSCOPIC GUIDANCE FLUOROSCOPY TIME:  Fluoroscopy Time:  12 seconds Radiation Exposure Index (if provided by the fluoroscopic device): 7.7 uGy*m2 Number of Acquired Spot Images: 0 PROCEDURE: Informed consent was obtained from the patient prior to the procedure, including potential complications of headache, allergy, and pain. With the patient prone, the lower back was prepped with Betadine. 1% Lidocaine was used for local anesthesia. Lumbar puncture was performed at the right paramedian L2-3 level using a 20 gauge needle with return of clear CSF with an opening pressure of  16.5 cm water. 14.0 ml of CSF were obtained for laboratory studies. The patient tolerated the procedure well and there were no apparent complications. IMPRESSION: 1. Normal opening pressure of 16.5 cm water. 2. 14 mL of clear CSF was sent for laboratory studies. Electronically Signed   By: Wynetta Fines.D.  On: 02/22/2017 12:02    Medications:   . amantadine  100 mg Oral BID WC  . azithromycin  1,200 mg Oral Weekly  . feeding supplement (ENSURE ENLIVE)  237 mL Oral BID BM  . feeding supplement (PRO-STAT SUGAR FREE 64)  30 mL Oral BID  . multivitamin with minerals  1 tablet Oral Daily  . nystatin  5 mL Oral QID  . sulfamethoxazole-trimethoprim  1 tablet Oral Daily  . tamsulosin  0.4 mg Oral Daily   Continuous Infusions: . 0.9 % NaCl with KCl 40 mEq / L 50 mL/hr (02/24/17 0511)  . fluconazole (DIFLUCAN) IV Stopped (02/23/17 1608)    Medical decision making is of high complexity and this patient is at high risk of deterioration, therefore this is a level 3 visit.  (> 4 problem points, 1 data point, high risk: Need 2 out of 3)   Problems/DDx Points   Self limited or minor (max 2)       1   Established problem, stable       1  4+  Established problem, worsening       2   New problem, no additional W/U planned (max 1)       3   New problem, additional W/U planned        4    Data Reviewed Points   Review/order clinical lab tests       1  1  Review/order x-rays       1   Review/order tests (Echo, EKG, PFTs, etc)       1   Discussion of test results w/ performing MD       1   Independent review of image, tracing or specimen       2   Decision to obtain old records       1   Review and summation of old records       2    Level of risk Presenting prob Diagnostics Management   Minimal 1 self limited/minor Labs CXR EKG/EEG U/A U/S Rest Gargles Bandages Dressings   Low 2 or more self limited/minor 1 stable chronic Acute uncomplicated illness Tests (PFTS) Non-CV  imaging Arterial labs Biopsies of skin OTC drugs Minor surgery-no risk PT OT IVF without additives    Moderate 1 or more chronic illnesses w/ mild exac, progression or S/E from tx 2 or more stable chronic illnesses Undiagnosed new problem w/ uncertain prognosis Acute complicated injury  Stress tests Endoscopies with no risk factors Deep needle or incisional bx CV imaging without risk LP Thoracentesis Paracentesis Minor surgery w/ risks Elective major surgery w/ no risk (open, percutaneous or endoscopic) Prescription drugs Therapeutic nucl med IVF with additives Closed tx of fracture/dislocation    High Severe exac of chronic illness Acute or chronic illness/injury may pose a threat to life or bodily function (ARF) Change in neuro status    CV imaging w/ contrast and risk Cardio electophysiologic tests Endoscopies w/ risk Discography Elective major surgery Emergency major surgery Parenteral controlled substances Drug therapy req monitoring for toxicity DNR/de-escalation of care    MDM Prob points Data points Risk   Straightforward    <1    <1    Min   Low complexity    2    2    Low   Moderate    3    3    Mod   High Complexity    4 or more  4 or more    High      LOS: 9 days   Mathieu Schloemer  [facility] Pager [phone]. If unable to reach me by pager, please call my cell phone at [phone].  *Please refer to [email or web address], password [email or web address] to get updated schedule on who will round on this patient, as hospitalists switch teams weekly. If 7PM-7AM, please contact night-coverage at [email or web address], password [email or web address] for any overnight needs.  02/24/2017, 7:45 AM

## 2017-02-24 NOTE — Progress Notes (Signed)
   Met w/ mother at bedside.   Introduced Art gallery manager.  Will follow, as needed.

## 2017-02-24 NOTE — Progress Notes (Signed)
Daily Progress Note   Patient Name: William Pena       Date: 02/24/2017 DOB: Jun 27, 1986  Age: 31 y.o. MRN#: [id number] Attending Physician: Tonye Royalty, MD Primary Care Physician: [name], MD Admit Date: 02/15/2017  Reason for Consultation/Follow-up: Establishing goals of care, Interfamily conflict and Psychosocial/spiritual support  Subjective: Teddrick is more lethargic today and required loud verbal stimuli to open his eyes. Once awake, he did briefly track me with his eyes before falling back asleep. Not communicating or following commands. No signs of distress or discomfort.   Length of Stay: 9  Current Medications: Scheduled Meds:  . amantadine  100 mg Oral BID WC  . azithromycin  1,200 mg Oral Weekly  . feeding supplement (ENSURE ENLIVE)  237 mL Oral BID BM  . feeding supplement (PRO-STAT SUGAR FREE 64)  30 mL Oral BID  . multivitamin with minerals  1 tablet Oral Daily  . nystatin  5 mL Oral QID  . sulfamethoxazole-trimethoprim  1 tablet Oral Daily  . tamsulosin  0.4 mg Oral Daily    Continuous Infusions: . 0.9 % NaCl with KCl 40 mEq / L 50 mL/hr (02/24/17 0511)  . fluconazole (DIFLUCAN) IV Stopped (02/23/17 1608)    PRN Meds: acetaminophen **OR** acetaminophen, HYDROcodone-acetaminophen, polyethylene glycol, polyvinyl alcohol  Physical Exam   Constitution: Frail man lying in bed. HENT:  Head: Normocephalic and atraumatic.  Mucous membranes moist. Nose: Nose normal.  Eyes:  Right pupil slightly larger than left. Will track me briefly  Neck: Normal range of motion. Neck supple.  Pulmonary/Chest: Effort normal. Breathing comfortably in bed, no signs of distress.  Musculoskeletal:  UTA, right fingers with rare spastic/jerks, no movement seen on RLE. LUE with occasional purposeful movement, no movement  observed on LLE. Significant muscle wasting Neurological:  Pt is lethargic. He did wake briefly after loud verbal stimuli. Not speaking or following commands.  Skin: Skin is warm and dry.  Extensive tatoos  Psychiatric:  Calm in bed, otherwise non-interactive         Vital Signs: BP 116/84   Pulse (!) 54   Temp (!) 97.5 F (36.4 C) (Oral)   Resp 15   Ht '5\' 6"'  (1.676 m)   Wt 57.1 kg (125 lb 12.8 oz)   SpO2 99%   BMI 20.30 kg/m  SpO2: SpO2: 99 % O2 Device: O2 Device: Nasal Cannula O2 Flow Rate: O2 Flow Rate (L/min): 2 L/min  Intake/output summary:   Intake/Output Summary (Last 24 hours) at 02/24/17 0934 Last data filed at 02/24/17 0555  Gross per 24 hour  Intake          1636.67 ml  Output             2400 ml  Net          -763.33 ml   LBM: Last BM Date: 02/23/17 Baseline Weight: Weight: 57.1 kg (125 lb 12.8 oz) Most recent weight: Weight: 57.1 kg (125 lb 12.8 oz)       Palliative Assessment/Data: PPS 30%   Flowsheet Rows     Most Recent Value  Intake Tab  Referral Department  Hospitalist  Unit at Time of Referral  ICU  Palliative  Care Primary Diagnosis  Sepsis/Infectious Disease  Date Notified  02/21/17  Palliative Care Type  New Palliative care  Reason for referral  Clarify Goals of Care  Date of Admission  02/15/17  # of days IP prior to Palliative referral  6  Clinical Assessment  Psychosocial & Spiritual Assessment  Palliative Care Outcomes      Patient Active Problem List   Diagnosis Date Noted  . Hypokalemia 02/23/2017  . Encephalopathy   . Leukopenia 02/22/2017  . Deep tissue injury bilateral heels 02/22/2017  . Wound of buttock 02/22/2017  . Goals of care, counseling/discussion   . Palliative care by specialist   . Sepsis ([place]) 02/16/2017  . Acute encephalopathy 02/15/2017  . Acute urinary retention 02/08/2017  . Genital herpes 02/08/2017  . Anemia of chronic disease 02/08/2017  . Constipation 02/08/2017  . Protein-calorie malnutrition,  severe 01/28/2017  . Pressure injury of skin 01/16/2017  . AKI (acute kidney injury) ([place])   . HIV (human immunodeficiency virus infection) ([place])   . Meningitis   . 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 Acute blood loss anemia   . Elevated intracranial pressure   . HIV disease ([place])   . Lymphadenopathy of head and neck   . Rectal abscess   . 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)   . 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 Cavitary pneumonia   . Diffuse lymphadenopathy   . Drug rash   . Hypomagnesemia 12/23/2016  . Encephalopathy acute 12/23/2016  . SIRS (systemic inflammatory response syndrome) ([place]) 12/23/2016  . Acute respiratory failure ([place]) 12/23/2016  . Pulmonary infiltrates   . Perirectal abscess 12/21/2016  . Hyponatremia 12/21/2016  . Normocytic anemia 12/21/2016  . Elevated LFTs 12/21/2016  . AIDS (acquired immune deficiency syndrome) ([place]) 07/07/2016  . Chronic pain 06/20/2011    Palliative Care Assessment & Plan   HPI: 31 y.o. 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 He was recently here 7/59-1/6 with a complicated course due to disseminated cryptococcal infection and cryptococcal meningoencephalitis. He now presents from SNF with worsening encephalopathy and decreased oral intake. He was admitted on 02/15/2017. On admission he was was found to be septic in the setting of recurrent cryptococcal meningitis; ID following. He also had increased ICP for which he is having serial LPs to monitor (VP shunt to be considered if ICPs climb again); Neurology consulted and advised. 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 Palliative consulted to assist family in clarifying goals  of care given the patient's recurrent health issues and now significant failure to thrive.   Assessment: I had a long conversation with both the patient's mother and father 7/25 exploring their perceptions of Ramello's wishes regarding goals of care and advanced directives. In accordance with the clarification provided by the patient's Guardian ad Litem [place] Score [phone]), the statutory medical decision makers are his parents. Unfortunately, his parents have significantly different opinions on what Khamani would want.   On 7/26, I followed up with [name] and shared the conversation I had with the patient's father. I also talked her through the decision making process for [place]. She is hopeful that the patient's grandmother (his father's mother), who is expected this weekend, will help his father understand the changes that have occurred and can facilitate a better conversation about  quality of life and what Anthonio would have wanted. There is also a guardianship hearing on the 31st, which may be the fall back option for decision making if the two parents cannot come to consensus. Unfortunately, [name] is very concerned that Jayten is suffering by having his life prolonged, and she is concerned that he would not want to live this way. She has been reading through the Hard Choices book.  Today, Richards is slightly more lethargic, however relatively unchanged from yesterday. I spoke with [name] over the phone. She relates feeling tired and overwhelmed by the situation. She is hopeful that Livio's grandmother will be able to see him this weekend and can help facilitate a conversation between herself and the patient's father. Her goal is to "put their heads together" to "discuss "what needs to happen." She was very appreciative of the support she has received from the hospital staff.  Recommendations/Plan:  Full code, full scope care  Difficult situation as mother and father have very different  perception's of their son's wishes. I suspect we will need time to let this play out. Grandmother to visit this weekend, which will help the father understand his state. Guardianship hearing next week.  Palliative to follow-up on Monday to continue the goals of care and code status discussion. If any questions or concerns arise in the interim, please call our team phone at [phone].  Goals of Care and Additional Recommendations:  Limitations on Scope of Treatment: Full Scope Treatment  Code Status:  Full code  Prognosis:   Unable to determine  Discharge Planning:  To Be Determined  Care plan was discussed with pt's mother.  Thank you for allowing the Palliative Medicine Team to assist in the care of this patient.  Total time: 25 minutes    Greater than 50%  of this time was spent counseling and coordinating care related to the above assessment and plan.  Charlynn Court, NP Palliative Medicine Team [phone] pager (7a-5p) Team Phone # [phone]

## 2017-02-25 LAB — CBC WITH DIFFERENTIAL/PLATELET
BASOS ABS: 0 10*3/uL (ref 0.0–0.1)
Basophils Relative: 0 %
EOS ABS: 0 10*3/uL (ref 0.0–0.7)
EOS PCT: 2 %
HCT: 30.7 % — ABNORMAL LOW (ref 39.0–52.0)
Hemoglobin: 9.9 g/dL — ABNORMAL LOW (ref 13.0–17.0)
Lymphocytes Relative: 36 %
Lymphs Abs: 0.8 10*3/uL (ref 0.7–4.0)
MCH: 27.7 pg (ref 26.0–34.0)
MCHC: 32.2 g/dL (ref 30.0–36.0)
MCV: 85.8 fL (ref 78.0–100.0)
MONO ABS: 0.3 10*3/uL (ref 0.1–1.0)
Monocytes Relative: 13 %
Neutro Abs: 1.1 10*3/uL — ABNORMAL LOW (ref 1.7–7.7)
Neutrophils Relative %: 49 %
PLATELETS: 129 10*3/uL — AB (ref 150–400)
RBC: 3.58 MIL/uL — AB (ref 4.22–5.81)
RDW: 15.6 % — ABNORMAL HIGH (ref 11.5–15.5)
WBC: 2.3 10*3/uL — AB (ref 4.0–10.5)

## 2017-02-25 LAB — RETICULOCYTES
RBC.: 3.58 MIL/uL — ABNORMAL LOW (ref 4.22–5.81)
RETIC CT PCT: 0.5 % (ref 0.4–3.1)
Retic Count, Absolute: 17.9 10*3/uL — ABNORMAL LOW (ref 19.0–186.0)

## 2017-02-25 LAB — CSF CULTURE W GRAM STAIN

## 2017-02-25 LAB — CSF CULTURE: CULTURE: NO GROWTH

## 2017-02-25 MED ORDER — HYPROMELLOSE (GONIOSCOPIC) 2.5 % OP SOLN: 1.0000 [drp] | Freq: Four times a day (QID) | OPHTHALMIC | Status: DC

## 2017-02-25 MED ORDER — POLYVINYL ALCOHOL 1.4 % OP SOLN
1.0000 [drp] | Freq: Four times a day (QID) | OPHTHALMIC | Status: DC
  Administered 2017-02-25 – 2017-03-17 (×77): 1 [drp] via OPHTHALMIC
  Filled 2017-02-25 (×4): qty 15

## 2017-02-25 NOTE — Progress Notes (Signed)
Progress Note    William Pena  AST:[id number] DOB: [birth year]  DOA: 02/15/2017 PCP: [name], MD    Brief Narrative:   Chief complaint: Follow-up encephalopathy  Medical records reviewed and are as summarized below:  William Pena is an 31 y.o. 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 Initial impression from ID was that he had relapsed cryptococcal meningitis.  Assessment/Plan:   Principal Problem:   Sepsis ([name] to cryptococcal meningeal encephalitis Being treated for relapsed cryptococcal meningitis with fluconazole. Will need 8 weeks of therapy per ID (started 02/16/17 with stopping date 04/13/17), and will likely need 200 mg of fluconazole as oral therapy for another 10 months to equal 12 months of treatment. Vital signs currently stable with resolution of sepsis. No change in overall condition.  Active Problems:   AIDS (acquired immune deficiency syndrome) (HCC)/HIV infection Unable to start antiretroviral therapy at this time due to the risk of immune reconstitution inflammatory syndrome per ID. Palliative care consulted with goals of care meeting held 02/22/17, with full scope of care recommended due to differing opinions from family regarding how to proceed. Guardianship hearing scheduled for next week. Lengthy discussion held with palliative care regarding treatment planning for this patient 02/23/17. Team will meet with family again next week to discuss goals of care again, but for now he remains a full code with full scope of treatment. Continue Bactrim daily and azithromycin weekly to prevent opportunistic infections.    Encephalopathy acute Appears to be related to cryptococcal meningeal encephalitis. No change in condition today.  Remains non-verbal.    AKI (acute kidney injury) ([place]) Resolved.    Protein-calorie malnutrition, severe Continue supplements. Body mass index is 20.3 kg/m. RN reports he eats well.    Anemia of chronic disease/thrombocytopenia/Leukopenia Hematology following. 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 Transfuse when necessary per recommendations. Continue to monitor blood counts closely. Leukopenia slightly improved, but hemoglobin dropping and platelet count stable. No need to transfuse today.    3 full thickness wounds to bilat buttocks; 2X.5X.5cm, 1X1X1cm, 5.5X3X.2cm Wound care per WOC. Per her notes: "Pt had I&D of buttock abscesses in May with the surgical team, according to the EMR.  These wounds were present on admission but are NOT pressure injuries."    Deep tissue injuries bilateral heels Wound care per wound care team. Floating heels.    Hypokalemia Potassium has been added to IV fluids, and has normalized.    Hyponatremia Mild, monitor.  Family Communication/Anticipated D/C date and plan/Code Status   DVT prophylaxis: SCDs ordered. Code Status: Full Code.  Family Communication: No family presently at the bedside. Disposition Plan: Will need a skilled level of care once goals of care established unless family opts for comfort, in which case residential hospice would be appropriate.   Medical Consultants:    Infectious Disease  Palliative Care  Neurology  Oncology   Anti-Infectives:    Fluconazole 02/16/17--->  Septra resumed 02/22/17  Azithromycin resumed 02/22/17  Subjective:   Patient is nonverbal.  Objective:    Vitals:   02/24/17 2321 02/25/17 0013 02/25/17 0346 02/25/17 0719  BP: (!) 126/93  (!) 130/91 113/72  Pulse: 64 (!) 57 (!) 57 64  Resp: (!) 30 15 14  (!) 21  Temp: (!) 97.4 F (36.3 C)  (!) 97.3 F (36.3 C) (!)  97.4 F (36.3 C)  TempSrc: Axillary  Axillary Axillary  SpO2: 96% 95% 95% 94%  Weight:      Height:         Intake/Output Summary (Last 24 hours) at 02/25/17 0749 Last data filed at 02/25/17 0347  Gross per 24 hour  Intake          1158.33 ml  Output             3350 ml  Net         -2191.67 ml   Filed Weights   02/16/17 1407  Weight: 57.1 kg (125 lb 12.8 oz)    Exam:Unchanged from 02/24/17 except as noted below and red General exam: Emaciated appearing, chronically ill male who is awake with eyes closed and resting quietly, opens eyes to voice and tracks. Respiratory system: Lungs are diminished in the bases but otherwise clear. Cardiovascular system: Heart sounds are regular with an S1, S2. No murmurs, rubs, or gallops. Gastrointestinal system: Abdomen is soft, nontender, nondistended with normal active bowel sounds. Central nervous system: Asleep but awakens to voice. Nonresponsive verbally. Tracking today. Right-sided hemiparesis. Extremities: No clubbing, edema, or cyanosis. Heel protectors in place. Skin: No rashes. Multiple tattoos. Scleral erythema persists. Psychiatry: Mood and affect flat. Unable to assess insight or judgment.  Data Reviewed:   I have personally reviewed following labs and imaging studies:  Labs: Labs show the following:   Basic Metabolic Panel:  Recent Labs Lab 02/19/17 0335 02/22/17 0711 02/23/17 0330 02/24/17 0333  NA 137 137 134* 132*  K 3.6 3.3* 3.4* 3.7  CL 111 111 108 106  CO2 20* 21* 20* 21*  GLUCOSE 86 86 82 82  BUN 15 13 13 11   CREATININE 0.53* 0.51* 0.56* 0.58*  CALCIUM 8.4* 8.0* 8.0* 8.4*   GFR Estimated Creatinine Clearance: 108.1 mL/min (A) (by C-G formula based on SCr of 0.58 mg/dL (L)). Liver Function Tests: No results for input(s): AST, ALT, ALKPHOS, BILITOT, PROT, ALBUMIN in the last 168 hours. No results for input(s): LIPASE, AMYLASE in the last 168 hours. No results for input(s): AMMONIA in the last 168 hours. Coagulation profile No results for input(s): INR, PROTIME in the last 168 hours.  CBC:  Recent Labs Lab  02/21/17 0312 02/22/17 0711 02/23/17 0330 02/24/17 0333 02/25/17 0306  WBC 2.3* 2.4* 3.2* 1.9* 2.3*  NEUTROABS 1.2* 1.4* 2.7 1.2* 1.1*  HGB 10.5* 10.5* 10.8* 11.5* 9.9*  HCT 32.6* 32.2* 33.1* 34.8* 30.7*  MCV 84.9 86.8 85.8 85.1 85.8  PLT 115* 134* 136* 136* 129*   CBG: No results for input(s): GLUCAP in the last 168 hours. Anemia work up:  Recent Labs  02/24/17 0333 02/25/17 0306  RETICCTPCT 0.5 0.5   Sepsis Labs:  Recent Labs Lab 02/20/17 0359  02/22/17 0711 02/23/17 0330 02/24/17 0333 02/25/17 0306  PROCALCITON <0.10  --   --   --   --   --   WBC 3.1*  < > 2.4* 3.2* 1.9* 2.3*  < > = values in this interval not displayed.  Microbiology Recent Results (from the past 240 hour(s))  Culture, blood (Routine x 2)     Status: None   Collection Time: 02/15/17 12:45 PM  Result Value Ref Range Status   Specimen Description BLOOD LEFT ANTECUBITAL  Final   Special Requests   Final    BOTTLES DRAWN AEROBIC ONLY Blood Culture adequate volume   Culture NO GROWTH 5 DAYS  Final   Report Status 02/20/2017 FINAL  Final  Culture, blood (Routine x 2)     Status: None   Collection Time: 02/15/17 12:51 PM  Result Value Ref Range Status   Specimen Description BLOOD RIGHT ANTECUBITAL  Final   Special Requests IN PEDIATRIC BOTTLE Blood Culture adequate volume  Final   Culture NO GROWTH 5 DAYS  Final   Report Status 02/20/2017 FINAL  Final  CSF culture     Status: None   Collection Time: 02/16/17  1:29 PM  Result Value Ref Range Status   Specimen Description CSF  Final   Special Requests NONE  Final   Gram Stain   Final    WBC PRESENT, PREDOMINANTLY PMN YEAST CYTOSPIN SMEAR CRITICAL RESULT CALLED TO, READ BACK BY AND VERIFIED WITHCandie Chroman RN AT [id number] ON [id number] BY SJW    Culture NO GROWTH 3 DAYS  Final   Report Status 02/19/2017 FINAL  Final  Fungus Culture With Stain     Status: None (Preliminary result)   Collection Time: 02/16/17  1:29 PM  Result Value Ref Range Status     Fungus Stain Final report  Final    Comment: (NOTE) Performed At: [facility] [place], [place] [id number] Lindon Romp MD IW:[id number]    Fungus (Mycology) Culture PENDING  Incomplete   Fungal Source CSF  Final  Fungus Culture Result     Status: None   Collection Time: 02/16/17  1:29 PM  Result Value Ref Range Status   Result 1 Comment  Final    Comment: (NOTE) KOH/Calcofluor preparation:  no fungus observed. Performed At: [facility] [place], [place] [id number] Lindon Romp MD JE:[id number]   MRSA PCR Screening     Status: None   Collection Time: 02/16/17  2:44 PM  Result Value Ref Range Status   MRSA by PCR NEGATIVE NEGATIVE Final    Comment:        The GeneXpert MRSA Assay (FDA approved for NASAL specimens only), is one component of a comprehensive MRSA colonization surveillance program. It is not intended to diagnose MRSA infection nor to guide or monitor treatment for MRSA infections.   CSF culture     Status: None   Collection Time: 02/18/17  2:00 PM  Result Value Ref Range Status   Specimen Description CSF  Final   Special Requests TUBE 2  Final   Gram Stain   Final    WBC PRESENT,BOTH PMN AND MONONUCLEAR YEAST CYTOSPIN SMEAR CRITICAL RESULT CALLED TO, READ BACK BY AND VERIFIED WITH: A. RODRIGUEZ RN, AT 3785 02/18/17 BY D. VANHOOK    Culture NO GROWTH 3 DAYS  Final   Report Status 02/21/2017 FINAL  Final  Culture, fungus without smear     Status: None (Preliminary result)   Collection Time: 02/18/17  2:00 PM  Result Value Ref Range Status   Specimen Description CSF  Final   Special Requests TUBE 2  Final   Culture NO FUNGUS ISOLATED AFTER 5 DAYS  Final   Report Status PENDING  Incomplete  CSF culture     Status: None (Preliminary result)   Collection Time: 02/22/17 11:43 AM  Result Value Ref Range Status   Specimen Description CSF  Final   Special Requests TUBE 2  Final   Gram Stain   Final     CYTOSPIN SMEAR WBC PRESENT, PREDOMINANTLY MONONUCLEAR YEAST CORRECTED RESULTS PREVIOUSLY REPORTED AS: NO ORGANISMS SEEN CORRECTED RESULTS CALLED TO: RN Gilman Buttner [id number]  Culture NO GROWTH 2 DAYS  Final   Report Status PENDING  Incomplete    Procedures and diagnostic studies:  No results found.  Medications:   . amantadine  100 mg Oral BID WC  . azithromycin  1,200 mg Oral Weekly  . feeding supplement (ENSURE ENLIVE)  237 mL Oral BID BM  . feeding supplement (PRO-STAT SUGAR FREE 64)  30 mL Oral BID  . multivitamin with minerals  1 tablet Oral Daily  . nystatin  5 mL Oral QID  . sulfamethoxazole-trimethoprim  1 tablet Oral Daily  . tamsulosin  0.4 mg Oral Daily   Continuous Infusions: . 0.9 % NaCl with KCl 40 mEq / L 50 mL/hr (02/25/17 0021)  . fluconazole (DIFLUCAN) IV Stopped (02/24/17 1751)    Medical decision making is of high complexity and this patient is at high risk of deterioration, therefore this is a level 3 visit.  (> 4 problem points, 1 data point, high risk: Need 2 out of 3)   Problems/DDx Points   Self limited or minor (max 2)       1   Established problem, stable       1  4+  Established problem, worsening       2   New problem, no additional W/U planned (max 1)       3   New problem, additional W/U planned        4    Data Reviewed Points   Review/order clinical lab tests       1  1  Review/order x-rays       1   Review/order tests (Echo, EKG, PFTs, etc)       1   Discussion of test results w/ performing MD       1   Independent review of image, tracing or specimen       2   Decision to obtain old records       1   Review and summation of old records       2    Level of risk Presenting prob Diagnostics Management   Minimal 1 self limited/minor Labs CXR EKG/EEG U/A U/S Rest Gargles Bandages Dressings   Low 2 or more self limited/minor 1 stable chronic Acute uncomplicated illness Tests (PFTS) Non-CV imaging Arterial  labs Biopsies of skin OTC drugs Minor surgery-no risk PT OT IVF without additives    Moderate 1 or more chronic illnesses w/ mild exac, progression or S/E from tx 2 or more stable chronic illnesses Undiagnosed new problem w/ uncertain prognosis Acute complicated injury  Stress tests Endoscopies with no risk factors Deep needle or incisional bx CV imaging without risk LP Thoracentesis Paracentesis Minor surgery w/ risks Elective major surgery w/ no risk (open, percutaneous or endoscopic) Prescription drugs Therapeutic nucl med IVF with additives Closed tx of fracture/dislocation    High Severe exac of chronic illness Acute or chronic illness/injury may pose a threat to life or bodily function (ARF) Change in neuro status    CV imaging w/ contrast and risk Cardio electophysiologic tests Endoscopies w/ risk Discography Elective major surgery Emergency major surgery Parenteral controlled substances Drug therapy req monitoring for toxicity DNR/de-escalation of care    MDM Prob points Data points Risk   Straightforward    <1    <1    Min   Low complexity    2    2    Low   Moderate    3  3    Mod   High Complexity    4 or more    4 or more    High      LOS: 10 days   Crispin Vogel  [facility] Pager [phone]. If unable to reach me by pager, please call my cell phone at [phone].  *Please refer to [email or web address], password [email or web address] to get updated schedule on who will round on this patient, as hospitalists switch teams weekly. If 7PM-7AM, please contact night-coverage at [email or web address], password [email or web address] for any overnight needs.  02/25/2017, 7:49 AM

## 2017-02-26 LAB — CBC WITH DIFFERENTIAL/PLATELET
Basophils Absolute: 0 10*3/uL (ref 0.0–0.1)
Basophils Relative: 0 %
EOS ABS: 0 10*3/uL (ref 0.0–0.7)
EOS PCT: 2 %
HCT: 31.5 % — ABNORMAL LOW (ref 39.0–52.0)
HEMOGLOBIN: 10.2 g/dL — AB (ref 13.0–17.0)
LYMPHS ABS: 0.8 10*3/uL (ref 0.7–4.0)
Lymphocytes Relative: 32 %
MCH: 28.1 pg (ref 26.0–34.0)
MCHC: 32.4 g/dL (ref 30.0–36.0)
MCV: 86.8 fL (ref 78.0–100.0)
MONO ABS: 0.3 10*3/uL (ref 0.1–1.0)
MONOS PCT: 11 %
Neutro Abs: 1.3 10*3/uL — ABNORMAL LOW (ref 1.7–7.7)
Neutrophils Relative %: 55 %
PLATELETS: 135 10*3/uL — AB (ref 150–400)
RBC: 3.63 MIL/uL — ABNORMAL LOW (ref 4.22–5.81)
RDW: 15.7 % — AB (ref 11.5–15.5)
WBC: 2.3 10*3/uL — ABNORMAL LOW (ref 4.0–10.5)

## 2017-02-26 LAB — BASIC METABOLIC PANEL
Anion gap: 5 (ref 5–15)
BUN: 12 mg/dL (ref 6–20)
CALCIUM: 8.5 mg/dL — AB (ref 8.9–10.3)
CHLORIDE: 107 mmol/L (ref 101–111)
CO2: 21 mmol/L — ABNORMAL LOW (ref 22–32)
CREATININE: 0.53 mg/dL — AB (ref 0.61–1.24)
GFR calc Af Amer: >60 mL/min (ref >60)
GFR calc non Af Amer: >60 mL/min (ref >60)
Glucose, Bld: 85 mg/dL (ref 65–99)
Potassium: 4 mmol/L (ref 3.5–5.1)
SODIUM: 133 mmol/L — AB (ref 135–145)

## 2017-02-26 NOTE — Progress Notes (Signed)
Progress Note    William Pena  PJK:[id number] DOB: April 14, 1986  DOA: 02/15/2017 PCP: [name], MD    Brief Narrative:   Chief complaint: Follow-up encephalopathy  Medical records reviewed and are as summarized below:  William Pena is an 31 y.o. 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 Initial impression from ID was that he had relapsed cryptococcal meningitis.  Assessment/Plan:   Principal Problem:   Sepsis ([name] to cryptococcal meningeal encephalitis Being treated for relapsed cryptococcal meningitis with fluconazole. Will need 8 weeks of therapy per ID (started 02/16/17 with stopping date 04/13/17), and will likely need 200 mg of fluconazole as oral therapy for another 10 months to equal 12 months of treatment. Vital signs currently stable with resolution of sepsis. No change in overall condition.  Active Problems:   AIDS (acquired immune deficiency syndrome) (HCC)/HIV infection Unable to start antiretroviral therapy at this time due to the risk of immune reconstitution inflammatory syndrome per ID. Palliative care consulted with goals of care meeting held 02/22/17, with full scope of care recommended due to differing opinions from family regarding how to proceed. Guardianship hearing scheduled for next week. Lengthy discussion held with palliative care regarding treatment planning for this patient 02/23/17. Team will meet with family again next week to discuss goals of care again, but for now he remains a full code with full scope of treatment. Continue Bactrim daily and azithromycin weekly to prevent opportunistic infections.Prognosis remains poor.    Encephalopathy acute Appears to be related to cryptococcal meningeal encephalitis. No change in  condition today. Remains non-verbal.    AKI (acute kidney injury) ([place]) Resolved.    Protein-calorie malnutrition, severe Continue supplements. Body mass index is 20.3 kg/m. Eats with the help of nursing staff.    Anemia of chronic disease/thrombocytopenia/Leukopenia Hematology following. 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 Transfuse when necessary per recommendations. Continue to monitor blood counts closely. Blood counts remain suppressed but stable today.    3 full thickness wounds to bilat buttocks; 2X.5X.5cm, 1X1X1cm, 5.5X3X.2cm Continue wound care per WOC. Per her notes: "Pt had I&D of buttock abscesses in May with the surgical team, according to the EMR.  These wounds were present on admission but are NOT pressure injuries."    Deep tissue injuries bilateral heels Wound care per wound care team. Floating heels.    Hypokalemia Potassium has been added to IV fluids, and has normalized.    Hyponatremia Mild, monitor.  Family Communication/Anticipated D/C date and plan/Code Status   DVT prophylaxis: SCDs ordered. Code Status: Full Code.  Family Communication: No family presently at the bedside. Disposition Plan: Will need a skilled level of care once goals of care established unless family opts for comfort, in which case residential hospice would be appropriate.   Medical Consultants:    Infectious Disease  Palliative Care  Neurology  Oncology   Anti-Infectives:    Fluconazole 02/16/17--->  Septra resumed 02/22/17  Azithromycin resumed 02/22/17  Subjective:   Patient is nonverbal.  Objective:    Vitals:   02/25/17 2352 02/26/17 0434 02/26/17 0500 02/26/17 0600  BP: 120/76 123/83 119/86 126/83  Pulse: 74 64 (!) 54 (!) 50  Resp: 15 14 15 16   Temp: 98.3 F (36.8 C) 97.8 F (36.6 C)    TempSrc: Oral Oral    SpO2:  95% 95% 95% 95%  Weight:      Height:        Intake/Output Summary (Last 24 hours) at 02/26/17 0731 Last data filed at 02/26/17  0437  Gross per 24 hour  Intake          1608.33 ml  Output             2000 ml  Net          -391.67 ml   Filed Weights   02/16/17 1407  Weight: 57.1 kg (125 lb 12.8 oz)    Exam: General: No acute distress. Awake, eyes open, staring off into space. Cardiovascular: Heart sounds show a regular rate, and rhythm. No gallops or rubs. No murmurs. No JVD. Lungs: Clear to auscultation bilaterally with good air movement. No rales, rhonchi or wheezes. Abdomen: Soft, nontender, nondistended with normal active bowel sounds. No masses. No hepatosplenomegaly. Neurological: Awake but nonverbal.  Skin: Warm and dry. No rashes or lesions. Multiple tattoos. Extremities: No clubbing or cyanosis. No edema. Pedal pulses 2+. Heel protectors in place. Psychiatric: Unable to assess.   Data Reviewed:   I have personally reviewed following labs and imaging studies:  Labs: Labs show the following:   Basic Metabolic Panel:  Recent Labs Lab 02/22/17 0711 02/23/17 0330 02/24/17 0333 02/26/17 0300  NA 137 134* 132* 133*  K 3.3* 3.4* 3.7 4.0  CL 111 108 106 107  CO2 21* 20* 21* 21*  GLUCOSE 86 82 82 85  BUN 13 13 11 12   CREATININE 0.51* 0.56* 0.58* 0.53*  CALCIUM 8.0* 8.0* 8.4* 8.5*   GFR Estimated Creatinine Clearance: 108.1 mL/min (A) (by C-G formula based on SCr of 0.53 mg/dL (L)). Liver Function Tests: No results for input(s): AST, ALT, ALKPHOS, BILITOT, PROT, ALBUMIN in the last 168 hours. No results for input(s): LIPASE, AMYLASE in the last 168 hours. No results for input(s): AMMONIA in the last 168 hours. Coagulation profile No results for input(s): INR, PROTIME in the last 168 hours.  CBC:  Recent Labs Lab 02/22/17 0711 02/23/17 0330 02/24/17 0333 02/25/17 0306 02/26/17 0300  WBC 2.4* 3.2* 1.9* 2.3* 2.3*  NEUTROABS 1.4* 2.7 1.2* 1.1* 1.3*  HGB 10.5* 10.8* 11.5* 9.9* 10.2*  HCT 32.2* 33.1* 34.8* 30.7* 31.5*  MCV 86.8 85.8 85.1 85.8 86.8  PLT 134* 136* 136* 129* 135*    CBG: No results for input(s): GLUCAP in the last 168 hours. Anemia work up:  Recent Labs  02/24/17 0333 02/25/17 0306  RETICCTPCT 0.5 0.5   Sepsis Labs:  Recent Labs Lab 02/20/17 0359  02/23/17 0330 02/24/17 0333 02/25/17 0306 02/26/17 0300  PROCALCITON <0.10  --   --   --   --   --   WBC 3.1*  < > 3.2* 1.9* 2.3* 2.3*  < > = values in this interval not displayed.  Microbiology Recent Results (from the past 240 hour(s))  CSF culture     Status: None   Collection Time: 02/16/17  1:29 PM  Result Value Ref Range Status   Specimen Description CSF  Final   Special Requests NONE  Final   Gram Stain   Final    WBC PRESENT, PREDOMINANTLY PMN YEAST CYTOSPIN SMEAR CRITICAL RESULT CALLED TO, READ BACK BY AND VERIFIED WITHCandie Chroman RN AT 1433 ON [id number] BY SJW    Culture NO GROWTH 3 DAYS  Final   Report Status 02/19/2017 FINAL  Final  Fungus Culture With Stain     Status:  None (Preliminary result)   Collection Time: 02/16/17  1:29 PM  Result Value Ref Range Status   Fungus Stain Final report  Final    Comment: (NOTE) Performed At: [facility] [place], [place] [id number] Lindon Romp MD GH:[id number]    Fungus (Mycology) Culture PENDING  Incomplete   Fungal Source CSF  Final  Fungus Culture Result     Status: None   Collection Time: 02/16/17  1:29 PM  Result Value Ref Range Status   Result 1 Comment  Final    Comment: (NOTE) KOH/Calcofluor preparation:  no fungus observed. Performed At: [facility] [place], [place] [id number] Lindon Romp MD PZ:[id number]   MRSA PCR Screening     Status: None   Collection Time: 02/16/17  2:44 PM  Result Value Ref Range Status   MRSA by PCR NEGATIVE NEGATIVE Final    Comment:        The GeneXpert MRSA Assay (FDA approved for NASAL specimens only), is one component of a comprehensive MRSA colonization surveillance program. It is not intended to diagnose MRSA infection  nor to guide or monitor treatment for MRSA infections.   CSF culture     Status: None   Collection Time: 02/18/17  2:00 PM  Result Value Ref Range Status   Specimen Description CSF  Final   Special Requests TUBE 2  Final   Gram Stain   Final    WBC PRESENT,BOTH PMN AND MONONUCLEAR YEAST CYTOSPIN SMEAR CRITICAL RESULT CALLED TO, READ BACK BY AND VERIFIED WITH: A. RODRIGUEZ RN, AT 1533 02/18/17 BY D. VANHOOK    Culture NO GROWTH 3 DAYS  Final   Report Status 02/21/2017 FINAL  Final  Culture, fungus without smear     Status: None (Preliminary result)   Collection Time: 02/18/17  2:00 PM  Result Value Ref Range Status   Specimen Description CSF  Final   Special Requests TUBE 2  Final   Culture NO FUNGUS ISOLATED AFTER 7 DAYS  Final   Report Status PENDING  Incomplete  CSF culture     Status: None   Collection Time: 02/22/17 11:43 AM  Result Value Ref Range Status   Specimen Description CSF  Final   Special Requests TUBE 2  Final   Gram Stain   Final    CYTOSPIN SMEAR WBC PRESENT, PREDOMINANTLY MONONUCLEAR YEAST CORRECTED RESULTS PREVIOUSLY REPORTED AS: NO ORGANISMS SEEN CORRECTED RESULTS CALLED TO: RN Gilman Buttner [id number] MLM    Culture NO GROWTH 3 DAYS  Final   Report Status 02/25/2017 FINAL  Final    Procedures and diagnostic studies:  No results found.  Medications:   . amantadine  100 mg Oral BID WC  . azithromycin  1,200 mg Oral Weekly  . feeding supplement (ENSURE ENLIVE)  237 mL Oral BID BM  . feeding supplement (PRO-STAT SUGAR FREE 64)  30 mL Oral BID  . multivitamin with minerals  1 tablet Oral Daily  . nystatin  5 mL Oral QID  . polyvinyl alcohol  1 drop Both Eyes QID  . sulfamethoxazole-trimethoprim  1 tablet Oral Daily  . tamsulosin  0.4 mg Oral Daily   Continuous Infusions: . 0.9 % NaCl with KCl 40 mEq / L 50 mL/hr (02/25/17 1952)  . fluconazole (DIFLUCAN) IV Stopped (02/25/17 1453)    Medical decision making is of high complexity and this  patient is at high risk of deterioration, therefore this is a level 3 visit.  (>  4 problem points, 1 data point, high risk: Need 2 out of 3)   Problems/DDx Points   Self limited or minor (max 2)       1   Established problem, stable       1  4+  Established problem, worsening       2   New problem, no additional W/U planned (max 1)       3   New problem, additional W/U planned        4    Data Reviewed Points   Review/order clinical lab tests       1  1  Review/order x-rays       1   Review/order tests (Echo, EKG, PFTs, etc)       1   Discussion of test results w/ performing MD       1   Independent review of image, tracing or specimen       2   Decision to obtain old records       1   Review and summation of old records       2    Level of risk Presenting prob Diagnostics Management   Minimal 1 self limited/minor Labs CXR EKG/EEG U/A U/S Rest Gargles Bandages Dressings   Low 2 or more self limited/minor 1 stable chronic Acute uncomplicated illness Tests (PFTS) Non-CV imaging Arterial labs Biopsies of skin OTC drugs Minor surgery-no risk PT OT IVF without additives    Moderate 1 or more chronic illnesses w/ mild exac, progression or S/E from tx 2 or more stable chronic illnesses Undiagnosed new problem w/ uncertain prognosis Acute complicated injury  Stress tests Endoscopies with no risk factors Deep needle or incisional bx CV imaging without risk LP Thoracentesis Paracentesis Minor surgery w/ risks Elective major surgery w/ no risk (open, percutaneous or endoscopic) Prescription drugs Therapeutic nucl med IVF with additives Closed tx of fracture/dislocation    High Severe exac of chronic illness Acute or chronic illness/injury may pose a threat to life or bodily function (ARF) Change in neuro status    CV imaging w/ contrast and risk Cardio electophysiologic tests Endoscopies w/ risk Discography Elective major surgery Emergency major  surgery Parenteral controlled substances Drug therapy req monitoring for toxicity DNR/de-escalation of care    MDM Prob points Data points Risk   Straightforward    <1    <1    Min   Low complexity    2    2    Low   Moderate    3    3    Mod   High Complexity    4 or more    4 or more    High      LOS: 11 days   RAMA,CHRISTINA  [facility] Pager [phone]. If unable to reach me by pager, please call my cell phone at [phone].  *Please refer to [email or web address], password [email or web address] to get updated schedule on who will round on this patient, as hospitalists switch teams weekly. If 7PM-7AM, please contact night-coverage at [email or web address], password [email or web address] for any overnight needs.  02/26/2017, 7:31 AM

## 2017-02-27 NOTE — Progress Notes (Signed)
CSW continuing to follow and assist with disposition as needed- patient able to return to [facility] if appropriate for that level of care  Jorge Ny, [place] Social Worker [phone]

## 2017-02-27 NOTE — Progress Notes (Signed)
Daily Progress Note   Patient Name: William Pena       Date: 02/27/2017 DOB: 01/16/1986  Age: 31 y.o. MRN#: [id number] Attending Physician: William Royalty, MD Primary Care Physician: William Quant, MD Admit Date: 02/15/2017  Reason for Consultation/Follow-up: Establishing goals of care, Interfamily conflict and Psychosocial/spiritual support  Subjective: William Pena is unchanged from when I last saw him on Friday. [name] awake with his eyes open. He will track me with his eyes. No signs of distress or discomfort. He has continues to accept medication orally, and does continue to eat and drink.   Length of Stay: 12  Current Medications: Scheduled Meds:  . amantadine  100 mg Oral BID WC  . azithromycin  1,200 mg Oral Weekly  . feeding supplement (ENSURE ENLIVE)  237 mL Oral BID BM  . feeding supplement (PRO-STAT SUGAR FREE 64)  30 mL Oral BID  . multivitamin with minerals  1 tablet Oral Daily  . nystatin  5 mL Oral QID  . polyvinyl alcohol  1 drop Both Eyes QID  . sulfamethoxazole-trimethoprim  1 tablet Oral Daily  . tamsulosin  0.4 mg Oral Daily    Continuous Infusions: . 0.9 % NaCl with KCl 40 mEq / L 50 mL/hr (02/26/17 1800)  . fluconazole (DIFLUCAN) IV Stopped (02/26/17 1323)    PRN Meds: acetaminophen **OR** acetaminophen, HYDROcodone-acetaminophen, polyethylene glycol, polyvinyl alcohol  Physical Exam   Constitution: Frail man lying in bed. HENT:  Head: Normocephalic and atraumatic.  Mucous membranes moist. Nose: Nose normal.  Eyes:  Right pupil slightly larger than left. Will track me. Neck: Normal range of motion. Neck supple.  Pulmonary/Chest: Effort normal. Breathing comfortably in bed, no signs of distress.  Musculoskeletal:  UTA, arms contracted. Right fingers with rare spastic/jerks, no movement seen on RLE. LUE  with occasional purposeful movement, no movement observed on LLE. Significant muscle wasting.  Neurological:  Pt is awake. His eyes are open and he will track me, but seems to be staring through me. Not speaking or following commands. Not blinking on command consistently. Skin: Skin is warm and dry.  Extensive tatoos  Psychiatric:  Calm in bed, otherwise non-interactive         Vital Signs: BP 107/71   Pulse (!) 55   Temp 98 F (36.7 C) (Oral)   Resp 11   Ht _0  (1.676 m)   Wt 57.1 kg (125 lb 12.8 oz)   SpO2 98%   BMI 20.30 kg/m  SpO2: SpO2: 98 % O2 Device: O2 Device: Not Delivered O2 Flow Rate: O2 Flow Rate (L/min): 2 L/min  Intake/output summary:   Intake/Output Summary (Last 24 hours) at 02/27/17 1110 Last data filed at 02/27/17 0500  Gross per 24 hour  Intake          3194.17 ml  Output              525 ml  Net          2669.17 ml   LBM: Last BM Date: 02/24/17 Baseline Weight: Weight: 57.1 kg (125 lb 12.8 oz) Most recent weight: Weight: 57.1 kg (125 lb 12.8 oz)  Palliative Assessment/Data: PPS 30%   Flowsheet Rows  Most Recent Value  Intake Tab  Referral Department  Hospitalist  Unit at Time of Referral  ICU  Palliative Care Primary Diagnosis  Sepsis/Infectious Disease  Date Notified  02/21/17  Palliative Care Type  New Palliative care  Reason for referral  Clarify Goals of Care  Date of Admission  02/15/17  # of days IP prior to Palliative referral  6  Clinical Assessment  Psychosocial & Spiritual Assessment  Palliative Care Outcomes      Patient Active Problem List   Diagnosis Date Noted  . Hypokalemia 02/23/2017  . Encephalopathy   . Leukopenia 02/22/2017  . Deep tissue injury bilateral heels 02/22/2017  . Wound of buttock 02/22/2017  . Goals of care, counseling/discussion   . Palliative care by specialist   . Sepsis ([place]) 02/16/2017  . Acute encephalopathy 02/15/2017  . Acute urinary retention 02/08/2017  . Genital herpes 02/08/2017  .  Anemia of chronic disease 02/08/2017  . Constipation 02/08/2017  . Protein-calorie malnutrition, severe 01/28/2017  . Pressure injury of skin 01/16/2017  . AKI (acute kidney injury) ([place])   . HIV (human immunodeficiency virus infection) ([place])   . Meningitis   . 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 Acute blood loss anemia   . Elevated intracranial pressure   . HIV disease ([place])   . Lymphadenopathy of head and neck   . Rectal abscess   . 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 Cavitary pneumonia   . Diffuse lymphadenopathy   . Drug rash   . Hypomagnesemia 12/23/2016  . Encephalopathy acute 12/23/2016  . SIRS (systemic inflammatory response syndrome) ([place]) 12/23/2016  . Acute respiratory failure ([place]) 12/23/2016  . Pulmonary infiltrates   . Perirectal abscess 12/21/2016  . Hyponatremia 12/21/2016  . Normocytic anemia 12/21/2016  . Elevated LFTs 12/21/2016  . AIDS (acquired immune deficiency syndrome) ([place]) 07/07/2016  . Chronic pain 06/20/2011    Palliative Care Assessment & Plan   HPI: 31 y.o. 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 He was recently here 8/58-8/5 with a complicated course due to disseminated cryptococcal infection and cryptococcal meningoencephalitis. He now presents from SNF with worsening encephalopathy and decreased oral intake. He was admitted on 02/15/2017. On admission he was was found to be septic in the setting of recurrent cryptococcal meningitis; ID following. He also had increased ICP for which he is having serial LPs to monitor (VP shunt to be considered if ICPs climb again); Neurology consulted and advised. 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 Palliative consulted to assist family in clarifying goals of care given the patient's recurrent health issues and now significant failure to thrive.   Assessment: I had a long conversation with both the patient's mother and father 7/25 exploring their perceptions of [name]'s wishes regarding goals of care and advanced directives. In accordance with the clarification provided by the patient's Guardian ad Litem Gl[place]ora Score [phone]), the statutory medical decision makers are his parents. Unfortunately, his parents have significantly different opinions on what Hawkins would want and how he is doing. His mother relates that Tameron has been talking about death and dying, and that he would not want to live in this state of prolonged debility. She also feels he is suffering by having his life  prolonged with aggressive medical interventions. While she has not yet been able to verbalize a desire for DNR status or withdrawal of aggressive interventions, she is clearly in a contemplative stage and open to considering a more comfort focused approach. Syaire's father, on the other hand, feels that Harce "is a Nurse, adult" who would want to live no matter what his life looks like. Ruvim had not discussed death or dying with him, and Broadus John understood his sons wishes to be full scope aggressive care indefinitely.   I checked back in with both [name] and Broadus John today. [name] is exhausted and overwhelmed. She is not seeing any significant changes in [place], and continues to express concern that he is suffering by being in this prolonged state of debility and unable to communicate. Joseph's mother was here this weekend and spoke with Broadus John over the phone. Per him, he understood that Jamall was "doing well." I explored what he meant by that, which he clarified was that Jossue was awake, looking at his grandmother, and apparently blinking on command to questions. I expressed by surprise  about this given my own inability to elicit any consistent response from him (also no documented responsiveness to questions/commands in chart). He felt this was due to his "close relationship with his grandmother." We talked through a few of his medical questions, including plan for ICP evaluation and timeline for restarting his HIV medication.   Guardianship hearing tomorrow at 2:30pm per mother.   Recommendations/Plan:  Full code, full scope care  Difficult situation as mother and father have very different perception's of their son's wishes and current health state. Guardianship hearing on 7/31 at 1430   Goals of Care and Additional Recommendations:  Limitations on Scope of Treatment: Full Scope Treatment  Code Status:  Full code  Prognosis:   Unable to determine  Discharge Planning:  To Be Determined  Care plan was discussed with pt's mother and father.  Thank you for allowing the Palliative Medicine Team to assist in the care of this patient.  Total time: 35 minutes    Greater than 50%  of this time was spent counseling and coordinating care related to the above assessment and plan.  Charlynn Court, NP Palliative Medicine Team [phone] pager (7a-5p) Team Phone # [phone]

## 2017-02-27 NOTE — Progress Notes (Signed)
    [facility] for Infectious Disease   Reason for visit: Follow up on Cryptococal meningitis  Interval History: minimal response; mother going to court tomorrow for guardianship hearing; all cultures have remained negative.    Physical Exam: Constitutional:  Vitals:   02/27/17 0600 02/27/17 0714  BP: 119/83 107/71  Pulse: 61 (!) 55  Resp: 13 11  Temp:  98 F (36.7 C)   patient appears in NAD HENT: eyes not open Respiratory: Normal respiratory effort; CTA B Cardiovascular: RRR GI: soft, nt, nd  Review of Systems: Unable to be assessed due to mental status  Lab Results  Component Value Date   WBC 2.3 (L) 02/26/2017   HGB 10.2 (L) 02/26/2017   HCT 31.5 (L) 02/26/2017   MCV 86.8 02/26/2017   PLT 135 (L) 02/26/2017    Lab Results  Component Value Date   CREATININE 0.53 (L) 02/26/2017   BUN 12 02/26/2017   NA 133 (L) 02/26/2017   K 4.0 02/26/2017   CL 107 02/26/2017   CO2 21 (L) 02/26/2017    Lab Results  Component Value Date   ALT 31 02/16/2017   AST 35 02/16/2017   ALKPHOS 94 02/16/2017     Microbiology: Recent Results (from the past 240 hour(s))  CSF culture     Status: None   Collection Time: 02/18/17  2:00 PM  Result Value Ref Range Status   Specimen Description CSF  Final   Special Requests TUBE 2  Final   Gram Stain   Final    WBC PRESENT,BOTH PMN AND MONONUCLEAR YEAST CYTOSPIN SMEAR CRITICAL RESULT CALLED TO, READ BACK BY AND VERIFIED WITH: A. RODRIGUEZ RN, AT 1533 02/18/17 BY D. VANHOOK    Culture NO GROWTH 3 DAYS  Final   Report Status 02/21/2017 FINAL  Final  Culture, fungus without smear     Status: None (Preliminary result)   Collection Time: 02/18/17  2:00 PM  Result Value Ref Range Status   Specimen Description CSF  Final   Special Requests TUBE 2  Final   Culture NO FUNGUS ISOLATED AFTER 7 DAYS  Final   Report Status PENDING  Incomplete  CSF culture     Status: None   Collection Time: 02/22/17 11:43 AM  Result Value Ref Range  Status   Specimen Description CSF  Final   Special Requests TUBE 2  Final   Gram Stain   Final    CYTOSPIN SMEAR WBC PRESENT, PREDOMINANTLY MONONUCLEAR YEAST CORRECTED RESULTS PREVIOUSLY REPORTED AS: NO ORGANISMS SEEN CORRECTED RESULTS CALLED TO: RN Gilman Buttner [id number] MLM    Culture NO GROWTH 3 DAYS  Final   Report Status 02/25/2017 FINAL  Final    Impression/Plan:  1. Cryptococcal meningitis - repeat LP this week, Wed or Thursday.   2.  HIV/AIDS - holding ARVs due to #1 and will see if ICP is ok this week and next week, consider starting; 3. Disposition - I agree with continued efforts with palliative care and need to focus on comfort.  Prognosis very poor.

## 2017-02-27 NOTE — Progress Notes (Signed)
Progress Note    William Pena  LKG:[id number] DOB: 27-Jul-1986  DOA: 02/15/2017 PCP: [name], MD    Brief Narrative:   Chief complaint: Follow-up encephalopathy  Medical records reviewed and are as summarized below:  William Pena is an 31 y.o. 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 Initial impression from ID was that he had relapsed cryptococcal meningitis.  Assessment/Plan:   Principal Problem:   Sepsis ([name] to cryptococcal meningeal encephalitis Being treated for relapsed cryptococcal meningitis with fluconazole. Will need 8 weeks of therapy per ID (started 02/16/17 with stopping date 04/13/17), and will likely need 200 mg of fluconazole as oral therapy for another 10 months to equal 12 months of treatment. Vital signs currently stable with resolution of sepsis. No change in overall condition.  Active Problems:   AIDS (acquired immune deficiency syndrome) (HCC)/HIV infection Unable to start antiretroviral therapy at this time due to the risk of immune reconstitution inflammatory syndrome per ID. Palliative care consulted with goals of care meeting held 02/22/17, with full scope of care recommended due to differing opinions from family regarding how to proceed. Guardianship hearing scheduled for 02/28/17. Lengthy discussion held with palliative care regarding treatment planning for this patient 02/23/17. Team will meet with family again next week to discuss goals of care again, but for now he remains a full code with full scope of treatment. Continue Bactrim daily and azithromycin weekly to prevent opportunistic infections.Prognosis remains poor.    Encephalopathy acute Appears to be related to cryptococcal meningeal encephalitis. No change in  condition today. Remains non-verbal.    AKI (acute kidney injury) ([place]) Resolved.    Protein-calorie malnutrition, severe Continue supplements. Body mass index is 20.3 kg/m. Eats with the help of nursing staff.    Anemia of chronic disease/thrombocytopenia/Leukopenia Hematology following. 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 Transfuse when necessary per recommendations. Continue to monitor blood counts closely. Blood counts not checked today. Check again tomorrow.    3 full thickness wounds to bilat buttocks; 2X.5X.5cm, 1X1X1cm, 5.5X3X.2cm Continue wound care per WOC. Per her notes: "Pt had I&D of buttock abscesses in May with the surgical team, according to the EMR.  These wounds were present on admission but are NOT pressure injuries."    Deep tissue injuries bilateral heels Wound care per wound care team. Floating heels.    Hypokalemia Potassium has been added to IV fluids, and has normalized.    Hyponatremia Mild, monitor.  Family Communication/Anticipated D/C date and plan/Code Status   DVT prophylaxis: SCDs ordered. Code Status: Full Code.  Family Communication: No family presently at the bedside. Disposition Plan: Will need a skilled level of care once goals of care established unless family opts for comfort, in which case residential hospice would be appropriate.   Medical Consultants:    Infectious Disease  Palliative Care  Neurology  Oncology   Anti-Infectives:    Fluconazole 02/16/17--->  Septra resumed 02/22/17  Azithromycin resumed 02/22/17  Subjective:   Patient is nonverbal.  Objective:    Vitals:   02/27/17 0400 02/27/17 0500 02/27/17 0600 02/27/17 0714  BP: 123/85 (!) 128/91 119/83 107/71  Pulse: 68 66 61 (!) 55  Resp: 13 14 13 11   Temp:    98 F (36.7 C)  TempSrc:    Oral  SpO2: 99% 99% 99%  98%  Weight:      Height:        Intake/Output Summary (Last 24 hours) at 02/27/17 0721 Last data filed at 02/27/17 0500  Gross per  24 hour  Intake          4284.17 ml  Output              525 ml  Net          3759.17 ml   Filed Weights   02/16/17 1407  Weight: 57.1 kg (125 lb 12.8 oz)    Exam: General: No acute distress.Sitting up with eyes closed. Cardiovascular: Heart sounds show a regular rate, and rhythm. No gallops or rubs. No murmurs. No JVD. Lungs: Clear to auscultation bilaterally with good air movement. No rales, rhonchi or wheezes. Abdomen: Soft, nontender, nondistended with normal active bowel sounds. No masses. No hepatosplenomegaly. Neurological: Nonverbal. Does not follow commands. Tracks with eyes. Skin: Warm and dry. No rashes or lesions. Multiple tattoos. Extremities: No clubbing or cyanosis. No edema. Pedal pulses 2+. Heel protectors in place.  Data Reviewed:   I have personally reviewed following labs and imaging studies:  Labs: Labs show the following:   Basic Metabolic Panel:  Recent Labs Lab 02/22/17 0711 02/23/17 0330 02/24/17 0333 02/26/17 0300  NA 137 134* 132* 133*  K 3.3* 3.4* 3.7 4.0  CL 111 108 106 107  CO2 21* 20* 21* 21*  GLUCOSE 86 82 82 85  BUN 13 13 11 12   CREATININE 0.51* 0.56* 0.58* 0.53*  CALCIUM 8.0* 8.0* 8.4* 8.5*   GFR Estimated Creatinine Clearance: 108.1 mL/min (A) (by C-G formula based on SCr of 0.53 mg/dL (L)). Liver Function Tests: No results for input(s): AST, ALT, ALKPHOS, BILITOT, PROT, ALBUMIN in the last 168 hours. No results for input(s): LIPASE, AMYLASE in the last 168 hours. No results for input(s): AMMONIA in the last 168 hours. Coagulation profile No results for input(s): INR, PROTIME in the last 168 hours.  CBC:  Recent Labs Lab 02/22/17 0711 02/23/17 0330 02/24/17 0333 02/25/17 0306 02/26/17 0300  WBC 2.4* 3.2* 1.9* 2.3* 2.3*  NEUTROABS 1.4* 2.7 1.2* 1.1* 1.3*  HGB 10.5* 10.8* 11.5* 9.9* 10.2*  HCT 32.2* 33.1* 34.8* 30.7* 31.5*  MCV 86.8 85.8 85.1 85.8 86.8  PLT 134* 136* 136* 129* 135*   Anemia work up:  Recent Labs   02/25/17 0306  RETICCTPCT 0.5   Sepsis Labs:  Recent Labs Lab 02/23/17 0330 02/24/17 0333 02/25/17 0306 02/26/17 0300  WBC 3.2* 1.9* 2.3* 2.3*    Microbiology Recent Results (from the past 240 hour(s))  CSF culture     Status: None   Collection Time: 02/18/17  2:00 PM  Result Value Ref Range Status   Specimen Description CSF  Final   Special Requests TUBE 2  Final   Gram Stain   Final    WBC PRESENT,BOTH PMN AND MONONUCLEAR YEAST CYTOSPIN SMEAR CRITICAL RESULT CALLED TO, READ BACK BY AND VERIFIED WITH: A. RODRIGUEZ RN, AT 5956 02/18/17 BY D. VANHOOK    Culture NO GROWTH 3 DAYS  Final   Report Status 02/21/2017 FINAL  Final  Culture, fungus without smear     Status: None (Preliminary result)   Collection Time: 02/18/17  2:00 PM  Result Value Ref Range Status   Specimen Description CSF  Final   Special Requests TUBE 2  Final   Culture NO FUNGUS ISOLATED AFTER 7 DAYS  Final   Report Status PENDING  Incomplete  CSF culture     Status: None   Collection Time: 02/22/17 11:43 AM  Result Value Ref Range Status   Specimen Description CSF  Final   Special Requests TUBE 2  Final   Gram Stain   Final    CYTOSPIN SMEAR WBC PRESENT, PREDOMINANTLY MONONUCLEAR YEAST CORRECTED RESULTS PREVIOUSLY REPORTED AS: NO ORGANISMS SEEN CORRECTED RESULTS CALLED TO: RN Gilman Buttner [id number] MLM    Culture NO GROWTH 3 DAYS  Final   Report Status 02/25/2017 FINAL  Final    Procedures and diagnostic studies:  No results found.  Medications:   . amantadine  100 mg Oral BID WC  . azithromycin  1,200 mg Oral Weekly  . feeding supplement (ENSURE ENLIVE)  237 mL Oral BID BM  . feeding supplement (PRO-STAT SUGAR FREE 64)  30 mL Oral BID  . multivitamin with minerals  1 tablet Oral Daily  . nystatin  5 mL Oral QID  . polyvinyl alcohol  1 drop Both Eyes QID  . sulfamethoxazole-trimethoprim  1 tablet Oral Daily  . tamsulosin  0.4 mg Oral Daily   Continuous Infusions: . 0.9 % NaCl with  KCl 40 mEq / L 50 mL/hr (02/26/17 1800)  . fluconazole (DIFLUCAN) IV Stopped (02/26/17 1323)    Patient remains relatively stable and a neurologically compromised state. Level II visit.   LOS: 12 days   [facility] Pager [phone]. If unable to reach me by pager, please call my cell phone at [phone].  *Please refer to [email or web address], password [email or web address] to get updated schedule on who will round on this patient, as hospitalists switch teams weekly. If 7PM-7AM, please contact night-coverage at [email or web address], password [email or web address] for any overnight needs.  02/27/2017, 7:21 AM

## 2017-02-28 NOTE — Progress Notes (Signed)
Nutrition Follow Up  DOCUMENTATION CODES:   Not applicable  INTERVENTION:    Continue Enlive po BID, each supplement provides 350 kcal and 20 grams of protein   Continue Prostat liquid protein po 30 ml BID with meals, each supplement provides 100 kcal, 15 grams protein  NEW NUTRITION DIAGNOSIS:   Malnutrition (severe) related to chronic illness (AIDS) as evidenced by severe depletion of body fat, severe depletion of muscle mass, percent weight loss, ongoing  GOAL:   Patient will meet greater than or equal to 90% of their needs, progressing   MONITOR:   PO intake, Supplement acceptance, Labs, Weight trends, Skin, I & O's  ASSESSMENT:   31 yo Male with PMH of AIDS, recent admission for perirectal abscess, development of cryptococcal meningoencephalitis, recent CVA,  ARF, severe protein calorie malnutrition who presents to the ED from facility with increased mental status change. Patient was in usual state of health. On recent discharge (dated 7/9), patient was recommended by ID to continue fluconazole x 4 more weeks before resuming antiviral meds for HIV. Patient is non-verbal at baseline. Patient's mother reported patient's poor PO intake recently.  Pt s/p lumbar puncture 7/20 per Neurology. PO intake variable at 25-100% per flowsheet records. When pt's Mom is visiting, she feeds him and he eats well.  Drinking some of his Ensure Enlive and taking his Prostat liquid protein. Medications reviewed and include MVI. Labs reviewed. Palliative Care Team continues to follow.  Diet Order:  Diet regular Room service appropriate? Yes; Fluid consistency: Thin  Skin:    3 full thickness wounds to bilat buttocks DTI Left heel DTI Right heel   Last BM:  7/30  Height:   Ht Readings from Last 1 Encounters:  02/17/17 5\' 6"  (1.676 m)   Weight:   Wt Readings from Last 1 Encounters:  02/16/17 125 lb 12.8 oz (57.1 kg)   Wt Readings from Last 10 Encounters:  02/16/17 125 lb 12.8 oz  (57.1 kg)  02/07/17 136 lb (61.7 kg)   Ideal Body Weight:  75.4 kg  BMI:  Body mass index is 20.3 kg/m.  Estimated Nutritional Needs:   Kcal:  1800-2000  Protein:  90-110 gm  Fluid:  1.8-2.0 L  EDUCATION NEEDS:   No education needs identified at this time  Arthur Holms, RD, LDN Pager #: [phone] After-Hours Pager #: [phone]

## 2017-02-28 NOTE — Progress Notes (Signed)
Progress Note    William Pena  NID:[id number] DOB: 1985/08/27  DOA: 02/15/2017 PCP: [name], MD    Brief Narrative:   Chief complaint: Follow-up encephalopathy  Medical records reviewed and are as summarized below:  William Pena is an 31 y.o. 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 Initial impression from ID was that he had relapsed cryptococcal meningitis.  Assessment/Plan:   Principal Problem:   Sepsis ([name] to cryptococcal meningeal encephalitis Being treated for relapsed cryptococcal meningitis with fluconazole. Will need 8 weeks of therapy per ID (started 02/16/17 with stopping date 04/13/17), and will likely need 200 mg of fluconazole as oral therapy for another 10 months to equal 12 months of treatment. Vital signs currently stable with resolution of sepsis. No change in overall condition.  Active Problems:   AIDS (acquired immune deficiency syndrome) (HCC)/HIV infection Unable to start antiretroviral therapy at this time due to the risk of immune reconstitution inflammatory syndrome per ID. Palliative care consulted with goals of care meeting held 02/22/17, with full scope of care recommended due to differing opinions from family regarding how to proceed. Lengthy discussion held with palliative care regarding treatment planning for this patient 02/23/17.  Guardianship hearing held 02/28/17.Team will touch base with guardian later today to discuss goals of care again, but for now he remains a full code with full scope of treatment. Continue Bactrim daily and azithromycin weekly to prevent opportunistic infections.Prognosis remains poor.    Encephalopathy acute Appears to be related to cryptococcal meningeal encephalitis. No change in  condition today, doubt he will meaningfully recover from this. Remains non-verbal.    AKI (acute kidney injury) ([place]) Resolved.    Protein-calorie malnutrition, severe Continue supplements. Body mass index is 20.3 kg/m. Eats with the help of nursing staff.    Anemia of chronic disease/thrombocytopenia/Leukopenia Hematology following. 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 Transfuse when necessary per recommendations. Continue to monitor blood counts closely. Blood counts not checked today. Check again tomorrow.    3 full thickness wounds to bilat buttocks; 2X.5X.5cm, 1X1X1cm, 5.5X3X.2cm Continue wound care per WOC. Per her notes: "Pt had I&D of buttock abscesses in May with the surgical team, according to the EMR.  These wounds were present on admission but are NOT pressure injuries."    Deep tissue injuries bilateral heels Wound care per wound care team. Floating heels.    Hypokalemia Potassium has been added to IV fluids, and has normalized.    Hyponatremia Mild, monitor.  Family Communication/Anticipated D/C date and plan/Code Status   DVT prophylaxis: SCDs ordered. Code Status: Full Code.  Family Communication: No family presently at the bedside.Palliative will touch base with guardian after guardianship hearing. Disposition Plan: Will need a skilled level of care once goals of care established unless family opts for comfort, in which case residential hospice would be appropriate.   Medical Consultants:    Infectious Disease  Palliative Care  Neurology  Oncology   Anti-Infectives:    Fluconazole 02/16/17--->  Septra resumed 02/22/17  Azithromycin resumed 02/22/17  Subjective:   Patient is nonverbal.  Objective:    Vitals:   02/27/17 2038 02/28/17 0013 02/28/17 0513 02/28/17 0737  BP: 117/83 116/73 119/75 118/79  Pulse: 64 63 76 63  Resp: 14 14 12 12   Temp: 97.7 F (36.5 C) 97.8 F (  36.6 C) 98.1 F (36.7 C) (!) 97.5 F (36.4 C)  TempSrc: Oral  Axillary Oral Axillary  SpO2: 100% 96% 96% 99%  Weight:      Height:        Intake/Output Summary (Last 24 hours) at 02/28/17 0739 Last data filed at 02/28/17 0600  Gross per 24 hour  Intake             1490 ml  Output             2175 ml  Net             -685 ml   Filed Weights   02/16/17 1407  Weight: 57.1 kg (125 lb 12.8 oz)    Exam: General: No acute distress. Sitting up in bed with eyes closed. Cardiovascular: Heart sounds show a regular rate, and rhythm. No gallops or rubs. No murmurs. No JVD. Lungs: Clear to auscultation bilaterally with good air movement. No rales, rhonchi or wheezes. Abdomen: Soft, nontender, nondistended with normal active bowel sounds. No masses. No hepatosplenomegaly. Neurological: Nonverbal, opens eyes and tracks, but does not follow commands.. Skin: Warm and dry. No rashes or lesions. Multiple tattoos. Extremities: No clubbing or cyanosis. No edema. Pedal pulses 2+. Heel protectors on.  Data Reviewed:   I have personally reviewed following labs and imaging studies:  Labs: Labs show the following:   Basic Metabolic Panel:  Recent Labs Lab 02/22/17 0711 02/23/17 0330 02/24/17 0333 02/26/17 0300  NA 137 134* 132* 133*  K 3.3* 3.4* 3.7 4.0  CL 111 108 106 107  CO2 21* 20* 21* 21*  GLUCOSE 86 82 82 85  BUN 13 13 11 12   CREATININE 0.51* 0.56* 0.58* 0.53*  CALCIUM 8.0* 8.0* 8.4* 8.5*   GFR Estimated Creatinine Clearance: 108.1 mL/min (A) (by C-G formula based on SCr of 0.53 mg/dL (L)). Liver Function Tests: No results for input(s): AST, ALT, ALKPHOS, BILITOT, PROT, ALBUMIN in the last 168 hours. No results for input(s): LIPASE, AMYLASE in the last 168 hours. No results for input(s): AMMONIA in the last 168 hours. Coagulation profile No results for input(s): INR, PROTIME in the last 168 hours.  CBC:  Recent Labs Lab 02/22/17 0711 02/23/17 0330 02/24/17 0333 02/25/17 0306 02/26/17 0300  WBC 2.4* 3.2* 1.9* 2.3* 2.3*  NEUTROABS  1.4* 2.7 1.2* 1.1* 1.3*  HGB 10.5* 10.8* 11.5* 9.9* 10.2*  HCT 32.2* 33.1* 34.8* 30.7* 31.5*  MCV 86.8 85.8 85.1 85.8 86.8  PLT 134* 136* 136* 129* 135*   Anemia work up: No results for input(s): VITAMINB12, FOLATE, FERRITIN, TIBC, IRON, RETICCTPCT in the last 72 hours. Sepsis Labs:  Recent Labs Lab 02/23/17 0330 02/24/17 0333 02/25/17 0306 02/26/17 0300  WBC 3.2* 1.9* 2.3* 2.3*    Microbiology Recent Results (from the past 240 hour(s))  CSF culture     Status: None   Collection Time: 02/18/17  2:00 PM  Result Value Ref Range Status   Specimen Description CSF  Final   Special Requests TUBE 2  Final   Gram Stain   Final    WBC PRESENT,BOTH PMN AND MONONUCLEAR YEAST CYTOSPIN SMEAR CRITICAL RESULT CALLED TO, READ BACK BY AND VERIFIED WITH: A. RODRIGUEZ RN, AT 0932 02/18/17 BY D. VANHOOK    Culture NO GROWTH 3 DAYS  Final   Report Status 02/21/2017 FINAL  Final  Culture, fungus without smear     Status: None (Preliminary result)   Collection Time: 02/18/17  2:00 PM  Result Value Ref Range  Status   Specimen Description CSF  Final   Special Requests TUBE 2  Final   Culture NO FUNGUS ISOLATED AFTER 7 DAYS  Final   Report Status PENDING  Incomplete  CSF culture     Status: None   Collection Time: 02/22/17 11:43 AM  Result Value Ref Range Status   Specimen Description CSF  Final   Special Requests TUBE 2  Final   Gram Stain   Final    CYTOSPIN SMEAR WBC PRESENT, PREDOMINANTLY MONONUCLEAR YEAST CORRECTED RESULTS PREVIOUSLY REPORTED AS: NO ORGANISMS SEEN CORRECTED RESULTS CALLED TO: RN Gilman Buttner [id number] MLM    Culture NO GROWTH 3 DAYS  Final   Report Status 02/25/2017 FINAL  Final    Procedures and diagnostic studies:  No results found.  Medications:   . amantadine  100 mg Oral BID WC  . azithromycin  1,200 mg Oral Weekly  . feeding supplement (ENSURE ENLIVE)  237 mL Oral BID BM  . feeding supplement (PRO-STAT SUGAR FREE 64)  30 mL Oral BID  .  multivitamin with minerals  1 tablet Oral Daily  . nystatin  5 mL Oral QID  . polyvinyl alcohol  1 drop Both Eyes QID  . sulfamethoxazole-trimethoprim  1 tablet Oral Daily  . tamsulosin  0.4 mg Oral Daily   Continuous Infusions: . 0.9 % NaCl with KCl 40 mEq / L 50 mL/hr (02/27/17 1309)  . fluconazole (DIFLUCAN) IV Stopped (02/27/17 1459)    Patient remains relatively stable and a neurologically compromised state. Level II visit.   LOS: 13 days   [facility] Pager [phone]. If unable to reach me by pager, please call my cell phone at [phone].  *Please refer to [email or web address], password [email or web address] to get updated schedule on who will round on this patient, as hospitalists switch teams weekly. If 7PM-7AM, please contact night-coverage at [email or web address], password [email or web address] for any overnight needs.  02/28/2017, 7:39 AM

## 2017-02-28 NOTE — Plan of Care (Signed)
Problem: Education: Goal: Knowledge of [facility] Education information/materials will improve Outcome: Not Progressing Nonverbal, unable to participate in education, no family at bedside at this time.  Problem: Health Behavior/Discharge Planning: Goal: Ability to manage health-related needs will improve Outcome: Not Progressing Not able to manage health needs, no family at bedside at this time  Problem: Physical Regulation: Goal: Ability to maintain clinical measurements within normal limits will improve Outcome: Progressing VSS Goal: Will remain free from infection Outcome: Progressing Antibiotics and antifungals administered (see MAR), foley care done BID  Problem: Activity: Goal: Risk for activity intolerance will decrease Outcome: Not Progressing Not assisting with movement in bed, turns q2hrs

## 2017-03-01 DIAGNOSIS — G9340 Encephalopathy, unspecified: Secondary | ICD-10-CM

## 2017-03-01 DIAGNOSIS — Z7189 Other specified counseling: Secondary | ICD-10-CM

## 2017-03-01 LAB — FUNGUS CULTURE RESULT

## 2017-03-01 LAB — FUNGUS CULTURE WITH STAIN

## 2017-03-01 LAB — CBC
HCT: 33.9 % — ABNORMAL LOW (ref 39.0–52.0)
HEMOGLOBIN: 10.8 g/dL — AB (ref 13.0–17.0)
MCH: 27.3 pg (ref 26.0–34.0)
MCHC: 31.9 g/dL (ref 30.0–36.0)
MCV: 85.8 fL (ref 78.0–100.0)
Platelets: 146 10*3/uL — ABNORMAL LOW (ref 150–400)
RBC: 3.95 MIL/uL — AB (ref 4.22–5.81)
RDW: 15.4 % (ref 11.5–15.5)
WBC: 2.5 10*3/uL — ABNORMAL LOW (ref 4.0–10.5)

## 2017-03-01 LAB — COMPREHENSIVE METABOLIC PANEL
ALK PHOS: 115 U/L (ref 38–126)
ALT: 28 U/L (ref 17–63)
AST: 26 U/L (ref 15–41)
Albumin: 2.6 g/dL — ABNORMAL LOW (ref 3.5–5.0)
Anion gap: 7 (ref 5–15)
BUN: 12 mg/dL (ref 6–20)
CALCIUM: 8.8 mg/dL — AB (ref 8.9–10.3)
CO2: 21 mmol/L — AB (ref 22–32)
Chloride: 104 mmol/L (ref 101–111)
Creatinine, Ser: 0.48 mg/dL — ABNORMAL LOW (ref 0.61–1.24)
GFR calc Af Amer: >60 mL/min (ref >60)
GFR calc non Af Amer: >60 mL/min (ref >60)
Glucose, Bld: 85 mg/dL (ref 65–99)
Potassium: 3.9 mmol/L (ref 3.5–5.1)
SODIUM: 132 mmol/L — AB (ref 135–145)
TOTAL PROTEIN: 8.8 g/dL — AB (ref 6.5–8.1)
Total Bilirubin: 0.5 mg/dL (ref 0.3–1.2)

## 2017-03-01 LAB — FUNGAL ORGANISM REFLEX

## 2017-03-01 NOTE — Progress Notes (Signed)
PROGRESS NOTE    William Pena  WNI:[id number] DOB: [birth year] DOA: 02/15/2017 PCP: William Quant, MD    Brief Narrative:  31 y.o. 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 Initial impression from ID was that he had relapsed cryptococcal meningitis.  Assessment & Plan:   Principal Problem:   Sepsis (William Pena) Active Problems:   AIDS (acquired immune deficiency syndrome) (HCC)   Encephalopathy acute   Cryptococcal meningoencephalitis (HCC)   HIV (human immunodeficiency virus infection) ([name])   AKI (acute kidney injury) (William Pena)   Protein-calorie malnutrition, severe   Anemia of chronic disease   Acute encephalopathy   Goals of care, counseling/discussion   Palliative care by specialist   Leukopenia   Deep tissue injury bilateral heels   Wound of buttock   Hypokalemia   Encephalopathy  Principal Problem:   Sepsis ([name] to cryptococcal meningeal encephalitis Being treated for relapsed cryptococcal meningitis with fluconazole. Will need 8 weeks of therapy per ID (started 02/16/17 with stopping date 04/13/17), and will likely need 200 mg of fluconazole as oral therapy for another 10 months to equal 12 months of treatment. - Patient overall remains medically stable - Does not seem to be any appreciable change in condition  Active Problems:   AIDS (acquired immune deficiency syndrome) (HCC)/HIV infection Unable to start antiretroviral therapy at this time due to the risk of immune reconstitution inflammatory syndrome per ID. Palliative care consulted with goals of care meeting held 02/22/17, with full scope of care recommended due to differing opinions from family regarding how to proceed. Lengthy discussion held with palliative care regarding treatment planning  for this patient 02/23/17.  Guardianship hearing was held 02/28/17. - Per palliative care, wishes are for continued full treatment and Full Code. When/if patient decompensates, then family to determine next course of action then - Patient is continued on Bactrim daily and azithromycin weekly to prevent opportunistic infections. - Prognosis remains poor. Appreciate in put by Palliative Care    Encephalopathy acute Appears to be related to cryptococcal meningeal encephalitis.  - No significant change in condition was noted - Patient remains non-verbal.    AKI (acute kidney injury) (William Pena) - Suspect presenting AKI secondary to dehydration - Renal function has resolved    Protein-calorie malnutrition, severe - Patient is continued on supplements.  - Body mass index is 20.3 kg/m.     Anemia of chronic disease/thrombocytopenia/Leukopenia - Hematology following.  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  - Continue to transfuse as needed. - Repeat CBC in AM    3 full thickness wounds to bilat buttocks; 2X.5X.5cm, 1X1X1cm, 5.5X3X.2cm - Continue wound care per WOC. Per her notes: "Pt had I&D of buttock abscesses in May with the surgical team, according to the EMR. Thesewounds were present on admission but are NOT pressure injuries." - Stable at present    Deep tissue injuries bilateral heels - Wound care per wound care team.  - Floating heels.    Hypokalemia - Potassium has been added to IV fluids - Stable at present. - Repeat bmet in AM    Hyponatremia - Mild, monitor. -Stable at present  DVT prophylaxis: SCD's Code Status: Full Family Communication: Pt in room, patient's mother at bedside Disposition Plan: Uncertain at this time  Consultants:   ID  Neurology  Procedures:   Intermiitent  LP's  Antimicrobials: Anti-infectives    Start     Dose/Rate Route Frequency Ordered Stop   02/22/17 2000  azithromycin (ZITHROMAX) tablet 1,200 mg     1,200  mg Oral Weekly 02/22/17 1812     02/22/17 2000  sulfamethoxazole-trimethoprim (BACTRIM DS,SEPTRA DS) 800-160 MG per tablet 1 tablet     1 tablet Oral Daily 02/22/17 1812     02/17/17 1400  levofloxacin (LEVAQUIN) IVPB 750 mg  Status:  Discontinued     750 mg 100 mL/hr over 90 Minutes Intravenous Every 48 hours 02/15/17 1515 02/16/17 1043   02/17/17 1000  azithromycin (ZITHROMAX) tablet 1,200 mg  Status:  Discontinued     1,200 mg Oral Every Fri 02/15/17 1615 02/16/17 1308   02/16/17 1330  ceFEPIme (MAXIPIME) 2 g in dextrose 5 % 50 mL IVPB  Status:  Discontinued     2 g 100 mL/hr over 30 Minutes Intravenous Every 24 hours 02/15/17 1515 02/16/17 1043   02/16/17 1200  fluconazole (DIFLUCAN) IVPB 400 mg     400 mg 100 mL/hr over 120 Minutes Intravenous Every 24 hours 02/16/17 1048     02/16/17 1000  fluconazole (DIFLUCAN) tablet 400 mg  Status:  Discontinued     400 mg Oral Daily 02/15/17 1615 02/16/17 1048   02/16/17 1000  sulfamethoxazole-trimethoprim (BACTRIM DS,SEPTRA DS) 800-160 MG per tablet 1 tablet  Status:  Discontinued     1 tablet Oral Daily 02/15/17 1615 02/16/17 1308   02/16/17 0200  vancomycin (VANCOCIN) 500 mg in sodium chloride 0.9 % 100 mL IVPB  Status:  Discontinued     500 mg 100 mL/hr over 60 Minutes Intravenous Every 12 hours 02/15/17 1515 02/16/17 1043   02/15/17 1315  ceFEPIme (MAXIPIME) 2 g in dextrose 5 % 50 mL IVPB     2 g 100 mL/hr over 30 Minutes Intravenous  Once 02/15/17 1307 02/15/17 1436   02/15/17 1300  levofloxacin (LEVAQUIN) IVPB 750 mg     750 mg 100 mL/hr over 90 Minutes Intravenous  Once 02/15/17 1254 02/15/17 1533   02/15/17 1300  aztreonam (AZACTAM) 2 g in dextrose 5 % 50 mL IVPB  Status:  Discontinued     2 g 100 mL/hr over 30 Minutes Intravenous  Once 02/15/17 1254 02/15/17 1307   02/15/17 1300  vancomycin (VANCOCIN) IVPB 1000 mg/200 mL premix     1,000 mg 200 mL/hr over 60 Minutes Intravenous  Once 02/15/17 1254 02/15/17 1436       Subjective: Non-verbal this   Objective: Vitals:   03/01/17 0300 03/01/17 0400 03/01/17 0500 03/01/17 0800  BP: (!) 120/95 120/84 117/81 117/86  Pulse: 66 (!) 55 (!) 52 66  Resp: 11 (!) 8 18 (!) 9  Temp:   (!) 97.5 F (36.4 C) 98.3 F (36.8 C)  TempSrc:   Oral Axillary  SpO2: 98% 98% 100% 99%  Weight:      Height:        Intake/Output Summary (Last 24 hours) at 03/01/17 1446 Last data filed at 03/01/17 1306  Gross per 24 hour  Intake              420 ml  Output             2600 ml  Net            -2180 ml   Filed Weights   02/16/17 1407  Weight: 57.1 kg (125 lb 12.8 oz)    Examination:  General exam: Appears calm  and comfortable  Respiratory system: Clear to auscultation. Respiratory effort normal. Cardiovascular system: S1 & S2 heard, RRR. Gastrointestinal system: Abdomen is nondistended, soft and nontender. No organomegaly or masses felt. Normal bowel sounds heard. Central nervous system: Alert. No focal neurological deficits. nonverbal Extremities: Symmetric 5 x 5 power. Skin: No rashes, lesions  Psychiatry:unable to assess as patient is non-verbal at baseline.   Data Reviewed: I have personally reviewed following labs and imaging studies  CBC:  Recent Labs Lab 02/23/17 0330 02/24/17 0333 02/25/17 0306 02/26/17 0300 03/01/17 0325  WBC 3.2* 1.9* 2.3* 2.3* 2.5*  NEUTROABS 2.7 1.2* 1.1* 1.3*  --   HGB 10.8* 11.5* 9.9* 10.2* 10.8*  HCT 33.1* 34.8* 30.7* 31.5* 33.9*  MCV 85.8 85.1 85.8 86.8 85.8  PLT 136* 136* 129* 135* 371*   Basic Metabolic Panel:  Recent Labs Lab 02/23/17 0330 02/24/17 0333 02/26/17 0300 03/01/17 0325  NA 134* 132* 133* 132*  K 3.4* 3.7 4.0 3.9  CL 108 106 107 104  CO2 20* 21* 21* 21*  GLUCOSE 82 82 85 85  BUN 13 11 12 12   CREATININE 0.56* 0.58* 0.53* 0.48*  CALCIUM 8.0* 8.4* 8.5* 8.8*   GFR: Estimated Creatinine Clearance: 108.1 mL/min (A) (by C-G formula based on SCr of 0.48 mg/dL (L)). Liver Function  Tests:  Recent Labs Lab 03/01/17 0325  AST 26  ALT 28  ALKPHOS 115  BILITOT 0.5  PROT 8.8*  ALBUMIN 2.6*   No results for input(s): LIPASE, AMYLASE in the last 168 hours. No results for input(s): AMMONIA in the last 168 hours. Coagulation Profile: No results for input(s): INR, PROTIME in the last 168 hours. Cardiac Enzymes: No results for input(s): CKTOTAL, CKMB, CKMBINDEX, TROPONINI in the last 168 hours. BNP (last 3 results) No results for input(s): PROBNP in the last 8760 hours. HbA1C: No results for input(s): HGBA1C in the last 72 hours. CBG: No results for input(s): GLUCAP in the last 168 hours. Lipid Profile: No results for input(s): CHOL, HDL, LDLCALC, TRIG, CHOLHDL, LDLDIRECT in the last 72 hours. Thyroid Function Tests: No results for input(s): TSH, T4TOTAL, FREET4, T3FREE, THYROIDAB in the last 72 hours. Anemia Panel: No results for input(s): VITAMINB12, FOLATE, FERRITIN, TIBC, IRON, RETICCTPCT in the last 72 hours. Sepsis Labs: No results for input(s): PROCALCITON, LATICACIDVEN in the last 168 hours.  Recent Results (from the past 240 hour(s))  CSF culture     Status: None   Collection Time: 02/22/17 11:43 AM  Result Value Ref Range Status   Specimen Description CSF  Final   Special Requests TUBE 2  Final   Gram Stain   Final    CYTOSPIN SMEAR WBC PRESENT, PREDOMINANTLY MONONUCLEAR YEAST CORRECTED RESULTS PREVIOUSLY REPORTED AS: NO ORGANISMS SEEN CORRECTED RESULTS CALLED TO: RN Gilman Buttner [id number] MLM    Culture NO GROWTH 3 DAYS  Final   Report Status 02/25/2017 FINAL  Final     Radiology Studies: No results found.  Scheduled Meds: . amantadine  100 mg Oral BID WC  . azithromycin  1,200 mg Oral Weekly  . feeding supplement (ENSURE ENLIVE)  237 mL Oral BID BM  . feeding supplement (PRO-STAT SUGAR FREE 64)  30 mL Oral BID  . multivitamin with minerals  1 tablet Oral Daily  . nystatin  5 mL Oral QID  . polyvinyl alcohol  1 drop Both Eyes QID   . sulfamethoxazole-trimethoprim  1 tablet Oral Daily  . tamsulosin  0.4 mg Oral Daily   Continuous Infusions: .  0.9 % NaCl with KCl 40 mEq / L 50 mL/hr (03/01/17 1047)  . fluconazole (DIFLUCAN) IV 400 mg (03/01/17 1136)     LOS: 14 days   Eddis Pingleton, Orpah Melter, MD [facility] Pager [phone]  If 7PM-7AM, please contact night-coverage [email or web address] Password [email or web address] 03/01/2017, 2:46 PM

## 2017-03-01 NOTE — Progress Notes (Signed)
Subjective:  nonverbal   Antibiotics:  Anti-infectives    Start     Dose/Rate Route Frequency Ordered Stop   02/22/17 2000  azithromycin (ZITHROMAX) tablet 1,200 mg     1,200 mg Oral Weekly 02/22/17 1812     02/22/17 2000  sulfamethoxazole-trimethoprim (BACTRIM DS,SEPTRA DS) 800-160 MG per tablet 1 tablet     1 tablet Oral Daily 02/22/17 1812     02/17/17 1400  levofloxacin (LEVAQUIN) IVPB 750 mg  Status:  Discontinued     750 mg 100 mL/hr over 90 Minutes Intravenous Every 48 hours 02/15/17 1515 02/16/17 1043   02/17/17 1000  azithromycin (ZITHROMAX) tablet 1,200 mg  Status:  Discontinued     1,200 mg Oral Every Fri 02/15/17 1615 02/16/17 1308   02/16/17 1330  ceFEPIme (MAXIPIME) 2 g in dextrose 5 % 50 mL IVPB  Status:  Discontinued     2 g 100 mL/hr over 30 Minutes Intravenous Every 24 hours 02/15/17 1515 02/16/17 1043   02/16/17 1200  fluconazole (DIFLUCAN) IVPB 400 mg     400 mg 100 mL/hr over 120 Minutes Intravenous Every 24 hours 02/16/17 1048     02/16/17 1000  fluconazole (DIFLUCAN) tablet 400 mg  Status:  Discontinued     400 mg Oral Daily 02/15/17 1615 02/16/17 1048   02/16/17 1000  sulfamethoxazole-trimethoprim (BACTRIM DS,SEPTRA DS) 800-160 MG per tablet 1 tablet  Status:  Discontinued     1 tablet Oral Daily 02/15/17 1615 02/16/17 1308   02/16/17 0200  vancomycin (VANCOCIN) 500 mg in sodium chloride 0.9 % 100 mL IVPB  Status:  Discontinued     500 mg 100 mL/hr over 60 Minutes Intravenous Every 12 hours 02/15/17 1515 02/16/17 1043   02/15/17 1315  ceFEPIme (MAXIPIME) 2 g in dextrose 5 % 50 mL IVPB     2 g 100 mL/hr over 30 Minutes Intravenous  Once 02/15/17 1307 02/15/17 1436   02/15/17 1300  levofloxacin (LEVAQUIN) IVPB 750 mg     750 mg 100 mL/hr over 90 Minutes Intravenous  Once 02/15/17 1254 02/15/17 1533   02/15/17 1300  aztreonam (AZACTAM) 2 g in dextrose 5 % 50 mL IVPB  Status:  Discontinued     2 g 100 mL/hr over 30 Minutes Intravenous  Once  02/15/17 1254 02/15/17 1307   02/15/17 1300  vancomycin (VANCOCIN) IVPB 1000 mg/200 mL premix     1,000 mg 200 mL/hr over 60 Minutes Intravenous  Once 02/15/17 1254 02/15/17 1436      Medications: Scheduled Meds: . amantadine  100 mg Oral BID WC  . azithromycin  1,200 mg Oral Weekly  . feeding supplement (ENSURE ENLIVE)  237 mL Oral BID BM  . feeding supplement (PRO-STAT SUGAR FREE 64)  30 mL Oral BID  . multivitamin with minerals  1 tablet Oral Daily  . nystatin  5 mL Oral QID  . polyvinyl alcohol  1 drop Both Eyes QID  . sulfamethoxazole-trimethoprim  1 tablet Oral Daily  . tamsulosin  0.4 mg Oral Daily   Continuous Infusions: . 0.9 % NaCl with KCl 40 mEq / L 50 mL/hr (03/01/17 1047)  . fluconazole (DIFLUCAN) IV Stopped (03/01/17 1336)   PRN Meds:.acetaminophen **OR** acetaminophen, HYDROcodone-acetaminophen, polyethylene glycol, polyvinyl alcohol    Objective: Weight change:   Intake/Output Summary (Last 24 hours) at 03/01/17 1958 Last data filed at 03/01/17 1730  Gross per 24 hour  Intake  420 ml  Output             3100 ml  Net            -2680 ml   Blood pressure (!) 126/95, pulse 68, temperature 98.2 F (36.8 C), temperature source Oral, resp. rate 16, height 5\' 6"  (1.676 m), weight 125 lb 12.8 oz (57.1 kg), SpO2 100 %. Temp:  [97.5 F (36.4 C)-98.3 F (36.8 C)] 98.2 F (36.8 C) (08/01 1637) Pulse Rate:  [52-81] 68 (08/01 1637) Resp:  [8-18] 16 (08/01 1637) BP: (112-133)/(79-95) 126/95 (08/01 1637) SpO2:  [96 %-100 %] 100 % (08/01 1637)  Physical Exam: General: Alert and awake,non communictative, HEENT: anicteric sclera, pupils reactive to light and accommodation, EOMI CVS regular rate, normal r,  no murmur rubs or gallops Chest: clear to auscultation bilaterally, no wheezing, rales or rhonchi Abdomen: soft nontender, nondistended, normal bowel sounds, Neuro: contractures  CBC:  CBC Latest Ref Rng & Units 03/01/2017 02/26/2017 02/25/2017  WBC  4.0 - 10.5 K/uL 2.5(L) 2.3(L) 2.3(L)  Hemoglobin 13.0 - 17.0 g/dL 10.8(L) 10.2(L) 9.9(L)  Hematocrit 39.0 - 52.0 % 33.9(L) 31.5(L) 30.7(L)  Platelets 150 - 400 K/uL 146(L) 135(L) 129(L)      BMET  Recent Labs  03/01/17 0325  NA 132*  K 3.9  CL 104  CO2 21*  GLUCOSE 85  BUN 12  CREATININE 0.48*  CALCIUM 8.8*     Liver Panel   Recent Labs  03/01/17 0325  PROT 8.8*  ALBUMIN 2.6*  AST 26  ALT 28  ALKPHOS 115  BILITOT 0.5       Sedimentation Rate No results for input(s): ESRSEDRATE in the last 72 hours. C-Reactive Protein No results for input(s): CRP in the last 72 hours.  Micro Results: Recent Results (from the past 720 hour(s))  Culture, blood (Routine x 2)     Status: None   Collection Time: 02/15/17 12:45 PM  Result Value Ref Range Status   Specimen Description BLOOD LEFT ANTECUBITAL  Final   Special Requests   Final    BOTTLES DRAWN AEROBIC ONLY Blood Culture adequate volume   Culture NO GROWTH 5 DAYS  Final   Report Status 02/20/2017 FINAL  Final  Culture, blood (Routine x 2)     Status: None   Collection Time: 02/15/17 12:51 PM  Result Value Ref Range Status   Specimen Description BLOOD RIGHT ANTECUBITAL  Final   Special Requests IN PEDIATRIC BOTTLE Blood Culture adequate volume  Final   Culture NO GROWTH 5 DAYS  Final   Report Status 02/20/2017 FINAL  Final  CSF culture     Status: None   Collection Time: 02/16/17  1:29 PM  Result Value Ref Range Status   Specimen Description CSF  Final   Special Requests NONE  Final   Gram Stain   Final    WBC PRESENT, PREDOMINANTLY PMN YEAST CYTOSPIN SMEAR CRITICAL RESULT CALLED TO, READ BACK BY AND VERIFIED WITHCandie Chroman RN AT [id number] ON [id number] BY SJW    Culture NO GROWTH 3 DAYS  Final   Report Status 02/19/2017 FINAL  Final  Fungus Culture With Stain     Status: None (Preliminary result)   Collection Time: 02/16/17  1:29 PM  Result Value Ref Range Status   Fungus Stain Final report  Final     Comment: (NOTE) Performed At: [facility] [place], [place] [id number] Lindon Romp MD ON:[id number]    Fungus (Mycology) Culture  PENDING  Incomplete   Fungal Source CSF  Final  Fungus Culture Result     Status: None   Collection Time: 02/16/17  1:29 PM  Result Value Ref Range Status   Result 1 Comment  Final    Comment: (NOTE) KOH/Calcofluor preparation:  no fungus observed. Performed At: [facility] [place], [place] [id number] Lindon Romp MD PX:[id number]   MRSA PCR Screening     Status: None   Collection Time: 02/16/17  2:44 PM  Result Value Ref Range Status   MRSA by PCR NEGATIVE NEGATIVE Final    Comment:        The GeneXpert MRSA Assay (FDA approved for NASAL specimens only), is one component of a comprehensive MRSA colonization surveillance program. It is not intended to diagnose MRSA infection nor to guide or monitor treatment for MRSA infections.   CSF culture     Status: None   Collection Time: 02/18/17  2:00 PM  Result Value Ref Range Status   Specimen Description CSF  Final   Special Requests TUBE 2  Final   Gram Stain   Final    WBC PRESENT,BOTH PMN AND MONONUCLEAR YEAST CYTOSPIN SMEAR CRITICAL RESULT CALLED TO, READ BACK BY AND VERIFIED WITH: A. RODRIGUEZ RN, AT 1533 02/18/17 BY D. VANHOOK    Culture NO GROWTH 3 DAYS  Final   Report Status 02/21/2017 FINAL  Final  Culture, fungus without smear     Status: None (Preliminary result)   Collection Time: 02/18/17  2:00 PM  Result Value Ref Range Status   Specimen Description CSF  Final   Special Requests TUBE 2  Final   Culture NO FUNGUS ISOLATED AFTER 7 DAYS  Final   Report Status PENDING  Incomplete  CSF culture     Status: None   Collection Time: 02/22/17 11:43 AM  Result Value Ref Range Status   Specimen Description CSF  Final   Special Requests TUBE 2  Final   Gram Stain   Final    CYTOSPIN SMEAR WBC PRESENT, PREDOMINANTLY  MONONUCLEAR YEAST CORRECTED RESULTS PREVIOUSLY REPORTED AS: NO ORGANISMS SEEN CORRECTED RESULTS CALLED TO: RN Gilman Buttner [id number] MLM    Culture NO GROWTH 3 DAYS  Final   Report Status 02/25/2017 FINAL  Final    Studies/Results: No results found.    Assessment/Plan:  INTERVAL HISTORY:  Palliative care with further discussions with mother and father   Principal Problem:   Sepsis ([place]) Active Problems:   AIDS (acquired immune deficiency syndrome) ([place])   Encephalopathy acute   Cryptococcal meningoencephalitis (HCC)   HIV (human immunodeficiency virus infection) ([place])   AKI (acute kidney injury) ([place])   Protein-calorie malnutrition, severe   Anemia of chronic disease   Acute encephalopathy   Goals of care, counseling/discussion   Palliative care by specialist   Leukopenia   Deep tissue injury bilateral heels   Wound of buttock   Hypokalemia   Encephalopathy    William Pena is a 31 y.o. male with  HIV/AIDS mx medical problems, perirectal abscess, cryptococcal pneumonia and meningoencephalitis requiring IVD, still with high opening pressures this admission, NEVER started on ARV due to fear for uncontrolled IRIS and ICP absent normalized ICP and absent mechanical means of controlling ICP (ie with shunt)  #1 Cryptococcal meningoencephalitis:  IF we are going to pursue aggressive care then we CERTAINLY NEED REPEAT LP and if ICP is coming up again reconsult Neurosurgery for placement of shunt  LP tomorrow with  OPENING PRESSURE  CSF cell count and diff, glucose and protein, crypto ag and CSF culture  Remove sufficient CSF to drop closing pressure <20 cmH20  AS ABOVE WILL NEED RECONSULT NEUROSURGERY if desire is for aggressive care  Continue fluconazole  #2 Encephalopathy: IDEALLY I WOULD LIKE TO START ARV IF we are to be aggressive as it is possible that the HIV is directly affecting CNS and causing encephalopathy.  Certainly his high ICP could be playing a  role and he MAY have suffered permanent Neuro insults  #3 HIV/AIDS: IF we are going to pursue aggressive care he DESPERATELY needs ARV to be intiated. I remain VERY concerned for IRIS. I would feel MUCH more comfortable knowing that his ICP were controlled and FRANKLY would feel more comfortabble if he had shunt in place to ensure control of ICP which MAY DRAMATICALLY WORSEN with IRIS  After obtaining LP, further data, discussion with family will consider starting ARV to see if this can improve his Neuro status  #4 Goals of care: see palliative care note and greatly appreciate their help here.   LOS: 14 days   Alcide Evener 03/01/2017, 7:58 PM

## 2017-03-01 NOTE — Progress Notes (Signed)
Daily Progress Note   Patient Name: William Pena       Date: 03/01/2017 DOB: [birth year]  Age: 31 y.o. MRN#: [id number] Attending Physician: Donne Hazel, MD Primary Care Physician: [name], MD Admit Date: 02/15/2017  Reason for Consultation/Follow-up: Establishing goals of care, Interfamily conflict and Psychosocial/spiritual support  Subjective: Quinn is unchanged from when I last saw him. [name] awake with his eyes open. He will track me with his eyes. No signs of distress or discomfort. He has continues to accept medication orally, and does continue to eat and drink. [name] otherwise non-interactive. He does not respond to questions, commands, or demonstrate intentional movement.   Length of Stay: 14  Current Medications: Scheduled Meds:  . amantadine  100 mg Oral BID WC  . azithromycin  1,200 mg Oral Weekly  . feeding supplement (ENSURE ENLIVE)  237 mL Oral BID BM  . feeding supplement (PRO-STAT SUGAR FREE 64)  30 mL Oral BID  . multivitamin with minerals  1 tablet Oral Daily  . nystatin  5 mL Oral QID  . polyvinyl alcohol  1 drop Both Eyes QID  . sulfamethoxazole-trimethoprim  1 tablet Oral Daily  . tamsulosin  0.4 mg Oral Daily    Continuous Infusions: . 0.9 % NaCl with KCl 40 mEq / L 50 mL/hr (02/27/17 1309)  . fluconazole (DIFLUCAN) IV Stopped (02/28/17 1420)    PRN Meds: acetaminophen **OR** acetaminophen, HYDROcodone-acetaminophen, polyethylene glycol, polyvinyl alcohol  Physical Exam   Constitution: Frail man lying in bed. HENT:  Head: Normocephalic and atraumatic.  Mucous membranes moist. Nose: Nose normal.  Eyes:  Right pupil slightly larger than left. Will track me but seems to be staring through me rather than looking at me. Neck: Normal range of motion. Neck supple.  Pulmonary/Chest: Effort  normal. Breathing comfortably in bed, no signs of distress.  Musculoskeletal:  UTA, arms contracted. Right fingers with rare spastic/jerks, no movement seen on RLE. LUE with occasional non-purposeful movement, no movement observed on LLE. Significant muscle wasting.  Neurological:  Pt is awake. His eyes are open and he will track me, but seems to be staring through me. Not speaking or following commands. Not blinking on command consistently. Skin: Skin is warm and dry.  Extensive tatoos  Psychiatric:  Calm in bed, otherwise non-interactive         Vital Signs: BP 117/86   Pulse 66   Temp 98.3 F (36.8 C) (Axillary)   Resp (!) 9   Ht _0  (1.676 m)   Wt 57.1 kg (125 lb 12.8 oz)   SpO2 99%   BMI 20.30 kg/m  SpO2: SpO2: 99 % O2 Device: O2 Device: Not Delivered O2 Flow Rate: O2 Flow Rate (L/min): 2 L/min  Intake/output summary:   Intake/Output Summary (Last 24 hours) at 03/01/17 1015 Last data filed at 03/01/17 0827  Gross per 24 hour  Intake           521.67 ml  Output             1900 ml  Net         -1378.33 ml   LBM: Last BM Date: 02/23/17 Baseline Weight: Weight: 57.1 kg (  125 lb 12.8 oz) Most recent weight: Weight: 57.1 kg (125 lb 12.8 oz)  Palliative Assessment/Data: PPS 30%   Flowsheet Rows     Most Recent Value  Intake Tab  Referral Department  Hospitalist  Unit at Time of Referral  ICU  Palliative Care Primary Diagnosis  Sepsis/Infectious Disease  Date Notified  02/21/17  Palliative Care Type  New Palliative care  Reason for referral  Clarify Goals of Care  Date of Admission  02/15/17  # of days IP prior to Palliative referral  6  Clinical Assessment  Psychosocial & Spiritual Assessment  Palliative Care Outcomes      Patient Active Problem List   Diagnosis Date Noted  . Hypokalemia 02/23/2017  . Encephalopathy   . Leukopenia 02/22/2017  . Deep tissue injury bilateral heels 02/22/2017  . Wound of buttock 02/22/2017  . Goals of care,  counseling/discussion   . Palliative care by specialist   . Sepsis ([place]) 02/16/2017  . Acute encephalopathy 02/15/2017  . Acute urinary retention 02/08/2017  . Genital herpes 02/08/2017  . Anemia of chronic disease 02/08/2017  . Constipation 02/08/2017  . Protein-calorie malnutrition, severe 01/28/2017  . Pressure injury of skin 01/16/2017  . AKI (acute kidney injury) ([place])   . HIV (human immunodeficiency virus infection) ([place])   . Meningitis   . 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 Acute blood loss anemia   . Elevated intracranial pressure   . HIV disease ([place])   . Lymphadenopathy of head and neck   . Rectal abscess   . 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 Cavitary pneumonia   . Diffuse lymphadenopathy   . Drug rash   . Hypomagnesemia 12/23/2016  . Encephalopathy acute 12/23/2016  . SIRS (systemic inflammatory response syndrome) ([place]) 12/23/2016  . Acute respiratory failure ([place]) 12/23/2016  . Pulmonary infiltrates   . Perirectal abscess 12/21/2016  . Hyponatremia 12/21/2016  . Normocytic anemia 12/21/2016  . Elevated LFTs 12/21/2016  . AIDS (acquired immune deficiency syndrome) ([place]) 07/07/2016  . Chronic pain 06/20/2011    Palliative Care Assessment & Plan   HPI: 31 y.o. 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 He was recently here 1/60-7/3 with a complicated course due to disseminated cryptococcal infection and cryptococcal meningoencephalitis. He now presents from SNF with worsening encephalopathy and decreased oral intake. He was admitted on 02/15/2017. On admission he was was found to be septic in the setting of recurrent cryptococcal meningitis; ID following. He also had increased ICP for which he is having serial  LPs to monitor (VP shunt to be considered if ICPs climb again); Neurology consulted and advised. 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 Palliative consulted to assist family in clarifying goals of care given the patient's recurrent health issues and now significant failure to thrive.   Assessment: I had a long conversation with both the patient's mother and father 7/25 exploring their perceptions of Branden's wishes regarding goals of care and advanced directives. In accordance with the clarification provided by the patient's Guardian ad Litem Glendora Score [id number]), the statutory medical decision makers are his parents. Unfortunately, his parents have significantly different opinions on what Tuff would want and how he is doing. His mother relates that Adonias has been talking about  death and dying, and that he would not want to live in this state of prolonged debility. She also feels he is suffering by having his life prolonged with aggressive medical interventions. While she has not yet been able to verbalize a desire for DNR status or withdrawal of aggressive interventions, she is clearly in a contemplative stage and open to considering a more comfort focused approach. Marguis's father, on the other hand, feels that Kapena "is a Nurse, adult" who would want to live no matter what his life looks like. Mansour had not discussed death or dying with him, and Broadus John understood his sons wishes to be full scope aggressive care indefinitely.   I followed-up with both [name] and Broadus John today. [name] reports that she did obtain guardianship for Kong through the court. She does not have the paperwork with her, but plans to bring a copy to the hospital tomorrow so we can have it on file. I had a difficult conversation with Broadus John today about his son's clinical status. Broadus John strongly believes that Richmond will suddenly wake  up and "be alright again." He also shares that he believes he is "fine right now." I explored this belief further and shared my concern that Danyell is not a healthy 31yo, and he has many medical issues that are limiting his ability to quickly heal and improve. When thinking about the path forward with a best case and worse case scenario, it's important to consider what Brenin would want in this situation. Broadus John was very angry and tearful and restated multiple times that Amery was his son and he couldn't sign off on his death. I acknowledged his love and fear, but he became too tearful and hung up the phone.   In talking with [name] I explained that with court appointed guardianship she is now the sole Media planner for [employer]. That said, I also recognized her need for family harmony and peace in decision making. [name] remains conflicted as she sees his decline, debility, and feels strongly he would not choose this life, however she is not yet ready to formalize the decision for comfort measures given his current stability (namely the resolution of sepsis and a clear plan for evaluating ICP, long term abx, and potential to re-start ARVs). At this point, she wants to continue the current full scope approach to care. If Bradie should acutely worsen or later have an issue that brings him back to the hospital, she will re-assess at that point to consider a more comfort focused approach. In terms of code status, we talked at length about that again today. She is going to seek out guidance from her pastor today and continue to pray on it. She asked that I check-in with her again tomorrow.   Recommendations/Plan:  Full code, full scope care, plan for d/c to rehab with Palliative (NOT HOSPICE) once medically ready  [name] to continue thinking about code status, I will check-in with her again tomorrow  [name] will bring in guardianship paperwork tomorrow so we can place in chart  Goals of Care and Additional  Recommendations:  Limitations on Scope of Treatment: Full Scope Treatment  Code Status:  Full code  Prognosis:   Unable to determine. Very poor prognosis overall.   Discharge Planning:  [place] for rehab with Palliative care service follow-up  Care plan was discussed with pt's mother and father.  Thank you for allowing the Palliative Medicine Team to assist in the care of this patient.  Time in/out: 1000/1030 Total  time: 65 minutes    Greater than 50%  of this time was spent counseling and coordinating care related to the above assessment and plan.  Charlynn Court, NP Palliative Medicine Team [phone] pager (7a-5p) Team Phone # [phone]

## 2017-03-02 ENCOUNTER — Inpatient Hospital Stay (HOSPITAL_COMMUNITY): Payer: Medicaid Other

## 2017-03-02 DIAGNOSIS — N179 Acute kidney failure, unspecified: Secondary | ICD-10-CM

## 2017-03-02 DIAGNOSIS — G0490 Encephalitis and encephalomyelitis, unspecified: Secondary | ICD-10-CM

## 2017-03-02 DIAGNOSIS — T148XXA Other injury of unspecified body region, initial encounter: Secondary | ICD-10-CM

## 2017-03-02 LAB — CD4/CD8 (T-HELPER/T-SUPPRESSOR CELL)
CD4 absolute: 11 /uL — ABNORMAL LOW (ref 500–1900)
CD4%: 2 % — ABNORMAL LOW (ref 30.0–60.0)
CD8 T Cell Abs: 307 /uL (ref 230–1000)
CD8TOX: 58 % — AB (ref 15.0–40.0)
Ratio: 0.04 — ABNORMAL LOW (ref 1.0–3.0)
TOTAL LYMPHOCYTE COUNT: 530 /uL — AB (ref 1000–4000)

## 2017-03-02 LAB — CRYPTOCOCCAL ANTIGEN, CSF
CRYPTO AG: POSITIVE — AB
Cryptococcal Ag Titer: 320 — AB

## 2017-03-02 LAB — HIV-1 RNA QUANT-NO REFLEX-BLD
HIV 1 RNA Quant: 22100 copies/mL
LOG10 HIV-1 RNA: 4.344 {Log_copies}/mL

## 2017-03-02 LAB — CSF CELL COUNT WITH DIFFERENTIAL
Lymphs, CSF: 97 % — ABNORMAL HIGH (ref 40–80)
Monocyte-Macrophage-Spinal Fluid: 3 % — ABNORMAL LOW (ref 15–45)
RBC Count, CSF: 12 /mm3 — ABNORMAL HIGH
TUBE #: 3
WBC, CSF: 37 /mm3 (ref 0–5)

## 2017-03-02 LAB — GLUCOSE, CSF: Glucose, CSF: 39 mg/dL — ABNORMAL LOW (ref 40–70)

## 2017-03-02 LAB — PROTEIN, CSF: TOTAL PROTEIN, CSF: 294 mg/dL — AB (ref 15–45)

## 2017-03-02 MED ORDER — LIDOCAINE HCL (PF) 1 % IJ SOLN
5.0000 mL | Freq: Once | INTRAMUSCULAR | Status: DC
  Filled 2017-03-02: qty 5

## 2017-03-02 MED ORDER — BISACODYL 10 MG RE SUPP
10.0000 mg | Freq: Once | RECTAL | Status: AC
  Administered 2017-03-02: 10 mg via RECTAL
  Filled 2017-03-02: qty 1

## 2017-03-02 MED ORDER — LIDOCAINE HCL (PF) 1 % IJ SOLN
INTRAMUSCULAR | Status: AC
  Filled 2017-03-02: qty 5

## 2017-03-02 NOTE — Procedures (Signed)
Uncomplicated LP using fluoro at L3/4.    OP 18 cm of water.    7 cc collected for requested labs.  JWatts MD

## 2017-03-02 NOTE — Progress Notes (Signed)
Subjective:  nonverbal   Antibiotics:  Anti-infectives    Start     Dose/Rate Route Frequency Ordered Stop   02/22/17 2000  azithromycin (ZITHROMAX) tablet 1,200 mg     1,200 mg Oral Weekly 02/22/17 1812     02/22/17 2000  sulfamethoxazole-trimethoprim (BACTRIM DS,SEPTRA DS) 800-160 MG per tablet 1 tablet     1 tablet Oral Daily 02/22/17 1812     02/17/17 1400  levofloxacin (LEVAQUIN) IVPB 750 mg  Status:  Discontinued     750 mg 100 mL/hr over 90 Minutes Intravenous Every 48 hours 02/15/17 1515 02/16/17 1043   02/17/17 1000  azithromycin (ZITHROMAX) tablet 1,200 mg  Status:  Discontinued     1,200 mg Oral Every Fri 02/15/17 1615 02/16/17 1308   02/16/17 1330  ceFEPIme (MAXIPIME) 2 g in dextrose 5 % 50 mL IVPB  Status:  Discontinued     2 g 100 mL/hr over 30 Minutes Intravenous Every 24 hours 02/15/17 1515 02/16/17 1043   02/16/17 1200  fluconazole (DIFLUCAN) IVPB 400 mg     400 mg 100 mL/hr over 120 Minutes Intravenous Every 24 hours 02/16/17 1048     02/16/17 1000  fluconazole (DIFLUCAN) tablet 400 mg  Status:  Discontinued     400 mg Oral Daily 02/15/17 1615 02/16/17 1048   02/16/17 1000  sulfamethoxazole-trimethoprim (BACTRIM DS,SEPTRA DS) 800-160 MG per tablet 1 tablet  Status:  Discontinued     1 tablet Oral Daily 02/15/17 1615 02/16/17 1308   02/16/17 0200  vancomycin (VANCOCIN) 500 mg in sodium chloride 0.9 % 100 mL IVPB  Status:  Discontinued     500 mg 100 mL/hr over 60 Minutes Intravenous Every 12 hours 02/15/17 1515 02/16/17 1043   02/15/17 1315  ceFEPIme (MAXIPIME) 2 g in dextrose 5 % 50 mL IVPB     2 g 100 mL/hr over 30 Minutes Intravenous  Once 02/15/17 1307 02/15/17 1436   02/15/17 1300  levofloxacin (LEVAQUIN) IVPB 750 mg     750 mg 100 mL/hr over 90 Minutes Intravenous  Once 02/15/17 1254 02/15/17 1533   02/15/17 1300  aztreonam (AZACTAM) 2 g in dextrose 5 % 50 mL IVPB  Status:  Discontinued     2 g 100 mL/hr over 30 Minutes Intravenous  Once  02/15/17 1254 02/15/17 1307   02/15/17 1300  vancomycin (VANCOCIN) IVPB 1000 mg/200 mL premix     1,000 mg 200 mL/hr over 60 Minutes Intravenous  Once 02/15/17 1254 02/15/17 1436      Medications: Scheduled Meds: . amantadine  100 mg Oral BID WC  . azithromycin  1,200 mg Oral Weekly  . feeding supplement (ENSURE ENLIVE)  237 mL Oral BID BM  . feeding supplement (PRO-STAT SUGAR FREE 64)  30 mL Oral BID  . lidocaine (PF)  5 mL Intradermal Once  . multivitamin with minerals  1 tablet Oral Daily  . nystatin  5 mL Oral QID  . polyvinyl alcohol  1 drop Both Eyes QID  . sulfamethoxazole-trimethoprim  1 tablet Oral Daily  . tamsulosin  0.4 mg Oral Daily   Continuous Infusions: . 0.9 % NaCl with KCl 40 mEq / L 50 mL/hr (03/02/17 1209)  . fluconazole (DIFLUCAN) IV Stopped (03/02/17 1409)   PRN Meds:.acetaminophen **OR** acetaminophen, HYDROcodone-acetaminophen, polyethylene glycol, polyvinyl alcohol    Objective: Weight change:   Intake/Output Summary (Last 24 hours) at 03/02/17 1952 Last data filed at 03/02/17 0900  Gross per 24 hour  Intake  0 ml  Output              925 ml  Net             -925 ml   Blood pressure 116/76, pulse 73, temperature (!) 97.4 F (36.3 C), temperature source Oral, resp. rate 16, height 5\' 6"  (1.676 m), weight 95 lb (43.1 kg), SpO2 100 %. Temp:  [97.4 F (36.3 C)-98.3 F (36.8 C)] 97.4 F (36.3 C) (08/02 1537) Pulse Rate:  [73-93] 73 (08/02 1537) Resp:  [14-16] 16 (08/02 1537) BP: (107-116)/(73-84) 116/76 (08/02 1537) SpO2:  [100 %] 100 % (08/02 1537) Weight:  [95 lb (43.1 kg)] 95 lb (43.1 kg) (08/02 0439)  Physical Exam: General: Alert and awake,non communicative, HEENT: anicteric sclera, pupils reactive to light and accommodation, EOMI CVS regular rate, normal r,  no murmur rubs or gallops Chest: clear to auscultation bilaterally, no wheezing, rales or rhonchi Abdomen: soft nontender, nondistended, normal bowel sounds, Neuro:  contractures  CBC:  CBC Latest Ref Rng & Units 03/01/2017 02/26/2017 02/25/2017  WBC 4.0 - 10.5 K/uL 2.5(L) 2.3(L) 2.3(L)  Hemoglobin 13.0 - 17.0 g/dL 10.8(L) 10.2(L) 9.9(L)  Hematocrit 39.0 - 52.0 % 33.9(L) 31.5(L) 30.7(L)  Platelets 150 - 400 K/uL 146(L) 135(L) 129(L)      BMET  Recent Labs  03/01/17 0325  NA 132*  K 3.9  CL 104  CO2 21*  GLUCOSE 85  BUN 12  CREATININE 0.48*  CALCIUM 8.8*     Liver Panel   Recent Labs  03/01/17 0325  PROT 8.8*  ALBUMIN 2.6*  AST 26  ALT 28  ALKPHOS 115  BILITOT 0.5       Sedimentation Rate No results for input(s): ESRSEDRATE in the last 72 hours. C-Reactive Protein No results for input(s): CRP in the last 72 hours.  Micro Results: Recent Results (from the past 720 hour(s))  Culture, blood (Routine x 2)     Status: None   Collection Time: 02/15/17 12:45 PM  Result Value Ref Range Status   Specimen Description BLOOD LEFT ANTECUBITAL  Final   Special Requests   Final    BOTTLES DRAWN AEROBIC ONLY Blood Culture adequate volume   Culture NO GROWTH 5 DAYS  Final   Report Status 02/20/2017 FINAL  Final  Culture, blood (Routine x 2)     Status: None   Collection Time: 02/15/17 12:51 PM  Result Value Ref Range Status   Specimen Description BLOOD RIGHT ANTECUBITAL  Final   Special Requests IN PEDIATRIC BOTTLE Blood Culture adequate volume  Final   Culture NO GROWTH 5 DAYS  Final   Report Status 02/20/2017 FINAL  Final  CSF culture     Status: None   Collection Time: 02/16/17  1:29 PM  Result Value Ref Range Status   Specimen Description CSF  Final   Special Requests NONE  Final   Gram Stain   Final    WBC PRESENT, PREDOMINANTLY PMN YEAST CYTOSPIN SMEAR CRITICAL RESULT CALLED TO, READ BACK BY AND VERIFIED WITHCandie Chroman Pena AT [id number] ON [id number] BY SJW    Culture NO GROWTH 3 DAYS  Final   Report Status 02/19/2017 FINAL  Final  Fungus Culture With Stain     Status: None (Preliminary result)   Collection Time: 02/16/17   1:29 PM  Result Value Ref Range Status   Fungus Stain Final report  Final    Comment: (NOTE) Performed At: [facility] [address] [place], [place] [id number]  William Pena WR:[id number]    Fungus (Mycology) Culture PENDING  Incomplete   Fungal Source CSF  Final  Fungus Culture Result     Status: None   Collection Time: 02/16/17  1:29 PM  Result Value Ref Range Status   Result 1 Comment  Final    Comment: (NOTE) KOH/Calcofluor preparation:  no fungus observed. Performed At: [facility] [place], [place] [id number] William Pena OZ:[id number]   MRSA PCR Screening     Status: None   Collection Time: 02/16/17  2:44 PM  Result Value Ref Range Status   MRSA by PCR NEGATIVE NEGATIVE Final    Comment:        The GeneXpert MRSA Assay (FDA approved for NASAL specimens only), is one component of a comprehensive MRSA colonization surveillance program. It is not intended to diagnose MRSA infection nor to guide or monitor treatment for MRSA infections.   CSF culture     Status: None   Collection Time: 02/18/17  2:00 PM  Result Value Ref Range Status   Specimen Description CSF  Final   Special Requests TUBE 2  Final   Gram Stain   Final    WBC PRESENT,BOTH PMN AND MONONUCLEAR YEAST CYTOSPIN SMEAR CRITICAL RESULT CALLED TO, READ BACK BY AND VERIFIED WITH: A. RODRIGUEZ Pena, AT 1533 02/18/17 BY William Pena    Culture NO GROWTH 3 DAYS  Final   Report Status 02/21/2017 FINAL  Final  Culture, fungus without smear     Status: None (Preliminary result)   Collection Time: 02/18/17  2:00 PM  Result Value Ref Range Status   Specimen Description CSF  Final   Special Requests TUBE 2  Final   Culture NO FUNGUS ISOLATED AFTER 7 DAYS  Final   Report Status PENDING  Incomplete  CSF culture     Status: None   Collection Time: 02/22/17 11:43 AM  Result Value Ref Range Status   Specimen Description CSF  Final   Special Requests TUBE 2  Final    Gram Stain   Final    CYTOSPIN SMEAR WBC PRESENT, PREDOMINANTLY MONONUCLEAR YEAST CORRECTED RESULTS PREVIOUSLY REPORTED AS: NO ORGANISMS SEEN CORRECTED RESULTS CALLED TO: Pena William Pena [id number]    Culture NO GROWTH 3 DAYS  Final   Report Status 02/25/2017 FINAL  Final  CSF culture     Status: None (Preliminary result)   Collection Time: 03/02/17  8:51 AM  Result Value Ref Range Status   Specimen Description CSF  Final   Special Requests NONE  Final   Gram Stain   Final    CYTOSPIN SMEAR WBC PRESENT, PREDOMINANTLY MONONUCLEAR ENCAPSULATED YEAST CRITICAL RESULT CALLED TO, READ BACK BY AND VERIFIED WITH: William Pena 10:05 03/02/17 ([email or web address])    Culture PENDING  Incomplete   Report Status PENDING  Incomplete    Studies/Results: Dg Fluoro Guide Lumbar Puncture  Result Date: 03/02/2017 William Silvius, William Pena     11/02/100  7:25 AM Uncomplicated LP using fluoro at L3/4.  OP 18 cm of water.  7 cc collected for requested labs. William Pena     Assessment/Plan:  INTERVAL HISTORY:  Opening pressure is normal today   Principal Problem:   Sepsis ([place]) Active Problems:   AIDS (acquired immune deficiency syndrome) ([facility])   Encephalopathy acute   Cryptococcal meningoencephalitis ([facility])   HIV (human immunodeficiency virus infection) ([place])   AKI (acute kidney injury) ([place])   Protein-calorie malnutrition, severe  Anemia of chronic disease   Acute encephalopathy   Goals of care, counseling/discussion   Palliative care by specialist   Leukopenia   Deep tissue injury bilateral heels   Wound of buttock   Hypokalemia   Encephalopathy    William Pena is a 31 y.o. male with  HIV/AIDS mx medical problems, perirectal abscess, cryptococcal pneumonia and meningoencephalitis requiring IVD, still with high opening pressures this admission, NEVER started on ARV due to fear for uncontrolled IRIS and ICP absent normalized ICP and absent mechanical means of controlling ICP (ie with  shunt)  #1 Cryptococcal meningoencephalitis:  Opening pressure is normal today   Continue fluconazole  See below re the ARV  #2 Encephalopathy: IDEALLY I WOULD LIKE TO START ARV IF we are to be aggressive as it is possible that the HIV is directly affecting CNS and causing encephalopathy.  Certainly his high ICP could be playing a role and he MAY have suffered permanent Neuro insults  #3 HIV/AIDS: IF we are going to pursue aggressive care he DESPERATELY needs ARV to be intiated. I remain VERY concerned for IRIS. I would feel MUCH more comfortable knowing that his ICP were controlled and FRANKLY would feel more comfortabble if he had shunt in place to ensure control of ICP which MAY DRAMATICALLY WORSEN with IRIS  I have further discussed this with my partners Dr. [name] and Dr. Baxter Flattery and they agree with me that IF we are going to make another move to make him better from medical standpoint ARV is critical.  I called mother William Pena tonight at 750pm but no one picked up. I will call her again tomorrow and if she agrees I will start ARV.  #4 Goals of care: see palliative care note and greatly appreciate their help here.   LOS: 15 days   William Pena 03/02/2017, 7:52 PM

## 2017-03-02 NOTE — Progress Notes (Addendum)
CRITICAL VALUE STICKER  CRITICAL VALUE: CSF- WBC 37, protein 294, + cryptococcosis  RECEIVER (on-site recipient of call): Cross Jorge  DATE & TIME NOTIFIED: 03/02/17 9:53 am   MD NOTIFIED: Wyline Copas

## 2017-03-02 NOTE — Progress Notes (Signed)
PROGRESS NOTE    William Pena  TJQ:[id number] DOB: March 20, 1986 DOA: 02/15/2017 PCP: [name], MD    Brief Narrative:  31 y.o. 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 Initial impression from ID was that he had relapsed cryptococcal meningitis.  Assessment & Plan:   Principal Problem:   Sepsis ([place]) Active Problems:   AIDS (acquired immune deficiency syndrome) (HCC)   Encephalopathy acute   Cryptococcal meningoencephalitis (HCC)   HIV (human immunodeficiency virus infection) ([place])   AKI (acute kidney injury) ([place])   Protein-calorie malnutrition, severe   Anemia of chronic disease   Acute encephalopathy   Goals of care, counseling/discussion   Palliative care by specialist   Leukopenia   Deep tissue injury bilateral heels   Wound of buttock   Hypokalemia   Encephalopathy  Principal Problem:   Sepsis ([name] to cryptococcal meningeal encephalitis Being treated for relapsed cryptococcal meningitis with fluconazole. Will need 8 weeks of therapy per ID (started 02/16/17 with stopping date 04/13/17), and will likely need 200 mg of fluconazole as oral therapy for another 10 months to equal 12 months of treatment. - LP done today with WBC of 37 and crypto in fluid aspirate. Discussed with ID with recs to continue current antimicrobials.  Active Problems:   AIDS (acquired immune deficiency syndrome) (HCC)/HIV infection Unable to start antiretroviral therapy at this time due to the risk of immune reconstitution inflammatory syndrome per ID. Palliative care consulted with goals of care meeting held 02/22/17, with full scope of care recommended due to differing opinions from family regarding how to proceed. Lengthy discussion held with palliative care  regarding treatment planning for this patient 02/23/17.  Guardianship hearing was held 02/28/17. - Per palliative care, wishes are for continued full treatment and Full Code. If patient's condition worsens and/or patient requires future hospitalization, then family may consider more palliative approach at that time - Patient is continued on Bactrim daily and azithromycin weekly to prevent opportunistic infections. - ID to discuss with family re: possibility of restarting anti-retrovirals with understanding that significant risk would be involved - Prognosis remains poor. Appreciate in put by Palliative Care    Encephalopathy acute Appears to be related to cryptococcal meningeal encephalitis.  - No significant change in condition was noted - Patient remains non-verbal at this time    AKI (acute kidney injury) ([place]) - Suspect presenting AKI secondary to dehydration - Labs were reviewed. Renal function has resolved    Protein-calorie malnutrition, severe - Patient is continued on supplements.  - Body mass index is 20.3 kg/m.  - Stable at present    Anemia of chronic disease/thrombocytopenia/Leukopenia - Hematology following.  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  - Continue to transfuse as needed. - Repeat CBC in AM    3 full thickness wounds to bilat buttocks; 2X.5X.5cm, 1X1X1cm, 5.5X3X.2cm - Continue wound care per WOC. Per her notes: "Pt had I&D of buttock abscesses in May with the surgical team, according to the EMR. Thesewounds were present on admission but are NOT pressure injuries." - remains stable at present    Deep tissue injuries bilateral heels - Wound care per wound care team.  - continue with floating heels.    Hypokalemia - Potassium has been added to IV fluids - Remains stable at present. - Will repeat bmet  in AM    Hyponatremia - Mild, monitor. -Currently stable  DVT prophylaxis: SCD's Code Status: Full Family Communication: Pt  in room, patient's mother at bedside Disposition Plan: Uncertain at this time  Consultants:   ID  Neurology  Procedures:   Intermiitent LP's  Antimicrobials: Anti-infectives    Start     Dose/Rate Route Frequency Ordered Stop   02/22/17 2000  azithromycin (ZITHROMAX) tablet 1,200 mg     1,200 mg Oral Weekly 02/22/17 1812     02/22/17 2000  sulfamethoxazole-trimethoprim (BACTRIM DS,SEPTRA DS) 800-160 MG per tablet 1 tablet     1 tablet Oral Daily 02/22/17 1812     02/17/17 1400  levofloxacin (LEVAQUIN) IVPB 750 mg  Status:  Discontinued     750 mg 100 mL/hr over 90 Minutes Intravenous Every 48 hours 02/15/17 1515 02/16/17 1043   02/17/17 1000  azithromycin (ZITHROMAX) tablet 1,200 mg  Status:  Discontinued     1,200 mg Oral Every Fri 02/15/17 1615 02/16/17 1308   02/16/17 1330  ceFEPIme (MAXIPIME) 2 g in dextrose 5 % 50 mL IVPB  Status:  Discontinued     2 g 100 mL/hr over 30 Minutes Intravenous Every 24 hours 02/15/17 1515 02/16/17 1043   02/16/17 1200  fluconazole (DIFLUCAN) IVPB 400 mg     400 mg 100 mL/hr over 120 Minutes Intravenous Every 24 hours 02/16/17 1048     02/16/17 1000  fluconazole (DIFLUCAN) tablet 400 mg  Status:  Discontinued     400 mg Oral Daily 02/15/17 1615 02/16/17 1048   02/16/17 1000  sulfamethoxazole-trimethoprim (BACTRIM DS,SEPTRA DS) 800-160 MG per tablet 1 tablet  Status:  Discontinued     1 tablet Oral Daily 02/15/17 1615 02/16/17 1308   02/16/17 0200  vancomycin (VANCOCIN) 500 mg in sodium chloride 0.9 % 100 mL IVPB  Status:  Discontinued     500 mg 100 mL/hr over 60 Minutes Intravenous Every 12 hours 02/15/17 1515 02/16/17 1043   02/15/17 1315  ceFEPIme (MAXIPIME) 2 g in dextrose 5 % 50 mL IVPB     2 g 100 mL/hr over 30 Minutes Intravenous  Once 02/15/17 1307 02/15/17 1436   02/15/17 1300  levofloxacin (LEVAQUIN) IVPB 750 mg     750 mg 100 mL/hr over 90 Minutes Intravenous  Once 02/15/17 1254 02/15/17 1533   02/15/17 1300  aztreonam (AZACTAM)  2 g in dextrose 5 % 50 mL IVPB  Status:  Discontinued     2 g 100 mL/hr over 30 Minutes Intravenous  Once 02/15/17 1254 02/15/17 1307   02/15/17 1300  vancomycin (VANCOCIN) IVPB 1000 mg/200 mL premix     1,000 mg 200 mL/hr over 60 Minutes Intravenous  Once 02/15/17 1254 02/15/17 1436      Subjective: Awake, remains non-verbal  Objective: Vitals:   03/01/17 1637 03/01/17 2157 03/02/17 0439 03/02/17 0651  BP: (!) 126/95 110/84  107/73  Pulse: 68 80  93  Resp: 16 15  14   Temp: 98.2 F (36.8 C) 98.3 F (36.8 C)  98.1 F (36.7 C)  TempSrc: Oral   Oral  SpO2: 100% 100%  100%  Weight:   43.1 kg (95 lb)   Height:        Intake/Output Summary (Last 24 hours) at 03/02/17 1416 Last data filed at 03/02/17 0900  Gross per 24 hour  Intake                0 ml  Output  1725 ml  Net            -1725 ml   Filed Weights   02/16/17 1407 03/02/17 0439  Weight: 57.1 kg (125 lb 12.8 oz) 43.1 kg (95 lb)    Examination: General exam: Awake, laying in bed, in nad Respiratory system: Normal respiratory effort, no wheezing Cardiovascular system: regular rate, s1, s2 Gastrointestinal system: Soft, nondistended, positive BS Central nervous system: CN2-12 grossly intact, strength intact Extremities: Perfused, no clubbing Skin: Normal skin turgor, no notable skin lesions seen Psychiatry:unable to assess given patient's current mental state   Data Reviewed: I have personally reviewed following labs and imaging studies  CBC:  Recent Labs Lab 02/24/17 0333 02/25/17 0306 02/26/17 0300 03/01/17 0325  WBC 1.9* 2.3* 2.3* 2.5*  NEUTROABS 1.2* 1.1* 1.3*  --   HGB 11.5* 9.9* 10.2* 10.8*  HCT 34.8* 30.7* 31.5* 33.9*  MCV 85.1 85.8 86.8 85.8  PLT 136* 129* 135* 426*   Basic Metabolic Panel:  Recent Labs Lab 02/24/17 0333 02/26/17 0300 03/01/17 0325  NA 132* 133* 132*  K 3.7 4.0 3.9  CL 106 107 104  CO2 21* 21* 21*  GLUCOSE 82 85 85  BUN 11 12 12   CREATININE 0.58* 0.53*  0.48*  CALCIUM 8.4* 8.5* 8.8*   GFR: Estimated Creatinine Clearance: 81.6 mL/min (A) (by C-G formula based on SCr of 0.48 mg/dL (L)). Liver Function Tests:  Recent Labs Lab 03/01/17 0325  AST 26  ALT 28  ALKPHOS 115  BILITOT 0.5  PROT 8.8*  ALBUMIN 2.6*   No results for input(s): LIPASE, AMYLASE in the last 168 hours. No results for input(s): AMMONIA in the last 168 hours. Coagulation Profile: No results for input(s): INR, PROTIME in the last 168 hours. Cardiac Enzymes: No results for input(s): CKTOTAL, CKMB, CKMBINDEX, TROPONINI in the last 168 hours. BNP (last 3 results) No results for input(s): PROBNP in the last 8760 hours. HbA1C: No results for input(s): HGBA1C in the last 72 hours. CBG: No results for input(s): GLUCAP in the last 168 hours. Lipid Profile: No results for input(s): CHOL, HDL, LDLCALC, TRIG, CHOLHDL, LDLDIRECT in the last 72 hours. Thyroid Function Tests: No results for input(s): TSH, T4TOTAL, FREET4, T3FREE, THYROIDAB in the last 72 hours. Anemia Panel: No results for input(s): VITAMINB12, FOLATE, FERRITIN, TIBC, IRON, RETICCTPCT in the last 72 hours. Sepsis Labs: No results for input(s): PROCALCITON, LATICACIDVEN in the last 168 hours.  Recent Results (from the past 240 hour(s))  CSF culture     Status: None   Collection Time: 02/22/17 11:43 AM  Result Value Ref Range Status   Specimen Description CSF  Final   Special Requests TUBE 2  Final   Gram Stain   Final    CYTOSPIN SMEAR WBC PRESENT, PREDOMINANTLY MONONUCLEAR YEAST CORRECTED RESULTS PREVIOUSLY REPORTED AS: NO ORGANISMS SEEN CORRECTED RESULTS CALLED TO: RN Gilman Buttner [id number] [phone]    Culture NO GROWTH 3 DAYS  Final   Report Status 02/25/2017 FINAL  Final  CSF culture     Status: None (Preliminary result)   Collection Time: 03/02/17  8:51 AM  Result Value Ref Range Status   Specimen Description CSF  Final   Special Requests NONE  Final   Gram Stain   Final    CYTOSPIN  SMEAR WBC PRESENT, PREDOMINANTLY MONONUCLEAR ENCAPSULATED YEAST CRITICAL RESULT CALLED TO, READ BACK BY AND VERIFIED WITH: S. Ward RN 10:05 03/02/17 ([email or web address])    Culture PENDING  Incomplete   Report Status  PENDING  Incomplete     Radiology Studies: Dg Fluoro Guide Lumbar Puncture  Result Date: 03/02/2017 Gilford Silvius, MD     10/31/3242  0:10 AM Uncomplicated LP using fluoro at L3/4.  OP 18 cm of water.  7 cc collected for requested labs. JWatts MD   Scheduled Meds: . amantadine  100 mg Oral BID WC  . azithromycin  1,200 mg Oral Weekly  . feeding supplement (ENSURE ENLIVE)  237 mL Oral BID BM  . feeding supplement (PRO-STAT SUGAR FREE 64)  30 mL Oral BID  . lidocaine (PF)  5 mL Intradermal Once  . lidocaine (PF)      . multivitamin with minerals  1 tablet Oral Daily  . nystatin  5 mL Oral QID  . polyvinyl alcohol  1 drop Both Eyes QID  . sulfamethoxazole-trimethoprim  1 tablet Oral Daily  . tamsulosin  0.4 mg Oral Daily   Continuous Infusions: . 0.9 % NaCl with KCl 40 mEq / L 50 mL/hr (03/02/17 1209)  . fluconazole (DIFLUCAN) IV Stopped (03/02/17 1409)     LOS: 15 days   Deisy Ozbun, Orpah Melter, MD [facility] Pager [phone]  If 7PM-7AM, please contact night-coverage [email or web address] Password [email or web address] 03/02/2017, 2:16 PM

## 2017-03-02 NOTE — Progress Notes (Signed)
Daily Progress Note   Patient Name: William Pena       Date: 03/02/2017 DOB: 1985/11/11  Age: 31 y.o. MRN#: [id number] Attending Physician: Donne Hazel, MD Primary Care Physician: [name], MD Admit Date: 02/15/2017  Reason for Consultation/Follow-up: Establishing goals of care, Interfamily conflict and Psychosocial/spiritual support  Subjective: William Pena is unchanged from when I last saw him. LP completed this morning (noted report of CSF: WBC 37, protein 294, + cryptococcosis). He appears comfortable in bed with no overt signs of discomfort. No family at bedside. I spoke with his mother over the phone, please see discussion in Assessment section below.   Length of Stay: 15  Current Medications: Scheduled Meds:  . amantadine  100 mg Oral BID WC  . azithromycin  1,200 mg Oral Weekly  . feeding supplement (ENSURE ENLIVE)  237 mL Oral BID BM  . feeding supplement (PRO-STAT SUGAR FREE 64)  30 mL Oral BID  . lidocaine (PF)  5 mL Intradermal Once  . lidocaine (PF)      . multivitamin with minerals  1 tablet Oral Daily  . nystatin  5 mL Oral QID  . polyvinyl alcohol  1 drop Both Eyes QID  . sulfamethoxazole-trimethoprim  1 tablet Oral Daily  . tamsulosin  0.4 mg Oral Daily    Continuous Infusions: . 0.9 % NaCl with KCl 40 mEq / L 50 mL/hr (03/02/17 1209)  . fluconazole (DIFLUCAN) IV Stopped (03/02/17 1409)    PRN Meds: acetaminophen **OR** acetaminophen, HYDROcodone-acetaminophen, polyethylene glycol, polyvinyl alcohol  Physical Exam   Constitution: Frail man lying in bed. HENT:  Head: Normocephalic and atraumatic.  Mucous membranes moist. Nose: Nose normal.  Eyes:  Right pupil slightly larger than left. Will track me but seems to be staring through me rather than looking at me.  Neck: Normal range of motion. Neck supple.   Pulmonary/Chest: Effort normal. Breathing comfortably in bed, no signs of distress.  Musculoskeletal:  UTA, arms contracted. Right fingers with rare spastic/jerks, no movement seen on RLE. LUE with occasional non-purposeful movement, no movement observed on LLE. Significant muscle wasting.  Neurological:  Pt is awake. His eyes are open and he will track me, but seems to be staring through me. Not speaking or following commands. Not blinking on command consistently. He does blink to threat. Skin: Skin is warm and dry.  Extensive tatoos  Psychiatric:  Calm in bed, otherwise non-interactive         Vital Signs: BP 107/73 (BP Location: Right Arm)   Pulse 93   Temp 98.1 F (36.7 C) (Oral)   Resp 14   Ht '5\' 6"'  (1.676 m)   Wt 43.1 kg (95 lb)   SpO2 100%   BMI 15.33 kg/m  SpO2: SpO2: 100 % O2 Device: O2 Device: Not Delivered O2 Flow Rate: O2 Flow Rate (L/min): 2 L/min  Intake/output summary:   Intake/Output Summary (Last 24 hours) at 03/02/17 1317 Last data filed at 03/02/17 0900  Gross per 24 hour  Intake                0 ml  Output             1725 ml  Net            -1725 ml   LBM: Last BM Date: 02/23/17 Baseline Weight: Weight: 57.1 kg (125 lb 12.8 oz) Most recent weight: Weight: 43.1 kg (95 lb)  Palliative Assessment/Data: PPS 30%   Flowsheet Rows     Most Recent Value  Intake Tab  Referral Department  Hospitalist  Unit at Time of Referral  ICU  Palliative Care Primary Diagnosis  Sepsis/Infectious Disease  Date Notified  02/21/17  Palliative Care Type  New Palliative care  Reason for referral  Clarify Goals of Care  Date of Admission  02/15/17  # of days IP prior to Palliative referral  6  Clinical Assessment  Psychosocial & Spiritual Assessment  Palliative Care Outcomes      Patient Active Problem List   Diagnosis Date Noted  . Hypokalemia 02/23/2017  . Encephalopathy   . Leukopenia 02/22/2017  . Deep tissue injury bilateral heels 02/22/2017  . Wound  of buttock 02/22/2017  . Goals of care, counseling/discussion   . Palliative care by specialist   . Sepsis ([place]) 02/16/2017  . Acute encephalopathy 02/15/2017  . Acute urinary retention 02/08/2017  . Genital herpes 02/08/2017  . Anemia of chronic disease 02/08/2017  . Constipation 02/08/2017  . Protein-calorie malnutrition, severe 01/28/2017  . Pressure injury of skin 01/16/2017  . AKI (acute kidney injury) ([place])   . HIV (human immunodeficiency virus infection) ([place])   . Meningitis   . 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 Acute blood loss anemia   . Elevated intracranial pressure   . HIV disease ([place])   . Lymphadenopathy of head and neck   . Rectal abscess   . Cryptococcal meningoencephalitis ([place])   . 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 Cavitary pneumonia   . Diffuse lymphadenopathy   . Drug rash   . Hypomagnesemia 12/23/2016  . Encephalopathy acute 12/23/2016  . SIRS (systemic inflammatory response syndrome) ([place]) 12/23/2016  . Acute respiratory failure ([place]) 12/23/2016  . Pulmonary infiltrates   . Perirectal abscess 12/21/2016  . Hyponatremia 12/21/2016  . Normocytic anemia 12/21/2016  . Elevated LFTs 12/21/2016  . AIDS (acquired immune deficiency syndrome) ([place]) 07/07/2016  . Chronic pain 06/20/2011    Palliative Care Assessment & Plan   HPI: 31 y.o. 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 He was recently here 6/16-0/7 with a complicated course due to disseminated cryptococcal infection and cryptococcal meningoencephalitis. He now presents from SNF with worsening encephalopathy and decreased oral intake. He was admitted on 02/15/2017. On admission he was was found to be septic in the setting of recurrent cryptococcal meningitis; ID following. He also had  increased ICP for which he is having serial LPs to monitor (VP shunt to be considered if ICPs climb again); Neurology consulted and advised. 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 Palliative consulted to assist family in clarifying goals of care given the patient's recurrent health issues and now significant failure to thrive.   Assessment: I met with [name] on 8/1 after the guardianship hearing. She reports that she did receive guardianship, however she forgot to bring a copy of the paperwork. I explained that with court appointed guardianship she is now the sole Media planner for [employer]. That said, I also recognized her need for  family harmony and peace in decision making. [name] remains conflicted as she sees Dalbert' decline, debility, and feels strongly he would not choose this life, however she is not yet ready to formalize the decision for comfort measures given his current stability (namely the resolution of sepsis and a clear plan for evaluating ICP, long term abx, and potential to re-start ARVs). At this point, she wants to continue the current full scope approach to care. If Omarie should acutely worsen or later have an issue that brings him back to the hospital, she will re-assess at that point to consider a more comfort focused approach. We also talked at length about code status; she was going to seek out guidance from her pastor and continue to pray on it.   I followed-up again today to discuss code status, per her request. [name] was not able to come to the hospital today, but we did speak over the phone. After thinking on it, she would like Omarrion' code status to change to DNR. I talked her through that would mean, at the point where his lungs and/or heart stopped, no CPR, defibrillation, or intubation. She was in full agreement. Unfortunately, I have not seen the guardianship paperwork, nor have I  been able to confirm guardianship appointment with the patient's lawyer (two messages left with no call back yet). Given the patient's father's clear want of Full Code status, I am not comfortable changing the code status until the paperwork is available and can be reviewed. [name] plans to bring it in tomorrow at my request given the disagreement between her and Broadus John.   Recommendations/Plan:  Full scope care, plan for d/c to rehab with Palliative (NOT HOSPICE) once medically ready  [name] is expressing the desire for DNR code status, however we need the Guardianship paperwork reviewed and on-file given the strong disagreement between parents; she plans to bring it in tomorrow and I will have Palliative f/u on code status  Goals of Care and Additional Recommendations:  Limitations on Scope of Treatment: Full Scope Treatment  Code Status:  Full code  Prognosis:   Unable to determine. Very poor prognosis overall.   Discharge Planning:  [place] for rehab with Palliative care service follow-up  Care plan was discussed with pt's mother and care nurse  Thank you for allowing the Palliative Medicine Team to assist in the care of this patient.  Total time: 25 minutes    Greater than 50%  of this time was spent counseling and coordinating care related to the above assessment and plan.  Charlynn Court, NP Palliative Medicine Team [phone] pager (7a-5p) Team Phone # [phone]

## 2017-03-03 DIAGNOSIS — G9340 Encephalopathy, unspecified: Secondary | ICD-10-CM

## 2017-03-03 DIAGNOSIS — Z7189 Other specified counseling: Secondary | ICD-10-CM

## 2017-03-03 MED ORDER — EMTRICITABINE-TENOFOVIR AF 200-25 MG PO TABS
1.0000 | ORAL_TABLET | Freq: Every day | ORAL | Status: DC
  Administered 2017-03-03 – 2017-03-16 (×13): 1 via ORAL
  Filled 2017-03-03 (×14): qty 1

## 2017-03-03 MED ORDER — DOLUTEGRAVIR SODIUM 50 MG PO TABS
50.0000 mg | ORAL_TABLET | Freq: Every day | ORAL | Status: DC
  Administered 2017-03-03 – 2017-03-16 (×13): 50 mg via ORAL
  Filled 2017-03-03 (×15): qty 1

## 2017-03-03 MED ORDER — FLUCONAZOLE 100 MG PO TABS
400.0000 mg | ORAL_TABLET | Freq: Every day | ORAL | Status: DC
  Administered 2017-03-03 – 2017-03-05 (×3): 400 mg via ORAL
  Filled 2017-03-03 (×3): qty 4

## 2017-03-03 NOTE — Progress Notes (Signed)
Daily Progress Note   Patient Name: William Pena       Date: 03/03/2017 DOB: 03-06-1986  Age: 31 y.o. MRN#: [id number] Attending Physician: Donne Hazel, MD Primary Care Physician: [name], MD Admit Date: 02/15/2017  Reason for Consultation/Follow-up: Establishing goals of care and Family-Clinician negotiation  Subjective: Patient in bed, awake. Tracks to family members. Nonverbal. His mom states he eats when she feeds him, but will not eat when anyone else feeds him.  Met at bedside with patient's mother and brother. William Pena provided guardianship paperwork and confirmed her desire to change patient's code status to Do Not Resuscitate. William Pena verbalized this meant if patient were to experience cardiac arrest she would not want him resuscitated or intubated. She does however, want to proceed with all aggressive care up to that point.  I was also present as Dr. Tommy Medal reviewed options for continued aggressive medical care- start ARV with monitoring for IRIS which patient is at high risk for; continue to tx ICP with serial LP's; pt not likely to receive shunt by neurosurgery. There is concern that patient has permanent neuro damage.  Review of Systems  Unable to perform ROS: Acuity of condition    Length of Stay: 16  Current Medications: Scheduled Meds:  . amantadine  100 mg Oral BID WC  . azithromycin  1,200 mg Oral Weekly  . feeding supplement (ENSURE ENLIVE)  237 mL Oral BID BM  . feeding supplement (PRO-STAT SUGAR FREE 64)  30 mL Oral BID  . fluconazole  400 mg Oral Daily  . lidocaine (PF)  5 mL Intradermal Once  . multivitamin with minerals  1 tablet Oral Daily  . nystatin  5 mL Oral QID  . polyvinyl alcohol  1 drop Both Eyes QID  . sulfamethoxazole-trimethoprim  1 tablet  Oral Daily  . tamsulosin  0.4 mg Oral Daily    Continuous Infusions: . 0.9 % NaCl with KCl 40 mEq / L 50 mL/hr (03/02/17 1209)    PRN Meds: acetaminophen **OR** acetaminophen, HYDROcodone-acetaminophen, polyethylene glycol, polyvinyl alcohol  Physical Exam  Constitutional:  Cachetic   Musculoskeletal:  Upper extremities contracted  Neurological:  Awake, alert, tracks to people in the room  Skin: Skin is warm and dry.            Vital Signs: BP  123/75 (BP Location: Right Arm)   Pulse 72   Temp (!) 97.4 F (36.3 C) (Oral)   Resp 16   Ht 5' 6" (1.676 m)   Wt 43.1 kg (95 lb)   SpO2 100%   BMI 15.33 kg/m  SpO2: SpO2: 100 % O2 Device: O2 Device: Not Delivered O2 Flow Rate: O2 Flow Rate (L/min): 2 L/min  Intake/output summary:  Intake/Output Summary (Last 24 hours) at 03/03/17 1235 Last data filed at 03/03/17 1011  Gross per 24 hour  Intake              240 ml  Output              500 ml  Net             -260 ml   LBM: Last BM Date: 02/23/17 Baseline Weight: Weight: 57.1 kg (125 lb 12.8 oz) Most recent weight: Weight: 43.1 kg (95 lb)       Palliative Assessment/Data: PPS: 20%   Flowsheet Rows     Most Recent Value  Intake Tab  Referral Department  Hospitalist  Unit at Time of Referral  ICU  Palliative Care Primary Diagnosis  Sepsis/Infectious Disease  Date Notified  02/21/17  Palliative Care Type  New Palliative care  Reason for referral  Clarify Goals of Care  Date of Admission  02/15/17  # of days IP prior to Palliative referral  6  Clinical Assessment  Psychosocial & Spiritual Assessment  Palliative Care Outcomes      Patient Active Problem List   Diagnosis Date Noted  . Acute renal failure ([place])   . Hypokalemia 02/23/2017  . Encephalitis   . Leukopenia 02/22/2017  . Deep tissue injury bilateral heels 02/22/2017  . Wound of buttock 02/22/2017  . Goals of care, counseling/discussion   . Palliative care by specialist   . Sepsis ([place]) 02/16/2017   . Acute encephalopathy 02/15/2017  . Acute urinary retention 02/08/2017  . Genital herpes 02/08/2017  . Anemia of chronic disease 02/08/2017  . Constipation 02/08/2017  . Protein-calorie malnutrition, severe 01/28/2017  . Pressure injury of skin 01/16/2017  . AKI (acute kidney injury) ([place])   . HIV (human immunodeficiency virus infection) ([place])   . Meningitis   . 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 Acute blood loss anemia   . Elevated intracranial pressure   . HIV disease ([place])   . Lymphadenopathy of head and neck   . Rectal abscess   . 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 Cavitary pneumonia   . Diffuse lymphadenopathy   . Drug rash   . Hypomagnesemia 12/23/2016  . Encephalopathy acute 12/23/2016  . SIRS (systemic inflammatory response syndrome) ([place]) 12/23/2016  . Acute respiratory failure ([place]) 12/23/2016  . Pulmonary infiltrates   . Perirectal abscess 12/21/2016  . Hyponatremia 12/21/2016  . Normocytic anemia 12/21/2016  . Elevated LFTs 12/21/2016  . AIDS (acquired immune deficiency syndrome) ([place]) 07/07/2016  . 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 He was recently here 8/52-7/7 with a complicated course due to disseminated cryptococcal infection and cryptococcal meningoencephalitis. He now presents from SNF with worsening encephalopathy and decreased oral intake. He was admitted on 02/15/2017. On admission he was was found to be septic  in the setting of recurrent cryptococcal meningitis; ID following. He also had increased ICP for which he is having serial LPs to monitor (VP shunt to be considered if ICPs climb again); Neurology  consulted and advised. 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 Palliative consulted to assist family in clarifying goals of care given the patient's recurrent health issues and now significant failure to thrive.   Assessment/Recommendations/Plan   DNR  Continue current aggressive medical care  William Pena is considering comfort care, if patient's status acutely worsens or does not improve with ARV's as discussed by Dr. Tommy Medal  PMT will continue to follow peripherally and intervene if patient's status changes  Goals of Care and Additional Recommendations:  Limitations on Scope of Treatment: Full Scope Treatment  Code Status:  DNR  Prognosis:   Unable to determine  Discharge Planning:  [place] for rehab with Palliative care service follow-up  Care plan was discussed with patient's mother- William Pena.  Thank you for allowing the Palliative Medicine Team to assist in the care of this patient.   Total time: 35 minutes  Greater than 50%  of this time was spent counseling and coordinating care related to the above assessment and plan.  Mariana Kaufman, AGNP-C Palliative Medicine   Please contact Palliative Medicine Team phone at [phone] for questions and concerns.

## 2017-03-03 NOTE — Progress Notes (Signed)
Subjective:  nonverbal   Antibiotics:  Anti-infectives    Start     Dose/Rate Route Frequency Ordered Stop   03/03/17 1430  emtricitabine-tenofovir AF (DESCOVY) 200-25 MG per tablet 1 tablet     1 tablet Oral Daily 03/03/17 1324     03/03/17 1330  dolutegravir (TIVICAY) tablet 50 mg     50 mg Oral Daily 03/03/17 1324     03/03/17 1200  fluconazole (DIFLUCAN) tablet 400 mg     400 mg Oral Daily 03/03/17 1133     02/22/17 2000  azithromycin (ZITHROMAX) tablet 1,200 mg     1,200 mg Oral Weekly 02/22/17 1812     02/22/17 2000  sulfamethoxazole-trimethoprim (BACTRIM DS,SEPTRA DS) 800-160 MG per tablet 1 tablet     1 tablet Oral Daily 02/22/17 1812     02/17/17 1400  levofloxacin (LEVAQUIN) IVPB 750 mg  Status:  Discontinued     750 mg 100 mL/hr over 90 Minutes Intravenous Every 48 hours 02/15/17 1515 02/16/17 1043   02/17/17 1000  azithromycin (ZITHROMAX) tablet 1,200 mg  Status:  Discontinued     1,200 mg Oral Every Fri 02/15/17 1615 02/16/17 1308   02/16/17 1330  ceFEPIme (MAXIPIME) 2 g in dextrose 5 % 50 mL IVPB  Status:  Discontinued     2 g 100 mL/hr over 30 Minutes Intravenous Every 24 hours 02/15/17 1515 02/16/17 1043   02/16/17 1200  fluconazole (DIFLUCAN) IVPB 400 mg  Status:  Discontinued     400 mg 100 mL/hr over 120 Minutes Intravenous Every 24 hours 02/16/17 1048 03/03/17 1133   02/16/17 1000  fluconazole (DIFLUCAN) tablet 400 mg  Status:  Discontinued     400 mg Oral Daily 02/15/17 1615 02/16/17 1048   02/16/17 1000  sulfamethoxazole-trimethoprim (BACTRIM DS,SEPTRA DS) 800-160 MG per tablet 1 tablet  Status:  Discontinued     1 tablet Oral Daily 02/15/17 1615 02/16/17 1308   02/16/17 0200  vancomycin (VANCOCIN) 500 mg in sodium chloride 0.9 % 100 mL IVPB  Status:  Discontinued     500 mg 100 mL/hr over 60 Minutes Intravenous Every 12 hours 02/15/17 1515 02/16/17 1043   02/15/17 1315  ceFEPIme (MAXIPIME) 2 g in dextrose 5 % 50 mL IVPB     2 g 100 mL/hr over  30 Minutes Intravenous  Once 02/15/17 1307 02/15/17 1436   02/15/17 1300  levofloxacin (LEVAQUIN) IVPB 750 mg     750 mg 100 mL/hr over 90 Minutes Intravenous  Once 02/15/17 1254 02/15/17 1533   02/15/17 1300  aztreonam (AZACTAM) 2 g in dextrose 5 % 50 mL IVPB  Status:  Discontinued     2 g 100 mL/hr over 30 Minutes Intravenous  Once 02/15/17 1254 02/15/17 1307   02/15/17 1300  vancomycin (VANCOCIN) IVPB 1000 mg/200 mL premix     1,000 mg 200 mL/hr over 60 Minutes Intravenous  Once 02/15/17 1254 02/15/17 1436      Medications: Scheduled Meds: . amantadine  100 mg Oral BID WC  . azithromycin  1,200 mg Oral Weekly  . dolutegravir  50 mg Oral Daily  . emtricitabine-tenofovir AF  1 tablet Oral Daily  . feeding supplement (ENSURE ENLIVE)  237 mL Oral BID BM  . feeding supplement (PRO-STAT SUGAR FREE 64)  30 mL Oral BID  . fluconazole  400 mg Oral Daily  . lidocaine (PF)  5 mL Intradermal Once  . multivitamin with minerals  1 tablet Oral Daily  .  nystatin  5 mL Oral QID  . polyvinyl alcohol  1 drop Both Eyes QID  . sulfamethoxazole-trimethoprim  1 tablet Oral Daily  . tamsulosin  0.4 mg Oral Daily   Continuous Infusions: . 0.9 % NaCl with KCl 40 mEq / L 50 mL/hr (03/03/17 1427)   PRN Meds:.acetaminophen **OR** acetaminophen, HYDROcodone-acetaminophen, polyethylene glycol, polyvinyl alcohol   Objective: Weight change:   Intake/Output Summary (Last 24 hours) at 03/03/17 1736 Last data filed at 03/03/17 1546  Gross per 24 hour  Intake              360 ml  Output             1400 ml  Net            -1040 ml   Blood pressure 127/71, pulse 82, temperature (!) 97.5 F (36.4 C), temperature source Axillary, resp. rate 16, height 5\' 6"  (1.676 m), weight 95 lb (43.1 kg), SpO2 98 %. Temp:  [97.4 F (36.3 C)-97.5 F (36.4 C)] 97.5 F (36.4 C) (08/03 1546) Pulse Rate:  [72-82] 82 (08/03 1546) Resp:  [16] 16 (08/03 1546) BP: (123-128)/(71-82) 127/71 (08/03 1546) SpO2:  [98 %-100 %]  98 % (08/03 1546)  Physical Exam: General: Alert and awake,non communicative, HEENT: anicteric sclera, pupils reactive to light and accommodation, EOMI CVS regular rate, normal r,  no murmur rubs or gallops Chest: clear to auscultation bilaterally, no wheezing, rales or rhonchi Abdomen: soft nontender, nondistended, normal bowel sounds, Neuro: contractures  CBC:  CBC Latest Ref Rng & Units 03/01/2017 02/26/2017 02/25/2017  WBC 4.0 - 10.5 K/uL 2.5(L) 2.3(L) 2.3(L)  Hemoglobin 13.0 - 17.0 g/dL 10.8(L) 10.2(L) 9.9(L)  Hematocrit 39.0 - 52.0 % 33.9(L) 31.5(L) 30.7(L)  Platelets 150 - 400 K/uL 146(L) 135(L) 129(L)    BMET  Recent Labs  03/01/17 0325  NA 132*  K 3.9  CL 104  CO2 21*  GLUCOSE 85  BUN 12  CREATININE 0.48*  CALCIUM 8.8*    Liver Panel   Recent Labs  03/01/17 0325  PROT 8.8*  ALBUMIN 2.6*  AST 26  ALT 28  ALKPHOS 115  BILITOT 0.5    Sedimentation Rate No results for input(s): ESRSEDRATE in the last 72 hours. C-Reactive Protein No results for input(s): CRP in the last 72 hours.  Micro Results: Recent Results (from the past 720 hour(s))  Culture, blood (Routine x 2)     Status: None   Collection Time: 02/15/17 12:45 PM  Result Value Ref Range Status   Specimen Description BLOOD LEFT ANTECUBITAL  Final   Special Requests   Final    BOTTLES DRAWN AEROBIC ONLY Blood Culture adequate volume   Culture NO GROWTH 5 DAYS  Final   Report Status 02/20/2017 FINAL  Final  Culture, blood (Routine x 2)     Status: None   Collection Time: 02/15/17 12:51 PM  Result Value Ref Range Status   Specimen Description BLOOD RIGHT ANTECUBITAL  Final   Special Requests IN PEDIATRIC BOTTLE Blood Culture adequate volume  Final   Culture NO GROWTH 5 DAYS  Final   Report Status 02/20/2017 FINAL  Final  CSF culture     Status: None   Collection Time: 02/16/17  1:29 PM  Result Value Ref Range Status   Specimen Description CSF  Final   Special Requests NONE  Final   Gram  Stain   Final    WBC PRESENT, PREDOMINANTLY PMN YEAST CYTOSPIN SMEAR CRITICAL RESULT CALLED TO, READ  BACK BY AND VERIFIED WITH: Candie Chroman RN AT 2202 ON [id number] BY SJW    Culture NO GROWTH 3 DAYS  Final   Report Status 02/19/2017 FINAL  Final  Fungus Culture With Stain     Status: None (Preliminary result)   Collection Time: 02/16/17  1:29 PM  Result Value Ref Range Status   Fungus Stain Final report  Final    Comment: (NOTE) Performed At: [facility] [place], [place] [id number] Lindon Romp MD VV:[id number]    Fungus (Mycology) Culture PENDING  Incomplete   Fungal Source CSF  Final  Fungus Culture Result     Status: None   Collection Time: 02/16/17  1:29 PM  Result Value Ref Range Status   Result 1 Comment  Final    Comment: (NOTE) KOH/Calcofluor preparation:  no fungus observed. Performed At: [facility] [place], [place] [id number] Lindon Romp MD VO:[id number]   MRSA PCR Screening     Status: None   Collection Time: 02/16/17  2:44 PM  Result Value Ref Range Status   MRSA by PCR NEGATIVE NEGATIVE Final    Comment:        The GeneXpert MRSA Assay (FDA approved for NASAL specimens only), is one component of a comprehensive MRSA colonization surveillance program. It is not intended to diagnose MRSA infection nor to guide or monitor treatment for MRSA infections.   CSF culture     Status: None   Collection Time: 02/18/17  2:00 PM  Result Value Ref Range Status   Specimen Description CSF  Final   Special Requests TUBE 2  Final   Gram Stain   Final    WBC PRESENT,BOTH PMN AND MONONUCLEAR YEAST CYTOSPIN SMEAR CRITICAL RESULT CALLED TO, READ BACK BY AND VERIFIED WITH: A. RODRIGUEZ RN, AT 1533 02/18/17 BY D. VANHOOK    Culture NO GROWTH 3 DAYS  Final   Report Status 02/21/2017 FINAL  Final  Culture, fungus without smear     Status: None (Preliminary result)   Collection Time: 02/18/17  2:00 PM  Result Value Ref  Range Status   Specimen Description CSF  Final   Special Requests TUBE 2  Final   Culture NO FUNGUS ISOLATED AFTER 7 DAYS  Final   Report Status PENDING  Incomplete  CSF culture     Status: None   Collection Time: 02/22/17 11:43 AM  Result Value Ref Range Status   Specimen Description CSF  Final   Special Requests TUBE 2  Final   Gram Stain   Final    CYTOSPIN SMEAR WBC PRESENT, PREDOMINANTLY MONONUCLEAR YEAST CORRECTED RESULTS PREVIOUSLY REPORTED AS: NO ORGANISMS SEEN CORRECTED RESULTS CALLED TO: RN Gilman Buttner [id number]    Culture NO GROWTH 3 DAYS  Final   Report Status 02/25/2017 FINAL  Final  CSF culture     Status: None (Preliminary result)   Collection Time: 03/02/17  8:51 AM  Result Value Ref Range Status   Specimen Description CSF  Final   Special Requests NONE  Final   Gram Stain   Final    CYTOSPIN SMEAR WBC PRESENT, PREDOMINANTLY MONONUCLEAR ENCAPSULATED YEAST CRITICAL RESULT CALLED TO, READ BACK BY AND VERIFIED WITH: S. Ward RN 10:05 03/02/17 ([email or web address])    Culture NO GROWTH 1 DAY  Final   Report Status PENDING  Incomplete    Studies/Results: Dg Fluoro Guide Lumbar Puncture  Result Date: 03/02/2017 Gilford Silvius, MD     03/02/2017  1:47 AM Uncomplicated LP using fluoro at L3/4.  OP 18 cm of water.  7 cc collected for requested labs. JWatts MD     Assessment/Plan:  INTERVAL HISTORY:  Extensive discussion today   Principal Problem:   Sepsis ([place]) Active Problems:   AIDS (acquired immune deficiency syndrome) ([facility])   Encephalopathy acute   Cryptococcal meningoencephalitis ([facility])   HIV (human immunodeficiency virus infection) ([place])   AKI (acute kidney injury) ([place])   Protein-calorie malnutrition, severe   Anemia of chronic disease   Acute encephalopathy   Goals of care, counseling/discussion   Palliative care by specialist   Leukopenia   Deep tissue injury bilateral heels   Wound of buttock   Hypokalemia   Encephalitis   Acute renal failure  ([place])   Encephalopathy   Advance care planning    William Pena is a 31 y.o. male with  HIV/AIDS mx medical problems, perirectal abscess, cryptococcal pneumonia and meningoencephalitis requiring IVD, still with high opening pressures this admission, NEVER started on ARV due to fear for uncontrolled IRIS and ICP absent normalized ICP and absent mechanical means of controlling ICP (ie with shunt)  #1 Cryptococcal meningoencephalitis:  Opening pressure is normal yesterday   Continue fluconazole and ok to change to oral formulation  See below re the ARV  #2 Encephalopathy: IDEALLY I WOULD LIKE TO START ARV IF we are to be aggressive as it is possible that the HIV is directly affecting CNS and causing encephalopathy.  Certainly his high ICP could be playing a role and he MAY have suffered permanent Neuro insults  #3 HIV/AIDS  We had extensive discussion today with Mom and Dad (over the phone) and they agreed with approach of starting ARV to see if this might improve his encephalopathy (certainly could if he has HIV encephalopathy). I warned there would be high risk of IRIS including high ICP. 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  Dr. Baxter Flattery will be covering this weekend and is available for questions.    LOS: 16 days   Alcide Evener 03/03/2017, 5:36 PM

## 2017-03-03 NOTE — Progress Notes (Signed)
PROGRESS NOTE    William Pena  NKN:[id number] DOB: 10/21/1985 DOA: 02/15/2017 PCP: [name], MD    Brief Narrative:  31 y.o. 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 Initial impression from ID was that he had relapsed cryptococcal meningitis.  Assessment & Plan:   Principal Problem:   Sepsis ([place]) Active Problems:   AIDS (acquired immune deficiency syndrome) (HCC)   Encephalopathy acute   Cryptococcal meningoencephalitis (HCC)   HIV (human immunodeficiency virus infection) ([place])   AKI (acute kidney injury) ([place])   Protein-calorie malnutrition, severe   Anemia of chronic disease   Acute encephalopathy   Goals of care, counseling/discussion   Palliative care by specialist   Leukopenia   Deep tissue injury bilateral heels   Wound of buttock   Hypokalemia   Encephalitis   Acute renal failure (HCC)   Encephalopathy   Advance care planning  Principal Problem:   Sepsis ([name] to cryptococcal meningeal encephalitis Being treated for relapsed cryptococcal meningitis with fluconazole. Will need 8 weeks of therapy per ID (started 02/16/17 with stopping date 04/13/17), and will likely need 200 mg of fluconazole as oral therapy for another 10 months to equal 12 months of treatment. - LP done 8/2 with WBC of 37 and crypto in fluid aspirate.  - ID recs to continue fluconazole, OK to transition to PO.   Active Problems:   AIDS (acquired immune deficiency syndrome) (HCC)/HIV infection -Had initially been unable to start antiretroviral therapy due to the risk of immune reconstitution inflammatory syndrome per ID. Palliative care consulted. - Per palliative care, wishes are for continued full treatment and Full Code. If patient's condition worsens and/or  patient requires future hospitalization, then family may consider more palliative approach at that time - Patient is continued on Bactrim daily and azithromycin weekly to prevent opportunistic infections. - ID had discussed with family regarding risk and benefits of ARV. Decision made to start ARV with understanding that patient's condition may worsen or that neuro deficits may never improve despite ARV given concerns of possible anoxic brain injury. - DNR wishes noted    Encephalopathy acute Appears to be related to cryptococcal meningeal encephalitis vs anoxic brain injury.  - No significant change in condition was noted - Patient remains non-verbal at this time, tracks with eyes    AKI (acute kidney injury) ([place]) - Suspect presenting AKI secondary to dehydration - Labs were reviewed. Renal function had resolved    Protein-calorie malnutrition, severe - Patient is continued on supplements.  - Body mass index is 20.3 kg/m.  - remains stablet    Anemia of chronic disease/thrombocytopenia/Leukopenia - Hematology had been following.  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  - Continue to transfuse as needed. - Hgb had remained stable    3 full thickness wounds to bilat buttocks; 2X.5X.5cm, 1X1X1cm, 5.5X3X.2cm - Continue wound care per WOC. Per her notes: "Pt had I&D of buttock abscesses in May with the surgical team, according to the EMR. Thesewounds were present on admission but are NOT pressure injuries." - currently stable    Deep tissue injuries bilateral heels - Wound care per wound care team.  - Will continue with floating heels.    Hypokalemia - Potassium has been added to IV fluids - currently stable    Hyponatremia - Mild, monitor. -  Presently stable  DVT prophylaxis: SCD's Code Status: Full Family Communication: Pt in room, patient's mother at bedside Disposition Plan: Uncertain at this time  Consultants:    ID  Neurology  Palliative Care  Procedures:   Intermiitent LP's  Antimicrobials: Anti-infectives    Start     Dose/Rate Route Frequency Ordered Stop   03/03/17 1430  emtricitabine-tenofovir AF (DESCOVY) 200-25 MG per tablet 1 tablet     1 tablet Oral Daily 03/03/17 1324     03/03/17 1330  dolutegravir (TIVICAY) tablet 50 mg     50 mg Oral Daily 03/03/17 1324     03/03/17 1200  fluconazole (DIFLUCAN) tablet 400 mg     400 mg Oral Daily 03/03/17 1133     02/22/17 2000  azithromycin (ZITHROMAX) tablet 1,200 mg     1,200 mg Oral Weekly 02/22/17 1812     02/22/17 2000  sulfamethoxazole-trimethoprim (BACTRIM DS,SEPTRA DS) 800-160 MG per tablet 1 tablet     1 tablet Oral Daily 02/22/17 1812     02/17/17 1400  levofloxacin (LEVAQUIN) IVPB 750 mg  Status:  Discontinued     750 mg 100 mL/hr over 90 Minutes Intravenous Every 48 hours 02/15/17 1515 02/16/17 1043   02/17/17 1000  azithromycin (ZITHROMAX) tablet 1,200 mg  Status:  Discontinued     1,200 mg Oral Every Fri 02/15/17 1615 02/16/17 1308   02/16/17 1330  ceFEPIme (MAXIPIME) 2 g in dextrose 5 % 50 mL IVPB  Status:  Discontinued     2 g 100 mL/hr over 30 Minutes Intravenous Every 24 hours 02/15/17 1515 02/16/17 1043   02/16/17 1200  fluconazole (DIFLUCAN) IVPB 400 mg  Status:  Discontinued     400 mg 100 mL/hr over 120 Minutes Intravenous Every 24 hours 02/16/17 1048 03/03/17 1133   02/16/17 1000  fluconazole (DIFLUCAN) tablet 400 mg  Status:  Discontinued     400 mg Oral Daily 02/15/17 1615 02/16/17 1048   02/16/17 1000  sulfamethoxazole-trimethoprim (BACTRIM DS,SEPTRA DS) 800-160 MG per tablet 1 tablet  Status:  Discontinued     1 tablet Oral Daily 02/15/17 1615 02/16/17 1308   02/16/17 0200  vancomycin (VANCOCIN) 500 mg in sodium chloride 0.9 % 100 mL IVPB  Status:  Discontinued     500 mg 100 mL/hr over 60 Minutes Intravenous Every 12 hours 02/15/17 1515 02/16/17 1043   02/15/17 1315  ceFEPIme (MAXIPIME) 2 g in dextrose 5  % 50 mL IVPB     2 g 100 mL/hr over 30 Minutes Intravenous  Once 02/15/17 1307 02/15/17 1436   02/15/17 1300  levofloxacin (LEVAQUIN) IVPB 750 mg     750 mg 100 mL/hr over 90 Minutes Intravenous  Once 02/15/17 1254 02/15/17 1533   02/15/17 1300  aztreonam (AZACTAM) 2 g in dextrose 5 % 50 mL IVPB  Status:  Discontinued     2 g 100 mL/hr over 30 Minutes Intravenous  Once 02/15/17 1254 02/15/17 1307   02/15/17 1300  vancomycin (VANCOCIN) IVPB 1000 mg/200 mL premix     1,000 mg 200 mL/hr over 60 Minutes Intravenous  Once 02/15/17 1254 02/15/17 1436      Subjective: Awake, tracks with eyes, nonverbal  Objective: Vitals:   03/02/17 1537 03/02/17 2053 03/03/17 0617 03/03/17 1546  BP: 116/76 128/82 123/75 127/71  Pulse: 73 77 72 82  Resp: 16 16 16 16   Temp: (!) 97.4 F (36.3 C) (!) 97.4 F (36.3 C)  (!) 97.5 F (36.4 C)  TempSrc: Oral Oral  Axillary  SpO2: 100% 100% 100% 98%  Weight:      Height:        Intake/Output Summary (Last 24 hours) at 03/03/17 1750 Last data filed at 03/03/17 1546  Gross per 24 hour  Intake              360 ml  Output             1400 ml  Net            -1040 ml   Filed Weights   02/16/17 1407 03/02/17 0439  Weight: 57.1 kg (125 lb 12.8 oz) 43.1 kg (95 lb)    Examination: General exam: non-verbal,  in no acute distress Respiratory system: normal chest rise, clear, no audible wheezing Cardiovascular system: regular rhythm, s1-s2 Gastrointestinal system: Nondistended, nontender, pos BS Central nervous system: No seizures, no tremors Extremities: No cyanosis, no joint deformities Skin: No rashes, no pallor Psychiatry: Cannot assess given nonverbal state  Data Reviewed: I have personally reviewed following labs and imaging studies  CBC:  Recent Labs Lab 02/25/17 0306 02/26/17 0300 03/01/17 0325  WBC 2.3* 2.3* 2.5*  NEUTROABS 1.1* 1.3*  --   HGB 9.9* 10.2* 10.8*  HCT 30.7* 31.5* 33.9*  MCV 85.8 86.8 85.8  PLT 129* 135* 146*   Basic  Metabolic Panel:  Recent Labs Lab 02/26/17 0300 03/01/17 0325  NA 133* 132*  K 4.0 3.9  CL 107 104  CO2 21* 21*  GLUCOSE 85 85  BUN 12 12  CREATININE 0.53* 0.48*  CALCIUM 8.5* 8.8*   GFR: Estimated Creatinine Clearance: 81.6 mL/min (A) (by C-G formula based on SCr of 0.48 mg/dL (L)). Liver Function Tests:  Recent Labs Lab 03/01/17 0325  AST 26  ALT 28  ALKPHOS 115  BILITOT 0.5  PROT 8.8*  ALBUMIN 2.6*   No results for input(s): LIPASE, AMYLASE in the last 168 hours. No results for input(s): AMMONIA in the last 168 hours. Coagulation Profile: No results for input(s): INR, PROTIME in the last 168 hours. Cardiac Enzymes: No results for input(s): CKTOTAL, CKMB, CKMBINDEX, TROPONINI in the last 168 hours. BNP (last 3 results) No results for input(s): PROBNP in the last 8760 hours. HbA1C: No results for input(s): HGBA1C in the last 72 hours. CBG: No results for input(s): GLUCAP in the last 168 hours. Lipid Profile: No results for input(s): CHOL, HDL, LDLCALC, TRIG, CHOLHDL, LDLDIRECT in the last 72 hours. Thyroid Function Tests: No results for input(s): TSH, T4TOTAL, FREET4, T3FREE, THYROIDAB in the last 72 hours. Anemia Panel: No results for input(s): VITAMINB12, FOLATE, FERRITIN, TIBC, IRON, RETICCTPCT in the last 72 hours. Sepsis Labs: No results for input(s): PROCALCITON, LATICACIDVEN in the last 168 hours.  Recent Results (from the past 240 hour(s))  CSF culture     Status: None   Collection Time: 02/22/17 11:43 AM  Result Value Ref Range Status   Specimen Description CSF  Final   Special Requests TUBE 2  Final   Gram Stain   Final    CYTOSPIN SMEAR WBC PRESENT, PREDOMINANTLY MONONUCLEAR YEAST CORRECTED RESULTS PREVIOUSLY REPORTED AS: NO ORGANISMS SEEN CORRECTED RESULTS CALLED TO: RN Gilman Buttner [id number]    Culture NO GROWTH 3 DAYS  Final   Report Status 02/25/2017 FINAL  Final  CSF culture     Status: None (Preliminary result)   Collection  Time: 03/02/17  8:51 AM  Result Value Ref Range Status   Specimen Description CSF  Final   Special  Requests NONE  Final   Gram Stain   Final    CYTOSPIN SMEAR WBC PRESENT, PREDOMINANTLY MONONUCLEAR ENCAPSULATED YEAST CRITICAL RESULT CALLED TO, READ BACK BY AND VERIFIED WITH: S. Ward RN 10:05 03/02/17 ([email or web address])    Culture NO GROWTH 1 DAY  Final   Report Status PENDING  Incomplete     Radiology Studies: Dg Fluoro Guide Lumbar Puncture  Result Date: 03/02/2017 Gilford Silvius, MD     10/04/7015  7:93 AM Uncomplicated LP using fluoro at L3/4.  OP 18 cm of water.  7 cc collected for requested labs. JWatts MD   Scheduled Meds: . amantadine  100 mg Oral BID WC  . azithromycin  1,200 mg Oral Weekly  . dolutegravir  50 mg Oral Daily  . emtricitabine-tenofovir AF  1 tablet Oral Daily  . feeding supplement (ENSURE ENLIVE)  237 mL Oral BID BM  . feeding supplement (PRO-STAT SUGAR FREE 64)  30 mL Oral BID  . fluconazole  400 mg Oral Daily  . lidocaine (PF)  5 mL Intradermal Once  . multivitamin with minerals  1 tablet Oral Daily  . nystatin  5 mL Oral QID  . polyvinyl alcohol  1 drop Both Eyes QID  . sulfamethoxazole-trimethoprim  1 tablet Oral Daily  . tamsulosin  0.4 mg Oral Daily   Continuous Infusions: . 0.9 % NaCl with KCl 40 mEq / L 50 mL/hr (03/03/17 1427)     LOS: 16 days   Daven Pinckney, Orpah Melter, MD [facility] Pager [phone]  If 7PM-7AM, please contact night-coverage [email or web address] Password [email or web address] 03/03/2017, 5:50 PM

## 2017-03-04 ENCOUNTER — Inpatient Hospital Stay (HOSPITAL_COMMUNITY): Payer: Medicaid Other

## 2017-03-04 LAB — BASIC METABOLIC PANEL
Anion gap: 7 (ref 5–15)
BUN: 13 mg/dL (ref 6–20)
CHLORIDE: 105 mmol/L (ref 101–111)
CO2: 18 mmol/L — ABNORMAL LOW (ref 22–32)
Calcium: 8.9 mg/dL (ref 8.9–10.3)
Creatinine, Ser: 0.54 mg/dL — ABNORMAL LOW (ref 0.61–1.24)
GFR calc non Af Amer: >60 mL/min (ref >60)
Glucose, Bld: 91 mg/dL (ref 65–99)
POTASSIUM: 3.9 mmol/L (ref 3.5–5.1)
SODIUM: 130 mmol/L — AB (ref 135–145)

## 2017-03-04 LAB — CBC
HEMATOCRIT: 33.9 % — AB (ref 39.0–52.0)
Hemoglobin: 11.2 g/dL — ABNORMAL LOW (ref 13.0–17.0)
MCH: 28.3 pg (ref 26.0–34.0)
MCHC: 33 g/dL (ref 30.0–36.0)
MCV: 85.6 fL (ref 78.0–100.0)
PLATELETS: 158 10*3/uL (ref 150–400)
RBC: 3.96 MIL/uL — AB (ref 4.22–5.81)
RDW: 15.5 % (ref 11.5–15.5)
WBC: 2.5 10*3/uL — AB (ref 4.0–10.5)

## 2017-03-04 NOTE — Progress Notes (Addendum)
PROGRESS NOTE    William Pena  SAY:[id number] DOB: 11/30/1985 DOA: 02/15/2017 PCP: [name], MD    Brief Narrative:  31 y.o. 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 Initial impression from ID was that he had relapsed cryptococcal meningitis.  Assessment & Plan:   Principal Problem:   Sepsis ([place]) Active Problems:   AIDS (acquired immune deficiency syndrome) (HCC)   Encephalopathy acute   Cryptococcal meningoencephalitis (HCC)   HIV (human immunodeficiency virus infection) ([place])   AKI (acute kidney injury) ([place])   Protein-calorie malnutrition, severe   Anemia of chronic disease   Acute encephalopathy   Goals of care, counseling/discussion   Palliative care by specialist   Leukopenia   Deep tissue injury bilateral heels   Wound of buttock   Hypokalemia   Encephalitis   Acute renal failure (HCC)   Encephalopathy   Advance care planning  Principal Problem:   Sepsis ([name] to cryptococcal meningeal encephalitis Being treated for relapsed cryptococcal meningitis with fluconazole. Will need 8 weeks of therapy per ID (started 02/16/17 with stopping date 04/13/17), and will likely need 200 mg of fluconazole as oral therapy for another 10 months to equal 12 months of treatment. - LP done 8/2 with WBC of 37 and crypto in fluid aspirate.  - ID recs recently noted to continue fluconazole, OK to transition to PO.  - This AM, patient with eyes staring to the L and L arm raised above bed. New finding. Ordered STAT head CT, results reviewed. Findings largely unchanged from prior study with no acute changes noted. Later, patient returned to baseline state and was fed lunch without issues  Active Problems:   AIDS (acquired immune deficiency syndrome)  (HCC)/HIV infection -Had initially been unable to start antiretroviral therapy due to the risk of immune reconstitution inflammatory syndrome per ID. Palliative care consulted. - Per palliative care, wishes are for continued full treatment and Full Code. If patient's condition worsens and/or patient requires future hospitalization, then family may consider more palliative approach at that time - Patient is continued on Bactrim daily and azithromycin weekly to prevent opportunistic infections. - ID had discussed with family regarding risk and benefits of ARV. Decision made to start ARV with understanding that patient's condition may worsen or that neuro deficits may never improve despite ARV given concerns of possible anoxic brain injury. - Stable at present - DNR wishes are noted    Encephalopathy acute Appears to be related to cryptococcal meningeal encephalitis vs anoxic brain injury.  - No significant change in condition has been noted - Patient remains non-verbal at this time, tracks with eyes    AKI (acute kidney injury) ([place]) - Suspect presenting AKI secondary to dehydration - Labs were reviewed. Renal function had resolved    Protein-calorie malnutrition, severe - Patient is continued on supplements.  - Body mass index is 20.3 kg/m.  - remains stable at this time    Anemia of chronic disease/thrombocytopenia/Leukopenia - Hematology had been following.  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  - Continue to transfuse as needed. - Hgb thus far stable    3 full thickness wounds to bilat buttocks; 2X.5X.5cm, 1X1X1cm, 5.5X3X.2cm - Continue wound care per WOC. Per her notes: "Pt had I&D of buttock abscesses in May with the surgical team, according to the EMR.  Thesewounds were present on admission but are NOT pressure injuries." - presently stable    Deep tissue injuries bilateral heels - Wound care per wound care team.  - Will continue with floating heels  as tolerated    Hypokalemia - Potassium has been added to IV fluids - remains stable    Hyponatremia - Mild, monitor. - currently stable  DVT prophylaxis: SCD's Code Status: Full Family Communication: Pt in room Disposition Plan: Uncertain at this time  Consultants:   ID  Neurology  Palliative Care  Procedures:   Intermiitent LP's  Antimicrobials: Anti-infectives    Start     Dose/Rate Route Frequency Ordered Stop   03/03/17 1430  emtricitabine-tenofovir AF (DESCOVY) 200-25 MG per tablet 1 tablet     1 tablet Oral Daily 03/03/17 1324     03/03/17 1330  dolutegravir (TIVICAY) tablet 50 mg     50 mg Oral Daily 03/03/17 1324     03/03/17 1200  fluconazole (DIFLUCAN) tablet 400 mg     400 mg Oral Daily 03/03/17 1133     02/22/17 2000  azithromycin (ZITHROMAX) tablet 1,200 mg     1,200 mg Oral Weekly 02/22/17 1812     02/22/17 2000  sulfamethoxazole-trimethoprim (BACTRIM DS,SEPTRA DS) 800-160 MG per tablet 1 tablet     1 tablet Oral Daily 02/22/17 1812     02/17/17 1400  levofloxacin (LEVAQUIN) IVPB 750 mg  Status:  Discontinued     750 mg 100 mL/hr over 90 Minutes Intravenous Every 48 hours 02/15/17 1515 02/16/17 1043   02/17/17 1000  azithromycin (ZITHROMAX) tablet 1,200 mg  Status:  Discontinued     1,200 mg Oral Every Fri 02/15/17 1615 02/16/17 1308   02/16/17 1330  ceFEPIme (MAXIPIME) 2 g in dextrose 5 % 50 mL IVPB  Status:  Discontinued     2 g 100 mL/hr over 30 Minutes Intravenous Every 24 hours 02/15/17 1515 02/16/17 1043   02/16/17 1200  fluconazole (DIFLUCAN) IVPB 400 mg  Status:  Discontinued     400 mg 100 mL/hr over 120 Minutes Intravenous Every 24 hours 02/16/17 1048 03/03/17 1133   02/16/17 1000  fluconazole (DIFLUCAN) tablet 400 mg  Status:  Discontinued     400 mg Oral Daily 02/15/17 1615 02/16/17 1048   02/16/17 1000  sulfamethoxazole-trimethoprim (BACTRIM DS,SEPTRA DS) 800-160 MG per tablet 1 tablet  Status:  Discontinued     1 tablet Oral Daily  02/15/17 1615 02/16/17 1308   02/16/17 0200  vancomycin (VANCOCIN) 500 mg in sodium chloride 0.9 % 100 mL IVPB  Status:  Discontinued     500 mg 100 mL/hr over 60 Minutes Intravenous Every 12 hours 02/15/17 1515 02/16/17 1043   02/15/17 1315  ceFEPIme (MAXIPIME) 2 g in dextrose 5 % 50 mL IVPB     2 g 100 mL/hr over 30 Minutes Intravenous  Once 02/15/17 1307 02/15/17 1436   02/15/17 1300  levofloxacin (LEVAQUIN) IVPB 750 mg     750 mg 100 mL/hr over 90 Minutes Intravenous  Once 02/15/17 1254 02/15/17 1533   02/15/17 1300  aztreonam (AZACTAM) 2 g in dextrose 5 % 50 mL IVPB  Status:  Discontinued     2 g 100 mL/hr over 30 Minutes Intravenous  Once 02/15/17 1254 02/15/17 1307   02/15/17 1300  vancomycin (VANCOCIN) IVPB 1000 mg/200 mL premix     1,000 mg 200 mL/hr over 60 Minutes Intravenous  Once 02/15/17 1254 02/15/17 1436      Subjective: Remains nonverbal  this AM  Objective: Vitals:   03/03/17 1546 03/03/17 2103 03/04/17 0458 03/04/17 1525  BP: 127/71 104/82 107/69 108/73  Pulse: 82 86 80 84  Resp: 16 16 16 18   Temp: (!) 97.5 F (36.4 C) (!) 97.4 F (36.3 C) 97.7 F (36.5 C) 97.7 F (36.5 C)  TempSrc: Axillary Oral Oral Oral  SpO2: 98% 96% 100% 100%  Weight:   34 kg (75 lb)   Height:        Intake/Output Summary (Last 24 hours) at 03/04/17 1654 Last data filed at 03/04/17 1007  Gross per 24 hour  Intake                0 ml  Output              950 ml  Net             -950 ml   Filed Weights   02/16/17 1407 03/02/17 0439 03/04/17 0458  Weight: 57.1 kg (125 lb 12.8 oz) 43.1 kg (95 lb) 34 kg (75 lb)    Examination: General exam: Awake, laying in bed, in nad Respiratory system: Normal respiratory effort, no wheezing Cardiovascular system: regular rate, s1, s2 Gastrointestinal system: Soft, nondistended, positive BS Central nervous system: CN2-12 grossly intact, strength intact Extremities: Perfused, no clubbing Skin: Normal skin turgor Psychiatry: unable to  assess given baseline mental status   Data Reviewed: I have personally reviewed following labs and imaging studies  CBC:  Recent Labs Lab 02/26/17 0300 03/01/17 0325 03/04/17 0424  WBC 2.3* 2.5* 2.5*  NEUTROABS 1.3*  --   --   HGB 10.2* 10.8* 11.2*  HCT 31.5* 33.9* 33.9*  MCV 86.8 85.8 85.6  PLT 135* 146* 789   Basic Metabolic Panel:  Recent Labs Lab 02/26/17 0300 03/01/17 0325 03/04/17 0424  NA 133* 132* 130*  K 4.0 3.9 3.9  CL 107 104 105  CO2 21* 21* 18*  GLUCOSE 85 85 91  BUN 12 12 13   CREATININE 0.53* 0.48* 0.54*  CALCIUM 8.5* 8.8* 8.9   GFR: Estimated Creatinine Clearance: 64.3 mL/min (A) (by C-G formula based on SCr of 0.54 mg/dL (L)). Liver Function Tests:  Recent Labs Lab 03/01/17 0325  AST 26  ALT 28  ALKPHOS 115  BILITOT 0.5  PROT 8.8*  ALBUMIN 2.6*   No results for input(s): LIPASE, AMYLASE in the last 168 hours. No results for input(s): AMMONIA in the last 168 hours. Coagulation Profile: No results for input(s): INR, PROTIME in the last 168 hours. Cardiac Enzymes: No results for input(s): CKTOTAL, CKMB, CKMBINDEX, TROPONINI in the last 168 hours. BNP (last 3 results) No results for input(s): PROBNP in the last 8760 hours. HbA1C: No results for input(s): HGBA1C in the last 72 hours. CBG: No results for input(s): GLUCAP in the last 168 hours. Lipid Profile: No results for input(s): CHOL, HDL, LDLCALC, TRIG, CHOLHDL, LDLDIRECT in the last 72 hours. Thyroid Function Tests: No results for input(s): TSH, T4TOTAL, FREET4, T3FREE, THYROIDAB in the last 72 hours. Anemia Panel: No results for input(s): VITAMINB12, FOLATE, FERRITIN, TIBC, IRON, RETICCTPCT in the last 72 hours. Sepsis Labs: No results for input(s): PROCALCITON, LATICACIDVEN in the last 168 hours.  Recent Results (from the past 240 hour(s))  CSF culture     Status: None (Preliminary result)   Collection Time: 03/02/17  8:51 AM  Result Value Ref Range Status   Specimen  Description CSF  Final   Special Requests NONE  Final   Gram  Stain   Final    CYTOSPIN SMEAR WBC PRESENT, PREDOMINANTLY MONONUCLEAR ENCAPSULATED YEAST CRITICAL RESULT CALLED TO, READ BACK BY AND VERIFIED WITH: S. Ward RN 10:05 03/02/17 ([email or web address])    Culture NO GROWTH 2 DAYS  Final   Report Status PENDING  Incomplete     Radiology Studies: Ct Head Wo Contrast  Result Date: 03/04/2017 CLINICAL DATA:  32 y.o. 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 EXAM: CT HEAD WITHOUT CONTRAST TECHNIQUE: Contiguous axial images were obtained from the base of the skull through the vertex without intravenous contrast. COMPARISON:  Head CTs dated 02/15/2017 and 01/06/2017. Brain MRI dated 12/27/2016. FINDINGS: Brain: Patchy areas of low attenuation within the bilateral subcortical white matter regions, most prominent within the white matter of the left frontoparietal lobe, similar to the previous head CT of 02/15/2017 but progressed compared to the earlier head CT of 01/06/2017. Chronic appearing infarct again noted within the left lentiform nucleus. No parenchymal or extra-axial hemorrhage seen. Vascular: No hyperdense vessel or unexpected calcification. Skull: Normal. Negative for fracture or focal lesion. Sinuses/Orbits: No acute finding. Other: None. IMPRESSION: 1. Patchy low-density foci within the bilateral subcortical white matter regions, again most prominent within the white matter of the left frontoparietal lobe, similar to the appearance on most recent head CT of 02/15/2017 but progressed compared to the earlier head CT of 01/06/2017, compatible with previous description of evolving infarcts related to underlying meningitis, now appearing subacute to chronic in age. Additional chronic  appearing infarct within the left basal ganglia, stable. 2. No new/acute findings. No intracranial hemorrhage. No midline shift or herniation. Electronically Signed   By: Franki Cabot M.D.   On: 03/04/2017 12:27    Scheduled Meds: . amantadine  100 mg Oral BID WC  . azithromycin  1,200 mg Oral Weekly  . dolutegravir  50 mg Oral Daily  . emtricitabine-tenofovir AF  1 tablet Oral Daily  . feeding supplement (ENSURE ENLIVE)  237 mL Oral BID BM  . feeding supplement (PRO-STAT SUGAR FREE 64)  30 mL Oral BID  . fluconazole  400 mg Oral Daily  . lidocaine (PF)  5 mL Intradermal Once  . multivitamin with minerals  1 tablet Oral Daily  . nystatin  5 mL Oral QID  . polyvinyl alcohol  1 drop Both Eyes QID  . sulfamethoxazole-trimethoprim  1 tablet Oral Daily  . tamsulosin  0.4 mg Oral Daily   Continuous Infusions: . 0.9 % NaCl with KCl 40 mEq / L 50 mL/hr (03/03/17 1427)     LOS: 17 days   Jeb Schloemer, Orpah Melter, MD [facility] Pager [phone]  If 7PM-7AM, please contact night-coverage [email or web address] Password [facility] 03/04/2017, 4:54 PM

## 2017-03-05 LAB — CBC
HEMATOCRIT: 32.8 % — AB (ref 39.0–52.0)
Hemoglobin: 10.6 g/dL — ABNORMAL LOW (ref 13.0–17.0)
MCH: 27.5 pg (ref 26.0–34.0)
MCHC: 32.3 g/dL (ref 30.0–36.0)
MCV: 85 fL (ref 78.0–100.0)
PLATELETS: 136 10*3/uL — AB (ref 150–400)
RBC: 3.86 MIL/uL — ABNORMAL LOW (ref 4.22–5.81)
RDW: 15.5 % (ref 11.5–15.5)
WBC: 3.1 10*3/uL — ABNORMAL LOW (ref 4.0–10.5)

## 2017-03-05 LAB — BASIC METABOLIC PANEL
Anion gap: 5 (ref 5–15)
BUN: 14 mg/dL (ref 6–20)
CHLORIDE: 107 mmol/L (ref 101–111)
CO2: 21 mmol/L — AB (ref 22–32)
CREATININE: 0.6 mg/dL — AB (ref 0.61–1.24)
Calcium: 9 mg/dL (ref 8.9–10.3)
GFR calc Af Amer: >60 mL/min (ref >60)
GFR calc non Af Amer: >60 mL/min (ref >60)
GLUCOSE: 87 mg/dL (ref 65–99)
Potassium: 3.9 mmol/L (ref 3.5–5.1)
Sodium: 133 mmol/L — ABNORMAL LOW (ref 135–145)

## 2017-03-05 LAB — CSF CULTURE: CULTURE: NO GROWTH

## 2017-03-05 LAB — CSF CULTURE W GRAM STAIN

## 2017-03-05 LAB — PATHOLOGIST SMEAR REVIEW

## 2017-03-05 MED ORDER — FLUCONAZOLE IN SODIUM CHLORIDE 400-0.9 MG/200ML-% IV SOLN
400.0000 mg | INTRAVENOUS | Status: AC
  Administered 2017-03-06 – 2017-03-09 (×4): 400 mg via INTRAVENOUS
  Filled 2017-03-05 (×4): qty 200

## 2017-03-05 NOTE — Progress Notes (Signed)
[facility] for Infectious Disease    Date of Admission:  02/15/2017   Total days of antibiotics    ID: William Pena is a 31 y.o. male with advanced hiv disease and disseminated cryptococcal disease c/b stroke. Right sided weakness and facial droop.  Principal Problem:   Sepsis ([place]) Active Problems:   AIDS (acquired immune deficiency syndrome) (HCC)   Encephalopathy acute   Cryptococcal meningoencephalitis (HCC)   HIV (human immunodeficiency virus infection) ([place])   AKI (acute kidney injury) ([place])   Protein-calorie malnutrition, severe   Anemia of chronic disease   Acute encephalopathy   Goals of care, counseling/discussion   Palliative care by specialist   Leukopenia   Deep tissue injury bilateral heels   Wound of buttock   Hypokalemia   Encephalitis   Acute renal failure ([place])   Encephalopathy   Advance care planning    Subjective: Remains non-verbal, left gaze preference, both arms contracted. Appears that he only had 1/4 of hamburger for lulnch  Medications:  . amantadine  100 mg Oral BID WC  . azithromycin  1,200 mg Oral Weekly  . dolutegravir  50 mg Oral Daily  . emtricitabine-tenofovir AF  1 tablet Oral Daily  . feeding supplement (ENSURE ENLIVE)  237 mL Oral BID BM  . feeding supplement (PRO-STAT SUGAR FREE 64)  30 mL Oral BID  . lidocaine (PF)  5 mL Intradermal Once  . multivitamin with minerals  1 tablet Oral Daily  . nystatin  5 mL Oral QID  . polyvinyl alcohol  1 drop Both Eyes QID  . sulfamethoxazole-trimethoprim  1 tablet Oral Daily  . tamsulosin  0.4 mg Oral Daily    Objective: Vital signs in last 24 hours: Temp:  [97.7 F (36.5 C)-98.9 F (37.2 C)] 97.9 F (36.6 C) (08/05 0501) Pulse Rate:  [68-85] 68 (08/05 0501) Resp:  [16-18] 16 (08/05 0501) BP: (108-142)/(60-73) 130/60 (08/05 0501) SpO2:  [100 %] 100 % (08/05 0501) Physical Exam  Constitutional: He mal-nourished. No distress.  HENT: no scleral icterus, Cardiovascular: Normal  rate, regular rhythm and normal heart sounds. Exam reveals no gallop and no friction rub.  No murmur heard.  Pulmonary/Chest: Effort normal and breath sounds normal. No respiratory distress. He has no wheezes.  Abdominal: Soft. Bowel sounds are normal. He exhibits no distension. There is no tenderness. GU has foley Lymphadenopathy:  He has no cervical adenopathy.  Ext: wasted extremities Skin: Skin is warm and dry. No rash noted. No erythema.  Psychiatric: non verbal   Lab Results  Recent Labs  03/04/17 0424 03/05/17 0358  WBC 2.5* 3.1*  HGB 11.2* 10.6*  HCT 33.9* 32.8*  NA 130* 133*  K 3.9 3.9  CL 105 107  CO2 18* 21*  BUN 13 14  CREATININE 0.54* 0.60*   Microbiology:  Studies/Results: Ct Head Wo Contrast  Result Date: 03/04/2017 CLINICAL DATA:  31 y.o. 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 EXAM: CT HEAD WITHOUT CONTRAST TECHNIQUE: Contiguous axial images were obtained from the base of the skull through the vertex without intravenous contrast. COMPARISON:  Head CTs dated 02/15/2017 and 01/06/2017. Brain MRI dated 12/27/2016. FINDINGS: Brain: Patchy areas of low attenuation within the bilateral subcortical white matter regions, most prominent within the white matter of the left frontoparietal lobe, similar to the previous head  CT of 02/15/2017 but progressed compared to the earlier head CT of 01/06/2017. Chronic appearing infarct again noted within the left lentiform nucleus. No parenchymal or extra-axial hemorrhage seen. Vascular: No hyperdense vessel or unexpected calcification. Skull: Normal. Negative for fracture or focal lesion. Sinuses/Orbits: No acute finding. Other: None. IMPRESSION: 1. Patchy low-density foci within the bilateral subcortical white  matter regions, again most prominent within the white matter of the left frontoparietal lobe, similar to the appearance on most recent head CT of 02/15/2017 but progressed compared to the earlier head CT of 01/06/2017, compatible with previous description of evolving infarcts related to underlying meningitis, now appearing subacute to chronic in age. Additional chronic appearing infarct within the left basal ganglia, stable. 2. No new/acute findings. No intracranial hemorrhage. No midline shift or herniation. Electronically Signed   By: Franki Cabot M.D.   On: 03/04/2017 12:27     Assessment/Plan: Disseminated crypto = concern that he is not taking much by mouth. Will switch fluconazole to IV  Severe protein-calorie malnutrition = recommend to track how much nutritional intact he is receiving. I feel that may need to consider other modalities of delivering medication if he is not taking much by mouth.   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 Neuro exam is limited, does not follow commands, contracted upper extremities, LE weakness. William Pena, [facility] for Infectious Diseases Cell: [phone] Pager: [phone]  03/05/2017, 3:20 PM

## 2017-03-05 NOTE — Progress Notes (Signed)
PROGRESS NOTE    William Pena  JHE:[id number] DOB: January 21, 1986 DOA: 02/15/2017 PCP: [name], MD    Brief Narrative:  31 y.o. 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 Initial impression from ID was that he had relapsed cryptococcal meningitis.  Assessment & Plan:   Principal Problem:   Sepsis ([place]) Active Problems:   AIDS (acquired immune deficiency syndrome) (HCC)   Encephalopathy acute   Cryptococcal meningoencephalitis (HCC)   HIV (human immunodeficiency virus infection) ([place])   AKI (acute kidney injury) ([place])   Protein-calorie malnutrition, severe   Anemia of chronic disease   Acute encephalopathy   Goals of care, counseling/discussion   Palliative care by specialist   Leukopenia   Deep tissue injury bilateral heels   Wound of buttock   Hypokalemia   Encephalitis   Acute renal failure (HCC)   Encephalopathy   Advance care planning  Principal Problem:   Sepsis ([name] to cryptococcal meningeal encephalitis Being treated for relapsed cryptococcal meningitis with fluconazole. Will need 8 weeks of therapy per ID (started 02/16/17 with stopping date 04/13/17), and will likely need 200 mg of fluconazole as oral therapy for another 10 months to equal 12 months of treatment. - LP done 8/2 with WBC of 37 and crypto in fluid aspirate.  - ID recs reviewed. Given limited PO, Fluconazole has been switched to IV  Active Problems:   AIDS (acquired immune deficiency syndrome) (HCC)/HIV infection -Had initially been unable to start antiretroviral therapy due to the risk of immune reconstitution inflammatory syndrome per ID. Palliative care consulted. - Per palliative care, wishes are for continued full treatment and Full Code. If patient's condition  worsens and/or patient requires future hospitalization, then family may consider more palliative approach at that time - Patient is continued on Bactrim daily and azithromycin weekly to prevent opportunistic infections. - ID had discussed with family regarding risk and benefits of ARV. Decision made to start ARV with understanding that patient's condition may worsen or that neuro deficits may never improve despite ARV given concerns of possible anoxic brain injury. - Stable at present - DNR wishes are noted    Encephalopathy acute Appears to be related to cryptococcal meningeal encephalitis vs anoxic brain injury.  - No significant change in condition has been noted - Patient remains non-verbal at this time, tracks with eyes    AKI (acute kidney injury) ([place]) - Suspect presenting AKI secondary to dehydration - Labs were reviewed. Renal function had resolved    Protein-calorie malnutrition, severe - Patient is continued on supplements.  - Body mass index is 20.3 kg/m.  - remains stable at this time    Anemia of chronic disease/thrombocytopenia/Leukopenia - Hematology had been following.  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  - Continue to transfuse as needed. - Hgb thus far stable    3 full thickness wounds to bilat buttocks; 2X.5X.5cm, 1X1X1cm, 5.5X3X.2cm - Continue wound care per WOC. Per her notes: "Pt had I&D of buttock abscesses in May with the surgical team, according to the EMR. Thesewounds were present on admission but are NOT pressure injuries." - presently stable    Deep tissue injuries bilateral heels - Wound care per wound care team.  - Will continue with floating heels as tolerated    Hypokalemia - Potassium has been added to IV  fluids - remains stable    Hyponatremia - Mild, monitor. - currently stable  DVT prophylaxis: SCD's Code Status: Full Family Communication: Pt in room Disposition Plan: Uncertain at this  time  Consultants:   ID  Neurology  Palliative Care  Procedures:   Intermiitent LP's  Antimicrobials: Anti-infectives    Start     Dose/Rate Route Frequency Ordered Stop   03/06/17 1400  fluconazole (DIFLUCAN) IVPB 400 mg     400 mg 100 mL/hr over 120 Minutes Intravenous Every 24 hours 03/05/17 1519     03/03/17 1430  emtricitabine-tenofovir AF (DESCOVY) 200-25 MG per tablet 1 tablet     1 tablet Oral Daily 03/03/17 1324     03/03/17 1330  dolutegravir (TIVICAY) tablet 50 mg     50 mg Oral Daily 03/03/17 1324     03/03/17 1200  fluconazole (DIFLUCAN) tablet 400 mg  Status:  Discontinued     400 mg Oral Daily 03/03/17 1133 03/05/17 1519   02/22/17 2000  azithromycin (ZITHROMAX) tablet 1,200 mg     1,200 mg Oral Weekly 02/22/17 1812     02/22/17 2000  sulfamethoxazole-trimethoprim (BACTRIM DS,SEPTRA DS) 800-160 MG per tablet 1 tablet     1 tablet Oral Daily 02/22/17 1812     02/17/17 1400  levofloxacin (LEVAQUIN) IVPB 750 mg  Status:  Discontinued     750 mg 100 mL/hr over 90 Minutes Intravenous Every 48 hours 02/15/17 1515 02/16/17 1043   02/17/17 1000  azithromycin (ZITHROMAX) tablet 1,200 mg  Status:  Discontinued     1,200 mg Oral Every Fri 02/15/17 1615 02/16/17 1308   02/16/17 1330  ceFEPIme (MAXIPIME) 2 g in dextrose 5 % 50 mL IVPB  Status:  Discontinued     2 g 100 mL/hr over 30 Minutes Intravenous Every 24 hours 02/15/17 1515 02/16/17 1043   02/16/17 1200  fluconazole (DIFLUCAN) IVPB 400 mg  Status:  Discontinued     400 mg 100 mL/hr over 120 Minutes Intravenous Every 24 hours 02/16/17 1048 03/03/17 1133   02/16/17 1000  fluconazole (DIFLUCAN) tablet 400 mg  Status:  Discontinued     400 mg Oral Daily 02/15/17 1615 02/16/17 1048   02/16/17 1000  sulfamethoxazole-trimethoprim (BACTRIM DS,SEPTRA DS) 800-160 MG per tablet 1 tablet  Status:  Discontinued     1 tablet Oral Daily 02/15/17 1615 02/16/17 1308   02/16/17 0200  vancomycin (VANCOCIN) 500 mg in sodium chloride  0.9 % 100 mL IVPB  Status:  Discontinued     500 mg 100 mL/hr over 60 Minutes Intravenous Every 12 hours 02/15/17 1515 02/16/17 1043   02/15/17 1315  ceFEPIme (MAXIPIME) 2 g in dextrose 5 % 50 mL IVPB     2 g 100 mL/hr over 30 Minutes Intravenous  Once 02/15/17 1307 02/15/17 1436   02/15/17 1300  levofloxacin (LEVAQUIN) IVPB 750 mg     750 mg 100 mL/hr over 90 Minutes Intravenous  Once 02/15/17 1254 02/15/17 1533   02/15/17 1300  aztreonam (AZACTAM) 2 g in dextrose 5 % 50 mL IVPB  Status:  Discontinued     2 g 100 mL/hr over 30 Minutes Intravenous  Once 02/15/17 1254 02/15/17 1307   02/15/17 1300  vancomycin (VANCOCIN) IVPB 1000 mg/200 mL premix     1,000 mg 200 mL/hr over 60 Minutes Intravenous  Once 02/15/17 1254 02/15/17 1436      Subjective: Nonverbal this AM  Objective: Vitals:   03/04/17 1525 03/04/17 2135 03/05/17 0501 03/05/17 1533  BP: 108/73 (!) 142/71 130/60 103/65  Pulse: 84 85 68 77  Resp: 18 16 16 16   Temp: 97.7 F (36.5 C) 98.9 F (37.2 C) 97.9 F (36.6 C) (!) 97.3 F (36.3 C)  TempSrc: Oral   Oral  SpO2: 100% 100% 100% 100%  Weight:      Height:        Intake/Output Summary (Last 24 hours) at 03/05/17 1752 Last data filed at 03/05/17 1500  Gross per 24 hour  Intake              600 ml  Output             1250 ml  Net             -650 ml   Filed Weights   02/16/17 1407 03/02/17 0439 03/04/17 0458  Weight: 57.1 kg (125 lb 12.8 oz) 43.1 kg (95 lb) 34 kg (75 lb)    Examination: General exam: asleep, arousable, laying in bed Respiratory system: Normal respiratory effort, no wheezing Cardiovascular system: regular rate, s1, s2    Data Reviewed: I have personally reviewed following labs and imaging studies  CBC:  Recent Labs Lab 03/01/17 0325 03/04/17 0424 03/05/17 0358  WBC 2.5* 2.5* 3.1*  HGB 10.8* 11.2* 10.6*  HCT 33.9* 33.9* 32.8*  MCV 85.8 85.6 85.0  PLT 146* 158 100*   Basic Metabolic Panel:  Recent Labs Lab 03/01/17 0325  03/04/17 0424 03/05/17 0358  NA 132* 130* 133*  K 3.9 3.9 3.9  CL 104 105 107  CO2 21* 18* 21*  GLUCOSE 85 91 87  BUN 12 13 14   CREATININE 0.48* 0.54* 0.60*  CALCIUM 8.8* 8.9 9.0   GFR: Estimated Creatinine Clearance: 64.3 mL/min (A) (by C-G formula based on SCr of 0.6 mg/dL (L)). Liver Function Tests:  Recent Labs Lab 03/01/17 0325  AST 26  ALT 28  ALKPHOS 115  BILITOT 0.5  PROT 8.8*  ALBUMIN 2.6*   No results for input(s): LIPASE, AMYLASE in the last 168 hours. No results for input(s): AMMONIA in the last 168 hours. Coagulation Profile: No results for input(s): INR, PROTIME in the last 168 hours. Cardiac Enzymes: No results for input(s): CKTOTAL, CKMB, CKMBINDEX, TROPONINI in the last 168 hours. BNP (last 3 results) No results for input(s): PROBNP in the last 8760 hours. HbA1C: No results for input(s): HGBA1C in the last 72 hours. CBG: No results for input(s): GLUCAP in the last 168 hours. Lipid Profile: No results for input(s): CHOL, HDL, LDLCALC, TRIG, CHOLHDL, LDLDIRECT in the last 72 hours. Thyroid Function Tests: No results for input(s): TSH, T4TOTAL, FREET4, T3FREE, THYROIDAB in the last 72 hours. Anemia Panel: No results for input(s): VITAMINB12, FOLATE, FERRITIN, TIBC, IRON, RETICCTPCT in the last 72 hours. Sepsis Labs: No results for input(s): PROCALCITON, LATICACIDVEN in the last 168 hours.  Recent Results (from the past 240 hour(s))  CSF culture     Status: None   Collection Time: 03/02/17  8:51 AM  Result Value Ref Range Status   Specimen Description CSF  Final   Special Requests NONE  Final   Gram Stain   Final    CYTOSPIN SMEAR WBC PRESENT, PREDOMINANTLY MONONUCLEAR ENCAPSULATED YEAST CRITICAL RESULT CALLED TO, READ BACK BY AND VERIFIED WITH: S. Ward RN 10:05 03/02/17 ([email or web address])    Culture NO GROWTH 3 DAYS  Final   Report Status 03/05/2017 FINAL  Final  Fungus Culture With Stain     Status: None (Preliminary result)  Collection Time:  03/02/17  8:56 AM  Result Value Ref Range Status   Fungus Stain Final report  Final    Comment: (NOTE) Performed At: [facility] [place], [place] [id number] Lindon Romp MD WU:[id number]    Fungus (Mycology) Culture PENDING  Incomplete   Fungal Source PENDING  Incomplete  Fungus Culture Result     Status: None   Collection Time: 03/02/17  8:56 AM  Result Value Ref Range Status   Result 1 Comment  Final    Comment: (NOTE) KOH/Calcofluor preparation:  no fungus observed. Performed At: [facility] [place], [place] [id number] Lindon Romp MD VH:[id number]      Radiology Studies: Ct Head Wo Contrast  Result Date: 03/04/2017 CLINICAL DATA:  30 y.o. 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 EXAM: CT HEAD WITHOUT CONTRAST TECHNIQUE: Contiguous axial images were obtained from the base of the skull through the vertex without intravenous contrast. COMPARISON:  Head CTs dated 02/15/2017 and 01/06/2017. Brain MRI dated 12/27/2016. FINDINGS: Brain: Patchy areas of low attenuation within the bilateral subcortical white matter regions, most prominent within the white matter of the left frontoparietal lobe, similar to the previous head CT of 02/15/2017 but progressed compared to the earlier head CT of 01/06/2017. Chronic appearing infarct again noted within the left lentiform nucleus. No parenchymal or extra-axial hemorrhage seen. Vascular: No hyperdense vessel or unexpected calcification. Skull: Normal. Negative for fracture or focal lesion. Sinuses/Orbits: No acute finding. Other: None. IMPRESSION: 1. Patchy low-density foci within the bilateral subcortical white matter regions, again most prominent within the white matter of the  left frontoparietal lobe, similar to the appearance on most recent head CT of 02/15/2017 but progressed compared to the earlier head CT of 01/06/2017, compatible with previous description of evolving infarcts related to underlying meningitis, now appearing subacute to chronic in age. Additional chronic appearing infarct within the left basal ganglia, stable. 2. No new/acute findings. No intracranial hemorrhage. No midline shift or herniation. Electronically Signed   By: Franki Cabot M.D.   On: 03/04/2017 12:27    Scheduled Meds: . amantadine  100 mg Oral BID WC  . azithromycin  1,200 mg Oral Weekly  . dolutegravir  50 mg Oral Daily  . emtricitabine-tenofovir AF  1 tablet Oral Daily  . feeding supplement (ENSURE ENLIVE)  237 mL Oral BID BM  . feeding supplement (PRO-STAT SUGAR FREE 64)  30 mL Oral BID  . lidocaine (PF)  5 mL Intradermal Once  . multivitamin with minerals  1 tablet Oral Daily  . nystatin  5 mL Oral QID  . polyvinyl alcohol  1 drop Both Eyes QID  . sulfamethoxazole-trimethoprim  1 tablet Oral Daily  . tamsulosin  0.4 mg Oral Daily   Continuous Infusions: . 0.9 % NaCl with KCl 40 mEq / L 50 mL/hr (03/03/17 1427)  . [START ON 03/06/2017] fluconazole (DIFLUCAN) IV       LOS: 18 days   Lashaye Fisk, Orpah Melter, MD [facility] Pager [phone]  If 7PM-7AM, please contact night-coverage [email or web address] Password [email or web address] 03/05/2017, 5:52 PM

## 2017-03-06 DIAGNOSIS — E43 Unspecified severe protein-calorie malnutrition: Secondary | ICD-10-CM

## 2017-03-06 MED ORDER — PRO-STAT SUGAR FREE PO LIQD
30.0000 mL | Freq: Four times a day (QID) | ORAL | Status: DC
  Administered 2017-03-06 – 2017-03-08 (×8): 30 mL via ORAL
  Filled 2017-03-06 (×8): qty 30

## 2017-03-06 MED ORDER — LABETALOL HCL 5 MG/ML IV SOLN
5.0000 mg | INTRAVENOUS | Status: DC | PRN
  Administered 2017-03-06: 5 mg via INTRAVENOUS
  Filled 2017-03-06: qty 4

## 2017-03-06 NOTE — Progress Notes (Signed)
Nurse tech notified RN that patient's axillary temp was 96.5.  RN applied warm blankets to patient who is nonverbal. RN rechecked temp and it was 97.5 orally. Will continue to recheck patient temp and monitor for changes.

## 2017-03-06 NOTE — Progress Notes (Signed)
Nutrition Follow-up  DOCUMENTATION CODES:   Underweight, Severe malnutrition in context of chronic illness  INTERVENTION:   -D/c Ensure Enlive po BID, each supplement provides 350 kcal and 20 grams of protein -Continue MVI daily -Increase 30 ml Prostat to QID, each supplement provides 100 kcals and 15 grams protein -Mighty Shake TID with meals  NUTRITION DIAGNOSIS:   Malnutrition (severe) related to chronic illness (AIDS) as evidenced by severe depletion of body fat, severe depletion of muscle mass, percent weight loss.  Ongoing  GOAL:   Patient will meet greater than or equal to 90% of their needs  Progressing  MONITOR:   PO intake, Supplement acceptance, Labs, Weight trends, Skin, I & O's  REASON FOR ASSESSMENT:   Consult Assessment of nutrition requirement/status  ASSESSMENT:   31 yo Male with PMH of AIDS, recent admission for perirectal abscess, development of cryptococcal meningoencephalitis, recent CVA,  ARF, severe protein calorie malnutrition who presents to the ED from facility with increased mental status change. Patient was in usual state of health. On recent discharge (dated 7/9), patient was recommended by ID to continue fluconazole x 4 more weeks before resuming antiviral meds for HIV. Patient is non-verbal at baseline. Patient's mother reported patient's poor PO intake recently.  Pt unable to participate in conversation. No family members present at time of visit.   Meal intake remains variable; noted 0-100% meal completion. On average, pt consuming about 50% of meals. Pt continues to take Prostat supplement and MVI well, however, has been refusing Ensure supplement. Pt mother continues to feed pt when she is visiting pt in the hospital.   Pt continues to lose weight, likely related to variable PO intake, some refusal of supplements, and increased nutrient needs related to wound healing and HIV. Noted a 61# (44.9%) wt loss over the past month, which is  significant for time frame. Pt appears significantly more frail now in comparison to this RD's recollection of last visit with pt during prior hospitalization last month.   Palliative care team continues to follow closely. Pt mother obtained guardianship paperwork and is now the sole decision maker for pt. Pt was made DNR on 03/03/17. Plan for now is to continue full scope treatment, but to transition to comfort care if pt continues to decline. Per PCT notes, plan for family meeting again tomorrow, 03/07/17.   Labs reviewed: Na: 133 (on IV supplementation).   Diet Order:  Diet regular Room service appropriate? Yes; Fluid consistency: Thin  Skin:  Wound (see comment) (3 FT wounds to bilateral buttocks, DPTI rt/lt heels)  Last BM:  03/05/17  Height:   Ht Readings from Last 1 Encounters:  02/17/17 5\' 6"  (1.676 m)    Weight:   Wt Readings from Last 1 Encounters:  03/04/17 75 lb (34 kg)    Ideal Body Weight:  75.4 kg  BMI:  Body mass index is 12.11 kg/m.  Estimated Nutritional Needs:   Kcal:  1400-1600  Protein:  85-100 grams  Fluid:  > 1.4 L  EDUCATION NEEDS:   No education needs identified at this time  Wallis Spizzirri A. Jimmye Norman, RD, LDN, CDE Pager: [phone] After hours Pager: [phone]

## 2017-03-06 NOTE — Progress Notes (Signed)
PROGRESS NOTE    William Pena  BPZ:[id number] DOB: [birth year] DOA: 02/15/2017 PCP: [name], MD    Brief Narrative:  31 y.o. 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 Initial impression from ID was that he had relapsed cryptococcal meningitis.  Assessment & Plan:   Principal Problem:   Sepsis ([place]) Active Problems:   AIDS (acquired immune deficiency syndrome) (HCC)   Encephalopathy acute   Cryptococcal meningoencephalitis (HCC)   HIV (human immunodeficiency virus infection) ([place])   AKI (acute kidney injury) ([place])   Protein-calorie malnutrition, severe   Anemia of chronic disease   Acute encephalopathy   Goals of care, counseling/discussion   Palliative care by specialist   Leukopenia   Deep tissue injury bilateral heels   Wound of buttock   Hypokalemia   Encephalitis   Acute renal failure (HCC)   Encephalopathy   Advance care planning  Principal Problem:   Sepsis ([name] to cryptococcal meningeal encephalitis Being treated for relapsed cryptococcal meningitis with fluconazole. Will need 8 weeks of therapy per ID (started 02/16/17 with stopping date 04/13/17), and will likely need 200 mg of fluconazole as oral therapy for another 10 months to equal 12 months of treatment. - LP done 8/2 with WBC of 37 and crypto in fluid aspirate.  - Given limited PO, patient's Fluconazole was been switched from to IV  Active Problems:   AIDS (acquired immune deficiency syndrome) (HCC)/HIV infection -Had initially been unable to start antiretroviral therapy due to the risk of immune reconstitution inflammatory syndrome per ID. Palliative care consulted. - Per palliative care, wishes are for continued full treatment and Full Code. If patient's condition worsens  and/or patient requires future hospitalization, then family may consider more palliative approach at that time - Patient is continued on Bactrim daily and azithromycin weekly to prevent opportunistic infections. - ID had discussed with family regarding risk and benefits of ARV. Decision made to start ARV with understanding that patient's condition may worsen or that neuro deficits may never improve despite ARV given concerns of possible anoxic brain injury. - thus far, patient remains stable    Encephalopathy acute Appears to be related to cryptococcal meningeal encephalitis vs anoxic brain injury.  - No significant change in condition has been noted - Patient continues to be non-verbal presently    AKI (acute kidney injury) ([place]) - Suspect presenting AKI secondary to dehydration - Labs were reviewed.  - Renal function improved    Protein-calorie malnutrition, severe - Patient is continued on supplements.  - Body mass index is 20.3 kg/m.  - remains stable at this time    Anemia of chronic disease/thrombocytopenia/Leukopenia - Hematology had been following.  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  - Continue to transfuse as needed. - Hgb had remained stable    3 full thickness wounds to bilat buttocks; 2X.5X.5cm, 1X1X1cm, 5.5X3X.2cm - Continue wound care per WOC. Per her notes: "Pt had I&D of buttock abscesses in May with the surgical team, according to the EMR. Thesewounds were present on admission but are NOT pressure injuries." - remains stable    Deep tissue injuries bilateral heels - Wound care per wound care team.  - Will continue with floating heels as tolerated    Hypokalemia - Potassium has been added to IV fluids - remains stable  Hyponatremia - Mild, monitor. - currently stable  DVT prophylaxis: SCD's Code Status: Full Family Communication: Pt in room Disposition Plan: Uncertain at this time  Consultants:    ID  Neurology  Palliative Care  Procedures:   Intermiitent LP's  Antimicrobials: Anti-infectives    Start     Dose/Rate Route Frequency Ordered Stop   03/06/17 1400  fluconazole (DIFLUCAN) IVPB 400 mg     400 mg 100 mL/hr over 120 Minutes Intravenous Every 24 hours 03/05/17 1519     03/03/17 1430  emtricitabine-tenofovir AF (DESCOVY) 200-25 MG per tablet 1 tablet     1 tablet Oral Daily 03/03/17 1324     03/03/17 1330  dolutegravir (TIVICAY) tablet 50 mg     50 mg Oral Daily 03/03/17 1324     03/03/17 1200  fluconazole (DIFLUCAN) tablet 400 mg  Status:  Discontinued     400 mg Oral Daily 03/03/17 1133 03/05/17 1519   02/22/17 2000  azithromycin (ZITHROMAX) tablet 1,200 mg     1,200 mg Oral Weekly 02/22/17 1812     02/22/17 2000  sulfamethoxazole-trimethoprim (BACTRIM DS,SEPTRA DS) 800-160 MG per tablet 1 tablet     1 tablet Oral Daily 02/22/17 1812     02/17/17 1400  levofloxacin (LEVAQUIN) IVPB 750 mg  Status:  Discontinued     750 mg 100 mL/hr over 90 Minutes Intravenous Every 48 hours 02/15/17 1515 02/16/17 1043   02/17/17 1000  azithromycin (ZITHROMAX) tablet 1,200 mg  Status:  Discontinued     1,200 mg Oral Every Fri 02/15/17 1615 02/16/17 1308   02/16/17 1330  ceFEPIme (MAXIPIME) 2 g in dextrose 5 % 50 mL IVPB  Status:  Discontinued     2 g 100 mL/hr over 30 Minutes Intravenous Every 24 hours 02/15/17 1515 02/16/17 1043   02/16/17 1200  fluconazole (DIFLUCAN) IVPB 400 mg  Status:  Discontinued     400 mg 100 mL/hr over 120 Minutes Intravenous Every 24 hours 02/16/17 1048 03/03/17 1133   02/16/17 1000  fluconazole (DIFLUCAN) tablet 400 mg  Status:  Discontinued     400 mg Oral Daily 02/15/17 1615 02/16/17 1048   02/16/17 1000  sulfamethoxazole-trimethoprim (BACTRIM DS,SEPTRA DS) 800-160 MG per tablet 1 tablet  Status:  Discontinued     1 tablet Oral Daily 02/15/17 1615 02/16/17 1308   02/16/17 0200  vancomycin (VANCOCIN) 500 mg in sodium chloride 0.9 % 100 mL IVPB   Status:  Discontinued     500 mg 100 mL/hr over 60 Minutes Intravenous Every 12 hours 02/15/17 1515 02/16/17 1043   02/15/17 1315  ceFEPIme (MAXIPIME) 2 g in dextrose 5 % 50 mL IVPB     2 g 100 mL/hr over 30 Minutes Intravenous  Once 02/15/17 1307 02/15/17 1436   02/15/17 1300  levofloxacin (LEVAQUIN) IVPB 750 mg     750 mg 100 mL/hr over 90 Minutes Intravenous  Once 02/15/17 1254 02/15/17 1533   02/15/17 1300  aztreonam (AZACTAM) 2 g in dextrose 5 % 50 mL IVPB  Status:  Discontinued     2 g 100 mL/hr over 30 Minutes Intravenous  Once 02/15/17 1254 02/15/17 1307   02/15/17 1300  vancomycin (VANCOCIN) IVPB 1000 mg/200 mL premix     1,000 mg 200 mL/hr over 60 Minutes Intravenous  Once 02/15/17 1254 02/15/17 1436      Subjective: Remains non-verbal  Objective: Vitals:   03/06/17 0538 03/06/17 0600 03/06/17 0630 03/06/17 0828  BP: 134/83     Pulse: 74  Resp: 17     Temp: (!) 96.5 F (35.8 C) (!) 97.5 F (36.4 C) 97.6 F (36.4 C) 97.6 F (36.4 C)  TempSrc: Axillary Oral Axillary Axillary  SpO2:      Weight:      Height:        Intake/Output Summary (Last 24 hours) at 03/06/17 0953 Last data filed at 03/06/17 9628  Gross per 24 hour  Intake              850 ml  Output             1250 ml  Net             -400 ml   Filed Weights   02/16/17 1407 03/02/17 0439 03/04/17 0458  Weight: 57.1 kg (125 lb 12.8 oz) 43.1 kg (95 lb) 34 kg (75 lb)    Examination: General exam: Non-verbal, in no acute distress Respiratory system: normal chest rise, clear, no audible wheezing Cardiovascular system: regular rhythm, s1-s2  Data Reviewed: I have personally reviewed following labs and imaging studies  CBC:  Recent Labs Lab 03/01/17 0325 03/04/17 0424 03/05/17 0358  WBC 2.5* 2.5* 3.1*  HGB 10.8* 11.2* 10.6*  HCT 33.9* 33.9* 32.8*  MCV 85.8 85.6 85.0  PLT 146* 158 366*   Basic Metabolic Panel:  Recent Labs Lab 03/01/17 0325 03/04/17 0424 03/05/17 0358  NA 132* 130*  133*  K 3.9 3.9 3.9  CL 104 105 107  CO2 21* 18* 21*  GLUCOSE 85 91 87  BUN 12 13 14   CREATININE 0.48* 0.54* 0.60*  CALCIUM 8.8* 8.9 9.0   GFR: Estimated Creatinine Clearance: 64.3 mL/min (A) (by C-G formula based on SCr of 0.6 mg/dL (L)). Liver Function Tests:  Recent Labs Lab 03/01/17 0325  AST 26  ALT 28  ALKPHOS 115  BILITOT 0.5  PROT 8.8*  ALBUMIN 2.6*   No results for input(s): LIPASE, AMYLASE in the last 168 hours. No results for input(s): AMMONIA in the last 168 hours. Coagulation Profile: No results for input(s): INR, PROTIME in the last 168 hours. Cardiac Enzymes: No results for input(s): CKTOTAL, CKMB, CKMBINDEX, TROPONINI in the last 168 hours. BNP (last 3 results) No results for input(s): PROBNP in the last 8760 hours. HbA1C: No results for input(s): HGBA1C in the last 72 hours. CBG: No results for input(s): GLUCAP in the last 168 hours. Lipid Profile: No results for input(s): CHOL, HDL, LDLCALC, TRIG, CHOLHDL, LDLDIRECT in the last 72 hours. Thyroid Function Tests: No results for input(s): TSH, T4TOTAL, FREET4, T3FREE, THYROIDAB in the last 72 hours. Anemia Panel: No results for input(s): VITAMINB12, FOLATE, FERRITIN, TIBC, IRON, RETICCTPCT in the last 72 hours. Sepsis Labs: No results for input(s): PROCALCITON, LATICACIDVEN in the last 168 hours.  Recent Results (from the past 240 hour(s))  CSF culture     Status: None   Collection Time: 03/02/17  8:51 AM  Result Value Ref Range Status   Specimen Description CSF  Final   Special Requests NONE  Final   Gram Stain   Final    CYTOSPIN SMEAR WBC PRESENT, PREDOMINANTLY MONONUCLEAR ENCAPSULATED YEAST CRITICAL RESULT CALLED TO, READ BACK BY AND VERIFIED WITH: S. Ward RN 10:05 03/02/17 ([email or web address])    Culture NO GROWTH 3 DAYS  Final   Report Status 03/05/2017 FINAL  Final  Fungus Culture With Stain     Status: None (Preliminary result)   Collection Time: 03/02/17  8:56 AM  Result Value Ref Range Status  Fungus Stain Final report  Final    Comment: (NOTE) Performed At: [facility] [place], [place] [id number] Lindon Romp MD [id number]    Fungus (Mycology) Culture PENDING  Incomplete   Fungal Source PENDING  Incomplete  Fungus Culture Result     Status: None   Collection Time: 03/02/17  8:56 AM  Result Value Ref Range Status   Result 1 Comment  Final    Comment: (NOTE) KOH/Calcofluor preparation:  no fungus observed. Performed At: [facility] [place], [place] [id number] Lindon Romp MD EV:[id number]      Radiology Studies: Ct Head Wo Contrast  Result Date: 03/04/2017 CLINICAL DATA:  31 y.o. 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 EXAM: CT HEAD WITHOUT CONTRAST TECHNIQUE: Contiguous axial images were obtained from the base of the skull through the vertex without intravenous contrast. COMPARISON:  Head CTs dated 02/15/2017 and 01/06/2017. Brain MRI dated 12/27/2016. FINDINGS: Brain: Patchy areas of low attenuation within the bilateral subcortical white matter regions, most prominent within the white matter of the left frontoparietal lobe, similar to the previous head CT of 02/15/2017 but progressed compared to the earlier head CT of 01/06/2017. Chronic appearing infarct again noted within the left lentiform nucleus. No parenchymal or extra-axial hemorrhage seen. Vascular: No hyperdense vessel or unexpected calcification. Skull: Normal. Negative for fracture or focal lesion. Sinuses/Orbits: No acute finding. Other: None. IMPRESSION: 1. Patchy low-density foci within the bilateral subcortical white matter regions, again most prominent within the white matter of the left frontoparietal lobe, similar to the appearance  on most recent head CT of 02/15/2017 but progressed compared to the earlier head CT of 01/06/2017, compatible with previous description of evolving infarcts related to underlying meningitis, now appearing subacute to chronic in age. Additional chronic appearing infarct within the left basal ganglia, stable. 2. No new/acute findings. No intracranial hemorrhage. No midline shift or herniation. Electronically Signed   By: Franki Cabot M.D.   On: 03/04/2017 12:27    Scheduled Meds: . amantadine  100 mg Oral BID WC  . azithromycin  1,200 mg Oral Weekly  . dolutegravir  50 mg Oral Daily  . emtricitabine-tenofovir AF  1 tablet Oral Daily  . feeding supplement (ENSURE ENLIVE)  237 mL Oral BID BM  . feeding supplement (PRO-STAT SUGAR FREE 64)  30 mL Oral BID  . lidocaine (PF)  5 mL Intradermal Once  . multivitamin with minerals  1 tablet Oral Daily  . nystatin  5 mL Oral QID  . polyvinyl alcohol  1 drop Both Eyes QID  . sulfamethoxazole-trimethoprim  1 tablet Oral Daily  . tamsulosin  0.4 mg Oral Daily   Continuous Infusions: . 0.9 % NaCl with KCl 40 mEq / L 50 mL/hr (03/05/17 2341)  . fluconazole (DIFLUCAN) IV       LOS: 19 days   CHIU, Orpah Melter, MD [facility] Pager [phone]  If 7PM-7AM, please contact night-coverage [email or web address] Password [email or web address] 03/06/2017, 9:53 AM

## 2017-03-06 NOTE — Social Work (Addendum)
CSW continuing to follow for return to SNF when medically stable.  Palliative following case and meet with mom on 03/07/17.  Elissa Hefty, LCSW Clinical Social Worker [phone]

## 2017-03-06 NOTE — Progress Notes (Signed)
Palliative Medicine RN Note: Rec'd call from mother Samwise Eckardt; she is unable to talk bc she is on the bus, and she is requesting to see Charlynn Court, PMT NP in the am. Judson Roch will be back then; will place this patient on her list.  Marjie Skiff. Bhavin Monjaraz, RN, BSN, [facility] 03/06/2017 10:45 AM Cell [phone] 8:00-4:00 Monday-Friday Office [phone]

## 2017-03-06 NOTE — Progress Notes (Signed)
Pt heart sustaining in 130 BP 152/84 MD notified

## 2017-03-06 NOTE — Progress Notes (Signed)
Subjective:  nonverbal   Antibiotics:  Anti-infectives    Start     Dose/Rate Route Frequency Ordered Stop   03/06/17 1400  fluconazole (DIFLUCAN) IVPB 400 mg     400 mg 100 mL/hr over 120 Minutes Intravenous Every 24 hours 03/05/17 1519     03/03/17 1430  emtricitabine-tenofovir AF (DESCOVY) 200-25 MG per tablet 1 tablet     1 tablet Oral Daily 03/03/17 1324     03/03/17 1330  dolutegravir (TIVICAY) tablet 50 mg     50 mg Oral Daily 03/03/17 1324     03/03/17 1200  fluconazole (DIFLUCAN) tablet 400 mg  Status:  Discontinued     400 mg Oral Daily 03/03/17 1133 03/05/17 1519   02/22/17 2000  azithromycin (ZITHROMAX) tablet 1,200 mg     1,200 mg Oral Weekly 02/22/17 1812     02/22/17 2000  sulfamethoxazole-trimethoprim (BACTRIM DS,SEPTRA DS) 800-160 MG per tablet 1 tablet     1 tablet Oral Daily 02/22/17 1812     02/17/17 1400  levofloxacin (LEVAQUIN) IVPB 750 mg  Status:  Discontinued     750 mg 100 mL/hr over 90 Minutes Intravenous Every 48 hours 02/15/17 1515 02/16/17 1043   02/17/17 1000  azithromycin (ZITHROMAX) tablet 1,200 mg  Status:  Discontinued     1,200 mg Oral Every Fri 02/15/17 1615 02/16/17 1308   02/16/17 1330  ceFEPIme (MAXIPIME) 2 g in dextrose 5 % 50 mL IVPB  Status:  Discontinued     2 g 100 mL/hr over 30 Minutes Intravenous Every 24 hours 02/15/17 1515 02/16/17 1043   02/16/17 1200  fluconazole (DIFLUCAN) IVPB 400 mg  Status:  Discontinued     400 mg 100 mL/hr over 120 Minutes Intravenous Every 24 hours 02/16/17 1048 03/03/17 1133   02/16/17 1000  fluconazole (DIFLUCAN) tablet 400 mg  Status:  Discontinued     400 mg Oral Daily 02/15/17 1615 02/16/17 1048   02/16/17 1000  sulfamethoxazole-trimethoprim (BACTRIM DS,SEPTRA DS) 800-160 MG per tablet 1 tablet  Status:  Discontinued     1 tablet Oral Daily 02/15/17 1615 02/16/17 1308   02/16/17 0200  vancomycin (VANCOCIN) 500 mg in sodium chloride 0.9 % 100 mL IVPB  Status:  Discontinued     500 mg 100  mL/hr over 60 Minutes Intravenous Every 12 hours 02/15/17 1515 02/16/17 1043   02/15/17 1315  ceFEPIme (MAXIPIME) 2 g in dextrose 5 % 50 mL IVPB     2 g 100 mL/hr over 30 Minutes Intravenous  Once 02/15/17 1307 02/15/17 1436   02/15/17 1300  levofloxacin (LEVAQUIN) IVPB 750 mg     750 mg 100 mL/hr over 90 Minutes Intravenous  Once 02/15/17 1254 02/15/17 1533   02/15/17 1300  aztreonam (AZACTAM) 2 g in dextrose 5 % 50 mL IVPB  Status:  Discontinued     2 g 100 mL/hr over 30 Minutes Intravenous  Once 02/15/17 1254 02/15/17 1307   02/15/17 1300  vancomycin (VANCOCIN) IVPB 1000 mg/200 mL premix     1,000 mg 200 mL/hr over 60 Minutes Intravenous  Once 02/15/17 1254 02/15/17 1436      Medications: Scheduled Meds: . amantadine  100 mg Oral BID WC  . azithromycin  1,200 mg Oral Weekly  . dolutegravir  50 mg Oral Daily  . emtricitabine-tenofovir AF  1 tablet Oral Daily  . feeding supplement (PRO-STAT SUGAR FREE 64)  30 mL Oral QID  . lidocaine (PF)  5 mL  Intradermal Once  . multivitamin with minerals  1 tablet Oral Daily  . nystatin  5 mL Oral QID  . polyvinyl alcohol  1 drop Both Eyes QID  . sulfamethoxazole-trimethoprim  1 tablet Oral Daily  . tamsulosin  0.4 mg Oral Daily   Continuous Infusions: . 0.9 % NaCl with KCl 40 mEq / L 50 mL/hr (03/05/17 2341)  . fluconazole (DIFLUCAN) IV Stopped (03/06/17 1515)   PRN Meds:.acetaminophen **OR** acetaminophen, HYDROcodone-acetaminophen, labetalol, polyethylene glycol, polyvinyl alcohol   Objective: Weight change:   Intake/Output Summary (Last 24 hours) at 03/06/17 1734 Last data filed at 03/06/17 1732  Gross per 24 hour  Intake          1839.17 ml  Output             1200 ml  Net           639.17 ml   Blood pressure (!) 152/84, pulse (!) 107, temperature 98.4 F (36.9 C), temperature source Axillary, resp. rate 17, height 5\' 6"  (1.676 m), weight 75 lb (34 kg), SpO2 100 %. Temp:  [96.5 F (35.8 C)-98.4 F (36.9 C)] 98.4 F (36.9  C) (08/06 1343) Pulse Rate:  [74-107] 107 (08/06 1343) Resp:  [17] 17 (08/06 1343) BP: (101-152)/(67-84) 152/84 (08/06 1707) SpO2:  [100 %] 100 % (08/06 1343)  Physical Exam: General: Alert and awake,non communicative,mother feeding him Neuro: contractures  CBC:  CBC Latest Ref Rng & Units 03/05/2017 03/04/2017 03/01/2017  WBC 4.0 - 10.5 K/uL 3.1(L) 2.5(L) 2.5(L)  Hemoglobin 13.0 - 17.0 g/dL 10.6(L) 11.2(L) 10.8(L)  Hematocrit 39.0 - 52.0 % 32.8(L) 33.9(L) 33.9(L)  Platelets 150 - 400 K/uL 136(L) 158 146(L)    BMET  Recent Labs  03/04/17 0424 03/05/17 0358  NA 130* 133*  K 3.9 3.9  CL 105 107  CO2 18* 21*  GLUCOSE 91 87  BUN 13 14  CREATININE 0.54* 0.60*  CALCIUM 8.9 9.0    Liver Panel  No results for input(s): PROT, ALBUMIN, AST, ALT, ALKPHOS, BILITOT, BILIDIR, IBILI in the last 72 hours.  Sedimentation Rate No results for input(s): ESRSEDRATE in the last 72 hours. C-Reactive Protein No results for input(s): CRP in the last 72 hours.  Micro Results: Recent Results (from the past 720 hour(s))  Culture, blood (Routine x 2)     Status: None   Collection Time: 02/15/17 12:45 PM  Result Value Ref Range Status   Specimen Description BLOOD LEFT ANTECUBITAL  Final   Special Requests   Final    BOTTLES DRAWN AEROBIC ONLY Blood Culture adequate volume   Culture NO GROWTH 5 DAYS  Final   Report Status 02/20/2017 FINAL  Final  Culture, blood (Routine x 2)     Status: None   Collection Time: 02/15/17 12:51 PM  Result Value Ref Range Status   Specimen Description BLOOD RIGHT ANTECUBITAL  Final   Special Requests IN PEDIATRIC BOTTLE Blood Culture adequate volume  Final   Culture NO GROWTH 5 DAYS  Final   Report Status 02/20/2017 FINAL  Final  CSF culture     Status: None   Collection Time: 02/16/17  1:29 PM  Result Value Ref Range Status   Specimen Description CSF  Final   Special Requests NONE  Final   Gram Stain   Final    WBC PRESENT, PREDOMINANTLY  PMN YEAST CYTOSPIN SMEAR CRITICAL RESULT CALLED TO, READ BACK BY AND VERIFIED WITH: Candie Chroman RN AT 1433 ON [id number] BY SJW  Culture NO GROWTH 3 DAYS  Final   Report Status 02/19/2017 FINAL  Final  Fungus Culture With Stain     Status: None (Preliminary result)   Collection Time: 02/16/17  1:29 PM  Result Value Ref Range Status   Fungus Stain Final report  Final    Comment: (NOTE) Performed At: [facility] [place], [place] [id number] Lindon Romp MD EL:[id number]    Fungus (Mycology) Culture PENDING  Incomplete   Fungal Source CSF  Final  Fungus Culture Result     Status: None   Collection Time: 02/16/17  1:29 PM  Result Value Ref Range Status   Result 1 Comment  Final    Comment: (NOTE) KOH/Calcofluor preparation:  no fungus observed. Performed At: [facility] [place], [place] [id number] Lindon Romp MD [id number]   MRSA PCR Screening     Status: None   Collection Time: 02/16/17  2:44 PM  Result Value Ref Range Status   MRSA by PCR NEGATIVE NEGATIVE Final    Comment:        The GeneXpert MRSA Assay (FDA approved for NASAL specimens only), is one component of a comprehensive MRSA colonization surveillance program. It is not intended to diagnose MRSA infection nor to guide or monitor treatment for MRSA infections.   CSF culture     Status: None   Collection Time: 02/18/17  2:00 PM  Result Value Ref Range Status   Specimen Description CSF  Final   Special Requests TUBE 2  Final   Gram Stain   Final    WBC PRESENT,BOTH PMN AND MONONUCLEAR YEAST CYTOSPIN SMEAR CRITICAL RESULT CALLED TO, READ BACK BY AND VERIFIED WITH: A. RODRIGUEZ RN, AT 0086 02/18/17 BY D. VANHOOK    Culture NO GROWTH 3 DAYS  Final   Report Status 02/21/2017 FINAL  Final  Culture, fungus without smear     Status: None (Preliminary result)   Collection Time: 02/18/17  2:00 PM  Result Value Ref Range Status   Specimen Description CSF  Final    Special Requests TUBE 2  Final   Culture NO FUNGUS ISOLATED AFTER 14 DAYS  Final   Report Status PENDING  Incomplete  CSF culture     Status: None   Collection Time: 02/22/17 11:43 AM  Result Value Ref Range Status   Specimen Description CSF  Final   Special Requests TUBE 2  Final   Gram Stain   Final    CYTOSPIN SMEAR WBC PRESENT, PREDOMINANTLY MONONUCLEAR YEAST CORRECTED RESULTS PREVIOUSLY REPORTED AS: NO ORGANISMS SEEN CORRECTED RESULTS CALLED TO: RN Gilman Buttner [id number] MLM    Culture NO GROWTH 3 DAYS  Final   Report Status 02/25/2017 FINAL  Final  CSF culture     Status: None   Collection Time: 03/02/17  8:51 AM  Result Value Ref Range Status   Specimen Description CSF  Final   Special Requests NONE  Final   Gram Stain   Final    CYTOSPIN SMEAR WBC PRESENT, PREDOMINANTLY MONONUCLEAR ENCAPSULATED YEAST CRITICAL RESULT CALLED TO, READ BACK BY AND VERIFIED WITH: S. Ward RN 10:05 03/02/17 ([email or web address])    Culture NO GROWTH 3 DAYS  Final   Report Status 03/05/2017 FINAL  Final  Fungus Culture With Stain     Status: None (Preliminary result)   Collection Time: 03/02/17  8:56 AM  Result Value Ref Range Status   Fungus Stain Final report  Final    Comment: (NOTE)  Performed At: [facility] [place], [place] [id number] Lindon Romp MD PV:[id number]    Fungus (Mycology) Culture PENDING  Incomplete   Fungal Source PENDING  Incomplete  Fungus Culture Result     Status: None   Collection Time: 03/02/17  8:56 AM  Result Value Ref Range Status   Result 1 Comment  Final    Comment: (NOTE) KOH/Calcofluor preparation:  no fungus observed. Performed At: [facility] [place], [place] [id number] Lindon Romp MD OF:[id number]     Studies/Results: No results found.    Assessment/Plan:  INTERVAL HISTORY:  Tolerating ARV   Principal Problem:   Sepsis ([place]) Active Problems:   AIDS (acquired immune deficiency syndrome)  (HCC)   Encephalopathy acute   Cryptococcal meningoencephalitis (HCC)   HIV (human immunodeficiency virus infection) ([place])   AKI (acute kidney injury) ([place])   Protein-calorie malnutrition, severe   Anemia of chronic disease   Acute encephalopathy   Goals of care, counseling/discussion   Palliative care by specialist   Leukopenia   Deep tissue injury bilateral heels   Wound of buttock   Hypokalemia   Encephalitis   Acute renal failure ([place])   Encephalopathy   Advance care planning    William Pena is a 31 y.o. male with  HIV/AIDS mx medical problems, perirectal abscess, cryptococcal pneumonia and meningoencephalitis requiring IVD, still with high opening pressures this admission, NEVER started on ARV due to fear for uncontrolled IRIS and ICP absent normalized ICP and absent mechanical means of controlling ICP (ie with shunt)  #1 Cryptococcal meningoencephalitis:  Opening pressure is normal when last checked   Continue fluconazole and ok to change to oral formulation  See below re the ARV  #2 Encephalopathy: IDEALLY I WOULD LIKE TO START ARV IF we are to be aggressive as it is possible that the HIV is directly affecting CNS and causing encephalopathy.  Certainly his high ICP could be playing a role and he MAY have suffered permanent Neuro insults  #3 HIV/AIDS  continue ARV and watch for IRIS  #4 Severe malnutrition: Dr. Baxter Flattery who cared for him extensively during his prior hospitalization at noted his extensive weight loss. She believes he is not eating very much at all. In talking to the mother the mother states that he only eats when she feeds him. I discussed these surgery Dr. Sherrian Divers who ordered nutrition consult I would also strongly consider placement of a PEG feeding tube during that time. That we are attempting to see if he can turn around clinically, ie while he is getting his ARV to suppress his virus, his antifungals, OPTIMAL nutrition should also be a peace of this  attempt to see improvement  I will otherwise sign off for now  Please call with further questions AND when pt nears DC so we can arrange HSFU      LOS: 19 days   William Pena 03/06/2017, 5:34 PM

## 2017-03-07 NOTE — Progress Notes (Signed)
Daily Progress Note   Patient Name: William Pena       Date: 03/07/2017 DOB: [birth year]  Age: 31 y.o. MRN#: [id number] Attending Physician: Donne Hazel, MD Primary Care Physician: [name], MD Admit Date: 02/15/2017  Reason for Consultation/Follow-up: Establishing goals of care, Interfamily conflict and Psychosocial/spiritual support  Subjective: Soul is slightly more lethargic today, however he does have increased left arm and left sided facial movement. He remains unresponsive and does not follow commands. Poor appetite this morning.   Length of Stay: 20  Current Medications: Scheduled Meds:  . amantadine  100 mg Oral BID WC  . azithromycin  1,200 mg Oral Weekly  . dolutegravir  50 mg Oral Daily  . emtricitabine-tenofovir AF  1 tablet Oral Daily  . feeding supplement (PRO-STAT SUGAR FREE 64)  30 mL Oral QID  . lidocaine (PF)  5 mL Intradermal Once  . multivitamin with minerals  1 tablet Oral Daily  . nystatin  5 mL Oral QID  . polyvinyl alcohol  1 drop Both Eyes QID  . sulfamethoxazole-trimethoprim  1 tablet Oral Daily  . tamsulosin  0.4 mg Oral Daily    Continuous Infusions: . 0.9 % NaCl with KCl 40 mEq / L 50 mL/hr (03/06/17 2043)  . fluconazole (DIFLUCAN) IV Stopped (03/06/17 1515)    PRN Meds: acetaminophen **OR** acetaminophen, HYDROcodone-acetaminophen, labetalol, polyethylene glycol, polyvinyl alcohol  Physical Exam   Constitution: Frail man lying in bed. HENT:  Head:  Mucous membranes moist. Right sided facial weakness, frequent movements on left side of face Nose: Nose normal.  Eyes:  Right pupil slightly larger than left. Will track me but seems to be staring through me rather than looking at me.  Neck: Normal range of motion. Neck supple.  Pulmonary/Chest: Effort normal. Breathing comfortably in  bed, no signs of distress.  Musculoskeletal:  Left arm with increased movement, though non-purposeful. Right arm contracted. No movement noted in BLE.  Significant muscle wasting.  Neurological:  Pt is awake though appears more lethargic today. His eyes are open and he will track me, but seems to be staring through me. Not speaking or following commands. Not blinking on command consistently. He does blink to threat. Skin: Skin is warm and dry.  Extensive tatoos  Psychiatric:  Calm in bed, otherwise non-interactive         Vital Signs: BP 125/78 (BP Location: Right Arm)   Pulse 98   Temp 98.3 F (36.8 C) (Axillary)   Resp 18   Ht '5\' 6"'  (1.676 m)   Wt 34 kg (75 lb) Comment: pillow.bedpad, flat sheet. diff from last wt. taken    SpO2 100%   BMI 12.11 kg/m  SpO2: SpO2: 100 % O2 Device: O2 Device: Not Delivered O2 Flow Rate: O2 Flow Rate (L/min): 2 L/min  Intake/output summary:   Intake/Output Summary (Last 24 hours) at 03/07/17 1005 Last data filed at 03/07/17 0830  Gross per 24 hour  Intake           1582.5 ml  Output             1700 ml  Net           -117.5 ml  LBM: Last BM Date: 03/06/17 Baseline Weight: Weight: 57.1 kg (125 lb 12.8 oz) Most recent weight: Weight: 34 kg (75 lb) (pillow.bedpad, flat sheet. diff from last wt. taken  )  Palliative Assessment/Data: PPS 30%   Flowsheet Rows     Most Recent Value  Intake Tab  Referral Department  Hospitalist  Unit at Time of Referral  ICU  Palliative Care Primary Diagnosis  Sepsis/Infectious Disease  Date Notified  02/21/17  Palliative Care Type  New Palliative care  Reason for referral  Clarify Goals of Care  Date of Admission  02/15/17  # of days IP prior to Palliative referral  6  Clinical Assessment  Psychosocial & Spiritual Assessment  Palliative Care Outcomes      Patient Active Problem List   Diagnosis Date Noted  . Encephalopathy   . Advance care planning   . Acute renal failure ([place])   . Hypokalemia  02/23/2017  . Encephalitis   . Leukopenia 02/22/2017  . Deep tissue injury bilateral heels 02/22/2017  . Wound of buttock 02/22/2017  . Goals of care, counseling/discussion   . Palliative care by specialist   . Sepsis ([place]) 02/16/2017  . Acute encephalopathy 02/15/2017  . Acute urinary retention 02/08/2017  . Genital herpes 02/08/2017  . Anemia of chronic disease 02/08/2017  . Constipation 02/08/2017  . Protein-calorie malnutrition, severe 01/28/2017  . Pressure injury of skin 01/16/2017  . AKI (acute kidney injury) ([place])   . HIV (human immunodeficiency virus infection) ([place])   . Meningitis   . 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 Acute blood loss anemia   . Elevated intracranial pressure   . HIV disease ([place])   . Lymphadenopathy of head and neck   . Rectal abscess   . Meningoencephalitis   . 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 Cavitary pneumonia   . Diffuse lymphadenopathy   . Drug rash   . Hypomagnesemia 12/23/2016  . Encephalopathy acute 12/23/2016  . SIRS (systemic inflammatory response syndrome) ([place]) 12/23/2016  . Acute respiratory failure ([place]) 12/23/2016  . Pulmonary infiltrates   . Perirectal abscess 12/21/2016  . Hyponatremia 12/21/2016  . Normocytic anemia 12/21/2016  . Elevated LFTs 12/21/2016  . AIDS (acquired immune deficiency syndrome) ([place]) 07/07/2016  . Chronic pain 06/20/2011    Palliative Care Assessment & Plan   HPI: 31 y.o. 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 He was recently here 9/89-2/1 with a complicated course due to disseminated cryptococcal infection and cryptococcal meningoencephalitis. He now presents from SNF with worsening encephalopathy and decreased oral intake. He was admitted on 02/15/2017. On admission he was was found  to be septic in the setting of recurrent cryptococcal meningitis; ID following. He also had increased ICP for which he is having serial LPs to monitor (VP shunt to be considered if ICPs climb again); Neurology consulted and advised. 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 Palliative consulted to assist family in clarifying goals of care given the patient's recurrent health issues and now significant failure to thrive.   Assessment: [name] obtained guardianship, with papers reviewed by my partner NP Mahan on 8/3. She has subsequently changed Jaycee's code status to DNR. Per our multiple conversations, she wants to continue the current full scope approach to care. If [place] should acutely worsen  or later have an issue that brings him back to the hospital, she will re-assess at that point to consider a more comfort focused approach.  I returned today to check-in with [name], at her request. We talked at length about the difference between an acute decline, versus a prolonged period of not-improving. She has been emotionally prepared to transition towards a comfort focus if things should acutely worsen, but is struggling more with the potential of him just not improving. Along those lines, we talked about considering how much time would be reasonable to see an improvement, and what are the metrics she is using to determine if he would choose to live a life at different degrees of debility. She is clear that she doesn't want him to suffer, but continues to struggle with when enough effort and time has been given to allow him to improve. Furthermore, while she does have guardianship, she does seek out a degree of consensus from the other members of his family, including the patient's father who is very focused on full scope care no matter what.   At this point she continues to express the desire for full scope  aggressive care. She continues to hope he will improve with more time and treatment. She plans to continue to consider limits on time and aggressiveness, but cannot verbalize any at present. In terms of aggressiveness, she would like to pursue a feeding tube if it is recommended to optimize his nutrition.   Recommendations/Plan:  DNR, otherwise full scope care, I spoke with Dietician and she does recommend a feeding tube if aggressive care is desired (pt's intake is highly labile and inadequate)  Goals of Care and Additional Recommendations:  Limitations on Scope of Treatment: Full Scope Treatment  Code Status:  DNR  Prognosis:   Unable to determine. Very poor prognosis overall.   Discharge Planning:  [place] for rehab with Palliative care service follow-up  Care plan was discussed with pt's mother and primary team  Thank you for allowing the Palliative Medicine Team to assist in the care of this patient.  Time in/out: 0930/1015 Total time: 65 minutes    Greater than 50%  of this time was spent counseling and coordinating care related to the above assessment and plan.  Charlynn Court, NP Palliative Medicine Team [phone] pager (7a-5p) Team Phone # [phone]

## 2017-03-07 NOTE — Progress Notes (Addendum)
PROGRESS NOTE    William Pena  WFU:[id number] DOB: 11-09-1985 DOA: 02/15/2017 PCP: [name], MD    Brief Narrative:  31 y.o. 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 Initial impression from ID was that he had relapsed cryptococcal meningitis.  Assessment & Plan:   Principal Problem:   Sepsis ([place]) Active Problems:   AIDS (acquired immune deficiency syndrome) (HCC)   Encephalopathy acute   Meningoencephalitis   HIV (human immunodeficiency virus infection) ([place])   AKI (acute kidney injury) ([place])   Protein-calorie malnutrition, severe   Anemia of chronic disease   Acute encephalopathy   Goals of care, counseling/discussion   Palliative care by specialist   Leukopenia   Deep tissue injury bilateral heels   Wound of buttock   Hypokalemia   Encephalitis   Acute renal failure (HCC)   Encephalopathy   Advance care planning  Principal Problem:   Sepsis ([name] to cryptococcal meningeal encephalitis Being treated for relapsed cryptococcal meningitis with fluconazole. Will need 8 weeks of therapy per ID (started 02/16/17 with stopping date 04/13/17), and will likely need 200 mg of fluconazole as oral therapy for another 10 months to equal 12 months of treatment. - LP done 8/2 with WBC of 37 and crypto in fluid aspirate.  - Patient now continued on PO fluconazole  - No longer septic  Active Problems:   AIDS (acquired immune deficiency syndrome) (HCC)/HIV infection -Had initially been unable to start antiretroviral therapy due to the risk of immune reconstitution inflammatory syndrome per ID. Palliative care consulted. - Per palliative care, wishes are for continued full treatment and Full Code. If patient's condition worsens and/or patient requires  future hospitalization, then family may consider more palliative approach at that time - Patient is continued on Bactrim daily and azithromycin weekly to prevent opportunistic infections. - ID had discussed with family regarding risk and benefits of ARV. Decision made to start ARV with understanding that patient's condition may worsen or that neuro deficits may never improve despite ARV given concerns of possible anoxic brain injury. - ID had since signed off as of 8/6. Final recommendations for PEG placement (see below) - Patient remains stable at present    Encephalopathy acute Appears to be related to cryptococcal meningeal encephalitis vs anoxic brain injury.  - No significant change in condition has been noted - Patient continues to be non-verbal and with staring spells    AKI (acute kidney injury) ([place]) - Suspect presenting AKI secondary to dehydration - Labs were reviewed.  - Renal function had improved    Protein-calorie malnutrition, severe - Patient is continued on supplements.  - Body mass index is 20.3 kg/m.  - Nutrition was consulted. Discussed case with Palliative Care and ID. Recommendations for alternative means of nutrition/tube feeding to maximize nutrition - Initially discussed with GI who recommended IR consultation. Have placed IR consult for PEG placement    Anemia of chronic disease/thrombocytopenia/Leukopenia - Hematology had been following.  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  - Continue to transfuse as needed. - Hgb has since remained stable    3 full thickness wounds to bilat buttocks; 2X.5X.5cm, 1X1X1cm, 5.5X3X.2cm - Continue wound care per WOC. Per her notes: "Pt had I&D of buttock abscesses in May with the surgical team, according to the EMR. Thesewounds were present on  admission but are NOT pressure injuries." - currently stable    Deep tissue injuries bilateral heels - Wound care per wound care team.  - Patient  continued with floating heels as tolerated    Hypokalemia - Potassium has been added to IV fluids - Currently stable    Hyponatremia - Mild, monitor. - remains stable  DVT prophylaxis: SCD's Code Status: Full Family Communication: Pt in room Disposition Plan: Uncertain at this time  Consultants:   ID  Neurology  Palliative Care  Procedures:   Intermiitent LP's  Antimicrobials: Anti-infectives    Start     Dose/Rate Route Frequency Ordered Stop   03/06/17 1400  fluconazole (DIFLUCAN) IVPB 400 mg     400 mg 100 mL/hr over 120 Minutes Intravenous Every 24 hours 03/05/17 1519     03/03/17 1430  emtricitabine-tenofovir AF (DESCOVY) 200-25 MG per tablet 1 tablet     1 tablet Oral Daily 03/03/17 1324     03/03/17 1330  dolutegravir (TIVICAY) tablet 50 mg     50 mg Oral Daily 03/03/17 1324     03/03/17 1200  fluconazole (DIFLUCAN) tablet 400 mg  Status:  Discontinued     400 mg Oral Daily 03/03/17 1133 03/05/17 1519   02/22/17 2000  azithromycin (ZITHROMAX) tablet 1,200 mg     1,200 mg Oral Weekly 02/22/17 1812     02/22/17 2000  sulfamethoxazole-trimethoprim (BACTRIM DS,SEPTRA DS) 800-160 MG per tablet 1 tablet     1 tablet Oral Daily 02/22/17 1812     02/17/17 1400  levofloxacin (LEVAQUIN) IVPB 750 mg  Status:  Discontinued     750 mg 100 mL/hr over 90 Minutes Intravenous Every 48 hours 02/15/17 1515 02/16/17 1043   02/17/17 1000  azithromycin (ZITHROMAX) tablet 1,200 mg  Status:  Discontinued     1,200 mg Oral Every Fri 02/15/17 1615 02/16/17 1308   02/16/17 1330  ceFEPIme (MAXIPIME) 2 g in dextrose 5 % 50 mL IVPB  Status:  Discontinued     2 g 100 mL/hr over 30 Minutes Intravenous Every 24 hours 02/15/17 1515 02/16/17 1043   02/16/17 1200  fluconazole (DIFLUCAN) IVPB 400 mg  Status:  Discontinued     400 mg 100 mL/hr over 120 Minutes Intravenous Every 24 hours 02/16/17 1048 03/03/17 1133   02/16/17 1000  fluconazole (DIFLUCAN) tablet 400 mg  Status:  Discontinued      400 mg Oral Daily 02/15/17 1615 02/16/17 1048   02/16/17 1000  sulfamethoxazole-trimethoprim (BACTRIM DS,SEPTRA DS) 800-160 MG per tablet 1 tablet  Status:  Discontinued     1 tablet Oral Daily 02/15/17 1615 02/16/17 1308   02/16/17 0200  vancomycin (VANCOCIN) 500 mg in sodium chloride 0.9 % 100 mL IVPB  Status:  Discontinued     500 mg 100 mL/hr over 60 Minutes Intravenous Every 12 hours 02/15/17 1515 02/16/17 1043   02/15/17 1315  ceFEPIme (MAXIPIME) 2 g in dextrose 5 % 50 mL IVPB     2 g 100 mL/hr over 30 Minutes Intravenous  Once 02/15/17 1307 02/15/17 1436   02/15/17 1300  levofloxacin (LEVAQUIN) IVPB 750 mg     750 mg 100 mL/hr over 90 Minutes Intravenous  Once 02/15/17 1254 02/15/17 1533   02/15/17 1300  aztreonam (AZACTAM) 2 g in dextrose 5 % 50 mL IVPB  Status:  Discontinued     2 g 100 mL/hr over 30 Minutes Intravenous  Once 02/15/17 1254 02/15/17 1307   02/15/17 1300  vancomycin (VANCOCIN) IVPB 1000  mg/200 mL premix     1,000 mg 200 mL/hr over 60 Minutes Intravenous  Once 02/15/17 1254 02/15/17 1436      Subjective: Nonverbal this AM  Objective: Vitals:   03/06/17 1707 03/06/17 2209 03/07/17 0520 03/07/17 1402  BP: (!) 152/84 120/67 125/78 104/71  Pulse:  (!) 109 98 98  Resp:  18 18 18   Temp:  97.8 F (36.6 C) 98.3 F (36.8 C) 98 F (36.7 C)  TempSrc:  Axillary Axillary   SpO2:  100% 100% 100%  Weight:      Height:        Intake/Output Summary (Last 24 hours) at 03/07/17 1450 Last data filed at 03/07/17 1400  Gross per 24 hour  Intake           1582.5 ml  Output              900 ml  Net            682.5 ml   Filed Weights   02/16/17 1407 03/02/17 0439 03/04/17 0458  Weight: 57.1 kg (125 lb 12.8 oz) 43.1 kg (95 lb) 34 kg (75 lb)    Examination: General exam: Awake, Sitting in bed, in nad Respiratory system: Normal respiratory effort, no wheezing Cardiovascular system: regular rate, s1, s2 Gastrointestinal system: Soft, nondistended, positive  BS Central nervous system: CN2-12 grossly intact, strength intact Extremities: Perfused, no clubbing Skin: Normal skin turgor, no notable skin lesions seen Psychiatry: difficult to assess given non-verbal state  Data Reviewed: I have personally reviewed following labs and imaging studies  CBC:  Recent Labs Lab 03/01/17 0325 03/04/17 0424 03/05/17 0358  WBC 2.5* 2.5* 3.1*  HGB 10.8* 11.2* 10.6*  HCT 33.9* 33.9* 32.8*  MCV 85.8 85.6 85.0  PLT 146* 158 786*   Basic Metabolic Panel:  Recent Labs Lab 03/01/17 0325 03/04/17 0424 03/05/17 0358  NA 132* 130* 133*  K 3.9 3.9 3.9  CL 104 105 107  CO2 21* 18* 21*  GLUCOSE 85 91 87  BUN 12 13 14   CREATININE 0.48* 0.54* 0.60*  CALCIUM 8.8* 8.9 9.0   GFR: Estimated Creatinine Clearance: 64.3 mL/min (A) (by C-G formula based on SCr of 0.6 mg/dL (L)). Liver Function Tests:  Recent Labs Lab 03/01/17 0325  AST 26  ALT 28  ALKPHOS 115  BILITOT 0.5  PROT 8.8*  ALBUMIN 2.6*   No results for input(s): LIPASE, AMYLASE in the last 168 hours. No results for input(s): AMMONIA in the last 168 hours. Coagulation Profile: No results for input(s): INR, PROTIME in the last 168 hours. Cardiac Enzymes: No results for input(s): CKTOTAL, CKMB, CKMBINDEX, TROPONINI in the last 168 hours. BNP (last 3 results) No results for input(s): PROBNP in the last 8760 hours. HbA1C: No results for input(s): HGBA1C in the last 72 hours. CBG: No results for input(s): GLUCAP in the last 168 hours. Lipid Profile: No results for input(s): CHOL, HDL, LDLCALC, TRIG, CHOLHDL, LDLDIRECT in the last 72 hours. Thyroid Function Tests: No results for input(s): TSH, T4TOTAL, FREET4, T3FREE, THYROIDAB in the last 72 hours. Anemia Panel: No results for input(s): VITAMINB12, FOLATE, FERRITIN, TIBC, IRON, RETICCTPCT in the last 72 hours. Sepsis Labs: No results for input(s): PROCALCITON, LATICACIDVEN in the last 168 hours.  Recent Results (from the past 240  hour(s))  CSF culture     Status: None   Collection Time: 03/02/17  8:51 AM  Result Value Ref Range Status   Specimen Description CSF  Final  Special Requests NONE  Final   Gram Stain   Final    CYTOSPIN SMEAR WBC PRESENT, PREDOMINANTLY MONONUCLEAR ENCAPSULATED YEAST CRITICAL RESULT CALLED TO, READ BACK BY AND VERIFIED WITH: S. Ward RN 10:05 03/02/17 ([email or web address])    Culture NO GROWTH 3 DAYS  Final   Report Status 03/05/2017 FINAL  Final  Fungus Culture With Stain     Status: None (Preliminary result)   Collection Time: 03/02/17  8:56 AM  Result Value Ref Range Status   Fungus Stain Final report  Final    Comment: (NOTE) Performed At: [facility] [place], [place] [id number] Lindon Romp MD BR:[id number]    Fungus (Mycology) Culture PENDING  Incomplete   Fungal Source PENDING  Incomplete  Fungus Culture Result     Status: None   Collection Time: 03/02/17  8:56 AM  Result Value Ref Range Status   Result 1 Comment  Final    Comment: (NOTE) KOH/Calcofluor preparation:  no fungus observed. Performed At: [facility] [address] [place], [place] [id number] Lindon Romp MD WV:[id number]      Radiology Studies: No results found.  Scheduled Meds: . amantadine  100 mg Oral BID WC  . azithromycin  1,200 mg Oral Weekly  . dolutegravir  50 mg Oral Daily  . emtricitabine-tenofovir AF  1 tablet Oral Daily  . feeding supplement (PRO-STAT SUGAR FREE 64)  30 mL Oral QID  . lidocaine (PF)  5 mL Intradermal Once  . multivitamin with minerals  1 tablet Oral Daily  . nystatin  5 mL Oral QID  . polyvinyl alcohol  1 drop Both Eyes QID  . sulfamethoxazole-trimethoprim  1 tablet Oral Daily  . tamsulosin  0.4 mg Oral Daily   Continuous Infusions: . 0.9 % NaCl with KCl 40 mEq / L 50 mL/hr (03/06/17 2043)  . fluconazole (DIFLUCAN) IV 400 mg (03/07/17 1316)     LOS: 20 days   Brianca Fortenberry, Orpah Melter, MD [facility] Pager [phone]  If 7PM-7AM,  please contact night-coverage [email or web address] Password [email or web address] 03/07/2017, 2:50 PM

## 2017-03-07 NOTE — Progress Notes (Signed)
Nutrition Follow-up  DOCUMENTATION CODES:   Underweight, Severe malnutrition in context of chronic illness  INTERVENTION:   -Continue MVI daily -Continue 30 ml Prostat to QID, each supplement provides 100 kcals and 15 grams protein -Continue Mighty Shake TID with meals  -If full scope aggressive care continues to be desired and feeding tube is placed, recommend:   Initiate Jevity 1.5 @ 25 ml/hr and increase by 10 ml every 12 hours to goal rate of 35 ml/hr.   30 ml Prostat TID.    Tube feeding regimen provides 1560 kcal (100% of needs), 99 grams of protein, and 633 ml of H2O.   -Recommend monitoring K, Mg, and Phos daily x 3 days, replete as necessary, and monitor for signs of refeeding syndrome  -Pt may benefit from temporary feeding tube (cortrak) for short term nutrition support vs permanent feeding access (ex PEG) while goals of care continue to be pursued  NUTRITION DIAGNOSIS:   Malnutrition (severe) related to chronic illness (AIDS) as evidenced by severe depletion of body fat, severe depletion of muscle mass, percent weight loss.  Ongoing  GOAL:   Patient will meet greater than or equal to 90% of their needs  Progressing  MONITOR:   PO intake, Supplement acceptance, Labs, Weight trends, Skin, I & O's  REASON FOR ASSESSMENT:   Consult Assessment of nutrition requirement/status  ASSESSMENT:   31 yo Male with PMH of AIDS, recent admission for perirectal abscess, development of cryptococcal meningoencephalitis, recent CVA,  ARF, severe protein calorie malnutrition who presents to the ED from facility with increased mental status change. Patient was in usual state of health. On recent discharge (dated 7/9), patient was recommended by ID to continue fluconazole x 4 more weeks before resuming antiviral meds for HIV. Patient is non-verbal at baseline. Patient's mother reported patient's poor PO intake recently.  Paged by Palliative NP Charlynn Court) and case discussed  with her. She reports that pt mother is still struggling with goals of care and would still like to pursue with full scope aggressive care. Pt mother would like to pursue feeding tube to optimize nutrition.   Reviewed ID note on 03/06/17, who reports "attempting to see if he can turn around clinically, ie while he is getting his ARV to suppress his virus, his antifungals". ID would like to optimize nutrition status in attempt to see if pt can improve.   Intake remains extremely variable (PO: 0-100%). Pt is fed by mother when she visits and pt will eat when fed by mother, however, eats very little when assisted by staff. Pt taking both Prostat and MVI supplements. However, if aggressive care continues to be desired, pt would best benefit from enteral nutrition support, due to pt's highly variable intake and severe malnutrition.   Labs reviewed: Na: 133 (on IV supplementation).   Diet Order:  Diet regular Room service appropriate? Yes; Fluid consistency: Thin  Skin:  Wound (see comment) (3 FT wounds to bilateral buttocks, DPTI rt/lt heels)  Last BM:  03/06/17  Height:   Ht Readings from Last 1 Encounters:  02/17/17 5\' 6"  (1.676 m)    Weight:   Wt Readings from Last 1 Encounters:  03/04/17 75 lb (34 kg)    Ideal Body Weight:  75.4 kg  BMI:  Body mass index is 12.11 kg/m.  Estimated Nutritional Needs:   Kcal:  1400-1600  Protein:  85-100 grams  Fluid:  > 1.4 L  EDUCATION NEEDS:   No education needs identified at this time  Miloh Alcocer A. Jimmye Norman, RD, LDN, CDE Pager: [phone] After hours Pager: [phone]

## 2017-03-08 LAB — BASIC METABOLIC PANEL
ANION GAP: 10 (ref 5–15)
BUN: 13 mg/dL (ref 6–20)
CO2: 17 mmol/L — AB (ref 22–32)
Calcium: 8.9 mg/dL (ref 8.9–10.3)
Chloride: 107 mmol/L (ref 101–111)
Creatinine, Ser: 0.64 mg/dL (ref 0.61–1.24)
GLUCOSE: 86 mg/dL (ref 65–99)
POTASSIUM: 4.1 mmol/L (ref 3.5–5.1)
Sodium: 134 mmol/L — ABNORMAL LOW (ref 135–145)

## 2017-03-08 LAB — CBC
HEMATOCRIT: 30.4 % — AB (ref 39.0–52.0)
HEMOGLOBIN: 9.8 g/dL — AB (ref 13.0–17.0)
MCH: 27.4 pg (ref 26.0–34.0)
MCHC: 32.2 g/dL (ref 30.0–36.0)
MCV: 84.9 fL (ref 78.0–100.0)
Platelets: 125 10*3/uL — ABNORMAL LOW (ref 150–400)
RBC: 3.58 MIL/uL — AB (ref 4.22–5.81)
RDW: 15.4 % (ref 11.5–15.5)
WBC: 7.3 10*3/uL (ref 4.0–10.5)

## 2017-03-08 MED ORDER — SULFAMETHOXAZOLE-TRIMETHOPRIM 800-160 MG PO TABS
1.0000 | ORAL_TABLET | Freq: Every day | ORAL | Status: DC
  Administered 2017-03-09 – 2017-03-15 (×7): 1
  Filled 2017-03-08 (×7): qty 1

## 2017-03-08 MED ORDER — NYSTATIN 100000 UNIT/ML MT SUSP
5.0000 mL | Freq: Four times a day (QID) | OROMUCOSAL | Status: DC
  Administered 2017-03-09 – 2017-03-16 (×29): 500000 [IU]
  Filled 2017-03-08 (×29): qty 5

## 2017-03-08 MED ORDER — POLYETHYLENE GLYCOL 3350 17 G PO PACK
17.0000 g | PACK | Freq: Every day | ORAL | Status: DC | PRN
  Administered 2017-03-15: 17 g
  Filled 2017-03-08: qty 1

## 2017-03-08 MED ORDER — AMANTADINE HCL 50 MG/5ML PO SYRP
100.0000 mg | ORAL_SOLUTION | Freq: Two times a day (BID) | ORAL | Status: DC
  Administered 2017-03-09 – 2017-03-16 (×15): 100 mg
  Filled 2017-03-08 (×16): qty 10

## 2017-03-08 MED ORDER — HYDROCODONE-ACETAMINOPHEN 5-325 MG PO TABS: 1.0000 | ORAL_TABLET | Freq: Four times a day (QID) | ORAL | Status: DC | PRN

## 2017-03-08 MED ORDER — PRO-STAT SUGAR FREE PO LIQD
30.0000 mL | Freq: Four times a day (QID) | ORAL | Status: DC
  Administered 2017-03-09: 30 mL
  Filled 2017-03-08: qty 30

## 2017-03-08 MED ORDER — ADULT MULTIVITAMIN LIQUID CH
15.0000 mL | Freq: Every day | ORAL | Status: DC
  Administered 2017-03-09 – 2017-03-16 (×8): 15 mL
  Filled 2017-03-08 (×8): qty 15

## 2017-03-08 NOTE — Progress Notes (Signed)
Cortrak Tube Team Note:  Consult received to place a Cortrak feeding tube.   A 10 F Cortrak tube was placed in the right nare and secured with a nasal bridle at 70 cm. Per the Cortrak monitor reading the tube tip is gastric.   No x-ray is required. RN may begin using tube.   If the tube becomes dislodged please keep the tube and contact the Cortrak team at [email or web address] (password [email or web address]) for replacement.  If after hours and replacement cannot be delayed, place a NG tube and confirm placement with an abdominal x-ray.    Maayan Jenning RD, LDN Clinical Nutrition Pager # - [phone]   

## 2017-03-08 NOTE — Progress Notes (Signed)
PROGRESS NOTE    William Pena  KGM:[id number] DOB: 1985/08/15 DOA: 02/15/2017 PCP: [name], MD    Brief Narrative:  31 y.o. 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 Initial impression from ID was that he had relapsed cryptococcal meningitis.  Assessment & Plan:   Principal Problem:   Sepsis ([place]) Active Problems:   AIDS (acquired immune deficiency syndrome) (HCC)   Encephalopathy acute   Meningoencephalitis   HIV (human immunodeficiency virus infection) ([place])   AKI (acute kidney injury) ([place])   Protein-calorie malnutrition, severe   Anemia of chronic disease   Acute encephalopathy   Goals of care, counseling/discussion   Palliative care by specialist   Leukopenia   Deep tissue injury bilateral heels   Wound of buttock   Hypokalemia   Encephalitis   Acute renal failure (HCC)   Encephalopathy   Advance care planning  Principal Problem:   Sepsis ([name] to cryptococcal meningeal encephalitis  -he was on oral dilfucan 400mg  daily since admission, this was changed to iv on 8/6 -Will need 8 weeks of iv diflucan 400mg  faily therapy per ID (started 02/16/17 with stopping date 04/13/17), and will likely need 200 mg of fluconazole as oral therapy for another 10 months to equal 12 months of treatment. Per last ID notes on 8/6, ok to discharge on oral diflucan 400mg  daily - LP done 8/2 with WBC of 37 and crypto in fluid aspirate.  -  No longer septic  Active Problems:   AIDS (acquired immune deficiency syndrome) (HCC)/HIV infection -Had initially been unable to start antiretroviral therapy due to the risk of immune reconstitution inflammatory syndrome per ID.  - Per palliative care, wishes are for continued full treatment and Full Code. If patient's  condition worsens and/or patient requires future hospitalization, then family may consider more palliative approach at that time - Patient is continued on Bactrim daily and azithromycin weekly to prevent opportunistic infections. - ID had discussed with family regarding risk and benefits of ARV. Decision made to start ARV with understanding that patient's condition may worsen or that neuro deficits may never improve despite ARV given concerns of possible anoxic brain injury. - ID had since signed off as of 8/6. Final recommendations for PEG placement (see below) - Patient remains stable at present    Encephalopathy acute Appears to be related to cryptococcal meningeal encephalitis vs anoxic brain injury.  - No significant change in condition has been noted - Patient continues to be non-verbal and with staring spells, he had left sided gaze    AKI (acute kidney injury) ([place]) - Suspect presenting AKI secondary to dehydration - Labs were reviewed. Cr peaked at 2.09,  - Renal function had improved, now wnl    Protein-calorie malnutrition, severe - Patient is continued on supplements.  - Body mass index is 20.3 kg/m.  - Nutrition was consulted. Discussed case with Palliative Care and ID. Recommendations for alternative means of nutrition/tube feeding to maximize nutrition - Initially discussed with GI who recommended IR consultation. IR declined  PEG placement due to risk overweight benefit, cortrack feeding tube recommended,  please see IR note on 8/8 -cortrack feeding tube, watch for refeeding syndrome, hopefully d/c to snf in 24-48hrs    Anemia of chronic disease/thrombocytopenia/Leukopenia - Hematology had been following.  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  - Continue to transfuse as needed. - Hgb has since remained stable    3 full thickness wounds to bilat buttocks; 2X.5X.5cm, 1X1X1cm, 5.5X3X.2cm - Continue wound care per WOC. Per her notes: "Pt had I&D of  buttock abscesses in May with the surgical team, according to the EMR. Thesewounds were present on admission but are NOT pressure injuries." - currently stable    Deep tissue injuries bilateral heels - Wound care per wound care team.  - Patient continued with floating heels as tolerated    Hypokalemia - Potassium has been added to IV fluids - Currently stable    Hyponatremia - Mild, monitor. - remains stable  I spent more than 50% time on coordination of care Case discussed with mother, RN and Interventional radiology at bedside, case discussed with palliative care over the phone on 8/8    DVT prophylaxis: SCD's Code Status: Full Family Communication: Pt in room, mother at bedside Disposition Plan: Uncertain at this time  Consultants:   ID  Neurology  Palliative Care  IR for peg tube placement  Nutrition  Social worker  Procedures:   Intermiitent LP's  Antimicrobials: Anti-infectives    Start     Dose/Rate Route Frequency Ordered Stop   03/06/17 1400  fluconazole (DIFLUCAN) IVPB 400 mg     400 mg 100 mL/hr over 120 Minutes Intravenous Every 24 hours 03/05/17 1519     03/03/17 1430  emtricitabine-tenofovir AF (DESCOVY) 200-25 MG per tablet 1 tablet     1 tablet Oral Daily 03/03/17 1324     03/03/17 1330  dolutegravir (TIVICAY) tablet 50 mg     50 mg Oral Daily 03/03/17 1324     03/03/17 1200  fluconazole (DIFLUCAN) tablet 400 mg  Status:  Discontinued     400 mg Oral Daily 03/03/17 1133 03/05/17 1519   02/22/17 2000  azithromycin (ZITHROMAX) tablet 1,200 mg     1,200 mg Oral Weekly 02/22/17 1812     02/22/17 2000  sulfamethoxazole-trimethoprim (BACTRIM DS,SEPTRA DS) 800-160 MG per tablet 1 tablet     1 tablet Oral Daily 02/22/17 1812     02/17/17 1400  levofloxacin (LEVAQUIN) IVPB 750 mg  Status:  Discontinued     750 mg 100 mL/hr over 90 Minutes Intravenous Every 48 hours 02/15/17 1515 02/16/17 1043   02/17/17 1000  azithromycin (ZITHROMAX) tablet  1,200 mg  Status:  Discontinued     1,200 mg Oral Every Fri 02/15/17 1615 02/16/17 1308   02/16/17 1330  ceFEPIme (MAXIPIME) 2 g in dextrose 5 % 50 mL IVPB  Status:  Discontinued     2 g 100 mL/hr over 30 Minutes Intravenous Every 24 hours 02/15/17 1515 02/16/17 1043   02/16/17 1200  fluconazole (DIFLUCAN) IVPB 400 mg  Status:  Discontinued     400 mg 100 mL/hr over 120 Minutes Intravenous Every 24 hours 02/16/17 1048 03/03/17 1133   02/16/17 1000  fluconazole (DIFLUCAN) tablet 400 mg  Status:  Discontinued     400 mg Oral Daily 02/15/17 1615 02/16/17 1048   02/16/17 1000  sulfamethoxazole-trimethoprim (BACTRIM DS,SEPTRA DS) 800-160 MG per tablet 1 tablet  Status:  Discontinued     1 tablet Oral Daily 02/15/17 1615 02/16/17 1308   02/16/17 0200  vancomycin (VANCOCIN) 500 mg in sodium chloride 0.9 % 100 mL IVPB  Status:  Discontinued     500 mg 100 mL/hr over 60 Minutes Intravenous Every 12 hours 02/15/17 1515 02/16/17 1043   02/15/17 1315  ceFEPIme (  MAXIPIME) 2 g in dextrose 5 % 50 mL IVPB     2 g 100 mL/hr over 30 Minutes Intravenous  Once 02/15/17 1307 02/15/17 1436   02/15/17 1300  levofloxacin (LEVAQUIN) IVPB 750 mg     750 mg 100 mL/hr over 90 Minutes Intravenous  Once 02/15/17 1254 02/15/17 1533   02/15/17 1300  aztreonam (AZACTAM) 2 g in dextrose 5 % 50 mL IVPB  Status:  Discontinued     2 g 100 mL/hr over 30 Minutes Intravenous  Once 02/15/17 1254 02/15/17 1307   02/15/17 1300  vancomycin (VANCOCIN) IVPB 1000 mg/200 mL premix     1,000 mg 200 mL/hr over 60 Minutes Intravenous  Once 02/15/17 1254 02/15/17 1436      Subjective: Nonverbal this AM, open eyes spontaneously, left side gaze, Mother at bedside.  Objective: Vitals:   03/07/17 0520 03/07/17 1402 03/07/17 2247 03/08/17 0651  BP: 125/78 104/71 (!) 149/67 135/71  Pulse: 98 98 88 75  Resp: 18 18 17 17   Temp: 98.3 F (36.8 C) 98 F (36.7 C) 98.1 F (36.7 C) 97.8 F (36.6 C)  TempSrc: Axillary  Axillary   SpO2:  100% 100% 100% 100%  Weight:      Height:        Intake/Output Summary (Last 24 hours) at 03/08/17 4235 Last data filed at 03/07/17 2248  Gross per 24 hour  Intake              360 ml  Output             1050 ml  Net             -690 ml   Filed Weights   02/16/17 1407 03/02/17 0439 03/04/17 0458  Weight: 57.1 kg (125 lb 12.8 oz) 43.1 kg (95 lb) 34 kg (75 lb)    Examination: General exam: Awake,open eyes spontaneously, left side gaze, not following commands, nonverbal, malnourished, chronically ill appearing, does not appear in acute distress Respiratory system: Normal respiratory effort, no wheezing Cardiovascular system: regular rate, s1, s2 Gastrointestinal system: Soft, nondistended, positive BS, well healed midline scar from remote surgery Central nervous system: pen eyes spontaneously, left side gaze, not following commands, nonverbal, right upper arm contacted, left arm with some spontaneous movement, lower extremity with heal protactors on, not much movement noticed Extremities: contractures , no edema Skin: Normal skin turgor, no notable skin lesions seen Psychiatry: difficult to assess given non-verbal state  Data Reviewed: I have personally reviewed following labs and imaging studies  CBC:  Recent Labs Lab 03/04/17 0424 03/05/17 0358 03/08/17 0300  WBC 2.5* 3.1* 7.3  HGB 11.2* 10.6* 9.8*  HCT 33.9* 32.8* 30.4*  MCV 85.6 85.0 84.9  PLT 158 136* 361*   Basic Metabolic Panel:  Recent Labs Lab 03/04/17 0424 03/05/17 0358 03/08/17 0300  NA 130* 133* 134*  K 3.9 3.9 4.1  CL 105 107 107  CO2 18* 21* 17*  GLUCOSE 91 87 86  BUN 13 14 13   CREATININE 0.54* 0.60* 0.64  CALCIUM 8.9 9.0 8.9   GFR: Estimated Creatinine Clearance: 64.3 mL/min (by C-G formula based on SCr of 0.64 mg/dL). Liver Function Tests: No results for input(s): AST, ALT, ALKPHOS, BILITOT, PROT, ALBUMIN in the last 168 hours. No results for input(s): LIPASE, AMYLASE in the last 168  hours. No results for input(s): AMMONIA in the last 168 hours. Coagulation Profile: No results for input(s): INR, PROTIME in the last 168 hours. Cardiac Enzymes: No results  for input(s): CKTOTAL, CKMB, CKMBINDEX, TROPONINI in the last 168 hours. BNP (last 3 results) No results for input(s): PROBNP in the last 8760 hours. HbA1C: No results for input(s): HGBA1C in the last 72 hours. CBG: No results for input(s): GLUCAP in the last 168 hours. Lipid Profile: No results for input(s): CHOL, HDL, LDLCALC, TRIG, CHOLHDL, LDLDIRECT in the last 72 hours. Thyroid Function Tests: No results for input(s): TSH, T4TOTAL, FREET4, T3FREE, THYROIDAB in the last 72 hours. Anemia Panel: No results for input(s): VITAMINB12, FOLATE, FERRITIN, TIBC, IRON, RETICCTPCT in the last 72 hours. Sepsis Labs: No results for input(s): PROCALCITON, LATICACIDVEN in the last 168 hours.  Recent Results (from the past 240 hour(s))  CSF culture     Status: None   Collection Time: 03/02/17  8:51 AM  Result Value Ref Range Status   Specimen Description CSF  Final   Special Requests NONE  Final   Gram Stain   Final    CYTOSPIN SMEAR WBC PRESENT, PREDOMINANTLY MONONUCLEAR ENCAPSULATED YEAST CRITICAL RESULT CALLED TO, READ BACK BY AND VERIFIED WITH: S. Ward RN 10:05 03/02/17 ([email or web address])    Culture NO GROWTH 3 DAYS  Final   Report Status 03/05/2017 FINAL  Final  Fungus Culture With Stain     Status: None (Preliminary result)   Collection Time: 03/02/17  8:56 AM  Result Value Ref Range Status   Fungus Stain Final report  Final    Comment: (NOTE) Performed At: [facility] [place], [place] [id number] Lindon Romp MD HF:[id number]    Fungus (Mycology) Culture PENDING  Incomplete   Fungal Source PENDING  Incomplete  Fungus Culture Result     Status: None   Collection Time: 03/02/17  8:56 AM  Result Value Ref Range Status   Result 1 Comment  Final    Comment: (NOTE) KOH/Calcofluor  preparation:  no fungus observed. Performed At: [facility] [address] [place], [place] [id number] Lindon Romp MD OM:[id number]      Radiology Studies: No results found.  Scheduled Meds: . amantadine  100 mg Oral BID WC  . azithromycin  1,200 mg Oral Weekly  . dolutegravir  50 mg Oral Daily  . emtricitabine-tenofovir AF  1 tablet Oral Daily  . feeding supplement (PRO-STAT SUGAR FREE 64)  30 mL Oral QID  . lidocaine (PF)  5 mL Intradermal Once  . multivitamin with minerals  1 tablet Oral Daily  . nystatin  5 mL Oral QID  . polyvinyl alcohol  1 drop Both Eyes QID  . sulfamethoxazole-trimethoprim  1 tablet Oral Daily  . tamsulosin  0.4 mg Oral Daily   Continuous Infusions: . 0.9 % NaCl with KCl 40 mEq / L 50 mL/hr (03/06/17 2043)  . fluconazole (DIFLUCAN) IV Stopped (03/07/17 1516)     LOS: 21 days   Hillel Card, MD PhD [facility] Pager [phone]  If 7PM-7AM, please contact night-coverage [email or web address] Password [email or web address] 03/08/2017, 8:21 AM

## 2017-03-08 NOTE — Progress Notes (Signed)
IR aware of request for G-tube placement given malnutrition and non-verbal, lethargic state.  This patient has AIDS (not on anti-viral therapy) who has been admitted secondary to cryptococcal meningitis.  [name] currently being followed by palliative care medicine.  His mother has made him a DNR but still wants therapy at this time.  A request has been made for g-tube for nutrition.  Given his AIDS status as well as his systemic infection with cryptococcal meningitis, Dr. Laurence Ferrari feels that placement of an invasive tube, such as a g-tube, would be too high risk for infection.  At this time the recommendation would be for placement of an NGT, PANDA tube, etc for nutritional support and defer placement of a gastrostomy tube.  Job Holtsclaw E 8:22 AM 03/08/2017

## 2017-03-09 ENCOUNTER — Telehealth: Payer: Self-pay | Admitting: *Deleted

## 2017-03-09 LAB — BASIC METABOLIC PANEL
ANION GAP: 10 (ref 5–15)
BUN: 12 mg/dL (ref 6–20)
CALCIUM: 8.8 mg/dL — AB (ref 8.9–10.3)
CO2: 16 mmol/L — AB (ref 22–32)
CREATININE: 0.53 mg/dL — AB (ref 0.61–1.24)
Chloride: 107 mmol/L (ref 101–111)
Glucose, Bld: 72 mg/dL (ref 65–99)
Potassium: 4.9 mmol/L (ref 3.5–5.1)
SODIUM: 133 mmol/L — AB (ref 135–145)

## 2017-03-09 LAB — PHOSPHORUS: PHOSPHORUS: 4.2 mg/dL (ref 2.5–4.6)

## 2017-03-09 LAB — GLUCOSE, CAPILLARY
GLUCOSE-CAPILLARY: 107 mg/dL — AB (ref 65–99)
Glucose-Capillary: 93 mg/dL (ref 65–99)
Glucose-Capillary: 97 mg/dL (ref 65–99)

## 2017-03-09 LAB — MAGNESIUM: MAGNESIUM: 1.5 mg/dL — AB (ref 1.7–2.4)

## 2017-03-09 MED ORDER — SODIUM BICARBONATE 650 MG PO TABS
650.0000 mg | ORAL_TABLET | Freq: Two times a day (BID) | ORAL | Status: AC
  Administered 2017-03-09 – 2017-03-10 (×4): 650 mg via ORAL
  Filled 2017-03-09 (×4): qty 1

## 2017-03-09 MED ORDER — PRO-STAT SUGAR FREE PO LIQD
30.0000 mL | Freq: Three times a day (TID) | ORAL | Status: DC
  Administered 2017-03-09 – 2017-03-16 (×21): 30 mL
  Filled 2017-03-09 (×21): qty 30

## 2017-03-09 MED ORDER — FLUCONAZOLE 40 MG/ML PO SUSR
400.0000 mg | Freq: Every day | ORAL | Status: DC
  Administered 2017-03-10 – 2017-03-15 (×6): 400 mg
  Filled 2017-03-09 (×7): qty 10

## 2017-03-09 MED ORDER — MAGNESIUM SULFATE 2 GM/50ML IV SOLN
2.0000 g | Freq: Once | INTRAVENOUS | Status: AC
  Administered 2017-03-09: 2 g via INTRAVENOUS
  Filled 2017-03-09: qty 50

## 2017-03-09 MED ORDER — JEVITY 1.2 CAL PO LIQD
1000.0000 mL | ORAL | Status: DC
  Administered 2017-03-09 – 2017-03-15 (×5): 1000 mL
  Filled 2017-03-09 (×9): qty 1000

## 2017-03-09 MED ORDER — ENSURE ENLIVE PO LIQD
237.0000 mL | Freq: Two times a day (BID) | ORAL | Status: DC
  Administered 2017-03-09 – 2017-03-17 (×13): 237 mL via ORAL

## 2017-03-09 NOTE — Progress Notes (Addendum)
Daily Progress Note   Patient Name: William Pena       Date: 03/09/2017 DOB: 01/31/1986  Age: 31 y.o. MRN#: [id number] Attending Physician: Florencia Reasons, MD Primary Care Physician: [name], MD Admit Date: 02/15/2017  Reason for Consultation/Follow-up: Establishing goals of care, Interfamily conflict and Psychosocial/spiritual support  Subjective: William Pena remains unchanged from my prior visit. He now has a Cortrak feeding tube. Mother was at the bedside and called me to meet with her. See discussion in Assessment section below   Length of Stay: 22  Current Medications: Scheduled Meds:  . amantadine  100 mg Per Tube BID WC  . azithromycin  1,200 mg Oral Weekly  . dolutegravir  50 mg Oral Daily  . emtricitabine-tenofovir AF  1 tablet Oral Daily  . feeding supplement (PRO-STAT SUGAR FREE 64)  30 mL Per Tube QID  . lidocaine (PF)  5 mL Intradermal Once  . multivitamin  15 mL Per Tube Daily  . nystatin  5 mL Per Tube QID  . polyvinyl alcohol  1 drop Both Eyes QID  . sodium bicarbonate  650 mg Oral BID  . sulfamethoxazole-trimethoprim  1 tablet Per Tube Daily  . tamsulosin  0.4 mg Oral Daily    Continuous Infusions: . 0.9 % NaCl with KCl 40 mEq / L 50 mL/hr (03/08/17 1520)  . fluconazole (DIFLUCAN) IV Stopped (03/08/17 1849)    PRN Meds: acetaminophen **OR** acetaminophen, HYDROcodone-acetaminophen, labetalol, polyethylene glycol, polyvinyl alcohol  Physical Exam   Constitution: Frail man lying in bed. HENT:  Head:  Mucous membranes moist. Right sided facial weakness, occasional left sided non-purposeful facial movements Nose: Nose normal.  Cortrak present Eyes:  Right pupil slightly larger than left. Will track me intermittently but seems to be staring through me rather than looking at me.  Neck: Normal range of motion. Neck  supple.  Pulmonary/Chest: Effort normal. Breathing comfortably in bed, no signs of distress.  Musculoskeletal:  Left arm with non-purposeful movement. Right arm contracted. No movement noted in BLE. Significant muscle wasting.  Neurological:  Pt will rouse to verbal stimulation but quickly falls back asleep. Not speaking or following commands. Not blinking on command consistently. He does blink to threat. Skin: Skin is warm and dry.  Extensive tatoos  Psychiatric:  Calm in bed, otherwise non-interactive         Vital Signs: BP 101/73   Pulse 64   Temp 98.2 F (36.8 C)   Resp 20   Ht '5\' 6"'  (1.676 m)   Wt 34 kg (75 lb) Comment: pillow.bedpad, flat sheet. diff from last wt. taken    SpO2 100%   BMI 12.11 kg/m  SpO2: SpO2: 100 % O2 Device: O2 Device: Not Delivered O2 Flow Rate: O2 Flow Rate (L/min): 2 L/min  Intake/output summary:   Intake/Output Summary (Last 24 hours) at 03/09/17 1033 Last data filed at 03/09/17 0900  Gross per 24 hour  Intake              420 ml  Output             1001 ml  Net             -581 ml  LBM: Last BM Date: 03/08/17 Baseline Weight: Weight: 57.1 kg (125 lb 12.8 oz) Most recent weight: Weight: 34 kg (75 lb) (pillow.bedpad, flat sheet. diff from last wt. taken  )  Palliative Assessment/Data: PPS 30%   Flowsheet Rows     Most Recent Value  Intake Tab  Referral Department  Hospitalist  Unit at Time of Referral  ICU  Palliative Care Primary Diagnosis  Sepsis/Infectious Disease  Date Notified  02/21/17  Palliative Care Type  New Palliative care  Reason for referral  Clarify Goals of Care  Date of Admission  02/15/17  # of days IP prior to Palliative referral  6  Clinical Assessment  Psychosocial & Spiritual Assessment  Palliative Care Outcomes      Patient Active Problem List   Diagnosis Date Noted  . Encephalopathy   . Advance care planning   . Acute renal failure ([place])   . Hypokalemia 02/23/2017  . Encephalitis   . Leukopenia  02/22/2017  . Deep tissue injury bilateral heels 02/22/2017  . Wound of buttock 02/22/2017  . Goals of care, counseling/discussion   . Palliative care by specialist   . Sepsis ([place]) 02/16/2017  . Acute encephalopathy 02/15/2017  . Acute urinary retention 02/08/2017  . Genital herpes 02/08/2017  . Anemia of chronic disease 02/08/2017  . Constipation 02/08/2017  . Protein-calorie malnutrition, severe 01/28/2017  . Pressure injury of skin 01/16/2017  . AKI (acute kidney injury) ([place])   . HIV (human immunodeficiency virus infection) ([place])   . Meningitis   . 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 Acute blood loss anemia   . Elevated intracranial pressure   . HIV disease ([place])   . Lymphadenopathy of head and neck   . Rectal abscess   . Meningoencephalitis   . 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 Cavitary pneumonia   . Diffuse lymphadenopathy   . Drug rash   . Hypomagnesemia 12/23/2016  . Encephalopathy acute 12/23/2016  . SIRS (systemic inflammatory response syndrome) ([place]) 12/23/2016  . Acute respiratory failure ([place]) 12/23/2016  . Pulmonary infiltrates   . Perirectal abscess 12/21/2016  . Hyponatremia 12/21/2016  . Normocytic anemia 12/21/2016  . Elevated LFTs 12/21/2016  . AIDS (acquired immune deficiency syndrome) ([place]) 07/07/2016  . Chronic pain 06/20/2011    Palliative Care Assessment & Plan   HPI: 31 y.o. 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 He was recently here 8/25-0/5 with a complicated course due to disseminated cryptococcal infection and cryptococcal meningoencephalitis. He now presents from SNF with worsening encephalopathy and decreased oral intake. He was admitted on 02/15/2017. On admission he was was found to be septic in the setting of recurrent  cryptococcal meningitis; ID following. He also had increased ICP for which he is having serial LPs to monitor (VP shunt to be considered if ICPs climb again); Neurology consulted and advised. 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 Palliative consulted to assist family in clarifying goals of care given the patient's recurrent health issues and now significant failure to thrive.   Assessment: [name] obtained guardianship, with papers reviewed by my partner NP Mahan on 8/3. She has subsequently changed William Pena's code status to DNR. Per our multiple conversations, she wants to continue the current full scope approach to care. If William Pena should acutely worsen  or later have an issue that brings him back to the hospital, she will re-assess at that point to consider a more comfort focused approach.  I have been following peripherally and providing support and maintaining rapport. [name] called me yesterday asking to meet today. She was appreciative that the Cortrak had been placed, but does understand his nutrition status will not improve immediately and he still has a lot of barriers to achieving good health. She had thought more on our conversation about considering how much time would be reasonable to see an improvement, and what are the metrics she is using to determine if he would choose to live a life at different degrees of debility. She continues to struggle with the very real possibility that he will not decline, but rather just not improve. She is hopeful there will be a clear direction and is still very unsure what she should do, and when, with a static debilitated state. We talked through this a bit and I encouraged her to talk more with her pastor, who she has indicated in the past is a strong source of comfort and guidance for her.    Recommendations/Plan:  DNR, otherwise full scope care. I do not expect a  change in focus unless something acute occurs.  Disposition will now be the issue. I recognize placement will be difficult with Cortrak. ID strongly recommends supplemental feeding for optimizing nutrition. [name] not a candidate for PEG, per IR, due to his AIDS status and cryptococcal meningitis placing him at too high risk for infection.    Goals of Care and Additional Recommendations: Limitations on Scope of Treatment: Full Scope Treatment  Code Status:  DNR  Prognosis:   Unable to determine. Very poor prognosis overall.   Discharge Planning:  [facility] for rehab with Palliative care service follow-up  Care plan was discussed with pt's mother and primary team  Thank you for allowing the Palliative Medicine Team to assist in the care of this patient.  Total time: 25 minutes    Greater than 50%  of this time was spent counseling and coordinating care related to the above assessment and plan.  Charlynn Court, NP Palliative Medicine Team [phone] pager (7a-5p) Team Phone # [phone]

## 2017-03-09 NOTE — Progress Notes (Signed)
PROGRESS NOTE    William Pena  SFK:[id number] DOB: [birth year] DOA: 02/15/2017 PCP: [name], MD    Brief Narrative:  31 y.o. 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 Initial impression from ID was that he had relapsed cryptococcal meningitis.  Assessment & Plan:   Principal Problem:   Sepsis ([place]) Active Problems:   AIDS (acquired immune deficiency syndrome) (HCC)   Encephalopathy acute   Meningoencephalitis   HIV (human immunodeficiency virus infection) ([place])   AKI (acute kidney injury) ([place])   Protein-calorie malnutrition, severe   Anemia of chronic disease   Acute encephalopathy   Goals of care, counseling/discussion   Palliative care by specialist   Leukopenia   Deep tissue injury bilateral heels   Wound of buttock   Hypokalemia   Encephalitis   Acute renal failure (HCC)   Encephalopathy   Advance care planning  Principal Problem:   Sepsis ([name] to cryptococcal meningeal encephalitis  -he was on oral dilfucan 400mg  daily since admission, this was changed to iv on 8/6 due to poor oral intake -Will need 8 weeks of diflucan 400mg  therapy per ID (started 02/16/17 with stopping date 04/13/17), and will likely need 200 mg of oral fluconazole therapy for another 10 months to 12 months of treatment. Per last ID notes on 8/6, ok to discharge on oral diflucan 400mg  daily - LP done 8/2 with WBC of 37 and crypto in fluid aspirate.  -  No longer septic  Active Problems:   AIDS (acquired immune deficiency syndrome) (HCC)/HIV infection -Had initially been unable to start antiretroviral therapy due to the risk of immune reconstitution inflammatory syndrome per ID.  - Per palliative care, wishes are for continued full treatment and Full Code. If  patient's condition worsens and/or patient requires future hospitalization, then family may consider more palliative approach at that time - Patient is continued on Bactrim daily and azithromycin weekly to prevent opportunistic infections. - ID had discussed with family regarding risk and benefits of ARV. Decision made to start ARV with understanding that patient's condition may worsen or that neuro deficits may never improve despite ARV given concerns of possible anoxic brain injury. - ID had since signed off as of 8/6. Final recommendations from ID for PEG placement , plan to hold (see below) - Patient remains stable at present    Encephalopathy acute Appears to be related to cryptococcal meningeal encephalitis vs anoxic brain injury.  - No significant change in condition has been noted - Patient continues to be non-verbal and with staring spells, he had left sided gaze    AKI (acute kidney injury) ([place]) - Suspect presenting AKI secondary to dehydration - Labs were reviewed. Cr peaked at 2.09,  - Renal function had improved, now wnl    Protein-calorie malnutrition, severe - Patient is continued on supplements.  - Body mass index is 20.3 kg/m.  - Nutrition was consulted. Discussed case with Palliative Care and ID. Recommendations for alternative means of nutrition/tube feeding to maximize nutrition - Initially discussed with GI who recommended IR consultation. IR declined  PEG placement due to risk overweight benefit, cortrack feeding tube recommended,  please see IR note on 8/8 -cortrack feeding tube placed on 8/8, start tube feeds, nutrition consulted, watch for refeeding syndrome, hopefully d/c to snf in 24-48hrs, case Freight forwarder and social worker working  on placement. Speech eval result on 8/9 noted      Anemia of chronic disease/thrombocytopenia/Leukopenia - Hematology had been following.  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  - Continue to transfuse as  needed. - Hgb has since remained stable    3 full thickness wounds to bilat buttocks; 2X.5X.5cm, 1X1X1cm, 5.5X3X.2cm - Continue wound care per WOC. Per her notes: "Pt had I&D of buttock abscesses in May with the surgical team, according to the EMR. Thesewounds were present on admission but are NOT pressure injuries." - currently stable    Deep tissue injuries bilateral heels - Wound care per wound care team.  - Patient continued with floating heels as tolerated    Hypokalemia/hypomag - replace k/mag - repeat lab in am    Hyponatremia - Mild, monitor. - remains stable  I spent more than 50% time on coordination of care RN and Mother updated at bedside     DVT prophylaxis: SCD's Code Status: Full Family Communication: Pt in room, mother at bedside Disposition Plan: Uncertain at this time  Consultants:   ID  Neurology  Palliative Care  IR for peg tube placement  Nutrition  Social worker  Procedures:   Intermiitent LP's  Antimicrobials: Anti-infectives    Start     Dose/Rate Route Frequency Ordered Stop   03/09/17 2000  sulfamethoxazole-trimethoprim (BACTRIM DS,SEPTRA DS) 800-160 MG per tablet 1 tablet     1 tablet Per Tube Daily 03/08/17 2249     03/06/17 1400  fluconazole (DIFLUCAN) IVPB 400 mg     400 mg 100 mL/hr over 120 Minutes Intravenous Every 24 hours 03/05/17 1519     03/03/17 1430  emtricitabine-tenofovir AF (DESCOVY) 200-25 MG per tablet 1 tablet     1 tablet Oral Daily 03/03/17 1324     03/03/17 1330  dolutegravir (TIVICAY) tablet 50 mg     50 mg Oral Daily 03/03/17 1324     03/03/17 1200  fluconazole (DIFLUCAN) tablet 400 mg  Status:  Discontinued     400 mg Oral Daily 03/03/17 1133 03/05/17 1519   02/22/17 2000  azithromycin (ZITHROMAX) tablet 1,200 mg     1,200 mg Oral Weekly 02/22/17 1812     02/22/17 2000  sulfamethoxazole-trimethoprim (BACTRIM DS,SEPTRA DS) 800-160 MG per tablet 1 tablet  Status:  Discontinued     1 tablet Oral  Daily 02/22/17 1812 03/08/17 2249   02/17/17 1400  levofloxacin (LEVAQUIN) IVPB 750 mg  Status:  Discontinued     750 mg 100 mL/hr over 90 Minutes Intravenous Every 48 hours 02/15/17 1515 02/16/17 1043   02/17/17 1000  azithromycin (ZITHROMAX) tablet 1,200 mg  Status:  Discontinued     1,200 mg Oral Every Fri 02/15/17 1615 02/16/17 1308   02/16/17 1330  ceFEPIme (MAXIPIME) 2 g in dextrose 5 % 50 mL IVPB  Status:  Discontinued     2 g 100 mL/hr over 30 Minutes Intravenous Every 24 hours 02/15/17 1515 02/16/17 1043   02/16/17 1200  fluconazole (DIFLUCAN) IVPB 400 mg  Status:  Discontinued     400 mg 100 mL/hr over 120 Minutes Intravenous Every 24 hours 02/16/17 1048 03/03/17 1133   02/16/17 1000  fluconazole (DIFLUCAN) tablet 400 mg  Status:  Discontinued     400 mg Oral Daily 02/15/17 1615 02/16/17 1048   02/16/17 1000  sulfamethoxazole-trimethoprim (BACTRIM DS,SEPTRA DS) 800-160 MG per tablet 1 tablet  Status:  Discontinued     1 tablet Oral Daily 02/15/17 1615 02/16/17  1308   02/16/17 0200  vancomycin (VANCOCIN) 500 mg in sodium chloride 0.9 % 100 mL IVPB  Status:  Discontinued     500 mg 100 mL/hr over 60 Minutes Intravenous Every 12 hours 02/15/17 1515 02/16/17 1043   02/15/17 1315  ceFEPIme (MAXIPIME) 2 g in dextrose 5 % 50 mL IVPB     2 g 100 mL/hr over 30 Minutes Intravenous  Once 02/15/17 1307 02/15/17 1436   02/15/17 1300  levofloxacin (LEVAQUIN) IVPB 750 mg     750 mg 100 mL/hr over 90 Minutes Intravenous  Once 02/15/17 1254 02/15/17 1533   02/15/17 1300  aztreonam (AZACTAM) 2 g in dextrose 5 % 50 mL IVPB  Status:  Discontinued     2 g 100 mL/hr over 30 Minutes Intravenous  Once 02/15/17 1254 02/15/17 1307   02/15/17 1300  vancomycin (VANCOCIN) IVPB 1000 mg/200 mL premix     1,000 mg 200 mL/hr over 60 Minutes Intravenous  Once 02/15/17 1254 02/15/17 1436      Subjective: Remain Nonverbal , open eyes spontaneously, left side gaze, cortrack tube in nare. Mother at  bedside.  Objective: Vitals:   03/08/17 1501 03/08/17 2222 03/09/17 0535 03/09/17 0710  BP: (!) 141/75 (!) 150/69 (!) 91/48 101/73  Pulse: 66 68 64 64  Resp: 20 20 20    Temp:  98.8 F (37.1 C) 98.2 F (36.8 C)   TempSrc:      SpO2: 100% 98% 100%   Weight:      Height:        Intake/Output Summary (Last 24 hours) at 03/09/17 0818 Last data filed at 03/09/17 0538  Gross per 24 hour  Intake              100 ml  Output             1002 ml  Net             -902 ml   Filed Weights   02/16/17 1407 03/02/17 0439 03/04/17 0458  Weight: 57.1 kg (125 lb 12.8 oz) 43.1 kg (95 lb) 34 kg (75 lb)    Examination: General exam: Awake,open eyes spontaneously, left side gaze, not following commands, nonverbal, malnourished, chronically ill appearing, does not appear in acute distress, cortrack tube in nare. Respiratory system: Normal respiratory effort, no wheezing Cardiovascular system: regular rate, s1, s2 Gastrointestinal system: Soft, nondistended, positive BS, well healed midline scar from remote surgery Central nervous system: pen eyes spontaneously, left side gaze, not following commands, nonverbal, right upper arm contacted, left arm with some spontaneous movement, lower extremity with heal protactors on, not much movement noticed Extremities: contractures , no edema Skin: Normal skin turgor, no notable skin lesions seen Psychiatry: difficult to assess given non-verbal state  Data Reviewed: I have personally reviewed following labs and imaging studies  CBC:  Recent Labs Lab 03/04/17 0424 03/05/17 0358 03/08/17 0300  WBC 2.5* 3.1* 7.3  HGB 11.2* 10.6* 9.8*  HCT 33.9* 32.8* 30.4*  MCV 85.6 85.0 84.9  PLT 158 136* 638*   Basic Metabolic Panel:  Recent Labs Lab 03/04/17 0424 03/05/17 0358 03/08/17 0300 03/09/17 0445  NA 130* 133* 134* 133*  K 3.9 3.9 4.1 4.9  CL 105 107 107 107  CO2 18* 21* 17* 16*  GLUCOSE 91 87 86 72  BUN 13 14 13 12   CREATININE 0.54* 0.60* 0.64  0.53*  CALCIUM 8.9 9.0 8.9 8.8*  MG  --   --   --  1.5*  PHOS  --   --   --  4.2   GFR: Estimated Creatinine Clearance: 64.3 mL/min (A) (by C-G formula based on SCr of 0.53 mg/dL (L)). Liver Function Tests: No results for input(s): AST, ALT, ALKPHOS, BILITOT, PROT, ALBUMIN in the last 168 hours. No results for input(s): LIPASE, AMYLASE in the last 168 hours. No results for input(s): AMMONIA in the last 168 hours. Coagulation Profile: No results for input(s): INR, PROTIME in the last 168 hours. Cardiac Enzymes: No results for input(s): CKTOTAL, CKMB, CKMBINDEX, TROPONINI in the last 168 hours. BNP (last 3 results) No results for input(s): PROBNP in the last 8760 hours. HbA1C: No results for input(s): HGBA1C in the last 72 hours. CBG: No results for input(s): GLUCAP in the last 168 hours. Lipid Profile: No results for input(s): CHOL, HDL, LDLCALC, TRIG, CHOLHDL, LDLDIRECT in the last 72 hours. Thyroid Function Tests: No results for input(s): TSH, T4TOTAL, FREET4, T3FREE, THYROIDAB in the last 72 hours. Anemia Panel: No results for input(s): VITAMINB12, FOLATE, FERRITIN, TIBC, IRON, RETICCTPCT in the last 72 hours. Sepsis Labs: No results for input(s): PROCALCITON, LATICACIDVEN in the last 168 hours.  Recent Results (from the past 240 hour(s))  CSF culture     Status: None   Collection Time: 03/02/17  8:51 AM  Result Value Ref Range Status   Specimen Description CSF  Final   Special Requests NONE  Final   Gram Stain   Final    CYTOSPIN SMEAR WBC PRESENT, PREDOMINANTLY MONONUCLEAR ENCAPSULATED YEAST CRITICAL RESULT CALLED TO, READ BACK BY AND VERIFIED WITH: S. Ward RN 10:05 03/02/17 ([email or web address])    Culture NO GROWTH 3 DAYS  Final   Report Status 03/05/2017 FINAL  Final  Fungus Culture With Stain     Status: None (Preliminary result)   Collection Time: 03/02/17  8:56 AM  Result Value Ref Range Status   Fungus Stain Final report  Final    Comment: (NOTE) Performed At: [facility] [place], [place] [id number] Lindon Romp MD HY:[id number]    Fungus (Mycology) Culture PENDING  Incomplete   Fungal Source PENDING  Incomplete  Fungus Culture Result     Status: None   Collection Time: 03/02/17  8:56 AM  Result Value Ref Range Status   Result 1 Comment  Final    Comment: (NOTE) KOH/Calcofluor preparation:  no fungus observed. Performed At: [facility] [address] [place], [place] [id number] Lindon Romp MD MW:[id number]      Radiology Studies: No results found.  Scheduled Meds: . amantadine  100 mg Per Tube BID WC  . azithromycin  1,200 mg Oral Weekly  . dolutegravir  50 mg Oral Daily  . emtricitabine-tenofovir AF  1 tablet Oral Daily  . feeding supplement (PRO-STAT SUGAR FREE 64)  30 mL Per Tube QID  . lidocaine (PF)  5 mL Intradermal Once  . multivitamin  15 mL Per Tube Daily  . nystatin  5 mL Per Tube QID  . polyvinyl alcohol  1 drop Both Eyes QID  . sulfamethoxazole-trimethoprim  1 tablet Per Tube Daily  . tamsulosin  0.4 mg Oral Daily   Continuous Infusions: . 0.9 % NaCl with KCl 40 mEq / L 50 mL/hr (03/08/17 1520)  . fluconazole (DIFLUCAN) IV Stopped (03/08/17 1849)     LOS: 49 days   Teagen Mcleary, MD PhD [facility] Pager [phone]  If 7PM-7AM, please contact night-coverage [email or web address] Password [email or web address] 03/09/2017, 8:18 AM

## 2017-03-09 NOTE — Progress Notes (Signed)
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 He was recently here 3/79-0/2 with a complicated course due to disseminated cryptococcal infection and cryptococcal meningoencephalitis. Pt was seen by SLP at that time, intermtitently had poor arousal and intake but demonstrated tolerance of regluar textures and thin liquids by the end of his admission.  He now presents from SNF with worsening encephalopathy and decreased oral intake. He was admitted on 02/15/2017 but SLP was not consulted until 03/08/17. His mother reports he takes PO from her, but will not eat when others try to feed him. Palliative care has been involved, but mother still wants full scope of care and is considering a PEG tube, but there are some concerns about placement.   Type of Study Bedside Swallow Evaluation  Previous Swallow Assessment see HPI  Diet Prior to this Study NPO  Temperature Spikes Noted No  Respiratory Status Room air  History of Recent Intubation No  Behavior/Cognition Doesn't follow directions  Oral Cavity Assessment WFL  Oral Care Completed by SLP No  Oral Cavity - Dentition Adequate natural dentition  Vision Impaired for self-feeding  Self-Feeding Abilities Total assist  Patient Positioning Upright in bed  Baseline Vocal Quality Not observed  Volitional Cough Cognitively unable to elicit  Volitional Swallow Unable to elicit  Oral Motor/Sensory Function  Overall Oral Motor/Sensory Function Other (comment) (does not follow commands. possibly less movement on Right)  Thin Liquid  Thin Liquid Impaired  Presentation Cup;Straw  Oral Phase Impairments Poor awareness of bolus;Other (comment) (max  tactile cues needed)  Pharyngeal  Phase Impairments Cough - Immediate  Nectar Thick Liquid  Nectar Thick Liquid NT  Honey Thick Liquid  Honey Thick Liquid NT  Puree  Puree WFL  Presentation Spoon  Solid  Solid Impaired  Oral Phase Impairments Poor awareness of bolus (visual tactile cues needed)  SLP Assessment  Clinical Impression Statement (ACUTE ONLY) Pt demonstrates ability to swallow, though cognition impacts consistent ability to consume PO for nutritional support. The pt easily awakened during repositioning and fixed gave upward and to the left. When SLPs face passed into his visual field he intermittently tracked and focused attention to me. Pt could not utilize straw automatically, but when water touched his mouth or lips he responded appropriately and initaited a swallow. He also accepted 2 oz of puree and a bite of cracker. Functionally pt has capability to consume regular diet and thin liquids, but based on cognition and pts waxing and waning arousal, would not expect consistent or adequate intake. Recommend resuming a dysphagia 2 diet and thin liquids with ongoing consideration for long term nutrition per plan of care. Aspiration precautions and careful feeding also needed. Will f/u to discuss pt with mother who was not at bedside today.   SLP Visit Diagnosis Dysphagia, unspecified (R13.10)  Impact on safety and function Moderate aspiration risk  Other Related Risk Factors Cognitive impairment;Deconditioning  Swallow Evaluation Recommendations  SLP Diet Recommendations Dysphagia 2 (Fine chop);Thin liquid  Liquid Administration via Cup  Medication Administration Whole meds with puree (ok to try with liquids)  Supervision Full supervision/cueing for compensatory strategies;Staff to assist with self feeding  Compensations Minimize environmental distractions  Postural Changes Seated upright at 90 degrees  Treatment Plan  Oral Care Recommendations Oral care BID  Other  Recommendations Have oral suction available  Treatment Recommendations Therapy as outlined in treatment plan below  Follow up Recommendations Skilled Nursing facility  Speech Therapy Frequency (ACUTE ONLY) min 1 x/week  Treatment Duration 2 weeks  Prognosis  Prognosis for Safe Diet Advancement Fair  Barriers to Reach Goals Cognitive deficits  Individuals Consulted  Consulted and Agree with Results and Recommendations Patient unable/family or caregiver not available  SLP Time Calculation  SLP Start Time (ACUTE ONLY) 1440  SLP Stop Time (ACUTE ONLY) 1456  SLP Time Calculation (min) (ACUTE ONLY) 16 min  SLP Evaluations  $ SLP Speech Visit 1 Procedure  SLP Evaluations  $BSS Swallow 1 Procedure  $Swallowing Treatment 1 Procedure

## 2017-03-09 NOTE — Progress Notes (Signed)
CSW and RNCM advised by medical director that patient will not be able to be placed at SNF with NG tube.  Percell Locus Taffy Delconte LCSWA [phone]

## 2017-03-09 NOTE — Progress Notes (Addendum)
  Speech Language Pathology Treatment: Dysphagia  Patient Details Name: William Pena MRN: [id number] DOB: [birth year] Today's Date: 03/09/2017 Time: 1010-1030 SLP Time Calculation (min) (ACUTE ONLY): 20 min  Assessment / Plan / Recommendation Clinical Impression  SLP visited pt while Mother in the room. She provided some good background on his history over this hospital stay and how [name] eats and drinks and the strategies she uses. She expressed concern that staff often did not offer [name] food and drink, only she did, but she tried to be present for two meals a day. She reported he likes soups, grilled cheese, spaghetti. He often eats a full meal, but sometimes does not. SLP repositioned pt and offered sips of water from a cup, demonstrating tactile cueing and head positioning. Mom reports she often gives him spoon sips or sips from a medicine cup, but prior to this admission he was able to sit in a chair and was holding his own cup. SLP posted a sign with specific instructions for staff and checked in with RN to reinforce need for staff to offer meals and drinks in between meals with a goal of pt eventually resuming exclusive oral intake. Will follow for tolerance and to given further education as needed. Had initially planned to start a dys 2 diet, but will start a regular diet to allow mom to choose a greater variety of items from the menu.    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 He was recently here 3/35-4/5 with a complicated course due to disseminated cryptococcal infection and cryptococcal meningoencephalitis. Pt was seen by SLP at that time, intermtitently had poor arousal and intake but demonstrated tolerance of regluar textures and thin liquids by the  end of his admission.  He now presents from SNF with worsening encephalopathy and decreased oral intake. He was admitted on 02/15/2017 but SLP was not consulted until 03/08/17. His mother reports he takes PO from her, but will not eat when others try to feed him. Palliative care has been involved, but mother still wants full scope of care and is considering a PEG tube, but there are some concerns about placement.       SLP Plan  Continue with current plan of care       Recommendations  Diet recommendations: Regular;Thin liquid Liquids provided via: Cup;Teaspoon Medication Administration: Whole meds with puree Supervision: Staff to assist with self feeding;Full supervision/cueing for compensatory strategies Compensations: Minimize environmental distractions Postural Changes and/or Swallow Maneuvers: Seated upright 90 degrees                Oral Care Recommendations: Oral care BID Follow up Recommendations: Skilled Nursing facility SLP Visit Diagnosis: Dysphagia, unspecified (R13.10) Plan: Continue with current plan of care       GO               [facility], MA CCC-SLP [phone]  [name] 03/09/2017, 11:27 AM

## 2017-03-09 NOTE — Progress Notes (Signed)
Nutrition Follow-up  DOCUMENTATION CODES:   Underweight, Severe malnutrition in context of chronic illness  INTERVENTION:   Initiate Jevity 1.5 @ 25 ml/hr and increase by 10 ml every 12 hours to goal rate of 35 ml/hr.   30 ml Prostat TID.    Tube feeding regimen provides 1560 kcal (100% of needs), 99 grams of protein, and 633 ml of H2O.   -Recommend monitoring K, Mg, and Phos daily x 3 days, replete as necessary, and monitor for signs of refeeding syndrome  -Liquid MVI daily -Ensure Enlive po BID, each supplement provides 350 kcal and 20 grams of protein   NUTRITION DIAGNOSIS:   Malnutrition (severe) related to chronic illness (AIDS) as evidenced by severe depletion of body fat, severe depletion of muscle mass, percent weight loss.  Ongoing  GOAL:   Patient will meet greater than or equal to 90% of their needs  Progressing  MONITOR:   PO intake, Supplement acceptance, Labs, Weight trends, Skin, I & O's  REASON FOR ASSESSMENT:   Consult Enteral/tube feeding initiation and management  ASSESSMENT:   31 yo Male with PMH of AIDS, recent admission for perirectal abscess, development of cryptococcal meningoencephalitis, recent CVA,  ARF, severe protein calorie malnutrition who presents to the ED from facility with increased mental status change. Patient was in usual state of health. On recent discharge (dated 7/9), patient was recommended by ID to continue fluconazole x 4 more weeks before resuming antiviral meds for HIV. Patient is non-verbal at baseline. Patient's mother reported patient's poor PO intake recently.  8/8- s/p cortrak tube placement; tip of tube in stomach  Case discussed with RN; plan to start TF today. RD informed RN of TF orders.   Spoke with pt mother, [name], at bedside. She remembered this RD from prior admission. She reports pt's appetite continues to be variable related to alertness and pt mainly accepting food at meals only when she feeds him. She  reports that she tries to ensure pt eats well when she is here.   Pt mother very appreciative of cortrak tube placement. Educated pt mother on how pt will receive nutrition via TF and formula selected. Also informed mother of starting at a low rate with slow advancement to promote tolerance. Pt mother also requesting for pt have Ensure for comfort, as he enjoys it.   Palliative care continues to follow and goals of care discussions are ongoing.   Labs reviewed: Na: 133 (on IV supplementation), Mg: 1.5  Diet Order:  Diet regular Room service appropriate? Yes; Fluid consistency: Thin  Skin:  Wound (see comment) (3 FT wounds to bilateral buttocks, DPTI rt/lt heels)  Last BM:  03/08/17  Height:   Ht Readings from Last 1 Encounters:  02/17/17 5\' 6"  (1.676 m)    Weight:   Wt Readings from Last 1 Encounters:  03/04/17 75 lb (34 kg)    Ideal Body Weight:  75.4 kg  BMI:  Body mass index is 12.11 kg/m.  Estimated Nutritional Needs:   Kcal:  1400-1600  Protein:  85-100 grams  Fluid:  > 1.4 L  EDUCATION NEEDS:   No education needs identified at this time  Kassidi Elza A. Jimmye Norman, RD, LDN, CDE Pager: [phone] After hours Pager: [phone]

## 2017-03-09 NOTE — Consult Note (Addendum)
[facility] Nurse wound follow up Wound type: Re-consult requested to re-assess bilat heels. Pt had deep tissue injuries to bilat heels which were present on admission.  Right heel remains DTI with intact skin, no odor or drainage. Left inner ankle with healing stage 2 pressure injury; .5X.5X.1cm, pink and dry. Left heel with loose peeling skin,removed as described below.  Wound evolved to stage 3 after nonviable skin was removed; 7X6X.2cm, red and moist.  Mod amt pink drainage, no odor.  Mother at bedside to assess wounds and discuss plan of care.   Dressing procedure/placement/frequency: Pt has been wearing Prevalon boots.  Foam dressing to left inner ankle and right heel to protect from further injury.  Aquacel to absorb drainage and provide antimicrobial benefits to left heel.  Continue to float heels to reduce pressure.  Discussed plan of care with mother at the bedside. Conservative sharp wound debridement (CSWD performed at the bedside): Removed loose outer layer of skin using scissors and forceps.  Pt tolerated without pain or bleeding.   Please re-consult if further assistance is needed.  Thank-you,  Julien Girt MSN, [place], [place], [place], [place]

## 2017-03-09 NOTE — Telephone Encounter (Signed)
Patient mother called to advise that he was in the hospital and may not make it to his office visit 03/14/17. Advised her will leave it on the books just in case but will make a note.

## 2017-03-10 LAB — BASIC METABOLIC PANEL
ANION GAP: 8 (ref 5–15)
BUN: 15 mg/dL (ref 6–20)
CALCIUM: 8.6 mg/dL — AB (ref 8.9–10.3)
CO2: 20 mmol/L — ABNORMAL LOW (ref 22–32)
CREATININE: 0.44 mg/dL — AB (ref 0.61–1.24)
Chloride: 107 mmol/L (ref 101–111)
Glucose, Bld: 93 mg/dL (ref 65–99)
Potassium: 3.9 mmol/L (ref 3.5–5.1)
SODIUM: 135 mmol/L (ref 135–145)

## 2017-03-10 LAB — GLUCOSE, CAPILLARY
GLUCOSE-CAPILLARY: 92 mg/dL (ref 65–99)
GLUCOSE-CAPILLARY: 87 mg/dL (ref 65–99)
GLUCOSE-CAPILLARY: 88 mg/dL (ref 65–99)
Glucose-Capillary: 87 mg/dL (ref 65–99)
Glucose-Capillary: 90 mg/dL (ref 65–99)

## 2017-03-10 LAB — PHOSPHORUS: PHOSPHORUS: 3.8 mg/dL (ref 2.5–4.6)

## 2017-03-10 LAB — MAGNESIUM: MAGNESIUM: 1.6 mg/dL — AB (ref 1.7–2.4)

## 2017-03-10 MED ORDER — MAGNESIUM SULFATE 2 GM/50ML IV SOLN
2.0000 g | Freq: Once | INTRAVENOUS | Status: AC
  Administered 2017-03-10: 2 g via INTRAVENOUS
  Filled 2017-03-10: qty 50

## 2017-03-10 NOTE — Evaluation (Signed)
Physical Therapy Evaluation Patient Details Name: William Pena MRN: [id number] DOB: [birth year] Today's Date: 03/10/2017   History of Present Illness  Pt is a 31 y/o male admitted 7/18 from SNF for worsening confusion and sepsis. PMH includes HIV/AIDS, pressure wounds on heels and buttocks, perirectal abscess, cryptococcal menigioencephalopathy, AKI, and non verbal at baseline. Palliative care following; mother wishing to proceed with medical care vs. comfort. NG tube in place.   Clinical Impression  Pt admitted from SNF with problem above and deficits below. Pt nonverbal at baseline, so unable to determine baseline functioning. Pt not following commands and requiring total assist +2 for bed mobility. Required multiple cues to keep eyes open during session. Noted tone vs. Pt resistance in BUE and neck. When asked pt resistive to head turns, however, able to turn head in supine when not cued. Also noted same pattern in UE. Will pick up to follow for trial acute PT and will follow up with family to determine baseline functioning. Recommending return to SNF at d/c.     Follow Up Recommendations SNF    Equipment Recommendations  None recommended by PT    Recommendations for Other Services       Precautions / Restrictions Precautions Precautions: Fall Precaution Comments: NG tube Required Braces or Orthoses: Other Brace/Splint Other Brace/Splint: bilat PRAFO Restrictions Weight Bearing Restrictions: No      Mobility  Bed Mobility Overal bed mobility: Needs Assistance Bed Mobility: Supine to Sit;Sit to Supine     Supine to sit: Total assist;+2 for physical assistance Sit to supine: Total assist;+2 for physical assistance   General bed mobility comments: Total Assist +2 for bed mobility. Pt not initiating movement to assist with bed mobility.   Transfers                    Ambulation/Gait                Stairs            Wheelchair Mobility     Modified Rankin (Stroke Patients Only)       Balance Overall balance assessment: Needs assistance Sitting-balance support: No upper extremity supported;Feet supported Sitting balance-Leahy Scale: Zero Sitting balance - Comments: Sat EOB for ~15 min. Attempted to get pt to follow commands to move UE/LE, however, pt unable. No postural righting reactions when tested X3. Total assist to maintain balance. Tone vs. pt resistance in UE. Pt very resistive to OT passive movement of UE.                                      Pertinent Vitals/Pain Pain Assessment: Faces Faces Pain Scale: Hurts a little bit Pain Location: Generalized  Pain Descriptors / Indicators: Aching Pain Intervention(s): Limited activity within patient's tolerance;Monitored during session;Repositioned    Home Living Family/patient expects to be discharged to:: Skilled nursing facility                      Prior Function Level of Independence: Needs assistance         Comments: Was at SNF, however, no family present to determine baseline, and pt nonverbal. Per notes, reports pt was able to assist with OOB transfers. Will need to follow up.      Hand Dominance        Extremity/Trunk Assessment   Upper Extremity Assessment Upper Extremity Assessment: Defer to OT  evaluation    Lower Extremity Assessment Lower Extremity Assessment: RLE deficits/detail;LLE deficits/detail RLE Deficits / Details: Unable to move actively upon command. Decreased DF noted. Pressure wound on heel.  LLE Deficits / Details: Unable to move actively upon command. Decreased DF noted. Pressure wound on heel.        Communication   Communication: Expressive difficulties (non verbal)  Cognition Arousal/Alertness: Lethargic Behavior During Therapy: Flat affect Overall Cognitive Status: No family/caregiver present to determine baseline cognitive functioning                                 General  Comments: Non verbal at baseline. Pt requiring multiple cues to open eyes throughout session. Not following verbal commands during session.       General Comments General comments (skin integrity, edema, etc.): No family present during session. Will need to follow up with family to determine PLOF.     Exercises     Assessment/Plan    PT Assessment Patient needs continued PT services  PT Problem List Decreased strength;Decreased range of motion;Decreased activity tolerance;Decreased balance;Decreased mobility;Decreased coordination;Decreased cognition;Decreased knowledge of use of DME;Decreased safety awareness;Decreased knowledge of precautions;Impaired tone;Decreased skin integrity       PT Treatment Interventions Functional mobility training;Therapeutic activities;Therapeutic exercise;Balance training;Neuromuscular re-education;Cognitive remediation;Patient/family education    PT Goals (Current goals can be found in the Care Plan section)  Acute Rehab PT Goals Patient Stated Goal: not able to state  PT Goal Formulation: Patient unable to participate in goal setting Time For Goal Achievement: 03/17/17 Potential to Achieve Goals: Fair    Frequency Min 2X/week   Barriers to discharge        Co-evaluation PT/OT/SLP Co-Evaluation/Treatment: Yes Reason for Co-Treatment: Complexity of the patient's impairments (multi-system involvement);Necessary to address cognition/behavior during functional activity;For patient/therapist safety;To address functional/ADL transfers PT goals addressed during session: Mobility/safety with mobility;Balance;Proper use of DME         AM-PAC PT "6 Clicks" Daily Activity  Outcome Measure Difficulty turning over in bed (including adjusting bedclothes, sheets and blankets)?: Total Difficulty moving from lying on back to sitting on the side of the bed? : Total Difficulty sitting down on and standing up from a chair with arms (e.g., wheelchair, bedside  commode, etc,.)?: Total Help needed moving to and from a bed to chair (including a wheelchair)?: Total Help needed walking in hospital room?: Total Help needed climbing 3-5 steps with a railing? : Total 6 Click Score: 6    End of Session   Activity Tolerance: Patient tolerated treatment well Patient left: in bed;with call bell/phone within reach;with bed alarm set Nurse Communication: Mobility status PT Visit Diagnosis: Muscle weakness (generalized) (M62.81);Other symptoms and signs involving the nervous system (R29.898);Difficulty in walking, not elsewhere classified (R26.2)    Time: 6378-5885 PT Time Calculation (min) (ACUTE ONLY): 33 min   Charges:   PT Evaluation $PT Eval Moderate Complexity: 1 Mod     PT G Codes:        Leighton Ruff, PT, DPT  Acute Rehabilitation Services  Pager: [phone]   Rudean Hitt 03/10/2017, 1:50 PM

## 2017-03-10 NOTE — Evaluation (Signed)
Occupational Therapy Evaluation Patient Details Name: William Pena MRN: [id number] DOB: Feb 20, 1986 Today's Date: 03/10/2017    History of Present Illness Pt is a 31 y/o male admitted 7/18 from SNF for worsening confusion and sepsis. PMH includes HIV/AIDS, pressure wounds on heels and buttocks, perirectal abscess, cryptococcal menigioencephalopathy, AKI, and non verbal at baseline. Palliative care following; mother wishing to proceed with medical care vs. comfort. NG tube in place.    Clinical Impression   This 31 y/o M presents with the above. Pt is nonverbal, prior to admission was most recently at [facility]. According to staff Pt would intermittently attempt to feed himself, was otherwise requiring Max-Total assist +2 for ADLs and functional mobility. Baseline functioning prior to this stay is unclear, though in further chart review of previous admits Pt appeared to have increased mobility compared to CLOF. Pt requires Max multimodal cues to open eyes and keep eyes open during session, does not follow commands. Required total assist +2 for bed mobility and sitting EOB this session. Attempted PROM of UEs, however unclear as to whether Pt resisting movements or Pt with increased tone. Pt resisting turning head to L when sitting EOB, however when returns to supine turns head without assist. Will plan to follow acutely to maximize Pt's progress for mobility and ability to participate in ADLs.  Recommend SNF at discharge.     Follow Up Recommendations  SNF    Equipment Recommendations  Other (comment) (TBD in next venue of care )           Precautions / Restrictions Precautions Precautions: Fall Precaution Comments: NG tube Required Braces or Orthoses: Other Brace/Splint Other Brace/Splint: bilat PRAFO Restrictions Weight Bearing Restrictions: No      Mobility Bed Mobility Overal bed mobility: Needs Assistance Bed Mobility: Supine to Sit;Sit to Supine     Supine to sit: Total  assist;+2 for physical assistance Sit to supine: Total assist;+2 for physical assistance   General bed mobility comments: Total Assist +2 for bed mobility. Pt not initiating movement to assist with bed mobility.   Transfers                      Balance Overall balance assessment: Needs assistance Sitting-balance support: No upper extremity supported;Feet supported Sitting balance-Leahy Scale: Zero Sitting balance - Comments: Sat EOB for ~15 min. Attempted to get pt to follow commands to move UE/LE, however, pt unable. No postural righting reactions when tested X3. Total assist to maintain balance. Tone vs. pt resistance in UE. Pt very resistive to OT passive movement of UE.                                    ADL either performed or assessed with clinical judgement   ADL Overall ADL's : Needs assistance/impaired Eating/Feeding: Total assistance;Bed level Eating/Feeding Details (indicate cue type and reason): NG tube  Grooming: Total assistance;Bed level   Upper Body Bathing: Total assistance;Bed level   Lower Body Bathing: Total assistance;Bed level   Upper Body Dressing : Total assistance;Bed level   Lower Body Dressing: Total assistance;Bed level       Toileting- Clothing Manipulation and Hygiene: Total assistance;Bed level Toileting - Clothing Manipulation Details (indicate cue type and reason): foley      Functional mobility during ADLs: Total assistance;+2 for physical assistance;+2 for safety/equipment       Vision   Additional Comments: Max multimodal cues  for Pt to open eyes and keep eyes open during session; Pt intermittently makes eye contact, very minimally tracks when asking Pt to find hand moving on either side of periphery                 Pertinent Vitals/Pain Pain Assessment: Faces Faces Pain Scale: Hurts a little bit Pain Location: Generalized  Pain Descriptors / Indicators: Grimacing Pain Intervention(s): Limited activity  within patient's tolerance;Monitored during session;Repositioned          Extremity/Trunk Assessment Upper Extremity Assessment Upper Extremity Assessment: RUE deficits/detail;LUE deficits/detail RUE Deficits / Details: Pt unable to move upon command, bil hands holding washcloth -passively able to extend digits; attempted further UE PROM, Pt allows PROM of shoulder flexion 0-60*, min elbow flexion, and forearm pronation/supination; question contractures vs Pt resisting movements for further assessment of PROM LUE Deficits / Details: Pt unable to move upon command, bil hands holding washcloth -difficulty passively moving digits; attempted PROM, Pt initially with elbow flexed - wouldn't allow passive movement, however laters relaxes elbow into further extension; unable to perform shoulder PROM, question contractures vs Pt resisting movements for further assessment of PROM   Lower Extremity Assessment Lower Extremity Assessment: Defer to PT evaluation RLE Deficits / Details: Unable to move actively upon command. Decreased DF noted. Pressure wound on heel.  LLE Deficits / Details: Unable to move actively upon command. Decreased DF noted. Pressure wound on heel.        Communication Communication Communication: Expressive difficulties (Pt nonverbal )   Cognition Arousal/Alertness: Lethargic Behavior During Therapy: Flat affect Overall Cognitive Status: No family/caregiver present to determine baseline cognitive functioning                                 General Comments: Non verbal at baseline. Pt requiring multiple cues to open eyes throughout session. Not following verbal commands during session.    General Comments  No family present during session, will need to follow up to determine additional history/PLOF               Home Living Family/patient expects to be discharged to:: Skilled nursing facility                                        Prior  Functioning/Environment Level of Independence: Needs assistance        Comments: Was at SNF, however, no family present to determine baseline, and pt nonverbal. Per notes, reports pt was able to assist with OOB transfers. Followed up with [facility] where Pt was most recently staying, reports Pt would occasionally attempt to feed himself but was otherwise Max-Total assist +2 for ADLs, bed mobility, EOB, and for bed>chair transfers        OT Problem List: Decreased strength;Impaired balance (sitting and/or standing);Decreased cognition;Impaired tone;Decreased range of motion;Impaired UE functional use      OT Treatment/Interventions: Self-care/ADL training;DME and/or AE instruction;Therapeutic activities;Balance training;Therapeutic exercise;Cognitive remediation/compensation;Neuromuscular education;Modalities;Patient/family education    OT Goals(Current goals can be found in the care plan section) Acute Rehab OT Goals Patient Stated Goal: not able to state  OT Goal Formulation: Patient unable to participate in goal setting Time For Goal Achievement: 03/24/17 Potential to Achieve Goals: Fair  OT Frequency: Min 2X/week   Barriers to D/C:  Co-evaluation   Reason for Co-Treatment: Complexity of the patient's impairments (multi-system involvement);Necessary to address cognition/behavior during functional activity;For patient/therapist safety;To address functional/ADL transfers PT goals addressed during session: Mobility/safety with mobility;Balance;Proper use of DME        AM-PAC PT "6 Clicks" Daily Activity     Outcome Measure Help from another person eating meals?: Total Help from another person taking care of personal grooming?: Total Help from another person toileting, which includes using toliet, bedpan, or urinal?: Total Help from another person bathing (including washing, rinsing, drying)?: Total Help from another person to put on and taking off regular upper body  clothing?: Total Help from another person to put on and taking off regular lower body clothing?: Total 6 Click Score: 6   End of Session Nurse Communication: Mobility status  Activity Tolerance: Treatment limited secondary to medical complications (Comment) Patient left: in bed;with call bell/phone within reach;with SCD's reapplied  OT Visit Diagnosis: Muscle weakness (generalized) (M62.81);Other symptoms and signs involving cognitive function;Other abnormalities of gait and mobility (R26.89)                Time: 5396-7289 OT Time Calculation (min): 33 min Charges:  OT General Charges $OT Visit: 1 Procedure OT Evaluation $OT Eval Moderate Complexity: 1 Procedure G-Codes:     Lou Cal, OT Pager [phone] 03/10/2017   Raymondo Band 03/10/2017, 3:03 PM

## 2017-03-10 NOTE — Progress Notes (Signed)
PROGRESS NOTE    William Pena  PIR:[id number] DOB: 1985/10/11 DOA: 02/15/2017 PCP: [name], MD    Brief Narrative:  31 y.o. 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 Initial impression from ID was that he had relapsed cryptococcal meningitis.  Assessment & Plan:   Principal Problem:   Sepsis ([place]) Active Problems:   AIDS (acquired immune deficiency syndrome) (HCC)   Encephalopathy acute   Meningoencephalitis   HIV (human immunodeficiency virus infection) ([place])   AKI (acute kidney injury) ([place])   Protein-calorie malnutrition, severe   Anemia of chronic disease   Acute encephalopathy   Goals of care, counseling/discussion   Palliative care by specialist   Leukopenia   Deep tissue injury bilateral heels   Wound of buttock   Hypokalemia   Encephalitis   Acute renal failure (HCC)   Encephalopathy   Advance care planning  Principal Problem:   Sepsis ([name] to cryptococcal meningeal encephalitis  -he was on oral dilfucan 400mg  daily since admission, this was changed to iv on 8/6 due to poor oral intake -Will need 8 weeks of diflucan 400mg  therapy per ID (started 02/16/17 with stopping date 04/13/17), and will likely need 200 mg of oral fluconazole therapy for another 10 months to 12 months of treatment. Per last ID notes on 8/6, ok to discharge on oral diflucan 400mg  daily - LP done 8/2 with WBC of 37 and crypto in fluid aspirate.  -  No longer septic  Active Problems:   AIDS (acquired immune deficiency syndrome) (HCC)/HIV infection -Had initially been unable to start antiretroviral therapy due to the risk of immune reconstitution inflammatory syndrome per ID.  - Per palliative care, wishes are for continued full treatment and Full Code. If  patient's condition worsens and/or patient requires future hospitalization, then family may consider more palliative approach at that time - Patient is continued on Bactrim daily and azithromycin weekly to prevent opportunistic infections. - ID had discussed with family regarding risk and benefits of ARV. Decision made to start ARV with understanding that patient's condition may worsen or that neuro deficits may never improve despite ARV given concerns of possible anoxic brain injury. - ID had since signed off as of 8/6. Final recommendations from ID for PEG placement , plan to hold (see below) - Patient remains stable at present    Encephalopathy acute Appears to be related to cryptococcal meningeal encephalitis vs anoxic brain injury.  - No significant change in condition has been noted - Patient continues to be non-verbal and with staring spells, he had left sided gaze    AKI (acute kidney injury) ([place]) - Suspect presenting AKI secondary to dehydration - Labs were reviewed. Cr peaked at 2.09,  - Renal function had improved, now wnl    Protein-calorie malnutrition, severe - Patient is continued on supplements.  - Body mass index is 20.3 kg/m.  - Nutrition was consulted. Discussed case with Palliative Care and ID. Recommendations for alternative means of nutrition/tube feeding to maximize nutrition - Initially discussed with GI who recommended IR consultation. IR declined  PEG placement due to risk overweight benefit, cortrack feeding tube recommended,  please see IR note on 8/8 -cortrack feeding tube placed on 8/8, start tube feeds, nutrition consulted, watch for refeeding syndrome, Speech eval result on 8/9 noted -awaiting for placement,  case manager  and Education officer, museum working on placement.      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  - Continue  to transfuse as needed. - Hgb has since remained stable    3 full thickness wounds to bilat buttocks; 2X.5X.5cm, 1X1X1cm, 5.5X3X.2cm - Continue wound care per WOC. Per her notes: "Pt had I&D of buttock abscesses in May with the surgical team, according to the EMR. Thesewounds were present on admission but are NOT pressure injuries." - currently stable    Deep tissue injuries bilateral heels - Wound care per wound care team.  - Patient continued with floating heels as tolerated    Hypokalemia/hypomag - replace k/mag - mag remain low , continue replace with iv mag    Hyponatremia - Mild, monitor. - remains stable    DVT prophylaxis: SCD's Code Status: DNR Family Communication: Pt in room, mother at bedside Disposition Plan: Uncertain at this time, difficult placement  Consultants:   ID (signed off)  Neurology (signed off)  Hematology Dr Marin Olp (signed off)  Palliative Care  IR recommended against peg tube placement   Nutrition  Social worker  Speech/PT/OT  Procedures:   Intermiitent LP's  Antimicrobials: Anti-infectives    Start     Dose/Rate Route Frequency Ordered Stop   03/10/17 1400  fluconazole (DIFLUCAN) 40 MG/ML suspension 400 mg     400 mg Per Tube Daily 03/09/17 1423     03/09/17 2000  sulfamethoxazole-trimethoprim (BACTRIM DS,SEPTRA DS) 800-160 MG per tablet 1 tablet     1 tablet Per Tube Daily 03/08/17 2249     03/06/17 1400  fluconazole (DIFLUCAN) IVPB 400 mg     400 mg 100 mL/hr over 120 Minutes Intravenous Every 24 hours 03/05/17 1519 03/09/17 1608   03/03/17 1430  emtricitabine-tenofovir AF (DESCOVY) 200-25 MG per tablet 1 tablet     1 tablet Oral Daily 03/03/17 1324     03/03/17 1330  dolutegravir (TIVICAY) tablet 50 mg     50 mg Oral Daily 03/03/17 1324     03/03/17 1200  fluconazole (DIFLUCAN) tablet 400 mg  Status:  Discontinued     400 mg Oral Daily 03/03/17 1133 03/05/17 1519   02/22/17 2000  azithromycin (ZITHROMAX) tablet  1,200 mg     1,200 mg Oral Weekly 02/22/17 1812     02/22/17 2000  sulfamethoxazole-trimethoprim (BACTRIM DS,SEPTRA DS) 800-160 MG per tablet 1 tablet  Status:  Discontinued     1 tablet Oral Daily 02/22/17 1812 03/08/17 2249   02/17/17 1400  levofloxacin (LEVAQUIN) IVPB 750 mg  Status:  Discontinued     750 mg 100 mL/hr over 90 Minutes Intravenous Every 48 hours 02/15/17 1515 02/16/17 1043   02/17/17 1000  azithromycin (ZITHROMAX) tablet 1,200 mg  Status:  Discontinued     1,200 mg Oral Every Fri 02/15/17 1615 02/16/17 1308   02/16/17 1330  ceFEPIme (MAXIPIME) 2 g in dextrose 5 % 50 mL IVPB  Status:  Discontinued     2 g 100 mL/hr over 30 Minutes Intravenous Every 24 hours 02/15/17 1515 02/16/17 1043   02/16/17 1200  fluconazole (DIFLUCAN) IVPB 400 mg  Status:  Discontinued     400 mg 100 mL/hr over 120 Minutes Intravenous Every 24 hours 02/16/17 1048 03/03/17 1133   02/16/17 1000  fluconazole (DIFLUCAN) tablet 400 mg  Status:  Discontinued     400 mg Oral Daily 02/15/17 1615 02/16/17 1048  02/16/17 1000  sulfamethoxazole-trimethoprim (BACTRIM DS,SEPTRA DS) 800-160 MG per tablet 1 tablet  Status:  Discontinued     1 tablet Oral Daily 02/15/17 1615 02/16/17 1308   02/16/17 0200  vancomycin (VANCOCIN) 500 mg in sodium chloride 0.9 % 100 mL IVPB  Status:  Discontinued     500 mg 100 mL/hr over 60 Minutes Intravenous Every 12 hours 02/15/17 1515 02/16/17 1043   02/15/17 1315  ceFEPIme (MAXIPIME) 2 g in dextrose 5 % 50 mL IVPB     2 g 100 mL/hr over 30 Minutes Intravenous  Once 02/15/17 1307 02/15/17 1436   02/15/17 1300  levofloxacin (LEVAQUIN) IVPB 750 mg     750 mg 100 mL/hr over 90 Minutes Intravenous  Once 02/15/17 1254 02/15/17 1533   02/15/17 1300  aztreonam (AZACTAM) 2 g in dextrose 5 % 50 mL IVPB  Status:  Discontinued     2 g 100 mL/hr over 30 Minutes Intravenous  Once 02/15/17 1254 02/15/17 1307   02/15/17 1300  vancomycin (VANCOCIN) IVPB 1000 mg/200 mL premix     1,000  mg 200 mL/hr over 60 Minutes Intravenous  Once 02/15/17 1254 02/15/17 1436      Subjective: No significant mental status improvement, Remain Nonverbal , open eyes spontaneously, left side gaze,  cortrack tube in nare. Tolerating tube feeds so far. Mother at bedside.  Objective: Vitals:   03/09/17 1422 03/09/17 1700 03/09/17 2032 03/10/17 0428  BP: 104/73  101/70 100/62  Pulse: 73  73 60  Resp: 18  17 17   Temp: 97.8 F (36.6 C)  (!) 97.2 F (36.2 C) (!) 96.9 F (36.1 C)  TempSrc: Oral  Axillary Axillary  SpO2: 100%  100% 100%  Weight:  39.5 kg (87 lb)    Height:        Intake/Output Summary (Last 24 hours) at 03/10/17 0822 Last data filed at 03/10/17 0517  Gross per 24 hour  Intake          5231.25 ml  Output              850 ml  Net          4381.25 ml   Filed Weights   03/02/17 0439 03/04/17 0458 03/09/17 1700  Weight: 43.1 kg (95 lb) 34 kg (75 lb) 39.5 kg (87 lb)    Examination: General exam: Awake,open eyes spontaneously, left side gaze, does not follow commands, nonverbal, malnourished, chronically ill appearing, does not appear in acute distress, cortrack tube in nare. Respiratory system: Normal respiratory effort, no wheezing Cardiovascular system: regular rate, s1, s2 Gastrointestinal system: Soft, nondistended, positive BS, well healed midline scar from remote surgery Central nervous system: pen eyes spontaneously, left side gaze, not following commands, nonverbal, right upper arm contacted, left arm with some spontaneous movement, lower extremity with heal protactors on, not much movement noticed Extremities: contractures , no edema, bilateral heal protector on Skin: Normal skin turgor, no notable skin lesions seen Psychiatry: difficult to assess given non-verbal state  Data Reviewed: I have personally reviewed following labs and imaging studies  CBC:  Recent Labs Lab 03/04/17 0424 03/05/17 0358 03/08/17 0300  WBC 2.5* 3.1* 7.3  HGB 11.2* 10.6* 9.8*   HCT 33.9* 32.8* 30.4*  MCV 85.6 85.0 84.9  PLT 158 136* 144*   Basic Metabolic Panel:  Recent Labs Lab 03/04/17 0424 03/05/17 0358 03/08/17 0300 03/09/17 0445 03/10/17 0332  NA 130* 133* 134* 133* 135  K 3.9 3.9 4.1 4.9 3.9  CL 105  107 107 107 107  CO2 18* 21* 17* 16* 20*  GLUCOSE 91 87 86 72 93  BUN 13 14 13 12 15   CREATININE 0.54* 0.60* 0.64 0.53* 0.44*  CALCIUM 8.9 9.0 8.9 8.8* 8.6*  MG  --   --   --  1.5* 1.6*  PHOS  --   --   --  4.2 3.8   GFR: Estimated Creatinine Clearance: 74.7 mL/min (A) (by C-G formula based on SCr of 0.44 mg/dL (L)). Liver Function Tests: No results for input(s): AST, ALT, ALKPHOS, BILITOT, PROT, ALBUMIN in the last 168 hours. No results for input(s): LIPASE, AMYLASE in the last 168 hours. No results for input(s): AMMONIA in the last 168 hours. Coagulation Profile: No results for input(s): INR, PROTIME in the last 168 hours. Cardiac Enzymes: No results for input(s): CKTOTAL, CKMB, CKMBINDEX, TROPONINI in the last 168 hours. BNP (last 3 results) No results for input(s): PROBNP in the last 8760 hours. HbA1C: No results for input(s): HGBA1C in the last 72 hours. CBG:  Recent Labs Lab 03/09/17 1737 03/09/17 2032 03/09/17 2353 03/10/17 0427 03/10/17 0753  GLUCAP 107* 97 93 92 87   Lipid Profile: No results for input(s): CHOL, HDL, LDLCALC, TRIG, CHOLHDL, LDLDIRECT in the last 72 hours. Thyroid Function Tests: No results for input(s): TSH, T4TOTAL, FREET4, T3FREE, THYROIDAB in the last 72 hours. Anemia Panel: No results for input(s): VITAMINB12, FOLATE, FERRITIN, TIBC, IRON, RETICCTPCT in the last 72 hours. Sepsis Labs: No results for input(s): PROCALCITON, LATICACIDVEN in the last 168 hours.  Recent Results (from the past 240 hour(s))  CSF culture     Status: None   Collection Time: 03/02/17  8:51 AM  Result Value Ref Range Status   Specimen Description CSF  Final   Special Requests NONE  Final   Gram Stain   Final    CYTOSPIN  SMEAR WBC PRESENT, PREDOMINANTLY MONONUCLEAR ENCAPSULATED YEAST CRITICAL RESULT CALLED TO, READ BACK BY AND VERIFIED WITH: S. Ward RN 10:05 03/02/17 ([email or web address])    Culture NO GROWTH 3 DAYS  Final   Report Status 03/05/2017 FINAL  Final  Fungus Culture With Stain     Status: None (Preliminary result)   Collection Time: 03/02/17  8:56 AM  Result Value Ref Range Status   Fungus Stain Final report  Final    Comment: (NOTE) Performed At: [facility] [place], [place] [id number] Lindon Romp MD YT:[id number]    Fungus (Mycology) Culture PENDING  Incomplete   Fungal Source PENDING  Incomplete  Fungus Culture Result     Status: None   Collection Time: 03/02/17  8:56 AM  Result Value Ref Range Status   Result 1 Comment  Final    Comment: (NOTE) KOH/Calcofluor preparation:  no fungus observed. Performed At: [facility] [address] [place], [place] [id number] Lindon Romp MD BS:[id number]      Radiology Studies: No results found.  Scheduled Meds: . amantadine  100 mg Per Tube BID WC  . azithromycin  1,200 mg Oral Weekly  . dolutegravir  50 mg Oral Daily  . emtricitabine-tenofovir AF  1 tablet Oral Daily  . feeding supplement (ENSURE ENLIVE)  237 mL Oral BID BM  . feeding supplement (PRO-STAT SUGAR FREE 64)  30 mL Per Tube TID  . fluconazole  400 mg Per Tube Q1400  . lidocaine (PF)  5 mL Intradermal Once  . multivitamin  15 mL Per Tube Daily  . nystatin  5 mL Per Tube  QID  . polyvinyl alcohol  1 drop Both Eyes QID  . sodium bicarbonate  650 mg Oral BID  . sulfamethoxazole-trimethoprim  1 tablet Per Tube Daily  . tamsulosin  0.4 mg Oral Daily   Continuous Infusions: . 0.9 % NaCl with KCl 40 mEq / L 50 mL/hr (03/09/17 1623)  . feeding supplement (JEVITY 1.2 CAL) 1,000 mL (03/09/17 1409)  . magnesium sulfate 1 - 4 g bolus IVPB       LOS: 23 days   Prather Failla, MD PhD [facility] Pager [phone]  If 7PM-7AM, please contact  night-coverage [email or web address] Password [email or web address] 03/10/2017, 8:22 AM

## 2017-03-10 NOTE — Progress Notes (Signed)
Nutrition Follow-up  DOCUMENTATION CODES:   Underweight, Severe malnutrition in context of chronic illness  INTERVENTION:   -Continue Jevity 1.5@ 70m/hr via cortrak tube  382mProstat TID.   Tube feeding regimen provides 1560kcal (100% of needs), 99grams of protein, and 63390mf H2O.   -Recommend monitoring K, Mg, and Phos daily x 3 days, replete as necessary, and monitor for signs of refeeding syndrome  -Liquid MVI daily  -Ensure Enlive po BID, each supplement provides 350 kcal and 20 grams of protein  NUTRITION DIAGNOSIS:   Malnutrition (severe) related to chronic illness (AIDS) as evidenced by severe depletion of body fat, severe depletion of muscle mass, percent weight loss.  Ongoing  GOAL:   Patient will meet greater than or equal to 90% of their needs  Met with TF  MONITOR:   PO intake, Supplement acceptance, Labs, Weight trends, Skin, I & O's  REASON FOR ASSESSMENT:   Consult Enteral/tube feeding initiation and management  ASSESSMENT:   [age] Male with PMH of AIDS, recent admission for perirectal abscess, development of cryptococcal meningoencephalitis, recent CVA,  ARF, severe protein calorie malnutrition who presents to the ED from facility with increased mental status change. Patient was in usual state of health. On recent discharge (dated 7/9), patient was recommended by ID to continue fluconazole x 4 more weeks before resuming antiviral meds for HIV. Patient is non-verbal at baseline. Patient's mother reported patient's poor PO intake recently.  8/8- s/p cortrak tube placement; tip of tube in stomach 8/9- TF initiated  Case discussed with RN. TF has been advanced to goal rate; tolerating well.   Pt very somnolent at time of visit. Observed breakfast tray; pt consumed only a few sips of Ensure.   Labs reviewed: Mg: 1.6 (on IV supplementation), K and Phos WDL, CBGS WDL.   Diet Order:  Diet regular Room service appropriate? Yes; Fluid  consistency: Thin  Skin:  Wound (see comment) (3 FT wounds to bilateral buttocks, DPTI rt/lt heels)  Last BM:  03/08/17  Height:   Ht Readings from Last 1 Encounters:  02/17/17 '5\' 6"'  (1.676 m)    Weight:   Wt Readings from Last 1 Encounters:  03/09/17 87 lb (39.5 kg)    Ideal Body Weight:  75.4 kg  BMI:  Body mass index is 14.04 kg/m.  Estimated Nutritional Needs:   Kcal:  1400-1600  Protein:  85-100 grams  Fluid:  > 1.4 L  EDUCATION NEEDS:   No education needs identified at this time  Jyssica Rief A. WilJimmye NormanD, LDN, CDE Pager: [phone] hours Pager: [phone]

## 2017-03-10 NOTE — Progress Notes (Signed)
CSW spoke with patient's mother and provided emotional support.   Percell Locus Taliesin Hartlage LCSWA [phone]

## 2017-03-11 DIAGNOSIS — Z960 Presence of urogenital implants: Secondary | ICD-10-CM

## 2017-03-11 LAB — BASIC METABOLIC PANEL
ANION GAP: 6 (ref 5–15)
Anion gap: 8 (ref 5–15)
BUN: 14 mg/dL (ref 6–20)
BUN: 11 mg/dL (ref 6–20)
CHLORIDE: 108 mmol/L (ref 101–111)
CO2: 20 mmol/L — ABNORMAL LOW (ref 22–32)
CO2: 21 mmol/L — ABNORMAL LOW (ref 22–32)
CREATININE: 0.5 mg/dL — AB (ref 0.61–1.24)
Calcium: 8.6 mg/dL — ABNORMAL LOW (ref 8.9–10.3)
Calcium: 8.6 mg/dL — ABNORMAL LOW (ref 8.9–10.3)
Chloride: 105 mmol/L (ref 101–111)
Creatinine, Ser: 0.49 mg/dL — ABNORMAL LOW (ref 0.61–1.24)
GFR calc Af Amer: >60 mL/min (ref >60)
GFR calc Af Amer: >60 mL/min (ref >60)
GLUCOSE: 104 mg/dL — AB (ref 65–99)
Glucose, Bld: 97 mg/dL (ref 65–99)
POTASSIUM: 4.4 mmol/L (ref 3.5–5.1)
POTASSIUM: 4.4 mmol/L (ref 3.5–5.1)
SODIUM: 136 mmol/L (ref 135–145)
SODIUM: 132 mmol/L — AB (ref 135–145)

## 2017-03-11 LAB — GLUCOSE, CAPILLARY
GLUCOSE-CAPILLARY: 109 mg/dL — AB (ref 65–99)
GLUCOSE-CAPILLARY: 88 mg/dL (ref 65–99)
GLUCOSE-CAPILLARY: 90 mg/dL (ref 65–99)
Glucose-Capillary: 81 mg/dL (ref 65–99)
Glucose-Capillary: 85 mg/dL (ref 65–99)
Glucose-Capillary: 91 mg/dL (ref 65–99)

## 2017-03-11 LAB — CULTURE, FUNGUS WITHOUT SMEAR

## 2017-03-11 LAB — CBC
HCT: 30.9 % — ABNORMAL LOW (ref 39.0–52.0)
Hemoglobin: 10.2 g/dL — ABNORMAL LOW (ref 13.0–17.0)
MCH: 28.1 pg (ref 26.0–34.0)
MCHC: 33 g/dL (ref 30.0–36.0)
MCV: 85.1 fL (ref 78.0–100.0)
Platelets: 163 10*3/uL (ref 150–400)
RBC: 3.63 MIL/uL — ABNORMAL LOW (ref 4.22–5.81)
RDW: 15.5 % (ref 11.5–15.5)
WBC: 4.4 10*3/uL (ref 4.0–10.5)

## 2017-03-11 LAB — MAGNESIUM: MAGNESIUM: 1.7 mg/dL (ref 1.7–2.4)

## 2017-03-11 NOTE — Progress Notes (Signed)
PROGRESS NOTE    William Pena  SEG:[id number] DOB: 1986-06-09 DOA: 02/15/2017 PCP: [name], MD    Brief Narrative:  32 y.o. 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 Initial impression from ID was that he had relapsed cryptococcal meningitis.  Assessment & Plan:   Principal Problem:   Sepsis ([place]) Active Problems:   AIDS (acquired immune deficiency syndrome) (HCC)   Encephalopathy acute   Meningoencephalitis   HIV (human immunodeficiency virus infection) ([place])   AKI (acute kidney injury) ([place])   Protein-calorie malnutrition, severe   Anemia of chronic disease   Acute encephalopathy   Goals of care, counseling/discussion   Palliative care by specialist   Leukopenia   Deep tissue injury bilateral heels   Wound of buttock   Hypokalemia   Encephalitis   Acute renal failure (HCC)   Encephalopathy   Advance care planning  Principal Problem:   Sepsis ([name] to cryptococcal meningeal encephalitis  -he was on oral dilfucan 400mg  daily since admission, this was changed to iv on 8/6 due to poor oral intake -Will need 8 weeks of diflucan 400mg  therapy per ID (started 02/16/17 with stopping date 04/13/17), and will likely need 200 mg of oral fluconazole therapy for another 10 months to 12 months of treatment. Per last ID notes on 8/6, ok to discharge on oral diflucan 400mg  daily - LP done 8/2 with WBC of 37 and crypto in fluid aspirate.  -  No longer septic  Active Problems:   AIDS (acquired immune deficiency syndrome) (HCC)/HIV infection -Had initially been unable to start antiretroviral therapy due to the risk of immune reconstitution inflammatory syndrome per ID.  - Per palliative care, wishes are for continued full treatment and Full Code. If  patient's condition worsens and/or patient requires future hospitalization, then family may consider more palliative approach at that time - Patient is continued on Bactrim daily and azithromycin weekly to prevent opportunistic infections. - ID had discussed with family regarding risk and benefits of ARV. Decision made to start ARV with understanding that patient's condition may worsen or that neuro deficits may never improve despite ARV given concerns of possible anoxic brain injury. - ID had since signed off as of 8/6. Final recommendations from ID for PEG placement , plan to hold (see below) - Patient remains stable at present    Encephalopathy acute Appears to be related to cryptococcal meningeal encephalitis vs anoxic brain injury.  - No significant change in condition has been noted - Patient continues to be non-verbal and with staring spells, he had left sided gaze    AKI (acute kidney injury) ([place]) - Suspect presenting AKI secondary to dehydration - Labs were reviewed. Cr peaked at 2.09,  - Renal function had improved, now wnl  Hypokalemia/hypomag/  Hyponatremia - improved, will stop checking daily labs, will check bmp/mag every Wednesday and Saturday unless clinical changes    Protein-calorie malnutrition, severe - Patient is continued on supplements.  - Body mass index is 20.3 kg/m.  - Nutrition was consulted. Discussed case with Palliative Care and ID. Recommendations for alternative means of nutrition/tube feeding to maximize nutrition - Initially discussed with GI who recommended IR consultation. IR declined  PEG placement due to risk overweight benefit, cortrack feeding tube recommended,  please see IR note on 8/8 -cortrack feeding tube placed on 8/8,  start tube feeds, nutrition consulted, watch for refeeding syndrome, Speech eval result on 8/9 noted -awaiting for placement,  case manager and social worker working on placement.      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  - Continue to transfuse as needed. - Hgb has since remained stable, will stop check cbc daily, change to checking cbc every Wednesday and sat    3 full thickness wounds to bilat buttocks; 2X.5X.5cm, 1X1X1cm, 5.5X3X.2cm - Continue wound care per WOC. Per her notes: "Pt had I&D of buttock abscesses in May with the surgical team, according to the EMR. Thesewounds were present on admission but are NOT pressure injuries." - currently stable    Deep tissue injuries bilateral heels - Wound care per wound care team.  - Patient continued with floating heels as tolerated       DVT prophylaxis: SCD's Code Status: DNR Family Communication: Pt in room Disposition Plan: Uncertain at this time, difficult placement  Consultants:   ID (signed off)  Neurology (signed off)  Hematology Dr Marin Olp (signed off)  Palliative Care  IR recommended against peg tube placement   Nutrition  Social worker  Speech/PT/OT  Procedures:   Intermiitent LP's  Antimicrobials: Anti-infectives    Start     Dose/Rate Route Frequency Ordered Stop   03/10/17 1400  fluconazole (DIFLUCAN) 40 MG/ML suspension 400 mg     400 mg Per Tube Daily 03/09/17 1423     03/09/17 2000  sulfamethoxazole-trimethoprim (BACTRIM DS,SEPTRA DS) 800-160 MG per tablet 1 tablet     1 tablet Per Tube Daily 03/08/17 2249     03/06/17 1400  fluconazole (DIFLUCAN) IVPB 400 mg     400 mg 100 mL/hr over 120 Minutes Intravenous Every 24 hours 03/05/17 1519 03/09/17 1608   03/03/17 1430  emtricitabine-tenofovir AF (DESCOVY) 200-25 MG per tablet 1 tablet     1 tablet Oral Daily 03/03/17 1324     03/03/17 1330  dolutegravir (TIVICAY) tablet 50 mg     50 mg Oral Daily 03/03/17 1324     03/03/17 1200  fluconazole (DIFLUCAN) tablet 400 mg  Status:  Discontinued     400 mg Oral  Daily 03/03/17 1133 03/05/17 1519   02/22/17 2000  azithromycin (ZITHROMAX) tablet 1,200 mg     1,200 mg Oral Weekly 02/22/17 1812     02/22/17 2000  sulfamethoxazole-trimethoprim (BACTRIM DS,SEPTRA DS) 800-160 MG per tablet 1 tablet  Status:  Discontinued     1 tablet Oral Daily 02/22/17 1812 03/08/17 2249   02/17/17 1400  levofloxacin (LEVAQUIN) IVPB 750 mg  Status:  Discontinued     750 mg 100 mL/hr over 90 Minutes Intravenous Every 48 hours 02/15/17 1515 02/16/17 1043   02/17/17 1000  azithromycin (ZITHROMAX) tablet 1,200 mg  Status:  Discontinued     1,200 mg Oral Every Fri 02/15/17 1615 02/16/17 1308   02/16/17 1330  ceFEPIme (MAXIPIME) 2 g in dextrose 5 % 50 mL IVPB  Status:  Discontinued     2 g 100 mL/hr over 30 Minutes Intravenous Every 24 hours 02/15/17 1515 02/16/17 1043   02/16/17 1200  fluconazole (DIFLUCAN) IVPB 400 mg  Status:  Discontinued     400 mg 100 mL/hr over 120 Minutes Intravenous Every 24 hours 02/16/17 1048 03/03/17 1133   02/16/17 1000  fluconazole (DIFLUCAN) tablet 400 mg  Status:  Discontinued     400 mg Oral  Daily 02/15/17 1615 02/16/17 1048   02/16/17 1000  sulfamethoxazole-trimethoprim (BACTRIM DS,SEPTRA DS) 800-160 MG per tablet 1 tablet  Status:  Discontinued     1 tablet Oral Daily 02/15/17 1615 02/16/17 1308   02/16/17 0200  vancomycin (VANCOCIN) 500 mg in sodium chloride 0.9 % 100 mL IVPB  Status:  Discontinued     500 mg 100 mL/hr over 60 Minutes Intravenous Every 12 hours 02/15/17 1515 02/16/17 1043   02/15/17 1315  ceFEPIme (MAXIPIME) 2 g in dextrose 5 % 50 mL IVPB     2 g 100 mL/hr over 30 Minutes Intravenous  Once 02/15/17 1307 02/15/17 1436   02/15/17 1300  levofloxacin (LEVAQUIN) IVPB 750 mg     750 mg 100 mL/hr over 90 Minutes Intravenous  Once 02/15/17 1254 02/15/17 1533   02/15/17 1300  aztreonam (AZACTAM) 2 g in dextrose 5 % 50 mL IVPB  Status:  Discontinued     2 g 100 mL/hr over 30 Minutes Intravenous  Once 02/15/17 1254 02/15/17 1307     02/15/17 1300  vancomycin (VANCOCIN) IVPB 1000 mg/200 mL premix     1,000 mg 200 mL/hr over 60 Minutes Intravenous  Once 02/15/17 1254 02/15/17 1436      Subjective: No significant mental status improvement, Remain Nonverbal , open eyes spontaneously, left side gaze,  cortrack tube in nare. Tolerating tube feeds so far. Indwelling foley with clear urine   Objective: Vitals:   03/10/17 0428 03/10/17 1718 03/10/17 2232 03/11/17 0436  BP: 100/62 108/66 108/67 100/68  Pulse: 60 85 91 82  Resp: 17 16 18 16   Temp: (!) 96.9 F (36.1 C) 98.3 F (36.8 C) 97.8 F (36.6 C) 98.2 F (36.8 C)  TempSrc: Axillary Oral Axillary Axillary  SpO2: 100% 100% 100% 100%  Weight:   39.8 kg (87 lb 11.9 oz) 40.1 kg (88 lb 6.5 oz)  Height:        Intake/Output Summary (Last 24 hours) at 03/11/17 0819 Last data filed at 03/11/17 0617  Gross per 24 hour  Intake          1430.92 ml  Output             1400 ml  Net            30.92 ml   Filed Weights   03/09/17 1700 03/10/17 2232 03/11/17 0436  Weight: 39.5 kg (87 lb) 39.8 kg (87 lb 11.9 oz) 40.1 kg (88 lb 6.5 oz)    Examination: General exam: Awake,open eyes spontaneously, left side gaze, does not follow commands, nonverbal, malnourished, chronically ill appearing, does not appear in acute distress, cortrack tube in nare. Respiratory system: Normal respiratory effort, no wheezing Cardiovascular system: regular rate, s1, s2 Gastrointestinal system: Soft, nondistended, positive BS, well healed midline scar from remote surgery Central nervous system: pen eyes spontaneously, left side gaze, not following commands, nonverbal, right upper arm contacted, left arm with some spontaneous movement, lower extremity with heal protactors on, not much movement noticed Extremities:  no edema, bilateral heal protector on, left lower extremity with spontaneous movement when touched, now movement on left side, left upper arm contacted Skin: Normal skin turgor, no  notable skin lesions seen Psychiatry: difficult to assess given non-verbal state  Data Reviewed: I have personally reviewed following labs and imaging studies  CBC:  Recent Labs Lab 03/05/17 0358 03/08/17 0300  WBC 3.1* 7.3  HGB 10.6* 9.8*  HCT 32.8* 30.4*  MCV 85.0 84.9  PLT 136* 125*  Basic Metabolic Panel:  Recent Labs Lab 03/05/17 0358 03/08/17 0300 03/09/17 0445 03/10/17 0332 03/11/17 0402  NA 133* 134* 133* 135 136  K 3.9 4.1 4.9 3.9 4.4  CL 107 107 107 107 108  CO2 21* 17* 16* 20* 20*  GLUCOSE 87 86 72 93 104*  BUN 14 13 12 15 14   CREATININE 0.60* 0.64 0.53* 0.44* 0.50*  CALCIUM 9.0 8.9 8.8* 8.6* 8.6*  MG  --   --  1.5* 1.6* 1.7  PHOS  --   --  4.2 3.8  --    GFR: Estimated Creatinine Clearance: 75.9 mL/min (A) (by C-G formula based on SCr of 0.5 mg/dL (L)). Liver Function Tests: No results for input(s): AST, ALT, ALKPHOS, BILITOT, PROT, ALBUMIN in the last 168 hours. No results for input(s): LIPASE, AMYLASE in the last 168 hours. No results for input(s): AMMONIA in the last 168 hours. Coagulation Profile: No results for input(s): INR, PROTIME in the last 168 hours. Cardiac Enzymes: No results for input(s): CKTOTAL, CKMB, CKMBINDEX, TROPONINI in the last 168 hours. BNP (last 3 results) No results for input(s): PROBNP in the last 8760 hours. HbA1C: No results for input(s): HGBA1C in the last 72 hours. CBG:  Recent Labs Lab 03/10/17 1205 03/10/17 1730 03/10/17 2019 03/11/17 0006 03/11/17 0434  GLUCAP 88 87 90 109* 81   Lipid Profile: No results for input(s): CHOL, HDL, LDLCALC, TRIG, CHOLHDL, LDLDIRECT in the last 72 hours. Thyroid Function Tests: No results for input(s): TSH, T4TOTAL, FREET4, T3FREE, THYROIDAB in the last 72 hours. Anemia Panel: No results for input(s): VITAMINB12, FOLATE, FERRITIN, TIBC, IRON, RETICCTPCT in the last 72 hours. Sepsis Labs: No results for input(s): PROCALCITON, LATICACIDVEN in the last 168 hours.  Recent  Results (from the past 240 hour(s))  CSF culture     Status: None   Collection Time: 03/02/17  8:51 AM  Result Value Ref Range Status   Specimen Description CSF  Final   Special Requests NONE  Final   Gram Stain   Final    CYTOSPIN SMEAR WBC PRESENT, PREDOMINANTLY MONONUCLEAR ENCAPSULATED YEAST CRITICAL RESULT CALLED TO, READ BACK BY AND VERIFIED WITH: S. Ward RN 10:05 03/02/17 ([email or web address])    Culture NO GROWTH 3 DAYS  Final   Report Status 03/05/2017 FINAL  Final  Fungus Culture With Stain     Status: None (Preliminary result)   Collection Time: 03/02/17  8:56 AM  Result Value Ref Range Status   Fungus Stain Final report  Final    Comment: (NOTE) Performed At: [facility] [place], [place] [id number] Lindon Romp MD GG:[id number]    Fungus (Mycology) Culture PENDING  Incomplete   Fungal Source PENDING  Incomplete  Fungus Culture Result     Status: None   Collection Time: 03/02/17  8:56 AM  Result Value Ref Range Status   Result 1 Comment  Final    Comment: (NOTE) KOH/Calcofluor preparation:  no fungus observed. Performed At: [facility] [address] [place], [place] [id number] Lindon Romp MD [id number]      Radiology Studies: No results found.  Scheduled Meds: . amantadine  100 mg Per Tube BID WC  . azithromycin  1,200 mg Oral Weekly  . dolutegravir  50 mg Oral Daily  . emtricitabine-tenofovir AF  1 tablet Oral Daily  . feeding supplement (ENSURE ENLIVE)  237 mL Oral BID BM  . feeding supplement (PRO-STAT SUGAR FREE 64)  30 mL Per Tube TID  . fluconazole  400 mg Per Tube Q1400  . lidocaine (PF)  5 mL Intradermal Once  . multivitamin  15 mL Per Tube Daily  . nystatin  5 mL Per Tube QID  . polyvinyl alcohol  1 drop Both Eyes QID  . sulfamethoxazole-trimethoprim  1 tablet Per Tube Daily  . tamsulosin  0.4 mg Oral Daily   Continuous Infusions: . 0.9 % NaCl with KCl 40 mEq / L 50 mL/hr (03/10/17 1323)  . feeding supplement  (JEVITY 1.2 CAL) 1,000 mL (03/11/17 0542)     LOS: 24 days   Koral Thaden, MD PhD [facility] Pager [phone]  If 7PM-7AM, please contact night-coverage [email or web address] Password [facility] 03/11/2017, 8:19 AM

## 2017-03-11 NOTE — Progress Notes (Signed)
Pt's foley was changed on 03/01/17.

## 2017-03-12 DIAGNOSIS — Z789 Other specified health status: Secondary | ICD-10-CM

## 2017-03-12 LAB — GLUCOSE, CAPILLARY
GLUCOSE-CAPILLARY: 87 mg/dL (ref 65–99)
GLUCOSE-CAPILLARY: 87 mg/dL (ref 65–99)
Glucose-Capillary: 79 mg/dL (ref 65–99)
Glucose-Capillary: 74 mg/dL (ref 65–99)
Glucose-Capillary: 94 mg/dL (ref 65–99)
Glucose-Capillary: 81 mg/dL (ref 65–99)
Glucose-Capillary: 134 mg/dL — ABNORMAL HIGH (ref 65–99)

## 2017-03-12 NOTE — Progress Notes (Signed)
PROGRESS NOTE    William Pena  LKG:[id number] DOB: 20-Jul-1986 DOA: 02/15/2017 PCP: [name], MD    Brief Narrative:  31 y.o. 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 Initial impression from ID was that he had relapsed cryptococcal meningitis.  Assessment & Plan:   Principal Problem:   Sepsis ([place]) Active Problems:   AIDS (acquired immune deficiency syndrome) (HCC)   Encephalopathy acute   Meningoencephalitis   HIV (human immunodeficiency virus infection) ([place])   AKI (acute kidney injury) ([place])   Protein-calorie malnutrition, severe   Anemia of chronic disease   Acute encephalopathy   Goals of care, counseling/discussion   Palliative care by specialist   Leukopenia   Deep tissue injury bilateral heels   Wound of buttock   Hypokalemia   Encephalitis   Acute renal failure (HCC)   Encephalopathy   Advance care planning  Principal Problem:   Sepsis ([name] to cryptococcal meningeal encephalitis  -he was on oral dilfucan 400mg  daily since admission, this was changed to iv on 8/6 due to poor oral intake -Will need 8 weeks of diflucan 400mg  therapy per ID (started 02/16/17 with stopping date 04/13/17), and will likely need 200 mg of oral fluconazole therapy for another 10 months to 12 months of treatment. Per last ID notes on 8/6, ok to discharge on oral diflucan 400mg  daily - LP done 8/2 with WBC of 37 and crypto in fluid aspirate.  -  No longer septic, but mental status not improving as of 03/12/2017   Active Problems:   AIDS (acquired immune deficiency syndrome) (HCC)/HIV infection -Had initially been unable to start antiretroviral therapy due to the risk of immune reconstitution inflammatory syndrome per ID.  - Per palliative care, wishes are  for continued full treatment and Full Code. If patient's condition worsens and/or patient requires future hospitalization, then family may consider more palliative approach at that time - Patient is continued on Bactrim daily and azithromycin weekly to prevent opportunistic infections. - ID had discussed with family regarding risk and benefits of ARV. Decision made to start ARV with understanding that patient's condition may worsen or that neuro deficits may never improve despite ARV given concerns of possible anoxic brain injury. - ID had since signed off as of 8/6. Final recommendations from ID for PEG placement , IR consulted and declined PEG placement due to risk over weight benefit in current condition, please see IR note on 8/8.  F - Patient vital remains stable at present    Encephalopathy acute Appears to be related to cryptococcal meningeal encephalitis vs anoxic brain injury.  - No significant change in condition has been noted as of 03/12/2017 - Patient continues to be non-verbal and with staring spells, he had left sided gaze, seems to have right sided neglect     AKI (acute kidney injury) ([place]) - presenting AKI likely secondary to dehydration - Labs were reviewed. Cr peaked at 2.09,  - Renal function had improved, now wnl  Hypokalemia/hypomag/  Hyponatremia - improved,  -will check am lab tomorrow on Monday,  Then changed to bmp/mag every Wednesday and Saturday unless clinical changes    Protein-calorie malnutrition, severe - Patient is continued on supplements.  - Body mass index is 20.3 kg/m.  - Nutrition was consulted. Discussed case with Palliative Care and ID. Recommendations for alternative  means of nutrition/tube feeding to maximize nutrition - Initially discussed with GI who recommended IR consultation. IR declined  PEG placement due to risk overweight benefit, cortrack feeding tube recommended,  please see IR note on 8/8 -cortrack feeding tube placed on 8/8, start  tube feeds, nutrition consulted, watch for refeeding syndrome, Speech eval result on 8/9 noted -awaiting for placement,  case manager and social worker working on placement.      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  - Continue to transfuse as needed. - Hgb has since remained stable, will stop check cbc daily, change to checking cbc every Wednesday and sat    3 full thickness wounds to bilat buttocks; 2X.5X.5cm, 1X1X1cm, 5.5X3X.2cm - Continue wound care per WOC. Per her notes: "Pt had I&D of buttock abscesses in May with the surgical team, according to the EMR. Thesewounds were present on admission but are NOT pressure injuries." - currently stable    Deep tissue injuries bilateral heels - Wound care per wound care team.  - Patient continued with floating heels as tolerated       DVT prophylaxis: SCD's Code Status: DNR Family Communication: Pt in room Disposition Plan: Uncertain at this time, difficult placement  Consultants:   ID (signed off)  Neurology (signed off)  Hematology Dr Marin Olp (signed off)  Palliative Care  IR recommended against peg tube placement   Nutrition  Social worker  Speech/PT/OT  Procedures:   Intermiitent LP's  Antimicrobials: Anti-infectives    Start     Dose/Rate Route Frequency Ordered Stop   03/10/17 1400  fluconazole (DIFLUCAN) 40 MG/ML suspension 400 mg     400 mg Per Tube Daily 03/09/17 1423     03/09/17 2000  sulfamethoxazole-trimethoprim (BACTRIM DS,SEPTRA DS) 800-160 MG per tablet 1 tablet     1 tablet Per Tube Daily 03/08/17 2249     03/06/17 1400  fluconazole (DIFLUCAN) IVPB 400 mg     400 mg 100 mL/hr over 120 Minutes Intravenous Every 24 hours 03/05/17 1519 03/09/17 1608   03/03/17 1430  emtricitabine-tenofovir AF (DESCOVY) 200-25 MG per tablet 1 tablet     1 tablet Oral Daily  03/03/17 1324     03/03/17 1330  dolutegravir (TIVICAY) tablet 50 mg     50 mg Oral Daily 03/03/17 1324     03/03/17 1200  fluconazole (DIFLUCAN) tablet 400 mg  Status:  Discontinued     400 mg Oral Daily 03/03/17 1133 03/05/17 1519   02/22/17 2000  azithromycin (ZITHROMAX) tablet 1,200 mg     1,200 mg Oral Weekly 02/22/17 1812     02/22/17 2000  sulfamethoxazole-trimethoprim (BACTRIM DS,SEPTRA DS) 800-160 MG per tablet 1 tablet  Status:  Discontinued     1 tablet Oral Daily 02/22/17 1812 03/08/17 2249   02/17/17 1400  levofloxacin (LEVAQUIN) IVPB 750 mg  Status:  Discontinued     750 mg 100 mL/hr over 90 Minutes Intravenous Every 48 hours 02/15/17 1515 02/16/17 1043   02/17/17 1000  azithromycin (ZITHROMAX) tablet 1,200 mg  Status:  Discontinued     1,200 mg Oral Every Fri 02/15/17 1615 02/16/17 1308   02/16/17 1330  ceFEPIme (MAXIPIME) 2 g in dextrose 5 % 50 mL IVPB  Status:  Discontinued     2 g 100 mL/hr over 30 Minutes Intravenous Every 24 hours 02/15/17 1515 02/16/17 1043   02/16/17 1200  fluconazole (DIFLUCAN) IVPB 400 mg  Status:  Discontinued     400 mg 100 mL/hr over 120 Minutes Intravenous Every 24 hours 02/16/17 1048 03/03/17 1133   02/16/17 1000  fluconazole (DIFLUCAN) tablet 400 mg  Status:  Discontinued     400 mg Oral Daily 02/15/17 1615 02/16/17 1048   02/16/17 1000  sulfamethoxazole-trimethoprim (BACTRIM DS,SEPTRA DS) 800-160 MG per tablet 1 tablet  Status:  Discontinued     1 tablet Oral Daily 02/15/17 1615 02/16/17 1308   02/16/17 0200  vancomycin (VANCOCIN) 500 mg in sodium chloride 0.9 % 100 mL IVPB  Status:  Discontinued     500 mg 100 mL/hr over 60 Minutes Intravenous Every 12 hours 02/15/17 1515 02/16/17 1043   02/15/17 1315  ceFEPIme (MAXIPIME) 2 g in dextrose 5 % 50 mL IVPB     2 g 100 mL/hr over 30 Minutes Intravenous  Once 02/15/17 1307 02/15/17 1436   02/15/17 1300  levofloxacin (LEVAQUIN) IVPB 750 mg     750 mg 100 mL/hr over 90 Minutes Intravenous  Once  02/15/17 1254 02/15/17 1533   02/15/17 1300  aztreonam (AZACTAM) 2 g in dextrose 5 % 50 mL IVPB  Status:  Discontinued     2 g 100 mL/hr over 30 Minutes Intravenous  Once 02/15/17 1254 02/15/17 1307   02/15/17 1300  vancomycin (VANCOCIN) IVPB 1000 mg/200 mL premix     1,000 mg 200 mL/hr over 60 Minutes Intravenous  Once 02/15/17 1254 02/15/17 1436      Subjective: No significant mental status improvement, Remain Nonverbal , open eyes spontaneously, left side gaze,  cortrack tube in nare. Tolerating tube feeds so far. Indwelling foley  (last changed on 03/01/2017)with clear urine   Objective: Vitals:   03/11/17 0436 03/11/17 1320 03/11/17 2126 03/12/17 0500  BP: 100/68 106/67 138/66 114/76  Pulse: 82 67 (!) 59 61  Resp: 16 16 16 16   Temp: 98.2 F (36.8 C) 97.6 F (36.4 C) (!) 97.4 F (36.3 C) (!) 97.5 F (36.4 C)  TempSrc: Axillary Oral Oral Oral  SpO2: 100% 100% 100%   Weight: 40.1 kg (88 lb 6.5 oz)   41.3 kg (91 lb)  Height:        Intake/Output Summary (Last 24 hours) at 03/12/17 0749 Last data filed at 03/11/17 1319  Gross per 24 hour  Intake                0 ml  Output              400 ml  Net             -400 ml   Filed Weights   03/10/17 2232 03/11/17 0436 03/12/17 0500  Weight: 39.8 kg (87 lb 11.9 oz) 40.1 kg (88 lb 6.5 oz) 41.3 kg (91 lb)    Examination: General exam: Awake,open eyes spontaneously, left side gaze, does not follow commands, nonverbal, malnourished, chronically ill appearing, does not appear in acute distress, cortrack tube in nare. Respiratory system: Normal respiratory effort, no wheezing Cardiovascular system: regular rate, s1, s2 Gastrointestinal system: Soft, nondistended, positive BS, well healed midline scar from remote surgery Central nervous system: open eyes spontaneously, left side gaze, does not commands, nonverbal, right upper arm contacted, left arm with some spontaneous movement, lower extremity with heal protactors on, not much  movement noticed, seems have right sided neglect. Extremities:  no edema, bilateral heal protector on, left lower extremity with spontaneous movement when touched, now movement on left side, left upper arm contacted Skin:  Normal skin turgor, no notable skin lesions seen Psychiatry: difficult to assess given non-verbal state  Data Reviewed: I have personally reviewed following labs and imaging studies  CBC:  Recent Labs Lab 03/08/17 0300 03/11/17 1825  WBC 7.3 4.4  HGB 9.8* 10.2*  HCT 30.4* 30.9*  MCV 84.9 85.1  PLT 125* 762   Basic Metabolic Panel:  Recent Labs Lab 03/08/17 0300 03/09/17 0445 03/10/17 0332 03/11/17 0402 03/11/17 1825  NA 134* 133* 135 136 132*  K 4.1 4.9 3.9 4.4 4.4  CL 107 107 107 108 105  CO2 17* 16* 20* 20* 21*  GLUCOSE 86 72 93 104* 97  BUN 13 12 15 14 11   CREATININE 0.64 0.53* 0.44* 0.50* 0.49*  CALCIUM 8.9 8.8* 8.6* 8.6* 8.6*  MG  --  1.5* 1.6* 1.7  --   PHOS  --  4.2 3.8  --   --    GFR: Estimated Creatinine Clearance: 78.2 mL/min (A) (by C-G formula based on SCr of 0.49 mg/dL (L)). Liver Function Tests: No results for input(s): AST, ALT, ALKPHOS, BILITOT, PROT, ALBUMIN in the last 168 hours. No results for input(s): LIPASE, AMYLASE in the last 168 hours. No results for input(s): AMMONIA in the last 168 hours. Coagulation Profile: No results for input(s): INR, PROTIME in the last 168 hours. Cardiac Enzymes: No results for input(s): CKTOTAL, CKMB, CKMBINDEX, TROPONINI in the last 168 hours. BNP (last 3 results) No results for input(s): PROBNP in the last 8760 hours. HbA1C: No results for input(s): HGBA1C in the last 72 hours. CBG:  Recent Labs Lab 03/11/17 1207 03/11/17 1714 03/11/17 2121 03/12/17 0048 03/12/17 0534  GLUCAP 91 88 90 87 79   Lipid Profile: No results for input(s): CHOL, HDL, LDLCALC, TRIG, CHOLHDL, LDLDIRECT in the last 72 hours. Thyroid Function Tests: No results for input(s): TSH, T4TOTAL, FREET4, T3FREE,  THYROIDAB in the last 72 hours. Anemia Panel: No results for input(s): VITAMINB12, FOLATE, FERRITIN, TIBC, IRON, RETICCTPCT in the last 72 hours. Sepsis Labs: No results for input(s): PROCALCITON, LATICACIDVEN in the last 168 hours.  Recent Results (from the past 240 hour(s))  CSF culture     Status: None   Collection Time: 03/02/17  8:51 AM  Result Value Ref Range Status   Specimen Description CSF  Final   Special Requests NONE  Final   Gram Stain   Final    CYTOSPIN SMEAR WBC PRESENT, PREDOMINANTLY MONONUCLEAR ENCAPSULATED YEAST CRITICAL RESULT CALLED TO, READ BACK BY AND VERIFIED WITH: S. Ward RN 10:05 03/02/17 ([email or web address])    Culture NO GROWTH 3 DAYS  Final   Report Status 03/05/2017 FINAL  Final  Fungus Culture With Stain     Status: None (Preliminary result)   Collection Time: 03/02/17  8:56 AM  Result Value Ref Range Status   Fungus Stain Final report  Final    Comment: (NOTE) Performed At: [facility] [place], [place] [id number] Lindon Romp MD YB:[id number]    Fungus (Mycology) Culture PENDING  Incomplete   Fungal Source PENDING  Incomplete  Fungus Culture Result     Status: None   Collection Time: 03/02/17  8:56 AM  Result Value Ref Range Status   Result 1 Comment  Final    Comment: (NOTE) KOH/Calcofluor preparation:  no fungus observed. Performed At: [facility] [address] [place], [place] [id number] Lindon Romp MD OM:[id number]      Radiology Studies: No results found.  Scheduled Meds: . amantadine  100  mg Per Tube BID WC  . azithromycin  1,200 mg Oral Weekly  . dolutegravir  50 mg Oral Daily  . emtricitabine-tenofovir AF  1 tablet Oral Daily  . feeding supplement (ENSURE ENLIVE)  237 mL Oral BID BM  . feeding supplement (PRO-STAT SUGAR FREE 64)  30 mL Per Tube TID  . fluconazole  400 mg Per Tube Q1400  . lidocaine (PF)  5 mL Intradermal Once  . multivitamin  15 mL Per Tube Daily  . nystatin  5 mL Per Tube  QID  . polyvinyl alcohol  1 drop Both Eyes QID  . sulfamethoxazole-trimethoprim  1 tablet Per Tube Daily  . tamsulosin  0.4 mg Oral Daily   Continuous Infusions: . 0.9 % NaCl with KCl 40 mEq / L 50 mL/hr (03/12/17 4008)  . feeding supplement (JEVITY 1.2 CAL) 1,000 mL (03/11/17 0542)     LOS: 25 days   Yazleemar Strassner, MD PhD [facility] Pager [phone]  If 7PM-7AM, please contact night-coverage [email or web address] Password [facility] 03/12/2017, 7:49 AM

## 2017-03-13 LAB — BASIC METABOLIC PANEL
Anion gap: 6 (ref 5–15)
BUN: 13 mg/dL (ref 6–20)
CALCIUM: 8.9 mg/dL (ref 8.9–10.3)
CO2: 22 mmol/L (ref 22–32)
CREATININE: 0.39 mg/dL — AB (ref 0.61–1.24)
Chloride: 105 mmol/L (ref 101–111)
GFR calc non Af Amer: >60 mL/min (ref >60)
Glucose, Bld: 92 mg/dL (ref 65–99)
Potassium: 4.3 mmol/L (ref 3.5–5.1)
SODIUM: 133 mmol/L — AB (ref 135–145)

## 2017-03-13 LAB — MAGNESIUM: MAGNESIUM: 1.5 mg/dL — AB (ref 1.7–2.4)

## 2017-03-13 LAB — GLUCOSE, CAPILLARY
Glucose-Capillary: 76 mg/dL (ref 65–99)
Glucose-Capillary: 86 mg/dL (ref 65–99)
Glucose-Capillary: 132 mg/dL — ABNORMAL HIGH (ref 65–99)
Glucose-Capillary: 105 mg/dL — ABNORMAL HIGH (ref 65–99)
Glucose-Capillary: 86 mg/dL (ref 65–99)

## 2017-03-13 LAB — PHOSPHORUS: PHOSPHORUS: 3.9 mg/dL (ref 2.5–4.6)

## 2017-03-13 MED ORDER — MAGNESIUM SULFATE 4 GM/100ML IV SOLN
4.0000 g | Freq: Once | INTRAVENOUS | Status: AC
  Administered 2017-03-13: 4 g via INTRAVENOUS
  Filled 2017-03-13: qty 100

## 2017-03-13 NOTE — Progress Notes (Signed)
PROGRESS NOTE    William Pena  WUJ:[id number] DOB: [birth year] DOA: 02/15/2017 PCP: [name], MD    Brief Narrative:  31 y.o. 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 Initial impression from ID was that he had relapsed cryptococcal meningitis.  Assessment & Plan:   Principal Problem:   Sepsis ([place]) Active Problems:   AIDS (acquired immune deficiency syndrome) (HCC)   Encephalopathy acute   Meningoencephalitis   HIV (human immunodeficiency virus infection) ([place])   AKI (acute kidney injury) ([place])   Protein-calorie malnutrition, severe   Anemia of chronic disease   Acute encephalopathy   Goals of care, counseling/discussion   Palliative care by specialist   Leukopenia   Deep tissue injury bilateral heels   Wound of buttock   Hypokalemia   Encephalitis   Acute renal failure (HCC)   Encephalopathy   Advance care planning  Principal Problem:   Sepsis ([name] to cryptococcal meningeal encephalitis  -he was on oral dilfucan 400mg  daily since admission, this was changed to iv on 8/6 due to poor oral intake -Will need 8 weeks of diflucan 400mg  therapy per ID (started 02/16/17 with stopping date 04/13/17), and will likely need 200 mg of oral fluconazole therapy for another 10 months to 12 months of treatment. Per last ID notes on 8/6, ok to discharge on oral diflucan 400mg  daily - LP done 8/2 with WBC of 37 and crypto in fluid aspirate.  -  No longer septic, but mental status not improving as of 03/13/2017   Active Problems:   AIDS (acquired immune deficiency syndrome) (HCC)/HIV infection -Had initially been unable to start antiretroviral therapy due to the risk of immune reconstitution inflammatory syndrome per ID.  - Per palliative care, wishes are  for continued full treatment and Full Code. If patient's condition worsens and/or patient requires future hospitalization, then family may consider more palliative approach at that time - Patient is continued on Bactrim daily and azithromycin weekly to prevent opportunistic infections. - ID had discussed with family regarding risk and benefits of ARV. Decision made to start ARV with understanding that patient's condition may worsen or that neuro deficits may never improve despite ARV given concerns of possible anoxic brain injury. - ID had since signed off as of 8/6. Final recommendations from ID for PEG placement , IR consulted and declined PEG placement due to risk over weight benefit in current condition, please see IR note on 8/8.  F - Patient vital remains stable at present    Encephalopathy acute Appears to be related to cryptococcal meningeal encephalitis vs anoxic brain injury.  - No significant change in condition has been noted as of 03/13/2017 - Patient continues to be non-verbal and with staring spells, he had left sided gaze, seems to have right sided neglect     AKI (acute kidney injury) ([place]) - presenting AKI likely secondary to dehydration - Labs were reviewed. Cr peaked at 2.09,  - Renal function had improved, now wnl  Hypokalemia/hypomag/  Hyponatremia - mag 1.5 today, replace mag -stop checking daily labs, changed to bmp/mag every Wednesday and Saturday unless clinical changes    Protein-calorie malnutrition, severe - Patient is continued on supplements.  - Body mass index is 20.3 kg/m.  - Nutrition was consulted. Discussed case with Palliative Care and ID. Recommendations for alternative means of  nutrition/tube feeding to maximize nutrition - Initially discussed with GI who recommended IR consultation. IR declined  PEG placement due to risk overweight benefit, cortrack feeding tube recommended,  please see IR note on 8/8 -cortrack feeding tube placed on 8/8, start tube  feeds, nutrition consulted, watch for refeeding syndrome, Speech eval result on 8/9 noted -awaiting for placement,  case manager and social worker working on placement.  Regular diet ordered as mother reports patient will eat for her but will not eat when others try to feed him. On aspiration precaution.    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  - Continue to transfuse as needed. - Hgb has since remained stable, will stop check cbc daily, change to checking cbc every Wednesday and sat    3 full thickness wounds to bilat buttocks; 2X.5X.5cm, 1X1X1cm, 5.5X3X.2cm - Continue wound care per WOC. Per her notes: "Pt had I&D of buttock abscesses in May with the surgical team, according to the EMR. Thesewounds were present on admission but are NOT pressure injuries." - currently stable    Deep tissue injuries bilateral heels - Wound care per wound care team.  - Patient continued with floating heels as tolerated       DVT prophylaxis: SCD's Code Status: DNR Family Communication: Pt in room Disposition Plan: medically ready to discharge, difficult placement, awaiting on disposition plan  Consultants:   ID (signed off)  Neurology (signed off)  Hematology Dr Marin Olp (signed off)  Palliative Care  IR recommended against peg tube placement   Nutrition  Social worker  Speech/PT/OT  Procedures:   Intermiitent LP's  Antimicrobials: Anti-infectives    Start     Dose/Rate Route Frequency Ordered Stop   03/10/17 1400  fluconazole (DIFLUCAN) 40 MG/ML suspension 400 mg     400 mg Per Tube Daily 03/09/17 1423     03/09/17 2000  sulfamethoxazole-trimethoprim (BACTRIM DS,SEPTRA DS) 800-160 MG per tablet 1 tablet     1 tablet Per Tube Daily 03/08/17 2249     03/06/17 1400  fluconazole (DIFLUCAN) IVPB 400 mg     400 mg 100 mL/hr over 120 Minutes  Intravenous Every 24 hours 03/05/17 1519 03/09/17 1608   03/03/17 1430  emtricitabine-tenofovir AF (DESCOVY) 200-25 MG per tablet 1 tablet     1 tablet Oral Daily 03/03/17 1324     03/03/17 1330  dolutegravir (TIVICAY) tablet 50 mg     50 mg Oral Daily 03/03/17 1324     03/03/17 1200  fluconazole (DIFLUCAN) tablet 400 mg  Status:  Discontinued     400 mg Oral Daily 03/03/17 1133 03/05/17 1519   02/22/17 2000  azithromycin (ZITHROMAX) tablet 1,200 mg     1,200 mg Oral Weekly 02/22/17 1812     02/22/17 2000  sulfamethoxazole-trimethoprim (BACTRIM DS,SEPTRA DS) 800-160 MG per tablet 1 tablet  Status:  Discontinued     1 tablet Oral Daily 02/22/17 1812 03/08/17 2249   02/17/17 1400  levofloxacin (LEVAQUIN) IVPB 750 mg  Status:  Discontinued     750 mg 100 mL/hr over 90 Minutes Intravenous Every 48 hours 02/15/17 1515 02/16/17 1043   02/17/17 1000  azithromycin (ZITHROMAX) tablet 1,200 mg  Status:  Discontinued     1,200 mg Oral Every Fri 02/15/17 1615 02/16/17 1308   02/16/17 1330  ceFEPIme (MAXIPIME) 2 g in dextrose 5 % 50 mL IVPB  Status:  Discontinued     2  g 100 mL/hr over 30 Minutes Intravenous Every 24 hours 02/15/17 1515 02/16/17 1043   02/16/17 1200  fluconazole (DIFLUCAN) IVPB 400 mg  Status:  Discontinued     400 mg 100 mL/hr over 120 Minutes Intravenous Every 24 hours 02/16/17 1048 03/03/17 1133   02/16/17 1000  fluconazole (DIFLUCAN) tablet 400 mg  Status:  Discontinued     400 mg Oral Daily 02/15/17 1615 02/16/17 1048   02/16/17 1000  sulfamethoxazole-trimethoprim (BACTRIM DS,SEPTRA DS) 800-160 MG per tablet 1 tablet  Status:  Discontinued     1 tablet Oral Daily 02/15/17 1615 02/16/17 1308   02/16/17 0200  vancomycin (VANCOCIN) 500 mg in sodium chloride 0.9 % 100 mL IVPB  Status:  Discontinued     500 mg 100 mL/hr over 60 Minutes Intravenous Every 12 hours 02/15/17 1515 02/16/17 1043   02/15/17 1315  ceFEPIme (MAXIPIME) 2 g in dextrose 5 % 50 mL IVPB     2 g 100 mL/hr over 30  Minutes Intravenous  Once 02/15/17 1307 02/15/17 1436   02/15/17 1300  levofloxacin (LEVAQUIN) IVPB 750 mg     750 mg 100 mL/hr over 90 Minutes Intravenous  Once 02/15/17 1254 02/15/17 1533   02/15/17 1300  aztreonam (AZACTAM) 2 g in dextrose 5 % 50 mL IVPB  Status:  Discontinued     2 g 100 mL/hr over 30 Minutes Intravenous  Once 02/15/17 1254 02/15/17 1307   02/15/17 1300  vancomycin (VANCOCIN) IVPB 1000 mg/200 mL premix     1,000 mg 200 mL/hr over 60 Minutes Intravenous  Once 02/15/17 1254 02/15/17 1436      Subjective: No significant mental status improvement, Remain Nonverbal , open eyes spontaneously, left side gaze,  cortrack tube in nare. Tolerating tube feeds so far. Indwelling foley  (last changed on 03/01/2017)with clear urine   Objective: Vitals:   03/12/17 1507 03/12/17 2100 03/13/17 0418 03/13/17 0419  BP: 101/69 127/66 131/75   Pulse: (!) 51 (!) 56 (!) 58   Resp: 16 16 16    Temp: 97.6 F (36.4 C) (!) 97.5 F (36.4 C) (!) 97.5 F (36.4 C)   TempSrc: Oral Oral Axillary   SpO2: 100% 100% 100%   Weight:    40.8 kg (90 lb)  Height:        Intake/Output Summary (Last 24 hours) at 03/13/17 0925 Last data filed at 03/13/17 0600  Gross per 24 hour  Intake             1160 ml  Output              500 ml  Net              660 ml   Filed Weights   03/11/17 0436 03/12/17 0500 03/13/17 0419  Weight: 40.1 kg (88 lb 6.5 oz) 41.3 kg (91 lb) 40.8 kg (90 lb)    Examination: General exam: Awake,open eyes spontaneously, left side gaze, does not follow commands, nonverbal, malnourished, chronically ill appearing, does not appear in acute distress, cortrack tube in nare. Respiratory system: Normal respiratory effort, no wheezing Cardiovascular system: regular rate, s1, s2 Gastrointestinal system: Soft, nondistended, positive BS, well healed midline scar from remote surgery Central nervous system: open eyes spontaneously, left side gaze, does not commands, nonverbal, right  upper arm contacted, left arm with some spontaneous movement, lower extremity with heal protactors on, not much movement noticed, seems have right sided neglect. Extremities:  no edema, bilateral heal protector on, left lower extremity  with spontaneous movement when touched, now movement on left side, left upper arm contacted Skin: Normal skin turgor, no notable skin lesions seen Psychiatry: difficult to assess given non-verbal state  Data Reviewed: I have personally reviewed following labs and imaging studies  CBC:  Recent Labs Lab 03/08/17 0300 03/11/17 1825  WBC 7.3 4.4  HGB 9.8* 10.2*  HCT 30.4* 30.9*  MCV 84.9 85.1  PLT 125* 300   Basic Metabolic Panel:  Recent Labs Lab 03/09/17 0445 03/10/17 0332 03/11/17 0402 03/11/17 1825 03/13/17 0316  NA 133* 135 136 132* 133*  K 4.9 3.9 4.4 4.4 4.3  CL 107 107 108 105 105  CO2 16* 20* 20* 21* 22  GLUCOSE 72 93 104* 97 92  BUN 12 15 14 11 13   CREATININE 0.53* 0.44* 0.50* 0.49* 0.39*  CALCIUM 8.8* 8.6* 8.6* 8.6* 8.9  MG 1.5* 1.6* 1.7  --  1.5*  PHOS 4.2 3.8  --   --  3.9   GFR: Estimated Creatinine Clearance: 77.2 mL/min (A) (by C-G formula based on SCr of 0.39 mg/dL (L)). Liver Function Tests: No results for input(s): AST, ALT, ALKPHOS, BILITOT, PROT, ALBUMIN in the last 168 hours. No results for input(s): LIPASE, AMYLASE in the last 168 hours. No results for input(s): AMMONIA in the last 168 hours. Coagulation Profile: No results for input(s): INR, PROTIME in the last 168 hours. Cardiac Enzymes: No results for input(s): CKTOTAL, CKMB, CKMBINDEX, TROPONINI in the last 168 hours. BNP (last 3 results) No results for input(s): PROBNP in the last 8760 hours. HbA1C: No results for input(s): HGBA1C in the last 72 hours. CBG:  Recent Labs Lab 03/12/17 1723 03/12/17 2010 03/12/17 2357 03/13/17 0402 03/13/17 0810  GLUCAP 81 134* 87 76 86   Lipid Profile: No results for input(s): CHOL, HDL, LDLCALC, TRIG, CHOLHDL,  LDLDIRECT in the last 72 hours. Thyroid Function Tests: No results for input(s): TSH, T4TOTAL, FREET4, T3FREE, THYROIDAB in the last 72 hours. Anemia Panel: No results for input(s): VITAMINB12, FOLATE, FERRITIN, TIBC, IRON, RETICCTPCT in the last 72 hours. Sepsis Labs: No results for input(s): PROCALCITON, LATICACIDVEN in the last 168 hours.  No results found for this or any previous visit (from the past 240 hour(s)).   Radiology Studies: No results found.  Scheduled Meds: . amantadine  100 mg Per Tube BID WC  . azithromycin  1,200 mg Oral Weekly  . dolutegravir  50 mg Oral Daily  . emtricitabine-tenofovir AF  1 tablet Oral Daily  . feeding supplement (ENSURE ENLIVE)  237 mL Oral BID BM  . feeding supplement (PRO-STAT SUGAR FREE 64)  30 mL Per Tube TID  . fluconazole  400 mg Per Tube Q1400  . lidocaine (PF)  5 mL Intradermal Once  . multivitamin  15 mL Per Tube Daily  . nystatin  5 mL Per Tube QID  . polyvinyl alcohol  1 drop Both Eyes QID  . sulfamethoxazole-trimethoprim  1 tablet Per Tube Daily  . tamsulosin  0.4 mg Oral Daily   Continuous Infusions: . 0.9 % NaCl with KCl 40 mEq / L 50 mL/hr (03/13/17 0355)  . feeding supplement (JEVITY 1.2 CAL) 1,000 mL (03/11/17 0542)  . magnesium sulfate 1 - 4 g bolus IVPB       LOS: 26 days   William Hornbaker, MD PhD [facility] Pager [phone]  If 7PM-7AM, please contact night-coverage [email or web address] Password [facility] 03/13/2017, 9:25 AM

## 2017-03-13 NOTE — Progress Notes (Signed)
CM received consult: Medically ready to discharge from hospital, difficult placement due to no insurance, any charity service ? Pt without insurance and difficult to place 2/2 to cortrak tube. CM has placed call to mom to discuss potential d/c plan, home with home health services vs SNF. [facility] approval can provide [facility] health services (RN), charity case. Pt difficult to place in SNF 2/2 cortrak tube....CSW following. CM awaiting call back from mom. Whitman Hero RN,BSN,CM

## 2017-03-13 NOTE — Clinical Social Work Note (Addendum)
Per MD, patient is stable for discharge but continues to have a cortrack and will need it for 4-6 more weeks. Patient was admitted from [facility] under "difficult to place." [facility] admissions coordinator states they are unable to take patient with cortrack. CSW waiting on call back from Surveyor, quantity of social work to discuss.  Dayton Scrape, [place] [phone]  2:15 pm Admissions coordinator at [facility] confirmed they cannot take patients with cortracks. CSW confirmed with Surveyor, quantity of social work that no facility will take patient with a cortrack.  Dayton Scrape, [place]

## 2017-03-13 NOTE — Progress Notes (Signed)
Physical Therapy Treatment Patient Details Name: William Pena MRN: [id number] DOB: [birth year] Today's Date: 03/13/2017    History of Present Illness Pt is a 31 y/o male admitted 7/18 from SNF for worsening confusion and sepsis. PMH includes HIV/AIDS, pressure wounds on heels and buttocks, perirectal abscess, cryptococcal menigioencephalopathy, AKI, and non verbal at baseline. Palliative care following; mother wishing to proceed with medical care vs. comfort. NG tube in place.     PT Comments    Pt requiring max multimodal cues to open eyes during session and does not follow commands. Required total assist +2 for bed mobilities and sitting balance at EOB. Pt appropriate for SNF at d/c. Will continue to follow acutely.   Follow Up Recommendations  SNF     Equipment Recommendations  None recommended by PT    Recommendations for Other Services       Precautions / Restrictions Precautions Precautions: Fall Precaution Comments: NG tube Required Braces or Orthoses: Other Brace/Splint Other Brace/Splint: bilat PRAFO Restrictions Weight Bearing Restrictions: No    Mobility  Bed Mobility Overal bed mobility: Needs Assistance Bed Mobility: Supine to Sit;Sit to Supine;Rolling Rolling: Total assist;+2 for physical assistance   Supine to sit: Total assist;+2 for physical assistance Sit to supine: Total assist;+2 for physical assistance   General bed mobility comments: Total Assist +2 for bed mobility. Pt not initiating movement to assist with bed mobility.   Transfers                 General transfer comment: Unsafe to attempt at this time  Ambulation/Gait                 Stairs            Wheelchair Mobility    Modified Rankin (Stroke Patients Only)       Balance Overall balance assessment: Needs assistance Sitting-balance support: No upper extremity supported;Feet supported Sitting balance-Leahy Scale: Zero Sitting balance - Comments: Sat EOB  for ~5 min. Attempted to get pt to follow commands to move UE/LE, however, pt unable. Pt leaning posterior and Left. Total assist to maintain balance. Tone vs. pt resistance in UE. Postural control: Posterior lean;Left lateral lean                                  Cognition Arousal/Alertness: Lethargic Behavior During Therapy: Flat affect Overall Cognitive Status: No family/caregiver present to determine baseline cognitive functioning                                 General Comments: Non verbal at baseline. Pt requiring multiple cues to open eyes throughout session. Not following verbal commands during session.       Exercises      General Comments General comments (skin integrity, edema, etc.): No family present during session. Will need to follow up with family to determine PLOF.       Pertinent Vitals/Pain Pain Assessment: Faces Faces Pain Scale: Hurts little more (when rising into sitting) Pain Location: q Pain Descriptors / Indicators: Grimacing;Other (Comment) (eyes widening, ) Pain Intervention(s): Monitored during session;Limited activity within patient's tolerance    Home Living                      Prior Function            PT Goals (current goals  can now be found in the care plan section) Acute Rehab PT Goals Patient Stated Goal: not able to state  PT Goal Formulation: Patient unable to participate in goal setting Time For Goal Achievement: 03/17/17 Potential to Achieve Goals: Fair Progress towards PT goals: Not progressing toward goals - comment (Pt unable to follow commands )    Frequency    Min 2X/week      PT Plan Current plan remains appropriate    Co-evaluation              AM-PAC PT "6 Clicks" Daily Activity  Outcome Measure  Difficulty turning over in bed (including adjusting bedclothes, sheets and blankets)?: Total Difficulty moving from lying on back to sitting on the side of the bed? :  Total Difficulty sitting down on and standing up from a chair with arms (e.g., wheelchair, bedside commode, etc,.)?: Total Help needed moving to and from a bed to chair (including a wheelchair)?: Total Help needed walking in hospital room?: Total Help needed climbing 3-5 steps with a railing? : Total 6 Click Score: 6    End of Session   Activity Tolerance: Patient tolerated treatment well Patient left: in bed;with call bell/phone within reach;with nursing/sitter in room Nurse Communication: Mobility status PT Visit Diagnosis: Muscle weakness (generalized) (M62.81);Other symptoms and signs involving the nervous system (R29.898);Difficulty in walking, not elsewhere classified (R26.2)     Time: 2585-2778 PT Time Calculation (min) (ACUTE ONLY): 24 min  Charges:  $Therapeutic Activity: 23-37 mins                    G Codes:       Benjiman Core, [place] Pager [phone] Acute Rehab   Allena Katz 03/13/2017, 3:54 PM

## 2017-03-13 NOTE — Progress Notes (Signed)
  Speech Language Pathology Treatment: Dysphagia  Patient Details Name: William Pena MRN: [id number] DOB: 09/26/1985 Today's Date: 03/13/2017 Time: 0100-7121 SLP Time Calculation (min) (ACUTE ONLY): 16 min  Assessment / Plan / Recommendation Clinical Impression  Pt alert, not following verbal commands but accepting po's once spoon touched to lower lip. Unable to express thin via straw; accepting via spoon with bilateral labial spill. Delayed cough x 2 in between bites/sips. No change in vitals, RN documentation of lung sounds. Orally manipulated and propelled oatmeal. [name] on a regular texture diet so mom is able to order foods he can tolerate. ST to continue to follow.    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 He was recently here 9/75-8/8 with a complicated course due to disseminated cryptococcal infection and cryptococcal meningoencephalitis. Pt was seen by SLP at that time, intermtitently had poor arousal and intake but demonstrated tolerance of regluar textures and thin liquids by the end of his admission.  He now presents from SNF with worsening encephalopathy and decreased oral intake. He was admitted on 02/15/2017 but SLP was not consulted until 03/08/17. His mother reports he takes PO from her, but will not eat when others try to feed him. Palliative care has been involved, but mother still wants full scope of care and is considering a PEG tube, but there are some concerns about placement.       SLP Plan  Continue with current plan of care       Recommendations  Diet recommendations: Regular;Thin liquid Liquids provided via: Cup;Teaspoon Medication Administration: Whole meds with puree Supervision: Staff to assist with self feeding;Full  supervision/cueing for compensatory strategies Compensations: Minimize environmental distractions;Slow rate;Small sips/bites Postural Changes and/or Swallow Maneuvers: Seated upright 90 degrees                Oral Care Recommendations: Oral care BID Follow up Recommendations: Skilled Nursing facility SLP Visit Diagnosis: Dysphagia, unspecified (R13.10) Plan: Continue with current plan of care       GO                Houston Siren 03/13/2017, 8:45 AM  Orbie Pyo Colvin Caroli.Ed [employer] [phone]

## 2017-03-14 ENCOUNTER — Ambulatory Visit: Admitting: Internal Medicine

## 2017-03-14 LAB — GLUCOSE, CAPILLARY
GLUCOSE-CAPILLARY: 100 mg/dL — AB (ref 65–99)
Glucose-Capillary: 100 mg/dL — ABNORMAL HIGH (ref 65–99)
Glucose-Capillary: 106 mg/dL — ABNORMAL HIGH (ref 65–99)
Glucose-Capillary: 85 mg/dL (ref 65–99)
Glucose-Capillary: 89 mg/dL (ref 65–99)
Glucose-Capillary: 94 mg/dL (ref 65–99)

## 2017-03-14 NOTE — Progress Notes (Addendum)
PROGRESS NOTE    William Pena  EXB:[id number] DOB: February 18, 1986 DOA: 02/15/2017 PCP: [name], MD    Brief Narrative:  31 y.o. 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 Initial impression from ID was that he had relapsed cryptococcal meningitis.  [name]  Started on diflucan, s/p serial LP, now stable, not septic anymore,  he is started on HIV meds,  ID has since signed off. Not a candidate for peg placement per IR.   Hospice vs snf placement on 8/15   Assessment & Plan:   Principal Problem:   Sepsis ([place]) Active Problems:   AIDS (acquired immune deficiency syndrome) ([place])   Encephalopathy acute   Meningoencephalitis   HIV (human immunodeficiency virus infection) ([place])   AKI (acute kidney injury) ([place])   Protein-calorie malnutrition, severe   Anemia of chronic disease   Acute encephalopathy   Goals of care, counseling/discussion   Palliative care by specialist   Leukopenia   Deep tissue injury bilateral heels   Wound of buttock   Hypokalemia   Encephalitis   Acute renal failure (HCC)   Encephalopathy   Advance care planning  Principal Problem:   Sepsis ([name] to cryptococcal meningeal encephalitis  -he was on oral dilfucan 400mg  daily since admission, this was changed to iv on 8/6 due to poor oral intake -Will need 8 weeks of diflucan 400mg  therapy per ID (started 02/16/17 with stopping date 04/13/17), and will likely need 200 mg of oral fluconazole therapy for another 10 months to 12 months of treatment. Per last ID notes on 8/6, ok to discharge on oral diflucan 400mg  daily - LP done 8/2 with WBC of 37 and crypto in fluid aspirate.  -  No longer septic, but mental status not improving as of 03/14/2017   Active Problems:  AIDS (acquired immune deficiency syndrome) (HCC)/HIV infection -Had initially been unable to start antiretroviral therapy due to the risk of immune reconstitution inflammatory syndrome per ID.  - Per palliative care, wishes are for continued full treatment and Full Code. If patient's condition worsens and/or patient requires future hospitalization, then family may consider more palliative approach at that time - Patient is continued on Bactrim daily and azithromycin weekly to prevent opportunistic infections. - ID had discussed with family regarding risk and benefits of ARV. Decision made to start ARV with understanding that patient's condition may worsen or that neuro deficits may never improve despite ARV given concerns of possible anoxic brain injury. - ID had since signed off as of 8/6. Final recommendations from ID for PEG placement , IR consulted and declined PEG placement due to risk over weight benefit in current condition, please see IR note on 8/8.  F - Patient vital remains stable at present    Encephalopathy acute Appears to be related to cryptococcal meningeal encephalitis vs anoxic brain injury.  - No significant change in condition has been noted as of 03/14/2017 - Patient continues to be non-verbal and with staring spells, he had left sided gaze, seems to have right sided neglect     AKI (acute kidney injury) ([place]) - presenting AKI likely secondary to dehydration - Labs were reviewed. Cr peaked at 2.09,  - Renal function had improved, now wnl  Hypokalemia/hypomag/  Hyponatremia - mag 1.5 today, replace mag -stop checking daily labs, changed to  bmp/mag every Wednesday and Saturday unless clinical changes    Protein-calorie malnutrition, severe - Patient is continued on supplements.  - Body mass index is 20.3 kg/m.  - Nutrition was consulted. Discussed case with Palliative Care and ID. Recommendations for alternative means of nutrition/tube feeding to maximize nutrition -  Initially discussed with GI who recommended IR consultation. IR declined  PEG placement due to risk overweight benefit, cortrack feeding tube recommended,  please see IR note on 8/8 -cortrack feeding tube placed on 8/8, start tube feeds, nutrition consulted, watch for refeeding syndrome, Speech eval result on 8/9 noted -awaiting for placement,  case manager and social worker working on placement.  Regular diet ordered as mother reports patient will eat for her but will not eat when others try to feed him. On aspiration precaution.    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  - Continue to transfuse as needed. - Hgb has since remained stable, will stop check cbc daily, change to checking cbc every Wednesday and sat    3 full thickness wounds to bilat buttocks; 2X.5X.5cm, 1X1X1cm, 5.5X3X.2cm - Continue wound care per WOC. Per her notes: "Pt had I&D of buttock abscesses in May with the surgical team, according to the EMR. Thesewounds were present on admission but are NOT pressure injuries." - currently stable    Deep tissue injuries bilateral heels - Wound care per wound care team.  - Patient continued with floating heels as tolerated       DVT prophylaxis: SCD's Code Status: DNR Family Communication: PT and mother in room Disposition Plan: medically ready to discharge, difficult placement, awaiting on disposition plan Case manager /social worker/ palliative care/hospice to continue to discuss with family, mother likely able to make decision on 8/15 regarding tube feeds, snf vs hospice.   Consultants:   ID (signed off)  Neurology (signed off)  Hematology Dr Marin Olp (signed off)  Palliative Care  IR recommended against peg tube placement   Nutrition  Social worker  Speech/PT/OT  Procedures:   Intermiitent  LP's  Antimicrobials: Anti-infectives    Start     Dose/Rate Route Frequency Ordered Stop   03/10/17 1400  fluconazole (DIFLUCAN) 40 MG/ML suspension 400 mg     400 mg Per Tube Daily 03/09/17 1423     03/09/17 2000  sulfamethoxazole-trimethoprim (BACTRIM DS,SEPTRA DS) 800-160 MG per tablet 1 tablet     1 tablet Per Tube Daily 03/08/17 2249     03/06/17 1400  fluconazole (DIFLUCAN) IVPB 400 mg     400 mg 100 mL/hr over 120 Minutes Intravenous Every 24 hours 03/05/17 1519 03/09/17 1608   03/03/17 1430  emtricitabine-tenofovir AF (DESCOVY) 200-25 MG per tablet 1 tablet     1 tablet Oral Daily 03/03/17 1324     03/03/17 1330  dolutegravir (TIVICAY) tablet 50 mg     50 mg Oral Daily 03/03/17 1324     03/03/17 1200  fluconazole (DIFLUCAN) tablet 400 mg  Status:  Discontinued     400 mg Oral Daily 03/03/17 1133 03/05/17 1519   02/22/17 2000  azithromycin (ZITHROMAX) tablet 1,200 mg     1,200 mg Oral Weekly 02/22/17 1812     02/22/17 2000  sulfamethoxazole-trimethoprim (BACTRIM DS,SEPTRA DS) 800-160 MG per tablet 1 tablet  Status:  Discontinued     1 tablet Oral Daily 02/22/17 1812 03/08/17 2249   02/17/17 1400  levofloxacin (LEVAQUIN) IVPB 750 mg  Status:  Discontinued     750 mg 100 mL/hr over 90 Minutes Intravenous Every 48 hours 02/15/17 1515 02/16/17 1043   02/17/17 1000  azithromycin (ZITHROMAX) tablet 1,200 mg  Status:  Discontinued     1,200 mg Oral Every Fri 02/15/17 1615 02/16/17 1308   02/16/17 1330  ceFEPIme (MAXIPIME) 2 g in dextrose 5 % 50 mL IVPB  Status:  Discontinued     2 g 100 mL/hr over 30 Minutes Intravenous Every 24 hours 02/15/17 1515 02/16/17 1043   02/16/17 1200  fluconazole (DIFLUCAN) IVPB 400 mg  Status:  Discontinued     400 mg 100 mL/hr over 120 Minutes Intravenous Every 24 hours 02/16/17 1048 03/03/17 1133   02/16/17 1000  fluconazole (DIFLUCAN) tablet 400 mg  Status:  Discontinued     400 mg Oral Daily 02/15/17 1615 02/16/17 1048   02/16/17 1000   sulfamethoxazole-trimethoprim (BACTRIM DS,SEPTRA DS) 800-160 MG per tablet 1 tablet  Status:  Discontinued     1 tablet Oral Daily 02/15/17 1615 02/16/17 1308   02/16/17 0200  vancomycin (VANCOCIN) 500 mg in sodium chloride 0.9 % 100 mL IVPB  Status:  Discontinued     500 mg 100 mL/hr over 60 Minutes Intravenous Every 12 hours 02/15/17 1515 02/16/17 1043   02/15/17 1315  ceFEPIme (MAXIPIME) 2 g in dextrose 5 % 50 mL IVPB     2 g 100 mL/hr over 30 Minutes Intravenous  Once 02/15/17 1307 02/15/17 1436   02/15/17 1300  levofloxacin (LEVAQUIN) IVPB 750 mg     750 mg 100 mL/hr over 90 Minutes Intravenous  Once 02/15/17 1254 02/15/17 1533   02/15/17 1300  aztreonam (AZACTAM) 2 g in dextrose 5 % 50 mL IVPB  Status:  Discontinued     2 g 100 mL/hr over 30 Minutes Intravenous  Once 02/15/17 1254 02/15/17 1307   02/15/17 1300  vancomycin (VANCOCIN) IVPB 1000 mg/200 mL premix     1,000 mg 200 mL/hr over 60 Minutes Intravenous  Once 02/15/17 1254 02/15/17 1436      Subjective: No significant mental status improvement, Remain Nonverbal , open eyes spontaneously, left side gaze,  cortrack tube in nare. Tolerating tube feeds so far. Indwelling foley  (last changed on 03/01/2017)with clear urine Mother at bedside, patient won't open mouth when mother asks him too, but mother report patient does open mouth and eat for her when she feeds him.   Objective: Vitals:   03/13/17 2028 03/14/17 0300 03/14/17 0448 03/14/17 1421  BP: 113/74  110/70 117/76  Pulse: 79  71 86  Resp: 16  16 18   Temp: (!) 97.5 F (36.4 C)  97.8 F (36.6 C) 100.2 F (37.9 C)  TempSrc: Oral  Oral Oral  SpO2: 100%  100% 99%  Weight:  35.8 kg (79 lb)    Height:        Intake/Output Summary (Last 24 hours) at 03/14/17 1535 Last data filed at 03/14/17 0900  Gross per 24 hour  Intake          2252.67 ml  Output             1275 ml  Net           977.67 ml   Filed Weights   03/12/17 0500 03/13/17 0419 03/14/17 0300  Weight:  41.3 kg (91 lb) 40.8 kg (90 lb) 35.8 kg (79 lb)    Examination: General exam: Awake,open eyes spontaneously, left side gaze, does not follow commands, nonverbal, malnourished,  chronically ill appearing, does not appear in acute distress, cortrack tube in nare. Respiratory system: Normal respiratory effort, no wheezing Cardiovascular system: regular rate, s1, s2 Gastrointestinal system: Soft, nondistended, positive BS, well healed midline scar from remote surgery Central nervous system: open eyes spontaneously, left side gaze, does not commands, nonverbal, right upper arm contacted, left arm with some spontaneous movement, lower extremity with heal protactors on, not much movement noticed, seems have right sided neglect. Extremities:  no edema, bilateral heal protector on, left lower extremity with spontaneous movement when touched, now movement on left side, left upper arm contacted Skin: Normal skin turgor, no notable skin lesions seen Psychiatry: difficult to assess given non-verbal state  Data Reviewed: I have personally reviewed following labs and imaging studies  CBC:  Recent Labs Lab 03/08/17 0300 03/11/17 1825  WBC 7.3 4.4  HGB 9.8* 10.2*  HCT 30.4* 30.9*  MCV 84.9 85.1  PLT 125* 124   Basic Metabolic Panel:  Recent Labs Lab 03/09/17 0445 03/10/17 0332 03/11/17 0402 03/11/17 1825 03/13/17 0316  NA 133* 135 136 132* 133*  K 4.9 3.9 4.4 4.4 4.3  CL 107 107 108 105 105  CO2 16* 20* 20* 21* 22  GLUCOSE 72 93 104* 97 92  BUN 12 15 14 11 13   CREATININE 0.53* 0.44* 0.50* 0.49* 0.39*  CALCIUM 8.8* 8.6* 8.6* 8.6* 8.9  MG 1.5* 1.6* 1.7  --  1.5*  PHOS 4.2 3.8  --   --  3.9   GFR: Estimated Creatinine Clearance: 67.7 mL/min (A) (by C-G formula based on SCr of 0.39 mg/dL (L)). Liver Function Tests: No results for input(s): AST, ALT, ALKPHOS, BILITOT, PROT, ALBUMIN in the last 168 hours. No results for input(s): LIPASE, AMYLASE in the last 168 hours. No results for  input(s): AMMONIA in the last 168 hours. Coagulation Profile: No results for input(s): INR, PROTIME in the last 168 hours. Cardiac Enzymes: No results for input(s): CKTOTAL, CKMB, CKMBINDEX, TROPONINI in the last 168 hours. BNP (last 3 results) No results for input(s): PROBNP in the last 8760 hours. HbA1C: No results for input(s): HGBA1C in the last 72 hours. CBG:  Recent Labs Lab 03/13/17 2026 03/14/17 0004 03/14/17 0446 03/14/17 0751 03/14/17 1155  GLUCAP 86 100* 94 100* 106*   Lipid Profile: No results for input(s): CHOL, HDL, LDLCALC, TRIG, CHOLHDL, LDLDIRECT in the last 72 hours. Thyroid Function Tests: No results for input(s): TSH, T4TOTAL, FREET4, T3FREE, THYROIDAB in the last 72 hours. Anemia Panel: No results for input(s): VITAMINB12, FOLATE, FERRITIN, TIBC, IRON, RETICCTPCT in the last 72 hours. Sepsis Labs: No results for input(s): PROCALCITON, LATICACIDVEN in the last 168 hours.  No results found for this or any previous visit (from the past 240 hour(s)).   Radiology Studies: No results found.  Scheduled Meds: . amantadine  100 mg Per Tube BID WC  . azithromycin  1,200 mg Oral Weekly  . dolutegravir  50 mg Oral Daily  . emtricitabine-tenofovir AF  1 tablet Oral Daily  . feeding supplement (ENSURE ENLIVE)  237 mL Oral BID BM  . feeding supplement (PRO-STAT SUGAR FREE 64)  30 mL Per Tube TID  . fluconazole  400 mg Per Tube Q1400  . lidocaine (PF)  5 mL Intradermal Once  . multivitamin  15 mL Per Tube Daily  . nystatin  5 mL Per Tube QID  . polyvinyl alcohol  1 drop Both Eyes QID  . sulfamethoxazole-trimethoprim  1 tablet Per Tube Daily  . tamsulosin  0.4  mg Oral Daily   Continuous Infusions: . 0.9 % NaCl with KCl 40 mEq / L 50 mL/hr (03/14/17 0542)  . feeding supplement (JEVITY 1.2 CAL) 1,000 mL (03/13/17 2200)     LOS: 27 days    More than 50% time spent on coordination of care, case discussed with mother, case Freight forwarder, social worker and palliative  care in person.  Florencia Reasons, MD PhD [facility] Pager [phone]  If 7PM-7AM, please contact night-coverage [email or web address] Password [facility] 03/14/2017, 3:35 PM

## 2017-03-14 NOTE — Progress Notes (Signed)
Occupational Therapy Treatment Patient Details Name: William Pena MRN: [id number] DOB: January 30, 1986 Today's Date: 03/14/2017    History of present illness Pt is a 31 y/o male admitted 7/18 from SNF for worsening confusion and sepsis. PMH includes HIV/AIDS, pressure wounds on heels and buttocks, perirectal abscess, cryptococcal menigioencephalopathy, AKI, and non verbal at baseline. Palliative care following; mother wishing to proceed with medical care vs. comfort. NG tube in place.    OT comments  Pt's mother present during session providing additional information regarding pt PLOF.  Pt has required +2 for self-care and mobility "since last hospitalization".  Pt with flexor tone in RUE > LUE with pt able to relax flexion when seated upright.  Attempted engaging in grooming tasks with washing face and PROM of BUE.  Required hand over hand to attempt to wash face with pt resisting vs tone.  Pt able to reach up towards face without cues.  Unable to extend Lt elbow > 90 degrees flexion, maintains grasp on items when placed in hand.  Rt arm remained fully flexed with inability to extend.  Pt would visually track therapist to midline and demonstrated intermittent "smirks" during treatment but no purposeful movement or participation to follow multimodal directions.  Pt will continue to benefit from OT acutely with +2 for any OOB activity to prepare for d/c to next venue of care.  Follow Up Recommendations  SNF    Equipment Recommendations  Other (comment) (TBD in next venue of care)    Recommendations for Other Services      Precautions / Restrictions Precautions Precautions: Fall Precaution Comments: NG tube Required Braces or Orthoses: Other Brace/Splint Other Brace/Splint: bilat PRAFO Restrictions Weight Bearing Restrictions: No              ADL either performed or assessed with clinical judgement   ADL Overall ADL's : Needs assistance/impaired Eating/Feeding: Total assistance;Bed  level Eating/Feeding Details (indicate cue type and reason): NG tube  Grooming: Total assistance;Bed level Grooming Details (indicate cue type and reason): attempted hand over hand to address ROM and following one step commands Upper Body Bathing: Total assistance;Bed level   Lower Body Bathing: Total assistance;Bed level   Upper Body Dressing : Total assistance;Bed level   Lower Body Dressing: Total assistance;Bed level       Toileting- Clothing Manipulation and Hygiene: Total assistance;Bed level Toileting - Clothing Manipulation Details (indicate cue type and reason): foley      Functional mobility during ADLs: Total assistance;+2 for physical assistance;+2 for safety/equipment       Vision   Additional Comments: Pt maintained eyes open throughout session.  Would track therapist body to midline with additional eye shifts.  Would maintain eye contact when in Lt visual field          Cognition Arousal/Alertness: Awake/alert Behavior During Therapy: Flat affect Overall Cognitive Status: History of cognitive impairments - at baseline                                 General Comments: Unable to follow verbal or tactile cues during session, did maintain eyes open throughout.  Pt's mother present and reports pt nonverbal (not sure how long) but would communicate intermittently with use of blinking for yes.  Pt unable to follow directions to blink in response to therapist this session.                   Pertinent Vitals/ Pain  Pain Assessment: Faces Faces Pain Scale: Hurts a little bit Pain Descriptors / Indicators: Grimacing Pain Intervention(s): Limited activity within patient's tolerance;Monitored during session         Frequency  Min 2X/week        Progress Toward Goals  OT Goals(current goals can now be found in the care plan section)  Progress towards OT goals: Progressing toward goals  Acute Rehab OT Goals Patient Stated Goal: not  able to state  OT Goal Formulation: Patient unable to participate in goal setting Time For Goal Achievement: 03/24/17 Potential to Achieve Goals: [place] Discharge plan remains appropriate       AM-PAC PT "6 Clicks" Daily Activity     Outcome Measure   Help from another person eating meals?: Total Help from another person taking care of personal grooming?: Total Help from another person toileting, which includes using toliet, bedpan, or urinal?: Total Help from another person bathing (including washing, rinsing, drying)?: Total Help from another person to put on and taking off regular upper body clothing?: Total Help from another person to put on and taking off regular lower body clothing?: Total 6 Click Score: 6    End of Session    OT Visit Diagnosis: Muscle weakness (generalized) (M62.81);Other symptoms and signs involving cognitive function;Other abnormalities of gait and mobility (R26.89)   Activity Tolerance Treatment limited secondary to medical complications (Comment)   Patient Left in bed;with call bell/phone within reach;with SCD's reapplied   Nurse Communication Mobility status        Time: 8341-9622 OT Time Calculation (min): 25 min  Charges: OT General Charges $OT Visit: 1 Procedure OT Treatments $Self Care/Home Management : 23-37 mins   [place], [place], [place], OTR/L, [phone] 03/14/2017, 11:23 AM

## 2017-03-14 NOTE — Progress Notes (Signed)
Daily Progress Note   Patient Name: William Pena       Date: 03/14/2017 DOB: [birth year]  Age: 31 y.o. MRN#: [id number] Attending Physician: Florencia Reasons, MD Primary Care Physician: [name], MD Admit Date: 02/15/2017  Reason for Consultation/Follow-up: Establishing goals of care, Interfamily conflict, Inpatient hospice referral and Psychosocial/spiritual support  Subjective: Kross remains unchanged from my prior visits. Cortrak feedings continue. After talking with CM [name] is now considering transitioning to Hospice. She is very tearful and overwhelmed and asked me to help her understand the options.   Length of Stay: 27  Current Medications: Scheduled Meds:  . amantadine  100 mg Per Tube BID WC  . azithromycin  1,200 mg Oral Weekly  . dolutegravir  50 mg Oral Daily  . emtricitabine-tenofovir AF  1 tablet Oral Daily  . feeding supplement (ENSURE ENLIVE)  237 mL Oral BID BM  . feeding supplement (PRO-STAT SUGAR FREE 64)  30 mL Per Tube TID  . fluconazole  400 mg Per Tube Q1400  . lidocaine (PF)  5 mL Intradermal Once  . multivitamin  15 mL Per Tube Daily  . nystatin  5 mL Per Tube QID  . polyvinyl alcohol  1 drop Both Eyes QID  . sulfamethoxazole-trimethoprim  1 tablet Per Tube Daily  . tamsulosin  0.4 mg Oral Daily    Continuous Infusions: . 0.9 % NaCl with KCl 40 mEq / L 50 mL/hr (03/14/17 0542)  . feeding supplement (JEVITY 1.2 CAL) 1,000 mL (03/13/17 2200)    PRN Meds: acetaminophen **OR** acetaminophen, HYDROcodone-acetaminophen, labetalol, polyethylene glycol, polyvinyl alcohol  Physical Exam   Constitution: Frail man lying in bed. HENT:  Head:  Mucous membranes moist. Right sided facial weakness. Nose: Nose normal.  Cortrak present Eyes:  Right pupil slightly larger than left. Will track me intermittently but  seems to be staring through me rather than looking at me.  Neck: Normal range of motion. Neck supple.  Pulmonary/Chest: Effort normal. Breathing comfortably in bed, no signs of distress.  Musculoskeletal:  Left arm with non-purposeful movement. Right arm contracted. No movement noted in BLE. Significant muscle wasting.  Neurological:  Pt will rouse to verbal stimulation but quickly falls back asleep. Not speaking or following commands. Not blinking on command consistently. He does blink to threat. Skin: Skin is warm and dry.  Extensive tatoos  Psychiatric:  Calm in bed, otherwise non-interactive         Vital Signs: BP 110/70 (BP Location: Right Arm)   Pulse 71   Temp 97.8 F (36.6 C) (Oral)   Resp 16   Ht _0  (1.676 m)   Wt 35.8 kg (79 lb)   SpO2 100%   BMI 12.75 kg/m  SpO2: SpO2: 100 % O2 Device: O2 Device: Not Delivered O2 Flow Rate: O2 Flow Rate (L/min): 2 L/min  Intake/output summary:   Intake/Output Summary (Last 24 hours) at 03/14/17 1348 Last data filed at 03/14/17 0900  Gross per 24 hour  Intake          2252.67 ml  Output             2075 ml  Net  177.67 ml   LBM: Last BM Date: 03/12/17 Baseline Weight: Weight: 57.1 kg (125 lb 12.8 oz) Most recent weight: Weight: 35.8 kg (79 lb)  Palliative Assessment/Data: PPS 30%   Flowsheet Rows     Most Recent Value  Intake Tab  Referral Department  Hospitalist  Unit at Time of Referral  ICU  Palliative Care Primary Diagnosis  Sepsis/Infectious Disease  Date Notified  02/21/17  Palliative Care Type  New Palliative care  Reason for referral  Clarify Goals of Care  Date of Admission  02/15/17  # of days IP prior to Palliative referral  6  Clinical Assessment  Psychosocial & Spiritual Assessment  Palliative Care Outcomes      Patient Active Problem List   Diagnosis Date Noted  . Encephalopathy   . Advance care planning   . Acute renal failure ([place])   . Hypokalemia 02/23/2017  . Encephalitis   .  Leukopenia 02/22/2017  . Deep tissue injury bilateral heels 02/22/2017  . Wound of buttock 02/22/2017  . Goals of care, counseling/discussion   . Palliative care by specialist   . Sepsis ([place]) 02/16/2017  . Acute encephalopathy 02/15/2017  . Acute urinary retention 02/08/2017  . Genital herpes 02/08/2017  . Anemia of chronic disease 02/08/2017  . Constipation 02/08/2017  . Protein-calorie malnutrition, severe 01/28/2017  . Pressure injury of skin 01/16/2017  . AKI (acute kidney injury) ([place])   . HIV (human immunodeficiency virus infection) ([place])   . Meningitis   . 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 Acute blood loss anemia   . Elevated intracranial pressure   . HIV disease ([place])   . Lymphadenopathy of head and neck   . Rectal abscess   . Meningoencephalitis   . 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 Cavitary pneumonia   . Diffuse lymphadenopathy   . Drug rash   . Hypomagnesemia 12/23/2016  . Encephalopathy acute 12/23/2016  . SIRS (systemic inflammatory response syndrome) ([place]) 12/23/2016  . Acute respiratory failure ([place]) 12/23/2016  . Pulmonary infiltrates   . Perirectal abscess 12/21/2016  . Hyponatremia 12/21/2016  . Normocytic anemia 12/21/2016  . Elevated LFTs 12/21/2016  . AIDS (acquired immune deficiency syndrome) ([place]) 07/07/2016  . Chronic pain 06/20/2011    Palliative Care Assessment & Plan   HPI: 31 y.o. 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 He was recently here 1/74-0/8 with a complicated course due to disseminated cryptococcal infection and cryptococcal meningoencephalitis. He now presents from SNF with worsening encephalopathy and decreased oral intake. He was admitted on 02/15/2017. On admission he was was found to be septic in the setting of  recurrent cryptococcal meningitis; ID following. He also had increased ICP for which he is having serial LPs to monitor (VP shunt to be considered if ICPs climb again); Neurology consulted and advised. 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 Palliative consulted to assist family in clarifying goals of care given the patient's recurrent health issues and now significant failure to thrive.   Assessment: [name] obtained guardianship, with papers reviewed by my partner NP Mahan on 8/3. She has subsequently changed Luiscarlos's code status to DNR.   I have met with [name] multiple times over the past few weeks. She has voiced her strong concern that Baruc would not want to  live in this debilitated state and, in fact, has been talking with her about death and dying prior to his most recent hospitalizations. That said, she had emotionally prepared herself for transitioning to comfort focused care in the event he declined again, however she was struggling with how to set limits on the time given for improvement and the aggressiveness of care. At the recommendation of ID she did pursue Cortrak placement for supplemental feeding. PEG was discussed, however the risk for infection and poor healing was substantial and it was not recommended.   Unfortunately, the placement of the Cortrak has negated all discharge options for Jeneen Rinks. [name] has also thought more on the PEG and feels like the risk is too great and she cannot put him through another wound or infection. After thinking more on it, [name] feels like it has gotten to the point where aggressive care has not made a difference (even with additional nutrition he remains encephalopathic and entirely dependent on ADLs). The lack of of discharge options is also relevant because she does not want him to spend all of his time in the hospital. She is open to considering Hospice. I  spoke with her about the possibility of residential hospice, and clarified that some of his mediations may be stopped as the the focus of care shifts towards comfort. This may include his antibiotic and/or ARVs. She was very concerned that his father would not agree with that. She would like Hospice to evaluate Omare' medication and clarify what would continue and what would stop. It's important to note that while [name] has guardianship, she seeks consensus from Jayvin' father on medical decisions.    Recommendations/Plan:  Consult for residential hospice placed. I have already called Harmon Pier with [facility] and updated her on the clinical and social situation. I also asked that her team reviews his medication and provide clarity on what will and will not be continued at [facility]. Harmon Pier will reach out to wife with this information and discuss it with her and pt's father.   I spoke with CM and SW and asked to clarify if skilled nursing is still an option if Cortrak is removed, but with the addition of his ARVs and antibiotics. [name] was under the impression that his days ran out. Per SW, Hospice at SNF is not an option so he would need to be going under skilled days.   At this point Hospice needs to clarify what will continue (and what will be stopped) at [facility]. Pt's mother and father will then discuss if this aligns with what they want. If so, plan for discharge to residential hospice. If not, back up would be skilled nursing with palliative (no option for SNF with Hospice, per SW). SW asked to clarify if skilled nursing days are still available. Continue current level of care until this is clarified.   Goals of Care and Additional Recommendations: Limitations on Scope of Treatment: Full Scope Treatment  Code Status:  DNR  Prognosis:   Unable to determine. Very poor prognosis overall.   Discharge Planning:  To Be Determined  Care plan was discussed with pt's mother, CM, SW, Hospice liaison.    Thank you for allowing the Palliative Medicine Team to assist in the care of this patient.  Total time: 35 minutes    Greater than 50%  of this time was spent counseling and coordinating care related to the above assessment and plan.  Charlynn Court, NP Palliative Medicine Team [phone] pager (7a-5p) Team Phone #  [phone]

## 2017-03-14 NOTE — Progress Notes (Signed)
2:30pm: CSW confirmed that patient will be evaluated by [facility] to see what meds patient will be allowed to be on at hospice. [facility] will have to re-evaluate if they can take patient if his NG tube is removed. Patient would still have expensive medicines. CSW to continue to coordinate care.   12pm: CSW and RNCM spoke with patient's mother while father was on the phone. They were both tearful and did not want to "make the decision to let him go" but agreed they did not want patient to suffer and would pursue hospice.   Percell Locus Melaine Mcphee LCSWA [phone]

## 2017-03-15 LAB — GLUCOSE, CAPILLARY
GLUCOSE-CAPILLARY: 93 mg/dL (ref 65–99)
GLUCOSE-CAPILLARY: 80 mg/dL (ref 65–99)
Glucose-Capillary: 104 mg/dL — ABNORMAL HIGH (ref 65–99)
Glucose-Capillary: 108 mg/dL — ABNORMAL HIGH (ref 65–99)
Glucose-Capillary: 113 mg/dL — ABNORMAL HIGH (ref 65–99)
Glucose-Capillary: 89 mg/dL (ref 65–99)
Glucose-Capillary: 97 mg/dL (ref 65–99)

## 2017-03-15 LAB — BASIC METABOLIC PANEL
Anion gap: 7 (ref 5–15)
BUN: 19 mg/dL (ref 6–20)
CALCIUM: 8.8 mg/dL — AB (ref 8.9–10.3)
CHLORIDE: 102 mmol/L (ref 101–111)
CO2: 21 mmol/L — ABNORMAL LOW (ref 22–32)
CREATININE: 0.61 mg/dL (ref 0.61–1.24)
Glucose, Bld: 100 mg/dL — ABNORMAL HIGH (ref 65–99)
Potassium: 4.4 mmol/L (ref 3.5–5.1)
SODIUM: 130 mmol/L — AB (ref 135–145)

## 2017-03-15 LAB — CBC
HCT: 29.1 % — ABNORMAL LOW (ref 39.0–52.0)
HEMOGLOBIN: 9.5 g/dL — AB (ref 13.0–17.0)
MCH: 27.7 pg (ref 26.0–34.0)
MCHC: 32.6 g/dL (ref 30.0–36.0)
MCV: 84.8 fL (ref 78.0–100.0)
PLATELETS: 189 10*3/uL (ref 150–400)
RBC: 3.43 MIL/uL — ABNORMAL LOW (ref 4.22–5.81)
RDW: 15.1 % (ref 11.5–15.5)
WBC: 4.8 10*3/uL (ref 4.0–10.5)

## 2017-03-15 NOTE — Progress Notes (Addendum)
Daily Progress Note   Patient Name: William Pena       Date: 03/15/2017 DOB: [birth year]  Age: 31 y.o. MRN#: [id number] Attending Physician: Rosita Fire, MD Primary Care Physician: [name], MD Admit Date: 02/15/2017  Reason for Consultation/Follow-up: Establishing goals of care, Interfamily conflict, Inpatient hospice referral and Psychosocial/spiritual support  Subjective: William Pena remains unchanged from my prior visits. Cortrak feedings continue. William Pena is very tearful and very sad about the lack of progress, see Assessment section below for a synopsis of our conversation.   Length of Stay: 28  Current Medications: Scheduled Meds:  . amantadine  100 mg Per Tube BID WC  . azithromycin  1,200 mg Oral Weekly  . dolutegravir  50 mg Oral Daily  . emtricitabine-tenofovir AF  1 tablet Oral Daily  . feeding supplement (ENSURE ENLIVE)  237 mL Oral BID BM  . feeding supplement (PRO-STAT SUGAR FREE 64)  30 mL Per Tube TID  . fluconazole  400 mg Per Tube Q1400  . lidocaine (PF)  5 mL Intradermal Once  . multivitamin  15 mL Per Tube Daily  . nystatin  5 mL Per Tube QID  . polyvinyl alcohol  1 drop Both Eyes QID  . sulfamethoxazole-trimethoprim  1 tablet Per Tube Daily  . tamsulosin  0.4 mg Oral Daily    Continuous Infusions: . 0.9 % NaCl with KCl 40 mEq / L 50 mL/hr (03/15/17 0007)  . feeding supplement (JEVITY 1.2 CAL) 1,000 mL (03/14/17 1742)    PRN Meds: acetaminophen **OR** acetaminophen, HYDROcodone-acetaminophen, labetalol, polyethylene glycol, polyvinyl alcohol  Physical Exam   Constitution: Frail man lying in bed. HENT:  Head:  Mucous membranes moist. Right sided facial weakness. Nose: Nose normal.  Cortrak present Eyes:  Right pupil slightly larger than left. Will track me intermittently but seems to be  staring through me rather than looking at me.  Neck: Normal range of motion. Neck supple.  Pulmonary/Chest: Effort normal. Breathing comfortably in bed, no signs of distress.  Musculoskeletal:  Left arm with non-purposeful movement. Right arm contracted. No movement noted in BLE. Significant muscle wasting.  Neurological:  Pt will rouse to verbal stimulation but quickly falls back asleep. Not speaking or following commands. Not blinking on command consistently. He does blink to threat. Skin: Skin is warm and dry.  Extensive tatoos  Psychiatric:  Calm in bed, otherwise non-interactive         Vital Signs: BP 109/77 (BP Location: Right Arm)   Pulse (!) 113   Temp 98.6 F (37 C) (Oral)   Resp 17   Ht '5\' 6"'  (1.676 m)   Wt 37.2 kg (82 lb)   SpO2 100%   BMI 13.24 kg/m  SpO2: SpO2: 100 % O2 Device: O2 Device: Not Delivered O2 Flow Rate: O2 Flow Rate (L/min): 2 L/min  Intake/output summary:   Intake/Output Summary (Last 24 hours) at 03/15/17 1112 Last data filed at 03/15/17 0855  Gross per 24 hour  Intake              240 ml  Output             1200 ml  Net             -  960 ml   LBM: Last BM Date: 03/12/17 Baseline Weight: Weight: 57.1 kg (125 lb 12.8 oz) Most recent weight: Weight: 37.2 kg (82 lb)  Palliative Assessment/Data: PPS 30%   Flowsheet Rows     Most Recent Value  Intake Tab  Referral Department  Hospitalist  Unit at Time of Referral  ICU  Palliative Care Primary Diagnosis  Sepsis/Infectious Disease  Date Notified  02/21/17  Palliative Care Type  New Palliative care  Reason for referral  Clarify Goals of Care  Date of Admission  02/15/17  # of days IP prior to Palliative referral  6  Clinical Assessment  Psychosocial & Spiritual Assessment  Palliative Care Outcomes      Patient Active Problem List   Diagnosis Date Noted  . Encephalopathy   . Advance care planning   . Acute renal failure ([place])   . Hypokalemia 02/23/2017  . Encephalitis   .  Leukopenia 02/22/2017  . Deep tissue injury bilateral heels 02/22/2017  . Wound of buttock 02/22/2017  . Goals of care, counseling/discussion   . Palliative care by specialist   . Sepsis ([place]) 02/16/2017  . Acute encephalopathy 02/15/2017  . Acute urinary retention 02/08/2017  . Genital herpes 02/08/2017  . Anemia of chronic disease 02/08/2017  . Constipation 02/08/2017  . Protein-calorie malnutrition, severe 01/28/2017  . Pressure injury of skin 01/16/2017  . AKI (acute kidney injury) ([place])   . HIV (human immunodeficiency virus infection) ([place])   . Meningitis   . 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 Acute blood loss anemia   . Elevated intracranial pressure   . HIV disease ([place])   . Lymphadenopathy of head and neck   . Rectal abscess   . Meningoencephalitis   . 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 Cavitary pneumonia   . Diffuse lymphadenopathy   . Drug rash   . Hypomagnesemia 12/23/2016  . Encephalopathy acute 12/23/2016  . SIRS (systemic inflammatory response syndrome) ([place]) 12/23/2016  . Acute respiratory failure ([place]) 12/23/2016  . Pulmonary infiltrates   . Perirectal abscess 12/21/2016  . Hyponatremia 12/21/2016  . Normocytic anemia 12/21/2016  . Elevated LFTs 12/21/2016  . AIDS (acquired immune deficiency syndrome) ([place]) 07/07/2016  . Chronic pain 06/20/2011    Palliative Care Assessment & Plan   HPI: 31 y.o. 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 He was recently here 9/47-6/5 with a complicated course due to disseminated cryptococcal infection and cryptococcal meningoencephalitis. He now presents from SNF with worsening encephalopathy and decreased oral intake. He was admitted on 02/15/2017. On admission he was was found to be septic in the setting of  recurrent cryptococcal meningitis; ID following. He also had increased ICP for which he is having serial LPs to monitor (VP shunt to be considered if ICPs climb again); Neurology consulted and advised. 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 Palliative consulted to assist family in clarifying goals of care given the patient's recurrent health issues and now significant failure to thrive.   Assessment: William Pena obtained guardianship, with papers reviewed by my partner NP Mahan on 8/3. She has subsequently changed Dennard's code status to DNR.   I have met with William Pena multiple times over the past few weeks. She has voiced her strong concern that Raydel would not want to  live in this debilitated state and, in fact, has been talking with her about death and dying prior to his most recent hospitalizations. That said, she had emotionally prepared herself for transitioning to comfort focused care in the event he declined again, however she was struggling with how to set limits on the time given for improvement and the aggressiveness of care. At the recommendation of ID she did pursue Cortrak placement for supplemental feeding. PEG was discussed, however the risk for infection and poor healing was substantial and it was not recommended.   Unfortunately, the placement of the Cortrak has negated all discharge options for Jeneen Rinks. William Pena has also thought more on the PEG and feels like the risk is too great and she cannot put him through another wound or infection. After thinking more on it, William Pena feels like it has gotten to the point where aggressive care has not made a difference (even with additional nutrition he remains encephalopathic and entirely dependent on ADLs). The lack of of discharge options is also relevant because she does not want him to spend all of his time in the hospital and is not able to take care of him at  home. I met with her yesterday, with the patient's father on the phone, and talked through the option of Hospice. At that point there was strong concern about what medications would be stopped (Joe was concerned about stopping the ARVs and antibiotics), and they wanted clarity from Hospice on that. Initially [facility] was chosen, however William Pena then felt [facility] would be better as it was closer to where she lived.   Today, I followed up with SW to clarify [name]'s preference for [facility]. I also asked for the liaison to reach out to [place] and clarify what medication would be stopped and what would be continued. William Pena is comfortable with stopping the abx and ARVs, and Joe will agree if that is the only way for him to qualify for Hospice support. Joe would ideally like them to be continued, however.    Recommendations/Plan:  Consult for residential hospice placed. SW updated on family preference for [facility], and liaison asked to discuss care plan with family William Pena will be agreeable to Hospice decision on what to stop/continue, she just wants to know the plan and have it explained to the patient's father).   I spoke with CM and SW and asked to clarify if skilled nursing is still an option if Cortrak is removed, but with the addition of his ARVs and antibiotics. William Pena was under the impression that his days ran out. Per SW, Hospice at SNF is not an option so he would need to be going under skilled days. ((This is Plan B if for whatever reason Hospice is not an option))  ADDENDUM 1430: [facility] do not feel he meets residential criteria due to lack of symptom management needs and some PO intake. They recommended transitioning him to full comfort here and following to see if symptoms present that would qualify him for residential need. I did speak with William Pena about this possibility. She is concerned because part of the reason she was pursuing Hospice was to enable  him time outside of the hospital. I shared the real potential that, once transitioned to full comfort measures, he may remain stable for a period then have an acute decline that makes him too unstable to leave the hospital. She will think on it further and I will follow up  with her tomorrow.   Goals of Care and Additional Recommendations: Limitations on Scope of Treatment: Full Scope Treatment; plan to likely transition to comfort focused care   Code Status:  DNR  Prognosis:   Unable to determine. Very poor prognosis overall.   Discharge Planning:  To Be Determined  Care plan was discussed with pt's mother, SW  Thank you for allowing the Palliative Medicine Team to assist in the care of this patient.  Total time: 35 minutes    Greater than 50%  of this time was spent counseling and coordinating care related to the above assessment and plan.  Charlynn Court, NP Palliative Medicine Team [phone] pager (7a-5p) Team Phone # [phone]

## 2017-03-15 NOTE — Progress Notes (Addendum)
4:20pm CSW received call back from [facility] who has been meeting with family to discuss possible admission.   [facility] states pt does not meet criteria due to lack of symptom management needs and that patient still has PO intake.  They inquired if pt could be taken off of medications here in the hospital to see if they might develop symptoms that need management.  CSW relayed this information to Palliative NP- she will follow up with mother to see if comfort measures can be started here.  CSW reached back out to [facility] to see if they can look at patient and think they meet criteria for admission to their residential program.   10:20am CSW contacted by palliative care- family now interested in [facility] and wanting to know what would be required to be discontinued to come to hospice- [facility] is reviewing and will reach out to patient mom to discuss.  CSW will continue to follow  Jorge Ny, LCSW Clinical Social Worker [phone]

## 2017-03-15 NOTE — Progress Notes (Signed)
Nutrition Follow-up  DOCUMENTATION CODES:   Underweight, Severe malnutrition in context of chronic illness  INTERVENTION:   -Continue Jevity 1.5@ 47m/hr via cortrak tube  319mProstat TID.   Tube feeding regimen provides 1560kcal (100% of needs), 99grams of protein, and 63369mf H2O.   -Continue liquid MVI daily  -Continue Ensure Enlive po BID, each supplement provides 350 kcal and 20 grams of protein  NUTRITION DIAGNOSIS:   Malnutrition (severe) related to chronic illness (AIDS) as evidenced by severe depletion of body fat, severe depletion of muscle mass, percent weight loss.  Ongoing  GOAL:   Patient will meet greater than or equal to 90% of their needs  Met with TF  MONITOR:   PO intake, Supplement acceptance, Labs, Weight trends, Skin, I & O's  REASON FOR ASSESSMENT:   Consult Enteral/tube feeding initiation and management  ASSESSMENT:   [age] Male with PMH of AIDS, recent admission for perirectal abscess, development of cryptococcal meningoencephalitis, recent CVA,  ARF, severe protein calorie malnutrition who presents to the ED from facility with increased mental status change. Patient was in usual state of health. On recent discharge (dated 7/9), patient was recommended by ID to continue fluconazole x 4 more weeks before resuming antiviral meds for HIV. Patient is non-verbal at baseline. Patient's mother reported patient's poor PO intake recently.  8/8- s/p cortrak tube placement; tip of tube in stomach 8/9- TF initiated  Pt remains with cortrak tube, infusing Jevity 1.5@ 54m49m and 30ml100mstat TID. Complete regimen provides 1560kcal (100% of needs), 99grams of protein, and 633ml 29m2O.   Oral intake remains minimal. Meal completion 0-15%. Observed breakfast tray on tray table; pt consumed 100% bowl of grits and a few sips of Ensure this AM.   Palliative care team continues to follow closely; per most recent note, pt mother is strongly  considering comfort care, but would like to discuss with hospice liason regarding which therapies would be discontinued if hospice route was pursued.   Labs reviewed: CBGS: 89-108.   Diet Order:  Diet regular Room service appropriate? Yes; Fluid consistency: Thin  Skin:  Wound (see comment) (st 3 buttocks, DTI rt heel, st 3 lt heel, st 2 lt ankle)  Last BM:  03/15/17  Height:   Ht Readings from Last 1 Encounters:  02/17/17 _0  (1.676 m)    Weight:   Wt Readings from Last 1 Encounters:  03/15/17 82 lb (37.2 kg)    Ideal Body Weight:  75.4 kg  BMI:  Body mass index is 13.24 kg/m.  Estimated Nutritional Needs:   Kcal:  1400-1600  Protein:  85-100 grams  Fluid:  > 1.4 L  EDUCATION NEEDS:   No education needs identified at this time  Roselee Tayloe A. WilliaJimmye NormanLDN, CDE Pager: [phone] hours Pager: [phone]

## 2017-03-15 NOTE — Progress Notes (Signed)
PROGRESS NOTE    William Pena  IOE:[id number] DOB: 13-Mar-1986 DOA: 02/15/2017 PCP: [name], MD   Brief Narrative: 31 y.o.malewith a PMH of AIDS (not on antiviral therapy), cryptococcal meningeal encephalitis, recent CVA, severe protein calorie malnutrition and recent hospitalization (for 13months) for treatment of perirectal abscess who was admitted 02/15/17 for evaluation of altered mental status. Initial impression from ID was that he had relapsed cryptococcal meningitis. He was started on diflucan, s/p serial LP. Also started on HIV meds,  ID has since signed off. Not a candidate for peg placement per IR.   Hospice vs snf placement evaluation ongoing  Assessment & Plan:   # Sepsis ([name] to cryptococcal meningeal encephalitis  -Will need 8 weeks of diflucan 400mg  therapy per ID (started 02/16/17 with stopping date 04/13/17), and will likely need 200 mg of oral fluconazole therapy for another 10 months to 12 months of treatment. Per last ID notes on 8/6, ok to discharge on oral diflucan 400mg  daily -no clinical improvement. Hospice evaluation ongoing.  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  - On ARV as per ID - Patient is continued on Bactrim daily and azithromycin weekly to prevent opportunistic infections. - IR consulted and declined PEG placement due to risk over weight benefit in current condition, please see IR note on 8/8.   #Encephalopathy acute :likely due to cryptococcal meningeal encephalitis vs anoxic brain injury.  - No change in mental status. - Patient continues to be non-verbal and with staring spells, he had left sided gaze, seems to have right sided neglect, unchanged  #AKI (acute kidney injury) ([place]) -improved  # Hypokalemia/hypomag/Hyponatremia - improved. Check labs  intermittently  #Protein-calorie malnutrition, severe - Initially discussed with GI who recommended IR consultation. 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   # full thickness wounds to bilat buttocks; 2X.5X.5cm, 1X1X1cm, 5.5X3X.2cm - Continue wound care per WOC. Deep tissue injuries bilateral heels  DVT prophylaxis: SCD Code Status:DNR Family Communication:no family member at bedside Disposition Plan:likely discharge to the residential hospice when bed is available.  Consultants:   ID (signed off)  Neurology (signed off)  Hematology Dr Marin Olp (signed off)  Palliative Care  IR recommended against peg tube placement   Nutrition  Social worker  Speech/PT/OT  Procedures: LPs Antimicrobials: ARV, fluconazole, bactrim, azithromycin  Subjective: Seen and examined at bedside. Patient was not responsive with fixed left gaze. Review of system limited.  Objective: Vitals:   03/14/17 1421 03/14/17 2053 03/15/17 0320 03/15/17 0441  BP: 117/76 119/73  109/77  Pulse: 86 (!) 105  (!) 113  Resp: 18 16  17   Temp: 100.2 F (37.9 C) 98.7 F (37.1 C)  98.6 F (37 C)  TempSrc: Oral Oral  Oral  SpO2: 99% 99%  100%  Weight:   37.2 kg (82 lb)   Height:        Intake/Output Summary (Last 24 hours) at 03/15/17 1427 Last data filed at 03/15/17 1100  Gross per 24 hour  Intake              195 ml  Output  1200 ml  Net            -1005 ml   Filed Weights   03/13/17 0419 03/14/17 0300 03/15/17 0320  Weight: 40.8 kg (90 lb) 35.8 kg (79 lb) 37.2 kg (82 lb)    Examination:  General exam: not responsive male with fixed gaze on the left side. Nasal feeding  tube in place Respiratory system: Clear to auscultation. Respiratory effort normal.  Cardiovascular system: S1 & S2 heard, RRR.  No pedal edema. Gastrointestinal system: Abdomen is nondistended, soft. Normal bowel sounds heard. Central nervous system: not responsive      Data Reviewed: I have personally reviewed following labs and imaging studies  CBC:  Recent Labs Lab 03/11/17 1825  WBC 4.4  HGB 10.2*  HCT 30.9*  MCV 85.1  PLT 295   Basic Metabolic Panel:  Recent Labs Lab 03/09/17 0445 03/10/17 0332 03/11/17 0402 03/11/17 1825 03/13/17 0316  NA 133* 135 136 132* 133*  K 4.9 3.9 4.4 4.4 4.3  CL 107 107 108 105 105  CO2 16* 20* 20* 21* 22  GLUCOSE 72 93 104* 97 92  BUN 12 15 14 11 13   CREATININE 0.53* 0.44* 0.50* 0.49* 0.39*  CALCIUM 8.8* 8.6* 8.6* 8.6* 8.9  MG 1.5* 1.6* 1.7  --  1.5*  PHOS 4.2 3.8  --   --  3.9   GFR: Estimated Creatinine Clearance: 70.4 mL/min (A) (by C-G formula based on SCr of 0.39 mg/dL (L)). Liver Function Tests: No results for input(s): AST, ALT, ALKPHOS, BILITOT, PROT, ALBUMIN in the last 168 hours. No results for input(s): LIPASE, AMYLASE in the last 168 hours. No results for input(s): AMMONIA in the last 168 hours. Coagulation Profile: No results for input(s): INR, PROTIME in the last 168 hours. Cardiac Enzymes: No results for input(s): CKTOTAL, CKMB, CKMBINDEX, TROPONINI in the last 168 hours. BNP (last 3 results) No results for input(s): PROBNP in the last 8760 hours. HbA1C: No results for input(s): HGBA1C in the last 72 hours. CBG:  Recent Labs Lab 03/14/17 1957 03/15/17 0008 03/15/17 0439 03/15/17 0739 03/15/17 1159  GLUCAP 89 108* 93 97 89   Lipid Profile: No results for input(s): CHOL, HDL, LDLCALC, TRIG, CHOLHDL, LDLDIRECT in the last 72 hours. Thyroid Function Tests: No results for input(s): TSH, T4TOTAL, FREET4, T3FREE, THYROIDAB in the last 72 hours. Anemia Panel: No results for input(s): VITAMINB12, FOLATE,  FERRITIN, TIBC, IRON, RETICCTPCT in the last 72 hours. Sepsis Labs: No results for input(s): PROCALCITON, LATICACIDVEN in the last 168 hours.  No results found for this or any previous visit (from the past 240 hour(s)).       Radiology Studies: No results found.      Scheduled Meds: . amantadine  100 mg Per Tube BID WC  . azithromycin  1,200 mg Oral Weekly  . dolutegravir  50 mg Oral Daily  . emtricitabine-tenofovir AF  1 tablet Oral Daily  . feeding supplement (ENSURE ENLIVE)  237 mL Oral BID BM  . feeding supplement (PRO-STAT SUGAR FREE 64)  30 mL Per Tube TID  . fluconazole  400 mg Per Tube Q1400  . lidocaine (PF)  5 mL Intradermal Once  . multivitamin  15 mL Per Tube Daily  . nystatin  5 mL Per Tube QID  . polyvinyl alcohol  1 drop Both Eyes QID  . sulfamethoxazole-trimethoprim  1 tablet Per Tube Daily  . tamsulosin  0.4 mg Oral Daily   Continuous Infusions: . 0.9 % NaCl with  KCl 40 mEq / L 50 mL/hr (03/15/17 0007)  . feeding supplement (JEVITY 1.2 CAL) 1,000 mL (03/14/17 1742)     LOS: 28 days    Ladora Osterberg Tanna Furry, MD [facility] Pager [phone]  If 7PM-7AM, please contact night-coverage [email or web address] Password [facility] 03/15/2017, 2:27 PM

## 2017-03-16 LAB — GLUCOSE, CAPILLARY
GLUCOSE-CAPILLARY: 90 mg/dL (ref 65–99)
Glucose-Capillary: 91 mg/dL (ref 65–99)
Glucose-Capillary: 101 mg/dL — ABNORMAL HIGH (ref 65–99)
Glucose-Capillary: 96 mg/dL (ref 65–99)

## 2017-03-16 MED ORDER — POLYETHYLENE GLYCOL 3350 17 G PO PACK: 17.0000 g | PACK | Freq: Every day | ORAL | Status: DC | PRN

## 2017-03-16 MED ORDER — MORPHINE SULFATE (PF) 2 MG/ML IV SOLN: 1.0000 mg | INTRAVENOUS | Status: DC | PRN

## 2017-03-16 MED ORDER — LORAZEPAM 2 MG/ML IJ SOLN: 0.5000 mg | INTRAMUSCULAR | Status: DC | PRN

## 2017-03-16 MED ORDER — ONDANSETRON HCL 4 MG/2ML IJ SOLN: 4.0000 mg | Freq: Four times a day (QID) | INTRAMUSCULAR | Status: DC | PRN

## 2017-03-16 MED ORDER — OXYCODONE HCL 5 MG PO TABS: 5.0000 mg | ORAL_TABLET | ORAL | Status: DC | PRN

## 2017-03-16 MED ORDER — NYSTATIN 100000 UNIT/ML MT SUSP
5.0000 mL | Freq: Four times a day (QID) | OROMUCOSAL | Status: DC
  Administered 2017-03-16 – 2017-03-17 (×5): 500000 [IU] via ORAL
  Filled 2017-03-16 (×6): qty 5

## 2017-03-16 NOTE — Progress Notes (Addendum)
PROGRESS NOTE    William Pena  PJA:[id number] DOB: September 11, 1985 DOA: 02/15/2017 PCP: [name], MD   Brief Narrative: 31 y.o.malewith a PMH of AIDS (not on antiviral therapy), cryptococcal meningeal encephalitis, recent CVA, severe protein calorie malnutrition and recent hospitalization (for 9months) for treatment of perirectal abscess who was admitted 02/15/17 for evaluation of altered mental status. Initial impression from ID was that he had relapsed cryptococcal meningitis. He was started on diflucan, s/p serial LP. Also started on HIV meds,  ID has since signed off. Not a candidate for peg placement per IR.   Palliative care and SW following for possible hospice discharge. As per palliative care note from yesterday, discussion ongoing with patient's guardian regarding possible comfort care. No significant clinical improvement with the patient and I think hospice care would be appropriate at this time. I discussed with the social worker and care management team.  Assessment & Plan:   # Sepsis ([name] to cryptococcal meningeal encephalitis  -Will need 8 weeks of diflucan 400mg  therapy per ID (started 02/16/17 with stopping date 04/13/17), and will likely need 200 mg of oral fluconazole therapy for another 10 months to 12 months of treatment. Per last ID notes on 8/6, ok to discharge on oral diflucan 400mg  daily -no clinical improvement. Hospice evaluation ongoing.  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  - On ARV as per ID - Patient is continued on Bactrim daily and azithromycin weekly to prevent opportunistic infections. - IR consulted and declined PEG placement due to risk over weight benefit in current condition, please see IR note on 8/8.   #Encephalopathy acute :likely due to cryptococcal meningeal encephalitis vs  anoxic brain injury.  - No change in mental status. - Patient continues to be non-verbal and with staring spells, he had left sided gaze, seems to have right sided neglect, unchanged  #AKI (acute kidney injury) ([place]) -improved  # Hypokalemia/hypomag/Hyponatremia - improved. Check labs intermittently  #Protein-calorie malnutrition, severe - Initially discussed with GI who recommended IR consultation. 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   # full thickness wounds to bilat buttocks; 2X.5X.5cm, 1X1X1cm, 5.5X3X.2cm - Continue wound care per WOC. Deep tissue injuries bilateral heels  DVT prophylaxis: SCD Code Status:DNR Family Communication:no family member at bedside Disposition Plan:likely discharge to the residential hospice when bed is available.  Consultants:   ID (signed off)  Neurology (signed off)  Hematology Dr Marin Olp (signed off)  Palliative Care  IR recommended against peg tube placement   Nutrition  Social worker  Speech/PT/OT  Procedures: LPs Antimicrobials: ARV, fluconazole, bactrim, azithromycin  Subjective: Seen and examined at bedside. Patient was not responsive with fixed left gaze. Review of system limited. Unchanged today.  Objective: Vitals:   03/15/17 0441 03/15/17 1441 03/15/17 2116 03/16/17 0416  BP: 109/77 117/75 112/70 112/63  Pulse: (!) 113 (!) 103 94 92  Resp: 17 (!) 22 16 16   Temp: 98.6 F (37 C) 99.7 F (37.6 C) 98.8 F (37.1 C) 98.3 F (36.8 C)  TempSrc: Oral Oral Oral Oral  SpO2: 100% 100% 100% 99%  Weight:      Height:  Intake/Output Summary (Last 24 hours) at 03/16/17 1149 Last data filed at  03/16/17 1000  Gross per 24 hour  Intake              100 ml  Output             2950 ml  Net            -2850 ml   Filed Weights   03/13/17 0419 03/14/17 0300 03/15/17 0320  Weight: 40.8 kg (90 lb) 35.8 kg (79 lb) 37.2 kg (82 lb)    Examination:No change in physical exam from yesterday.  General exam: not responsive male with fixed gaze on the left side. Nasal feeding tube in place. Respiratory system: Clear to auscultation. Respiratory effort normal.  Cardiovascular system: S1 & S2 heard, RRR.  No pedal edema. Gastrointestinal system: Abdomen is nondistended, soft. Normal bowel sounds heard. Central nervous system: not responsive      Data Reviewed: I have personally reviewed following labs and imaging studies  CBC:  Recent Labs Lab 03/11/17 1825 03/15/17 1904  WBC 4.4 4.8  HGB 10.2* 9.5*  HCT 30.9* 29.1*  MCV 85.1 84.8  PLT 163 161   Basic Metabolic Panel:  Recent Labs Lab 03/10/17 0332 03/11/17 0402 03/11/17 1825 03/13/17 0316 03/15/17 1904  NA 135 136 132* 133* 130*  K 3.9 4.4 4.4 4.3 4.4  CL 107 108 105 105 102  CO2 20* 20* 21* 22 21*  GLUCOSE 93 104* 97 92 100*  BUN 15 14 11 13 19   CREATININE 0.44* 0.50* 0.49* 0.39* 0.61  CALCIUM 8.6* 8.6* 8.6* 8.9 8.8*  MG 1.6* 1.7  --  1.5*  --   PHOS 3.8  --   --  3.9  --    GFR: Estimated Creatinine Clearance: 70.4 mL/min (by C-G formula based on SCr of 0.61 mg/dL). Liver Function Tests: No results for input(s): AST, ALT, ALKPHOS, BILITOT, PROT, ALBUMIN in the last 168 hours. No results for input(s): LIPASE, AMYLASE in the last 168 hours. No results for input(s): AMMONIA in the last 168 hours. Coagulation Profile: No results for input(s): INR, PROTIME in the last 168 hours. Cardiac Enzymes: No results for input(s): CKTOTAL, CKMB, CKMBINDEX, TROPONINI in the last 168 hours. BNP (last 3 results) No results for input(s): PROBNP in the last 8760 hours. HbA1C: No results for input(s): HGBA1C in the last 72  hours. CBG:  Recent Labs Lab 03/15/17 1617 03/15/17 2117 03/15/17 2359 03/16/17 0413 03/16/17 0759  GLUCAP 80 113* 104* 91 101*   Lipid Profile: No results for input(s): CHOL, HDL, LDLCALC, TRIG, CHOLHDL, LDLDIRECT in the last 72 hours. Thyroid Function Tests: No results for input(s): TSH, T4TOTAL, FREET4, T3FREE, THYROIDAB in the last 72 hours. Anemia Panel: No results for input(s): VITAMINB12, FOLATE, FERRITIN, TIBC, IRON, RETICCTPCT in the last 72 hours. Sepsis Labs: No results for input(s): PROCALCITON, LATICACIDVEN in the last 168 hours.  No results found for this or any previous visit (from the past 240 hour(s)).       Radiology Studies: No results found.      Scheduled Meds: . amantadine  100 mg Per Tube BID WC  . azithromycin  1,200 mg Oral Weekly  . dolutegravir  50 mg Oral Daily  . emtricitabine-tenofovir AF  1 tablet Oral Daily  . feeding supplement (ENSURE ENLIVE)  237 mL Oral BID BM  . feeding supplement (PRO-STAT SUGAR FREE 64)  30 mL Per Tube TID  . fluconazole  400 mg Per  Tube Q1400  . lidocaine (PF)  5 mL Intradermal Once  . multivitamin  15 mL Per Tube Daily  . nystatin  5 mL Per Tube QID  . polyvinyl alcohol  1 drop Both Eyes QID  . sulfamethoxazole-trimethoprim  1 tablet Per Tube Daily  . tamsulosin  0.4 mg Oral Daily   Continuous Infusions: . 0.9 % NaCl with KCl 40 mEq / L 50 mL/hr (03/15/17 2043)  . feeding supplement (JEVITY 1.2 CAL) 1,000 mL (03/15/17 1819)     LOS: 29 days    Amour Trigg Tanna Furry, MD [facility] Pager [phone]  If 7PM-7AM, please contact night-coverage [email or web address] Password [facility] 03/16/2017, 11:49 AM

## 2017-03-16 NOTE — Progress Notes (Signed)
Case reviewed with medical director. Since we are not providing patient with hospital level interventions, patient will need to discharge on Friday. If patient's family wants to keep the NG tube, patient's mother will need to take him home tomorrow. If we remove the NG tube today and stop the ARV's, [employer] can reassess patient tomorrow for bed availability.  William Pena LCSWA [phone]

## 2017-03-16 NOTE — Progress Notes (Signed)
Daily Progress Note   Patient Name: William Pena       Date: 03/16/2017 DOB: 1986-01-04  Age: 31 y.o. MRN#: [id number] Attending Physician: Rosita Fire, MD Primary Care Physician: [name], MD Admit Date: 02/15/2017  Reason for Consultation/Follow-up: Disposition, Establishing goals of care, Inpatient hospice referral and Psychosocial/spiritual support  Subjective: William Pena remains unchanged from my prior visits. After extensive discussions he will now be transitioned to full comfort measures. William Pena is tearful about this but feeling relieved that he will not have to suffer for longer.   Length of Stay: 29  Current Medications: Scheduled Meds:  . feeding supplement (ENSURE ENLIVE)  237 mL Oral BID BM  . nystatin  5 mL Oral QID  . polyvinyl alcohol  1 drop Both Eyes QID  . tamsulosin  0.4 mg Oral Daily    Continuous Infusions:  PRN Meds: acetaminophen **OR** acetaminophen, labetalol, LORazepam, morphine injection **OR** oxyCODONE, ondansetron (ZOFRAN) IV, polyethylene glycol, polyvinyl alcohol  Physical Exam   Constitution: Frail man lying in bed. HENT:  Head:  Mucous membranes moist. Right sided facial weakness. Nose: Nose normal.  Cortrak present Eyes:  Right pupil slightly larger than left. Will track me intermittently but seems to be staring through me rather than looking at me.  Neck: Normal range of motion. Neck supple.  Pulmonary/Chest: Effort normal. Breathing comfortably in bed, no signs of distress.  Musculoskeletal:  Left arm with non-purposeful movement. Right arm contracted. No movement noted in BLE. Significant muscle wasting.  Neurological:  Pt will rouse to verbal stimulation but quickly falls back asleep. Not speaking or following commands. Not blinking on command consistently. He does blink to  threat. Skin: Skin is warm and dry.  Extensive tatoos  Psychiatric:  Calm in bed, otherwise non-interactive         Vital Signs: BP 112/63 (BP Location: Right Arm)   Pulse 92   Temp 98.3 F (36.8 C) (Oral)   Resp 16   Ht _0  (1.676 m)   Wt 37.2 kg (82 lb)   SpO2 99%   BMI 13.24 kg/m  SpO2: SpO2: 99 % O2 Device: O2 Device: Not Delivered O2 Flow Rate: O2 Flow Rate (L/min): 2 L/min  Intake/output summary:   Intake/Output Summary (Last 24 hours) at 03/16/17 1234 Last data filed at 03/16/17 1000  Gross per 24 hour  Intake              100 ml  Output             2950 ml  Net            -2850 ml   LBM: Last BM Date: 03/14/17 Baseline Weight: Weight: 57.1 kg (125 lb 12.8 oz) Most recent weight: Weight: 37.2 kg (82 lb)  Palliative Assessment/Data: PPS 30%   Flowsheet Rows     Most Recent Value  Intake Tab  Referral Department  Hospitalist  Unit at Time of Referral  ICU  Palliative Care Primary Diagnosis  Sepsis/Infectious Disease  Date Notified  02/21/17  Palliative Care Type  New Palliative care  Reason for referral  Clarify Goals of Care  Date of Admission  02/15/17  # of days IP  prior to Palliative referral  6  Clinical Assessment  Psychosocial & Spiritual Assessment  Palliative Care Outcomes      Patient Active Problem List   Diagnosis Date Noted  . Encephalopathy   . Advance care planning   . Acute renal failure ([place])   . Hypokalemia 02/23/2017  . Encephalitis   . Leukopenia 02/22/2017  . Deep tissue injury bilateral heels 02/22/2017  . Wound of buttock 02/22/2017  . Goals of care, counseling/discussion   . Palliative care by specialist   . Sepsis ([place]) 02/16/2017  . Acute encephalopathy 02/15/2017  . Acute urinary retention 02/08/2017  . Genital herpes 02/08/2017  . Anemia of chronic disease 02/08/2017  . Constipation 02/08/2017  . Protein-calorie malnutrition, severe 01/28/2017  . Pressure injury of skin 01/16/2017  . AKI (acute kidney injury)  ([place])   . HIV (human immunodeficiency virus infection) ([place])   . Meningitis   . 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 Acute blood loss anemia   . Elevated intracranial pressure   . HIV disease ([place])   . Lymphadenopathy of head and neck   . Rectal abscess   . Meningoencephalitis   . 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 Cavitary pneumonia   . Diffuse lymphadenopathy   . Drug rash   . Hypomagnesemia 12/23/2016  . Encephalopathy acute 12/23/2016  . SIRS (systemic inflammatory response syndrome) ([place]) 12/23/2016  . Acute respiratory failure ([place]) 12/23/2016  . Pulmonary infiltrates   . Perirectal abscess 12/21/2016  . Hyponatremia 12/21/2016  . Normocytic anemia 12/21/2016  . Elevated LFTs 12/21/2016  . AIDS (acquired immune deficiency syndrome) ([place]) 07/07/2016  . Chronic pain 06/20/2011    Palliative Care Assessment & Plan   HPI: 31 y.o. 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 He was recently here 7/34-2/8 with a complicated course due to disseminated cryptococcal infection and cryptococcal meningoencephalitis. He now presents from SNF with worsening encephalopathy and decreased oral intake. He was admitted on 02/15/2017. On admission he was was found to be septic in the setting of recurrent cryptococcal meningitis; ID following. He also had increased ICP for which he is having serial LPs to monitor (VP shunt to be considered if ICPs climb again); Neurology consulted and advised. 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 Palliative consulted to assist family in clarifying goals of care given the patient's recurrent health issues and now significant failure to thrive.    Assessment: William Pena obtained guardianship, with papers reviewed by my partner NP Mahan on 8/3. She has subsequently changed William Pena's code status to DNR. After extensive conversations over the past week William Pena is now ready to transition William Pena to full comfort measures. We discussed stopping the tube feeds and removing the Cortrak, stopping the antibiotics and ARVs, and focusing care on symptom management and comfort. While tearful about this decision, she has found peace in the idea that he will not be suffering and will be able to pass peacefully.   In terms of disposition, she would ideally like for him to be out of the hospital. Her preference (in order) is [facility], [facility], then [facility] with Hospice.    Recommendations/Plan:  [place] was transitioned to full comfort measures today. Orders adjusted.  SW working on discharge plan. [facility]  and [facility] to evaluate again tomorrow  Goals of Care and Additional Recommendations: Limitations on Scope of Treatment: Full Comfort Care  Code Status:  DNR  Prognosis:   < 2 weeks in the setting of AIDS in which ARVs have been stopped, cryptococcal meningitis for which abx have been stopped, and poor oral intake.   Discharge Planning:  Pt will need Hospice support. First choice is a residential hospice facility, last resort would be SNF with Hospice.  Care plan was discussed with pt's mother, CM, SW  Thank you for allowing the Palliative Medicine Team to assist in the care of this patient.  Total time: 35 minutes    Greater than 50%  of this time was spent counseling and coordinating care related to the above assessment and plan.  Charlynn Court, NP Palliative Medicine Team [phone] pager (7a-5p) Team Phone # [phone]

## 2017-03-16 NOTE — Consult Note (Signed)
[facility] with our Market researcher and NP with [facility] and since pt has now elected to go comfort they will begin this in hospital. We will meet with pt's mother at 1000 am tomorrow morning in pt room to re-evaluate him for our hospice home. SW aware of the meeting.  Kevin Fenton RN [phone]/ Webb Silversmith RN

## 2017-03-16 NOTE — Progress Notes (Signed)
Nutrition Brief Note  Chart reviewed. Pt now transitioning to comfort care.  No further nutrition interventions warranted at this time.  Please re-consult as needed.   Keyleen Cerrato A. Toan Mort, RD, LDN, CDE Pager: [phone] After hours Pager: [phone]  

## 2017-03-16 NOTE — Progress Notes (Signed)
Ran into pt's mother in hall when coming from seeing another pt. She stopped me, glad to see me, asked did I remember her, I gave her a pink blanket for her son she still has. I did, and, when asked how he was doing, she said he was likely going to hospice tomorrow. Offered a prayer of blessing, and will f/u tomorrow.    03/16/17 1700  Clinical Encounter Type  Visited With Family  Visit Type Follow-up;Psychological support;Spiritual support;Social support  Referral From Chaplain  Spiritual Encounters  Spiritual Needs Emotional;Grief support  Stress Factors  Patient Stress Factors Health changes;Other (Comment)  Family Stress Factors Loss (;going to hospice)     03/16/17 1700  Clinical Encounter Type  Visited With Family  Visit Type Follow-up;Psychological support;Spiritual support;Social support  Referral From Chaplain  Spiritual Encounters  Spiritual Needs Emotional;Grief support  Stress Factors  Patient Stress Factors Health changes;Other (Comment)  Family Stress Factors Loss (;going to hospice)   Gerrit Heck, Chaplain

## 2017-03-16 NOTE — Progress Notes (Signed)
PT Cancellation Note  Patient Details Name: William Pena MRN: [id number] DOB: [birth year]   Cancelled Treatment:    Reason Eval/Treat Not Completed: Medical issues which prohibited therapy (pt to DC to residential hospice, not able to participate in PT, will sign off. )   [name] 03/16/2017, 10:23 AM  [phone]

## 2017-03-16 NOTE — Progress Notes (Signed)
[facility] Liaison:  RN  Received request from [place], [place], for family interest in [facility].  Chart reviewed.  Unfortunately, [facility] is not able to offer a room today.  LCSW are aware and [facility] liaison will follow up with CSW and family tomorrow or sooner if room becomes available.  Please do not hesitate to call with questions.    Thank you for the referral.  Edyth Gunnels, RN, BSN [facility] Liaison [phone]  All hospital liaisons are now on [place].

## 2017-03-17 LAB — GLUCOSE, CAPILLARY
GLUCOSE-CAPILLARY: 79 mg/dL (ref 65–99)
GLUCOSE-CAPILLARY: 157 mg/dL — AB (ref 65–99)

## 2017-03-17 NOTE — Progress Notes (Signed)
Daily Progress Note   Patient Name: William Pena       Date: 03/17/2017 DOB: 02-Jul-1986  Age: 31 y.o. MRN#: [id number] Attending Physician: Rosita Fire, MD Primary Care Physician: [name], MD Admit Date: 02/15/2017  Reason for Consultation/Follow-up: Disposition, Establishing goals of care, Inpatient hospice referral and Psychosocial/spiritual support  Subjective: William Pena remains unchanged from my prior visits. He was transitioned to full comfort measures yesterday and has remained comfortable since. He does continue to have some oral intake, though will only eat and drink consistently when his mother is feeding him.   Length of Stay: 30  Current Medications: Scheduled Meds:  . feeding supplement (ENSURE ENLIVE)  237 mL Oral BID BM  . nystatin  5 mL Oral QID  . polyvinyl alcohol  1 drop Both Eyes QID  . tamsulosin  0.4 mg Oral Daily    Continuous Infusions:  PRN Meds: acetaminophen **OR** acetaminophen, labetalol, LORazepam, morphine injection **OR** oxyCODONE, ondansetron (ZOFRAN) IV, polyethylene glycol, polyvinyl alcohol  Physical Exam   Constitution: Frail man lying in bed. HENT:  Head:  Mucous membranes moist. Right sided facial weakness. Nose: Nose normal.  Eyes:  Right pupil slightly larger than left. Will track me intermittently but seems to be staring through me rather than looking at me.  Neck: Normal range of motion. Neck supple.  Pulmonary/Chest: Effort normal. Breathing comfortably in bed, no signs of distress. Occasional dry cough.  Musculoskeletal:  Left arm with non-purposeful movement. Right arm contracted. No movement noted in BLE. Significant muscle wasting.  Neurological:  Pt will rouse to verbal stimulation but quickly falls back asleep. Not speaking or following commands. Not blinking on  command consistently. He does blink to threat. Skin: Skin is warm and dry.  Extensive tatoos  Psychiatric:  Calm in bed, otherwise non-interactive         Vital Signs: BP 118/80 (BP Location: Right Arm)   Pulse 82   Temp 97.8 F (36.6 C)   Resp 16   Ht '5\' 6"'  (1.676 m)   Wt 37.2 kg (82 lb)   SpO2 100%   BMI 13.24 kg/m  SpO2: SpO2: 100 % O2 Device: O2 Device: Not Delivered O2 Flow Rate: O2 Flow Rate (L/min): 2 L/min  Intake/output summary:   Intake/Output Summary (Last 24 hours) at 03/17/17 1053 Last data filed at 03/17/17 0052  Gross per 24 hour  Intake              200 ml  Output             1750 ml  Net            -1550 ml   LBM: Last BM Date: 03/16/17 Baseline Weight: Weight: 57.1 kg (125 lb 12.8 oz) Most recent weight: Weight: 37.2 kg (82 lb)  Palliative Assessment/Data: PPS 30%   Flowsheet Rows     Most Recent Value  Intake Tab  Referral Department  Hospitalist  Unit at Time of Referral  ICU  Palliative Care Primary Diagnosis  Sepsis/Infectious Disease  Date Notified  02/21/17  Palliative Care Type  New Palliative care  Reason for referral  Clarify Goals of Care  Date of Admission  02/15/17  #  of days IP prior to Palliative referral  6  Clinical Assessment  Psychosocial & Spiritual Assessment  Palliative Care Outcomes      Patient Active Problem List   Diagnosis Date Noted  . Encephalopathy   . Advance care planning   . Acute renal failure ([place])   . Hypokalemia 02/23/2017  . Encephalitis   . Leukopenia 02/22/2017  . Deep tissue injury bilateral heels 02/22/2017  . Wound of buttock 02/22/2017  . Goals of care, counseling/discussion   . Palliative care by specialist   . Sepsis ([place]) 02/16/2017  . Acute encephalopathy 02/15/2017  . Acute urinary retention 02/08/2017  . Genital herpes 02/08/2017  . Anemia of chronic disease 02/08/2017  . Constipation 02/08/2017  . Protein-calorie malnutrition, severe 01/28/2017  . Pressure injury of skin  01/16/2017  . AKI (acute kidney injury) ([place])   . HIV (human immunodeficiency virus infection) ([place])   . Meningitis   . 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 Acute blood loss anemia   . Elevated intracranial pressure   . HIV disease ([place])   . Lymphadenopathy of head and neck   . Rectal abscess   . Meningoencephalitis   . 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 Cavitary pneumonia   . Diffuse lymphadenopathy   . Drug rash   . Hypomagnesemia 12/23/2016  . Encephalopathy acute 12/23/2016  . SIRS (systemic inflammatory response syndrome) ([place]) 12/23/2016  . Acute respiratory failure ([place]) 12/23/2016  . Pulmonary infiltrates   . Perirectal abscess 12/21/2016  . Hyponatremia 12/21/2016  . Normocytic anemia 12/21/2016  . Elevated LFTs 12/21/2016  . AIDS (acquired immune deficiency syndrome) ([place]) 07/07/2016  . Chronic pain 06/20/2011    Palliative Care Assessment & Plan   HPI: 31 y.o. 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 He was recently here 4/98-2/6 with a complicated course due to disseminated cryptococcal infection and cryptococcal meningoencephalitis. He now presents from SNF with worsening encephalopathy and decreased oral intake. He was admitted on 02/15/2017. On admission he was was found to be septic in the setting of recurrent cryptococcal meningitis; ID following. He also had increased ICP for which he is having serial LPs to monitor (VP shunt to be considered if ICPs climb again); Neurology consulted and advised. 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 Palliative consulted to assist family in clarifying goals of care given the patient's recurrent health issues and now  significant failure to thrive.   Assessment: [name] obtained guardianship, with papers reviewed by my partner NP Mahan on 8/3. She has subsequently changed William Pena's code status to DNR. After extensive conversations over the past week [name] decided to transition William Pena to full comfort measures, which occurred on 8/16. His Cortrak was removed and antibiotics and ARVs stopped.   I followed-up today to ensure ongoing symptom management in what is now considered end of life care. [name] is clearly exhausted and has been consistently present at his bedside during the majority of her non-work hours. She does shift work. She and I had a long discussion about comfort and how to assess for it when he is not verbally interactive. She reminisced about who he had been as a child and young adult and was tearful as she explained how much had changed. She remains very hopeful that he can  spend the remainder of his time being cared for and in a safe and loving place.   In terms of disposition, she would ideally like for him to be out of the hospital. Her preference (in order) is [facility], [facility], then [facility] with Hospice.    Recommendations/Plan:  Continue full comfort measures  SW working on discharge plan. Residential hospice is ideal, if SNF is the only option he has to go with Hospice support. Palliative is not adequate for his needs.   Goals of Care and Additional Recommendations: Limitations on Scope of Treatment: Full Comfort Care  Code Status:  DNR  Prognosis:   < 2 weeks in the setting of AIDS in which ARVs have been stopped, cryptococcal meningitis for which abx have been stopped, and poor oral intake with significant wounds.   Discharge Planning:  Pt will need Hospice support. First choice is a residential hospice facility, last resort would be SNF with Hospice.  Care plan was discussed with pt's mother, CM, SW  Thank you for allowing the Palliative Medicine Team to  assist in the care of this patient.  Total time: 35 minutes    Greater than 50%  of this time was spent counseling and coordinating care related to the above assessment and plan.  Charlynn Court, NP Palliative Medicine Team [phone] pager (7a-5p) Team Phone # [phone]

## 2017-03-17 NOTE — Discharge Summary (Signed)
Physician Discharge Summary  William Pena OIZ:[id number] DOB: Nov 21, 1985 DOA: 02/15/2017  PCP: [name], MD  Admit date: 02/15/2017 Discharge date: 03/17/2017  Admitted From:SNF Disposition:SNF with hospice  Recommendations for Outpatient Follow-up:  1. 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 Initial impression from ID was that he had relapsed cryptococcal meningitis. He was started on diflucan, s/p serial LP. Also started on HIV meds.  Not a candidate for peg placement per IR.   Patient with no significant improvement in mental status clinical condition. Patient remained unresponsive and not following commands. Palliative care service evaluated the patient and had a long discussion with the social worker and family member. Patient was made comfort care and no discharging to hospice care service. Medications were discontinued except comfort medication.  # Sepsis ([name] to cryptococcal meningeal encephalitis  -Was on fluconazole. No improvement. Currently on comfort care.  #AIDS (acquired immune deficiency syndrome) (HCC)/HIV infection -Off ARV - IR consulted and declined PEG placement due to risk over weight benefit in current condition,   #Encephalopathy acute :likely due to cryptococcal meningeal encephalitis vs anoxic brain injury.  - No change in mental status. - Patient continues to be non-verbal and with staring spells, he had left sided gaze, seems to have right sided neglect, unchanged  #AKI (acute kidney injury)  ([place]) -improved  # Hypokalemia/hypomag/Hyponatremia - improved.   #Protein-calorie malnutrition, severe - Initially discussed with GI who recommended IR consultation. IR declined PEG placement due to risk overweight benefit, -cortrack feeding tube placed was removed since pt is in comfort and hospice care.  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   # full thickness wounds to bilat buttocks; 2X.5X.5cm, 1X1X1cm, 5.5X3X.2cm - Continue wound care per WOC. Deep tissue injuries bilateral heels  Discharge Diagnoses:  Principal Problem:   Sepsis ([place]) Active Problems:   AIDS (acquired immune deficiency syndrome) (HCC)   Encephalopathy acute   Meningoencephalitis   HIV (human immunodeficiency virus infection) ([place])   AKI (acute kidney injury) ([place])   Protein-calorie malnutrition, severe   Anemia of chronic disease   Acute encephalopathy   Goals of care, counseling/discussion   Palliative care by specialist   Leukopenia   Deep tissue injury bilateral heels   Wound of buttock   Hypokalemia   Encephalitis   Acute renal failure ([place])   Encephalopathy   Advance care planning    Discharge Instructions  Discharge Instructions    Diet general    Complete by:  As directed    Discharge instructions    Complete by:  As directed    Hospice follow up   Increase activity slowly    Complete by:  As directed      Allergies as of 03/17/2017      Reactions   Carrot Oil Swelling   "throat swell"    Augmentin [amoxicillin-pot Clavulanate] Rash   "break out"      Medication List    STOP taking these medications   amantadine 50 MG/5ML solution Commonly known as:  SYMMETREL   azithromycin 600 MG tablet Commonly known as:  ZITHROMAX   docusate sodium  100 MG capsule Commonly known as:  COLACE   fluconazole 200 MG tablet Commonly  known as:  DIFLUCAN   HYDROcodone-acetaminophen 5-325 MG tablet Commonly known as:  NORCO/VICODIN   magnesium oxide 400 (241.3 Mg) MG tablet Commonly known as:  MAG-OX   multivitamin with minerals Tabs tablet   ondansetron 4 MG disintegrating tablet Commonly known as:  ZOFRAN ODT   senna-docusate 8.6-50 MG tablet Commonly known as:  Senokot-S   sulfamethoxazole-trimethoprim 800-160 MG tablet Commonly known as:  BACTRIM DS,SEPTRA DS   thiamine 100 MG tablet     TAKE these medications   feeding supplement (ENSURE ENLIVE) Liqd Take 237 mLs by mouth 2 (two) times daily between meals.   feeding supplement (PRO-STAT SUGAR FREE 64) Liqd Take 30 mLs by mouth at bedtime.   nystatin 100000 UNIT/ML suspension Commonly known as:  MYCOSTATIN Take 10 mLs by mouth 4 (four) times daily.   polyethylene glycol packet Commonly known as:  MIRALAX / GLYCOLAX Take 17 g by mouth daily as needed for mild constipation.   tamsulosin 0.4 MG Caps capsule Commonly known as:  FLOMAX Take 1 capsule (0.4 mg total) by mouth daily.       Contact information for follow-up providers    [name], [name], MD. Schedule an appointment as soon as possible for a visit in 1 week(s).   Specialty:  Infectious Diseases Contact information: [place] [address] [phone]            Contact information for after-discharge care    Destination    [facility] Follow up.   Specialty:  [place] information: [place] [phone]                 Allergies  Allergen Reactions  . Carrot Oil Swelling    "throat swell"   . Augmentin [Amoxicillin-Pot Clavulanate] Rash    "break out"    Consultations:  ID (signed off)  Neurology (signed off)  Hematology Dr Marin Olp (signed off)  Palliative Care  IR recommended against peg tube placement   Nutrition  Social  worker  Speech/PT/OT  Procedures/Studies:LP   Subjective: Seen and examined at bedside. Patient was not responsive with fixed lateral gaze. No change in mental status.  Discharge Exam: Vitals:   03/17/17 0510 03/17/17 1305  BP: 118/80 109/74  Pulse: 82 81  Resp: 16 18  Temp:  98.3 F (36.8 C)  SpO2: 100% 100%   Vitals:   03/16/17 1457 03/16/17 2220 03/17/17 0510 03/17/17 1305  BP: 129/79 121/76 118/80 109/74  Pulse: 72 85 82 81  Resp: 18 18 16 18   Temp: 97.8 F (36.6 C)   98.3 F (36.8 C)  TempSrc:    Oral  SpO2: 95% 100% 100% 100%  Weight:      Height:        General: Unresponsive male with left-sided fixed gaze Cardiovascular: RRR, S1/S2 +, no rubs, no gallops Respiratory: CTA bilaterally, no wheezing, no rhonchi Abdominal: Soft, ND, bowel sounds + Extremities: no edema    The results of significant diagnostics from this hospitalization (including imaging, microbiology, ancillary and laboratory) are listed below for reference.     Microbiology: No results found for this or any previous visit (from the past 240 hour(s)).   Labs: BNP (last 3 results) No results for input(s): BNP in the last 8760 hours. Basic Metabolic Panel:  Recent Labs Lab 03/11/17 0402  03/11/17 1825 03/13/17 0316 03/15/17 1904  NA 136 132* 133* 130*  K 4.4 4.4 4.3 4.4  CL 108 105 105 102  CO2 20* 21* 22 21*  GLUCOSE 104* 97 92 100*  BUN 14 11 13 19   CREATININE 0.50* 0.49* 0.39* 0.61  CALCIUM 8.6* 8.6* 8.9 8.8*  MG 1.7  --  1.5*  --   PHOS  --   --  3.9  --    Liver Function Tests: No results for input(s): AST, ALT, ALKPHOS, BILITOT, PROT, ALBUMIN in the last 168 hours. No results for input(s): LIPASE, AMYLASE in the last 168 hours. No results for input(s): AMMONIA in the last 168 hours. CBC:  Recent Labs Lab 03/11/17 1825 03/15/17 1904  WBC 4.4 4.8  HGB 10.2* 9.5*  HCT 30.9* 29.1*  MCV 85.1 84.8  PLT 163 189   Cardiac Enzymes: No results for input(s): CKTOTAL,  CKMB, CKMBINDEX, TROPONINI in the last 168 hours. BNP: Invalid input(s): POCBNP CBG:  Recent Labs Lab 03/16/17 0759 03/16/17 1225 03/16/17 1655 03/17/17 0804 03/17/17 1154  GLUCAP 101* 96 90 79 157*   D-Dimer No results for input(s): DDIMER in the last 72 hours. Hgb A1c No results for input(s): HGBA1C in the last 72 hours. Lipid Profile No results for input(s): CHOL, HDL, LDLCALC, TRIG, CHOLHDL, LDLDIRECT in the last 72 hours. Thyroid function studies No results for input(s): TSH, T4TOTAL, T3FREE, THYROIDAB in the last 72 hours.  Invalid input(s): FREET3 Anemia work up No results for input(s): VITAMINB12, FOLATE, FERRITIN, TIBC, IRON, RETICCTPCT in the last 72 hours. Urinalysis    Component Value Date/Time   COLORURINE YELLOW 02/15/2017 1342   APPEARANCEUR HAZY (A) 02/15/2017 1342   LABSPEC 1.021 02/15/2017 1342   PHURINE 6.0 02/15/2017 1342   GLUCOSEU NEGATIVE 02/15/2017 1342   HGBUR SMALL (A) 02/15/2017 1342   BILIRUBINUR NEGATIVE 02/15/2017 1342   KETONESUR NEGATIVE 02/15/2017 1342   PROTEINUR 30 (A) 02/15/2017 1342   NITRITE POSITIVE (A) 02/15/2017 1342   LEUKOCYTESUR TRACE (A) 02/15/2017 1342   Sepsis Labs Invalid input(s): PROCALCITONIN,  WBC,  LACTICIDVEN Microbiology No results found for this or any previous visit (from the past 240 hour(s)).   Time coordinating discharge: 30 minutes  SIGNED:   Rosita Fire, MD  [facility] 03/17/2017, 1:13 PM  If 7PM-7AM, please contact night-coverage [email or web address] Password [email or web address]

## 2017-03-17 NOTE — Progress Notes (Signed)
Report called and given to Ander Purpura, RN at [facility]. Pt ready to be discharged. Awaiting medical transport.

## 2017-03-17 NOTE — Plan of Care (Signed)
Problem: Physical Regulation: Goal: Signs and symptoms of infection will decrease Outcome: Progressing Pt will be free from signs and symptoms of sepsis prior to discharge.   

## 2017-03-17 NOTE — NC FL2 (Signed)
[place] LEVEL OF CARE SCREENING TOOL     IDENTIFICATION  Patient Name: William Pena Birthdate: 07-29-1986 Sex: male Admission Date (Current Location): 02/15/2017  [facility] and [place] Number:  Herbalist and Address:  [facility], [address] [place], [place] [address]      Provider Number: [id number]  Attending Physician Name and Address:  Rosita Fire, MD  Relative Name and Phone Number:       Current Level of Care: Hospital Recommended Level of Care: [place] Prior Approval Number:    Date Approved/Denied:   PASRR Number: [id number]  Discharge Plan: SNF    Current Diagnoses: Patient Active Problem List   Diagnosis Date Noted  . Encephalopathy   . Advance care planning   . Acute renal failure ([place])   . Hypokalemia 02/23/2017  . Encephalitis   . Leukopenia 02/22/2017  . Deep tissue injury bilateral heels 02/22/2017  . Wound of buttock 02/22/2017  . Goals of care, counseling/discussion   . Palliative care by specialist   . Sepsis ([place]) 02/16/2017  . Acute encephalopathy 02/15/2017  . Acute urinary retention 02/08/2017  . Genital herpes 02/08/2017  . Anemia of chronic disease 02/08/2017  . Constipation 02/08/2017  . Protein-calorie malnutrition, severe 01/28/2017  . Pressure injury of skin 01/16/2017  . AKI (acute kidney injury) ([place])   . HIV (human immunodeficiency virus infection) ([place])   . Meningitis   . 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 Acute blood loss anemia   . Elevated intracranial pressure   . HIV disease ([place])   . Lymphadenopathy of head and neck   . Rectal abscess   . Meningoencephalitis   . 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 Cavitary pneumonia   . Diffuse lymphadenopathy   . Drug rash   . Hypomagnesemia 12/23/2016  . Encephalopathy acute 12/23/2016  . SIRS (systemic inflammatory response syndrome) ([place]) 12/23/2016  . Acute respiratory failure ([place]) 12/23/2016   . Pulmonary infiltrates   . Perirectal abscess 12/21/2016  . Hyponatremia 12/21/2016  . Normocytic anemia 12/21/2016  . Elevated LFTs 12/21/2016  . AIDS (acquired immune deficiency syndrome) ([place]) 07/07/2016  . Chronic pain 06/20/2011    Orientation RESPIRATION BLADDER Height & Weight      (unable to assess; patient nonverbal)  Normal Incontinent, External catheter Weight: 82 lb (37.2 kg) Height:  5\' 6"  (167.6 cm)  BEHAVIORAL SYMPTOMS/MOOD NEUROLOGICAL BOWEL NUTRITION STATUS      Incontinent Diet (See discharge summary)  AMBULATORY STATUS COMMUNICATION OF NEEDS Skin   Extensive Assist Non-Verbally PU Stage and Appropriate Care (open of dehised 01/21/17 lateral buttocks)   PU Stage 2 Dressing: Daily (Left ankle) PU Stage 3 Dressing: Daily (Medical buttocks, right buttocks, left heel)                 Personal Care Assistance Level of Assistance  Bathing, Dressing, Feeding Bathing Assistance: Maximum assistance Feeding assistance: Limited assistance Dressing Assistance: Maximum assistance     Functional Limitations Info  Speech     Speech Info: Impaired    SPECIAL CARE FACTORS FREQUENCY  PT (By licensed PT), OT (By licensed OT)     PT Frequency: 5x/wk OT Frequency: 5x/wk            Contractures Contractures Info: Present (Left & right grip)    Additional Factors Info  Code Status Code Status Info: DNR Allergies Info: Carrot Oil, Augmentin Amoxicillin-pot Clavulanate  Current Medications (03/17/2017):  This is the current hospital active medication list Current Facility-Administered Medications  Medication Dose Route Frequency Provider Last Rate Last Dose  . acetaminophen (TYLENOL) tablet 650 mg  650 mg Oral Q6H PRN Donne Hazel, MD       Or  . acetaminophen (TYLENOL) suppository 650 mg  650 mg Rectal Q6H PRN Donne Hazel, MD   650 mg at 02/16/17 1052  . feeding supplement (ENSURE ENLIVE) (ENSURE ENLIVE) liquid 237 mL  237 mL Oral BID BM  Florencia Reasons, MD   237 mL at 03/17/17 1253  . labetalol (NORMODYNE,TRANDATE) injection 5 mg  5 mg Intravenous Q2H PRN Donne Hazel, MD   5 mg at 03/06/17 1714  . LORazepam (ATIVAN) injection 0.5 mg  0.5 mg Intravenous Q4H PRN Jannette Fogo, NP      . morphine 2 MG/ML injection 1 mg  1 mg Intravenous Q3H PRN Jannette Fogo, NP       Or  . oxyCODONE (Oxy IR/ROXICODONE) immediate release tablet 5 mg  5 mg Oral Q3H PRN Jannette Fogo, NP      . nystatin (MYCOSTATIN) 100000 UNIT/ML suspension 500,000 Units  5 mL Oral QID Jannette Fogo, NP   500,000 Units at 03/17/17 1252  . ondansetron (ZOFRAN) injection 4 mg  4 mg Intravenous Q6H PRN Jannette Fogo, NP      . polyethylene glycol (MIRALAX / GLYCOLAX) packet 17 g  17 g Oral Daily PRN Jannette Fogo, NP      . polyvinyl alcohol (LIQUIFILM TEARS) 1.4 % ophthalmic solution 1 drop  1 drop Both Eyes PRN Rai, Ripudeep K, MD   1 drop at 02/27/17 0956  . polyvinyl alcohol (LIQUIFILM TEARS) 1.4 % ophthalmic solution 1 drop  1 drop Both Eyes QID Rama, Venetia Maxon, MD   1 drop at 03/17/17 1253  . tamsulosin (FLOMAX) capsule 0.4 mg  0.4 mg Oral Daily Donne Hazel, MD   0.4 mg at 03/17/17 7680     Discharge Medications: Please see discharge summary for a list of discharge medications.  Relevant Imaging Results:  Relevant Lab Results:   Additional Information SS#: [id number]  Truitt Merle, LCSW

## 2017-03-17 NOTE — Progress Notes (Signed)
Spoke with Margaretha Sheffield with [facility] dispatch. There is no set pick up time for pt via [facility]. Pt is on the list and transport will be here sometime tonight to pick up pt.

## 2017-03-17 NOTE — Progress Notes (Signed)
[facility] Liaison: RN note  Received request from Cedric Fishman,  [place] for family interest in [facility]. Unfortunately [facility] is not able to offer a room today. CSW is aware and [facility] liaison will follow up with CSW tomorrow or sooner if a room becomes available.    Please do not hesitate to call with questions.  Thank you.  Farrel Gordon, RN, [facility] Liaison  [phone]   All hospital liaisons are on [place].

## 2017-03-17 NOTE — Progress Notes (Signed)
Conferred w/ palliative nurse and stopped by again to offer support to pt's mom [name]. He's awaiting med transport to facility and she is not in rm. Offered silent prayer for pt, who looks peaceful. Chaplain available for f/u.    03/17/17 1500  Clinical Encounter Type  Visited With Patient not available;Health care provider  Visit Type Follow-up;Psychological support;Spiritual support;Social support  Referral From Family  Spiritual Encounters  Spiritual Needs Emotional;Grief support  Stress Factors  Patient Stress Factors Other (Comment) (going to hospice)  Family Stress Factors Loss   Gerrit Heck, Chaplain

## 2017-03-17 NOTE — Clinical Social Work Note (Signed)
Pt ready for discharge and returning to [facility]. CSW sent clinicals to [place] at [facility] and confirmed bed. RN aware and will call report to [phone] for room 309. CSW arranged transportation with [facility]. CSW updated pt's mother-[name] [place] at [phone]. Signed DNR in packet. CSW signing off as no further Social Work needs identified.   Oretha Ellis, [place], [place] Work [phone]

## 2017-03-17 NOTE — Progress Notes (Signed)
[facility] not able to accept patient. [facility] does not have beds. [facility] is able to accept patient back today with Hospice following at the SNF.  Percell Locus Fielding Mault LCSWA [phone]

## 2017-03-20 ENCOUNTER — Encounter: Payer: Self-pay | Admitting: Internal Medicine

## 2017-03-20 ENCOUNTER — Non-Acute Institutional Stay (SKILLED_NURSING_FACILITY): Payer: Medicaid Other | Admitting: Internal Medicine

## 2017-03-20 DIAGNOSIS — A419 Sepsis, unspecified organism: Secondary | ICD-10-CM

## 2017-03-20 DIAGNOSIS — D638 Anemia in other chronic diseases classified elsewhere: Secondary | ICD-10-CM

## 2017-03-20 DIAGNOSIS — Z21 Asymptomatic human immunodeficiency virus [HIV] infection status: Secondary | ICD-10-CM

## 2017-03-20 DIAGNOSIS — B451 Cerebral cryptococcosis: Secondary | ICD-10-CM | POA: Diagnosis not present

## 2017-03-20 DIAGNOSIS — N179 Acute kidney failure, unspecified: Secondary | ICD-10-CM | POA: Diagnosis not present

## 2017-03-20 DIAGNOSIS — E876 Hypokalemia: Secondary | ICD-10-CM

## 2017-03-20 DIAGNOSIS — R6889 Other general symptoms and signs: Secondary | ICD-10-CM

## 2017-03-20 DIAGNOSIS — E871 Hypo-osmolality and hyponatremia: Secondary | ICD-10-CM

## 2017-03-20 DIAGNOSIS — S31819D Unspecified open wound of right buttock, subsequent encounter: Secondary | ICD-10-CM

## 2017-03-20 DIAGNOSIS — E43 Unspecified severe protein-calorie malnutrition: Secondary | ICD-10-CM

## 2017-03-20 DIAGNOSIS — D696 Thrombocytopenia, unspecified: Secondary | ICD-10-CM | POA: Diagnosis not present

## 2017-03-20 DIAGNOSIS — B20 Human immunodeficiency virus [HIV] disease: Secondary | ICD-10-CM

## 2017-03-20 DIAGNOSIS — D72818 Other decreased white blood cell count: Secondary | ICD-10-CM | POA: Diagnosis not present

## 2017-03-20 DIAGNOSIS — G9340 Encephalopathy, unspecified: Secondary | ICD-10-CM | POA: Diagnosis not present

## 2017-03-20 NOTE — Progress Notes (Signed)
: Provider:  Noah Delaine. Sheppard Coil, MD Location:  [facility] Room Number: [phone] Place of Service:  SNF ([phone])  PCP: [name], [name], MD Patient Care Team: [name], [name], MD as PCP - General (Infectious Diseases)  Extended Emergency Contact Information Primary Emergency Contact: Wetzel Bjornstad Address: [address] [place], [place] [address] Johnnette Litter of [place] Phone: [phone] Mobile Phone: [phone] Relation: Mother Secondary Emergency Contact: [place] of [place] Phone: [phone] Relation: Grandmother Father: Jacub, [place] of [place] Mobile Phone: [phone]     Allergies: Carrot oil and Augmentin [amoxicillin-pot clavulanate]  Chief Complaint  Patient presents with  . New Admit To SNF    Admit to Facility    HPI: Patient is 31 y.o. male who was admitted to [facility] from 7/18-8/17/2018 for altered mental status. 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 The patient is nonverbal at baseline but normally can follow some commands and respond to his name and eye contact. The morning of admission patient was unresponsive to voice or commands or touch. When EMS arrived he had a Glasgow Coma Score of 5. Initial impression from ID was that patient had relapsing cryptococcal meningitis and he was started on Diflucan with serial lumbar punctures he was also started on HIV meds. Patient is not a candidate for PEG placement per IR. Patient in no significant improvement in her mental status. Patient remained unresponsive and not following commands.  The palliative care evaluated the patient had a long discussion with the social worker and family member. It was decided the patient be made comfort  care since patient is discharging to skilled nursing facility for hospice care, all medications being discontinued except for comfort medication. On admission to this skilled nursing facility there was noted that patient ate and drank a little from his mother several days ago but on today's examination per mother patient looks much much worse. Hospice is present at this time.  Past Medical History:  Diagnosis Date  . Acute respiratory failure ([facility]) 12/23/2016  . AIDS (acquired immune deficiency syndrome) ([place]) 07/07/2016  . AKI (acute kidney injury) ([place])   . Cavitary pneumonia   . 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 Diffuse lymphadenopathy   . 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 Drug rash   . Elevated intracranial pressure   . Elevated LFTs 12/21/2016  . 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 Encephalopathy acute 12/23/2016  . HIV (human immunodeficiency virus infection) ([place])   . HIV disease ([place])   . Hypomagnesemia 12/23/2016  . Hyponatremia 12/21/2016  . Lymphadenopathy of head and neck   . Meningitis   . Normocytic anemia 12/21/2016  . Perirectal abscess   . Protein-calorie malnutrition, severe 01/28/2017  . Pulmonary infiltrates   . SIRS (systemic inflammatory response syndrome) ([place]) 12/23/2016    Past Surgical History:  Procedure Laterality Date  . FLEXIBLE SIGMOIDOSCOPY  04/04/2008  . INCISION AND DRAINAGE PERIRECTAL ABSCESS    . INCISION AND DRAINAGE PERIRECTAL ABSCESS N/A 12/21/2016   Procedure: IRRIGATION AND DEBRIDEMENT PERIRECTAL ABSCESS;  Surgeon: Ralene Ok, MD;  Location: [facility];  Service: General;  Laterality: N/A;  . PLACEMENT OF LUMBAR DRAIN N/A 12/27/2016   Procedure: PLACEMENT OF LUMBAR DRAIN;  Surgeon: Consuella Lose, MD;  Location: [facility];  Service: Neurosurgery;  Laterality: N/A;    Allergies as of 03/20/2017      Reactions   Carrot Oil Swelling   "throat swell"    Augmentin [amoxicillin-pot Clavulanate] Rash   "break out"      Medication List        Accurate as of 03/20/17  9:08 AM. Always use your most recent med list.          feeding supplement (ENSURE ENLIVE) Liqd Take 237 mLs by mouth 2 (two) times daily between meals.   feeding supplement (PRO-STAT SUGAR FREE 64) Liqd Take 30 mLs by mouth at bedtime.   nystatin 100000 UNIT/ML suspension Commonly known as:  MYCOSTATIN Take 10 mLs by mouth 4 (four) times daily.   polyethylene glycol packet Commonly known as:  MIRALAX / GLYCOLAX Take 17 g by mouth daily as needed for mild constipation.   tamsulosin 0.4 MG Caps capsule Commonly known as:  FLOMAX Take 1 capsule (0.4 mg total) by mouth daily.       No orders of the defined types were placed in this encounter.   Immunization History  Administered Date(s) Administered  . Influenza-Unspecified 05/01/2016  . Pneumococcal Polysaccharide-23 02/19/2017    Social History  Substance Use Topics  . Smoking status: Former Smoker    Packs/day: 0.33    Types: Cigarettes  . Smokeless tobacco: Never Used  . Alcohol use No    Family history is   Family History  Problem Relation Age of Onset  . Diabetes Other       Review of SystemsUnable to obtain secondary to unresponsive; per mother patient looks much worse today than he did several days ago.     Vitals:   03/20/17 0902  BP: 120/73  Pulse: 97  Resp: 17  Temp: 98.8 F (37.1 C)  SpO2: 98%    SpO2 Readings from Last 1 Encounters:  03/20/17 98%   Body mass index is 19.24 kg/m.     Physical Exam  GENERAL APPEARANCE: Unresponsive,  No acute distress.  SKIN: No diaphoresis rash HEAD: Normocephalic, atraumatic  EYES: Conjunctiva/lids clear. Pupils round, I did not remove.  EARS: External exam WNL, canals clear. Hearing grossly normal.  NOSE: No deformity or discharge.  MOUTH/THROAT: Lips w/o lesions  RESPIRATORY: Breathing is even, unlabored. Lung sounds are clear   CARDIOVASCULAR: Heart RRR no murmurs, rubs or gallops. No peripheral edema.     GASTROINTESTINAL: Abdomen is soft, non-tender, not distended w/ decreased bowel sounds. GENITOURINARY: Bladder non tender, not distended  MUSCULOSKELETAL: Cachectic NEUROLOGIC:  Patient unresponsive and did not move PSYCHIATRIC: Not applicable  Patient Active Problem List   Diagnosis Date Noted  . Encephalopathy   . Advance care planning   . Acute renal failure ([place])   . Hypokalemia 02/23/2017  . Encephalitis   . Leukopenia 02/22/2017  . Deep tissue injury bilateral heels 02/22/2017  . Wound of buttock 02/22/2017  . Goals of care, counseling/discussion   . Palliative care by specialist   . Sepsis ([place]) 02/16/2017  . Acute encephalopathy 02/15/2017  . Acute urinary retention 02/08/2017  . Genital herpes 02/08/2017  . Anemia of chronic disease 02/08/2017  . Constipation 02/08/2017  . Protein-calorie malnutrition, severe 01/28/2017  . Pressure injury of skin 01/16/2017  . AKI (acute kidney injury) ([place])   . HIV (human immunodeficiency virus infection) ([place])   . Meningitis   . 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 Acute blood loss anemia   .  Elevated intracranial pressure   . HIV disease ([place])   . Lymphadenopathy of head and neck   . Rectal abscess   . Meningoencephalitis   . 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 Cavitary pneumonia   . Diffuse lymphadenopathy   . Drug rash   . Hypomagnesemia 12/23/2016  . Encephalopathy acute 12/23/2016  . SIRS (systemic inflammatory response syndrome) ([place]) 12/23/2016  . Acute respiratory failure ([place]) 12/23/2016  . Pulmonary infiltrates   . Perirectal abscess 12/21/2016  . Hyponatremia 12/21/2016  . Normocytic anemia 12/21/2016  . Elevated LFTs 12/21/2016  . AIDS (acquired immune deficiency syndrome) ([place]) 07/07/2016  . Chronic pain 06/20/2011      Labs reviewed: Basic Metabolic Panel:    Component Value Date/Time   NA 130 (L) 03/15/2017 1904   NA 135 (A) 02/13/2017   K 4.4 03/15/2017 1904   CL 102 03/15/2017 1904   CO2 21 (L)  03/15/2017 1904   GLUCOSE 100 (H) 03/15/2017 1904   BUN 19 03/15/2017 1904   BUN 26 (A) 02/13/2017   CREATININE 0.61 03/15/2017 1904   CALCIUM 8.8 (L) 03/15/2017 1904   PROT 8.8 (H) 03/01/2017 0325   ALBUMIN 2.6 (L) 03/01/2017 0325   AST 26 03/01/2017 0325   ALT 28 03/01/2017 0325   ALKPHOS 115 03/01/2017 0325   BILITOT 0.5 03/01/2017 0325   GFRNONAA >60 03/15/2017 1904   GFRAA >60 03/15/2017 1904     Recent Labs  03/09/17 0445 03/10/17 0332 03/11/17 0402 03/11/17 1825 03/13/17 0316 03/15/17 1904  NA 133* 135 136 132* 133* 130*  K 4.9 3.9 4.4 4.4 4.3 4.4  CL 107 107 108 105 105 102  CO2 16* 20* 20* 21* 22 21*  GLUCOSE 72 93 104* 97 92 100*  BUN 12 15 14 11 13 19   CREATININE 0.53* 0.44* 0.50* 0.49* 0.39* 0.61  CALCIUM 8.8* 8.6* 8.6* 8.6* 8.9 8.8*  MG 1.5* 1.6* 1.7  --  1.5*  --   PHOS 4.2 3.8  --   --  3.9  --    Liver Function Tests:  Recent Labs  02/05/17 0545 02/06/17 0507 02/16/17 0518 03/01/17 0325  AST 32  --  35 26  ALT 26  --  31 28  ALKPHOS 90  --  94 115  BILITOT 0.4  --  0.6 0.5  PROT 8.7*  --  7.6 8.8*  ALBUMIN 2.3* 2.3* 2.0* 2.6*   No results for input(s): LIPASE, AMYLASE in the last 8760 hours. No results for input(s): AMMONIA in the last 8760 hours. CBC:  Recent Labs  02/24/17 0333 02/25/17 0306 02/26/17 0300  03/08/17 0300 03/11/17 1825 03/15/17 1904  WBC 1.9* 2.3* 2.3*  < > 7.3 4.4 4.8  NEUTROABS 1.2* 1.1* 1.3*  --   --   --   --   HGB 11.5* 9.9* 10.2*  < > 9.8* 10.2* 9.5*  HCT 34.8* 30.7* 31.5*  < > 30.4* 30.9* 29.1*  MCV 85.1 85.8 86.8  < > 84.9 85.1 84.8  PLT 136* 129* 135*  < > 125* 163 189  < > = values in this interval not displayed. Lipid  Recent Labs  12/26/16 1902  TRIG 144    Cardiac Enzymes: No results for input(s): CKTOTAL, CKMB, CKMBINDEX, TROPONINI in the last 8760 hours. BNP: No results for input(s): BNP in the last 8760 hours. No results found for: MICROALBUR No results found for: HGBA1C No results  found for: TSH Lab Results  Component Value Date  MVEHMCNO70 638 02/17/2017   Lab Results  Component Value Date   FOLATE 13.7 02/17/2017   Lab Results  Component Value Date   IRON 48 02/17/2017   TIBC 106 (L) 02/17/2017   FERRITIN 1,433 (H) 02/17/2017    Imaging and Procedures obtained prior to SNF admission: Dg Chest 1 View  Result Date: 02/15/2017 CLINICAL DATA:  Respiratory failure.  HIV disease. EXAM: CHEST 1 VIEW COMPARISON:  January 08, 2017 FINDINGS: There is no edema or consolidation. Heart size and pulmonary vascularity are normal. No adenopathy. No evident bone lesions. IMPRESSION: No edema or consolidation. Electronically Signed   By: Lowella Grip III M.D.   On: 02/15/2017 13:30   Ct Head Wo Contrast  Result Date: 02/15/2017 CLINICAL DATA:  Initial evaluation for acute altered mental status, history of meningitis with small vessel infarcts. EXAM: CT HEAD WITHOUT CONTRAST TECHNIQUE: Contiguous axial images were obtained from the base of the skull through the vertex without intravenous contrast. COMPARISON:  Comparison made with recent CT from 01/06/2017 as well as brain MRI from 12/27/2016. FINDINGS: Brain: Stable cerebral volume. There has been interval evolution of previously identified infarctions related to meningitis, with progressive hypodensities seen at the level of the left caudate and lentiform nuclei, as well as the cortical gray matter and underlying subcortical/deep white matter of the bilateral frontal lobes near the vertex (series 3, image 24). These areas were seen to be involved on previous brain MRI. No associated mass effect or hemorrhagic transformation. There is a probable new insult with 1 cm hypodensity seen at the right globus pallidus (series 3, image 15), not definitely seen on previous. No associated hemorrhage or mass effect. No other acute large vessel territory infarct. No acute intracranial hemorrhage. No mass lesion or midline shift. No mass effect.  No hydrocephalus. No extra-axial fluid collection. Vascular: No hyperdense vessel. Skull: Scalp soft tissues and calvarium within normal limits. Sinuses/Orbits: Globes and orbital soft tissues within normal limits. Mild scattered mucoperiosteal thickening within the maxillary sinuses and right sphenoid sinus. Visualized paranasal sinuses are otherwise clear. Mastoids and middle ear cavities are clear. IMPRESSION: 1. 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 New 1 cm hypodensity at the right globus pallidus suspicious for new/interval acute/subacute infarction. 2. No evidence for intracranial hemorrhage or other complication. Electronically Signed   By: Jeannine Boga M.D.   On: 02/15/2017 17:31   Dg Fluoro Guide Lumbar Puncture  Result Date: 02/16/2017 CLINICAL DATA:  HNP, cryptococcal meningitis.  Altered mental status EXAM: DIAGNOSTIC LUMBAR PUNCTURE UNDER FLUOROSCOPIC GUIDANCE FLUOROSCOPY TIME:  Fluoroscopy Time:  0 minutes 12 second Radiation Exposure Index (if provided by the fluoroscopic device): Number of Acquired Spot Images: 0 PROCEDURE: Informed consent obtained from the patient's mother who signed the form. Informed consent was obtained prior to the procedure, including potential complications of headache, allergy, and pain. With the patient prone, the lower back was prepped with Betadine. 1% Lidocaine was used for local anesthesia. Lumbar puncture was performed at the L3-4 level using a 20 gauge needle with return of clear CSF with an opening pressure of 44 cm water. Twenty-five ml of CSF were obtained for laboratory studies. Closing pressure 13 cm H2O. The patient tolerated the procedure well and there were no apparent complications. IMPRESSION: Successful lumbar puncture with elevated opening pressure 44 cm water Electronically Signed   By: Franchot Gallo M.D.    On: 02/16/2017 14:10  Not all labs, radiology exams or other studies done during hospitalization come through on my EPIC note; however they are reviewed by me.    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; was on fluconazole but there is no improvement with continued nonverbal, staring spells, left-sided gaze, right sided neglect, unchanged; patient has been made comfort care SNF - admitted to skilled nursing facility for comfort care; will consult hospice if they will take him, that they will not take him we will administer comfort care ourselves  A IDS/HIV INFECTION-off ARV; I am consulted and declined PEG tube placement SNF - admitted for hospice care  ACUTE KIDNEY INJURY/hypokalemia/hypomagnesemia/hyponatremia-all improved with IV fluids SNF - there will be no labs are IV fluids while at skilled nursing  PROTEIN CALORIE malnutrition SEVERE SNF - apparently patient ate for 24-hour period after being admitted to the skilled nursing facility but has not eaten since; patient will be allowed sips of water if he desires; mom says the patient has wanted/been able to drink today  ANEMIA OF CHRONIC DISEASE/THROMBOCYTOPENIA/LEUKOPENIA-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 Sheppard Coil, MD

## 2017-03-21 ENCOUNTER — Encounter: Payer: Self-pay | Admitting: Internal Medicine

## 2017-03-21 DIAGNOSIS — R6889 Other general symptoms and signs: Secondary | ICD-10-CM | POA: Insufficient documentation

## 2017-03-21 DIAGNOSIS — D696 Thrombocytopenia, unspecified: Secondary | ICD-10-CM | POA: Insufficient documentation

## 2017-03-21 DIAGNOSIS — B451 Cerebral cryptococcosis: Secondary | ICD-10-CM | POA: Insufficient documentation

## 2017-03-23 LAB — FUNGUS CULTURE WITH STAIN

## 2017-03-23 LAB — FUNGUS CULTURE RESULT

## 2017-03-23 LAB — FUNGAL ORGANISM REFLEX

## 2017-03-30 LAB — FUNGUS CULTURE RESULT

## 2017-03-30 LAB — FUNGUS CULTURE WITH STAIN

## 2017-03-30 LAB — FUNGAL ORGANISM REFLEX

## 2017-04-01 DEATH — deceased

## 2018-05-26 IMAGING — CT CT HEAD W/O CM
4 series · 17 of 47 positions shown, 19 images · non-contrast
Comparison: CT scan of December 23, 2016.

CLINICAL DATA: Meningitis.

EXAM:
CT HEAD WITHOUT CONTRAST
TECHNIQUE: Contiguous axial images were obtained from the base of the skull
through the vertex without intravenous contrast.

[Series 3: head without · axial · non-contrast · 0.49mm/px · z∈[-51,+69]mm · 7 of 33 slices shown, 9 images]
[im 5/33  brain]
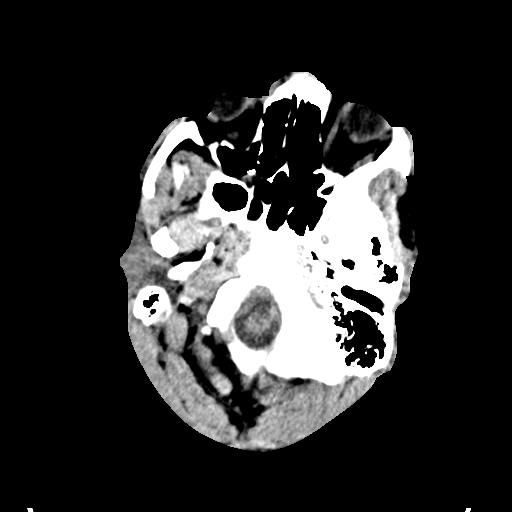
[im 5/33  bone]
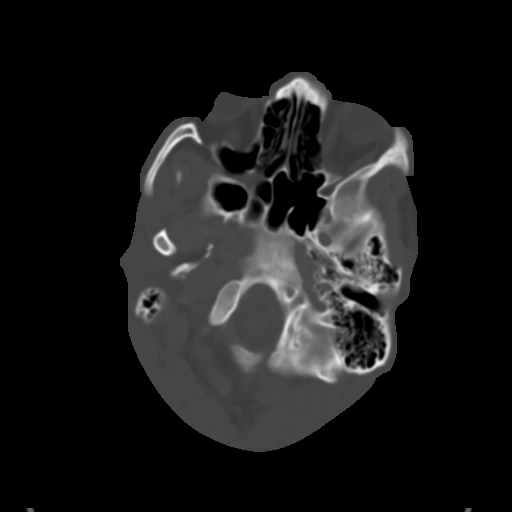
[im 9/33  brain]
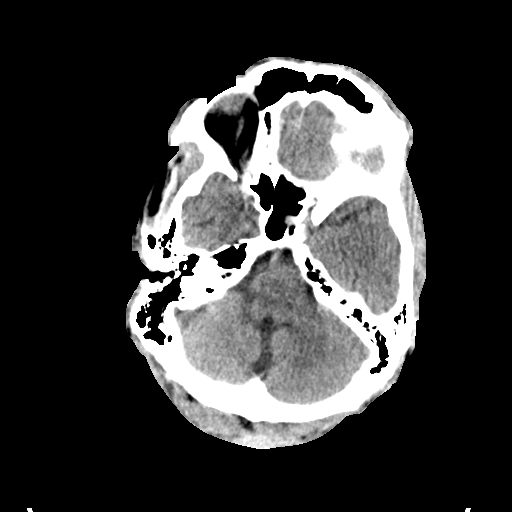
[im 13/33  brain]
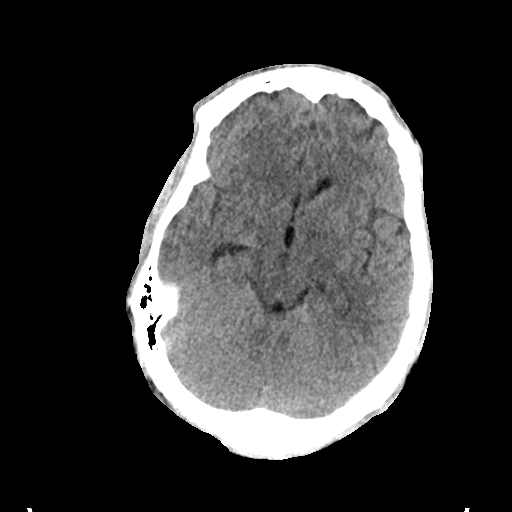
[im 17/33  brain]
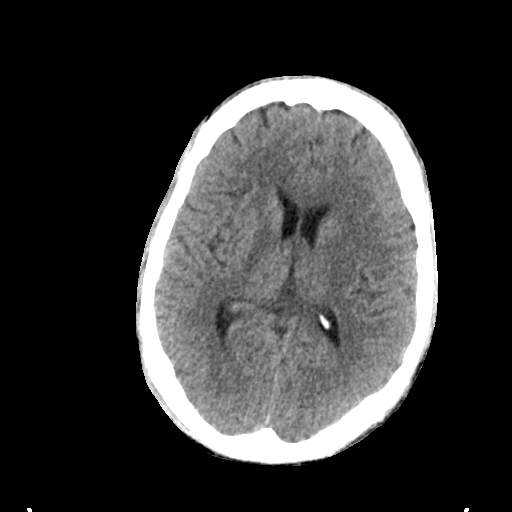
[im 21/33  brain]
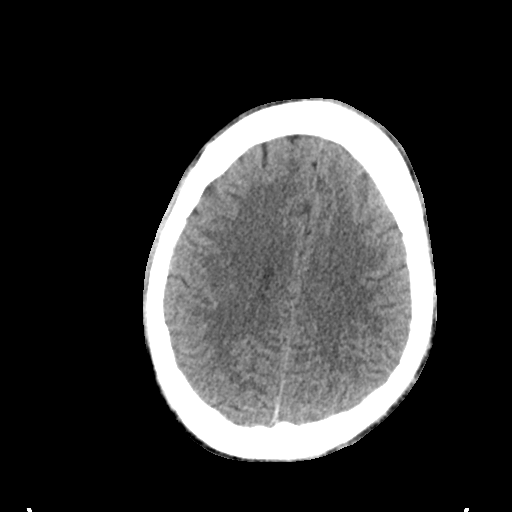
[im 21/33  bone]
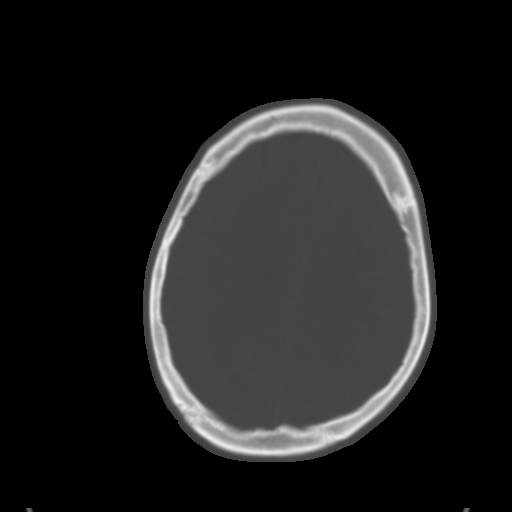
[im 25/33  brain]
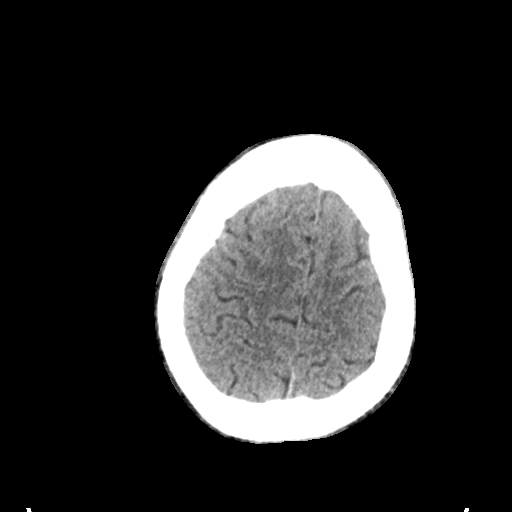
[im 29/33  brain]
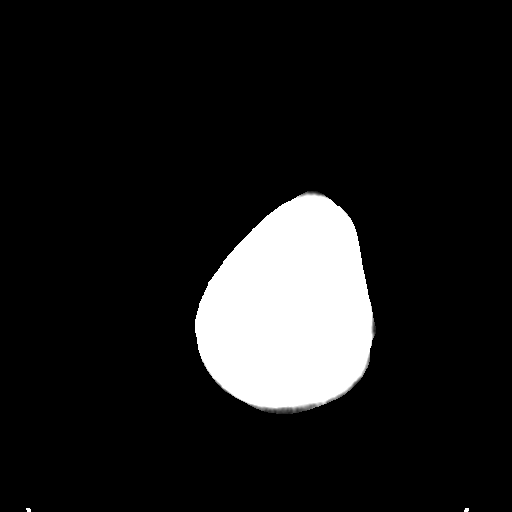

[Series 5: head bone · axial · 0.49mm/px · z∈[-55,+1]mm · 4 of 83 slices shown]
[im 9/83  bone]
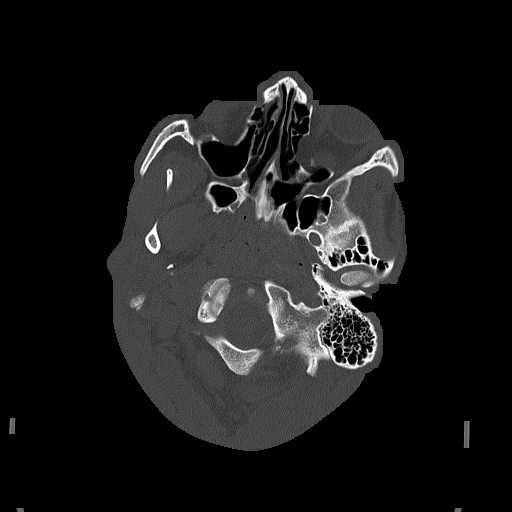
[im 17/83  bone]
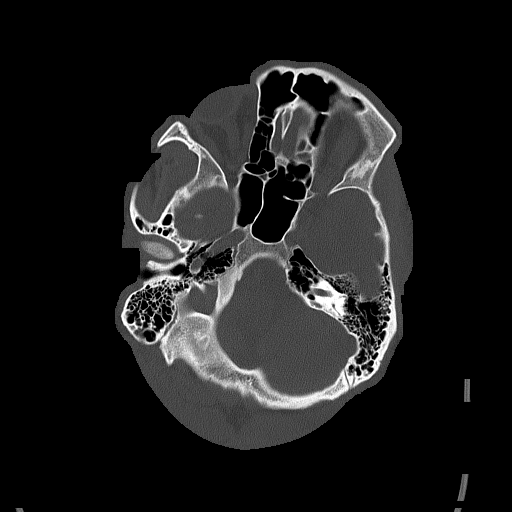
[im 25/83  bone]
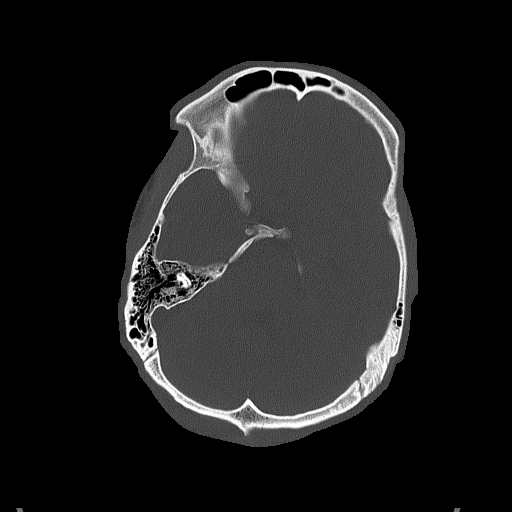
[im 37/83  bone]
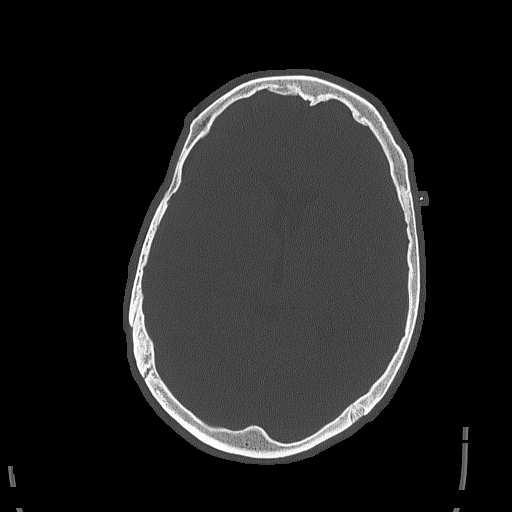

[Series 6: head without cor · coronal · non-contrast · 0.37mm/px · 3 of 75 slices shown]
[im 25/75  brain]
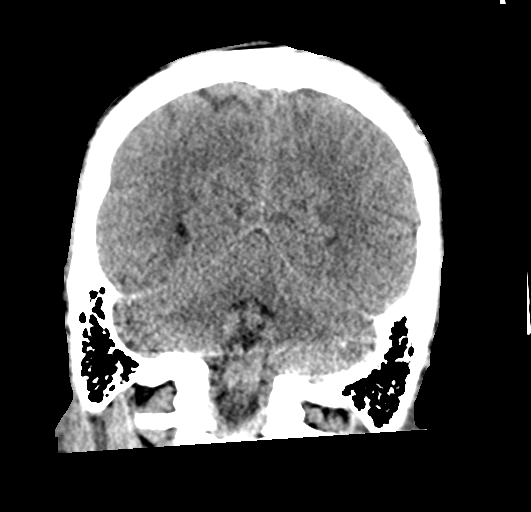
[im 33/75  brain]
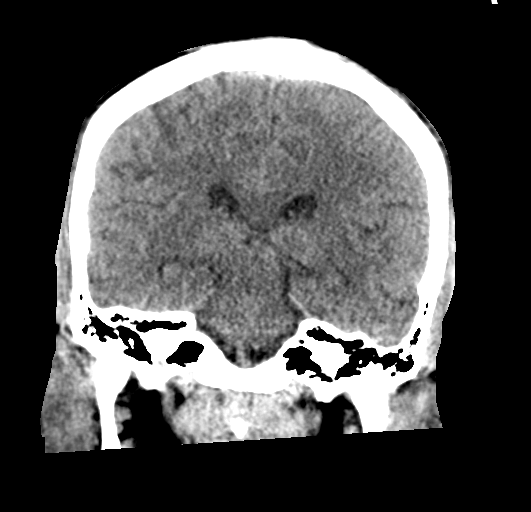
[im 42/75  brain]
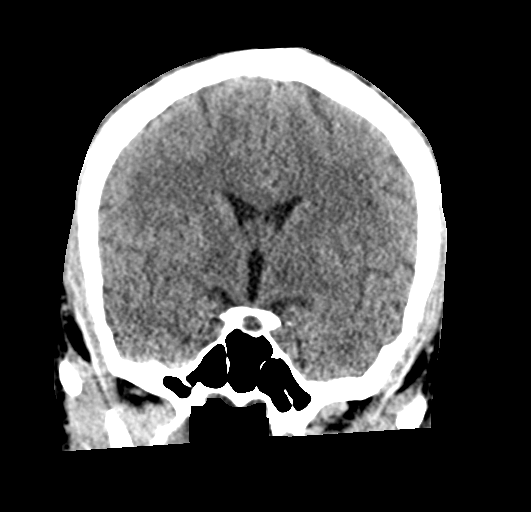

[Series 7: head without sag · sagittal · non-contrast · 0.38mm/px · 3 of 63 slices shown]
[im 21/63  brain]
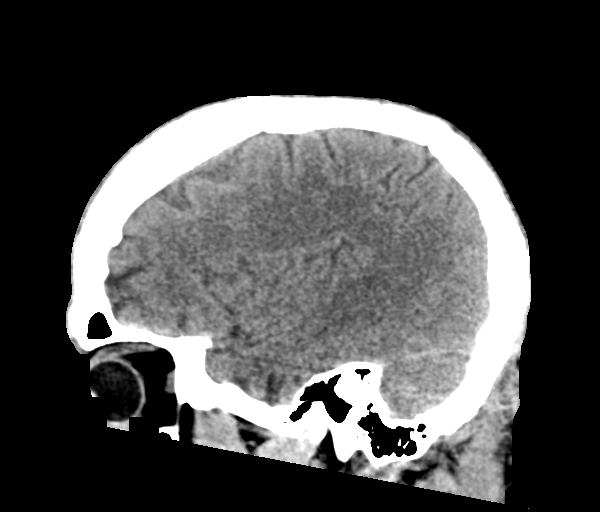
[im 32/63  brain]
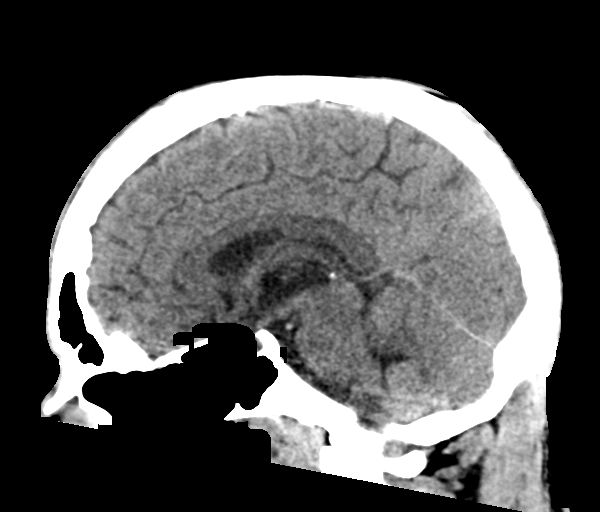
[im 42/63  brain]
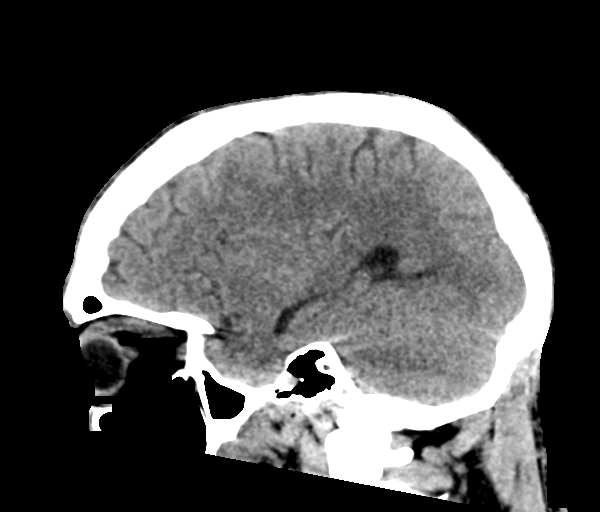

[17 of 47 positions shown; findings below may reference images not displayed]

FINDINGS: Brain: No evidence of acute infarction, hemorrhage, hydrocephalus,
extra-axial collection or mass lesion/mass effect.

Vascular: No hyperdense vessel or unexpected calcification.

Skull: Normal. Negative for fracture or focal lesion.

Sinuses/Orbits: No acute finding.

Other: None.
IMPRESSION: Normal head CT.
